# Patient Record
Sex: Female | Born: 1940
Health system: Southern US, Community
[De-identification: ages and names within clinical notes are randomized; demographics above are authoritative.]

## PROBLEM LIST (undated history)

## (undated) DIAGNOSIS — R17 Unspecified jaundice: Secondary | ICD-10-CM

## (undated) DIAGNOSIS — M109 Gout, unspecified: Secondary | ICD-10-CM

## (undated) DIAGNOSIS — N2889 Other specified disorders of kidney and ureter: Secondary | ICD-10-CM

## (undated) DIAGNOSIS — N183 Chronic kidney disease, stage 3 unspecified: Secondary | ICD-10-CM

## (undated) DIAGNOSIS — K5790 Diverticulosis of intestine, part unspecified, without perforation or abscess without bleeding: Secondary | ICD-10-CM

## (undated) DIAGNOSIS — C801 Malignant (primary) neoplasm, unspecified: Secondary | ICD-10-CM

## (undated) DIAGNOSIS — K648 Other hemorrhoids: Secondary | ICD-10-CM

## (undated) DIAGNOSIS — I219 Acute myocardial infarction, unspecified: Secondary | ICD-10-CM

## (undated) DIAGNOSIS — I2510 Atherosclerotic heart disease of native coronary artery without angina pectoris: Secondary | ICD-10-CM

## (undated) DIAGNOSIS — R112 Nausea with vomiting, unspecified: Secondary | ICD-10-CM

## (undated) DIAGNOSIS — Z8719 Personal history of other diseases of the digestive system: Secondary | ICD-10-CM

## (undated) DIAGNOSIS — Z862 Personal history of diseases of the blood and blood-forming organs and certain disorders involving the immune mechanism: Secondary | ICD-10-CM

## (undated) DIAGNOSIS — N840 Polyp of corpus uteri: Secondary | ICD-10-CM

## (undated) DIAGNOSIS — E785 Hyperlipidemia, unspecified: Secondary | ICD-10-CM

## (undated) DIAGNOSIS — M1990 Unspecified osteoarthritis, unspecified site: Secondary | ICD-10-CM

## (undated) DIAGNOSIS — G709 Myoneural disorder, unspecified: Secondary | ICD-10-CM

## (undated) DIAGNOSIS — M25511 Pain in right shoulder: Secondary | ICD-10-CM

## (undated) DIAGNOSIS — M25512 Pain in left shoulder: Secondary | ICD-10-CM

## (undated) DIAGNOSIS — N819 Female genital prolapse, unspecified: Secondary | ICD-10-CM

## (undated) DIAGNOSIS — I493 Ventricular premature depolarization: Secondary | ICD-10-CM

## (undated) DIAGNOSIS — N950 Postmenopausal bleeding: Secondary | ICD-10-CM

## (undated) DIAGNOSIS — K644 Residual hemorrhoidal skin tags: Secondary | ICD-10-CM

## (undated) DIAGNOSIS — Z923 Personal history of irradiation: Secondary | ICD-10-CM

## (undated) DIAGNOSIS — R569 Unspecified convulsions: Secondary | ICD-10-CM

## (undated) DIAGNOSIS — E119 Type 2 diabetes mellitus without complications: Secondary | ICD-10-CM

## (undated) DIAGNOSIS — Z9889 Other specified postprocedural states: Secondary | ICD-10-CM

## (undated) DIAGNOSIS — E559 Vitamin D deficiency, unspecified: Secondary | ICD-10-CM

## (undated) DIAGNOSIS — I10 Essential (primary) hypertension: Secondary | ICD-10-CM

## (undated) DIAGNOSIS — W57XXXA Bitten or stung by nonvenomous insect and other nonvenomous arthropods, initial encounter: Secondary | ICD-10-CM

## (undated) HISTORY — DX: Unspecified osteoarthritis, unspecified site: M19.90

## (undated) HISTORY — DX: Atherosclerotic heart disease of native coronary artery without angina pectoris: I25.10

## (undated) HISTORY — DX: Diverticulosis of intestine, part unspecified, without perforation or abscess without bleeding: K57.90

## (undated) HISTORY — DX: Essential (primary) hypertension: I10

## (undated) HISTORY — DX: Hereditary hemochromatosis: E83.110

## (undated) HISTORY — PX: LIPOMA EXCISION: SHX5283

## (undated) HISTORY — DX: Type 2 diabetes mellitus without complications: E11.9

## (undated) HISTORY — DX: Hyperlipidemia, unspecified: E78.5

## (undated) HISTORY — DX: Malignant (primary) neoplasm, unspecified: C80.1

## (undated) HISTORY — DX: Gout, unspecified: M10.9

## (undated) HISTORY — DX: Myoneural disorder, unspecified: G70.9

## (undated) HISTORY — PX: BREAST SURGERY: SHX581

---

## 1988-03-23 DIAGNOSIS — Z8719 Personal history of other diseases of the digestive system: Secondary | ICD-10-CM

## 1988-03-23 HISTORY — DX: Personal history of other diseases of the digestive system: Z87.19

## 1989-01-20 HISTORY — PX: UPPER GI ENDOSCOPY: SHX6162

## 2001-09-01 ENCOUNTER — Other Ambulatory Visit: Admission: RE | Admit: 2001-09-01 | Discharge: 2001-09-01 | Payer: Self-pay | Admitting: Family Medicine

## 2001-11-28 HISTORY — PX: COLONOSCOPY: SHX174

## 2003-09-10 ENCOUNTER — Ambulatory Visit (HOSPITAL_COMMUNITY): Admission: RE | Admit: 2003-09-10 | Discharge: 2003-09-10 | Payer: Self-pay | Admitting: Family Medicine

## 2004-12-17 ENCOUNTER — Other Ambulatory Visit: Admission: RE | Admit: 2004-12-17 | Discharge: 2004-12-17 | Payer: Self-pay | Admitting: Family Medicine

## 2005-03-23 DIAGNOSIS — I219 Acute myocardial infarction, unspecified: Secondary | ICD-10-CM

## 2005-03-23 HISTORY — DX: Acute myocardial infarction, unspecified: I21.9

## 2005-03-23 HISTORY — PX: CORONARY ARTERY BYPASS GRAFT: SHX141

## 2005-06-03 ENCOUNTER — Inpatient Hospital Stay (HOSPITAL_COMMUNITY): Admission: EM | Admit: 2005-06-03 | Discharge: 2005-06-09 | Payer: Self-pay | Admitting: *Deleted

## 2005-06-03 HISTORY — PX: CARDIAC CATHETERIZATION: SHX172

## 2005-06-29 ENCOUNTER — Encounter
Admission: RE | Admit: 2005-06-29 | Discharge: 2005-06-29 | Payer: Self-pay | Admitting: Thoracic Surgery (Cardiothoracic Vascular Surgery)

## 2005-07-13 ENCOUNTER — Encounter (HOSPITAL_COMMUNITY): Admission: RE | Admit: 2005-07-13 | Discharge: 2005-08-12 | Payer: Self-pay | Admitting: Cardiology

## 2005-08-19 ENCOUNTER — Encounter (HOSPITAL_COMMUNITY): Admission: RE | Admit: 2005-08-19 | Discharge: 2005-09-18 | Payer: Self-pay | Admitting: Cardiology

## 2005-09-21 ENCOUNTER — Encounter (HOSPITAL_COMMUNITY): Admission: RE | Admit: 2005-09-21 | Discharge: 2005-10-21 | Payer: Self-pay | Admitting: Cardiology

## 2011-05-27 ENCOUNTER — Ambulatory Visit: Payer: Medicare Other | Attending: Orthopedic Surgery | Admitting: Physical Therapy

## 2011-05-27 DIAGNOSIS — IMO0001 Reserved for inherently not codable concepts without codable children: Secondary | ICD-10-CM | POA: Insufficient documentation

## 2011-05-27 DIAGNOSIS — M25559 Pain in unspecified hip: Secondary | ICD-10-CM | POA: Insufficient documentation

## 2011-05-27 DIAGNOSIS — R5381 Other malaise: Secondary | ICD-10-CM | POA: Insufficient documentation

## 2011-05-28 ENCOUNTER — Ambulatory Visit: Payer: Medicare Other | Admitting: Physical Therapy

## 2011-06-01 ENCOUNTER — Ambulatory Visit: Payer: Medicare Other | Admitting: Physical Therapy

## 2011-06-09 ENCOUNTER — Ambulatory Visit: Payer: Medicare Other | Admitting: Physical Therapy

## 2011-06-11 ENCOUNTER — Ambulatory Visit: Payer: Medicare Other | Admitting: Physical Therapy

## 2011-06-16 ENCOUNTER — Ambulatory Visit: Payer: Medicare Other | Admitting: Physical Therapy

## 2011-06-18 ENCOUNTER — Ambulatory Visit: Payer: Medicare Other | Admitting: Physical Therapy

## 2011-06-22 ENCOUNTER — Ambulatory Visit: Payer: Medicare Other | Attending: Orthopedic Surgery | Admitting: Physical Therapy

## 2011-06-22 DIAGNOSIS — M25559 Pain in unspecified hip: Secondary | ICD-10-CM | POA: Insufficient documentation

## 2011-06-22 DIAGNOSIS — IMO0001 Reserved for inherently not codable concepts without codable children: Secondary | ICD-10-CM | POA: Insufficient documentation

## 2011-06-22 DIAGNOSIS — R5381 Other malaise: Secondary | ICD-10-CM | POA: Insufficient documentation

## 2011-06-25 ENCOUNTER — Ambulatory Visit: Payer: Medicare Other | Admitting: Physical Therapy

## 2011-06-29 ENCOUNTER — Ambulatory Visit: Payer: Medicare Other | Admitting: Physical Therapy

## 2011-07-01 ENCOUNTER — Ambulatory Visit: Payer: Medicare Other | Admitting: Physical Therapy

## 2011-07-06 ENCOUNTER — Ambulatory Visit: Payer: Medicare Other | Admitting: Physical Therapy

## 2011-07-08 ENCOUNTER — Ambulatory Visit: Payer: Medicare Other | Admitting: Physical Therapy

## 2011-07-09 ENCOUNTER — Encounter: Payer: Medicare Other | Admitting: Physical Therapy

## 2011-07-13 ENCOUNTER — Ambulatory Visit: Payer: Medicare Other | Admitting: Physical Therapy

## 2011-07-17 ENCOUNTER — Ambulatory Visit: Payer: Medicare Other | Admitting: Physical Therapy

## 2012-06-13 ENCOUNTER — Other Ambulatory Visit (INDEPENDENT_AMBULATORY_CARE_PROVIDER_SITE_OTHER): Payer: Medicare Other

## 2012-06-13 DIAGNOSIS — E1059 Type 1 diabetes mellitus with other circulatory complications: Secondary | ICD-10-CM

## 2012-06-13 DIAGNOSIS — E785 Hyperlipidemia, unspecified: Secondary | ICD-10-CM

## 2012-06-13 DIAGNOSIS — I10 Essential (primary) hypertension: Secondary | ICD-10-CM

## 2012-06-13 LAB — HEPATIC FUNCTION PANEL
ALT: 35 U/L (ref 0–35)
AST: 31 U/L (ref 0–37)
Albumin: 4.5 g/dL (ref 3.5–5.2)
Alkaline Phosphatase: 60 U/L (ref 39–117)
Bilirubin, Direct: 0.1 mg/dL (ref 0.0–0.3)
Indirect Bilirubin: 0.5 mg/dL (ref 0.0–0.9)
Total Bilirubin: 0.6 mg/dL (ref 0.3–1.2)
Total Protein: 7.5 g/dL (ref 6.0–8.3)

## 2012-06-13 LAB — BASIC METABOLIC PANEL
BUN: 23 mg/dL (ref 6–23)
CO2: 29 mEq/L (ref 19–32)
Calcium: 10.4 mg/dL (ref 8.4–10.5)
Chloride: 101 mEq/L (ref 96–112)
Creat: 1.5 mg/dL — ABNORMAL HIGH (ref 0.50–1.10)
Glucose, Bld: 142 mg/dL — ABNORMAL HIGH (ref 70–99)
Potassium: 4.1 mEq/L (ref 3.5–5.3)
Sodium: 140 mEq/L (ref 135–145)

## 2012-06-13 LAB — POCT GLYCOSYLATED HEMOGLOBIN (HGB A1C): Hemoglobin A1C: 6.2

## 2012-06-13 NOTE — Progress Notes (Signed)
Patient came in for labs only.

## 2012-06-14 LAB — NMR, LIPOPROFILE

## 2012-06-20 ENCOUNTER — Encounter: Payer: Self-pay | Admitting: Family Medicine

## 2012-06-20 ENCOUNTER — Ambulatory Visit (INDEPENDENT_AMBULATORY_CARE_PROVIDER_SITE_OTHER): Payer: Medicare Other | Admitting: Family Medicine

## 2012-06-20 VITALS — BP 151/79 | HR 60 | Temp 97.5°F | Ht 64.0 in | Wt 194.6 lb

## 2012-06-20 DIAGNOSIS — E1149 Type 2 diabetes mellitus with other diabetic neurological complication: Secondary | ICD-10-CM | POA: Insufficient documentation

## 2012-06-20 DIAGNOSIS — E119 Type 2 diabetes mellitus without complications: Secondary | ICD-10-CM

## 2012-06-20 DIAGNOSIS — E785 Hyperlipidemia, unspecified: Secondary | ICD-10-CM | POA: Insufficient documentation

## 2012-06-20 DIAGNOSIS — I2510 Atherosclerotic heart disease of native coronary artery without angina pectoris: Secondary | ICD-10-CM

## 2012-06-20 DIAGNOSIS — M129 Arthropathy, unspecified: Secondary | ICD-10-CM

## 2012-06-20 DIAGNOSIS — R599 Enlarged lymph nodes, unspecified: Secondary | ICD-10-CM

## 2012-06-20 DIAGNOSIS — R590 Localized enlarged lymph nodes: Secondary | ICD-10-CM

## 2012-06-20 DIAGNOSIS — I10 Essential (primary) hypertension: Secondary | ICD-10-CM

## 2012-06-20 DIAGNOSIS — I152 Hypertension secondary to endocrine disorders: Secondary | ICD-10-CM | POA: Insufficient documentation

## 2012-06-20 DIAGNOSIS — M109 Gout, unspecified: Secondary | ICD-10-CM | POA: Insufficient documentation

## 2012-06-20 DIAGNOSIS — E1159 Type 2 diabetes mellitus with other circulatory complications: Secondary | ICD-10-CM | POA: Insufficient documentation

## 2012-06-20 DIAGNOSIS — M1990 Unspecified osteoarthritis, unspecified site: Secondary | ICD-10-CM

## 2012-06-20 DIAGNOSIS — E559 Vitamin D deficiency, unspecified: Secondary | ICD-10-CM

## 2012-06-20 LAB — URIC ACID: Uric Acid, Serum: 5.7 mg/dL (ref 2.4–6.0)

## 2012-06-20 LAB — POCT CBC
Granulocyte percent: 59.7 %G (ref 37–80)
HCT, POC: 43.2 % (ref 37.7–47.9)
Hemoglobin: 14.6 g/dL (ref 12.2–16.2)
Lymph, poc: 4.3 — AB (ref 0.6–3.4)
MCH, POC: 31.4 pg — AB (ref 27–31.2)
MCHC: 33.9 g/dL (ref 31.8–35.4)
MCV: 92.8 fL (ref 80–97)
MPV: 9.6 fL (ref 0–99.8)
POC Granulocyte: 6.9 (ref 2–6.9)
POC LYMPH PERCENT: 36.9 %L (ref 10–50)
Platelet Count, POC: 139 10*3/uL — AB (ref 142–424)
RBC: 4.7 M/uL (ref 4.04–5.48)
RDW, POC: 13.8 %
WBC: 11.6 10*3/uL — AB (ref 4.6–10.2)

## 2012-06-20 LAB — POCT GLYCOSYLATED HEMOGLOBIN (HGB A1C): Hemoglobin A1C: 6.1

## 2012-06-20 MED ORDER — AMOXICILLIN 875 MG PO TABS: 875.0000 mg | ORAL_TABLET | Freq: Two times a day (BID) | ORAL | Status: DC

## 2012-06-20 NOTE — Progress Notes (Signed)
Patient ID: Carmen [name], female   DOB: 1940/08/18, 72 y.o.   MRN: [id number] SUBJECTIVE:   HPI: Patient comes in for routine 4 month followup she is intolerant to statin sequence and the Crestor and stopped it had to go to chiropractor from physical therapy because her muscles hurt so bad. She was on fenofibrate. She is willing to restart fenofibrate but not the statin. Coronary artery disease: No chest pain no shortness of breath no orthopnea. Hypertension no headache chest pain. Diabetes: Her sugars been running fairly satisfactory about 110-120 morning. Arthritis ongoing problem.  Past Medical History  Diagnosis Date  . Anemia   . Hypertension   . Arthritis   . Hyperlipidemia   . Diverticulosis   . Gout   . CAD (coronary artery disease)   . Diabetes mellitus without complication    Past Surgical History  Procedure Laterality Date  . Lipoma excision      back  . Breast surgery      left breast lump--benign  . Coronary artery bypass graft     History   Social History  . Marital Status: Married    Spouse Name: N/A    Number of Children: N/A  . Years of Education: N/A   Occupational History  . Not on file.   Social History Main Topics  . Smoking status: Never Smoker   . Smokeless tobacco: Not on file  . Alcohol Use: No  . Drug Use: No  . Sexually Active: Not on file   Other Topics Concern  . Not on file   Social History Narrative  . No narrative on file   History reviewed. No pertinent family history. No current outpatient prescriptions on file prior to visit.   No current facility-administered medications on file prior to visit.   Allergies  Allergen Reactions  . Crestor (Rosuvastatin)     Muscle aches    . Zocor (Simvastatin)     Muscle aches   . Sulfa Antibiotics Rash    There is no immunization history on file for this patient. Prior to Admission medications   Medication Sig Start Date End Date Taking? Authorizing Provider  allopurinol  (ZYLOPRIM) 100 MG tablet Take 100 mg by mouth 2 (two) times daily.  05/30/12  Yes Historical Provider, MD  DIOVAN 160 MG tablet Take 160 mg by mouth daily.  06/14/12  Yes Historical Provider, MD  fluticasone (FLONASE) 50 MCG/ACT nasal spray Place 2 sprays into the nose daily.  05/16/12  Yes Historical Provider, MD  JANUVIA 100 MG tablet Take 100 mg by mouth daily.  05/27/12  Yes Historical Provider, MD  LOVAZA 1 G capsule Take 1 g by mouth 2 (two) times daily.  05/14/12  Yes Historical Provider, MD  LYRICA 50 MG capsule Take 50 mg by mouth 2 (two) times daily.  05/27/12  Yes Historical Provider, MD  metoprolol tartrate (LOPRESSOR) 25 MG tablet Take 25 mg by mouth 2 (two) times daily.  04/10/12  Yes Historical Provider, MD  triamterene-hydrochlorothiazide (DYAZIDE) 37.5-25 MG per capsule Take 1 capsule by mouth daily.  06/14/12  Yes Historical Provider, MD   Review of systems as above  OBJECTIVE:    Patient in no acute distress. Obese VS:BP 151/79  Pulse 60  Temp(Src) 97.5 F (36.4 C) (Oral)  Ht 5\' 4"  (1.626 m)  Wt 194 lb 9.6 oz (88.27 kg)  BMI 33.39 kg/m2  SKIN: warm and  Dry without overt rashes. HEAD and Neck: without JVD, Normal except for one  cervical lymph node less than a centimeter on the left side of the neck very tender. Patient had recent dental work. LUNGS: Clear CVS: Regular rhythm, normal sounds, and absence of murmurs, rubs or gallops. ABDOMEN: Benign,, no organomegaly, no masses, no Abdominal Aortic enlargement. EXTREMETIES: nonedematous. NEUROLOGIC: oriented to time,place and person; nonfocal  ASSESSMENT:   HLD (hyperlipidemia) Patient is intolerant to statins. A bad muscle aches she could hardly get around. She and her going to the chiropractor for treatment and physical therapy. She refuses to take any Crestor. She will consider restarting fenofibrate. She has 2 months prescription for fenofibrate. Reviewed her labs with her t LDL particles was 3246. The LDL cholesterol was  165. HDL cholesterol was 34. The triglyceride was 393. The total cholesterol was 278. In view of her coronary artery disease and coronary artery bypass graft history she needs to be aggressive lowering with medications and aggressive dietary changes to lower the LDL cholesterol and triglyceride  DM (diabetes mellitus) Hemoglobin A1c was satisfactory.  HTN (hypertension) Blood pressure level  high patient claims that her blood pressure is usually normal at home  Gout Uric acid was not done on her labs will do that today  Arthritis She  gets arthritic joint aches and pains.   Acute cervical lymphadenitis. PLAN:  Results for orders placed in visit on 06/20/12  POCT CBC      Result Value Range   WBC 11.6 (*) 4.6 - 10.2 K/uL   Lymph, poc 4.3 (*) 0.6 - 3.4   POC LYMPH PERCENT 36.9  10 - 50 %L   MID (cbc)    0 - 0.9   POC MID %    0 - 12 %M   POC Granulocyte 6.9  2 - 6.9   Granulocyte percent 59.7  37 - 80 %G   RBC 4.7  4.04 - 5.48 M/uL   Hemoglobin 14.6  12.2 - 16.2 g/dL   HCT, POC 19.1  47.8 - 47.9 %   MCV 92.8  80 - 97 fL   MCH, POC 31.4 (*) 27 - 31.2 pg   MCHC 33.9  31.8 - 35.4 g/dL   RDW, POC 29.5     Platelet Count, POC 139.0 (*) 142 - 424 K/uL   MPV 9.6  0 - 99.8 fL   Orders Placed This Encounter  Procedures  . Uric acid  . POCT CBC   Meds ordered this encounter  Medications  . allopurinol (ZYLOPRIM) 100 MG tablet    Sig: Take 100 mg by mouth 2 (two) times daily.   . fluticasone (FLONASE) 50 MCG/ACT nasal spray    Sig: Place 2 sprays into the nose daily.   . metoprolol tartrate (LOPRESSOR) 25 MG tablet    Sig: Take 25 mg by mouth 2 (two) times daily.   Marland Kitchen LOVAZA 1 G capsule    Sig: Take 1 g by mouth 2 (two) times daily.   Marland Kitchen LYRICA 50 MG capsule    Sig: Take 50 mg by mouth 2 (two) times daily.   Marland Kitchen DISCONTD: CRESTOR 10 MG tablet    Sig:   . JANUVIA 100 MG tablet    Sig: Take 100 mg by mouth daily.   Marland Kitchen triamterene-hydrochlorothiazide (DYAZIDE) 37.5-25 MG per  capsule    Sig: Take 1 capsule by mouth daily.   Marland Kitchen DIOVAN 160 MG tablet    Sig: Take 160 mg by mouth daily.    Recommended for patient to restart fenofibrate. Will need to check  pharmacy in regards to what the actual doses. Diet and exercise recommended.  Lorilyn Laitinen P. [place] Charon, M.D. Addendum: Because of recent dental work and in lymphadenopathy in left side of her neck will call in amoxicillin 875 mg by mouth twice a day for 10 days. Prescription sent to the pharmacist to [facility]

## 2012-06-20 NOTE — Assessment & Plan Note (Signed)
Hemoglobin A1c was satisfactory.

## 2012-06-20 NOTE — Assessment & Plan Note (Signed)
Uric acid was not done on her labs will do that today

## 2012-06-20 NOTE — Assessment & Plan Note (Signed)
Blood pressure level  high patient claims that her blood pressure is usually normal at home

## 2012-06-20 NOTE — Assessment & Plan Note (Signed)
Patient is intolerant to statins. A bad muscle aches she could hardly get around. She and her going to the chiropractor for treatment and physical therapy. She refuses to take any Crestor. She will consider restarting fenofibrate. She has 2 months prescription for fenofibrate. Reviewed her labs with her t LDL particles was 3246. The LDL cholesterol was 165. HDL cholesterol was 34. The triglyceride was 393. The total cholesterol was 278. In view of her coronary artery disease and coronary artery bypass graft history she needs to be aggressive lowering with medications and aggressive dietary changes to lower the LDL cholesterol and triglyceride

## 2012-06-20 NOTE — Assessment & Plan Note (Signed)
She  gets arthritic joint aches and pains.

## 2012-06-21 ENCOUNTER — Telehealth: Payer: Self-pay

## 2012-06-21 NOTE — Telephone Encounter (Signed)
[facility] not[facility] and confirmed  pt is on fenfofibrate 54 mg

## 2012-06-21 NOTE — Progress Notes (Signed)
Quick Note:  Lab result at goal for the HGBA1C and the Uric acid. No change in Medications for now. The WBC was slightly elevated and reflects an infection, therefore the amoxil was prescribed for an infection from the dental problem and the swollen Lymph node. We should recheck the CBC in 2 weeks. ______

## 2012-07-04 ENCOUNTER — Other Ambulatory Visit (INDEPENDENT_AMBULATORY_CARE_PROVIDER_SITE_OTHER): Payer: Medicare Other

## 2012-07-04 DIAGNOSIS — R5383 Other fatigue: Secondary | ICD-10-CM

## 2012-07-04 DIAGNOSIS — R5381 Other malaise: Secondary | ICD-10-CM

## 2012-07-04 LAB — POCT CBC
Granulocyte percent: 51.4 %G (ref 37–80)
HCT, POC: 41.4 % (ref 37.7–47.9)
Hemoglobin: 13.6 g/dL (ref 12.2–16.2)
Lymph, poc: 4.1 — AB (ref 0.6–3.4)
MCH, POC: 30.1 pg (ref 27–31.2)
MCHC: 32.9 g/dL (ref 31.8–35.4)
MCV: 91.4 fL (ref 80–97)
MPV: 7.9 fL (ref 0–99.8)
POC Granulocyte: 4.8 (ref 2–6.9)
POC LYMPH PERCENT: 43.6 %L (ref 10–50)
Platelet Count, POC: 142 10*3/uL (ref 142–424)
RBC: 4.5 M/uL (ref 4.04–5.48)
RDW, POC: 13.9 %
WBC: 9.4 10*3/uL (ref 4.6–10.2)

## 2012-07-04 NOTE — Progress Notes (Signed)
Quick Note:  Call patient. Labs normal. No change in plan. ______ 

## 2012-07-19 ENCOUNTER — Other Ambulatory Visit: Payer: Self-pay | Admitting: *Deleted

## 2012-07-19 MED ORDER — OMEGA-3-ACID ETHYL ESTERS 1 G PO CAPS: ORAL_CAPSULE | ORAL | Status: DC

## 2012-07-28 ENCOUNTER — Telehealth: Payer: Self-pay | Admitting: Family Medicine

## 2012-07-28 NOTE — Telephone Encounter (Signed)
Pt aware labs sent to [facility]

## 2012-08-01 ENCOUNTER — Other Ambulatory Visit: Payer: Self-pay | Admitting: Family Medicine

## 2012-08-03 ENCOUNTER — Other Ambulatory Visit: Payer: Self-pay | Admitting: Nurse Practitioner

## 2012-08-05 ENCOUNTER — Other Ambulatory Visit: Payer: Self-pay | Admitting: Nurse Practitioner

## 2012-08-08 ENCOUNTER — Encounter: Payer: Self-pay | Admitting: Internal Medicine

## 2012-08-09 ENCOUNTER — Ambulatory Visit (INDEPENDENT_AMBULATORY_CARE_PROVIDER_SITE_OTHER): Payer: Medicare Other | Admitting: Internal Medicine

## 2012-08-09 ENCOUNTER — Encounter: Payer: Self-pay | Admitting: Internal Medicine

## 2012-08-09 VITALS — BP 148/88 | HR 64 | Ht 60.0 in | Wt 197.0 lb

## 2012-08-09 DIAGNOSIS — I493 Ventricular premature depolarization: Secondary | ICD-10-CM | POA: Insufficient documentation

## 2012-08-09 DIAGNOSIS — Z951 Presence of aortocoronary bypass graft: Secondary | ICD-10-CM | POA: Insufficient documentation

## 2012-08-09 DIAGNOSIS — E785 Hyperlipidemia, unspecified: Secondary | ICD-10-CM

## 2012-08-09 DIAGNOSIS — I2510 Atherosclerotic heart disease of native coronary artery without angina pectoris: Secondary | ICD-10-CM

## 2012-08-09 DIAGNOSIS — I10 Essential (primary) hypertension: Secondary | ICD-10-CM

## 2012-08-09 DIAGNOSIS — I4949 Other premature depolarization: Secondary | ICD-10-CM

## 2012-08-09 NOTE — Patient Instructions (Signed)
Your physician recommends that you schedule a follow-up appointment in: 1 year  

## 2012-08-09 NOTE — Progress Notes (Signed)
OFFICE NOTE  Chief Complaint:  Annual office visit  Primary Care Physician: Carmen Heap, MD  HPI:  Carmen [name] is a 72 year-old female comes in for followup of coronary disease. She had bypass surgery in 2007 and at that time had a 40% ejection fraction. She had a nuclear study in May of 2012 that was a low-risk study, but we could not calculate an ejection fraction because of ectopy. She has had no episodes of angina, no unusual shortness of breath unless she walks up a hill or a flight of steps, and then she becomes mildly breathless. She does not have PND or orthopnea. She does not have a productive cough. She has had no awareness of any arrhythmias, no dizziness, no syncope. She does have mild left ankle edema that is dependent in nature. She wears support hose infrequently for this. She has recurrent problems with gout, usually in her great toe. She has made some significant dietary changes, and the gout episodes seem to be less.  She has been diabetic for around 2 years. She has a hemoglobin A1c of 5.8, has had no significant episodes of hypoglycemia. She has noted some proximal lower extremity weakness. She gave an example working in her garden, when she tried to get up her legs were so weak that she had to get up on all fours, and one time her husband had to get her up. She has gone through physical therapy recently because of right hip bursitis, and it may very well be that she needs additional physical therapy for proximal muscle weakness and core strength building. No significant problems from the pollen. She has had some problems with constipation. Otherwise, no new complaints today.  PMHx:  Past Medical History  Diagnosis Date  . Anemia   . Hypertension   . Arthritis   . Hyperlipidemia   . Diverticulosis   . Gout   . Diabetes mellitus without complication   . CAD (coronary artery disease)     Past Surgical History  Procedure Laterality Date  . Lipoma excision     back  . Breast surgery      left breast lump--benign  . Cardiac catheterization  06/03/2005  . Coronary artery bypass graft  2007    x2    FAMHx:  History reviewed. No pertinent family history.  SOCHx:   reports that she has never smoked. She does not have any smokeless tobacco history on file. She reports that she does not drink alcohol or use illicit drugs.  ALLERGIES:  Allergies  Allergen Reactions  . Crestor (Rosuvastatin)     Muscle aches    . Zetia (Ezetimibe)   . Zocor (Simvastatin)     Muscle aches   . Sulfa Antibiotics Rash    ROS: A comprehensive review of systems was negative except for: Musculoskeletal: positive for back pain and muscle weakness  HOME MEDS: Current Outpatient Prescriptions  Medication Sig Dispense Refill  . allopurinol (ZYLOPRIM) 100 MG tablet Take 100 mg by mouth 2 (two) times daily.       Marland Kitchen aspirin 81 MG tablet Take 81 mg by mouth daily.      Marland Kitchen DIOVAN 160 MG tablet Take 160 mg by mouth daily.       . fenofibrate 54 MG tablet Take 54 mg by mouth daily.      . fluticasone (FLONASE) 50 MCG/ACT nasal spray USE 2 SPRAYS INTO EACH NOSTRIL DAILY  16 g  2  . JANUVIA 100 MG tablet Take  100 mg by mouth daily.       Marland Kitchen LYRICA 50 MG capsule TAKE 1 CAPSULE TWICE A DAY  60 capsule  2  . metoprolol tartrate (LOPRESSOR) 25 MG tablet Take 25 mg by mouth 2 (two) times daily.       Marland Kitchen omega-3 acid ethyl esters (LOVAZA) 1 G capsule Take 2 capsules twice a day  120 capsule  2  . triamterene-hydrochlorothiazide (DYAZIDE) 37.5-25 MG per capsule Take 1 capsule by mouth daily.       Marland Kitchen VITAMIN D, CHOLECALCIFEROL, PO Take 5,000 Units by mouth daily.      Marland Kitchen amoxicillin (AMOXIL) 875 MG tablet Take 1 tablet (875 mg total) by mouth 2 (two) times daily.  20 tablet  0   No current facility-administered medications for this visit.    LABS/IMAGING: No results found for this or any previous visit (from the past 48 hour(s)). No results found.  VITALS: BP 148/88  Pulse 64   Ht 5' (1.524 m)  Wt 197 lb (89.359 kg)  BMI 38.47 kg/m2  EXAM: General appearance: alert and no distress Neck: no adenopathy, no carotid bruit, no JVD, supple, symmetrical, trachea midline and thyroid not enlarged, symmetric, no tenderness/mass/nodules Lungs: clear to auscultation bilaterally Heart: regular rate and rhythm, S1, S2 normal, no murmur, click, rub or gallop Abdomen: soft, non-tender; bowel sounds normal; no masses,  no organomegaly Extremities: extremities normal, atraumatic, no cyanosis or edema Pulses: 2+ and symmetric Skin: Skin color, texture, turgor normal. No rashes or lesions Neurologic: Grossly normal  EKG: Sinus rhythm with occasional PVCs at 64  ASSESSMENT: 1. Coronary artery bypass grafting x2 vessels with LIMA to LAD and SVG to PDA in 2007 2. Asymptomatic PVCs 3. Hypertension-controlled 4. Dyslipidemia 5. Diabetes 6. Gout  PLAN: 1.   Overall Carmen [name] is doing fairly well. Her main complaint again continue around low back pain and sciatica and weakness in her legs. She underwent rehabilitation for this but she reports when bending over in her garden at times she feels unstable like she might fall. She denies any chest pain or worsening shortness of breath. She is unaware of her PVCs. We'll go ahead and check a lipid profile again today. Plan to see her back in the office annually or sooner as necessary.  Carmen Nose, MD, [facility] Attending Cardiologist [facility]  Carmen [name] 08/09/2012, 2:24 PM

## 2012-08-18 ENCOUNTER — Ambulatory Visit: Payer: Medicare Other | Admitting: Family Medicine

## 2012-08-22 ENCOUNTER — Other Ambulatory Visit: Payer: Self-pay | Admitting: Family Medicine

## 2012-09-18 ENCOUNTER — Other Ambulatory Visit: Payer: Self-pay | Admitting: Family Medicine

## 2012-09-19 ENCOUNTER — Other Ambulatory Visit: Payer: Self-pay | Admitting: *Deleted

## 2012-09-19 MED ORDER — SITAGLIPTIN PHOSPHATE 100 MG PO TABS: 100.0000 mg | ORAL_TABLET | Freq: Every day | ORAL | Status: DC

## 2012-09-19 NOTE — Telephone Encounter (Signed)
LAST OV 06/20/12 AND LAST AIC AT THIS APPT. THANKS.

## 2012-10-10 ENCOUNTER — Other Ambulatory Visit: Payer: Self-pay | Admitting: *Deleted

## 2012-10-10 ENCOUNTER — Other Ambulatory Visit: Payer: Self-pay | Admitting: Family Medicine

## 2012-10-10 MED ORDER — METOPROLOL TARTRATE 25 MG PO TABS: 25.0000 mg | ORAL_TABLET | Freq: Two times a day (BID) | ORAL | Status: DC

## 2012-10-11 ENCOUNTER — Telehealth: Payer: Self-pay | Admitting: Family Medicine

## 2012-10-11 NOTE — Telephone Encounter (Signed)
Left message for pt that labs will be drawn day of appt

## 2012-10-12 ENCOUNTER — Other Ambulatory Visit: Payer: Self-pay

## 2012-10-15 ENCOUNTER — Telehealth: Payer: Self-pay | Admitting: Family Medicine

## 2012-10-15 ENCOUNTER — Other Ambulatory Visit: Payer: Self-pay | Admitting: Nurse Practitioner

## 2012-10-15 MED ORDER — ALLOPURINOL 100 MG PO TABS: 100.0000 mg | ORAL_TABLET | Freq: Two times a day (BID) | ORAL | Status: DC

## 2012-10-15 NOTE — Telephone Encounter (Signed)
Med rf'd x 1 mo

## 2012-10-17 ENCOUNTER — Other Ambulatory Visit: Payer: Self-pay | Admitting: Family Medicine

## 2012-10-19 ENCOUNTER — Other Ambulatory Visit: Payer: Self-pay | Admitting: Nurse Practitioner

## 2012-10-20 ENCOUNTER — Ambulatory Visit (INDEPENDENT_AMBULATORY_CARE_PROVIDER_SITE_OTHER): Payer: Medicare Other | Admitting: Family Medicine

## 2012-10-20 ENCOUNTER — Encounter: Payer: Self-pay | Admitting: Family Medicine

## 2012-10-20 ENCOUNTER — Ambulatory Visit (INDEPENDENT_AMBULATORY_CARE_PROVIDER_SITE_OTHER): Payer: Medicare Other

## 2012-10-20 VITALS — BP 151/72 | HR 68 | Temp 97.2°F

## 2012-10-20 DIAGNOSIS — Z951 Presence of aortocoronary bypass graft: Secondary | ICD-10-CM

## 2012-10-20 DIAGNOSIS — R52 Pain, unspecified: Secondary | ICD-10-CM

## 2012-10-20 DIAGNOSIS — I493 Ventricular premature depolarization: Secondary | ICD-10-CM

## 2012-10-20 DIAGNOSIS — I4949 Other premature depolarization: Secondary | ICD-10-CM

## 2012-10-20 DIAGNOSIS — E119 Type 2 diabetes mellitus without complications: Secondary | ICD-10-CM

## 2012-10-20 DIAGNOSIS — M109 Gout, unspecified: Secondary | ICD-10-CM

## 2012-10-20 DIAGNOSIS — E785 Hyperlipidemia, unspecified: Secondary | ICD-10-CM

## 2012-10-20 DIAGNOSIS — I2510 Atherosclerotic heart disease of native coronary artery without angina pectoris: Secondary | ICD-10-CM

## 2012-10-20 DIAGNOSIS — I10 Essential (primary) hypertension: Secondary | ICD-10-CM

## 2012-10-20 DIAGNOSIS — M129 Arthropathy, unspecified: Secondary | ICD-10-CM

## 2012-10-20 DIAGNOSIS — Z8349 Family history of other endocrine, nutritional and metabolic diseases: Secondary | ICD-10-CM

## 2012-10-20 DIAGNOSIS — M1990 Unspecified osteoarthritis, unspecified site: Secondary | ICD-10-CM

## 2012-10-20 LAB — POCT GLYCOSYLATED HEMOGLOBIN (HGB A1C): Hemoglobin A1C: 6

## 2012-10-20 LAB — POCT CBC
Granulocyte percent: 60.6 %G (ref 37–80)
HCT, POC: 42.5 % (ref 37.7–47.9)
Hemoglobin: 14.1 g/dL (ref 12.2–16.2)
Lymph, poc: 3.8 — AB (ref 0.6–3.4)
MCH, POC: 30.4 pg (ref 27–31.2)
MCHC: 33.3 g/dL (ref 31.8–35.4)
MCV: 91.4 fL (ref 80–97)
MPV: 10.1 fL (ref 0–99.8)
POC Granulocyte: 6.5 (ref 2–6.9)
POC LYMPH PERCENT: 35.6 %L (ref 10–50)
Platelet Count, POC: 159 10*3/uL (ref 142–424)
RBC: 4.6 M/uL (ref 4.04–5.48)
RDW, POC: 13.7 %
WBC: 10.7 10*3/uL — AB (ref 4.6–10.2)

## 2012-10-20 LAB — POCT UA - MICROALBUMIN: Microalbumin Ur, POC: NEGATIVE mg/L

## 2012-10-20 MED ORDER — ROSUVASTATIN CALCIUM 5 MG PO TABS: 5.0000 mg | ORAL_TABLET | Freq: Every day | ORAL | Status: DC

## 2012-10-20 MED ORDER — TRIAMTERENE-HCTZ 37.5-25 MG PO CAPS: ORAL_CAPSULE | ORAL | Status: DC

## 2012-10-20 MED ORDER — VALSARTAN 160 MG PO TABS: ORAL_TABLET | ORAL | Status: DC

## 2012-10-20 MED ORDER — SITAGLIPTIN PHOSPHATE 100 MG PO TABS: 100.0000 mg | ORAL_TABLET | Freq: Every day | ORAL | Status: DC

## 2012-10-20 MED ORDER — COLCHICINE 0.6 MG PO TABS: 0.6000 mg | ORAL_TABLET | Freq: Every day | ORAL | Status: DC

## 2012-10-20 MED ORDER — ALLOPURINOL 100 MG PO TABS: 100.0000 mg | ORAL_TABLET | Freq: Two times a day (BID) | ORAL | Status: DC

## 2012-10-20 MED ORDER — FENOFIBRATE 120 MG PO TABS: 54.0000 mg | ORAL_TABLET | Freq: Every day | ORAL | Status: DC

## 2012-10-20 NOTE — Progress Notes (Signed)
Patient ID: Carmen [name], female   DOB: November 27, 1940, 72 y.o.   MRN: [id number] SUBJECTIVE: CC: Chief Complaint  Patient presents with  . Follow-up    4 month follow up c/o gout ck left ankle sore and painful  . Medication Refill    needs refills and wants colcrys     HPI: Patient is here for follow up of Diabetes Mellitus/hypertension/gout/CAD/hyperlipidemia Symptoms of DM: Denies Nocturia ,Denies Urinary Frequency , denies Blurred vision ,deniesDizziness,denies.Dysuria,denies paresthesias, denies extremity pain or ulcers.Marland Kitchendenies chest pain. has had an annual eye exam. do check the feet. Does check CBGs. Average CBG: Denies episodes of hypoglycemia. Does have an emergency hypoglycemic plan. admits toCompliance with medications. Denies Problems with medications.  Past Medical History  Diagnosis Date  . Anemia   . Hypertension   . Arthritis   . Hyperlipidemia   . Diverticulosis   . Gout   . Diabetes mellitus without complication   . CAD (coronary artery disease)    Past Surgical History  Procedure Laterality Date  . Lipoma excision      back  . Breast surgery      left breast lump--benign  . Cardiac catheterization  06/03/2005  . Coronary artery bypass graft  2007    x2   History   Social History  . Marital Status: Married    Spouse Name: N/A    Number of Children: N/A  . Years of Education: N/A   Occupational History  . Not on file.   Social History Main Topics  . Smoking status: Never Smoker   . Smokeless tobacco: Not on file  . Alcohol Use: No  . Drug Use: No  . Sexually Active: Not on file   Other Topics Concern  . Not on file   Social History Narrative  . No narrative on file   No family history on file. Current Outpatient Prescriptions on File Prior to Visit  Medication Sig Dispense Refill  . allopurinol (ZYLOPRIM) 100 MG tablet Take 1 tablet (100 mg total) by mouth 2 (two) times daily.  60 tablet  0  . aspirin 81 MG tablet Take 81 mg by  mouth daily.      Marland Kitchen DIOVAN 160 MG tablet TAKE 1 TABLET EVERY DAY  30 tablet  4  . fenofibrate 54 MG tablet Take 54 mg by mouth daily.      . fluticasone (FLONASE) 50 MCG/ACT nasal spray USE 2 SPRAYS INTO EACH NOSTRIL DAILY  16 g  2  . LYRICA 50 MG capsule TAKE 1 CAPSULE TWICE A DAY  60 capsule  2  . metoprolol tartrate (LOPRESSOR) 25 MG tablet Take 1 tablet (25 mg total) by mouth 2 (two) times daily.  180 tablet  3  . omega-3 acid ethyl esters (LOVAZA) 1 G capsule Take 2 capsules twice a day  120 capsule  2  . ONE TOUCH ULTRA TEST test strip USE FOR TESTING TWICE DAILY  100 each  1  . sitaGLIPtin (JANUVIA) 100 MG tablet Take 1 tablet (100 mg total) by mouth daily.  30 tablet  0  . triamterene-hydrochlorothiazide (DYAZIDE) 37.5-25 MG per capsule TAKE 1 CAPSULE BY MOUTH DAILY  30 capsule  2  . VITAMIN D, CHOLECALCIFEROL, PO Take 5,000 Units by mouth daily.       No current facility-administered medications on file prior to visit.   Allergies  Allergen Reactions  . Crestor (Rosuvastatin)     Muscle aches    . Zetia (Ezetimibe)   . Zocor (  Simvastatin)     Muscle aches   . Sulfa Antibiotics Rash    There is no immunization history on file for this patient. Prior to Admission medications   Medication Sig Start Date End Date Taking? Authorizing Provider  allopurinol (ZYLOPRIM) 100 MG tablet Take 1 tablet (100 mg total) by mouth 2 (two) times daily. 10/15/12  Yes Ileana Ladd, MD  aspirin 81 MG tablet Take 81 mg by mouth daily.   Yes Historical Provider, MD  CRESTOR 10 MG tablet 10 mg. Takes 3x week maybe 10/17/12  Yes Historical Provider, MD  DIOVAN 160 MG tablet TAKE 1 TABLET EVERY DAY 09/18/12  Yes Ernestina Penna, MD  fenofibrate 54 MG tablet Take 54 mg by mouth daily.   Yes Historical Provider, MD  fluticasone (FLONASE) 50 MCG/ACT nasal spray USE 2 SPRAYS INTO EACH NOSTRIL DAILY 08/22/12  Yes Ileana Ladd, MD  LYRICA 50 MG capsule TAKE 1 CAPSULE TWICE A DAY 08/03/12  Yes Ileana Ladd, MD   metoprolol tartrate (LOPRESSOR) 25 MG tablet Take 1 tablet (25 mg total) by mouth 2 (two) times daily. 10/10/12  Yes Chrystie Nose, MD  omega-3 acid ethyl esters (LOVAZA) 1 G capsule Take 2 capsules twice a day 07/19/12  Yes Ileana Ladd, MD  ONE TOUCH ULTRA TEST test strip USE FOR TESTING TWICE DAILY 10/17/12  Yes Ileana Ladd, MD  sitaGLIPtin (JANUVIA) 100 MG tablet Take 1 tablet (100 mg total) by mouth daily. 09/19/12  Yes Mary-Margaret Daphine Deutscher, FNP  triamterene-hydrochlorothiazide (DYAZIDE) 37.5-25 MG per capsule TAKE 1 CAPSULE BY MOUTH DAILY 10/10/12  Yes Ileana Ladd, MD  valsartan (DIOVAN) 160 MG tablet  10/17/12  Yes Historical Provider, MD  VITAMIN D, CHOLECALCIFEROL, PO Take 5,000 Units by mouth daily.   Yes Historical Provider, MD    ROS: As above in the HPI. All other systems are stable or negative.  OBJECTIVE: APPEARANCE:  Patient in no acute distress.The patient appeared well nourished and normally developed. Acyanotic. Waist: VITAL SIGNS:BP 151/72  Pulse 68  Temp(Src) 97.2 F (36.2 C) (Oral) WF  SKIN: warm and  Dry without overt rashes, tattoos and scars  HEAD and Neck: without JVD, Head and scalp: normal Eyes:No scleral icterus. Fundi normal, eye movements normal. Ears: Auricle normal, canal normal, Tympanic membranes normal, insufflation normal. Nose: normal Throat: normal Neck & thyroid: normal  CHEST & LUNGS: Chest wall: normal Lungs: Clear  CVS: Reveals the PMI to be normally located. Regular rhythm, First and Second Heart sounds are normal,  absence of murmurs, rubs or gallops. Peripheral vasculature: Radial pulses: normal Dorsal pedis pulses: normal Posterior pulses: normal  ABDOMEN:  Appearance: obese Benign, no organomegaly, no masses, no Abdominal Aortic enlargement. No Guarding , no rebound. No Bruits. Bowel sounds: normal  RECTAL: N/A GU: N/A  EXTREMETIES: nonedematous. Both Femoral and Pedal pulses are normal.  MUSCULOSKELETAL:   Spine: normal Joints: left ankle  Swollen and tender.  NEUROLOGIC: oriented to time,place and person; nonfocal. Strength is normal Sensory is normal Reflexes are normal Cranial Nerves are normal.  ASSESSMENT: Pain - Plan: DG Ankle Complete Left, POCT CBC  DM (diabetes mellitus) - Plan: sitaGLIPtin (JANUVIA) 100 MG tablet, POCT glycosylated hemoglobin (Hb A1C), CMP14+EGFR, POCT UA - Microalbumin  HLD (hyperlipidemia) - Plan: NMR, lipoprofile, CMP14+EGFR, rosuvastatin (CRESTOR) 5 MG tablet, fenofibrate 120 MG TABS  CAD (coronary artery disease)  Gout - Plan: colchicine 0.6 MG tablet, allopurinol (ZYLOPRIM) 100 MG tablet, Uric acid  HTN (hypertension) -  Plan: triamterene-hydrochlorothiazide (DYAZIDE) 37.5-25 MG per capsule, valsartan (DIOVAN) 160 MG tablet  Arthritis  S/P CABG x 2  PVC's (premature ventricular contractions)  FH: hemochromatosis - Plan: Hemochromatosis DNA-PCR(c282y,h63d)  BP not at goal and patient intolerant to statins has significant aches and has been noncompliant with it.  PLAN: Orders Placed This Encounter  Procedures  . DG Ankle Complete Left    Standing Status: Future     Number of Occurrences: 1     Standing Expiration Date: 12/20/2013    Order Specific Question:  Reason for Exam (SYMPTOM  OR DIAGNOSIS REQUIRED)    Answer:  pain    Order Specific Question:  Preferred imaging location?    Answer:  Internal  . NMR, lipoprofile  . CMP14+EGFR  . Hemochromatosis DNA-PCR(c282y,h63d)  . Uric acid  . POCT CBC  . POCT glycosylated hemoglobin (Hb A1C)  . POCT UA - Microalbumin    Meds ordered this encounter  Medications  . DISCONTD: CRESTOR 10 MG tablet    Sig: 10 mg. Takes 3x week maybe  . valsartan (DIOVAN) 160 MG tablet    Sig:   . colchicine 0.6 MG tablet    Sig: Take 1 tablet (0.6 mg total) by mouth daily.    Dispense:  30 tablet    Refill:  3  . sitaGLIPtin (JANUVIA) 100 MG tablet    Sig: Take 1 tablet (100 mg total) by mouth daily.     Dispense:  30 tablet    Refill:  11    ntbs  . allopurinol (ZYLOPRIM) 100 MG tablet    Sig: Take 1 tablet (100 mg total) by mouth 2 (two) times daily.    Dispense:  60 tablet    Refill:  11  . triamterene-hydrochlorothiazide (DYAZIDE) 37.5-25 MG per capsule    Sig: TAKE 1 CAPSULE BY MOUTH DAILY    Dispense:  30 capsule    Refill:  11  . valsartan (DIOVAN) 160 MG tablet    Sig: TAKE 1 TABLET EVERY DAY    Dispense:  30 tablet    Refill:  11  . rosuvastatin (CRESTOR) 5 MG tablet    Sig: Take 1 tablet (5 mg total) by mouth daily. Takes 3x week maybe    Dispense:  30 tablet    Refill:  11  . fenofibrate 120 MG TABS    Sig: Take 0.45 tablets (54 mg total) by mouth daily.    Dispense:  20 tablet    Refill:  11   [facility] reading (PRIMARY) by  Dr. Alvira Monday degenerative changes.                               :        Dr Woodroe Mode Recommendations  Diet and Exercise discussed with patient.  For nutrition information, I recommend books:  1).Eat to Live by Dr Monico Hoar. 2).Prevent and Reverse Heart Disease by Dr Suzzette Righter. 3) Dr Katherina Right Book:  Program to Reverse Diabetes  Exercise recommendations are:  If unable to walk, then the patient can exercise in a chair 3 times a day. By flapping arms like a bird gently and raising legs outwards to the front.  If ambulatory, the patient can go for walks for 30 minutes 3 times a week. Then increase the intensity and duration as tolerated.  Goal is to try to attain exercise frequency to 5 times a week.  If applicable: Best to  perform resistance exercises (machines or weights) 2 days a week and cardio type exercises 3 days per week.  Return in about 4 weeks (around 11/17/2012) for recheck BP.  Guillaume Weninger P. [place] Charon, M.D.

## 2012-10-20 NOTE — Patient Instructions (Addendum)
      Dr Sarinah Doetsch's Recommendations  Diet and Exercise discussed with patient.  For nutrition information, I recommend books:  1).Eat to Live by Dr Joel Fuhrman. 2).Prevent and Reverse Heart Disease by Dr Caldwell Esselstyn. 3) Dr Neal Barnard's Book:  Program to Reverse Diabetes  Exercise recommendations are:  If unable to walk, then the patient can exercise in a chair 3 times a day. By flapping arms like a bird gently and raising legs outwards to the front.  If ambulatory, the patient can go for walks for 30 minutes 3 times a week. Then increase the intensity and duration as tolerated.  Goal is to try to attain exercise frequency to 5 times a week.  If applicable: Best to perform resistance exercises (machines or weights) 2 days a week and cardio type exercises 3 days per week.  

## 2012-10-21 ENCOUNTER — Other Ambulatory Visit: Payer: Self-pay | Admitting: Family Medicine

## 2012-10-21 ENCOUNTER — Other Ambulatory Visit: Payer: Self-pay | Admitting: *Deleted

## 2012-10-26 ENCOUNTER — Other Ambulatory Visit: Payer: Self-pay

## 2012-10-26 LAB — CMP14+EGFR
ALT: 46 IU/L — ABNORMAL HIGH (ref 0–32)
AST: 50 IU/L — ABNORMAL HIGH (ref 0–40)
Albumin/Globulin Ratio: 2 (ref 1.1–2.5)
Albumin: 5 g/dL — ABNORMAL HIGH (ref 3.5–4.8)
Alkaline Phosphatase: 63 IU/L (ref 39–117)
BUN/Creatinine Ratio: 15 (ref 11–26)
BUN: 22 mg/dL (ref 8–27)
CO2: 27 mmol/L (ref 18–29)
Calcium: 11.3 mg/dL — ABNORMAL HIGH (ref 8.6–10.2)
Chloride: 100 mmol/L (ref 97–108)
Creatinine, Ser: 1.49 mg/dL — ABNORMAL HIGH (ref 0.57–1.00)
GFR calc Af Amer: 40 mL/min/{1.73_m2} — ABNORMAL LOW (ref >59)
GFR calc non Af Amer: 35 mL/min/{1.73_m2} — ABNORMAL LOW (ref >59)
Globulin, Total: 2.5 g/dL (ref 1.5–4.5)
Glucose: 100 mg/dL — ABNORMAL HIGH (ref 65–99)
Potassium: 4.7 mmol/L (ref 3.5–5.2)
Sodium: 142 mmol/L (ref 134–144)
Total Bilirubin: 0.3 mg/dL (ref 0.0–1.2)
Total Protein: 7.5 g/dL (ref 6.0–8.5)

## 2012-10-26 LAB — NMR, LIPOPROFILE
Cholesterol: 230 mg/dL — ABNORMAL HIGH (ref <200)
HDL Cholesterol by NMR: 38 mg/dL — ABNORMAL LOW (ref >=40)
HDL Particle Number: 25.8 umol/L — ABNORMAL LOW (ref >=30.5)
LDL Particle Number: 2518 nmol/L — ABNORMAL HIGH (ref <1000)
LDL Size: 20 nm — ABNORMAL LOW (ref >20.5)
LDLC SERPL CALC-MCNC: 120 mg/dL — ABNORMAL HIGH (ref <100)
LP-IR Score: 78 — ABNORMAL HIGH (ref <=45)
Small LDL Particle Number: 1783 nmol/L — ABNORMAL HIGH (ref <=527)
Triglycerides by NMR: 362 mg/dL — ABNORMAL HIGH (ref <150)

## 2012-10-26 LAB — HEMOCHROMATOSIS DNA-PCR(C282Y,H63D)

## 2012-10-26 LAB — URIC ACID: Uric Acid: 6.3 mg/dL (ref 2.5–7.1)

## 2012-10-27 NOTE — Progress Notes (Signed)
Quick Note:  Labs abnormal. Liver transaminases still elevated Calcium elevated Hemachromatosis test detected the gene and her offspring will be carriers. Lipids are not at goal.too high. Needs a Ionized calcium and PTH. Plus a needs an office visit for follow up to discuss results and next steps.   ______

## 2012-10-28 ENCOUNTER — Telehealth: Payer: Self-pay | Admitting: Family Medicine

## 2012-10-28 ENCOUNTER — Other Ambulatory Visit: Payer: Self-pay | Admitting: Family Medicine

## 2012-10-31 ENCOUNTER — Ambulatory Visit (INDEPENDENT_AMBULATORY_CARE_PROVIDER_SITE_OTHER): Payer: Medicare Other | Admitting: Family Medicine

## 2012-10-31 ENCOUNTER — Other Ambulatory Visit: Payer: Self-pay | Admitting: Family Medicine

## 2012-10-31 ENCOUNTER — Encounter: Payer: Self-pay | Admitting: Family Medicine

## 2012-10-31 VITALS — BP 154/68 | HR 63 | Temp 97.1°F

## 2012-10-31 DIAGNOSIS — M109 Gout, unspecified: Secondary | ICD-10-CM

## 2012-10-31 DIAGNOSIS — E119 Type 2 diabetes mellitus without complications: Secondary | ICD-10-CM

## 2012-10-31 DIAGNOSIS — Z951 Presence of aortocoronary bypass graft: Secondary | ICD-10-CM

## 2012-10-31 DIAGNOSIS — R6889 Other general symptoms and signs: Secondary | ICD-10-CM

## 2012-10-31 DIAGNOSIS — M1990 Unspecified osteoarthritis, unspecified site: Secondary | ICD-10-CM

## 2012-10-31 DIAGNOSIS — I2510 Atherosclerotic heart disease of native coronary artery without angina pectoris: Secondary | ICD-10-CM

## 2012-10-31 DIAGNOSIS — E785 Hyperlipidemia, unspecified: Secondary | ICD-10-CM

## 2012-10-31 DIAGNOSIS — I10 Essential (primary) hypertension: Secondary | ICD-10-CM

## 2012-10-31 DIAGNOSIS — R899 Unspecified abnormal finding in specimens from other organs, systems and tissues: Secondary | ICD-10-CM

## 2012-10-31 DIAGNOSIS — M129 Arthropathy, unspecified: Secondary | ICD-10-CM

## 2012-10-31 MED ORDER — AMLODIPINE BESYLATE 2.5 MG PO TABS: 2.5000 mg | ORAL_TABLET | Freq: Every day | ORAL | Status: DC

## 2012-10-31 MED ORDER — ALLOPURINOL 300 MG PO TABS: 100.0000 mg | ORAL_TABLET | Freq: Every day | ORAL | Status: DC

## 2012-10-31 NOTE — Telephone Encounter (Signed)
Appt scheduled for this afternoon for patient to discuss labs and have additional lab work.

## 2012-10-31 NOTE — Patient Instructions (Addendum)
      Dr Carlito Bogert's Recommendations  Diet and Exercise discussed with patient.  For nutrition information, I recommend books:  1).Eat to Live by Dr Joel Fuhrman. 2).Prevent and Reverse Heart Disease by Dr Caldwell Esselstyn. 3) Dr Neal Barnard's Book:  Program to Reverse Diabetes  Exercise recommendations are:  If unable to walk, then the patient can exercise in a chair 3 times a day. By flapping arms like a bird gently and raising legs outwards to the front.  If ambulatory, the patient can go for walks for 30 minutes 3 times a week. Then increase the intensity and duration as tolerated.  Goal is to try to attain exercise frequency to 5 times a week.  If applicable: Best to perform resistance exercises (machines or weights) 2 days a week and cardio type exercises 3 days per week.  

## 2012-10-31 NOTE — Progress Notes (Signed)
Patient ID: Carmen [name], female   DOB: 1940-05-28, 72 y.o.   MRN: [id number] SUBJECTIVE: CC: Chief Complaint  Patient presents with  . Follow-up    discuss labs burning in upper stomach after taking fenfobibrate and colcrys started taking zantac and felt  better. not taking acolcrys or fenfofibrate  . Medication Refill    refill lyrica  . Medication Problem    read on computer that colcrys and fenfobrate taking them is dangerous    HPI: Breakfast : 2% milk cheerios and coffee and a banana Lunch: bowl of vegetable soups and crackers, water Supper: vegetable soup    Patient is here for follow up of hyperlipidemia/HTN/DM/Gout denies Headache;denies Chest Pain;denies weakness;denies Shortness of Breath and orthopnea;denies Visual changes;denies palpitations;denies cough;denies pedal edema;denies symptoms of TIA or stroke;deniesClaudication symptoms. admits to Compliance with medications; Problems with medications: dyspepsia with the combination of fenofibrate and colcrys and her body aches with fenofibrate. Wants to change medications.  Loves to eat breads. Even has a bread maker machine though she hasn't used it for years.    Past Medical History  Diagnosis Date  . Anemia   . Hypertension   . Arthritis   . Hyperlipidemia   . Diverticulosis   . Gout   . Diabetes mellitus without complication   . CAD (coronary artery disease)    Past Surgical History  Procedure Laterality Date  . Lipoma excision      back  . Breast surgery      left breast lump--benign  . Cardiac catheterization  06/03/2005  . Coronary artery bypass graft  2007    x2   History   Social History  . Marital Status: Married    Spouse Name: N/A    Number of Children: N/A  . Years of Education: N/A   Occupational History  . Not on file.   Social History Main Topics  . Smoking status: Never Smoker   . Smokeless tobacco: Not on file  . Alcohol Use: No  . Drug Use: No  . Sexually Active: Not on  file   Other Topics Concern  . Not on file   Social History Narrative  . No narrative on file   No family history on file. Current Outpatient Prescriptions on File Prior to Visit  Medication Sig Dispense Refill  . allopurinol (ZYLOPRIM) 100 MG tablet Take 1 tablet (100 mg total) by mouth 2 (two) times daily.  60 tablet  11  . aspirin 81 MG tablet Take 81 mg by mouth daily.      . fenofibrate 120 MG TABS Take 0.45 tablets (54 mg total) by mouth daily.  20 tablet  11  . fluticasone (FLONASE) 50 MCG/ACT nasal spray USE 2 SPRAYS INTO EACH NOSTRIL DAILY  16 g  2  . LYRICA 50 MG capsule TAKE 1 CAPSULE TWICE A DAY  60 capsule  2  . metoprolol tartrate (LOPRESSOR) 25 MG tablet TAKE 1 TABLET TWICE A DAY  180 tablet  5  . omega-3 acid ethyl esters (LOVAZA) 1 G capsule Take 2 capsules twice a day  120 capsule  2  . ONE TOUCH ULTRA TEST test strip USE FOR TESTING TWICE DAILY  100 each  1  . rosuvastatin (CRESTOR) 5 MG tablet Take 1 tablet (5 mg total) by mouth daily. Takes 3x week maybe  30 tablet  11  . sitaGLIPtin (JANUVIA) 100 MG tablet Take 1 tablet (100 mg total) by mouth daily.  30 tablet  11  . triamterene-hydrochlorothiazide (  DYAZIDE) 37.5-25 MG per capsule TAKE 1 CAPSULE BY MOUTH DAILY  30 capsule  11  . VITAMIN D, CHOLECALCIFEROL, PO Take 5,000 Units by mouth daily.      . colchicine 0.6 MG tablet Take 1 tablet (0.6 mg total) by mouth daily.  30 tablet  3  . valsartan (DIOVAN) 160 MG tablet TAKE 1 TABLET EVERY DAY  30 tablet  11   No current facility-administered medications on file prior to visit.   Allergies  Allergen Reactions  . Crestor (Rosuvastatin)     Muscle aches    . Zetia (Ezetimibe)   . Zocor (Simvastatin)     Muscle aches   . Sulfa Antibiotics Rash    There is no immunization history on file for this patient. Prior to Admission medications   Medication Sig Start Date End Date Taking? Authorizing Provider  allopurinol (ZYLOPRIM) 100 MG tablet Take 1 tablet (100 mg  total) by mouth 2 (two) times daily. 10/20/12  Yes Ileana Ladd, MD  aspirin 81 MG tablet Take 81 mg by mouth daily.   Yes Historical Provider, MD  fenofibrate 120 MG TABS Take 0.45 tablets (54 mg total) by mouth daily. 10/20/12  Yes Ileana Ladd, MD  fluticasone [facility]) 50 MCG/ACT nasal spray USE 2 SPRAYS INTO EACH NOSTRIL DAILY 10/28/12  Yes Ernestina Penna, MD  LYRICA 50 MG capsule TAKE 1 CAPSULE TWICE A DAY 08/03/12  Yes Ileana Ladd, MD  metoprolol tartrate (LOPRESSOR) 25 MG tablet TAKE 1 TABLET TWICE A DAY 10/21/12  Yes Ileana Ladd, MD  omega-3 acid ethyl esters (LOVAZA) 1 G capsule Take 2 capsules twice a day 07/19/12  Yes Ileana Ladd, MD  ONE TOUCH ULTRA TEST test strip USE FOR TESTING TWICE DAILY 10/17/12  Yes Ileana Ladd, MD  rosuvastatin (CRESTOR) 5 MG tablet Take 1 tablet (5 mg total) by mouth daily. Takes 3x week maybe 10/20/12  Yes Ileana Ladd, MD  sitaGLIPtin (JANUVIA) 100 MG tablet Take 1 tablet (100 mg total) by mouth daily. 10/20/12  Yes Ileana Ladd, MD  triamterene-hydrochlorothiazide (DYAZIDE) 37.5-25 MG per capsule TAKE 1 CAPSULE BY MOUTH DAILY 10/20/12  Yes Ileana Ladd, MD  VITAMIN D, CHOLECALCIFEROL, PO Take 5,000 Units by mouth daily.   Yes Historical Provider, MD  colchicine 0.6 MG tablet Take 1 tablet (0.6 mg total) by mouth daily. 10/20/12   Ileana Ladd, MD  valsartan (DIOVAN) 160 MG tablet TAKE 1 TABLET EVERY DAY 10/20/12   Ileana Ladd, MD     ROS: As above in the HPI. All other systems are stable or negative.  OBJECTIVE: APPEARANCE:  Patient in no acute distress.The patient appeared well nourished and normally developed. Acyanotic. Waist: VITAL SIGNS:BP 154/68  Pulse 63  Temp(Src) 97.1 F (36.2 C) (Oral) WF Obese   SKIN: warm and  Dry without overt rashes, tattoos and scars  HEAD and Neck: without JVD, Head and scalp: normal Eyes:No scleral icterus. Fundi normal, eye movements normal. Ears: Auricle normal, canal normal, Tympanic  membranes normal, insufflation normal. Nose: normal Throat: normal Neck & thyroid: normal  CHEST & LUNGS: Chest wall: normal Lungs: Clear  CVS: Reveals the PMI to be normally located. Regular rhythm, First and Second Heart sounds are normal,  absence of murmurs, rubs or gallops. Peripheral vasculature: Radial pulses: normal Dorsal pedis pulses: normal Posterior pulses: normal  ABDOMEN:  Appearance: Obese Benign, no organomegaly, no masses, no Abdominal Aortic enlargement. No Guarding , no rebound.  No Bruits. Bowel sounds: normal  RECTAL: N/A GU: N/A  EXTREMETIES: nonedematous. Both Femoral and Pedal pulses are normal.  MUSCULOSKELETAL:  Spine: normal Joints: intact  NEUROLOGIC: oriented to time,place and person; nonfocal.  Results for orders placed in visit on 10/20/12  NMR, LIPOPROFILE      Result Value Range   LDL Particle Number [id number] (*) <1000 nmol/L   LDLC SERPL CALC-MCNC 120 (*) <100 mg/dL   HDL Cholesterol by NMR 38 (*) >=40 mg/dL   Triglycerides by NMR 362 (*) <150 mg/dL   Cholesterol 161 (*) <096 mg/dL   HDL Particle Number [id number] (*) >=30.5 umol/L   Small LDL Particle Number [id number] (*) <=527 nmol/L   LDL Size 20.0 (*) >20.5 nm   LP-IR Score 78 (*) <=45  CMP14+EGFR      Result Value Range   Glucose 100 (*) 65 - 99 mg/dL   BUN 22  8 - 27 mg/dL   Creatinine, Ser 4.09 (*) 0.57 - 1.00 mg/dL   GFR calc non Af Amer 35 (*) >59 mL/min/1.73   GFR calc Af Amer 40 (*) >59 mL/min/1.73   BUN/Creatinine Ratio 15  11 - 26   Sodium 142  134 - 144 mmol/L   Potassium 4.7  3.5 - 5.2 mmol/L   Chloride 100  97 - 108 mmol/L   CO2 27  18 - 29 mmol/L   Calcium 11.3 (*) 8.6 - 10.2 mg/dL   Total Protein 7.5  6.0 - 8.5 g/dL   Albumin 5.0 (*) 3.5 - 4.8 g/dL   Globulin, Total 2.5  1.5 - 4.5 g/dL   Albumin/Globulin Ratio 2.0  1.1 - 2.5   Total Bilirubin 0.3  0.0 - 1.2 mg/dL   Alkaline Phosphatase 63  39 - 117 IU/L   AST 50 (*) 0 - 40 IU/L   ALT 46 (*) 0 - 32 IU/L   HEMOCHROMATOSIS DNA-PCR(C282Y,H63D)      Result Value Range   Hemochromatosis Gene Comment    URIC ACID      Result Value Range   Uric Acid 6.3  2.5 - 7.1 mg/dL  POCT CBC      Result Value Range   WBC 10.7 (*) 4.6 - 10.2 K/uL   Lymph, poc 3.8 (*) 0.6 - 3.4   POC LYMPH PERCENT 35.6  10 - 50 %L   POC Granulocyte 6.5  2 - 6.9   Granulocyte percent 60.6  37 - 80 %G   RBC 4.6  4.04 - 5.48 M/uL   Hemoglobin 14.1  12.2 - 16.2 g/dL   HCT, POC 81.1  91.4 - 47.9 %   MCV 91.4  80 - 97 fL   MCH, POC 30.4  27 - 31.2 pg   MCHC 33.3  31.8 - 35.4 g/dL   RDW, POC 78.2     Platelet Count, POC 159.0  142 - 424 K/uL   MPV 10.1  0 - 99.8 fL  POCT GLYCOSYLATED HEMOGLOBIN (HGB A1C)      Result Value Range   Hemoglobin A1C 6.0 %    POCT UA - MICROALBUMIN      Result Value Range   Microalbumin Ur, POC negative      ASSESSMENT: CAD (coronary artery disease)  DM (diabetes mellitus)  HLD (hyperlipidemia)  Arthritis  Gout - Plan: allopurinol (ZYLOPRIM) 300 MG tablet  HTN (hypertension)  S/P CABG x 2  Abnormal laboratory test - Plan: Ambulatory referral to Hematology / Oncology, CBC With differential/Platelet, Ferritin, Thyroid Panel With  TSH, CMP14+EGFR, PTH, Intact and Calcium  Suspect carrier for the hemochromatosis genes. We discussed at length. Intolerant  To statins and fenofibrate.  PLAN: Orders Placed This Encounter  Procedures  . CBC With differential/Platelet  . Ferritin  . Thyroid Panel With TSH  . CMP14+EGFR  . PTH, Intact and Calcium  . Ambulatory referral to Hematology / Oncology    Referral Priority:  Routine    Referral Type:  Consultation    Referral Reason:  Specialty Services Required    Number of Visits Requested:  1    Meds ordered this encounter  Medications  . allopurinol (ZYLOPRIM) 300 MG tablet    Sig: Take 0.5 tablets (150 mg total) by mouth daily.    Dispense:  30 tablet    Refill:  11  . amLODipine (NORVASC) 2.5 MG tablet    Sig: Take 1 tablet  (2.5 mg total) by mouth daily.    Dispense:  30 tablet    Refill:  3         Dr Woodroe Mode Recommendations  Diet and Exercise discussed with patient.  For nutrition information, I recommend books:  1).Eat to Live by Dr Monico Hoar. 2).Prevent and Reverse Heart Disease by Dr Suzzette Righter. 3) Dr Katherina Right Book:  Program to Reverse Diabetes  Exercise recommendations are:  If unable to walk, then the patient can exercise in a chair 3 times a day. By flapping arms like a bird gently and raising legs outwards to the front.  If ambulatory, the patient can go for walks for 30 minutes 3 times a week. Then increase the intensity and duration as tolerated.  Goal is to try to attain exercise frequency to 5 times a week.  If applicable: Best to perform resistance exercises (machines or weights) 2 days a week and cardio type exercises 3 days per week.   Emphasize aggressive dietary interventions.  Return in about 4 weeks (around 11/28/2012) for Recheck medical problems.  Leaha Cuervo P. [place] Charon, M.D.

## 2012-11-01 LAB — CMP14+EGFR
ALT: 79 IU/L — ABNORMAL HIGH (ref 0–32)
AST: 63 IU/L — ABNORMAL HIGH (ref 0–40)
Albumin/Globulin Ratio: 1.7 (ref 1.1–2.5)
Albumin: 4.8 g/dL (ref 3.5–4.8)
Alkaline Phosphatase: 65 IU/L (ref 39–117)
BUN/Creatinine Ratio: 15 (ref 11–26)
BUN: 24 mg/dL (ref 8–27)
CO2: 30 mmol/L — ABNORMAL HIGH (ref 18–29)
Calcium: 11.2 mg/dL — ABNORMAL HIGH (ref 8.6–10.2)
Chloride: 99 mmol/L (ref 97–108)
Creatinine, Ser: 1.62 mg/dL — ABNORMAL HIGH (ref 0.57–1.00)
GFR calc Af Amer: 36 mL/min/{1.73_m2} — ABNORMAL LOW (ref >59)
GFR calc non Af Amer: 31 mL/min/{1.73_m2} — ABNORMAL LOW (ref >59)
Globulin, Total: 2.9 g/dL (ref 1.5–4.5)
Glucose: 113 mg/dL — ABNORMAL HIGH (ref 65–99)
Potassium: 4.5 mmol/L (ref 3.5–5.2)
Sodium: 143 mmol/L (ref 134–144)
Total Bilirubin: 0.3 mg/dL (ref 0.0–1.2)
Total Protein: 7.7 g/dL (ref 6.0–8.5)

## 2012-11-01 LAB — CBC WITH DIFFERENTIAL

## 2012-11-01 LAB — FERRITIN: Ferritin: 347 ng/mL — ABNORMAL HIGH (ref 15–150)

## 2012-11-01 LAB — THYROID PANEL WITH TSH
Free Thyroxine Index: 1.9 (ref 1.2–4.9)
T3 Uptake Ratio: 26 % (ref 24–39)
T4, Total: 7.2 ug/dL (ref 4.5–12.0)
TSH: 2.76 u[IU]/mL (ref 0.450–4.500)

## 2012-11-01 LAB — PTH, INTACT AND CALCIUM: PTH: 21 pg/mL (ref 15–65)

## 2012-11-02 ENCOUNTER — Telehealth: Payer: Self-pay | Admitting: Hematology & Oncology

## 2012-11-02 NOTE — Progress Notes (Signed)
Quick Note:  Labs abnormal. Ferritin is still high. The liver enzymes is still high. The calcium is high. Stop all calcium. Need a ionized calcium please. Lab to order please. Needs to see Hematology because of the positive hemochromatosis tests.   ______

## 2012-11-02 NOTE — Telephone Encounter (Signed)
Pt aware of 9-4 appointment °

## 2012-11-02 NOTE — Telephone Encounter (Signed)
Pt was here for her lyrica refill on 11/01/12 and it wasn't done! It needs to be called in asap [place] Charon)

## 2012-11-04 ENCOUNTER — Other Ambulatory Visit: Payer: Self-pay | Admitting: Family Medicine

## 2012-11-04 MED ORDER — PREGABALIN 50 MG PO CAPS: ORAL_CAPSULE | ORAL | Status: DC

## 2012-11-04 NOTE — Telephone Encounter (Signed)
Left on voicemail

## 2012-11-22 ENCOUNTER — Encounter: Payer: Self-pay | Admitting: Family Medicine

## 2012-11-22 ENCOUNTER — Ambulatory Visit (INDEPENDENT_AMBULATORY_CARE_PROVIDER_SITE_OTHER): Payer: Medicare Other | Admitting: Family Medicine

## 2012-11-22 DIAGNOSIS — M1990 Unspecified osteoarthritis, unspecified site: Secondary | ICD-10-CM

## 2012-11-22 DIAGNOSIS — E785 Hyperlipidemia, unspecified: Secondary | ICD-10-CM

## 2012-11-22 DIAGNOSIS — E119 Type 2 diabetes mellitus without complications: Secondary | ICD-10-CM

## 2012-11-22 DIAGNOSIS — M109 Gout, unspecified: Secondary | ICD-10-CM

## 2012-11-22 DIAGNOSIS — M129 Arthropathy, unspecified: Secondary | ICD-10-CM

## 2012-11-22 DIAGNOSIS — I2510 Atherosclerotic heart disease of native coronary artery without angina pectoris: Secondary | ICD-10-CM

## 2012-11-22 DIAGNOSIS — I10 Essential (primary) hypertension: Secondary | ICD-10-CM

## 2012-11-22 NOTE — Progress Notes (Signed)
Patient ID: Carmen [name], female   DOB: 02/23/1941, 72 y.o.   MRN: [id number] SUBJECTIVE: CC: Chief Complaint  Patient presents with  . Follow-up    reck bp     HPI: Patient is here for follow up of Diabetes Mellitus: Symptoms evaluated: Denies Nocturia ,Denies Urinary Frequency , denies Blurred vision ,deniesDizziness,denies.Dysuria,denies paresthesias, denies extremity pain or ulcers.Marland Kitchendenies chest pain. has had an annual eye exam. do check the feet. Does check CBGs. Average CBG: doing well Denies episodes of hypoglycemia. Does have an emergency hypoglycemic plan. admits toCompliance with medications. Denies Problems with medications.   Has appointment with Dr Myna Hidalgo in regards to follow up of the positive hemochromatosis test.  Came to follow up on the calcium. She has stopped her Vitamin D and her calcium supplements.  No problems.   Past Medical History  Diagnosis Date  . Anemia   . Hypertension   . Arthritis   . Hyperlipidemia   . Diverticulosis   . Gout   . Diabetes mellitus without complication   . CAD (coronary artery disease)    Past Surgical History  Procedure Laterality Date  . Lipoma excision      back  . Breast surgery      left breast lump--benign  . Cardiac catheterization  06/03/2005  . Coronary artery bypass graft  2007    x2   History   Social History  . Marital Status: Married    Spouse Name: N/A    Number of Children: N/A  . Years of Education: N/A   Occupational History  . Not on file.   Social History Main Topics  . Smoking status: Never Smoker   . Smokeless tobacco: Not on file  . Alcohol Use: No  . Drug Use: No  . Sexual Activity: Not on file   Other Topics Concern  . Not on file   Social History Narrative  . No narrative on file   No family history on file. Current Outpatient Prescriptions on File Prior to Visit  Medication Sig Dispense Refill  . allopurinol (ZYLOPRIM) 300 MG tablet Take 0.5 tablets (150 mg total)  by mouth daily.  30 tablet  11  . amLODipine (NORVASC) 2.5 MG tablet Take 1 tablet (2.5 mg total) by mouth daily.  30 tablet  3  . aspirin 81 MG tablet Take 81 mg by mouth daily.      . colchicine 0.6 MG tablet Take 1 tablet (0.6 mg total) by mouth daily.  30 tablet  3  . fluticasone (FLONASE) 50 MCG/ACT nasal spray USE 2 SPRAYS INTO EACH NOSTRIL DAILY  16 g  2  . metoprolol tartrate (LOPRESSOR) 25 MG tablet TAKE 1 TABLET TWICE A DAY  180 tablet  5  . omega-3 acid ethyl esters (LOVAZA) 1 G capsule Take 2 capsules twice a day  120 capsule  2  . ONE TOUCH ULTRA TEST test strip USE FOR TESTING TWICE DAILY  100 each  1  . pregabalin (LYRICA) 50 MG capsule TAKE 1 CAPSULE BY MOUTH TWICE DAILY  60 capsule  3  . rosuvastatin (CRESTOR) 5 MG tablet Take 1 tablet (5 mg total) by mouth daily. Takes 3x week maybe  30 tablet  11  . sitaGLIPtin (JANUVIA) 100 MG tablet Take 1 tablet (100 mg total) by mouth daily.  30 tablet  11  . triamterene-hydrochlorothiazide (DYAZIDE) 37.5-25 MG per capsule TAKE 1 CAPSULE BY MOUTH DAILY  30 capsule  11  . valsartan (DIOVAN) 160 MG tablet TAKE  1 TABLET EVERY DAY  30 tablet  11  . VITAMIN D, CHOLECALCIFEROL, PO Take 5,000 Units by mouth daily.       No current facility-administered medications on file prior to visit.   Allergies  Allergen Reactions  . Crestor [Rosuvastatin]     Muscle aches    . Zetia [Ezetimibe]   . Zocor [Simvastatin]     Muscle aches   . Sulfa Antibiotics Rash    There is no immunization history on file for this patient. Prior to Admission medications   Medication Sig Start Date End Date Taking? Authorizing Provider  allopurinol (ZYLOPRIM) 300 MG tablet Take 0.5 tablets (150 mg total) by mouth daily. 10/31/12   Ileana Ladd, MD  amLODipine (NORVASC) 2.5 MG tablet Take 1 tablet (2.5 mg total) by mouth daily. 10/31/12   Ileana Ladd, MD  aspirin 81 MG tablet Take 81 mg by mouth daily.    Historical Provider, MD  colchicine 0.6 MG tablet Take 1  tablet (0.6 mg total) by mouth daily. 10/20/12   Ileana Ladd, MD  fenofibrate 54 MG tablet  11/17/12   Historical Provider, MD  fluticasone (FLONASE) 50 MCG/ACT nasal spray USE 2 SPRAYS INTO EACH NOSTRIL DAILY 10/28/12   Ernestina Penna, MD  metoprolol tartrate (LOPRESSOR) 25 MG tablet TAKE 1 TABLET TWICE A DAY 10/21/12   Ileana Ladd, MD  omega-3 acid ethyl esters (LOVAZA) 1 G capsule Take 2 capsules twice a day 07/19/12   Ileana Ladd, MD  ONE TOUCH ULTRA TEST test strip USE FOR TESTING TWICE DAILY 10/17/12   Ileana Ladd, MD  pregabalin (LYRICA) 50 MG capsule TAKE 1 CAPSULE BY MOUTH TWICE DAILY 11/04/12   Ileana Ladd, MD  rosuvastatin (CRESTOR) 5 MG tablet Take 1 tablet (5 mg total) by mouth daily. Takes 3x week maybe 10/20/12   Ileana Ladd, MD  sitaGLIPtin (JANUVIA) 100 MG tablet Take 1 tablet (100 mg total) by mouth daily. 10/20/12   Ileana Ladd, MD  triamterene-hydrochlorothiazide (DYAZIDE) 37.5-25 MG per capsule TAKE 1 CAPSULE BY MOUTH DAILY 10/20/12   Ileana Ladd, MD  valsartan (DIOVAN) 160 MG tablet TAKE 1 TABLET EVERY DAY 10/20/12   Ileana Ladd, MD  VITAMIN D, CHOLECALCIFEROL, PO Take 5,000 Units by mouth daily.    Historical Provider, MD     ROS: As above in the HPI. All other systems are stable or negative.  OBJECTIVE: APPEARANCE:  Patient in no acute distress.The patient appeared well nourished and normally developed. Acyanotic. Waist: VITAL SIGNS:BP 149/74  Pulse 56  Temp(Src) 97.8 F (36.6 C) (Oral)  Wt 189 lb 6.4 oz (85.911 kg)  BMI 36.99 kg/m2 WF  SKIN: warm and  Dry without overt rashes, tattoos and scars  HEAD and Neck: without JVD, Head and scalp: normal Eyes:No scleral icterus. Fundi normal, eye movements normal. Ears: Auricle normal, canal normal, Tympanic membranes normal, insufflation normal. Nose: normal Throat: normal Neck & thyroid: normal  CHEST & LUNGS: Chest wall: normal Lungs: Clear  CVS: Reveals the PMI to be normally  located. Regular rhythm, First and Second Heart sounds are normal,  absence of murmurs, rubs or gallops. Peripheral vasculature: Radial pulses: normal Dorsal pedis pulses: normal Posterior pulses: normal  ABDOMEN:  Appearance: overweight Benign, no organomegaly, no masses, no Abdominal Aortic enlargement. No Guarding , no rebound. No Bruits. Bowel sounds: normal  RECTAL: N/A GU: N/A  EXTREMETIES: nonedematous.  MUSCULOSKELETAL:  Spine: normal Joints: intact  NEUROLOGIC:  oriented to time,place and person; nonfocal. Strength is normal Sensory is normal Reflexes are normal Cranial Nerves are normal.  Results for orders placed in visit on 10/31/12  CBC WITH DIFFERENTIAL/PLATELET      Result Value Range   WBC CANCELED     RBC CANCELED     Hemoglobin CANCELED     HCT CANCELED     Platelets CANCELED     Neutrophils Relative % CANCELED     Lymphs CANCELED     Monocytes CANCELED     Eos CANCELED     Lymphocytes Absolute CANCELED     Eosinophils Absolute CANCELED     Basophils Absolute CANCELED    FERRITIN      Result Value Range   Ferritin 347 (*) 15 - 150 ng/mL  THYROID PANEL WITH TSH      Result Value Range   TSH 2.760  0.450 - 4.500 uIU/mL   T4, Total 7.2  4.5 - 12.0 ug/dL   T3 Uptake Ratio 26  24 - 39 %   Free Thyroxine Index 1.9  1.2 - 4.9  CMP14+EGFR      Result Value Range   Glucose 113 (*) 65 - 99 mg/dL   BUN 24  8 - 27 mg/dL   Creatinine, Ser 1.61 (*) 0.57 - 1.00 mg/dL   GFR calc non Af Amer 31 (*) >59 mL/min/1.73   GFR calc Af Amer 36 (*) >59 mL/min/1.73   BUN/Creatinine Ratio 15  11 - 26   Sodium 143  134 - 144 mmol/L   Potassium 4.5  3.5 - 5.2 mmol/L   Chloride 99  97 - 108 mmol/L   CO2 30 (*) 18 - 29 mmol/L   Calcium 11.2 (*) 8.6 - 10.2 mg/dL   Total Protein 7.7  6.0 - 8.5 g/dL   Albumin 4.8  3.5 - 4.8 g/dL   Globulin, Total 2.9  1.5 - 4.5 g/dL   Albumin/Globulin Ratio 1.7  1.1 - 2.5   Total Bilirubin 0.3  0.0 - 1.2 mg/dL   Alkaline  Phosphatase 65  39 - 117 IU/L   AST 63 (*) 0 - 40 IU/L   ALT 79 (*) 0 - 32 IU/L  PTH, INTACT AND CALCIUM      Result Value Range   PTH 21  15 - 65 pg/mL   PTH Comment      ASSESSMENT: Hypercalcemia - Plan: Calcium, ionized  CAD (coronary artery disease)  DM (diabetes mellitus)  HLD (hyperlipidemia)  Arthritis  HTN (hypertension)  Gout  Orders Placed This Encounter  Procedures  . Calcium, ionized    Reviewed labs with patient. Await the ionized calcium.  Continue as planned.  Return in about 3 months (around 02/21/2013) for Recheck medical problems.  Flynn Lininger P. [place] Charon, M.D.  PLAN:

## 2012-11-23 ENCOUNTER — Telehealth: Payer: Self-pay | Admitting: Family Medicine

## 2012-11-23 LAB — CALCIUM, IONIZED: Calcium, Ion: 5.5 mg/dL (ref 4.5–5.6)

## 2012-11-23 NOTE — Progress Notes (Signed)
Quick Note:  Call patient. Labs normal.ionized calcium. No change in plan. ______

## 2012-11-24 ENCOUNTER — Ambulatory Visit (HOSPITAL_BASED_OUTPATIENT_CLINIC_OR_DEPARTMENT_OTHER): Payer: Medicare Other

## 2012-11-24 ENCOUNTER — Other Ambulatory Visit (HOSPITAL_BASED_OUTPATIENT_CLINIC_OR_DEPARTMENT_OTHER): Payer: Medicare Other | Admitting: Lab

## 2012-11-24 ENCOUNTER — Ambulatory Visit: Payer: Medicare Other

## 2012-11-24 ENCOUNTER — Encounter: Payer: Self-pay | Admitting: Hematology & Oncology

## 2012-11-24 ENCOUNTER — Ambulatory Visit (HOSPITAL_BASED_OUTPATIENT_CLINIC_OR_DEPARTMENT_OTHER): Payer: Medicare Other | Admitting: Hematology & Oncology

## 2012-11-24 HISTORY — DX: Hereditary hemochromatosis: E83.110

## 2012-11-24 LAB — CBC WITH DIFFERENTIAL (CANCER CENTER ONLY)
BASO#: 0 10*3/uL (ref 0.0–0.2)
EOS%: 2.2 % (ref 0.0–7.0)
Eosinophils Absolute: 0.2 10*3/uL (ref 0.0–0.5)
HCT: 41.9 % (ref 34.8–46.6)
HGB: 14.3 g/dL (ref 11.6–15.9)
LYMPH#: 3.2 10*3/uL (ref 0.9–3.3)
MCHC: 34.1 g/dL (ref 32.0–36.0)
MONO#: 0.6 10*3/uL (ref 0.1–0.9)
NEUT#: 5.5 10*3/uL (ref 1.5–6.5)
NEUT%: 57.8 % (ref 39.6–80.0)
RBC: 4.55 10*6/uL (ref 3.70–5.32)
WBC: 9.5 10*3/uL (ref 3.9–10.0)

## 2012-11-24 LAB — FERRITIN CHCC: Ferritin: 314 ng/ml — ABNORMAL HIGH (ref 9–269)

## 2012-11-24 LAB — IRON AND TIBC CHCC: Iron: 104 ug/dL (ref 41–142)

## 2012-11-24 NOTE — Patient Instructions (Addendum)

## 2012-11-24 NOTE — Telephone Encounter (Signed)
Patient already notified

## 2012-11-24 NOTE — Progress Notes (Signed)
Carmen [name] presents today for phlebotomy per MD orders. Phlebotomy procedure started at 1335 and ended at 1515 500 grams removed. Patient observed for 30 minutes after procedure without any incident. Patient tolerated procedure well. IV needle removed intact.

## 2012-11-28 LAB — COMPREHENSIVE METABOLIC PANEL
Albumin: 4.7 g/dL (ref 3.5–5.2)
Alkaline Phosphatase: 55 U/L (ref 39–117)
Calcium: 10.9 mg/dL — ABNORMAL HIGH (ref 8.4–10.5)
Chloride: 101 mEq/L (ref 96–112)
Glucose, Bld: 124 mg/dL — ABNORMAL HIGH (ref 70–99)
Potassium: 4.1 mEq/L (ref 3.5–5.3)
Sodium: 139 mEq/L (ref 135–145)
Total Protein: 7.6 g/dL (ref 6.0–8.3)

## 2012-11-30 ENCOUNTER — Ambulatory Visit (HOSPITAL_BASED_OUTPATIENT_CLINIC_OR_DEPARTMENT_OTHER): Payer: Medicare Other

## 2012-11-30 NOTE — Progress Notes (Signed)
Carmen [name] presents today for phlebotomy per MD orders. Phlebotomy procedure started at 1330 and ended at 1345. 500 ml removed. Patient observed for 30 minutes after procedure without any incident. Patient tolerated procedure well. IV needle removed intact.

## 2012-12-01 ENCOUNTER — Telehealth: Payer: Self-pay | Admitting: Family Medicine

## 2012-12-02 ENCOUNTER — Other Ambulatory Visit: Payer: Self-pay | Admitting: Family Medicine

## 2012-12-02 DIAGNOSIS — M25572 Pain in left ankle and joints of left foot: Secondary | ICD-10-CM

## 2012-12-02 NOTE — Telephone Encounter (Signed)
Referral done in [facility]. 

## 2012-12-03 NOTE — Progress Notes (Signed)
This office note has been dictated.

## 2012-12-03 NOTE — Progress Notes (Signed)
CC:   Francis P. Modesto Charon, M.D.  DIAGNOSES:  Double heterozygote (C282Y/S65C) hemochromatosis.  HISTORY OF PRESENT ILLNESS:  Ms. Lashway is a very charming 72 year old Caucasian female.  She is followed by Dr. Modesto Charon.  She has a family history of hemochromatosis.  She actually was checked by Dr. Modesto Charon.  She does carry a double heterozygote gene.  She has a C282Y/S65C mutations.  Because of this, she was referred to the [facility] for an evaluation.  She had lab work done back in late July.  She had minimally elevated LFTs.  She had a calcium of 11.3, with an albumin of 5.  BUN and creatinine were 22 and 1.49.  A CBC was done, which showed a white cell count of 10, hemoglobin 14, hematocrit 42.5, platelet count 159.  MCV was 91.  I do not see any iron studies that were done.  She has had no obvious complaints of abdominal pain.  There have been no problems with headache.  She has had no thyroid issues.  She has had no leg swelling.  She does have longstanding diabetes.  She has had no cough or shortness breath.  There has been no change in bowel or bladder habits.  She has her routine mammograms.  PAST MEDICAL HISTORY:  Remarkable. 1. Hypertension. 2. Hyperlipidemia. 3. Gout. 4. Non-insulin-dependent diabetes. 5. Coronary artery disease.  ALLERGIES: 1. Statin drugs. 2. Sulfa antibiotics. 3. Zetia.  MEDICATIONS:  Allopurinol 100 mg p.o. daily, aspirin 81 mg p.o. daily, Lyrica 50 mg p.o. b.i.d., Lopressor 25 mg p.o. b.i.d., Lovaza 2 p.o. b.i.d.,  Crestor 5 mg p.o. daily, Januvia 100 mg p.o. daily, Dyazide (37.5/25) one p.o. daily, colchicine 0.6 mg p.o. daily, Diovan 160 mg p.o. daily.  SOCIAL HISTORY:  Negative for tobacco use.  There is no alcohol use. There is no occupational exposure.  FAMILY HISTORY:  Remarkable for the hemochromatosis.  There is no history of liver cancer in the family.  There is some heart failure in the family.  REVIEW OF  SYSTEMS:  As stated in history of present illness.  No additional findings noted on a 12-system review.  She has had no weight loss or weight gain.  There are no palpable lymph glands.  There are no mouth sores.  There are no ocular issues.  She has had no hearing problems.  There was no chest wall pain.  She has had no hemoptysis. There has been no diarrhea or constipation.  She has had no leg swelling.  There have been no rashes.  She denies any kind of joint issues outside of some chronic arthritic issues.  PHYSICAL EXAMINATION:  General:  This is a fairly well-developed, well- nourished white female in no obvious distress.  Vital signs: Temperature of 98.1, pulse 57, respiratory rate 16, blood pressure 142/58.  Weight is 189.  Head and neck:  Normocephalic, atraumatic skull.  There are no ocular or oral lesions.  There are no palpable cervical or supraclavicular lymph nodes.  Lungs:  Clear bilaterally. Cardiac:  Regular rate and rhythm with a normal S1, S2.  There are no murmurs, rubs or bruits.  Abdomen:  Soft.  She has good bowel sounds. She has no palpable abdominal mass.  There is no fluid wave.  There is no palpable hepatosplenomegaly. Back:  No tenderness over the spine, ribs, or hips.  Extremities:  Show no clubbing, cyanosis or edema.  She had some osteoarthritic changes in her joints.  She has decent strength in  her legs.  Skin:  No rashes, ecchymoses or petechia.  LABORATORY STUDIES:  White cell count 9.5, hemoglobin 14.3, hematocrit 41.9, platelet count 172.  MCV is 92.  Ferritin is 314.  Iron saturation was 35%.  Her peripheral smear shows normochromic, normocytic population of red blood cells.  I do not see any nucleated red blood cells.  She has no teardrop cells.  There are no target cells.  White cells appear normal in morphology and maturation.  She has no immature myeloid or lymphoid forms.  Platelets are adequate in number and size.  IMPRESSION:  Ms. Shelton  is a very charming 72 year old white female with double heterozygote hemochromatosis.  I would not think that she would be too "prolific" in retaining iron.  Typically, the double heterozygotes are not all that bad.  Her ferritin is 314. Iron saturation is 35%.  We certainly can improve upon this.  Some of her ferritin elevation could be from an acute phase reactant.  However, we will get her on a phlebotomy program.  We will go ahead and see about getting her phlebotomized like every other week.  Given that she is 72 years old, I do not think we can be too aggressive with her phlebotomy program.  I think if we can do a phlebotomy every other week, we can still get her iron down nicely.  I will plan for 3 phlebotomies.  I will then plan to see her back afterwards.  We will check her iron studies.  Again, being a double heterozygote, I really do not think that she will be too avid in accumulating iron.  I spent a good hour or so with Ms. Olshefski.  She is very, very charming.  I will come again, plan to see her back in another 6 weeks.    ______________________________ Josph Macho, M.D. PRE/MEDQ  D:  12/03/2012  T:  12/03/2012  Job:  [id number]

## 2012-12-05 ENCOUNTER — Encounter: Payer: Self-pay | Admitting: *Deleted

## 2012-12-05 ENCOUNTER — Telehealth: Payer: Self-pay | Admitting: Hematology & Oncology

## 2012-12-05 NOTE — Telephone Encounter (Signed)
Received new in basket per MD keep appointments from week before

## 2012-12-07 ENCOUNTER — Ambulatory Visit: Payer: Medicare Other

## 2012-12-07 NOTE — Progress Notes (Signed)
Phebotomy attempted x4 in pt's right and left anticubical without success.  Pt will return tomorrow.  Encouraged pt to drink a lot of fluids for the next 24 hours.

## 2012-12-08 ENCOUNTER — Ambulatory Visit (HOSPITAL_BASED_OUTPATIENT_CLINIC_OR_DEPARTMENT_OTHER): Payer: Medicare Other

## 2012-12-08 NOTE — Progress Notes (Signed)
Carmen [name] presents today for phlebotomy per MD orders. Phlebotomy procedure started at 1100 and ended at 1140. 500 grams removed.per Amy bickling RN   Patient observed for 30 minutes after procedure without any incident. Patient tolerated procedure well. IV needle removed intact.

## 2012-12-08 NOTE — Patient Instructions (Addendum)

## 2012-12-15 ENCOUNTER — Other Ambulatory Visit (HOSPITAL_BASED_OUTPATIENT_CLINIC_OR_DEPARTMENT_OTHER): Payer: Medicare Other | Admitting: Lab

## 2012-12-15 ENCOUNTER — Ambulatory Visit (HOSPITAL_BASED_OUTPATIENT_CLINIC_OR_DEPARTMENT_OTHER): Payer: Medicare Other | Admitting: Hematology & Oncology

## 2012-12-15 DIAGNOSIS — E119 Type 2 diabetes mellitus without complications: Secondary | ICD-10-CM

## 2012-12-15 DIAGNOSIS — D649 Anemia, unspecified: Secondary | ICD-10-CM

## 2012-12-15 LAB — CBC WITH DIFFERENTIAL (CANCER CENTER ONLY)
BASO#: 0 10*3/uL (ref 0.0–0.2)
Eosinophils Absolute: 0.3 10*3/uL (ref 0.0–0.5)
HCT: 34 % — ABNORMAL LOW (ref 34.8–46.6)
HGB: 11.3 g/dL — ABNORMAL LOW (ref 11.6–15.9)
MCH: 31.8 pg (ref 26.0–34.0)
MCV: 96 fL (ref 81–101)
MONO%: 7.9 % (ref 0.0–13.0)
NEUT#: 4.7 10*3/uL (ref 1.5–6.5)
NEUT%: 51.1 % (ref 39.6–80.0)
RBC: 3.55 10*6/uL — ABNORMAL LOW (ref 3.70–5.32)

## 2012-12-15 LAB — IRON AND TIBC CHCC
%SAT: 20 % — ABNORMAL LOW (ref 21–57)
Iron: 61 ug/dL (ref 41–142)

## 2012-12-15 NOTE — Progress Notes (Signed)
This office note has been dictated.

## 2012-12-16 ENCOUNTER — Encounter: Payer: Self-pay | Admitting: *Deleted

## 2012-12-20 NOTE — Progress Notes (Signed)
CC:   Carmen [name], M.D.  DIAGNOSIS:  Hemochromatosis (double heterozygote C282Y and S65C mutation).  CURRENT THERAPY:  Phlebotomy to try to maintain a ferritin about 100.  INTERIM HISTORY:  Carmen [name] comes in for followup.  She is doing okay.  Since we last saw her back in early September, she has been phlebotomized.  When we first saw her, her ferritin was 314.  Her iron saturation was 35%.  She has done well with phlebotomies.  She does feel a little tired now.  She and her husband are getting ready to go to [place] in a couple of weeks.  They will be there for 10 days.  She has had no problems with bleeding.  She has a little bit of arthritis.  She has some gout.  She also has diabetes.  PHYSICAL EXAMINATION:  General:  This is a fairly well-developed, well- nourished white female in no obvious distress.  Vital Signs: Temperature 98.2, pulse 61, respiratory rate 16, blood pressure 144/58. Weight is 191 pounds.  Head and Neck:  Normocephalic, atraumatic skull. There are no ocular or oral lesions.  There are no palpable cervical or supraclavicular lymph nodes.  Lungs:  Clear bilaterally.  Cardiac: Regular rate and rhythm with a normal S1, S2.  There are no murmurs, rubs or bruits.  Abdomen:  Soft.  She has good bowel sounds.  There is no fluid wave.  There is no palpable abdominal mass.  There is no palpable hepatosplenomegaly.  Extremities:  Show no clubbing, cyanosis or edema.  Neurological:  Shows no focal neurological deficits.  LABORATORY STUDIES:  White cell count is 9.2, hemoglobin 11.3, hematocrit 34, platelet count is 167.  Ferritin is 125, with an iron saturation of 20%.  IMPRESSION:  Carmen [name] is a very nice 72 year old white female with hemochromatosis.  Again, she is double heterozygote.  PLAN:  For now, we will hold off on phlebotomizing her.  I think that she should do okay.  She certainly is anemic now.  Her iron saturation is coming down quite  nicely.  I want to see her back after she gets back from her trip.    ______________________________ Josph Macho, M.D. PRE/MEDQ  D:  12/15/2012  T:  12/20/2012  Job:  [id number]

## 2012-12-26 ENCOUNTER — Other Ambulatory Visit: Payer: Self-pay | Admitting: Family Medicine

## 2013-01-09 LAB — HM DIABETES EYE EXAM

## 2013-01-12 ENCOUNTER — Other Ambulatory Visit: Payer: Self-pay | Admitting: Family Medicine

## 2013-01-16 ENCOUNTER — Ambulatory Visit (HOSPITAL_BASED_OUTPATIENT_CLINIC_OR_DEPARTMENT_OTHER): Payer: Medicare Other | Admitting: Hematology & Oncology

## 2013-01-16 ENCOUNTER — Other Ambulatory Visit (HOSPITAL_BASED_OUTPATIENT_CLINIC_OR_DEPARTMENT_OTHER): Payer: Medicare Other | Admitting: Lab

## 2013-01-16 LAB — CBC WITH DIFFERENTIAL (CANCER CENTER ONLY)
BASO#: 0 10*3/uL (ref 0.0–0.2)
BASO%: 0.3 % (ref 0.0–2.0)
HCT: 38.4 % (ref 34.8–46.6)
HGB: 12.6 g/dL (ref 11.6–15.9)
LYMPH%: 35.9 % (ref 14.0–48.0)
MCHC: 32.8 g/dL (ref 32.0–36.0)
MCV: 95 fL (ref 81–101)
MONO#: 0.7 10*3/uL (ref 0.1–0.9)
MONO%: 6.6 % (ref 0.0–13.0)
NEUT%: 53.8 % (ref 39.6–80.0)
Platelets: 170 10*3/uL (ref 145–400)
RDW: 13.4 % (ref 11.1–15.7)
WBC: 9.8 10*3/uL (ref 3.9–10.0)

## 2013-01-16 LAB — IRON AND TIBC CHCC
%SAT: 23 % (ref 21–57)
UIBC: 232 ug/dL (ref 120–384)

## 2013-01-16 NOTE — Progress Notes (Signed)
This office note has been dictated.

## 2013-01-17 ENCOUNTER — Telehealth: Payer: Self-pay | Admitting: *Deleted

## 2013-01-17 NOTE — Telephone Encounter (Signed)
Called patient to let her know that her iron levels are now 75 and no need for phlebotomy

## 2013-01-17 NOTE — Progress Notes (Signed)
CC:   Francis P. Modesto Charon, M.D.  DIAGNOSIS:  Hemochromatosis (double heterozygote C282Y and S65C mutation).  CURRENT THERAPY:  Phlebotomy to maintain ferritin less than 100.  INTERIM HISTORY:  Mrs. Edell comes in for followup.  She had a good time in [place].  She and her husband went after they were here back in September.  When we last saw her, her ferritin was down to 125.  Iron saturation was only 20%, so I thought that was okay to hold off on phlebotomizing her. Today, she is having problems with her lower back.  She has sciatica. She has pain going down her right thigh.  Otherwise, she seems to be doing okay.  She says she has problems with gout.  She takes allopurinol twice a day.  She is also on colchicine. She does take quite a few medications.  She is a diabetic.  PHYSICAL EXAMINATION:  General:  This is a mildly obese white female in no obvious distress.  Vital Signs:  Temperature of 98.8, pulse 58, respiratory rate 14, blood pressure 148/54.  Weight is 191 pounds.  Head and Neck:  Exam shows a normocephalic, atraumatic skull.  There are no ocular or oral lesions.  There are no palpable cervical or supraclavicular lymph nodes.  Lungs:  Clear bilaterally.  Cardiac: Regular rate and rhythm with a normal S1 and S2.  There are no murmurs, rubs, or bruits.  Abdomen:  Soft.  She has good bowel sounds.  There is no fluid wave.  There is no palpable hepatosplenomegaly.  Extremities: Show no clubbing, cyanosis, or edema.  Skin:  No rashes, ecchymoses, or petechia.  Neurological:  No focal neurological deficits.  LABORATORY STUDIES:  White cell count is 9.8, hemoglobin 12.6, hematocrit 38.4, and platelet count is 170.  MCV is 95.  IMPRESSION:  Mrs. Ferdinand is a very nice 72 year old white female with hemochromatosis.  She is a double heterozygote.  I feel that we probably will need to phlebotomize her.  Her hemoglobin is on the way back up.  I will plan to get her back in  about 6 weeks' time.  I will let her know about the iron levels.  She has quite a few medications.  She does have other health issues that we have to be aware of.    ______________________________ Josph Macho, M.D. PRE/MEDQ  D:  01/16/2013  T:  01/17/2013  Job:  [id number]

## 2013-01-17 NOTE — Telephone Encounter (Signed)
Message copied by Anselm Jungling on Tue Jan 17, 2013  2:21 PM ------      Message from: Josph Macho      Created: Mon Jan 16, 2013  6:43 PM       Call - tell her that her iron is now 47!!  This is great!!  Going to [place] must have helped!!!  No need to phlebotomy her!!  Cindee Lame ------

## 2013-01-18 ENCOUNTER — Other Ambulatory Visit: Payer: Self-pay | Admitting: Family Medicine

## 2013-02-01 ENCOUNTER — Other Ambulatory Visit: Payer: Self-pay | Admitting: Family Medicine

## 2013-02-02 NOTE — Telephone Encounter (Signed)
LAST RF 12/31/12. LAST OV 11/22/12. CALL IN [facility].

## 2013-02-06 ENCOUNTER — Other Ambulatory Visit: Payer: Self-pay | Admitting: Family Medicine

## 2013-02-07 NOTE — Telephone Encounter (Signed)
rx ready for pickup 

## 2013-02-07 NOTE — Telephone Encounter (Signed)
Up front 

## 2013-02-12 ENCOUNTER — Other Ambulatory Visit: Payer: Self-pay | Admitting: Family Medicine

## 2013-02-19 ENCOUNTER — Other Ambulatory Visit: Payer: Self-pay | Admitting: Family Medicine

## 2013-02-27 ENCOUNTER — Other Ambulatory Visit (HOSPITAL_BASED_OUTPATIENT_CLINIC_OR_DEPARTMENT_OTHER): Payer: Medicare Other | Admitting: Lab

## 2013-02-27 ENCOUNTER — Ambulatory Visit (HOSPITAL_BASED_OUTPATIENT_CLINIC_OR_DEPARTMENT_OTHER): Payer: Medicare Other | Admitting: Hematology & Oncology

## 2013-02-27 LAB — CBC WITH DIFFERENTIAL (CANCER CENTER ONLY)
BASO#: 0 10*3/uL (ref 0.0–0.2)
Eosinophils Absolute: 0.3 10*3/uL (ref 0.0–0.5)
HCT: 41 % (ref 34.8–46.6)
HGB: 13.5 g/dL (ref 11.6–15.9)
LYMPH#: 3.9 10*3/uL — ABNORMAL HIGH (ref 0.9–3.3)
LYMPH%: 39.1 % (ref 14.0–48.0)
MCH: 30.1 pg (ref 26.0–34.0)
MCV: 92 fL (ref 81–101)
MONO%: 6.6 % (ref 0.0–13.0)
NEUT%: 50.6 % (ref 39.6–80.0)
RBC: 4.48 10*6/uL (ref 3.70–5.32)
WBC: 10 10*3/uL (ref 3.9–10.0)

## 2013-02-27 LAB — FERRITIN CHCC: Ferritin: 93 ng/ml (ref 9–269)

## 2013-02-27 NOTE — Progress Notes (Signed)
This office note has been dictated.

## 2013-02-27 NOTE — Progress Notes (Signed)
CC:   Carmen [name], M.D.  DIAGNOSIS:  Hemochromatosis (double heterozygote for C282Y and S65C mutation).  CURRENT THERAPY:  Phlebotomy to try to maintain a ferritin about 100.  INTERIM HISTORY:  Carmen [name] comes in for followup.  She is doing okay.  Since we last saw her back in early September, she has been phlebotomized.  When we first saw her, her ferritin was 314.  Her iron saturation is 35%.  She has done well with the phlebotomies.  She does feel little tired now.  She and her husband are getting ready to go to [place] in a couple of weeks.  They will be there for 10 days.  She has had no problems with bleeding.  She has little bit of arthritis. She has some gout.  She also has diabetes.  PHYSICAL EXAMINATION:  General:  This is a fairly well-developed, well- nourished, white female, in no obvious distress.  Vital Signs: Temperature of 98.2, pulse 61, respiratory rate 16, blood pressure of 144/58, weight is 191 pounds.  Head and Neck:  Normocephalic, atraumatic skull.  There are no ocular or oral lesions.  There are no palpable cervical or supraclavicular lymph nodes.  Lungs:  Clear bilaterally. Cardiac:  Regular rate and rhythm with normal S1, S2.  There are no murmurs, rubs or bruits.  Abdomen:  Soft.  She has good bowel sounds. There is no fluid wave.  There is no palpable abdominal mass.  No palpable hepatosplenomegaly.  Extremities:  Show no clubbing, cyanosis, or edema.  Neurologic:  Shows no focal neurological deficits.  LABORATORY STUDIES:  White cell count is 9.2, hemoglobin 11.3, hematocrit 34, platelet count is 167.  Ferritin is 125 with an iron saturation of 20%.  IMPRESSION:  Carmen [name] is a very nice 72 year old white female with hemochromatosis.  Again, she is a double heterozygote.  For now, we will hold off on phlebotomizing her.  I think that she should do okay.  She certainly is anemic now.  Her iron saturation has come down  quite nicely.  I want to see her back after she gets back from her trip.   ______________________________ Josph Macho, M.D. PRE/MEDQ  D:  12/15/2012  T:  02/26/2013  Job:  [id number]

## 2013-02-28 ENCOUNTER — Telehealth: Payer: Self-pay | Admitting: Nurse Practitioner

## 2013-02-28 ENCOUNTER — Ambulatory Visit (INDEPENDENT_AMBULATORY_CARE_PROVIDER_SITE_OTHER): Payer: Medicare Other | Admitting: Family Medicine

## 2013-02-28 ENCOUNTER — Encounter: Payer: Self-pay | Admitting: Family Medicine

## 2013-02-28 VITALS — BP 139/66 | HR 68 | Temp 98.3°F | Ht 61.0 in | Wt 189.8 lb

## 2013-02-28 DIAGNOSIS — I10 Essential (primary) hypertension: Secondary | ICD-10-CM

## 2013-02-28 DIAGNOSIS — E785 Hyperlipidemia, unspecified: Secondary | ICD-10-CM

## 2013-02-28 DIAGNOSIS — G709 Myoneural disorder, unspecified: Secondary | ICD-10-CM

## 2013-02-28 DIAGNOSIS — M129 Arthropathy, unspecified: Secondary | ICD-10-CM

## 2013-02-28 DIAGNOSIS — I493 Ventricular premature depolarization: Secondary | ICD-10-CM

## 2013-02-28 DIAGNOSIS — I2510 Atherosclerotic heart disease of native coronary artery without angina pectoris: Secondary | ICD-10-CM

## 2013-02-28 DIAGNOSIS — Z951 Presence of aortocoronary bypass graft: Secondary | ICD-10-CM

## 2013-02-28 DIAGNOSIS — M109 Gout, unspecified: Secondary | ICD-10-CM

## 2013-02-28 DIAGNOSIS — I4949 Other premature depolarization: Secondary | ICD-10-CM

## 2013-02-28 DIAGNOSIS — M1990 Unspecified osteoarthritis, unspecified site: Secondary | ICD-10-CM

## 2013-02-28 DIAGNOSIS — E119 Type 2 diabetes mellitus without complications: Secondary | ICD-10-CM

## 2013-02-28 LAB — POCT UA - MICROALBUMIN: Microalbumin Ur, POC: NEGATIVE mg/L

## 2013-02-28 LAB — POCT GLYCOSYLATED HEMOGLOBIN (HGB A1C): Hemoglobin A1C: 6

## 2013-02-28 MED ORDER — PREGABALIN 50 MG PO CAPS: ORAL_CAPSULE | ORAL | Status: DC

## 2013-02-28 MED ORDER — METOPROLOL TARTRATE 25 MG PO TABS
ORAL_TABLET | ORAL | Status: DC
Start: 2013-02-28 — End: 2014-04-10

## 2013-02-28 NOTE — Telephone Encounter (Addendum)
Message copied by Glee Arvin on Tue Feb 28, 2013  3:54 PM ------      Message from: Arlan Organ R      Created: Mon Feb 27, 2013  9:45 PM       Call - iron is going up.  Need to phlebotomize x 1.  Please set up!!  Pete ------Pt verbalized understanding and stated she had to get home and check calendar and would give Raiford Noble a call back to schedule.

## 2013-02-28 NOTE — Patient Instructions (Signed)
    Dr Marguetta Windish's Recommendations  For nutrition information, I recommend books:  1).Eat to Live by Dr Joel Fuhrman. 2).Prevent and Reverse Heart Disease by Dr Caldwell Esselstyn. 3) Dr Neal Barnard's Book:  Program to Reverse Diabetes  Exercise recommendations are:  If unable to walk, then the patient can exercise in a chair 3 times a day. By flapping arms like a bird gently and raising legs outwards to the front.  If ambulatory, the patient can go for walks for 30 minutes 3 times a week. Then increase the intensity and duration as tolerated.  Goal is to try to attain exercise frequency to 5 times a week.  If applicable: Best to perform resistance exercises (machines or weights) 2 days a week and cardio type exercises 3 days per week.   Diabetes and Foot Care Diabetes may cause you to have problems because of poor blood supply (circulation) to your feet and legs. This may cause the skin on your feet to become thinner, break easier, and heal more slowly. Your skin may become dry, and the skin may peel and crack. You may also have nerve damage in your legs and feet causing decreased feeling in them. You may not notice minor injuries to your feet that could lead to infections or more serious problems. Taking care of your feet is one of the most important things you can do for yourself.  HOME CARE INSTRUCTIONS  Wear shoes at all times, even in the house. Do not go barefoot. Bare feet are easily injured.  Check your feet daily for blisters, cuts, and redness. If you cannot see the bottom of your feet, use a mirror or ask someone for help.  Wash your feet with warm water (do not use hot water) and mild soap. Then pat your feet and the areas between your toes until they are completely dry. Do not soak your feet as this can dry your skin.  Apply a moisturizing lotion or petroleum jelly (that does not contain alcohol and is unscented) to the skin on your feet and to dry, brittle toenails.  Do not apply lotion between your toes.  Trim your toenails straight across. Do not dig under them or around the cuticle. File the edges of your nails with an emery board or nail file.  Do not cut corns or calluses or try to remove them with medicine.  Wear clean socks or stockings every day. Make sure they are not too tight. Do not wear knee-high stockings since they may decrease blood flow to your legs.  Wear shoes that fit properly and have enough cushioning. To break in new shoes, wear them for just a few hours a day. This prevents you from injuring your feet. Always look in your shoes before you put them on to be sure there are no objects inside.  Do not cross your legs. This may decrease the blood flow to your feet.  If you find a minor scrape, cut, or break in the skin on your feet, keep it and the skin around it clean and dry. These areas may be cleansed with mild soap and water. Do not cleanse the area with peroxide, alcohol, or iodine.  When you remove an adhesive bandage, be sure not to damage the skin around it.  If you have a wound, look at it several times a day to make sure it is healing.  Do not use heating pads or hot water bottles. They may burn your skin. If   you have lost feeling in your feet or legs, you may not know it is happening until it is too late.  Make sure your health care provider performs a complete foot exam at least annually or more often if you have foot problems. Report any cuts, sores, or bruises to your health care provider immediately. SEEK MEDICAL CARE IF:   You have an injury that is not healing.  You have cuts or breaks in the skin.  You have an ingrown nail.  You notice redness on your legs or feet.  You feel burning or tingling in your legs or feet.  You have pain or cramps in your legs and feet.  Your legs or feet are numb.  Your feet always feel cold. SEEK IMMEDIATE MEDICAL CARE IF:   There is increasing redness, swelling, or pain in  or around a wound.  There is a red line that goes up your leg.  Pus is coming from a wound.  You develop a fever or as directed by your health care provider.  You notice a bad smell coming from an ulcer or wound. Document Released: 03/06/2000 Document Revised: 11/09/2012 Document Reviewed: 08/16/2012 [employer] Patient Information 2014 [employer].  

## 2013-02-28 NOTE — Progress Notes (Signed)
Patient ID: Carmen [name], female   DOB: 05-May-1940, 72 y.o.   MRN: [id number] SUBJECTIVE: CC: Chief Complaint  Patient presents with  . Follow-up    3 month follow up  sees labs per Dr Myna Hidalgo    HPI: Hemachromatosis: follow ed up and treated by DR Ennever.  Patient is here for follow up of Diabetes Mellitus/HLD/GOUT/HTN: Symptoms evaluated: Denies Nocturia ,Denies Urinary Frequency , denies Blurred vision ,deniesDizziness,denies.Dysuria, The paresthesias/burning of the feet improved with the lyrica., denies extremity pain or ulcers.Marland Kitchendenies chest pain. has had an annual eye exam. do check the feet. Does check CBGs. Average FAO:ZHYQMVHQIO Denies episodes of hypoglycemia. Does have an emergency hypoglycemic plan. admits toCompliance with medications. Denies Problems with medications.   Past Medical History  Diagnosis Date  . Anemia   . Hypertension   . Arthritis   . Hyperlipidemia   . Diverticulosis   . Gout   . Diabetes mellitus without complication   . CAD (coronary artery disease)   . Hemochromatosis, hereditary 11/24/2012  . Neuromuscular disorder     peripheral neuropathy   Past Surgical History  Procedure Laterality Date  . Lipoma excision      back  . Breast surgery      left breast lump--benign  . Cardiac catheterization  06/03/2005  . Coronary artery bypass graft  2007    x2   History   Social History  . Marital Status: Married    Spouse Name: N/A    Number of Children: N/A  . Years of Education: N/A   Occupational History  . Not on file.   Social History Main Topics  . Smoking status: Never Smoker   . Smokeless tobacco: Not on file  . Alcohol Use: No  . Drug Use: No  . Sexual Activity: Not on file   Other Topics Concern  . Not on file   Social History Narrative  . No narrative on file   No family history on file. Current Outpatient Prescriptions on File Prior to Visit  Medication Sig Dispense Refill  . allopurinol (ZYLOPRIM) 100 MG  tablet Take 100 mg by mouth 2 (two) times daily.      Marland Kitchen amLODipine (NORVASC) 2.5 MG tablet TAKE 1 TABLET (2.5 MG TOTAL) BY MOUTH DAILY.  30 tablet  2  . aspirin 81 MG tablet Take 81 mg by mouth daily.      . colchicine 0.6 MG tablet Take 0.6 mg by mouth as needed.      . fluticasone (FLONASE) 50 MCG/ACT nasal spray Place 1 spray into the nose as needed.       Marland Kitchen omega-3 acid ethyl esters (LOVAZA) 1 G capsule TAKE 2 CAPSULES BY MOUTH TWICE A DAY  120 capsule  4  . ONE TOUCH ULTRA TEST test strip USE FOR TESTING TWICE DAILY  100 each  1  . rosuvastatin (CRESTOR) 5 MG tablet Take 5 mg by mouth 3 (three) times a week.      . sitaGLIPtin (JANUVIA) 100 MG tablet Take 1 tablet (100 mg total) by mouth daily.  30 tablet  11  . triamterene-hydrochlorothiazide (DYAZIDE) 37.5-25 MG per capsule TAKE 1 CAPSULE BY MOUTH DAILY  30 capsule  11  . valsartan (DIOVAN) 160 MG tablet TAKE 1 TABLET EVERY DAY  30 tablet  11  . VOLTAREN 1 % GEL Apply 2 g topically as needed.        No current facility-administered medications on file prior to visit.   Allergies  Allergen Reactions  .  Crestor [Rosuvastatin]     Muscle aches  On high doses  . Zetia [Ezetimibe]   . Zocor [Simvastatin]     Muscle aches   . Sulfa Antibiotics Rash   Immunization History  Administered Date(s) Administered  . Influenza, High Dose Seasonal PF 12/09/2012   Prior to Admission medications   Medication Sig Start Date End Date Taking? Authorizing Provider  allopurinol (ZYLOPRIM) 100 MG tablet Take 100 mg by mouth 2 (two) times daily.   Yes Historical Provider, MD  amLODipine (NORVASC) 2.5 MG tablet TAKE 1 TABLET (2.5 MG TOTAL) BY MOUTH DAILY. 02/19/13  Yes Ileana Ladd, MD  aspirin 81 MG tablet Take 81 mg by mouth daily.   Yes Historical Provider, MD  colchicine 0.6 MG tablet Take 0.6 mg by mouth as needed. 10/20/12  Yes Ileana Ladd, MD  fluticasone (FLONASE) 50 MCG/ACT nasal spray Place 1 spray into the nose as needed.  10/28/12  Yes  Ernestina Penna, MD  LYRICA 50 MG capsule TAKE 1 CAPSULE 2 TIMES A DAY 02/06/13  Yes Mary-Margaret Daphine Deutscher, FNP  metoprolol tartrate (LOPRESSOR) 25 MG tablet TAKE 1 TABLET TWICE A DAY 10/21/12  Yes Ileana Ladd, MD  omega-3 acid ethyl esters (LOVAZA) 1 G capsule TAKE 2 CAPSULES BY MOUTH TWICE A DAY 12/26/12  Yes Ileana Ladd, MD  ONE TOUCH ULTRA TEST test strip USE FOR TESTING TWICE DAILY 01/18/13  Yes Ileana Ladd, MD  rosuvastatin (CRESTOR) 5 MG tablet Take 5 mg by mouth 3 (three) times a week.   Yes Historical Provider, MD  sitaGLIPtin (JANUVIA) 100 MG tablet Take 1 tablet (100 mg total) by mouth daily. 10/20/12  Yes Ileana Ladd, MD  triamterene-hydrochlorothiazide (DYAZIDE) 37.5-25 MG per capsule TAKE 1 CAPSULE BY MOUTH DAILY 10/20/12  Yes Ileana Ladd, MD  valsartan (DIOVAN) 160 MG tablet TAKE 1 TABLET EVERY DAY 10/20/12  Yes Ileana Ladd, MD  VOLTAREN 1 % GEL Apply 2 g topically as needed.  12/22/12  Yes Historical Provider, MD     ROS: As above in the HPI. All other systems are stable or negative.  OBJECTIVE: APPEARANCE:  Patient in no acute distress.The patient appeared well nourished and normally developed. Acyanotic. Waist: VITAL SIGNS:BP 139/66  Pulse 68  Temp(Src) 98.3 F (36.8 C) (Oral)  Ht 5\' 1"  (1.549 m)  Wt 189 lb 12.8 oz (86.093 kg)  BMI 35.88 kg/m2 Obese WF  SKIN: warm and  Dry without overt rashes, tattoos and scars  HEAD and Neck: without JVD, Head and scalp: normal Eyes:No scleral icterus. Fundi normal, eye movements normal. Ears: Auricle normal, canal normal, Tympanic membranes normal, insufflation normal. Nose: normal Throat: normal Neck & thyroid: normal  CHEST & LUNGS: Chest wall: normal Lungs: Clear  CVS: Reveals the PMI to be normally located. Regular rhythm, First and Second Heart sounds are normal,  absence of murmurs, rubs or gallops. Peripheral vasculature: Radial pulses: normal Dorsal pedis pulses: normal Posterior pulses:  normal  ABDOMEN:  Appearance: obese Benign, no organomegaly, no masses, no Abdominal Aortic enlargement. No Guarding , no rebound. No Bruits. Bowel sounds: normal  RECTAL: N/A GU: N/A  EXTREMETIES: nonedematous.  MUSCULOSKELETAL:  Spine: normal Joints: intact  NEUROLOGIC: oriented to time,place and person; nonfocal. Strength is normal Sensory is abnormal: see the DM foot exam module Reflexes are normal Cranial Nerves are normal.  ASSESSMENT:  CAD (coronary artery disease) - Plan: metoprolol tartrate (LOPRESSOR) 25 MG tablet  DM (diabetes mellitus) - Plan:  CMP14+EGFR, POCT glycosylated hemoglobin (Hb A1C), POCT UA - Microalbumin  HLD (hyperlipidemia) - Plan: NMR, lipoprofile, CMP14+EGFR  Arthritis  Gout - Plan: Uric acid  HTN (hypertension) - Plan: metoprolol tartrate (LOPRESSOR) 25 MG tablet, CMP14+EGFR  Hemochromatosis, hereditary  S/P CABG x 2  PVC's (premature ventricular contractions)  Neuromuscular disorder - Plan: pregabalin (LYRICA) 50 MG capsule  PLAN:      Dr Woodroe Mode Recommendations  For nutrition information, I recommend books:  1).Eat to Live by Dr Monico Hoar. 2).Prevent and Reverse Heart Disease by Dr Suzzette Righter. 3) Dr Katherina Right Book:  Program to Reverse Diabetes  Exercise recommendations are:  If unable to walk, then the patient can exercise in a chair 3 times a day. By flapping arms like a bird gently and raising legs outwards to the front.  If ambulatory, the patient can go for walks for 30 minutes 3 times a week. Then increase the intensity and duration as tolerated.  Goal is to try to attain exercise frequency to 5 times a week.  If applicable: Best to perform resistance exercises (machines or weights) 2 days a week and cardio type exercises 3 days per week.  DM foot care discussed and handout in the AVS.  Orders Placed This Encounter  Procedures  . Uric acid  . NMR, lipoprofile  . CMP14+EGFR  . POCT  glycosylated hemoglobin (Hb A1C)  . POCT UA - Microalbumin   Meds ordered this encounter  Medications  . metoprolol tartrate (LOPRESSOR) 25 MG tablet    Sig: TAKE 1 TABLET TWICE A DAY    Dispense:  180 tablet    Refill:  5  . pregabalin (LYRICA) 50 MG capsule    Sig: TAKE 1 CAPSULE 2 TIMES A DAY    Dispense:  60 capsule    Refill:  11   Medications Discontinued During This Encounter  Medication Reason  . metoprolol tartrate (LOPRESSOR) 25 MG tablet Reorder  . LYRICA 50 MG capsule Reorder   Return in about 3 months (around 05/29/2013) for Recheck medical problems.  Aylla Huffine P. [place]esto Charon, M.D.

## 2013-03-01 ENCOUNTER — Ambulatory Visit: Payer: Medicare Other | Admitting: Family Medicine

## 2013-03-01 LAB — NMR, LIPOPROFILE
Cholesterol: 318 mg/dL — ABNORMAL HIGH (ref <200)
HDL Cholesterol by NMR: 44 mg/dL (ref >=40)
HDL Particle Number: 31.8 umol/L (ref >=30.5)
LDL Particle Number: 3490 nmol/L — ABNORMAL HIGH (ref <1000)
LDL Size: 20.1 nm — ABNORMAL LOW (ref >20.5)
LDLC SERPL CALC-MCNC: 209 mg/dL — ABNORMAL HIGH (ref <100)
LP-IR Score: 74 — ABNORMAL HIGH (ref <=45)
Small LDL Particle Number: 2230 nmol/L — ABNORMAL HIGH (ref <=527)
Triglycerides by NMR: 323 mg/dL — ABNORMAL HIGH (ref <150)

## 2013-03-01 LAB — CMP14+EGFR
ALT: 38 IU/L — ABNORMAL HIGH (ref 0–32)
AST: 36 IU/L (ref 0–40)
Albumin/Globulin Ratio: 1.7 (ref 1.1–2.5)
Albumin: 4.8 g/dL (ref 3.5–4.8)
Alkaline Phosphatase: 79 IU/L (ref 39–117)
BUN/Creatinine Ratio: 19 (ref 11–26)
BUN: 22 mg/dL (ref 8–27)
CO2: 22 mmol/L (ref 18–29)
Calcium: 11.2 mg/dL — ABNORMAL HIGH (ref 8.6–10.2)
Chloride: 98 mmol/L (ref 97–108)
Creatinine, Ser: 1.18 mg/dL — ABNORMAL HIGH (ref 0.57–1.00)
GFR calc Af Amer: 53 mL/min/{1.73_m2} — ABNORMAL LOW (ref >59)
GFR calc non Af Amer: 46 mL/min/{1.73_m2} — ABNORMAL LOW (ref >59)
Globulin, Total: 2.8 g/dL (ref 1.5–4.5)
Glucose: 87 mg/dL (ref 65–99)
Potassium: 4.4 mmol/L (ref 3.5–5.2)
Sodium: 142 mmol/L (ref 134–144)
Total Bilirubin: 0.2 mg/dL (ref 0.0–1.2)
Total Protein: 7.6 g/dL (ref 6.0–8.5)

## 2013-03-01 LAB — URIC ACID: Uric Acid: 5.8 mg/dL (ref 2.5–7.1)

## 2013-03-01 NOTE — Progress Notes (Signed)
DIAGNOSIS:  Hemochromatosis (double heterozygote for C282Y and S65C mutations).  CURRENT THERAPY:  Phlebotomy to maintain ferritin less than 100.  INTERIM HISTORY:  Carmen [name] comes in for a followup.  She always comes in with her husband.  They went to [place].  They had a good time in [place].  They were there in September.  She has responded very well to phlebotomy.  Her last ferritin was down to 75.  She feels okay.  She has some back issues.  She does have lower lumbar spine problems.  She now has some pain radiating down to her groin area. This sounds like an L2 or L3 lesion.  She does see an orthopedic surgeon.  I would recommend that she go back to see him or her and probably have an MRI done.  She has had no leg pain or swelling.  She has had no rashes.  She has had no cough or shortness of breath.  There has been no change in bowel or bladder habits.  There has been no headache.  PHYSICAL EXAMINATION:  General:  This is a fairly well-developed, well- nourished white female in no obvious distress.  Vital signs: Temperature of 97.7, pulse 64, respiratory rate 14, blood pressure 157/64, and weight is 189 pounds.  Head and Neck:  Normocephalic, atraumatic skull.  There are no ocular or oral lesions.  There are no palpable cervical or supraclavicular lymph nodes.  Lungs:  Clear bilaterally.  Cardiac:  Regular rate and rhythm with normal S1, S2. There are no murmurs, rubs, or bruits.  Abdomen:  Soft.  She has good bowel sounds.  There is no fluid wave.  There is no palpable abdominal mass.  There is no palpable hepatosplenomegaly.  Back:  No tenderness over the spine, ribs, or hips.  Extremities:  Show no clubbing, cyanosis, or edema.  LABORATORY STUDIES:  White cell count is 10, hemoglobin 13.5, hematocrit 41, platelet count 161, and MCV is 93.  IMPRESSION:  Carmen [name] is a very nice 72 year old female with hemochromatosis.  Again, she is a double heterozygote.  We  have been phlebotomizing her and getting her iron down very nicely.  Her MCV is down even though a little bit lower.  Hopefully, this is a sign that her ferritin is still below 100.  We will give her a call with iron studies.  I want to see her back in about 2 months' time.    ______________________________ Josph Macho, M.D. PRE/MEDQ  D:  02/27/2013  T:  02/28/2013  Job:  [id number]

## 2013-03-03 ENCOUNTER — Telehealth: Payer: Self-pay | Admitting: Hematology & Oncology

## 2013-03-03 NOTE — Telephone Encounter (Signed)
Pt made 12-15 appointment

## 2013-03-04 NOTE — Addendum Note (Signed)
Addended by: Ileana Ladd on: 03/04/2013 09:52 AM   Modules accepted: Orders

## 2013-03-06 ENCOUNTER — Ambulatory Visit (HOSPITAL_BASED_OUTPATIENT_CLINIC_OR_DEPARTMENT_OTHER): Payer: Medicare Other

## 2013-03-06 NOTE — Patient Instructions (Signed)

## 2013-03-06 NOTE — Progress Notes (Signed)
Carmen [name] presents today for phlebotomy per MD orders. Phlebotomy procedure started at 1340 and ended at 1400. 500 grams removed. Patient observed for 30 minutes after procedure without any incident nourishments provided.  Patient tolerated procedure well. IV needle removed intact.

## 2013-05-04 ENCOUNTER — Other Ambulatory Visit (HOSPITAL_BASED_OUTPATIENT_CLINIC_OR_DEPARTMENT_OTHER): Payer: Medicare Other | Admitting: Lab

## 2013-05-04 ENCOUNTER — Encounter: Payer: Self-pay | Admitting: Hematology & Oncology

## 2013-05-04 ENCOUNTER — Ambulatory Visit (HOSPITAL_BASED_OUTPATIENT_CLINIC_OR_DEPARTMENT_OTHER): Payer: Medicare Other | Admitting: Hematology & Oncology

## 2013-05-04 LAB — CBC WITH DIFFERENTIAL (CANCER CENTER ONLY)
BASO#: 0 10*3/uL (ref 0.0–0.2)
BASO%: 0.3 % (ref 0.0–2.0)
EOS ABS: 0.4 10*3/uL (ref 0.0–0.5)
EOS%: 3.6 % (ref 0.0–7.0)
HCT: 41.3 % (ref 34.8–46.6)
HEMOGLOBIN: 13.5 g/dL (ref 11.6–15.9)
LYMPH#: 3.9 10*3/uL — AB (ref 0.9–3.3)
LYMPH%: 39.7 % (ref 14.0–48.0)
MCH: 29.6 pg (ref 26.0–34.0)
MCHC: 32.7 g/dL (ref 32.0–36.0)
MCV: 91 fL (ref 81–101)
MONO#: 0.7 10*3/uL (ref 0.1–0.9)
MONO%: 7.5 % (ref 0.0–13.0)
NEUT%: 48.9 % (ref 39.6–80.0)
NEUTROS ABS: 4.8 10*3/uL (ref 1.5–6.5)
Platelets: 161 10*3/uL (ref 145–400)
RBC: 4.56 10*6/uL (ref 3.70–5.32)
RDW: 14.6 % (ref 11.1–15.7)
WBC: 9.9 10*3/uL (ref 3.9–10.0)

## 2013-05-05 LAB — IRON AND TIBC CHCC
%SAT: 31 % (ref 21–57)
Iron: 90 ug/dL (ref 41–142)
TIBC: 295 ug/dL (ref 236–444)
UIBC: 205 ug/dL (ref 120–384)

## 2013-05-05 LAB — FERRITIN CHCC: FERRITIN: 83 ng/mL (ref 9–269)

## 2013-05-08 NOTE — Progress Notes (Signed)
  [facility] at [facility] [address] [place], [place].  [address] [phone] (fax)   Your Providers PCP: Vernie Shanks, MD,  [phone]) Referring Provider: Vernie Shanks, MD,  [phone])    DIAGNOSES:  Problem List Items Addressed This Visit   Hemochromatosis, hereditary - Primary (Chronic)   Relevant Orders      CBC with Differential ([facility])      Ferritin      Iron and TIBC      CURRENT THERAPY: Phlebotomy for ferritin > 100.  INTERIM HISTORY: no problems.  Feels ok.  No pain  PHYSICAL EXAMINATION: no liver or spleen.  Lungs are clear.  Heart RRR.  (-) HSM  (-) lef swelling   Vital signs:   Filed Vitals:   05/04/13 1500  BP: 139/51  Pulse: 63  Temp: 98.1 F (36.7 C)  TempSrc: Oral  Resp: 14  Height: 5' (1.524 m)  Weight: 190 lb (86.183 kg)      LABORATORY STUDIES:  CBC    Component Value Date/Time   WBC 9.9 05/04/2013 1438   WBC 10.7* 10/20/2012 1803   RBC CANCELED 10/31/2012 1723   RBC 4.6 10/20/2012 1803   HGB 13.5 05/04/2013 1438   HGB CANCELED 10/31/2012 1723   HGB 14.1 10/20/2012 1803   HCT 41.3 05/04/2013 1438   HCT CANCELED 10/31/2012 1723   HCT 42.5 10/20/2012 1803   PLT 161 05/04/2013 1438   PLT CANCELED 10/31/2012 1723   MCV 91 05/04/2013 1438   MCV 91.4 10/20/2012 1803   MCH 29.6 05/04/2013 1438   MCH 30.4 10/20/2012 1803   MCHC 32.7 05/04/2013 1438   MCHC 33.3 10/20/2012 1803   RDW 14.6 05/04/2013 1438   LYMPHSABS 3.9* 05/04/2013 1438   LYMPHSABS CANCELED 10/31/2012 1723   EOSABS 0.4 05/04/2013 1438   EOSABS CANCELED 10/31/2012 1723   BASOSABS 0.0 05/04/2013 1438   BASOSABS CANCELED 10/31/2012 1723     IMPRESSION: Ms. Charley is a 73 y.o. female with a history of hemochromatosis.  Phlebotomies are helping.Marland Kitchen  PLAN: We will plan to get her back to see [name] in another 2 months. If there is any problem in between visits, she can certainly come back and see [name].    ______________________________  Volanda Napoleon, M.D.  05/08/2013 8:42 PM

## 2013-05-09 ENCOUNTER — Encounter: Payer: Self-pay | Admitting: Nurse Practitioner

## 2013-05-10 ENCOUNTER — Telehealth: Payer: Self-pay | Admitting: Hematology & Oncology

## 2013-05-10 NOTE — Telephone Encounter (Signed)
Left pt message with 4-13 appointment

## 2013-06-06 ENCOUNTER — Other Ambulatory Visit: Payer: Self-pay | Admitting: Family Medicine

## 2013-06-20 ENCOUNTER — Ambulatory Visit (INDEPENDENT_AMBULATORY_CARE_PROVIDER_SITE_OTHER): Payer: Medicare Other | Admitting: Family Medicine

## 2013-06-20 ENCOUNTER — Ambulatory Visit (INDEPENDENT_AMBULATORY_CARE_PROVIDER_SITE_OTHER): Payer: Medicare Other

## 2013-06-20 ENCOUNTER — Encounter: Payer: Self-pay | Admitting: Family Medicine

## 2013-06-20 VITALS — BP 155/74 | HR 73 | Temp 98.4°F | Ht 61.0 in | Wt 189.4 lb

## 2013-06-20 DIAGNOSIS — Z951 Presence of aortocoronary bypass graft: Secondary | ICD-10-CM

## 2013-06-20 DIAGNOSIS — E785 Hyperlipidemia, unspecified: Secondary | ICD-10-CM

## 2013-06-20 DIAGNOSIS — M5412 Radiculopathy, cervical region: Secondary | ICD-10-CM

## 2013-06-20 DIAGNOSIS — M129 Arthropathy, unspecified: Secondary | ICD-10-CM

## 2013-06-20 DIAGNOSIS — G709 Myoneural disorder, unspecified: Secondary | ICD-10-CM

## 2013-06-20 DIAGNOSIS — M1990 Unspecified osteoarthritis, unspecified site: Secondary | ICD-10-CM

## 2013-06-20 DIAGNOSIS — I493 Ventricular premature depolarization: Secondary | ICD-10-CM

## 2013-06-20 DIAGNOSIS — I4949 Other premature depolarization: Secondary | ICD-10-CM

## 2013-06-20 DIAGNOSIS — I2510 Atherosclerotic heart disease of native coronary artery without angina pectoris: Secondary | ICD-10-CM

## 2013-06-20 DIAGNOSIS — M109 Gout, unspecified: Secondary | ICD-10-CM

## 2013-06-20 DIAGNOSIS — I10 Essential (primary) hypertension: Secondary | ICD-10-CM

## 2013-06-20 DIAGNOSIS — E1149 Type 2 diabetes mellitus with other diabetic neurological complication: Secondary | ICD-10-CM

## 2013-06-20 LAB — POCT GLYCOSYLATED HEMOGLOBIN (HGB A1C): Hemoglobin A1C: 6

## 2013-06-20 MED ORDER — AMLODIPINE BESYLATE 2.5 MG PO TABS: ORAL_TABLET | ORAL | Status: DC

## 2013-06-20 NOTE — Progress Notes (Signed)
Patient ID: Carmen [name], female   DOB: 1940-09-05, 73 y.o.   MRN: [id number] SUBJECTIVE: CC: Chief Complaint  Patient presents with  . Follow-up    3 month follow up    QGB:EEFEOFH is here for follow up of Diabetes Mellitus/HLD/HTN/Hemochromatosis: Symptoms evaluated: Denies Nocturia ,Denies Urinary Frequency , denies Blurred vision ,deniesDizziness,denies.Dysuria,denies paresthesias, denies extremity pain or ulcers.Carmen [name] chest pain. has had an annual eye exam. do check the feet. Does check CBGs. Average QRF:[id number] Denies episodes of hypoglycemia. Does have an emergency hypoglycemic plan. admits toCompliance with medications. Problems with medications.muscle aches with crestor. She has had problems with statins. But takes the crestor three times a week.  Also when she was beingphlebotomized she had her arm outstretched and since then she has had a pain in the left neck down to the left trapezius area to the shoulder. This is aggravated by rotation of the neck.  Also, she has been out of the amlodipine for 2 weeks.  Past Medical History  Diagnosis Date  . Anemia   . Hypertension   . Arthritis   . Hyperlipidemia   . Diverticulosis   . Gout   . Diabetes mellitus without complication   . CAD (coronary artery disease)   . Hemochromatosis, hereditary 11/24/2012  . Neuromuscular disorder     peripheral neuropathy   Past Surgical History  Procedure Laterality Date  . Lipoma excision      back  . Breast surgery      left breast lump--benign  . Cardiac catheterization  06/03/2005  . Coronary artery bypass graft  2007    x2   History   Social History  . Marital Status: Married    Spouse Name: N/A    Number of Children: N/A  . Years of Education: N/A   Occupational History  . Not on file.   Social History Main Topics  . Smoking status: Never Smoker   . Smokeless tobacco: Never Used     Comment: never used product  . Alcohol Use: No  . Drug Use: No  . Sexual  Activity: Not on file   Other Topics Concern  . Not on file   Social History Narrative  . No narrative on file   No family history on file. Current Outpatient Prescriptions on File Prior to Visit  Medication Sig Dispense Refill  . allopurinol (ZYLOPRIM) 100 MG tablet Take 100 mg by mouth 2 (two) times daily.      Carmen Kitchen aspirin 81 MG tablet Take 81 mg by mouth daily.      . colchicine 0.6 MG tablet Take 0.6 mg by mouth as needed.      . fluticasone (FLONASE) 50 MCG/ACT nasal spray Place 1 spray into the nose as needed.       . metoprolol tartrate (LOPRESSOR) 25 MG tablet TAKE 1 TABLET TWICE A DAY  180 tablet  5  . omega-3 acid ethyl esters (LOVAZA) 1 G capsule TAKE 2 CAPSULES BY MOUTH TWICE A DAY  120 capsule  3  . ONE TOUCH ULTRA TEST test strip USE FOR TESTING TWICE DAILY  100 each  1  . pregabalin (LYRICA) 50 MG capsule TAKE 1 CAPSULE 2 TIMES A DAY  60 capsule  11  . rosuvastatin (CRESTOR) 5 MG tablet Take 5 mg by mouth 3 (three) times a week.      . sitaGLIPtin (JANUVIA) 100 MG tablet Take 1 tablet (100 mg total) by mouth daily.  30 tablet  11  . triamterene-hydrochlorothiazide (DYAZIDE)  37.5-25 MG per capsule TAKE 1 CAPSULE BY MOUTH DAILY  30 capsule  11  . valsartan (DIOVAN) 160 MG tablet TAKE 1 TABLET EVERY DAY  30 tablet  11  . VOLTAREN 1 % GEL Apply 2 g topically as needed.        No current facility-administered medications on file prior to visit.   Allergies  Allergen Reactions  . Crestor [Rosuvastatin]     Muscle aches  On high doses  . Zetia [Ezetimibe]   . Zocor [Simvastatin]     Muscle aches   . Sulfa Antibiotics Rash   Immunization History  Administered Date(s) Administered  . Influenza, High Dose Seasonal PF 12/09/2012   Prior to Admission medications   Medication Sig Start Date End Date Taking? Authorizing Provider  allopurinol (ZYLOPRIM) 100 MG tablet Take 100 mg by mouth 2 (two) times daily.   Yes Historical Provider, MD  amLODipine (NORVASC) 2.5 MG tablet  TAKE 1 TABLET (2.5 MG TOTAL) BY MOUTH DAILY. 02/19/13  Yes Vernie Shanks, MD  aspirin 81 MG tablet Take 81 mg by mouth daily.   Yes Historical Provider, MD  colchicine 0.6 MG tablet Take 0.6 mg by mouth as needed. 10/20/12  Yes Vernie Shanks, MD  fluticasone (FLONASE) 50 MCG/ACT nasal spray Place 1 spray into the nose as needed.  10/28/12  Yes Chipper Herb, MD  metoprolol tartrate (LOPRESSOR) 25 MG tablet TAKE 1 TABLET TWICE A DAY 02/28/13  Yes Vernie Shanks, MD  omega-3 acid ethyl esters (LOVAZA) 1 G capsule TAKE 2 CAPSULES BY MOUTH TWICE A DAY   Yes Vernie Shanks, MD  ONE TOUCH ULTRA TEST test strip USE FOR TESTING TWICE DAILY 01/18/13  Yes Vernie Shanks, MD  pregabalin (LYRICA) 50 MG capsule TAKE 1 CAPSULE 2 TIMES A DAY 02/28/13  Yes Vernie Shanks, MD  rosuvastatin (CRESTOR) 5 MG tablet Take 5 mg by mouth 3 (three) times a week.   Yes Historical Provider, MD  sitaGLIPtin (JANUVIA) 100 MG tablet Take 1 tablet (100 mg total) by mouth daily. 10/20/12  Yes Vernie Shanks, MD  triamterene-hydrochlorothiazide (DYAZIDE) 37.5-25 MG per capsule TAKE 1 CAPSULE BY MOUTH DAILY 10/20/12  Yes Vernie Shanks, MD  valsartan (DIOVAN) 160 MG tablet TAKE 1 TABLET EVERY DAY 10/20/12  Yes Vernie Shanks, MD  VOLTAREN 1 % GEL Apply 2 g topically as needed.  12/22/12  Yes Historical Provider, MD     ROS: As above in the HPI. All other systems are stable or negative.  OBJECTIVE: APPEARANCE:  Patient in no acute distress.The patient appeared well nourished and normally developed. Acyanotic. Waist: VITAL SIGNS:BP 155/74  Pulse 73  Temp(Src) 98.4 F (36.9 C) (Oral)  Ht _0  (1.549 m)  Wt 189 lb 6.4 oz (85.911 kg)  BMI 35.81 kg/m2 WF. obese  SKIN: warm and  Dry without overt rashes, tattoos and scars  HEAD and Neck: without JVD, Head and scalp: normal Eyes:No scleral icterus. Fundi normal, eye movements normal. Ears: Auricle normal, canal normal, Tympanic membranes normal, insufflation normal. Nose:  normal Throat: normal Neck: reduced rotation to the left.radicular pain reproduced. thyroid: normal  CHEST & LUNGS: Chest wall: normal Lungs: Clear  CVS: Reveals the PMI to be normally located. Regular rhythm, First and Second Heart sounds are normal,  absence of murmurs, rubs or gallops. Peripheral vasculature: Radial pulses: normal Dorsal pedis pulses: normal Posterior pulses: normal  ABDOMEN:  Appearance: normal Benign, no organomegaly, no masses, no  Abdominal Aortic enlargement. No Guarding , no rebound. No Bruits. Bowel sounds: normal  RECTAL: N/A GU: N/A  EXTREMETIES: nonedematous.  MUSCULOSKELETAL:  Spine: cervical spine has reduced rom. Joints: intact  NEUROLOGIC: oriented to time,place and person; nonfocal. Strength is normal Sensory is normal Reflexes are normal Cranial Nerves are normal.  ASSESSMENT:  S/P CABG x 2  Neuromuscular disorder  HTN (hypertension) - Plan: CMP14+EGFR  HLD (hyperlipidemia) - Plan: CMP14+EGFR, NMR, lipoprofile  Hemochromatosis, hereditary  DM (diabetes mellitus) type II controlled, neurological manifestation - Plan: POCT glycosylated hemoglobin (Hb A1C)  CAD (coronary artery disease)  PVC's (premature ventricular contractions)  Arthritis  Gout - Plan: Uric acid  Left cervical radiculopathy - Plan: DG Cervical Spine Complete, Ambulatory referral to Physical Therapy  BP elvated due to being out of amlodipine for 2 Weeks.Carmen Kitchen   PLAN:       Dr Paula Libra Recommendations  For nutrition information, I recommend books:  1).Eat to Live by Dr Excell Seltzer. 2).Prevent and Reverse Heart Disease by Dr Karl Luke. 3) Dr Janene Harvey Book:  Program to Reverse Diabetes  Exercise recommendations are:  If unable to walk, then the patient can exercise in a chair 3 times a day. By flapping arms like a bird gently and raising legs outwards to the front.  If ambulatory, the patient can go for walks for 30 minutes  3 times a week. Then increase the intensity and duration as tolerated.  Goal is to try to attain exercise frequency to 5 times a week.  If applicable: Best to perform resistance exercises (machines or weights) 2 days a week and cardio type exercises 3 days per week.   DASH diet in the AVS  Orders Placed This Encounter  Procedures  . DG Cervical Spine Complete    Standing Status: Future     Number of Occurrences: 1     Standing Expiration Date: 08/21/2014    Order Specific Question:  Reason for Exam (SYMPTOM  OR DIAGNOSIS REQUIRED)    Answer:  radicular neck pain    Order Specific Question:  Preferred imaging location?    Answer:  Internal  . CMP14+EGFR  . NMR, lipoprofile  . Uric acid  . Ambulatory referral to Physical Therapy    Referral Priority:  Routine    Referral Type:  Physical Medicine    Referral Reason:  Specialty Services Required    Requested Specialty:  Physical Therapy    Number of Visits Requested:  1  . POCT glycosylated hemoglobin (Hb A1C)  [facility] reading (PRIMARY) by  Dr. Jacelyn Grip:  C4-C5 degenerative disease. With possible foraminal narrowing                    Will refer to PT for evaluation and treatment with the cervical radiculopathy. Advised patient to be compliant with her medications and not to run out of her medications. Risks discussed. Meds ordered this encounter  Medications  . amLODipine (NORVASC) 2.5 MG tablet    Sig: TAKE 1 TABLET (2.5 MG TOTAL) BY MOUTH DAILY.    Dispense:  30 tablet    Refill:  6   Medications Discontinued During This Encounter  Medication Reason  . amLODipine (NORVASC) 2.5 MG tablet Reorder   Return in about 2 weeks (around 07/04/2013) for recheck BP with nurse, routine follow up in 6 months.  Jaquan Sadowsky P. Jacelyn Grip, M.D.

## 2013-06-20 NOTE — Patient Instructions (Signed)
DASH Diet The DASH diet stands for "Dietary Approaches to Stop Hypertension." It is a healthy eating plan that has been shown to reduce high blood pressure (hypertension) in as little as 14 days, while also possibly providing other significant health benefits. These other health benefits include reducing the risk of breast cancer after menopause and reducing the risk of type 2 diabetes, heart disease, colon cancer, and stroke. Health benefits also include weight loss and slowing kidney failure in patients with chronic kidney disease.  DIET GUIDELINES  Limit salt (sodium). Your diet should contain less than 1500 mg of sodium daily.  Limit refined or processed carbohydrates. Your diet should include mostly whole grains. Desserts and added sugars should be used sparingly.  Include small amounts of heart-healthy fats. These types of fats include nuts, oils, and tub margarine. Limit saturated and trans fats. These fats have been shown to be harmful in the body. CHOOSING FOODS  The following food groups are based on a 2000 calorie diet. See your Registered Dietitian for individual calorie needs. Grains and Grain Products (6 to 8 servings daily)  Eat More Often: Whole-wheat bread, brown rice, whole-grain or wheat pasta, quinoa, popcorn without added fat or salt (air popped).  Eat Less Often: White bread, white pasta, white rice, cornbread. Vegetables (4 to 5 servings daily)  Eat More Often: Fresh, frozen, and canned vegetables. Vegetables may be raw, steamed, roasted, or grilled with a minimal amount of fat.  Eat Less Often/Avoid: Creamed or fried vegetables. Vegetables in a cheese sauce. Fruit (4 to 5 servings daily)  Eat More Often: All fresh, canned (in natural juice), or frozen fruits. Dried fruits without added sugar. One hundred percent fruit juice ( cup [237 mL] daily).  Eat Less Often: Dried fruits with added sugar. Canned fruit in light or heavy syrup. Lean Meats, Fish, and Poultry (2  servings or less daily. One serving is 3 to 4 oz [85-114 g]).  Eat More Often: Ninety percent or leaner ground beef, tenderloin, sirloin. Round cuts of beef, chicken breast, turkey breast. All fish. Grill, bake, or broil your meat. Nothing should be fried.  Eat Less Often/Avoid: Fatty cuts of meat, turkey, or chicken leg, thigh, or wing. Fried cuts of meat or fish. Dairy (2 to 3 servings)  Eat More Often: Low-fat or fat-free milk, low-fat plain or light yogurt, reduced-fat or part-skim cheese.  Eat Less Often/Avoid: Milk (whole, 2%).Whole milk yogurt. Full-fat cheeses. Nuts, Seeds, and Legumes (4 to 5 servings per week)  Eat More Often: All without added salt.  Eat Less Often/Avoid: Salted nuts and seeds, canned beans with added salt. Fats and Sweets (limited)  Eat More Often: Vegetable oils, tub margarines without trans fats, sugar-free gelatin. Mayonnaise and salad dressings.  Eat Less Often/Avoid: Coconut oils, palm oils, butter, stick margarine, cream, half and half, cookies, candy, pie. FOR MORE INFORMATION The Dash Diet Eating Plan: www.dashdiet.org Document Released: 02/26/2011 Document Revised: 06/01/2011 Document Reviewed: 02/26/2011 ExitCare Patient Information 2014 [employer].        Dr Perry Molla's Recommendations  For nutrition information, I recommend books:  1).Eat to Live by Dr Joel Fuhrman. 2).Prevent and Reverse Heart Disease by Dr Caldwell Esselstyn. 3) Dr Neal Barnard's Book:  Program to Reverse Diabetes  Exercise recommendations are:  If unable to walk, then the patient can exercise in a chair 3 times a day. By flapping arms like a bird gently and raising legs outwards to the front.  If ambulatory, the patient can go for   walks for 30 minutes 3 times a week. Then increase the intensity and duration as tolerated.  Goal is to try to attain exercise frequency to 5 times a week.  If applicable: Best to perform resistance exercises (machines or  weights) 2 days a week and cardio type exercises 3 days per week.  

## 2013-06-21 LAB — CMP14+EGFR
ALT: 27 IU/L (ref 0–32)
AST: 28 IU/L (ref 0–40)
Albumin/Globulin Ratio: 1.4 (ref 1.1–2.5)
Albumin: 4.6 g/dL (ref 3.5–4.8)
Alkaline Phosphatase: 70 IU/L (ref 39–117)
BUN/Creatinine Ratio: 18 (ref 11–26)
BUN: 22 mg/dL (ref 8–27)
CO2: 26 mmol/L (ref 18–29)
Calcium: 10.7 mg/dL — ABNORMAL HIGH (ref 8.7–10.3)
Chloride: 98 mmol/L (ref 97–108)
Creatinine, Ser: 1.21 mg/dL — ABNORMAL HIGH (ref 0.57–1.00)
GFR calc Af Amer: 51 mL/min/{1.73_m2} — ABNORMAL LOW (ref >59)
GFR calc non Af Amer: 44 mL/min/{1.73_m2} — ABNORMAL LOW (ref >59)
Globulin, Total: 3.2 g/dL (ref 1.5–4.5)
Glucose: 97 mg/dL (ref 65–99)
Potassium: 5 mmol/L (ref 3.5–5.2)
Sodium: 141 mmol/L (ref 134–144)
Total Bilirubin: 0.4 mg/dL (ref 0.0–1.2)
Total Protein: 7.8 g/dL (ref 6.0–8.5)

## 2013-06-21 LAB — URIC ACID: Uric Acid: 5.7 mg/dL (ref 2.5–7.1)

## 2013-06-21 LAB — NMR, LIPOPROFILE
Cholesterol: 295 mg/dL — ABNORMAL HIGH (ref <200)
HDL Cholesterol by NMR: 39 mg/dL — ABNORMAL LOW (ref >=40)
HDL Particle Number: 26.3 umol/L — ABNORMAL LOW (ref >=30.5)
LDL Particle Number: 3382 nmol/L — ABNORMAL HIGH (ref <1000)
LDL Size: 20.1 nm — ABNORMAL LOW (ref >20.5)
LDLC SERPL CALC-MCNC: 183 mg/dL — ABNORMAL HIGH (ref <100)
LP-IR Score: 66 — ABNORMAL HIGH (ref <=45)
Small LDL Particle Number: >2300 nmol/L — ABNORMAL HIGH (ref <=527)
Triglycerides by NMR: 363 mg/dL — ABNORMAL HIGH (ref <150)

## 2013-06-26 ENCOUNTER — Telehealth: Payer: Self-pay | Admitting: Family Medicine

## 2013-06-26 NOTE — Telephone Encounter (Signed)
She is supposed to come in for a follow up next week. She needs to ge an application at the [facility] and bring it next week.

## 2013-06-27 NOTE — Telephone Encounter (Signed)
Pt aware.

## 2013-06-29 ENCOUNTER — Ambulatory Visit: Payer: Medicare Other | Attending: Family Medicine

## 2013-06-29 DIAGNOSIS — M5412 Radiculopathy, cervical region: Secondary | ICD-10-CM | POA: Insufficient documentation

## 2013-06-29 DIAGNOSIS — M542 Cervicalgia: Secondary | ICD-10-CM | POA: Diagnosis not present

## 2013-06-29 DIAGNOSIS — IMO0001 Reserved for inherently not codable concepts without codable children: Secondary | ICD-10-CM | POA: Diagnosis present

## 2013-07-03 ENCOUNTER — Ambulatory Visit: Payer: Medicare Other | Admitting: Hematology & Oncology

## 2013-07-03 ENCOUNTER — Other Ambulatory Visit: Payer: Medicare Other | Admitting: Lab

## 2013-07-04 ENCOUNTER — Ambulatory Visit: Payer: Medicare Other | Admitting: Physical Therapy

## 2013-07-04 DIAGNOSIS — IMO0001 Reserved for inherently not codable concepts without codable children: Secondary | ICD-10-CM | POA: Diagnosis not present

## 2013-07-06 ENCOUNTER — Ambulatory Visit: Payer: Medicare Other | Admitting: Physical Therapy

## 2013-07-06 DIAGNOSIS — IMO0001 Reserved for inherently not codable concepts without codable children: Secondary | ICD-10-CM | POA: Diagnosis not present

## 2013-07-11 ENCOUNTER — Ambulatory Visit: Payer: Medicare Other | Admitting: Physical Therapy

## 2013-07-11 DIAGNOSIS — IMO0001 Reserved for inherently not codable concepts without codable children: Secondary | ICD-10-CM | POA: Diagnosis not present

## 2013-07-13 ENCOUNTER — Ambulatory Visit: Payer: Medicare Other | Admitting: Physical Therapy

## 2013-07-13 DIAGNOSIS — IMO0001 Reserved for inherently not codable concepts without codable children: Secondary | ICD-10-CM | POA: Diagnosis not present

## 2013-07-18 ENCOUNTER — Ambulatory Visit: Payer: Medicare Other | Admitting: Physical Therapy

## 2013-07-18 DIAGNOSIS — IMO0001 Reserved for inherently not codable concepts without codable children: Secondary | ICD-10-CM | POA: Diagnosis not present

## 2013-07-20 ENCOUNTER — Encounter: Payer: Self-pay | Admitting: Hematology & Oncology

## 2013-07-20 ENCOUNTER — Ambulatory Visit (HOSPITAL_BASED_OUTPATIENT_CLINIC_OR_DEPARTMENT_OTHER): Payer: Medicare Other | Admitting: Hematology & Oncology

## 2013-07-20 ENCOUNTER — Other Ambulatory Visit (HOSPITAL_BASED_OUTPATIENT_CLINIC_OR_DEPARTMENT_OTHER): Payer: Medicare Other | Admitting: Lab

## 2013-07-20 LAB — CBC WITH DIFFERENTIAL (CANCER CENTER ONLY)
BASO#: 0.1 10*3/uL (ref 0.0–0.2)
BASO%: 0.5 % (ref 0.0–2.0)
EOS%: 3.8 % (ref 0.0–7.0)
Eosinophils Absolute: 0.4 10*3/uL (ref 0.0–0.5)
HCT: 42.3 % (ref 34.8–46.6)
HGB: 14.2 g/dL (ref 11.6–15.9)
LYMPH#: 3.5 10*3/uL — AB (ref 0.9–3.3)
LYMPH%: 37.7 % (ref 14.0–48.0)
MCH: 30.8 pg (ref 26.0–34.0)
MCHC: 33.6 g/dL (ref 32.0–36.0)
MCV: 92 fL (ref 81–101)
MONO#: 0.7 10*3/uL (ref 0.1–0.9)
MONO%: 7 % (ref 0.0–13.0)
NEUT#: 4.7 10*3/uL (ref 1.5–6.5)
NEUT%: 51 % (ref 39.6–80.0)
Platelets: 156 10*3/uL (ref 145–400)
RBC: 4.61 10*6/uL (ref 3.70–5.32)
RDW: 14 % (ref 11.1–15.7)
WBC: 9.2 10*3/uL (ref 3.9–10.0)

## 2013-07-20 LAB — IRON AND TIBC CHCC
%SAT: 36 % (ref 21–57)
Iron: 99 ug/dL (ref 41–142)
TIBC: 275 ug/dL (ref 236–444)
UIBC: 176 ug/dL (ref 120–384)

## 2013-07-20 LAB — FERRITIN CHCC: Ferritin: 103 ng/ml (ref 9–269)

## 2013-07-20 NOTE — Progress Notes (Signed)
Hematology and Oncology Follow Up Visit  Carmen [name] [id number] [birth year] 73 y.o. 07/20/2013   Principle Diagnosis:  Hemochromatosis (double heterozygote for C282Y and S65C mutations).  Current Therapy:    Phlebotomy to maintain ferritin less than 100     Interim History:  [name] is back for followup. When I saw her back in February. Since then, she's been doing quite well. Unfortunately we can't phlebotomize her for a while. Back in February, her ferritin was only 83. Her iron saturation was 31%.  As always, she and her husband will be going off on another trip. He'll be going over to [place] in the summertime in September. She's had no other health issues. She is does have pain in the left arm. She did some damage to her left arm and neck. She may have a little bit of a brachial plexus issue. Continue therapy for this.  Is no abdominal pain. She's had no change in bowel or bladder habits. She's had no cough. There's been no rashes. No known  Medications: Current outpatient prescriptions:allopurinol (ZYLOPRIM) 100 MG tablet, Take 100 mg by mouth 2 (two) times daily., Disp: , Rfl: ;  amLODipine (NORVASC) 2.5 MG tablet, TAKE 1 TABLET (2.5 MG TOTAL) BY MOUTH DAILY., Disp: 30 tablet, Rfl: 6;  aspirin 81 MG tablet, Take 81 mg by mouth daily., Disp: , Rfl: ;  fluticasone (FLONASE) 50 MCG/ACT nasal spray, Place 1 spray into the nose as needed. , Disp: , Rfl:  metoprolol tartrate (LOPRESSOR) 25 MG tablet, TAKE 1 TABLET TWICE A DAY, Disp: 180 tablet, Rfl: 5;  omega-3 acid ethyl esters (LOVAZA) 1 G capsule, TAKE 2 CAPSULES BY MOUTH TWICE A DAY, Disp: 120 capsule, Rfl: 3;  ONE TOUCH ULTRA TEST test strip, USE FOR TESTING TWICE DAILY, Disp: 100 each, Rfl: 1;  pregabalin (LYRICA) 50 MG capsule, TAKE 1 CAPSULE 2 TIMES A DAY, Disp: 60 capsule, Rfl: 11 rosuvastatin (CRESTOR) 5 MG tablet, Take 5 mg by mouth 3 (three) times a week., Disp: , Rfl: ;  sitaGLIPtin (JANUVIA) 100 MG tablet, Take 1 tablet  (100 mg total) by mouth daily., Disp: 30 tablet, Rfl: 11;  triamterene-hydrochlorothiazide (DYAZIDE) 37.5-25 MG per capsule, TAKE 1 CAPSULE BY MOUTH DAILY, Disp: 30 capsule, Rfl: 11;  valsartan (DIOVAN) 160 MG tablet, TAKE 1 TABLET EVERY DAY, Disp: 30 tablet, Rfl: 11 colchicine 0.6 MG tablet, Take 0.6 mg by mouth as needed., Disp: , Rfl: ;  VOLTAREN 1 % GEL, Apply 2 g topically as needed. , Disp: , Rfl:   Allergies:  Allergies  Allergen Reactions  . Crestor [Rosuvastatin]     Muscle aches  On high doses  . Zetia [Ezetimibe]   . Zocor [Simvastatin]     Muscle aches   . Sulfa Antibiotics Rash    Past Medical History, Surgical history, Social history, and Family History were reviewed and updated.  Review of Systems: As above  Physical Exam:  height is 5\' 3"  (1.6 m) and weight is 188 lb (85.276 kg). Her oral temperature is 97.6 F (36.4 C). Her blood pressure is 126/53 and her pulse is 60. Her respiration is 14.   Well developed and well nourished. Lungs are clear. Cardiac exam regular in rhythm. Neck without adenopathy. Ocular exam normal. Abdomen soft. No palpable liver or spleen. Back exam no tenderness. Extremities some slight chronic nonpitting edema. Joints no swelling. Skin exam no rashes. Neurological exam nonfocal.  Lab Results  Component Value Date   WBC 9.2 07/20/2013  HGB 14.2 07/20/2013   HCT 42.3 07/20/2013   MCV 92 07/20/2013   PLT 156 07/20/2013     Chemistry      Component Value Date/Time   NA 141 06/20/2013 1503   NA 139 11/24/2012 1150   K 5.0 06/20/2013 1503   CL 98 06/20/2013 1503   CO2 26 06/20/2013 1503   BUN 22 06/20/2013 1503   BUN 25* 11/24/2012 1150   CREATININE 1.21* 06/20/2013 1503   CREATININE 1.50* 06/13/2012 1005      Component Value Date/Time   CALCIUM 10.7* 06/20/2013 1503   ALKPHOS 70 06/20/2013 1503   AST 28 06/20/2013 1503   ALT 27 06/20/2013 1503   BILITOT 0.4 06/20/2013 1503         Impression and Plan: Carmen [name] is 73 year old white female  with hemachromatosis. She is a double heterozygote. We are phlebotomized her.  We will see what her ferritin is now. Again would not phlebotomize her for quite a while.  I probably will get her back in another 3 months. If we had to phlebotomize her, I still think we will be okay.   Volanda Napoleon, MD 4/30/201511:23 AM

## 2013-07-21 ENCOUNTER — Ambulatory Visit: Payer: Medicare Other | Attending: Family Medicine | Admitting: Physical Therapy

## 2013-07-21 ENCOUNTER — Telehealth: Payer: Self-pay | Admitting: Hematology & Oncology

## 2013-07-21 DIAGNOSIS — IMO0001 Reserved for inherently not codable concepts without codable children: Secondary | ICD-10-CM | POA: Diagnosis present

## 2013-07-21 DIAGNOSIS — M5412 Radiculopathy, cervical region: Secondary | ICD-10-CM | POA: Diagnosis not present

## 2013-07-21 DIAGNOSIS — M542 Cervicalgia: Secondary | ICD-10-CM | POA: Insufficient documentation

## 2013-07-21 NOTE — Telephone Encounter (Signed)
Pt aware of 5-5 phlebotomy.

## 2013-07-24 ENCOUNTER — Ambulatory Visit: Payer: Medicare Other | Admitting: Physical Therapy

## 2013-07-24 DIAGNOSIS — IMO0001 Reserved for inherently not codable concepts without codable children: Secondary | ICD-10-CM | POA: Diagnosis not present

## 2013-07-25 ENCOUNTER — Ambulatory Visit (HOSPITAL_BASED_OUTPATIENT_CLINIC_OR_DEPARTMENT_OTHER): Payer: Medicare Other

## 2013-07-25 NOTE — Progress Notes (Signed)
Carmen [name] presents today for phlebotomy per MD orders. Phlebotomy procedure started at 1145 and ended at 1240. 500 mls removed. Due to small size of vessels, used 20G needle, one site R antecubital without good results, needle removed, cannulated left hand with 20 G needle with adequate blood flow.   Patient observed for 30 minutes after procedure without any incident. Patient tolerated procedure well. IV needle removed intact.

## 2013-07-25 NOTE — Patient Instructions (Signed)

## 2013-07-27 ENCOUNTER — Ambulatory Visit: Payer: Medicare Other | Admitting: Physical Therapy

## 2013-07-27 DIAGNOSIS — IMO0001 Reserved for inherently not codable concepts without codable children: Secondary | ICD-10-CM | POA: Diagnosis not present

## 2013-08-01 ENCOUNTER — Ambulatory Visit: Payer: Medicare Other | Admitting: Physical Therapy

## 2013-08-01 DIAGNOSIS — IMO0001 Reserved for inherently not codable concepts without codable children: Secondary | ICD-10-CM | POA: Diagnosis not present

## 2013-08-03 ENCOUNTER — Ambulatory Visit: Payer: Medicare Other | Admitting: Physical Therapy

## 2013-08-03 DIAGNOSIS — IMO0001 Reserved for inherently not codable concepts without codable children: Secondary | ICD-10-CM | POA: Diagnosis not present

## 2013-08-08 ENCOUNTER — Ambulatory Visit: Payer: Medicare Other | Admitting: Physical Therapy

## 2013-08-08 DIAGNOSIS — IMO0001 Reserved for inherently not codable concepts without codable children: Secondary | ICD-10-CM | POA: Diagnosis not present

## 2013-08-10 ENCOUNTER — Ambulatory Visit: Payer: Medicare Other | Admitting: Physical Therapy

## 2013-08-10 DIAGNOSIS — IMO0001 Reserved for inherently not codable concepts without codable children: Secondary | ICD-10-CM | POA: Diagnosis not present

## 2013-08-16 ENCOUNTER — Ambulatory Visit: Payer: Medicare Other | Admitting: Physical Therapy

## 2013-08-16 DIAGNOSIS — IMO0001 Reserved for inherently not codable concepts without codable children: Secondary | ICD-10-CM | POA: Diagnosis not present

## 2013-08-18 ENCOUNTER — Encounter: Payer: Self-pay | Admitting: Internal Medicine

## 2013-08-18 ENCOUNTER — Ambulatory Visit (INDEPENDENT_AMBULATORY_CARE_PROVIDER_SITE_OTHER): Payer: Medicare Other | Admitting: Internal Medicine

## 2013-08-18 ENCOUNTER — Telehealth: Payer: Self-pay | Admitting: *Deleted

## 2013-08-18 VITALS — BP 152/70 | HR 62 | Ht 63.0 in | Wt 189.7 lb

## 2013-08-18 DIAGNOSIS — E785 Hyperlipidemia, unspecified: Secondary | ICD-10-CM

## 2013-08-18 DIAGNOSIS — I10 Essential (primary) hypertension: Secondary | ICD-10-CM

## 2013-08-18 DIAGNOSIS — Z951 Presence of aortocoronary bypass graft: Secondary | ICD-10-CM

## 2013-08-18 DIAGNOSIS — I4949 Other premature depolarization: Secondary | ICD-10-CM

## 2013-08-18 DIAGNOSIS — E1149 Type 2 diabetes mellitus with other diabetic neurological complication: Secondary | ICD-10-CM

## 2013-08-18 DIAGNOSIS — I493 Ventricular premature depolarization: Secondary | ICD-10-CM

## 2013-08-18 DIAGNOSIS — Z789 Other specified health status: Secondary | ICD-10-CM | POA: Insufficient documentation

## 2013-08-18 DIAGNOSIS — I2510 Atherosclerotic heart disease of native coronary artery without angina pectoris: Secondary | ICD-10-CM

## 2013-08-18 DIAGNOSIS — Z888 Allergy status to other drugs, medicaments and biological substances status: Secondary | ICD-10-CM

## 2013-08-18 NOTE — Telephone Encounter (Signed)
Patient notified that in order to be considered for a PCSK9 trial with Alferd Apa, Pharmacist, she would need an hs-CRP lab test done. Lab is ordered and patient with have blood work Monday June 6 or Tuesday June 7 at [facility]. Patient is scheduled to see Alferd Apa on 6/9

## 2013-08-18 NOTE — Patient Instructions (Signed)
Dr. Debara Pickett has referred you to [facility], pharmacist (at [facility] location) for possible PCSK9 trial (to manage cholesterol)  Your physician wants you to follow-up in: 1 year. You will receive a reminder letter in the mail two months in advance. If you don't receive a letter, please call our office to schedule the follow-up appointment.

## 2013-08-18 NOTE — Progress Notes (Signed)
OFFICE NOTE  Chief Complaint:  Annual office visit  Primary Care Physician: Anthoney Harada, MD  HPI:  Carmen [name] is a 73 year-old female comes in for followup of coronary disease. She had bypass surgery in 2007 and at that time had a 40% ejection fraction. She had a nuclear study in May of 2012 that was a low-risk study, but we could not calculate an ejection fraction because of ectopy. She has had no episodes of angina, no unusual shortness of breath unless she walks up a hill or a flight of steps, and then she becomes mildly breathless. She does not have PND or orthopnea. She does not have a productive cough. She has had no awareness of any arrhythmias, no dizziness, no syncope. She does have mild left ankle edema that is dependent in nature. She wears support hose infrequently for this. She has recurrent problems with gout, usually in her great toe. She has made some significant dietary changes, and the gout episodes seem to be less.  She has been diabetic for around 2 years. She has a hemoglobin A1c of 5.8, has had no significant episodes of hypoglycemia. She has noted some proximal lower extremity weakness. She gave an example working in her garden, when she tried to get up her legs were so weak that she had to get up on all fours, and one time her husband had to get her up. She has gone through physical therapy recently because of right hip bursitis, and it may very well be that she needs additional physical therapy for proximal muscle weakness and core strength building. No significant problems from the pollen. She has had some problems with constipation. Otherwise, no new complaints today.  Of note she's had a markedly abnormal lipid profile with particle numbers greater than 3000. She has been intolerant to all statins and has failed Crestor, Zetia, Zocor, Lipitor and Livalo in the past. I feel that she remains at very high risk of cardiac events and there is a great  likelihood that she may develop premature graft failure with her high cholesterol numbers. We did discuss the new PCSK9 inhibitors, for which she may be a great candidate if she is willing to give herself an injection every 2-4 weeks.Marland Kitchen  PMHx:  Past Medical History  Diagnosis Date  . Anemia   . Hypertension   . Arthritis   . Hyperlipidemia   . Diverticulosis   . Gout   . Diabetes mellitus without complication   . CAD (coronary artery disease)   . Hemochromatosis, hereditary 11/24/2012  . Neuromuscular disorder     peripheral neuropathy    Past Surgical History  Procedure Laterality Date  . Lipoma excision      back  . Breast surgery      left breast lump--benign  . Cardiac catheterization  06/03/2005  . Coronary artery bypass graft  2007    x2    FAMHx:  History reviewed. No pertinent family history.  SOCHx:   reports that she has never smoked. She has never used smokeless tobacco. She reports that she does not drink alcohol or use illicit drugs.  ALLERGIES:  Allergies  Allergen Reactions  . Crestor [Rosuvastatin]     Muscle aches  On high doses  . Zetia [Ezetimibe]   . Zocor [Simvastatin]     Muscle aches   . Sulfa Antibiotics Rash    ROS: A comprehensive review of systems was negative except for: Musculoskeletal: positive for back pain and muscle weakness  HOME MEDS: Current Outpatient Prescriptions  Medication Sig Dispense Refill  . allopurinol (ZYLOPRIM) 100 MG tablet Take 100 mg by mouth 2 (two) times daily.      Marland Kitchen amLODipine (NORVASC) 2.5 MG tablet TAKE 1 TABLET (2.5 MG TOTAL) BY MOUTH DAILY.  30 tablet  6  . aspirin 81 MG tablet Take 81 mg by mouth daily.      . colchicine 0.6 MG tablet Take 0.6 mg by mouth as needed.      . fluticasone (FLONASE) 50 MCG/ACT nasal spray Place 1 spray into the nose as needed.       . metoprolol tartrate (LOPRESSOR) 25 MG tablet TAKE 1 TABLET TWICE A DAY  180 tablet  5  . omega-3 acid ethyl esters (LOVAZA) 1 G capsule TAKE 2  CAPSULES BY MOUTH TWICE A DAY  120 capsule  3  . pregabalin (LYRICA) 50 MG capsule TAKE 1 CAPSULE 2 TIMES A DAY  60 capsule  11  . rosuvastatin (CRESTOR) 5 MG tablet Take 5 mg by mouth 3 (three) times a week.      . sitaGLIPtin (JANUVIA) 100 MG tablet Take 1 tablet (100 mg total) by mouth daily.  30 tablet  11  . triamterene-hydrochlorothiazide (DYAZIDE) 37.5-25 MG per capsule TAKE 1 CAPSULE BY MOUTH DAILY  30 capsule  11  . valsartan (DIOVAN) 160 MG tablet TAKE 1 TABLET EVERY DAY  30 tablet  11  . VOLTAREN 1 % GEL Apply 2 g topically as needed.        No current facility-administered medications for this visit.    LABS/IMAGING: No results found for this or any previous visit (from the past 48 hour(s)). No results found.  VITALS: BP 152/70  Pulse 62  Ht 5\' 3"  (1.6 m)  Wt 189 lb 11.2 oz (86.047 kg)  BMI 33.61 kg/m2  EXAM: General appearance: alert and no distress Neck: no adenopathy, no carotid bruit, no JVD, supple, symmetrical, trachea midline and thyroid not enlarged, symmetric, no tenderness/mass/nodules Lungs: clear to auscultation bilaterally Heart: regular rate and rhythm, S1, S2 normal, no murmur, click, rub or gallop Abdomen: soft, non-tender; bowel sounds normal; no masses,  no organomegaly Extremities: extremities normal, atraumatic, no cyanosis or edema Pulses: 2+ and symmetric Skin: Skin color, texture, turgor normal. No rashes or lesions Neurologic: Grossly normal  EKG: Sinus rhythm at 62  ASSESSMENT: 1. Coronary artery bypass grafting x2 vessels with LIMA to LAD and SVG to PDA in 2007 2. Asymptomatic PVCs 3. Hypertension-controlled 4. Marked dyslipidemia - intolerant to all statins and zetia 5. Diabetes 6. Gout  PLAN: 1.   Ms. Shurtz is doing fairly well, however remains at very high risk for coronary events based on her marked dyslipidemia. I think she would be a good candidate for one of the PCSK9 trials. I will gladly check a high-sensitivity CRP today  and refer her to [facility], Pharm.D[facility] which are advanced lipid clinic for evaluation and possible inclusion in a trial. Otherwise followup will be in 1 year.  Pixie Casino, MD, [facility]  Attending Cardiologist [facility] [place] 08/18/2013, 4:34 PM

## 2013-08-21 ENCOUNTER — Telehealth: Payer: Self-pay | Admitting: Pharmacist

## 2013-08-21 NOTE — Telephone Encounter (Signed)
Message copied by Bishop Limbo on Mon Aug 21, 2013  1:30 PM ------      Message from: Fidel Levy      Created: Mon Aug 21, 2013 12:35 PM      Regarding: RE: PCSK9 trial       - Crestor 10mg  (had even been cut back to [place])      - Livalo 1gram BID      - Zocor was prior to the first OV with our practice but it was noted she had myalgias and marked elevated of muscle enzymes             That's all I have in the paper chart.                   ----- Message -----         From: Bishop Limbo, [place]         Sent: 08/21/2013  12:33 PM           To: Fidel Levy, Carmen [name]      Subject: RE: PCSK9 trial                                          Thanks.  I need to know the strength of the statins that she failed.  Is there an old med list in her paper chart, or old note showing which strengths she failed. I will need these if we are able to enroll her into trial.        Thanks,  Ysidro [name]            ----- Message -----         From: Fidel Levy, Carmen [name]         Sent: 08/21/2013  12:30 PM           To: Maralyn Sago Williams Dietrick, RPH      Subject: RE: PCSK9 trial                                          Patient was notified to have lab work and she will have this prior to the appointment with you on June 9th.            Per Dr. Lysbeth Penner last office visit note "She has been intolerant to all statins and has failed Crestor, Zetia, Zocor, Lipitor and Livalo in the past"            Thanks!      ----- Message -----         From: Bishop Limbo, RPH         Sent: 08/18/2013   1:17 PM           To: Fidel Levy, Carmen [name]      Subject: RE: PCSK9 trial                                          Patient will likely qualify based on CABG 2007 and diabetes.  Needs to have an HDL < 40 at time of randomization and her's was 39 in 05/2013.  It could be close if she qualifies.  I need to know what statins she failed and doses.  It looks like failed Crestor 5 mg qd, and Zocor as well.  I don't have all your records  since a lot was likely in paper form.  Please confirm these statins were failed for me.  Also, I need to get a hs-CRP on patient done in the lab.  Once I have these results, I would be able to tell if patient would qualify for study, then I could call her and set up trial enrollment.       Thanks,      Ysidro [name]                  ----- Message -----         From: Fidel Levy, Carmen [name]         Sent: 08/18/2013  11:57 AM           To: Maralyn Sago Babetta Paterson, RPH      Subject: PCSK9 trial                                              Carmen [name]!            I am Dr. Lysbeth Penner nurse and he would like this patient to be considered for the PCSK9 trial..  I am not sure how to set this up. Could you advise me on this? Thanks a ton!            Carmen Lofts, Carmen [name], Carmen [name]                         ------

## 2013-08-21 NOTE — Telephone Encounter (Signed)
Patient pending lipid clinic appointment on 08/29/13 to discuss PCSK-9 inhibitor study.  Will get CRP soon.

## 2013-08-22 ENCOUNTER — Ambulatory Visit: Payer: Medicare Other | Attending: Family Medicine | Admitting: Physical Therapy

## 2013-08-22 DIAGNOSIS — M542 Cervicalgia: Secondary | ICD-10-CM | POA: Diagnosis not present

## 2013-08-22 DIAGNOSIS — IMO0001 Reserved for inherently not codable concepts without codable children: Secondary | ICD-10-CM | POA: Insufficient documentation

## 2013-08-22 DIAGNOSIS — M5412 Radiculopathy, cervical region: Secondary | ICD-10-CM | POA: Insufficient documentation

## 2013-08-24 ENCOUNTER — Other Ambulatory Visit: Payer: Self-pay | Admitting: Internal Medicine

## 2013-08-24 ENCOUNTER — Telehealth: Payer: Self-pay | Admitting: Internal Medicine

## 2013-08-24 ENCOUNTER — Encounter: Payer: Medicare Other | Admitting: Physical Therapy

## 2013-08-24 NOTE — Telephone Encounter (Signed)
Needs DX FOR Lab

## 2013-08-24 NOTE — Telephone Encounter (Signed)
Needs a Diagnoses

## 2013-08-24 NOTE — Telephone Encounter (Signed)
Needs DX

## 2013-08-25 ENCOUNTER — Ambulatory Visit: Payer: Medicare Other | Admitting: Physical Therapy

## 2013-08-25 DIAGNOSIS — IMO0001 Reserved for inherently not codable concepts without codable children: Secondary | ICD-10-CM | POA: Diagnosis not present

## 2013-08-25 LAB — C-REACTIVE PROTEIN: CRP: 0.7 mg/dL — AB (ref <0.60)

## 2013-08-25 LAB — HIGH SENSITIVITY CRP: CRP HIGH SENSITIVITY: 7.5 mg/L — AB

## 2013-08-28 NOTE — Telephone Encounter (Signed)
RN had provided diagnosis for CRP lab last week.

## 2013-08-29 ENCOUNTER — Ambulatory Visit (INDEPENDENT_AMBULATORY_CARE_PROVIDER_SITE_OTHER): Payer: Medicare Other | Admitting: Pharmacist

## 2013-08-29 VITALS — Wt 189.0 lb

## 2013-08-29 DIAGNOSIS — E785 Hyperlipidemia, unspecified: Secondary | ICD-10-CM

## 2013-08-29 NOTE — Assessment & Plan Note (Signed)
Patient appears to meet all criteria for SPIRE-II trial with Bococizumab.  I have passed her name and information to the [facility] who will give patient a call in the coming weeks and hopefully get her randomized.  She is aware of the very small incidence of "memory fog" people have had with these agents.  She will call me if any problems or questions with the study medication moving forward. She understands she won't need to get cholesterol checked with other offices while in the clinical trial.  She understands it is a SQ medication given q 2 weeks, and that it is placebo controlled.

## 2013-08-29 NOTE — Patient Instructions (Signed)
[facility] will call you in the next few weeks to get you enrolled into SPIRE-II clinical trial with Bococizumab

## 2013-08-29 NOTE — Progress Notes (Signed)
Patient is a 73 y.o. WF referred to lipid clinic by Dr. Debara Pickett in hopes of getting enrolled into SPIRE-II clinical trial.  Patient has failed multiple statins, including Lipitor, Zocor, Livalo, Crestor (5 mg qd, and 10 mg twice weekly), and Zetia due to muscle and joint atches.  She had a CABG in 2007, has a h/o diabetes, and a recent CRP of 7.5 mg/L which should qualify her for SPIRE-II in statin intolerant arm.  She does not have a history of cancer.  She had a recent LDL of 183 mg/dL and LDL-P number of > 3000 nmol/L in 05/2013.  She tries to eat a low fat diet, but feels that the dietary changes haven't made a huge impact on her cholesterol.  RF:  CABG, Diabetes, HTN, elevated CRP, age - LDL goal < 70, non-HDL goal < 100 Meds: Lovaza 4 g/d. Intolerant:  Lipitor, Zocor, Livalo, Crestor (5 mg qd, and 10 mg twice weekly), and Zetia   Patient is excited about the prospect of PCSK-9 inhibitors, and tells me she saw them discuss it on the news last night.  We discussed the pros and cons of these medications and about enrolling into clinical trials.  She is wanting to move forward.  Labs: 05/2013:  LDL 183, LDL-P number 3382, TG 363  08/2013:  CRP 7.5 mg/L  Current Outpatient Prescriptions  Medication Sig Dispense Refill  . allopurinol (ZYLOPRIM) 100 MG tablet Take 100 mg by mouth 2 (two) times daily.      Marland Kitchen amLODipine (NORVASC) 2.5 MG tablet TAKE 1 TABLET (2.5 MG TOTAL) BY MOUTH DAILY.  30 tablet  6  . aspirin 81 MG tablet Take 81 mg by mouth daily.      . colchicine 0.6 MG tablet Take 0.6 mg by mouth as needed.      . fluticasone (FLONASE) 50 MCG/ACT nasal spray Place 1 spray into the nose as needed.       . metoprolol tartrate (LOPRESSOR) 25 MG tablet TAKE 1 TABLET TWICE A DAY  180 tablet  5  . omega-3 acid ethyl esters (LOVAZA) 1 G capsule TAKE 2 CAPSULES BY MOUTH TWICE A DAY  120 capsule  3  . pregabalin (LYRICA) 50 MG capsule TAKE 1 CAPSULE 2 TIMES A DAY  60 capsule  11  . sitaGLIPtin (JANUVIA)  100 MG tablet Take 1 tablet (100 mg total) by mouth daily.  30 tablet  11  . triamterene-hydrochlorothiazide (DYAZIDE) 37.5-25 MG per capsule TAKE 1 CAPSULE BY MOUTH DAILY  30 capsule  11  . valsartan (DIOVAN) 160 MG tablet TAKE 1 TABLET EVERY DAY  30 tablet  11  . VOLTAREN 1 % GEL Apply 2 g topically as needed.        No current facility-administered medications for this visit.   Allergies  Allergen Reactions  . Crestor [Rosuvastatin]     Muscle aches on 5 mg daily and 10 mg twice weekly  . Lipitor [Atorvastatin]   . Livalo [Pitavastatin]   . Zetia [Ezetimibe]   . Zocor [Simvastatin]     Muscle aches   . Sulfa Antibiotics Rash   No family history on file.

## 2013-08-30 ENCOUNTER — Encounter: Payer: Self-pay | Admitting: *Deleted

## 2013-09-08 ENCOUNTER — Other Ambulatory Visit: Payer: Self-pay | Admitting: *Deleted

## 2013-09-08 DIAGNOSIS — G709 Myoneural disorder, unspecified: Secondary | ICD-10-CM

## 2013-09-08 MED ORDER — PREGABALIN 50 MG PO CAPS: ORAL_CAPSULE | ORAL | Status: DC

## 2013-09-08 NOTE — Telephone Encounter (Signed)
rx ready for pickup 

## 2013-09-08 NOTE — Telephone Encounter (Signed)
Last filled 08/09/13, last seen 06/20/13 by Jacelyn Grip, call into [facility]

## 2013-09-09 NOTE — Telephone Encounter (Signed)
Called in.

## 2013-09-21 ENCOUNTER — Telehealth: Payer: Self-pay | Admitting: *Deleted

## 2013-09-21 NOTE — Telephone Encounter (Signed)
Left message for pt to call back and schedule appt for diabetic initiative

## 2013-09-28 ENCOUNTER — Other Ambulatory Visit: Payer: Self-pay

## 2013-09-28 MED ORDER — SITAGLIPTIN PHOSPHATE 100 MG PO TABS: 100.0000 mg | ORAL_TABLET | Freq: Every day | ORAL | Status: DC

## 2013-09-28 MED ORDER — OMEGA-3-ACID ETHYL ESTERS 1 G PO CAPS: ORAL_CAPSULE | ORAL | Status: DC

## 2013-09-28 MED ORDER — ALLOPURINOL 100 MG PO TABS: 100.0000 mg | ORAL_TABLET | Freq: Two times a day (BID) | ORAL | Status: DC

## 2013-09-30 ENCOUNTER — Other Ambulatory Visit: Payer: Self-pay | Admitting: Nurse Practitioner

## 2013-09-30 MED ORDER — SITAGLIPTIN PHOSPHATE 100 MG PO TABS: 100.0000 mg | ORAL_TABLET | Freq: Every day | ORAL | Status: DC

## 2013-09-30 MED ORDER — ALLOPURINOL 100 MG PO TABS: 100.0000 mg | ORAL_TABLET | Freq: Two times a day (BID) | ORAL | Status: DC

## 2013-09-30 MED ORDER — OMEGA-3-ACID ETHYL ESTERS 1 G PO CAPS: ORAL_CAPSULE | ORAL | Status: DC

## 2013-10-16 ENCOUNTER — Telehealth: Payer: Self-pay | Admitting: Internal Medicine

## 2013-10-16 NOTE — Telephone Encounter (Signed)
New message     For Carmen [name] Is the new cholesterol medication (PCSK 9) available for patients?

## 2013-10-16 NOTE — Telephone Encounter (Signed)
Explained to patient that Medicare D isn't likely to cover and it would put her in the donut hole after just 1 or 2 prescriptions given cost.  She is in the midst of enrolling into SPIRE clinical trial with Bococizumab, so will continue to move forward with this.  She will call me back in 4-6 months to find out if there has been any status change to prescription coverage.

## 2013-10-19 ENCOUNTER — Encounter: Payer: Self-pay | Admitting: Hematology & Oncology

## 2013-10-19 ENCOUNTER — Ambulatory Visit (HOSPITAL_BASED_OUTPATIENT_CLINIC_OR_DEPARTMENT_OTHER): Payer: Medicare Other | Admitting: Hematology & Oncology

## 2013-10-19 ENCOUNTER — Other Ambulatory Visit (HOSPITAL_BASED_OUTPATIENT_CLINIC_OR_DEPARTMENT_OTHER): Payer: Medicare Other | Admitting: Lab

## 2013-10-19 LAB — CMP (CANCER CENTER ONLY)
ALBUMIN: 3.7 g/dL (ref 3.3–5.5)
ALK PHOS: 58 U/L (ref 26–84)
ALT(SGPT): 22 U/L (ref 10–47)
AST: 26 U/L (ref 11–38)
BUN: 24 mg/dL — AB (ref 7–22)
CHLORIDE: 100 meq/L (ref 98–108)
CO2: 30 mEq/L (ref 18–33)
Calcium: 10 mg/dL (ref 8.0–10.3)
Creat: 1.4 mg/dl — ABNORMAL HIGH (ref 0.6–1.2)
GLUCOSE: 164 mg/dL — AB (ref 73–118)
POTASSIUM: 4.2 meq/L (ref 3.3–4.7)
SODIUM: 141 meq/L (ref 128–145)
Total Bilirubin: 0.6 mg/dl (ref 0.20–1.60)
Total Protein: 8.1 g/dL (ref 6.4–8.1)

## 2013-10-19 LAB — CBC WITH DIFFERENTIAL (CANCER CENTER ONLY)
BASO#: 0 10*3/uL (ref 0.0–0.2)
BASO%: 0.5 % (ref 0.0–2.0)
EOS%: 3.9 % (ref 0.0–7.0)
Eosinophils Absolute: 0.3 10*3/uL (ref 0.0–0.5)
HEMATOCRIT: 41.2 % (ref 34.8–46.6)
HEMOGLOBIN: 14.2 g/dL (ref 11.6–15.9)
LYMPH#: 3.2 10*3/uL (ref 0.9–3.3)
LYMPH%: 37.5 % (ref 14.0–48.0)
MCH: 31.8 pg (ref 26.0–34.0)
MCHC: 34.5 g/dL (ref 32.0–36.0)
MCV: 92 fL (ref 81–101)
MONO#: 0.5 10*3/uL (ref 0.1–0.9)
MONO%: 5.8 % (ref 0.0–13.0)
NEUT#: 4.4 10*3/uL (ref 1.5–6.5)
NEUT%: 52.3 % (ref 39.6–80.0)
Platelets: 153 10*3/uL (ref 145–400)
RBC: 4.47 10*6/uL (ref 3.70–5.32)
RDW: 13.7 % (ref 11.1–15.7)
WBC: 8.5 10*3/uL (ref 3.9–10.0)

## 2013-10-19 LAB — IRON AND TIBC CHCC
%SAT: 27 % (ref 21–57)
Iron: 81 ug/dL (ref 41–142)
TIBC: 298 ug/dL (ref 236–444)
UIBC: 216 ug/dL (ref 120–384)

## 2013-10-19 LAB — FERRITIN CHCC: Ferritin: 77 ng/ml (ref 9–269)

## 2013-10-20 ENCOUNTER — Telehealth: Payer: Self-pay | Admitting: *Deleted

## 2013-10-20 NOTE — Telephone Encounter (Addendum)
Message copied by Lenn Sink on Fri Oct 20, 2013  8:25 AM ------      Message from: Burney Gauze R      Created: Thu Oct 19, 2013  6:20 PM       Call and let her no that her ferritin is 77. She does not need to be phlebotomized. Thanks. Pete ------Informed pt that ferritin was 77 and she doesn't need to be phlebotomized.

## 2013-10-20 NOTE — Progress Notes (Signed)
Hematology and Oncology Follow Up Visit  Timika Muench [id number] 02/27/1941 73 y.o. 10/20/2013   Principle Diagnosis:  Hemochromatosis (double heterozygote for C282Y and S65C mutations).  Current Therapy:    Phlebotomy to maintain ferritin less than 100     Interim History:  Ms.  Brahmbhatt is back for followup.3 I saw her back in April. Since then, she's been doing quite well. When we saw her back in April, her ferritin was 103. We did go ahead and phlebotomize her. As always, she and her husband will be going off on another trip. They will be going over to [place] in the summertime in September.   She's had no other health issues. She still has some pain in the left arm. This has not been too bad for her.  Her appetite has been good. She has had no nausea or vomiting.  Is no abdominal pain. She's had no change in bowel or bladder habits. She's had no cough. There's been no rashes.   Medications: Current outpatient prescriptions:allopurinol (ZYLOPRIM) 100 MG tablet, Take 1 tablet (100 mg total) by mouth 2 (two) times daily., Disp: 60 tablet, Rfl: 0;  amLODipine (NORVASC) 2.5 MG tablet, TAKE 1 TABLET (2.5 MG TOTAL) BY MOUTH DAILY., Disp: 30 tablet, Rfl: 6;  aspirin 81 MG tablet, Take 81 mg by mouth daily., Disp: , Rfl: ;  colchicine 0.6 MG tablet, Take 0.6 mg by mouth as needed., Disp: , Rfl:  fluticasone (FLONASE) 50 MCG/ACT nasal spray, Place 1 spray into the nose as needed. , Disp: , Rfl: ;  metoprolol tartrate (LOPRESSOR) 25 MG tablet, TAKE 1 TABLET TWICE A DAY, Disp: 180 tablet, Rfl: 5;  omega-3 acid ethyl esters (LOVAZA) 1 G capsule, TAKE 2 CAPSULES BY MOUTH TWICE A DAY, Disp: 120 capsule, Rfl: 2;  pregabalin (LYRICA) 50 MG capsule, TAKE 1 CAPSULE 2 TIMES A DAY, Disp: 60 capsule, Rfl: 2 sitaGLIPtin (JANUVIA) 100 MG tablet, Take 1 tablet (100 mg total) by mouth daily., Disp: 30 tablet, Rfl: 1;  triamterene-hydrochlorothiazide (DYAZIDE) 37.5-25 MG per capsule, TAKE 1 CAPSULE BY MOUTH  DAILY, Disp: 30 capsule, Rfl: 11;  valsartan (DIOVAN) 160 MG tablet, TAKE 1 TABLET EVERY DAY, Disp: 30 tablet, Rfl: 11  Allergies:  Allergies  Allergen Reactions  . Crestor [Rosuvastatin]     Muscle aches on 5 mg daily and 10 mg twice weekly  . Lipitor [Atorvastatin]   . Livalo [Pitavastatin]   . Zetia [Ezetimibe]   . Zocor [Simvastatin]     Muscle aches   . Sulfa Antibiotics Rash    Past Medical History, Surgical history, Social history, and Family History were reviewed and updated.  Review of Systems: As above  Physical Exam:  height is 5\' 3"  (1.6 m) and weight is 191 lb (86.637 kg). Her oral temperature is 97.7 F (36.5 C). Her blood pressure is 144/73 and her pulse is 56. Her respiration is 14.   Well developed and well nourished. Lungs are clear. Cardiac exam regular in rhythm. Neck without adenopathy. Ocular exam normal. Abdomen soft. No palpable liver or spleen. Back exam no tenderness. Extremities some slight chronic nonpitting edema. Joints no swelling. Skin exam no rashes. Neurological exam nonfocal.  Lab Results  Component Value Date   WBC 8.5 10/19/2013   HGB 14.2 10/19/2013   HCT 41.2 10/19/2013   MCV 92 10/19/2013   PLT 153 10/19/2013     Chemistry      Component Value Date/Time   NA 141 10/19/2013 1105   NA  141 06/20/2013 1503   NA 139 11/24/2012 1150   K 4.2 10/19/2013 1105   K 5.0 06/20/2013 1503   CL 100 10/19/2013 1105   CL 98 06/20/2013 1503   CO2 30 10/19/2013 1105   CO2 26 06/20/2013 1503   BUN 24* 10/19/2013 1105   BUN 22 06/20/2013 1503   BUN 25* 11/24/2012 1150   CREATININE 1.4* 10/19/2013 1105   CREATININE 1.21* 06/20/2013 1503      Component Value Date/Time   CALCIUM 10.0 10/19/2013 1105   CALCIUM 10.7* 06/20/2013 1503   ALKPHOS 58 10/19/2013 1105   ALKPHOS 70 06/20/2013 1503   AST 26 10/19/2013 1105   AST 28 06/20/2013 1503   ALT 22 10/19/2013 1105   ALT 27 06/20/2013 1503   BILITOT 0.60 10/19/2013 1105   BILITOT 0.4 06/20/2013 1503     Her ferritin is  77. Iron saturation is 27%.    Impression and Plan: Ms. Fontanella is 73 year old white female with hemachromatosis. She is a double heterozygote. We do not have to phlebotomize her today. We will plan to get her back here in 3 months. By then, they will have gone on a trip to [place].  Volanda Napoleon, MD 7/31/20156:52 AM

## 2013-10-28 ENCOUNTER — Other Ambulatory Visit: Payer: Self-pay | Admitting: Family Medicine

## 2013-10-30 NOTE — Telephone Encounter (Signed)
These prescriptions may be okay x1.--- Please make sure that patient has an appointment to be seen by a provider

## 2013-10-30 NOTE — Telephone Encounter (Signed)
Last seen 06/20/13  FP[facility]   Last glucose 10/19/13

## 2013-10-31 ENCOUNTER — Other Ambulatory Visit: Payer: Self-pay | Admitting: *Deleted

## 2013-10-31 ENCOUNTER — Other Ambulatory Visit: Payer: Self-pay | Admitting: Nurse Practitioner

## 2013-10-31 MED ORDER — TRIAMTERENE-HCTZ 37.5-25 MG PO CAPS: ORAL_CAPSULE | ORAL | Status: DC

## 2013-11-01 NOTE — Telephone Encounter (Signed)
no more refills without being seen  

## 2013-11-01 NOTE — Telephone Encounter (Signed)
Last ov 3/15 with Jacelyn Grip.

## 2013-11-15 ENCOUNTER — Encounter: Payer: Self-pay | Admitting: Family Medicine

## 2013-11-15 ENCOUNTER — Ambulatory Visit (INDEPENDENT_AMBULATORY_CARE_PROVIDER_SITE_OTHER): Payer: Medicare Other | Admitting: Family Medicine

## 2013-11-15 VITALS — BP 137/63 | HR 54 | Temp 98.3°F | Ht 63.0 in | Wt 189.0 lb

## 2013-11-15 DIAGNOSIS — E785 Hyperlipidemia, unspecified: Secondary | ICD-10-CM

## 2013-11-15 DIAGNOSIS — M1A00X0 Idiopathic chronic gout, unspecified site, without tophus (tophi): Secondary | ICD-10-CM

## 2013-11-15 DIAGNOSIS — E1149 Type 2 diabetes mellitus with other diabetic neurological complication: Secondary | ICD-10-CM

## 2013-11-15 DIAGNOSIS — I10 Essential (primary) hypertension: Secondary | ICD-10-CM

## 2013-11-15 LAB — POCT GLYCOSYLATED HEMOGLOBIN (HGB A1C): HEMOGLOBIN A1C: 6.3

## 2013-11-15 NOTE — Progress Notes (Signed)
   Subjective:    Patient ID: Carmen [name], female    DOB: 1940/11/20, 73 y.o.   MRN: [id number]  HPI BP 137/63  Pulse 54  Temp(Src) 98.3 F (36.8 C) (Oral)  Ht 5\' 3"  (1.6 m)  Wt 189 lb (85.73 kg)  BMI 33.1 kg/m40 73 year old female who is here to followup for diabetes, neuropathy, hypertension, and gout. We spent a fair amount of time today talking about her statin intolerance yet seeming need to keep her lipids at goal. There are some new injectable statin-like meds. She has tentatively been enrolled in a drug trial in [place] on one of these birth there are some drugs at a party been released and she seemed most interested in this as she will not be placed on a placebo potentially. She will discuss this again with her cardiologist. She has not had a gout attack in some time as long as she takes allopurinol and we will check a uric acid level today. She has not had an A1c checked in several months and that needs updating. She is status post CABG.  Review of Systems  Constitutional: Negative.   HENT: Negative.   Eyes: Negative.   Respiratory: Negative.   Cardiovascular: Negative.   Gastrointestinal: Negative.   Endocrine: Negative.   Genitourinary: Negative.   Musculoskeletal: Positive for myalgias.       Bilateral thumb pain  Skin: Rash: not true rash but healing intertrigo.  Hematological: Negative.   Psychiatric/Behavioral: Negative.        Objective:   Physical Exam  Constitutional: She is oriented to person, place, and time. She appears well-developed and well-nourished.  Eyes: Conjunctivae and EOM are normal.  Neck: Normal range of motion. Neck supple.  Cardiovascular: Normal rate, regular rhythm and normal heart sounds.   Pulmonary/Chest: Effort normal and breath sounds normal.  Abdominal: Soft. Bowel sounds are normal.  Musculoskeletal: Normal range of motion.  Neurological: She is alert and oriented to person, place, and time. She has normal reflexes.    Skin: Skin is warm and dry.  Psychiatric: She has a normal mood and affect. Her behavior is normal. Thought content normal.          Assessment & Plan:  1. Essential hypertension Continue with same meds  2. DM (diabetes mellitus) type II controlled, neurological manifestation  - POCT glycosylated hemoglobin (Hb A1C) - POCT glycosylated hemoglobin (Hb A1C)  3. HLD (hyperlipidemia) See discussion in HPI  4. Idiopathic chronic gout without tophus, unspecified site Allopuri  Wardell Honour MDnol is effective - Uric acid

## 2013-11-16 LAB — URIC ACID: URIC ACID: 5.8 mg/dL (ref 2.5–7.1)

## 2013-11-22 ENCOUNTER — Other Ambulatory Visit: Payer: Self-pay | Admitting: Family Medicine

## 2013-12-07 ENCOUNTER — Other Ambulatory Visit: Payer: Self-pay | Admitting: Family Medicine

## 2013-12-07 ENCOUNTER — Other Ambulatory Visit: Payer: Self-pay | Admitting: Nurse Practitioner

## 2013-12-08 ENCOUNTER — Ambulatory Visit (INDEPENDENT_AMBULATORY_CARE_PROVIDER_SITE_OTHER): Payer: Medicare Other | Admitting: Family

## 2013-12-08 ENCOUNTER — Encounter: Payer: Self-pay | Admitting: Family

## 2013-12-08 VITALS — BP 137/67 | HR 64 | Temp 97.4°F | Ht 63.0 in | Wt 190.0 lb

## 2013-12-08 DIAGNOSIS — E1149 Type 2 diabetes mellitus with other diabetic neurological complication: Secondary | ICD-10-CM

## 2013-12-08 DIAGNOSIS — R300 Dysuria: Secondary | ICD-10-CM

## 2013-12-08 LAB — POCT UA - MICROSCOPIC ONLY
Bacteria, U Microscopic: NEGATIVE
CASTS, UR, LPF, POC: NEGATIVE
Crystals, Ur, HPF, POC: NEGATIVE
MUCUS UA: NEGATIVE

## 2013-12-08 LAB — POCT URINALYSIS DIPSTICK
Bilirubin, UA: NEGATIVE
Glucose, UA: NEGATIVE
Ketones, UA: NEGATIVE
Nitrite, UA: NEGATIVE
Protein, UA: NEGATIVE
RBC UA: NEGATIVE
SPEC GRAV UA: 1.01
UROBILINOGEN UA: NEGATIVE
pH, UA: 6.5

## 2013-12-08 NOTE — Telephone Encounter (Signed)
Patient aware script is ready to be picked up with photo ID 

## 2013-12-08 NOTE — Telephone Encounter (Signed)
Patient aware that prescription is available to be picked up. She had a urine specimen done through a clinical trial that was pos for UTI. She was not treated and was advised to f/u with our office. Result has been scanned in. Patient denies symptoms but appt scheduled this afternoon for repeat urine and evaluation.  Patient aware.

## 2013-12-08 NOTE — Telephone Encounter (Signed)
Patient last seen in office on 11-15-13 by Sabra Heck. Rx last filled on 11-07-13 for #60.  Please advise on refill. If approved please route to Pool A so nurse can call pt to pick up. Rx will print

## 2013-12-08 NOTE — Patient Instructions (Signed)

## 2013-12-08 NOTE — Progress Notes (Signed)
   Subjective:    Patient ID: Carmen [name], female    DOB: 05/07/1940, 73 y.o.   MRN: [id number]  Urinary Tract Infection  This is a new problem. The current episode started today. The problem occurs intermittently. The problem has been unchanged. Quality: Pressure. The pain is at a severity of 0/10. The patient is experiencing no pain. There has been no fever. Associated symptoms include hesitancy, nausea and vomiting. Pertinent negatives include no chills, discharge, flank pain or frequency. She has tried increased fluids for the symptoms. The treatment provided mild relief.      Review of Systems  Constitutional: Negative.  Negative for chills.  HENT: Negative.   Eyes: Negative.   Respiratory: Negative.  Negative for shortness of breath.   Cardiovascular: Negative.  Negative for palpitations.  Gastrointestinal: Positive for nausea and vomiting.  Endocrine: Negative.   Genitourinary: Positive for hesitancy. Negative for frequency and flank pain.  Musculoskeletal: Negative.   Neurological: Negative.  Negative for headaches.  Hematological: Negative.   Psychiatric/Behavioral: Negative.   All other systems reviewed and are negative.      Objective:   Physical Exam  Vitals reviewed. Constitutional: She is oriented to person, place, and time. She appears well-developed and well-nourished. No distress.  Eyes: Pupils are equal, round, and reactive to light.  Neck: Normal range of motion. Neck supple. No thyromegaly present.  Cardiovascular: Normal rate, regular rhythm, normal heart sounds and intact distal pulses.   No murmur heard. Pulmonary/Chest: Effort normal and breath sounds normal. No respiratory distress. She has no wheezes.  Abdominal: Soft. Bowel sounds are normal. She exhibits no distension. There is no tenderness.  Musculoskeletal: Normal range of motion. She exhibits edema (2+ edema in bilateral ankles). She exhibits no tenderness.  Neg for CVA tenderness     Neurological: She is alert and oriented to person, place, and time. She has normal reflexes. No cranial nerve deficit.  Skin: Skin is warm and dry.  Psychiatric: She has a normal mood and affect. Her behavior is normal. Judgment and thought content normal.      BP 137/67  Pulse 64  Temp(Src) 97.4 F (36.3 C) (Oral)  Ht 5\' 3"  (1.6 m)  Wt 190 lb (86.183 kg)  BMI 33.67 kg/m2     Assessment & Plan:  1. Dysuria -Force fluids -Report any new s/s of infection -fever - POCT urinalysis dipstick - POCT UA - Microscopic Only  Evelina Dun, FNP

## 2013-12-30 ENCOUNTER — Other Ambulatory Visit: Payer: Self-pay | Admitting: Family Medicine

## 2014-01-02 ENCOUNTER — Encounter: Payer: Self-pay | Admitting: *Deleted

## 2014-01-03 ENCOUNTER — Other Ambulatory Visit: Payer: Self-pay | Admitting: Nurse Practitioner

## 2014-01-15 ENCOUNTER — Other Ambulatory Visit: Payer: Self-pay

## 2014-01-15 MED ORDER — AMLODIPINE BESYLATE 2.5 MG PO TABS: ORAL_TABLET | ORAL | Status: DC

## 2014-01-18 ENCOUNTER — Other Ambulatory Visit (HOSPITAL_BASED_OUTPATIENT_CLINIC_OR_DEPARTMENT_OTHER): Payer: Medicare Other | Admitting: Lab

## 2014-01-18 ENCOUNTER — Encounter: Payer: Self-pay | Admitting: Family

## 2014-01-18 ENCOUNTER — Ambulatory Visit (HOSPITAL_BASED_OUTPATIENT_CLINIC_OR_DEPARTMENT_OTHER): Payer: Medicare Other | Admitting: Family

## 2014-01-18 ENCOUNTER — Telehealth: Payer: Self-pay | Admitting: Hematology & Oncology

## 2014-01-18 LAB — CMP (CANCER CENTER ONLY)
ALT(SGPT): 34 U/L (ref 10–47)
AST: 29 U/L (ref 11–38)
Albumin: 3.6 g/dL (ref 3.3–5.5)
Alkaline Phosphatase: 68 U/L (ref 26–84)
BILIRUBIN TOTAL: 0.7 mg/dL (ref 0.20–1.60)
BUN, Bld: 21 mg/dL (ref 7–22)
CO2: 28 mEq/L (ref 18–33)
CREATININE: 1.5 mg/dL — AB (ref 0.6–1.2)
Calcium: 10 mg/dL (ref 8.0–10.3)
Chloride: 96 mEq/L — ABNORMAL LOW (ref 98–108)
GLUCOSE: 190 mg/dL — AB (ref 73–118)
Potassium: 4.1 mEq/L (ref 3.3–4.7)
Sodium: 141 mEq/L (ref 128–145)
Total Protein: 7.5 g/dL (ref 6.4–8.1)

## 2014-01-18 LAB — CBC WITH DIFFERENTIAL (CANCER CENTER ONLY)
BASO#: 0 10*3/uL (ref 0.0–0.2)
BASO%: 0.4 % (ref 0.0–2.0)
EOS%: 4.1 % (ref 0.0–7.0)
Eosinophils Absolute: 0.3 10*3/uL (ref 0.0–0.5)
HCT: 41.4 % (ref 34.8–46.6)
HEMOGLOBIN: 14.1 g/dL (ref 11.6–15.9)
LYMPH#: 3.2 10*3/uL (ref 0.9–3.3)
LYMPH%: 39.2 % (ref 14.0–48.0)
MCH: 31.1 pg (ref 26.0–34.0)
MCHC: 34.1 g/dL (ref 32.0–36.0)
MCV: 91 fL (ref 81–101)
MONO#: 0.5 10*3/uL (ref 0.1–0.9)
MONO%: 6 % (ref 0.0–13.0)
NEUT%: 50.3 % (ref 39.6–80.0)
NEUTROS ABS: 4.1 10*3/uL (ref 1.5–6.5)
Platelets: 160 10*3/uL (ref 145–400)
RBC: 4.54 10*6/uL (ref 3.70–5.32)
RDW: 13.6 % (ref 11.1–15.7)
WBC: 8.1 10*3/uL (ref 3.9–10.0)

## 2014-01-18 LAB — FERRITIN CHCC: Ferritin: 128 ng/ml (ref 9–269)

## 2014-01-18 LAB — IRON AND TIBC CHCC
%SAT: 44 % (ref 21–57)
Iron: 118 ug/dL (ref 41–142)
TIBC: 270 ug/dL (ref 236–444)
UIBC: 152 ug/dL (ref 120–384)

## 2014-01-18 NOTE — Telephone Encounter (Signed)
MAILED 03-2014 SCHEDULE

## 2014-01-18 NOTE — Progress Notes (Signed)
[place]  Telephone:([phone]) [phone] [phone]  ID: Carmen [name] OB: 09-23-1940 MR#: [id number] UXL#:[id number] Patient Care Team: Chipper Herb, MD as PCP - General (Family Medicine)  DIAGNOSIS: Hemochromatosis (double heterozygote for C282Y and S65C mutations).  INTERVAL HISTORY: Carmen [name] is here for a follow-up. She and her husband just got back from a 2 week trip to [place]. They had a wonderful time. She is feeling good and has had no issues since her last visit. Her last phlebotomy was in May. She denies fever, chills, n/v, cough, rash, headache, dizziness, SOB, chest pain, palpitations, abdominal pain, constipation, diarrhea, blood in urine or stool. No swelling, tenderness, numbness or tingling in her extremities. No bleeding or pain. Her appetite is good and she drinks lots of water. Her weight is stable.   CURRENT TREATMENT: Phlebotomy to maintain ferritin less than 100  REVIEW OF SYSTEMS: All other 10 point review of systems is negative.   PAST MEDICAL HISTORY: Past Medical History  Diagnosis Date  . Anemia   . Hypertension   . Arthritis   . Hyperlipidemia   . Diverticulosis   . Gout   . Diabetes mellitus without complication   . CAD (coronary artery disease)   . Hemochromatosis, hereditary 11/24/2012  . Neuromuscular disorder     peripheral neuropathy   PAST SURGICAL HISTORY: Past Surgical History  Procedure Laterality Date  . Lipoma excision      back  . Breast surgery      left breast lump--benign  . Cardiac catheterization  06/03/2005  . Coronary artery bypass graft  2007    x2   FAMILY HISTORY No family history on file.  GYNECOLOGIC HISTORY:  No LMP recorded. Patient is postmenopausal.   SOCIAL HISTORY: History   Social History  . Marital Status: Married    Spouse Name: N/A    Number of Children: N/A  . Years of Education: N/A   Occupational History  . Not on file.   Social History Main Topics  . Smoking status:  Never Smoker   . Smokeless tobacco: Never Used     Comment: never used tobacco  . Alcohol Use: No  . Drug Use: No  . Sexual Activity: Not on file   Other Topics Concern  . Not on file   Social History Narrative  . No narrative on file   ADVANCED DIRECTIVES:  <no information>  HEALTH MAINTENANCE: History  Substance Use Topics  . Smoking status: Never Smoker   . Smokeless tobacco: Never Used     Comment: never used tobacco  . Alcohol Use: No   Colonoscopy: PAP: Bone density: Lipid panel:  Allergies  Allergen Reactions  . Crestor [Rosuvastatin]     Muscle aches on 5 mg daily and 10 mg twice weekly  . Lipitor [Atorvastatin]   . Livalo [Pitavastatin]   . Zetia [Ezetimibe]   . Zocor [Simvastatin]     Muscle aches   . Sulfa Antibiotics Rash   Current Outpatient Prescriptions  Medication Sig Dispense Refill  . allopurinol (ZYLOPRIM) 100 MG tablet TAKE 1 TABLET (100 MG TOTAL) BY MOUTH 2 (TWO) TIMES DAILY.  60 tablet  4  . amLODipine (NORVASC) 2.5 MG tablet TAKE 1 TABLET (2.5 MG TOTAL) BY MOUTH DAILY.  30 tablet  4  . aspirin 81 MG tablet Take 81 mg by mouth daily.      . colchicine 0.6 MG tablet Take 0.6 mg by mouth as needed.      Marland Kitchen DIOVAN  160 MG tablet TAKE 1 TABLET EVERY DAY  30 tablet  1  . fluticasone (FLONASE) 50 MCG/ACT nasal spray Place 1 spray into the nose as needed.       Marland Kitchen JANUVIA 100 MG tablet TAKE 1 TABLET (100 MG TOTAL) BY MOUTH DAILY.  30 tablet  5  . LYRICA 50 MG capsule TAKE 1 CAPSULE BY MOUTH TWO TIMES A DAY  60 capsule  3  . metoprolol tartrate (LOPRESSOR) 25 MG tablet TAKE 1 TABLET TWICE A DAY  180 tablet  5  . omega-3 acid ethyl esters (LOVAZA) 1 G capsule TAKE 2 CAPSULES BY MOUTH TWICE A DAY  120 capsule  2  . sitaGLIPtin (JANUVIA) 100 MG tablet Take 1 tablet (100 mg total) by mouth daily.  30 tablet  1  . triamterene-hydrochlorothiazide (DYAZIDE) 37.5-25 MG per capsule TAKE 1 CAPSULE BY MOUTH DAILY  30 capsule  3   No current facility-administered  medications for this visit.   OBJECTIVE: Filed Vitals:   01/18/14 1118  BP: 141/56  Pulse: 61  Temp: 98.2 F (36.8 C)  Resp: 14    Filed Weights   01/18/14 1118  Weight: 192 lb (87.091 kg)   ECOG FS:0 - Asymptomatic Ocular: Sclerae unicteric, pupils equal, round and reactive to light Ear-nose-throat: Oropharynx clear, dentition fair Lymphatic: No cervical or supraclavicular adenopathy Lungs no rales or rhonchi, good excursion bilaterally Heart regular rate and rhythm, no murmur appreciated Abd soft, nontender, positive bowel sounds MSK no focal spinal tenderness, no joint edema Neuro: non-focal, well-oriented, appropriate affect Breasts: Deferred  LAB RESULTS: CMP     Component Value Date/Time   NA 141 01/18/2014 1104   NA 141 06/20/2013 1503   NA 139 11/24/2012 1150   K 4.1 01/18/2014 1104   K 5.0 06/20/2013 1503   CL 96* 01/18/2014 1104   CL 98 06/20/2013 1503   CO2 28 01/18/2014 1104   CO2 26 06/20/2013 1503   GLUCOSE 190* 01/18/2014 1104   GLUCOSE 97 06/20/2013 1503   GLUCOSE 124* 11/24/2012 1150   BUN 21 01/18/2014 1104   BUN 22 06/20/2013 1503   BUN 25* 11/24/2012 1150   CREATININE 1.5* 01/18/2014 1104   CREATININE 1.21* 06/20/2013 1503   CALCIUM 10.0 01/18/2014 1104   CALCIUM 10.7* 06/20/2013 1503   PROT 7.5 01/18/2014 1104   PROT 7.8 06/20/2013 1503   PROT 7.6 11/24/2012 1150   ALBUMIN 4.7 11/24/2012 1150   AST 29 01/18/2014 1104   AST 28 06/20/2013 1503   ALT 34 01/18/2014 1104   ALT 27 06/20/2013 1503   ALKPHOS 68 01/18/2014 1104   ALKPHOS 70 06/20/2013 1503   BILITOT 0.70 01/18/2014 1104   BILITOT 0.4 06/20/2013 1503   GFRNONAA 44* 06/20/2013 1503   GFRAA 51* 06/20/2013 1503   No results found for this basename: SPEP, UPEP,  kappa and lambda light chains   Lab Results  Component Value Date   WBC 8.1 01/18/2014   NEUTROABS 4.1 01/18/2014   HGB 14.1 01/18/2014   HCT 41.4 01/18/2014   MCV 91 01/18/2014   PLT 160 01/18/2014   No results found for this basename:  LABCA2   No components found with this basename: GYJEH631   No results found for this basename: INR,  in the last 168 hours  STUDIES: No results found.  ASSESSMENT/PLAN: Carmen [name] is 73 year old white female with hemachromatosis. She is a double heterozygote. She is asymptomatic at this time.  Her CBC was ok. We will wait  and see what her iron studies show and decide then if she needs a phlebotomy.  We will see her back in 3 months for labs and follow-up.  She knows to call here with any questions and to go to the ED in the event of an emergency. We can certainly see her sooner if need be.   Eliezer Bottom, NP 01/18/2014 1:18 PM

## 2014-01-19 ENCOUNTER — Telehealth: Payer: Self-pay | Admitting: *Deleted

## 2014-01-19 NOTE — Telephone Encounter (Addendum)
Notified patient and message sent to [facility] for scheduling.   Message copied by Cordelia Poche on Fri Jan 19, 2014  8:48 AM ------      Message from: Volanda Napoleon      Created: Fri Jan 19, 2014  7:51 AM       Please call and let her know that her iron is too high. The ferritin is 128. She is to come in for a phlebotomy. Please set this up for next week. Please make sure that she has a follow-up appointment in 6 weeks to see me. Things. pete ------

## 2014-01-23 ENCOUNTER — Ambulatory Visit (HOSPITAL_BASED_OUTPATIENT_CLINIC_OR_DEPARTMENT_OTHER): Payer: Medicare Other

## 2014-01-23 NOTE — Patient Instructions (Signed)

## 2014-01-23 NOTE — Progress Notes (Signed)
Carmen [name] presents today for phlebotomy per MD orders. Phlebotomy procedure started at 1113 and ended at 1150. Approximately 547mls removed. Patient observed for 30 minutes after procedure without any incident. Patient tolerated procedure well. IV needle removed intact.

## 2014-01-29 NOTE — Addendum Note (Signed)
Addended by: Pollyann Kennedy F on: 01/29/2014 01:30 PM   Modules accepted: Orders

## 2014-02-01 ENCOUNTER — Other Ambulatory Visit: Payer: Medicare Other

## 2014-02-01 DIAGNOSIS — E1149 Type 2 diabetes mellitus with other diabetic neurological complication: Secondary | ICD-10-CM

## 2014-02-02 LAB — MICROALBUMIN, URINE: Microalbumin, Urine: 7.4 ug/mL (ref 0.0–17.0)

## 2014-02-05 ENCOUNTER — Telehealth: Payer: Self-pay | Admitting: Family Medicine

## 2014-02-05 NOTE — Telephone Encounter (Signed)
Pt aware of lab result.  rs

## 2014-02-05 NOTE — Telephone Encounter (Signed)
-----   Message from Chipper Herb, MD sent at 02/02/2014  7:44 PM EST ----- The urine microalbumin is within normal limits

## 2014-02-09 ENCOUNTER — Telehealth: Payer: Self-pay

## 2014-02-09 NOTE — Telephone Encounter (Signed)
Letter sent to patient with results.

## 2014-02-09 NOTE — Telephone Encounter (Signed)
-----   Message from Chipper Herb, MD sent at 02/02/2014  7:44 PM EST ----- The urine microalbumin is within normal limits

## 2014-02-27 LAB — HM MAMMOGRAPHY

## 2014-03-01 ENCOUNTER — Other Ambulatory Visit: Payer: Self-pay | Admitting: Nurse Practitioner

## 2014-03-02 ENCOUNTER — Ambulatory Visit (HOSPITAL_BASED_OUTPATIENT_CLINIC_OR_DEPARTMENT_OTHER): Payer: Medicare Other | Admitting: Hematology & Oncology

## 2014-03-02 ENCOUNTER — Telehealth: Payer: Self-pay | Admitting: Hematology & Oncology

## 2014-03-02 ENCOUNTER — Other Ambulatory Visit (HOSPITAL_BASED_OUTPATIENT_CLINIC_OR_DEPARTMENT_OTHER): Payer: Medicare Other | Admitting: Lab

## 2014-03-02 LAB — FERRITIN CHCC: Ferritin: 80 ng/ml (ref 9–269)

## 2014-03-02 LAB — CBC WITH DIFFERENTIAL (CANCER CENTER ONLY)
BASO#: 0.1 10*3/uL (ref 0.0–0.2)
BASO%: 0.6 % (ref 0.0–2.0)
EOS%: 3.5 % (ref 0.0–7.0)
Eosinophils Absolute: 0.3 10*3/uL (ref 0.0–0.5)
HEMATOCRIT: 40.6 % (ref 34.8–46.6)
HEMOGLOBIN: 13.6 g/dL (ref 11.6–15.9)
LYMPH#: 3.7 10*3/uL — AB (ref 0.9–3.3)
LYMPH%: 41.3 % (ref 14.0–48.0)
MCH: 31.2 pg (ref 26.0–34.0)
MCHC: 33.5 g/dL (ref 32.0–36.0)
MCV: 93 fL (ref 81–101)
MONO#: 0.6 10*3/uL (ref 0.1–0.9)
MONO%: 6.1 % (ref 0.0–13.0)
NEUT#: 4.3 10*3/uL (ref 1.5–6.5)
NEUT%: 48.5 % (ref 39.6–80.0)
Platelets: 168 10*3/uL (ref 145–400)
RBC: 4.36 10*6/uL (ref 3.70–5.32)
RDW: 13.7 % (ref 11.1–15.7)
WBC: 9 10*3/uL (ref 3.9–10.0)

## 2014-03-02 LAB — COMPREHENSIVE METABOLIC PANEL
ALT: 31 U/L (ref 0–35)
AST: 29 U/L (ref 0–37)
Albumin: 4.4 g/dL (ref 3.5–5.2)
Alkaline Phosphatase: 68 U/L (ref 39–117)
BUN: 22 mg/dL (ref 6–23)
CO2: 27 meq/L (ref 19–32)
CREATININE: 1.55 mg/dL — AB (ref 0.50–1.10)
Calcium: 9.6 mg/dL (ref 8.4–10.5)
Chloride: 101 mEq/L (ref 96–112)
Glucose, Bld: 159 mg/dL — ABNORMAL HIGH (ref 70–99)
Potassium: 4.3 mEq/L (ref 3.5–5.3)
Sodium: 140 mEq/L (ref 135–145)
Total Bilirubin: 0.4 mg/dL (ref 0.2–1.2)
Total Protein: 7 g/dL (ref 6.0–8.3)

## 2014-03-02 LAB — IRON AND TIBC CHCC
%SAT: 24 % (ref 21–57)
IRON: 69 ug/dL (ref 41–142)
TIBC: 289 ug/dL (ref 236–444)
UIBC: 219 ug/dL (ref 120–384)

## 2014-03-02 NOTE — Telephone Encounter (Signed)
Mailed 05-2014 schedule per RN pt wanted after 06-06-14

## 2014-03-02 NOTE — Progress Notes (Signed)
Hematology and Oncology Follow Up Visit  Carmen [name] [id number] 05-04-1940 73 y.o. 03/02/2014   Principle Diagnosis:  Hemochromatosis (double heterozygote for C282Y and S65C mutations).  Current Therapy:    Phlebotomy to maintain ferritin less than 100     Interim History:  Carmen [name] is back for followup.she is doing okay. She was last phlebotomized in November. We last saw her in October, her ferritin was 129.  She and her husband had a nice Thanksgiving. They are looking forward to Christmas.  She's had no problems with blood sugars. She monitors this at home.  She's had no bleeding. There's been no change in bowel or bladder habits. She's had no cough. She's had no rashes. She's had no leg swelling. She's had no headache. Is no abdominal pain.   Medications: Current outpatient prescriptions: allopurinol (ZYLOPRIM) 100 MG tablet, TAKE 1 TABLET (100 MG TOTAL) BY MOUTH 2 (TWO) TIMES DAILY., Disp: 60 tablet, Rfl: 4;  amLODipine (NORVASC) 2.5 MG tablet, TAKE 1 TABLET (2.5 MG TOTAL) BY MOUTH DAILY., Disp: 30 tablet, Rfl: 4;  aspirin 81 MG tablet, Take 81 mg by mouth daily., Disp: , Rfl: ;  colchicine 0.6 MG tablet, Take 0.6 mg by mouth as needed., Disp: , Rfl:  DIOVAN 160 MG tablet, TAKE 1 TABLET EVERY DAY, Disp: 30 tablet, Rfl: 2;  fluticasone (FLONASE) 50 MCG/ACT nasal spray, Place 1 spray into the nose as needed. , Disp: , Rfl: ;  JANUVIA 100 MG tablet, TAKE 1 TABLET (100 MG TOTAL) BY MOUTH DAILY., Disp: 30 tablet, Rfl: 5;  LYRICA 50 MG capsule, TAKE 1 CAPSULE BY MOUTH TWO TIMES A DAY, Disp: 60 capsule, Rfl: 3 metoprolol tartrate (LOPRESSOR) 25 MG tablet, TAKE 1 TABLET TWICE A DAY, Disp: 180 tablet, Rfl: 5;  omega-3 acid ethyl esters (LOVAZA) 1 G capsule, TAKE 2 CAPSULES BY MOUTH TWICE A DAY, Disp: 120 capsule, Rfl: 2;  sitaGLIPtin (JANUVIA) 100 MG tablet, Take 1 tablet (100 mg total) by mouth daily., Disp: 30 tablet, Rfl: 1;  triamterene-hydrochlorothiazide (DYAZIDE) 37.5-25 MG per  capsule, TAKE 1 CAPSULE BY MOUTH DAILY, Disp: 30 capsule, Rfl: 3  Allergies:  Allergies  Allergen Reactions  . Crestor [Rosuvastatin]     Muscle aches on 5 mg daily and 10 mg twice weekly  . Lipitor [Atorvastatin]   . Livalo [Pitavastatin]   . Zetia [Ezetimibe]   . Zocor [Simvastatin]     Muscle aches   . Sulfa Antibiotics Rash    Past Medical History, Surgical history, Social history, and Family History were reviewed and updated.  Review of Systems: As above  Physical Exam:  temperature is 97.8 F (36.6 C). Her blood pressure is 150/60 and her pulse is 55. Her respiration is 18.   Well developed and well nourished. Lungs are clear. Cardiac exam regular rate and rhythm with no murmurs, rubs or bruits.. Neck without adenopathy. Her thyroid is nonpalpable. Ocular exam normal. Abdomen is soft. There is no palpable liver or spleen. She has good bowel sounds. Back exam shows no tenderness over the spine, ribs or hips.. Extremities some slight chronic nonpitting edema. She has good strength in her extremity is. She has good range of motion of her joints. Skin exam no rashes, ecchymoses or petechia. Neurological exam nonfocal.  Lab Results  Component Value Date   WBC 9.0 03/02/2014   HGB 13.6 03/02/2014   HCT 40.6 03/02/2014   MCV 93 03/02/2014   PLT 168 03/02/2014     Chemistry  Component Value Date/Time   NA 141 01/18/2014 1104   NA 141 06/20/2013 1503   NA 139 11/24/2012 1150   K 4.1 01/18/2014 1104   K 5.0 06/20/2013 1503   CL 96* 01/18/2014 1104   CL 98 06/20/2013 1503   CO2 28 01/18/2014 1104   CO2 26 06/20/2013 1503   BUN 21 01/18/2014 1104   BUN 22 06/20/2013 1503   BUN 25* 11/24/2012 1150   CREATININE 1.5* 01/18/2014 1104   CREATININE 1.21* 06/20/2013 1503      Component Value Date/Time   CALCIUM 10.0 01/18/2014 1104   CALCIUM 10.7* 06/20/2013 1503   ALKPHOS 68 01/18/2014 1104   ALKPHOS 70 06/20/2013 1503   AST 29 01/18/2014 1104   AST 28 06/20/2013  1503   ALT 34 01/18/2014 1104   ALT 27 06/20/2013 1503   BILITOT 0.70 01/18/2014 1104   BILITOT 0.4 06/20/2013 1503     Ferritin is 80. Iron saturation is 24%.   Impression and Plan: Carmen [name] is 73 year old white female with hemachromatosis. She is a double heterozygote. We do not have to phlebotomize her today. We will plan to get her back here in 3 months.  Volanda Napoleon, MD 12/11/20152:39 PM

## 2014-03-19 ENCOUNTER — Other Ambulatory Visit: Payer: Self-pay | Admitting: Nurse Practitioner

## 2014-03-27 ENCOUNTER — Other Ambulatory Visit: Payer: Self-pay

## 2014-03-27 MED ORDER — GLUCOSE BLOOD VI STRP
ORAL_STRIP | Status: DC
Start: 2014-03-27 — End: 2015-03-21

## 2014-04-03 ENCOUNTER — Telehealth: Payer: Self-pay | Admitting: Family Medicine

## 2014-04-10 ENCOUNTER — Other Ambulatory Visit: Payer: Self-pay | Admitting: *Deleted

## 2014-04-10 MED ORDER — METOPROLOL TARTRATE 25 MG PO TABS: ORAL_TABLET | ORAL | Status: DC

## 2014-04-10 NOTE — Telephone Encounter (Signed)
Metoprolol refilled for one month. Patient needs to be seen for additional refills.

## 2014-04-11 ENCOUNTER — Other Ambulatory Visit: Payer: Self-pay | Admitting: Family

## 2014-04-11 NOTE — Telephone Encounter (Signed)
Last seen 12/08/13 [facility]

## 2014-04-14 NOTE — Telephone Encounter (Signed)
Called to pharmacy 

## 2014-04-15 ENCOUNTER — Other Ambulatory Visit: Payer: Self-pay | Admitting: Nurse Practitioner

## 2014-04-19 ENCOUNTER — Ambulatory Visit: Payer: Medicare Other | Admitting: Hematology & Oncology

## 2014-04-19 ENCOUNTER — Other Ambulatory Visit: Payer: Medicare Other | Admitting: Lab

## 2014-04-30 ENCOUNTER — Telehealth: Payer: Self-pay | Admitting: Family Medicine

## 2014-05-01 ENCOUNTER — Other Ambulatory Visit: Payer: Self-pay | Admitting: Family

## 2014-05-08 ENCOUNTER — Other Ambulatory Visit: Payer: Self-pay | Admitting: *Deleted

## 2014-05-08 DIAGNOSIS — H40033 Anatomical narrow angle, bilateral: Secondary | ICD-10-CM | POA: Diagnosis not present

## 2014-05-08 DIAGNOSIS — E119 Type 2 diabetes mellitus without complications: Secondary | ICD-10-CM | POA: Diagnosis not present

## 2014-05-08 LAB — HM DIABETES EYE EXAM

## 2014-05-08 MED ORDER — METOPROLOL TARTRATE 25 MG PO TABS: 25.0000 mg | ORAL_TABLET | Freq: Two times a day (BID) | ORAL | Status: DC

## 2014-05-15 ENCOUNTER — Other Ambulatory Visit: Payer: Self-pay

## 2014-05-15 MED ORDER — OMEGA-3-ACID ETHYL ESTERS 1 G PO CAPS: 2.0000 | ORAL_CAPSULE | Freq: Two times a day (BID) | ORAL | Status: DC

## 2014-05-15 NOTE — Telephone Encounter (Signed)
Last seen 12/08/13 Carmen [name]  Last lipid 06/20/13

## 2014-05-22 ENCOUNTER — Encounter: Payer: Self-pay | Admitting: Family Medicine

## 2014-05-22 ENCOUNTER — Ambulatory Visit (INDEPENDENT_AMBULATORY_CARE_PROVIDER_SITE_OTHER): Payer: Medicare Other | Admitting: Family Medicine

## 2014-05-22 VITALS — BP 135/80 | HR 77 | Temp 97.0°F | Ht 63.0 in | Wt 191.0 lb

## 2014-05-22 DIAGNOSIS — M1990 Unspecified osteoarthritis, unspecified site: Secondary | ICD-10-CM

## 2014-05-22 DIAGNOSIS — I10 Essential (primary) hypertension: Secondary | ICD-10-CM | POA: Diagnosis not present

## 2014-05-22 DIAGNOSIS — E1149 Type 2 diabetes mellitus with other diabetic neurological complication: Secondary | ICD-10-CM

## 2014-05-22 DIAGNOSIS — M129 Arthropathy, unspecified: Secondary | ICD-10-CM | POA: Diagnosis not present

## 2014-05-22 DIAGNOSIS — E785 Hyperlipidemia, unspecified: Secondary | ICD-10-CM | POA: Diagnosis not present

## 2014-05-22 LAB — POCT GLYCOSYLATED HEMOGLOBIN (HGB A1C): HEMOGLOBIN A1C: 6.6

## 2014-05-22 MED ORDER — ALLOPURINOL 100 MG PO TABS: 100.0000 mg | ORAL_TABLET | Freq: Two times a day (BID) | ORAL | Status: DC

## 2014-05-22 MED ORDER — VALSARTAN 160 MG PO TABS: 160.0000 mg | ORAL_TABLET | Freq: Every day | ORAL | Status: DC

## 2014-05-22 MED ORDER — ALIROCUMAB 75 MG/ML ~~LOC~~ SOPN: 75.0000 mg | PEN_INJECTOR | SUBCUTANEOUS | Status: DC

## 2014-05-22 MED ORDER — METOPROLOL TARTRATE 25 MG PO TABS: 25.0000 mg | ORAL_TABLET | Freq: Two times a day (BID) | ORAL | Status: DC

## 2014-05-22 MED ORDER — AMLODIPINE BESYLATE 2.5 MG PO TABS: ORAL_TABLET | ORAL | Status: DC

## 2014-05-22 MED ORDER — SITAGLIPTIN PHOSPHATE 100 MG PO TABS: 100.0000 mg | ORAL_TABLET | Freq: Every day | ORAL | Status: DC

## 2014-05-22 MED ORDER — TRIAMTERENE-HCTZ 37.5-25 MG PO CAPS: 1.0000 | ORAL_CAPSULE | Freq: Every day | ORAL | Status: DC

## 2014-05-22 MED ORDER — PREGABALIN 50 MG PO CAPS
50.0000 mg | ORAL_CAPSULE | Freq: Two times a day (BID) | ORAL | Status: DC
Start: 2014-05-22 — End: 2014-09-14

## 2014-05-22 NOTE — Addendum Note (Signed)
Addended by: Evett Plater on: 05/22/2014 04:11 PM   Modules accepted: Orders

## 2014-05-22 NOTE — Patient Instructions (Signed)
Use Debrox (wax softening drops) to soften wax in right ear

## 2014-05-22 NOTE — Progress Notes (Signed)
Subjective:    Patient ID: Carmen [name], female    DOB: [birth year], 74 y.o.   MRN: [id number]  HPI 73 year old female who is here to follow-up her diabetes, coronary artery disease, and arthritis. Again, this visit we talked about her intolerance to statins. Given her lipid abnormalities she is a good candidate for some of the newer drugs injectable drugs to lower her LDL and particle numbers and we have made arrangements to begin one of those drugs now.  Regarding her diabetes, she is able to afford the Januvia. Her home glucose monitoring has been good. Lyrica is effective as far as peripheral neuropathy symptoms.  She has not had any recent chest pain. She does take metoprolol.  She complains today of pain in her fingers but has never been treated for arthritis due to her heart and kidney disease.  Patient Active Problem List   Diagnosis Date Noted  . Statin intolerance 08/18/2013  . Neuromuscular disorder   . Hemochromatosis, hereditary 11/24/2012  . Pain 10/20/2012  . S/P CABG x 2 08/09/2012  . PVC's (premature ventricular contractions) 08/09/2012  . Gout 06/20/2012  . HTN (hypertension) 06/20/2012  . HLD (hyperlipidemia) 06/20/2012  . CAD (coronary artery disease) 06/20/2012  . DM (diabetes mellitus) type II controlled, neurological manifestation 06/20/2012  . Arthritis 06/20/2012   Outpatient Encounter Prescriptions as of 05/22/2014  Medication Sig  . allopurinol (ZYLOPRIM) 100 MG tablet TAKE 1 TABLET (100 MG TOTAL) BY MOUTH 2 (TWO) TIMES DAILY.  Marland Kitchen amLODipine (NORVASC) 2.5 MG tablet TAKE 1 TABLET (2.5 MG TOTAL) BY MOUTH DAILY.  Marland Kitchen aspirin 81 MG tablet Take 81 mg by mouth daily.  . colchicine 0.6 MG tablet Take 0.6 mg by mouth as needed.  Marland Kitchen DIOVAN 160 MG tablet TAKE 1 TABLET EVERY DAY  . fluticasone (FLONASE) 50 MCG/ACT nasal spray Place 1 spray into the nose as needed.   Marland Kitchen glucose blood test strip Use as instructed  . JANUVIA 100 MG tablet TAKE 1 TABLET (100 MG TOTAL) BY  MOUTH DAILY.  Marland Kitchen LYRICA 50 MG capsule TAKE ONE CAPSULE BY MOUTH TWICE A DAY  . metoprolol tartrate (LOPRESSOR) 25 MG tablet Take 1 tablet (25 mg total) by mouth 2 (two) times daily. TAKE 1 TABLET TWICE A DAY  . omega-3 acid ethyl esters (LOVAZA) 1 G capsule Take 2 capsules (2 g total) by mouth 2 (two) times daily.  Marland Kitchen triamterene-hydrochlorothiazide (DYAZIDE) 37.5-25 MG per capsule TAKE 1 CAPSULE BY MOUTH DAILY  . [DISCONTINUED] sitaGLIPtin (JANUVIA) 100 MG tablet Take 1 tablet (100 mg total) by mouth daily.  . [DISCONTINUED] triamterene-hydrochlorothiazide (MAXZIDE-25) 37.5-25 MG per tablet Take 1 tablet by mouth daily.     Review of Systems  Constitutional: Negative.   HENT: Negative.   Respiratory: Negative.   Cardiovascular: Negative.   Gastrointestinal: Negative.   Genitourinary: Negative.   Musculoskeletal: Positive for arthralgias.  Neurological: Negative.   Psychiatric/Behavioral: Negative.        Objective:   Physical Exam  Constitutional: She is oriented to person, place, and time. She appears well-developed.  Cardiovascular: Normal rate and regular rhythm.   Murmur heard. Pulmonary/Chest: Effort normal and breath sounds normal.  Abdominal: Soft. Bowel sounds are normal.  Musculoskeletal:  Fingers are swollen especially PIP joints with thickening of the synovium consistent with arthritis  Neurological: She is alert and oriented to person, place, and time.     BP 135/80 mmHg  Pulse 77  Temp(Src) 97 F (36.1 C) (Oral)  Ht 5\' 3"  (  1.6 m)  Wt 191 lb (86.637 kg)  BMI 33.84 kg/m2      Assessment & Plan:  1. Essential hypertension Blood pressures are well controlledwith metoprolol, Diovan, and amlodipine. I would not recommend any changes at this time 2. HLD (hyperlipidemia) Begin Praluent - NMR, lipoprofile  3. DM (diabetes mellitus) type II controlled, neurological manifestation  - POCT glycosylated hemoglobin (Hb A1C)  4. Arthritis May be candidate for  biologic agent - Vit D  25 hydroxy (rtn osteoporosis monitoring) - Ambulatory referral to Rheumatology  Wardell Honour MD

## 2014-05-22 NOTE — Addendum Note (Signed)
Addended by: Kynzi Plater on: 05/22/2014 04:09 PM   Modules accepted: Orders

## 2014-05-23 LAB — LIPID PANEL
Chol/HDL Ratio: 7.3 ratio units — ABNORMAL HIGH (ref 0.0–4.4)
Cholesterol, Total: 279 mg/dL — ABNORMAL HIGH (ref 100–199)
HDL: 38 mg/dL — ABNORMAL LOW (ref >39)
LDL Calculated: 180 mg/dL — ABNORMAL HIGH (ref 0–99)
TRIGLYCERIDES: 306 mg/dL — AB (ref 0–149)
VLDL CHOLESTEROL CAL: 61 mg/dL — AB (ref 5–40)

## 2014-05-23 LAB — VITAMIN D 25 HYDROXY (VIT D DEFICIENCY, FRACTURES): Vit D, 25-Hydroxy: 20.5 ng/mL — ABNORMAL LOW (ref 30.0–100.0)

## 2014-05-24 ENCOUNTER — Telehealth: Payer: Self-pay | Admitting: Family Medicine

## 2014-05-24 ENCOUNTER — Other Ambulatory Visit: Payer: Self-pay | Admitting: Family Medicine

## 2014-05-29 ENCOUNTER — Telehealth: Payer: Self-pay | Admitting: Family Medicine

## 2014-05-29 NOTE — Telephone Encounter (Signed)
Patient is wanting to know what you have decided to do in regards to her cholesterol did you want her to start the cholesterol shot?

## 2014-06-01 ENCOUNTER — Ambulatory Visit: Payer: Medicare Other | Admitting: Hematology & Oncology

## 2014-06-01 ENCOUNTER — Other Ambulatory Visit: Payer: Medicare Other | Admitting: Lab

## 2014-06-13 ENCOUNTER — Telehealth: Payer: Self-pay | Admitting: Hematology & Oncology

## 2014-06-13 NOTE — Telephone Encounter (Signed)
Pt moved 3-25 to 4-20

## 2014-06-15 ENCOUNTER — Other Ambulatory Visit: Payer: Medicare Other | Admitting: Lab

## 2014-06-15 ENCOUNTER — Ambulatory Visit: Payer: Medicare Other | Admitting: Hematology & Oncology

## 2014-07-02 DIAGNOSIS — M79643 Pain in unspecified hand: Secondary | ICD-10-CM | POA: Diagnosis not present

## 2014-07-02 DIAGNOSIS — R5383 Other fatigue: Secondary | ICD-10-CM | POA: Diagnosis not present

## 2014-07-02 DIAGNOSIS — M2550 Pain in unspecified joint: Secondary | ICD-10-CM | POA: Diagnosis not present

## 2014-07-11 ENCOUNTER — Encounter: Payer: Self-pay | Admitting: Hematology & Oncology

## 2014-07-11 ENCOUNTER — Other Ambulatory Visit (HOSPITAL_BASED_OUTPATIENT_CLINIC_OR_DEPARTMENT_OTHER): Payer: Medicare Other

## 2014-07-11 ENCOUNTER — Ambulatory Visit (HOSPITAL_BASED_OUTPATIENT_CLINIC_OR_DEPARTMENT_OTHER): Payer: Medicare Other | Admitting: Hematology & Oncology

## 2014-07-11 DIAGNOSIS — E119 Type 2 diabetes mellitus without complications: Secondary | ICD-10-CM

## 2014-07-11 LAB — CBC WITH DIFFERENTIAL (CANCER CENTER ONLY)
BASO#: 0 10*3/uL (ref 0.0–0.2)
BASO%: 0.3 % (ref 0.0–2.0)
EOS%: 4.5 % (ref 0.0–7.0)
Eosinophils Absolute: 0.4 10*3/uL (ref 0.0–0.5)
HCT: 41.8 % (ref 34.8–46.6)
HGB: 14.1 g/dL (ref 11.6–15.9)
LYMPH#: 3.3 10*3/uL (ref 0.9–3.3)
LYMPH%: 35.7 % (ref 14.0–48.0)
MCH: 30.9 pg (ref 26.0–34.0)
MCHC: 33.7 g/dL (ref 32.0–36.0)
MCV: 92 fL (ref 81–101)
MONO#: 0.6 10*3/uL (ref 0.1–0.9)
MONO%: 6.7 % (ref 0.0–13.0)
NEUT#: 4.9 10*3/uL (ref 1.5–6.5)
NEUT%: 52.8 % (ref 39.6–80.0)
PLATELETS: 164 10*3/uL (ref 145–400)
RBC: 4.57 10*6/uL (ref 3.70–5.32)
RDW: 13.8 % (ref 11.1–15.7)
WBC: 9.3 10*3/uL (ref 3.9–10.0)

## 2014-07-11 LAB — FERRITIN CHCC: FERRITIN: 138 ng/mL (ref 9–269)

## 2014-07-11 LAB — COMPREHENSIVE METABOLIC PANEL
ALBUMIN: 4.2 g/dL (ref 3.5–5.2)
ALT: 35 U/L (ref 0–35)
AST: 31 U/L (ref 0–37)
Alkaline Phosphatase: 64 U/L (ref 39–117)
BUN: 24 mg/dL — AB (ref 6–23)
CO2: 24 mEq/L (ref 19–32)
Calcium: 9.5 mg/dL (ref 8.4–10.5)
Chloride: 102 mEq/L (ref 96–112)
Creatinine, Ser: 1.28 mg/dL — ABNORMAL HIGH (ref 0.50–1.10)
Glucose, Bld: 184 mg/dL — ABNORMAL HIGH (ref 70–99)
POTASSIUM: 3.9 meq/L (ref 3.5–5.3)
Sodium: 138 mEq/L (ref 135–145)
Total Bilirubin: 0.5 mg/dL (ref 0.2–1.2)
Total Protein: 7.6 g/dL (ref 6.0–8.3)

## 2014-07-11 LAB — IRON AND TIBC CHCC
%SAT: 43 % (ref 21–57)
Iron: 118 ug/dL (ref 41–142)
TIBC: 271 ug/dL (ref 236–444)
UIBC: 154 ug/dL (ref 120–384)

## 2014-07-11 NOTE — Progress Notes (Signed)
Hematology and Oncology Follow Up Visit  Carmen [name] [id number] 08-25-1940 74 y.o. 07/11/2014   Principle Diagnosis:  Hemochromatosis (double heterozygote for C282Y and S65C mutations).  Current Therapy:    Phlebotomy to maintain ferritin less than 100     Interim History:  Ms.  Bugge is back for followup.she is doing okay. She was last phlebotomized in November. We last saw her in December., Her ferritin was ferritin was 80.  She is doing well. She got to the Christmas time and through the winter without any problems.  She does have some renal issues. She has had some arthritic issues. I told her that this was not from hemochromatosis. The hands look quite arthritic from osteoarthritis. She does see a rheumatologist.  I told her that her diabetes will be much more a problem then hemochromatosis for her. We are controlling her iron levels quite well. They just have not been a problem for her.  I noted that her vitamin D was low side back in March. I would assume that this is been evaluated and corrected.  Overall, her performance status is ECOG 1.  Medications:  Current outpatient prescriptions:  .  allopurinol (ZYLOPRIM) 100 MG tablet, Take 1 tablet (100 mg total) by mouth 2 (two) times daily., Disp: 60 tablet, Rfl: 4 .  amLODipine (NORVASC) 2.5 MG tablet, TAKE 1 TABLET (2.5 MG TOTAL) BY MOUTH DAILY., Disp: 30 tablet, Rfl: 4 .  aspirin 81 MG tablet, Take 81 mg by mouth daily., Disp: , Rfl:  .  colchicine 0.6 MG tablet, Take 0.6 mg by mouth as needed., Disp: , Rfl:  .  DIOVAN 160 MG tablet, TAKE 1 TABLET EVERY DAY, Disp: 30 tablet, Rfl: 5 .  fluticasone (FLONASE) 50 MCG/ACT nasal spray, Place 1 spray into the nose as needed. , Disp: , Rfl:  .  glucose blood test strip, Use as instructed, Disp: 100 each, Rfl: 0 .  metoprolol tartrate (LOPRESSOR) 25 MG tablet, Take 1 tablet (25 mg total) by mouth 2 (two) times daily., Disp: 60 tablet, Rfl: 4 .  omega-3 acid ethyl esters  (LOVAZA) 1 G capsule, Take 2 capsules (2 g total) by mouth 2 (two) times daily., Disp: 360 capsule, Rfl: 3 .  pregabalin (LYRICA) 50 MG capsule, Take 1 capsule (50 mg total) by mouth 2 (two) times daily., Disp: 60 capsule, Rfl: 2 .  sitaGLIPtin (JANUVIA) 100 MG tablet, Take 1 tablet (100 mg total) by mouth daily., Disp: 30 tablet, Rfl: 5 .  triamterene-hydrochlorothiazide (DYAZIDE) 37.5-25 MG per capsule, Take 1 each (1 capsule total) by mouth daily., Disp: 30 capsule, Rfl: 4 .  Alirocumab (PRALUENT) 75 MG/ML SOPN, Inject 75 mg into the skin every 14 (fourteen) days. (Patient not taking: Reported on 07/11/2014), Disp: 6 pen, Rfl: 0  Allergies:  Allergies  Allergen Reactions  . Crestor [Rosuvastatin]     Muscle aches on 5 mg daily and 10 mg twice weekly  . Lipitor [Atorvastatin]   . Livalo [Pitavastatin]   . Zetia [Ezetimibe]   . Zocor [Simvastatin]     Muscle aches   . Sulfa Antibiotics Rash    Past Medical History, Surgical history, Social history, and Family History were reviewed and updated.  Review of Systems: As above  Physical Exam:  height is 5\' 3"  (1.6 m) and weight is 192 lb (87.091 kg). Her oral temperature is 97.9 F (36.6 C). Her blood pressure is 139/59 and her pulse is 68. Her respiration is 16.   Well developed  and well nourished. Lungs are clear. Cardiac exam regular rate and rhythm with no murmurs, rubs or bruits.. Neck without adenopathy. Her thyroid is nonpalpable. Ocular exam normal. Abdomen is soft. There is no palpable liver or spleen. She has good bowel sounds. Back exam shows no tenderness over the spine, ribs or hips.. Extremities some slight chronic nonpitting edema. She has good strength in her extremity is. She has good range of motion of her joints. Skin exam no rashes, ecchymoses or petechia. Neurological exam nonfocal.  Lab Results  Component Value Date   WBC 9.3 07/11/2014   HGB 14.1 07/11/2014   HCT 41.8 07/11/2014   MCV 92 07/11/2014   PLT 164  07/11/2014     Chemistry      Component Value Date/Time   NA 140 03/02/2014 1156   NA 141 01/18/2014 1104   NA 141 06/20/2013 1503   K 4.3 03/02/2014 1156   K 4.1 01/18/2014 1104   CL 101 03/02/2014 1156   CL 96* 01/18/2014 1104   CO2 27 03/02/2014 1156   CO2 28 01/18/2014 1104   BUN 22 03/02/2014 1156   BUN 21 01/18/2014 1104   BUN 22 06/20/2013 1503   CREATININE 1.55* 03/02/2014 1156   CREATININE 1.5* 01/18/2014 1104      Component Value Date/Time   CALCIUM 9.6 03/02/2014 1156   CALCIUM 10.0 01/18/2014 1104   ALKPHOS 68 03/02/2014 1156   ALKPHOS 68 01/18/2014 1104   AST 29 03/02/2014 1156   AST 29 01/18/2014 1104   ALT 31 03/02/2014 1156   ALT 34 01/18/2014 1104   BILITOT 0.4 03/02/2014 1156   BILITOT 0.70 01/18/2014 1104        Impression and Plan: [name] is 74 year old white female with hemachromatosis. She is a double heterozygote  We will see what her iron studies show. I would be surprised if she had a be phlebotomized but this is a was a possibility.  Again, I told her that her hemochromatosis is the least of her issues. She has a terrible cholesterol and a terrible cholesterol/HDL ratio. I this will be a much bigger problem for her.  We will go him plan to get her back in another 4 months.     Volanda Napoleon, MD 4/20/20161:27 PM

## 2014-07-12 ENCOUNTER — Other Ambulatory Visit: Payer: Self-pay | Admitting: *Deleted

## 2014-07-12 ENCOUNTER — Telehealth: Payer: Self-pay | Admitting: *Deleted

## 2014-07-12 ENCOUNTER — Telehealth: Payer: Self-pay | Admitting: Hematology & Oncology

## 2014-07-12 NOTE — Telephone Encounter (Signed)
Patient spoke with Rn.  Per Rn to sch 2 phl.  Patient sch appts for 4/26 and 5/3

## 2014-07-12 NOTE — Telephone Encounter (Signed)
-----   Message from Volanda Napoleon, MD sent at 07/11/2014  4:49 PM EDT ----- Call - iron is too HIGH!! It is 138!!! Need the phlebotomize 2 weeks in a row!!!  Please set this up!!  pete

## 2014-07-12 NOTE — Telephone Encounter (Signed)
Called patient to let her know that her iron is too high and she needs 2 phlebotomies a week apart.  Patient scheduled these appts accordingly.

## 2014-07-17 ENCOUNTER — Ambulatory Visit: Payer: Medicare Other

## 2014-07-17 NOTE — Patient Instructions (Signed)

## 2014-07-17 NOTE — Progress Notes (Signed)
Unsuccessful attempt for phlebotomy; patient instructed to return next week and to drink plenty of water prior to procedure. Patient verbalized understanding.

## 2014-07-23 DIAGNOSIS — M150 Primary generalized (osteo)arthritis: Secondary | ICD-10-CM | POA: Diagnosis not present

## 2014-07-24 ENCOUNTER — Ambulatory Visit (HOSPITAL_BASED_OUTPATIENT_CLINIC_OR_DEPARTMENT_OTHER): Payer: Medicare Other

## 2014-07-24 MED ORDER — SODIUM CHLORIDE 0.9 % IV SOLN
Freq: Once | INTRAVENOUS | Status: AC
  Administered 2014-07-24: 12:00:00 via INTRAVENOUS

## 2014-07-24 MED ORDER — FAMOTIDINE IN NACL 20-0.9 MG/50ML-% IV SOLN: 40.0000 mg | Freq: Once | INTRAVENOUS | Status: DC

## 2014-07-24 MED ORDER — METHYLPREDNISOLONE SODIUM SUCC 125 MG IJ SOLR: 125.0000 mg | Freq: Once | INTRAMUSCULAR | Status: DC

## 2014-07-24 NOTE — Progress Notes (Signed)
[name] presents today for phlebotomy per MD orders. Phlebotomy procedure started at 1200 and ended at 1215. 500 grams removed. At 1215, after needles removed, patient started saying she didn't feel well.  Was very pale, and for 10 seconds could not be aroused.  Patient diaphoretic.  IV started in left hand .9 NS wide open.  Patient had brief period of nausea.  No emesis.   Patient observed for60 minutes after procedure.  Dr. Marin Olp here to assess patient

## 2014-07-24 NOTE — Patient Instructions (Signed)

## 2014-07-30 ENCOUNTER — Ambulatory Visit (HOSPITAL_BASED_OUTPATIENT_CLINIC_OR_DEPARTMENT_OTHER): Payer: Medicare Other

## 2014-07-30 MED ORDER — SODIUM CHLORIDE 0.9 % IV SOLN
Freq: Once | INTRAVENOUS | Status: AC
  Administered 2014-07-30: 11:00:00 via INTRAVENOUS

## 2014-07-30 NOTE — Patient Instructions (Signed)

## 2014-07-30 NOTE — Progress Notes (Signed)
[name] presents today for phlebotomy per MD orders. Phlebotomy procedure started at 1100 and ended at 1115. 500 grams removed. Patient observed for 30 minutes after procedure without any incident. Patient tolerated procedure well. IV needle removed intact.

## 2014-08-02 ENCOUNTER — Telehealth: Payer: Self-pay | Admitting: Pharmacist

## 2014-08-02 NOTE — Telephone Encounter (Signed)
appt scheduled for 08/03/2014 at 11am to teach how to inject praluent.

## 2014-08-03 ENCOUNTER — Ambulatory Visit (INDEPENDENT_AMBULATORY_CARE_PROVIDER_SITE_OTHER): Payer: Medicare Other | Admitting: Pharmacist

## 2014-08-03 ENCOUNTER — Encounter: Payer: Self-pay | Admitting: Pharmacist

## 2014-08-03 VITALS — BP 138/60 | HR 70 | Ht 63.0 in | Wt 191.5 lb

## 2014-08-03 DIAGNOSIS — Z951 Presence of aortocoronary bypass graft: Secondary | ICD-10-CM | POA: Diagnosis not present

## 2014-08-03 DIAGNOSIS — E785 Hyperlipidemia, unspecified: Secondary | ICD-10-CM | POA: Diagnosis not present

## 2014-08-03 NOTE — Progress Notes (Signed)
Patient ID: Carmen [name], female   DOB: December 23, 1940, 74 y.o.   MRN: [id number]  Patient is a 74 y.o. WF in referred by Dr Sabra Heck for education on administration of Praulent.  She has type 2 DM and has had CABG in 2007/ CAD.   Patient has failed multiple statins, including Lipitor, Zocor, Livalo, Crestor (5 mg qd, and 10 mg twice weekly), and Zetia due to muscle and joint aches.   CRP was elevated at 7.5 mg/L when check about 1 year ago.  RF: CABG, Diabetes, HTN, elevated CRP, age - LDL goal < 70, non-HDL goal < 100 Meds: Lovaza 4 g/d. Intolerant: Lipitor, Zocor, Livalo, Crestor (5 mg qd, and 10 mg twice weekly), and Zetia    Labs: 05/22/2014:  Total Cholesterol = 279;  LDL = 180; TG = 306; HDL = 38 08/2013: CRP 7.5 mg/L  Current Outpatient Prescriptions  Medication Sig Dispense Refill  . allopurinol (ZYLOPRIM) 100 MG tablet Take 100 mg by mouth 2 (two) times daily.    Marland Kitchen amLODipine (NORVASC) 2.5 MG tablet TAKE 1 TABLET (2.5 MG TOTAL) BY MOUTH DAILY. 30 tablet 6  . aspirin 81 MG tablet Take 81 mg by mouth daily.    . colchicine 0.6 MG tablet Take 0.6 mg by mouth as needed.    . fluticasone (FLONASE) 50 MCG/ACT nasal spray Place 1 spray into the nose as needed.     . metoprolol tartrate (LOPRESSOR) 25 MG tablet TAKE 1 TABLET TWICE A DAY 180 tablet 5  . omega-3 acid ethyl esters (LOVAZA) 1 G capsule TAKE 2 CAPSULES BY MOUTH TWICE A DAY 120 capsule 3  . pregabalin (LYRICA) 50 MG capsule TAKE 1 CAPSULE 2 TIMES A DAY 60 capsule 11  . sitaGLIPtin (JANUVIA) 100 MG tablet Take 1 tablet (100 mg total) by mouth daily. 30 tablet 11  . triamterene-hydrochlorothiazide (DYAZIDE) 37.5-25 MG per capsule TAKE 1 CAPSULE BY MOUTH DAILY 30 capsule 11  . valsartan (DIOVAN) 160 MG tablet TAKE 1 TABLET EVERY DAY 30 tablet 11  . VOLTAREN 1 % GEL Apply 2 g topically as needed.      No current facility-administered  medications for this visit.   Allergies  Allergen Reactions  . Crestor [Rosuvastatin]     Muscle aches on 5 mg daily and 10 mg twice weekly  . Lipitor [Atorvastatin]   . Livalo [Pitavastatin]   . Zetia [Ezetimibe]   . Zocor [Simvastatin]     Muscle aches   . Sulfa Antibiotics Rash   No family history on file.              Assessment:  Hyperlipidemia with history of intolerance to multiple statins.   Plan: Patient is educated on injection site selection and prep and importance of rotating site Demonstrated proper injection technique with Praulent.  Patient self administered first praulent in office without problems.  Discussed proper storage and disposal of Praulent.   Reviewed possible side effects and also steps to take if she misses a dose.   Cherre Robins, PharmD, CPP

## 2014-08-08 ENCOUNTER — Encounter: Payer: Self-pay | Admitting: *Deleted

## 2014-08-08 ENCOUNTER — Ambulatory Visit (INDEPENDENT_AMBULATORY_CARE_PROVIDER_SITE_OTHER): Payer: Medicare Other

## 2014-08-08 ENCOUNTER — Ambulatory Visit (INDEPENDENT_AMBULATORY_CARE_PROVIDER_SITE_OTHER): Payer: Medicare Other | Admitting: *Deleted

## 2014-08-08 VITALS — BP 127/60 | HR 66 | Ht 62.5 in | Wt 193.0 lb

## 2014-08-08 DIAGNOSIS — Z780 Asymptomatic menopausal state: Secondary | ICD-10-CM

## 2014-08-08 DIAGNOSIS — Z0000 Encounter for general adult medical examination without abnormal findings: Secondary | ICD-10-CM

## 2014-08-08 NOTE — Progress Notes (Deleted)
IF YOU ARE IN IMMEDIATE DANGER CALL 911!  It is important for you to keep your follow-up appointment with your physician after discharge OR for you / your caregiver to make a follow-up appointment with your physician / medical provider after discharge.   Show these instructions to the next healthcare provider you see.  [place] YOUR HANDS! Hand washing is the number one way to prevent the spread of infection.  You can use soap and water or an alcohol based hand rub that contains at least 60% alcohol.  You should practice hand washing before and after touching your face, surgical incisions, wounds, preparing or eating food and using the restroom.  Do not let anyone else touch your surgical incisions or wounds without performing hand washing.  Be sure that they practice hand washing before putting on gloves and after removing them.   [employer] Phone Number  [place] 086 or [email or web address]: Abuse/Neglect Phone Number  [place]) [phone]  [place] [phone]  [place] Phone Number  [place]. [place] [phone]   Health Clinics Phone Number  Urgent [place] ([place]) Monday - Friday: 8:00 am - 9:00 pm Saturday and Sunday: 10:00 am - 9:00 pm (336) [phone]  [place]. [place] Monday - Friday: 8:30 am - 5:30 pm Saturday: 9:00 am - 1:00 pm ([phone]) [phone]   [place]: Abuse/Neglect Phone Number  Center Against [facility]) After hours, holidays and weekends [phone]  [place] [phone]  [place] (domestic violence) After hours,holidays and weekends Crisis line [phone]   [place] [phone]   [place] Phone Number  [place] [phone]   Health Clinics Phone Number  [facility] [phone]  [place] [phone]  Community Resources Phone Number  [facility] [phone]  Council on Aging [phone]  Help for the Homeless [phone]  Caregivers of [place] [phone]  [facility] [phone]   [place]: Abuse/Neglect Phone Number  Child/Elder Abuse Hotline 951-723-9992  Family Abuse Services 24hr crisis line [phone]  [place] [phone]  National Domestic Violence Hotline 3092220022  [place] Substance Use Phone Number  [place] 24hr crisis line & mobile crisis unit [phone]  [facility] Mon-Friday walk-in 8am-8pm [address] [place] [phone]  [place] [phone]  Alcoholics Anonymous Selena Lesser, [place]  [phone]  Narcotics Anonymous  [phone]  Health Clinics & Urgent Care Centers Phone Number  [place]  [phone]  [place] at [facility] [phone]  [place] [phone]  [facility] (uninsured patients meeting eligibility requirements) [phone]  [place] [phone]      [place] [phone]      [facility] [phone]      [facility] ([place]) [phone]      [facility] [phone]  Community Resources Phone Number  [place] and  Palliative Care Services [phone]) [phone]) [address] DSS ([place], nutrition, medicine assistance, utility assistance)  ([phone]  [place] [phone]) [phone]  [place] [facility] [phone]  [facility] [facility] (men's homeless shelter) [phone]  [facility] [place] (adult & family shelter, food, utility & rent assistance) [phone]       24hour Crisis line for those facing homelessness [phone] [place], [place], [facility] public transportation system) ([phone]  Homecare Providers (HIV/AIDS Case Management, FREE HIV SCREEN) ([phone]) [phone]  Medication Management (ongoing medication assistance for patients meeting eligibility requirements) [phone]  Medication Drop Box (safely rid of unused medication)      Locations: [place], [employer] Dept,                        [place] Police Dept, [place] office    [employer] (crisis assistance, medication, housing, food, utility assistance) [phone]  [facility]. [facility]) [phone]

## 2014-08-08 NOTE — Progress Notes (Signed)
Subjective:   Carmen [name] is a 74 y.o. female who presents for an Initial Medicare Annual Wellness Visit.  She is doing well today with no complaints.  She is retired from [employer].  She lives with her husband of 8 years, and they like to travel all over the world.  Hema has 3 daughters and 4 grandchildren.  She is very active in her church.  When she has pain, she says it is in her back or hands and is related to arthritis.  She has hematochromatosis and is phlebotomized to control her iron levels.  Review of Systems      Cardiac Risk Factors include: advanced age (>39men, >16 women);diabetes mellitus;dyslipidemia;hypertension     Objective:    Today's Vitals   08/08/14 1424  BP: 127/60  Pulse: 66  Height: 5' 2.5" (1.588 m)  Weight: 193 lb (87.544 kg)  PainSc: 0-No pain    Current Medications (verified) Outpatient Encounter Prescriptions as of 08/08/2014  Medication Sig  . Alirocumab (PRALUENT) 75 MG/ML SOPN Inject 75 mg into the skin every 14 (fourteen) days.  Marland Kitchen allopurinol (ZYLOPRIM) 100 MG tablet Take 1 tablet (100 mg total) by mouth 2 (two) times daily.  Marland Kitchen amLODipine (NORVASC) 2.5 MG tablet TAKE 1 TABLET (2.5 MG TOTAL) BY MOUTH DAILY.  Marland Kitchen aspirin 81 MG tablet Take 81 mg by mouth daily.  . colchicine 0.6 MG tablet Take 0.6 mg by mouth as needed.  Marland Kitchen DIOVAN 160 MG tablet TAKE 1 TABLET EVERY DAY  . fluticasone (FLONASE) 50 MCG/ACT nasal spray Place 1 spray into the nose as needed.   Marland Kitchen glucose blood test strip Use as instructed  . metoprolol tartrate (LOPRESSOR) 25 MG tablet Take 1 tablet (25 mg total) by mouth 2 (two) times daily.  Marland Kitchen omega-3 acid ethyl esters (LOVAZA) 1 G capsule Take 2 capsules (2 g total) by mouth 2 (two) times daily.  . pregabalin (LYRICA) 50 MG capsule Take 1 capsule (50 mg total) by mouth 2 (two) times daily.  . sitaGLIPtin (JANUVIA) 100 MG tablet Take 1 tablet (100 mg total) by mouth daily.  Marland Kitchen triamterene-hydrochlorothiazide  (DYAZIDE) 37.5-25 MG per capsule Take 1 each (1 capsule total) by mouth daily.  . VOLTAREN 1 % GEL Place onto the skin 4 (four) times daily as needed.   No facility-administered encounter medications on file as of 08/08/2014.    Allergies (verified) Crestor; Lipitor; Livalo; Zetia; Zocor; and Sulfa antibiotics   History: Past Medical History  Diagnosis Date  . Anemia   . Hypertension   . Arthritis   . Hyperlipidemia   . Diverticulosis   . Gout   . Diabetes mellitus without complication   . CAD (coronary artery disease)   . Hemochromatosis, hereditary 11/24/2012  . Neuromuscular disorder     peripheral neuropathy   Past Surgical History  Procedure Laterality Date  . Lipoma excision      back  . Breast surgery      left breast lump--benign  . Cardiac catheterization  06/03/2005  . Coronary artery bypass graft  2007    x2   Family History  Problem Relation Age of Onset  . Stroke Mother   . Hypertension Mother   . Neuropathy Mother   . Stroke Father   . Hypertension Father   . Diabetes Father   . Cancer Sister 33    sarcoma  . Diabetes Brother   . Arthritis Sister   . Arthritis Sister   . Lupus Sister   .  Hemachromatosis Sister   . Arthritis Sister   . Hemachromatosis Sister   . Hemachromatosis Sister   . Diverticulitis Sister   . Diabetes Brother   . Diabetes Brother   . Hemachromatosis Brother    Social History   Occupational History  . Not on file.   Social History Main Topics  . Smoking status: Never Smoker   . Smokeless tobacco: Never Used     Comment: never used tobacco  . Alcohol Use: No  . Drug Use: No  . Sexual Activity: Not Currently       Activities of Daily Living In your present state of health, do you have any difficulty performing the following activities: 08/08/2014  Hearing? N  Vision? N  Difficulty concentrating or making decisions? N  Walking or climbing stairs? Y  Dressing or bathing? N  Doing errands, shopping? N  Preparing  Food and eating ? N  Using the Toilet? N  In the past six months, have you accidently leaked urine? Y  Do you have problems with loss of bowel control? N  Managing your Medications? N  Managing your Finances? N  Housekeeping or managing your Housekeeping? N    Immunizations and Health Maintenance Immunization History  Administered Date(s) Administered  . Influenza, High Dose Seasonal PF 12/09/2012, 12/26/2013  . Influenza-Unspecified 12/21/2013   Health Maintenance Due  Topic Date Due  . TETANUS/TDAP  06/06/1959  . ZOSTAVAX  06/05/2000  . DEXA SCAN  06/05/2005  . PNA vac Low Risk Adult (1 of 2 - PCV13) 06/05/2005  . OPHTHALMOLOGY EXAM  01/09/2014  . FOOT EXAM  02/28/2014    Patient Care Team: Wardell Honour, MD as PCP - General (Family Medicine) Volanda Napoleon, MD as Consulting Physician (Oncology)       Assessment:   This is a routine wellness examination for Carmen [name].   Hearing/Vision screen She is seen by Dr. Truman Hayward at [facility] in [place], [place]  Seen December 2015. Called for results Patient wears hearing aids bilaterally   Dietary issues and exercise activities discussed: Current Exercise Habits:: Home exercise routine, Type of exercise: walking, Time (Minutes): 15, Frequency (Times/Week): 3, Weekly Exercise (Minutes/Week): 45, Intensity: Moderate  Goals    . LDL CALC < 70     Increase walking for exercise- to 15-20 minutes at least 3 days per week   Depression Screen PHQ 2/9 Scores 08/08/2014 05/22/2014 06/20/2013  PHQ - 2 Score 0 0 0    Fall Risk Fall Risk  08/08/2014 07/17/2014 07/11/2014 01/23/2014 01/18/2014  Falls in the past year? No No No No No    Cognitive Function: MMSE - Mini Mental State Exam 08/08/2014  Orientation to time 5  Orientation to Place 5  Registration 3  Attention/ Calculation 5  Recall 3  Language- name 2 objects 2  Language- repeat 1  Language- follow 3 step command 3  Language- read & follow direction 1  Write a  sentence 1  Copy design 1  Total score 30    Screening Tests Health Maintenance  Topic Date Due  . TETANUS/TDAP  06/06/1959  . ZOSTAVAX  06/05/2000  . DEXA SCAN  06/05/2005  . PNA vac Low Risk Adult (1 of 2 - PCV13) 06/05/2005  . OPHTHALMOLOGY EXAM  01/09/2014  . FOOT EXAM  02/28/2014  . INFLUENZA VACCINE  10/22/2014  . HEMOGLOBIN A1C  11/22/2014  . URINE MICROALBUMIN  02/02/2015  . MAMMOGRAM  02/28/2015  . COLONOSCOPY  10/21/2022  Plan:    Increase walking for exercise- to 15-20 minutes at least 3 days per week Include mostly lean meats, vegetables and fruit in moderation in diet   During the course of the visit, Terrilyn was educated and counseled about the following appropriate screening and preventive services:   Vaccines to include Pneumoccal, Influenza, Td, Zostavax- declined prevnar 49, tdap, and zostavax         She states she has had pneumovax at a local pharmacy and may still have paper work, requested that she bring a copy to office  Electrocardiogram- up to date  Cardiovascular disease screening-  Followed by Dr. Debara Pickett at [facility] cardiovascular  Colorectal cancer screening- up to date  Bone density screening- done today  Diabetes screening- up to date  Glaucoma screening- up to date  Mammography/PAP  Nutrition counseling- discussed today  Patient Instructions (the written plan) were given to the patient.    WYATT, AMY M, RN   08/08/2014      I have reviewed and agree with the above AWV documentation.  Claretta Fraise, M.D.

## 2014-08-08 NOTE — Patient Instructions (Signed)
Thank you for coming in for your annual wellness visit today!   Please check to see if you have the paperwork from when you received the pneumonia vaccine a few years ago.  If you do have it, please bring it with you to your next office visit so that we may file the information in your chart.  Also there is another pneumonia vaccine called Prevnar 13 that you are eligible if it has been one year since your first pneumonia vaccine.  Preventive Care for Adults A healthy lifestyle and preventive care can promote health and wellness. Preventive health guidelines for women include the following key practices.  A routine yearly physical is a good way to check with your health care provider about your health and preventive screening. It is a chance to share any concerns and updates on your health and to receive a thorough exam.  Visit your dentist for a routine exam and preventive care every 6 months. Brush your teeth twice a day and floss once a day. Good oral hygiene prevents tooth decay and gum disease.  The frequency of eye exams is based on your age, health, family medical history, use of contact lenses, and other factors. Follow your health care provider's recommendations for frequency of eye exams.  Eat a healthy diet. Foods like vegetables, fruits, whole grains, low-fat dairy products, and lean protein foods contain the nutrients you need without too many calories. Decrease your intake of foods high in solid fats, added sugars, and salt. Eat the right amount of calories for you.Get information about a proper diet from your health care provider, if necessary.  Regular physical exercise is one of the most important things you can do for your health. Most adults should get at least 150 minutes of moderate-intensity exercise (any activity that increases your heart rate and causes you to sweat) each week. In addition, most adults need muscle-strengthening exercises on 2 or more days a week.  Maintain a  healthy weight. The body mass index (BMI) is a screening tool to identify possible weight problems. It provides an estimate of body fat based on height and weight. Your health care provider can find your BMI and can help you achieve or maintain a healthy weight.For adults 20 years and older:  A BMI below 18.5 is considered underweight.  A BMI of 18.5 to 24.9 is normal.  A BMI of 25 to 29.9 is considered overweight.  A BMI of 30 and above is considered obese.  Maintain normal blood lipids and cholesterol levels by exercising and minimizing your intake of saturated fat. Eat a balanced diet with plenty of fruit and vegetables. Blood tests for lipids and cholesterol should begin at age 50 and be repeated every 5 years. If your lipid or cholesterol levels are high, you are over 50, or you are at high risk for heart disease, you may need your cholesterol levels checked more frequently.Ongoing high lipid and cholesterol levels should be treated with medicines if diet and exercise are not working.  If you smoke, find out from your health care provider how to quit. If you do not use tobacco, do not start.  Lung cancer screening is recommended for adults aged 81-80 years who are at high risk for developing lung cancer because of a history of smoking. A yearly low-dose CT scan of the lungs is recommended for people who have at least a 30-pack-year history of smoking and are a current smoker or have quit within the past 15 years.  A pack year of smoking is smoking an average of 1 pack of cigarettes a day for 1 year (for example: 1 pack a day for 30 years or 2 packs a day for 15 years). Yearly screening should continue until the smoker has stopped smoking for at least 15 years. Yearly screening should be stopped for people who develop a health problem that would prevent them from having lung cancer treatment.  If you are pregnant, do not drink alcohol. If you are breastfeeding, be very cautious about drinking  alcohol. If you are not pregnant and choose to drink alcohol, do not have more than 1 drink per day. One drink is considered to be 12 ounces (355 mL) of beer, 5 ounces (148 mL) of wine, or 1.5 ounces (44 mL) of liquor.  Avoid use of street drugs. Do not share needles with anyone. Ask for help if you need support or instructions about stopping the use of drugs.  High blood pressure causes heart disease and increases the risk of stroke. Your blood pressure should be checked at least every 1 to 2 years. Ongoing high blood pressure should be treated with medicines if weight loss and exercise do not work.  If you are 3-38 years old, ask your health care provider if you should take aspirin to prevent strokes.  Diabetes screening involves taking a blood sample to check your fasting blood sugar level. This should be done once every 3 years, after age 95, if you are within normal weight and without risk factors for diabetes. Testing should be considered at a younger age or be carried out more frequently if you are overweight and have at least 1 risk factor for diabetes.  Breast cancer screening is essential preventive care for women. You should practice "breast self-awareness." This means understanding the normal appearance and feel of your breasts and may include breast self-examination. Any changes detected, no matter how small, should be reported to a health care provider. Women in their 60s and 30s should have a clinical breast exam (CBE) by a health care provider as part of a regular health exam every 1 to 3 years. After age 92, women should have a CBE every year. Starting at age 65, women should consider having a mammogram (breast X-ray test) every year. Women who have a family history of breast cancer should talk to their health care provider about genetic screening. Women at a high risk of breast cancer should talk to their health care providers about having an MRI and a mammogram every year.  Breast  cancer gene (BRCA)-related cancer risk assessment is recommended for women who have family members with BRCA-related cancers. BRCA-related cancers include breast, ovarian, tubal, and peritoneal cancers. Having family members with these cancers may be associated with an increased risk for harmful changes (mutations) in the breast cancer genes BRCA1 and BRCA2. Results of the assessment will determine the need for genetic counseling and BRCA1 and BRCA2 testing.  Routine pelvic exams to screen for cancer are no longer recommended for nonpregnant women who are considered low risk for cancer of the pelvic organs (ovaries, uterus, and vagina) and who do not have symptoms. Ask your health care provider if a screening pelvic exam is right for you.  If you have had past treatment for cervical cancer or a condition that could lead to cancer, you need Pap tests and screening for cancer for at least 20 years after your treatment. If Pap tests have been discontinued, your risk factors (such as  having a new sexual partner) need to be reassessed to determine if screening should be resumed. Some women have medical problems that increase the chance of getting cervical cancer. In these cases, your health care provider may recommend more frequent screening and Pap tests.  The HPV test is an additional test that may be used for cervical cancer screening. The HPV test looks for the virus that can cause the cell changes on the cervix. The cells collected during the Pap test can be tested for HPV. The HPV test could be used to screen women aged 47 years and older, and should be used in women of any age who have unclear Pap test results. After the age of 30, women should have HPV testing at the same frequency as a Pap test.  Colorectal cancer can be detected and often prevented. Most routine colorectal cancer screening begins at the age of 79 years and continues through age 18 years. However, your health care provider may recommend  screening at an earlier age if you have risk factors for colon cancer. On a yearly basis, your health care provider may provide home test kits to check for hidden blood in the stool. Use of a small camera at the end of a tube, to directly examine the colon (sigmoidoscopy or colonoscopy), can detect the earliest forms of colorectal cancer. Talk to your health care provider about this at age 96, when routine screening begins. Direct exam of the colon should be repeated every 5-10 years through age 38 years, unless early forms of pre-cancerous polyps or small growths are found.  People who are at an increased risk for hepatitis B should be screened for this virus. You are considered at high risk for hepatitis B if:  You were born in a country where hepatitis B occurs often. Talk with your health care provider about which countries are considered high risk.  Your parents were born in a high-risk country and you have not received a shot to protect against hepatitis B (hepatitis B vaccine).  You have HIV or AIDS.  You use needles to inject street drugs.  You live with, or have sex with, someone who has hepatitis B.  You get hemodialysis treatment.  You take certain medicines for conditions like cancer, organ transplantation, and autoimmune conditions.  Hepatitis C blood testing is recommended for all people born from [birth year] through 1965 and any individual with known risks for hepatitis C.  Practice safe sex. Use condoms and avoid high-risk sexual practices to reduce the spread of sexually transmitted infections (STIs). STIs include gonorrhea, chlamydia, syphilis, trichomonas, herpes, HPV, and human immunodeficiency virus (HIV). Herpes, HIV, and HPV are viral illnesses that have no cure. They can result in disability, cancer, and death.  You should be screened for sexually transmitted illnesses (STIs) including gonorrhea and chlamydia if:  You are sexually active and are younger than 24 years.  You  are older than 24 years and your health care provider tells you that you are at risk for this type of infection.  Your sexual activity has changed since you were last screened and you are at an increased risk for chlamydia or gonorrhea. Ask your health care provider if you are at risk.  If you are at risk of being infected with HIV, it is recommended that you take a prescription medicine daily to prevent HIV infection. This is called preexposure prophylaxis (PrEP). You are considered at risk if:  You are a heterosexual woman, are sexually active, and  are at increased risk for HIV infection.  You take drugs by injection.  You are sexually active with a partner who has HIV.  Talk with your health care provider about whether you are at high risk of being infected with HIV. If you choose to begin PrEP, you should first be tested for HIV. You should then be tested every 3 months for as long as you are taking PrEP.  Osteoporosis is a disease in which the bones lose minerals and strength with aging. This can result in serious bone fractures or breaks. The risk of osteoporosis can be identified using a bone density scan. Women ages 34 years and over and women at risk for fractures or osteoporosis should discuss screening with their health care providers. Ask your health care provider whether you should take a calcium supplement or vitamin D to reduce the rate of osteoporosis.  Menopause can be associated with physical symptoms and risks. Hormone replacement therapy is available to decrease symptoms and risks. You should talk to your health care provider about whether hormone replacement therapy is right for you.  Use sunscreen. Apply sunscreen liberally and repeatedly throughout the day. You should seek shade when your shadow is shorter than you. Protect yourself by wearing long sleeves, pants, a wide-brimmed hat, and sunglasses year round, whenever you are outdoors.  Once a month, do a whole body skin  exam, using a mirror to look at the skin on your back. Tell your health care provider of new moles, moles that have irregular borders, moles that are larger than a pencil eraser, or moles that have changed in shape or color.  Stay current with required vaccines (immunizations).  Influenza vaccine. All adults should be immunized every year.  Tetanus, diphtheria, and acellular pertussis (Td, Tdap) vaccine. Pregnant women should receive 1 dose of Tdap vaccine during each pregnancy. The dose should be obtained regardless of the length of time since the last dose. Immunization is preferred during the 27th-36th week of gestation. An adult who has not previously received Tdap or who does not know her vaccine status should receive 1 dose of Tdap. This initial dose should be followed by tetanus and diphtheria toxoids (Td) booster doses every 10 years. Adults with an unknown or incomplete history of completing a 3-dose immunization series with Td-containing vaccines should begin or complete a primary immunization series including a Tdap dose. Adults should receive a Td booster every 10 years.  Varicella vaccine. An adult without evidence of immunity to varicella should receive 2 doses or a second dose if she has previously received 1 dose. Pregnant females who do not have evidence of immunity should receive the first dose after pregnancy. This first dose should be obtained before leaving the health care facility. The second dose should be obtained 4-8 weeks after the first dose.  Human papillomavirus (HPV) vaccine. Females aged 13-26 years who have not received the vaccine previously should obtain the 3-dose series. The vaccine is not recommended for use in pregnant females. However, pregnancy testing is not needed before receiving a dose. If a female is found to be pregnant after receiving a dose, no treatment is needed. In that case, the remaining doses should be delayed until after the pregnancy. Immunization is  recommended for any person with an immunocompromised condition through the age of 70 years if she did not get any or all doses earlier. During the 3-dose series, the second dose should be obtained 4-8 weeks after the first dose. The third dose  should be obtained 24 weeks after the first dose and 16 weeks after the second dose.  Zoster vaccine. One dose is recommended for adults aged 75 years or older unless certain conditions are present.  Measles, mumps, and rubella (MMR) vaccine. Adults born before [birth year] generally are considered immune to measles and mumps. Adults born in [birth year] or later should have 1 or more doses of MMR vaccine unless there is a contraindication to the vaccine or there is laboratory evidence of immunity to each of the three diseases. A routine second dose of MMR vaccine should be obtained at least 28 days after the first dose for students attending postsecondary schools, health care workers, or international travelers. People who received inactivated measles vaccine or an unknown type of measles vaccine during 1963-1967 should receive 2 doses of MMR vaccine. People who received inactivated mumps vaccine or an unknown type of mumps vaccine before 1979 and are at high risk for mumps infection should consider immunization with 2 doses of MMR vaccine. For females of childbearing age, rubella immunity should be determined. If there is no evidence of immunity, females who are not pregnant should be vaccinated. If there is no evidence of immunity, females who are pregnant should delay immunization until after pregnancy. Unvaccinated health care workers born before [birth year] who lack laboratory evidence of measles, mumps, or rubella immunity or laboratory confirmation of disease should consider measles and mumps immunization with 2 doses of MMR vaccine or rubella immunization with 1 dose of MMR vaccine.  Pneumococcal 13-valent conjugate (PCV13) vaccine. When indicated, a person who is uncertain of her  immunization history and has no record of immunization should receive the PCV13 vaccine. An adult aged 80 years or older who has certain medical conditions and has not been previously immunized should receive 1 dose of PCV13 vaccine. This PCV13 should be followed with a dose of pneumococcal polysaccharide (PPSV23) vaccine. The PPSV23 vaccine dose should be obtained at least 8 weeks after the dose of PCV13 vaccine. An adult aged 67 years or older who has certain medical conditions and previously received 1 or more doses of PPSV23 vaccine should receive 1 dose of PCV13. The PCV13 vaccine dose should be obtained 1 or more years after the last PPSV23 vaccine dose.  Pneumococcal polysaccharide (PPSV23) vaccine. When PCV13 is also indicated, PCV13 should be obtained first. All adults aged 10 years and older should be immunized. An adult younger than age 36 years who has certain medical conditions should be immunized. Any person who resides in a nursing home or long-term care facility should be immunized. An adult smoker should be immunized. People with an immunocompromised condition and certain other conditions should receive both PCV13 and PPSV23 vaccines. People with human immunodeficiency virus (HIV) infection should be immunized as soon as possible after diagnosis. Immunization during chemotherapy or radiation therapy should be avoided. Routine use of PPSV23 vaccine is not recommended for American Indians, [place] Natives, or people younger than 65 years unless there are medical conditions that require PPSV23 vaccine. When indicated, people who have unknown immunization and have no record of immunization should receive PPSV23 vaccine. One-time revaccination 5 years after the first dose of PPSV23 is recommended for people aged 19-64 years who have chronic kidney failure, nephrotic syndrome, asplenia, or immunocompromised conditions. People who received 1-2 doses of PPSV23 before age 51 years should receive another  dose of PPSV23 vaccine at age 90 years or later if at least 5 years have passed since the previous dose. Doses of  PPSV23 are not needed for people immunized with PPSV23 at or after age 88 years.  Meningococcal vaccine. Adults with asplenia or persistent complement component deficiencies should receive 2 doses of quadrivalent meningococcal conjugate (MenACWY-D) vaccine. The doses should be obtained at least 2 months apart. Microbiologists working with certain meningococcal bacteria, [employer] recruits, people at risk during an outbreak, and people who travel to or live in countries with a high rate of meningitis should be immunized. A first-year college student up through age 22 years who is living in a residence hall should receive a dose if she did not receive a dose on or after her 16th birthday. Adults who have certain high-risk conditions should receive one or more doses of vaccine.  Hepatitis A vaccine. Adults who wish to be protected from this disease, have certain high-risk conditions, work with hepatitis A-infected animals, work in hepatitis A research labs, or travel to or work in countries with a high rate of hepatitis A should be immunized. Adults who were previously unvaccinated and who anticipate close contact with an international adoptee during the first 60 days after arrival in the [place] from a country with a high rate of hepatitis A should be immunized.  Hepatitis B vaccine. Adults who wish to be protected from this disease, have certain high-risk conditions, may be exposed to blood or other infectious body fluids, are household contacts or sex partners of hepatitis B positive people, are clients or workers in certain care facilities, or travel to or work in countries with a high rate of hepatitis B should be immunized.  Haemophilus influenzae type b (Hib) vaccine. A previously unvaccinated person with asplenia or sickle cell disease or having a scheduled splenectomy should receive 1  dose of Hib vaccine. Regardless of previous immunization, a recipient of a hematopoietic stem cell transplant should receive a 3-dose series 6-12 months after her successful transplant. Hib vaccine is not recommended for adults with HIV infection. Preventive Services / Frequency Ages 52 to 62 years  Blood pressure check.** / Every 1 to 2 years.  Lipid and cholesterol check.** / Every 5 years beginning at age 94.  Clinical breast exam.** / Every 3 years for women in their 69s and 68s.  BRCA-related cancer risk assessment.** / For women who have family members with a BRCA-related cancer (breast, ovarian, tubal, or peritoneal cancers).  Pap test.** / Every 2 years from ages 37 through 83. Every 3 years starting at age 88 through age 54 or 1 with a history of 3 consecutive normal Pap tests.  HPV screening.** / Every 3 years from ages 44 through ages 69 to 35 with a history of 3 consecutive normal Pap tests.  Hepatitis C blood test.** / For any individual with known risks for hepatitis C.  Skin self-exam. / Monthly.  Influenza vaccine. / Every year.  Tetanus, diphtheria, and acellular pertussis (Tdap, Td) vaccine.** / Consult your health care provider. Pregnant women should receive 1 dose of Tdap vaccine during each pregnancy. 1 dose of Td every 10 years.  Varicella vaccine.** / Consult your health care provider. Pregnant females who do not have evidence of immunity should receive the first dose after pregnancy.  HPV vaccine. / 3 doses over 6 months, if 54 and younger. The vaccine is not recommended for use in pregnant females. However, pregnancy testing is not needed before receiving a dose.  Measles, mumps, rubella (MMR) vaccine.** / You need at least 1 dose of MMR if you were born in 1957 or  later. You may also need a 2nd dose. For females of childbearing age, rubella immunity should be determined. If there is no evidence of immunity, females who are not pregnant should be vaccinated. If  there is no evidence of immunity, females who are pregnant should delay immunization until after pregnancy.  Pneumococcal 13-valent conjugate (PCV13) vaccine.** / Consult your health care provider.  Pneumococcal polysaccharide (PPSV23) vaccine.** / 1 to 2 doses if you smoke cigarettes or if you have certain conditions.  Meningococcal vaccine.** / 1 dose if you are age 28 to 49 years and a Market researcher living in a residence hall, or have one of several medical conditions, you need to get vaccinated against meningococcal disease. You may also need additional booster doses.  Hepatitis A vaccine.** / Consult your health care provider.  Hepatitis B vaccine.** / Consult your health care provider.  Haemophilus influenzae type b (Hib) vaccine.** / Consult your health care provider. Ages 63 to 66 years  Blood pressure check.** / Every 1 to 2 years.  Lipid and cholesterol check.** / Every 5 years beginning at age 25 years.  Lung cancer screening. / Every year if you are aged 76-80 years and have a 30-pack-year history of smoking and currently smoke or have quit within the past 15 years. Yearly screening is stopped once you have quit smoking for at least 15 years or develop a health problem that would prevent you from having lung cancer treatment.  Clinical breast exam.** / Every year after age 78 years.  BRCA-related cancer risk assessment.** / For women who have family members with a BRCA-related cancer (breast, ovarian, tubal, or peritoneal cancers).  Mammogram.** / Every year beginning at age 6 years and continuing for as long as you are in good health. Consult with your health care provider.  Pap test.** / Every 3 years starting at age 74 years through age 48 or 55 years with a history of 3 consecutive normal Pap tests.  HPV screening.** / Every 3 years from ages 77 years through ages 45 to 66 years with a history of 3 consecutive normal Pap tests.  Fecal occult blood test  (FOBT) of stool. / Every year beginning at age 48 years and continuing until age 56 years. You may not need to do this test if you get a colonoscopy every 10 years.  Flexible sigmoidoscopy or colonoscopy.** / Every 5 years for a flexible sigmoidoscopy or every 10 years for a colonoscopy beginning at age 37 years and continuing until age 64 years.  Hepatitis C blood test.** / For all people born from [birth year] through 1965 and any individual with known risks for hepatitis C.  Skin self-exam. / Monthly.  Influenza vaccine. / Every year.  Tetanus, diphtheria, and acellular pertussis (Tdap/Td) vaccine.** / Consult your health care provider. Pregnant women should receive 1 dose of Tdap vaccine during each pregnancy. 1 dose of Td every 10 years.  Varicella vaccine.** / Consult your health care provider. Pregnant females who do not have evidence of immunity should receive the first dose after pregnancy.  Zoster vaccine.** / 1 dose for adults aged 65 years or older.  Measles, mumps, rubella (MMR) vaccine.** / You need at least 1 dose of MMR if you were born in 1957 or later. You may also need a 2nd dose. For females of childbearing age, rubella immunity should be determined. If there is no evidence of immunity, females who are not pregnant should be vaccinated. If there is no evidence of immunity,  females who are pregnant should delay immunization until after pregnancy.  Pneumococcal 13-valent conjugate (PCV13) vaccine.** / Consult your health care provider.  Pneumococcal polysaccharide (PPSV23) vaccine.** / 1 to 2 doses if you smoke cigarettes or if you have certain conditions.  Meningococcal vaccine.** / Consult your health care provider.  Hepatitis A vaccine.** / Consult your health care provider.  Hepatitis B vaccine.** / Consult your health care provider.  Haemophilus influenzae type b (Hib) vaccine.** / Consult your health care provider. Ages 33 years and over  Blood pressure check.** / Every  1 to 2 years.  Lipid and cholesterol check.** / Every 5 years beginning at age 18 years.  Lung cancer screening. / Every year if you are aged 69-80 years and have a 30-pack-year history of smoking and currently smoke or have quit within the past 15 years. Yearly screening is stopped once you have quit smoking for at least 15 years or develop a health problem that would prevent you from having lung cancer treatment.  Clinical breast exam.** / Every year after age 74 years.  BRCA-related cancer risk assessment.** / For women who have family members with a BRCA-related cancer (breast, ovarian, tubal, or peritoneal cancers).  Mammogram.** / Every year beginning at age 23 years and continuing for as long as you are in good health. Consult with your health care provider.  Pap test.** / Every 3 years starting at age 59 years through age 41 or 64 years with 3 consecutive normal Pap tests. Testing can be stopped between 65 and 70 years with 3 consecutive normal Pap tests and no abnormal Pap or HPV tests in the past 10 years.  HPV screening.** / Every 3 years from ages 37 years through ages 8 or 31 years with a history of 3 consecutive normal Pap tests. Testing can be stopped between 65 and 70 years with 3 consecutive normal Pap tests and no abnormal Pap or HPV tests in the past 10 years.  Fecal occult blood test (FOBT) of stool. / Every year beginning at age 3 years and continuing until age 57 years. You may not need to do this test if you get a colonoscopy every 10 years.  Flexible sigmoidoscopy or colonoscopy.** / Every 5 years for a flexible sigmoidoscopy or every 10 years for a colonoscopy beginning at age 75 years and continuing until age 30 years.  Hepatitis C blood test.** / For all people born from [birth year] through 1965 and any individual with known risks for hepatitis C.  Osteoporosis screening.** / A one-time screening for women ages 48 years and over and women at risk for fractures or  osteoporosis.  Skin self-exam. / Monthly.  Influenza vaccine. / Every year.  Tetanus, diphtheria, and acellular pertussis (Tdap/Td) vaccine.** / 1 dose of Td every 10 years.  Varicella vaccine.** / Consult your health care provider.  Zoster vaccine.** / 1 dose for adults aged 37 years or older.  Pneumococcal 13-valent conjugate (PCV13) vaccine.** / Consult your health care provider.  Pneumococcal polysaccharide (PPSV23) vaccine.** / 1 dose for all adults aged 68 years and older.  Meningococcal vaccine.** / Consult your health care provider.  Hepatitis A vaccine.** / Consult your health care provider.  Hepatitis B vaccine.** / Consult your health care provider.  Haemophilus influenzae type b (Hib) vaccine.** / Consult your health care provider. ** Family history and personal history of risk and conditions may change your health care provider's recommendations. Document Released: 05/05/2001 Document Revised: 07/24/2013 Document Reviewed: 08/04/2010 Exit[employer] Patient Information  2015 [employer]. This information is not intended to replace advice given to you by your health care provider. Make sure you discuss any questions you have with your health care provider.  

## 2014-09-14 ENCOUNTER — Other Ambulatory Visit: Payer: Self-pay | Admitting: Family

## 2014-09-14 NOTE — Telephone Encounter (Signed)
Last seen 05/22/14 Dr Sabra Heck  If approved print

## 2014-09-14 NOTE — Telephone Encounter (Signed)
Last seen 05/22/14  Dr Sabra Heck  If approved print

## 2014-09-14 NOTE — Telephone Encounter (Signed)
This is okay to refill 1 

## 2014-10-12 ENCOUNTER — Other Ambulatory Visit: Payer: Self-pay | Admitting: Family Medicine

## 2014-10-12 NOTE — Telephone Encounter (Signed)
Route to pool, [facility]

## 2014-10-15 NOTE — Telephone Encounter (Signed)
[facility] for Lyrica [facility] Okayed per [facility]

## 2014-10-17 ENCOUNTER — Other Ambulatory Visit: Payer: Self-pay | Admitting: *Deleted

## 2014-10-17 MED ORDER — ALLOPURINOL 100 MG PO TABS: 100.0000 mg | ORAL_TABLET | Freq: Two times a day (BID) | ORAL | Status: DC

## 2014-10-17 MED ORDER — TRIAMTERENE-HCTZ 37.5-25 MG PO CAPS: 1.0000 | ORAL_CAPSULE | Freq: Every day | ORAL | Status: DC

## 2014-10-18 ENCOUNTER — Ambulatory Visit (INDEPENDENT_AMBULATORY_CARE_PROVIDER_SITE_OTHER): Payer: Medicare Other | Admitting: Internal Medicine

## 2014-10-18 VITALS — BP 120/68 | HR 76 | Ht 63.0 in | Wt 195.4 lb

## 2014-10-18 DIAGNOSIS — E1149 Type 2 diabetes mellitus with other diabetic neurological complication: Secondary | ICD-10-CM

## 2014-10-18 DIAGNOSIS — I10 Essential (primary) hypertension: Secondary | ICD-10-CM | POA: Diagnosis not present

## 2014-10-18 DIAGNOSIS — Z789 Other specified health status: Secondary | ICD-10-CM

## 2014-10-18 DIAGNOSIS — Z889 Allergy status to unspecified drugs, medicaments and biological substances status: Secondary | ICD-10-CM

## 2014-10-18 DIAGNOSIS — E785 Hyperlipidemia, unspecified: Secondary | ICD-10-CM | POA: Diagnosis not present

## 2014-10-18 DIAGNOSIS — Z951 Presence of aortocoronary bypass graft: Secondary | ICD-10-CM | POA: Diagnosis not present

## 2014-10-18 NOTE — Patient Instructions (Signed)
Your physician wants you to follow-up in: 12 months with Dr. Hilty. You will receive a reminder letter in the mail two months in advance. If you don't receive a letter, please call our office to schedule the follow-up appointment.  

## 2014-10-19 ENCOUNTER — Encounter: Payer: Self-pay | Admitting: Internal Medicine

## 2014-10-19 NOTE — Progress Notes (Signed)
OFFICE NOTE  Chief Complaint:  Annual office visit  Primary Care Physician: Wardell Honour, MD  HPI:  Carmen [name] is a 74 year-old female comes in for followup of coronary disease. She had bypass surgery in 2007 and at that time had a 40% ejection fraction. She had a nuclear study in May of 2012 that was a low-risk study, but we could not calculate an ejection fraction because of ectopy. She has had no episodes of angina, no unusual shortness of breath unless she walks up a hill or a flight of steps, and then she becomes mildly breathless. She does not have PND or orthopnea. She does not have a productive cough. She has had no awareness of any arrhythmias, no dizziness, no syncope. She does have mild left ankle edema that is dependent in nature. She wears support hose infrequently for this. She has recurrent problems with gout, usually in her great toe. She has made some significant dietary changes, and the gout episodes seem to be less.  She has been diabetic for around 2 years. She has a hemoglobin A1c of 5.8, has had no significant episodes of hypoglycemia. She has noted some proximal lower extremity weakness. She gave an example working in her garden, when she tried to get up her legs were so weak that she had to get up on all fours, and one time her husband had to get her up. She has gone through physical therapy recently because of right hip bursitis, and it may very well be that she needs additional physical therapy for proximal muscle weakness and core strength building. No significant problems from the pollen. She has had some problems with constipation. Otherwise, no new complaints today.  Of note she's had a markedly abnormal lipid profile with particle numbers greater than 3000. She has been intolerant to all statins and has failed Crestor, Zetia, Zocor, Lipitor and Livalo in the past. I feel that she remains at very high risk of cardiac events and there is a great  likelihood that she may develop premature graft failure with her high cholesterol numbers. We did discuss the new PCSK9 inhibitors, for which she may be a great candidate if she is willing to give herself an injection every 2-4 weeks..  I saw Ms. Hesler back in the office today. She is doing really well and denies any chest pain or worsening shortness of breath. She was referred for a trial of a study drug for PCS K9 inhibition. Unfortunately this was never arranged however subsequently to [facility] SK 9 inhibitors came out to the market. She was subsequently started on Praluent by her primary care provider and is been using that for several months. Hopefully this will get her cholesterol down to goal.  PMHx:  Past Medical History  Diagnosis Date  . Anemia   . Hypertension   . Arthritis   . Hyperlipidemia   . Diverticulosis   . Gout   . Diabetes mellitus without complication   . CAD (coronary artery disease)   . Hemochromatosis, hereditary 11/24/2012  . Neuromuscular disorder     peripheral neuropathy    Past Surgical History  Procedure Laterality Date  . Lipoma excision      back  . Breast surgery      left breast lump--benign  . Cardiac catheterization  06/03/2005  . Coronary artery bypass graft  2007    x2    FAMHx:  Family History  Problem Relation Age of Onset  . Stroke  Mother   . Hypertension Mother   . Neuropathy Mother   . Stroke Father   . Hypertension Father   . Diabetes Father   . Cancer Sister 68    sarcoma  . Diabetes Brother   . Arthritis Sister   . Arthritis Sister   . Lupus Sister   . Hemachromatosis Sister   . Arthritis Sister   . Hemachromatosis Sister   . Hemachromatosis Sister   . Diverticulitis Sister   . Diabetes Brother   . Diabetes Brother   . Hemachromatosis Brother     SOCHx:   reports that she has never smoked. She has never used smokeless tobacco. She reports that she does not drink alcohol or use illicit drugs.  ALLERGIES:  Allergies    Allergen Reactions  . Crestor [Rosuvastatin]     Muscle aches on 5 mg daily and 10 mg twice weekly  . Lipitor [Atorvastatin]   . Livalo [Pitavastatin]   . Zetia [Ezetimibe]   . Zocor [Simvastatin]     Muscle aches   . Sulfa Antibiotics Rash    ROS: A comprehensive review of systems was negative except for: Musculoskeletal: positive for back pain and muscle weakness  HOME MEDS: Current Outpatient Prescriptions  Medication Sig Dispense Refill  . Alirocumab (PRALUENT) 75 MG/ML SOPN Inject 75 mg into the skin every 14 (fourteen) days. 6 pen 0  . allopurinol (ZYLOPRIM) 100 MG tablet Take 1 tablet (100 mg total) by mouth 2 (two) times daily. 60 tablet 1  . amLODipine (NORVASC) 2.5 MG tablet TAKE 1 TABLET (2.5 MG TOTAL) BY MOUTH DAILY. 30 tablet 4  . aspirin 81 MG tablet Take 81 mg by mouth daily.    . colchicine 0.6 MG tablet Take 0.6 mg by mouth as needed.    Marland Kitchen DIOVAN 160 MG tablet TAKE 1 TABLET EVERY DAY 30 tablet 5  . fluticasone (FLONASE) 50 MCG/ACT nasal spray Place 1 spray into the nose as needed.     Marland Kitchen glucose blood test strip Use as instructed 100 each 0  . LYRICA 50 MG capsule TAKE ONE CAPSULE BY MOUTH TWICE A DAY 60 capsule 0  . metoprolol tartrate (LOPRESSOR) 25 MG tablet Take 1 tablet (25 mg total) by mouth 2 (two) times daily. 60 tablet 4  . omega-3 acid ethyl esters (LOVAZA) 1 G capsule Take 2 capsules (2 g total) by mouth 2 (two) times daily. 360 capsule 3  . sitaGLIPtin (JANUVIA) 100 MG tablet Take 1 tablet (100 mg total) by mouth daily. 30 tablet 5  . triamterene-hydrochlorothiazide (DYAZIDE) 37.5-25 MG per capsule Take 1 each (1 capsule total) by mouth daily. 30 capsule 2  . VOLTAREN 1 % GEL Place onto the skin 4 (four) times daily as needed.  3   No current facility-administered medications for this visit.    LABS/IMAGING: No results found for this or any previous visit (from the past 48 hour(s)). No results found.  VITALS: BP 120/68 mmHg  Pulse 76  Ht 5\' 3"   (1.6 m)  Wt 195 lb 6.4 oz (88.633 kg)  BMI 34.62 kg/m2  EXAM: General appearance: alert and no distress Neck: no adenopathy, no carotid bruit, no JVD, supple, symmetrical, trachea midline and thyroid not enlarged, symmetric, no tenderness/mass/nodules Lungs: clear to auscultation bilaterally Heart: regular rate and rhythm, S1, S2 normal, no murmur, click, rub or gallop Abdomen: soft, non-tender; bowel sounds normal; no masses,  no organomegaly Extremities: extremities normal, atraumatic, no cyanosis or edema Pulses: 2+ and symmetric  Skin: Skin color, texture, turgor normal. No rashes or lesions Neurologic: Grossly normal  EKG: Sinus rhythm at 76, possible prior inferior infarct  ASSESSMENT: 1. Coronary artery bypass grafting x2 vessels with LIMA to LAD and SVG to PDA in 2007 2. Asymptomatic PVCs 3. Hypertension-controlled 4. Marked dyslipidemia - intolerant to all statins and zetia 5. Diabetes 6. Gout  PLAN: 1.   Ms. Ingham is doing fairly well, however remains at very high risk for coronary events based on her marked dyslipidemia. She is now on Pralulent, and we'll see how this affects her cholesterol profile. Her EKG does show some inferior Q waves which are small. This was present to some extent in the past. I do not feel that this is a new inferior event. She denies any new chest pain. Blood pressure is controlled. She reports no awareness of PVCs that she's having the past and they're not noted today. Plan to see her back annually or sooner as necessary and I'll continue to keep apprised on her cholesterol through her primary care provider.  Pixie Casino, MD, [facility] Attending Cardiologist [place] C Hilty 10/19/2014, 10:05 AM

## 2014-10-23 ENCOUNTER — Other Ambulatory Visit: Payer: Self-pay | Admitting: *Deleted

## 2014-10-23 MED ORDER — METOPROLOL TARTRATE 25 MG PO TABS: 25.0000 mg | ORAL_TABLET | Freq: Two times a day (BID) | ORAL | Status: DC

## 2014-10-24 ENCOUNTER — Other Ambulatory Visit: Payer: Self-pay

## 2014-10-24 MED ORDER — AMLODIPINE BESYLATE 2.5 MG PO TABS: ORAL_TABLET | ORAL | Status: DC

## 2014-10-30 DIAGNOSIS — L821 Other seborrheic keratosis: Secondary | ICD-10-CM | POA: Diagnosis not present

## 2014-10-30 DIAGNOSIS — Z85828 Personal history of other malignant neoplasm of skin: Secondary | ICD-10-CM | POA: Diagnosis not present

## 2014-10-30 DIAGNOSIS — C44519 Basal cell carcinoma of skin of other part of trunk: Secondary | ICD-10-CM | POA: Diagnosis not present

## 2014-10-30 DIAGNOSIS — L820 Inflamed seborrheic keratosis: Secondary | ICD-10-CM | POA: Diagnosis not present

## 2014-10-30 DIAGNOSIS — D2272 Melanocytic nevi of left lower limb, including hip: Secondary | ICD-10-CM | POA: Diagnosis not present

## 2014-11-06 ENCOUNTER — Other Ambulatory Visit: Payer: Self-pay

## 2014-11-06 MED ORDER — SITAGLIPTIN PHOSPHATE 100 MG PO TABS: 100.0000 mg | ORAL_TABLET | Freq: Every day | ORAL | Status: DC

## 2014-11-07 ENCOUNTER — Ambulatory Visit (HOSPITAL_BASED_OUTPATIENT_CLINIC_OR_DEPARTMENT_OTHER): Payer: Medicare Other | Admitting: Hematology & Oncology

## 2014-11-07 ENCOUNTER — Encounter: Payer: Self-pay | Admitting: Hematology & Oncology

## 2014-11-07 ENCOUNTER — Other Ambulatory Visit (HOSPITAL_BASED_OUTPATIENT_CLINIC_OR_DEPARTMENT_OTHER): Payer: Medicare Other

## 2014-11-07 LAB — CBC WITH DIFFERENTIAL (CANCER CENTER ONLY)
BASO#: 0 10*3/uL (ref 0.0–0.2)
BASO%: 0.4 % (ref 0.0–2.0)
EOS%: 3.9 % (ref 0.0–7.0)
Eosinophils Absolute: 0.3 10*3/uL (ref 0.0–0.5)
HCT: 42.4 % (ref 34.8–46.6)
HEMOGLOBIN: 14.4 g/dL (ref 11.6–15.9)
LYMPH#: 3.1 10*3/uL (ref 0.9–3.3)
LYMPH%: 37.7 % (ref 14.0–48.0)
MCH: 31.2 pg (ref 26.0–34.0)
MCHC: 34 g/dL (ref 32.0–36.0)
MCV: 92 fL (ref 81–101)
MONO#: 0.5 10*3/uL (ref 0.1–0.9)
MONO%: 5.9 % (ref 0.0–13.0)
NEUT%: 52.1 % (ref 39.6–80.0)
NEUTROS ABS: 4.3 10*3/uL (ref 1.5–6.5)
PLATELETS: 155 10*3/uL (ref 145–400)
RBC: 4.62 10*6/uL (ref 3.70–5.32)
RDW: 13.5 % (ref 11.1–15.7)
WBC: 8.3 10*3/uL (ref 3.9–10.0)

## 2014-11-07 NOTE — Progress Notes (Signed)
Hematology and Oncology Follow Up Visit  Carmen [name] [id number] Jun 20, 1940 74 y.o. 11/07/2014   Principle Diagnosis:  Hemochromatosis (double heterozygote for C282Y and S65C mutations).  Current Therapy:    Phlebotomy to maintain ferritin less than 100     Interim History:  Ms.  Carmen [name] is back for followup.she is doing okay. She was last phlebotomized in April. Her ferritin, at that time, was 138. Her iron saturation was 43%.  She feels well. She will has no complaints. She's had no nausea or vomiting. There's been no cough. She's had no nausea or vomiting. She's had no change in bowel or bladder habits. She's had no leg swelling. She's had no rashes.  Overall, her performance status is ECOG 1.  Medications:  Current outpatient prescriptions:  .  Alirocumab (PRALUENT) 75 MG/ML SOPN, Inject 75 mg into the skin every 14 (fourteen) days., Disp: 6 pen, Rfl: 0 .  allopurinol (ZYLOPRIM) 100 MG tablet, Take 1 tablet (100 mg total) by mouth 2 (two) times daily., Disp: 60 tablet, Rfl: 1 .  amLODipine (NORVASC) 2.5 MG tablet, TAKE 1 TABLET (2.5 MG TOTAL) BY MOUTH DAILY., Disp: 30 tablet, Rfl: 1 .  aspirin 81 MG tablet, Take 81 mg by mouth daily., Disp: , Rfl:  .  clindamycin (CLEOCIN) 150 MG capsule, Take 2 capsules by mouth., Disp: , Rfl: 1 .  colchicine 0.6 MG tablet, Take 0.6 mg by mouth as needed., Disp: , Rfl:  .  DIOVAN 160 MG tablet, TAKE 1 TABLET EVERY DAY, Disp: 30 tablet, Rfl: 5 .  fluticasone (FLONASE) 50 MCG/ACT nasal spray, Place 1 spray into the nose as needed. , Disp: , Rfl:  .  glucose blood test strip, Use as instructed, Disp: 100 each, Rfl: 0 .  LYRICA 50 MG capsule, TAKE ONE CAPSULE BY MOUTH TWICE A DAY, Disp: 60 capsule, Rfl: 0 .  metoprolol tartrate (LOPRESSOR) 25 MG tablet, Take 1 tablet (25 mg total) by mouth 2 (two) times daily., Disp: 60 tablet, Rfl: 1 .  omega-3 acid ethyl esters (LOVAZA) 1 G capsule, Take 2 capsules (2 g total) by mouth 2 (two) times daily.,  Disp: 360 capsule, Rfl: 3 .  sitaGLIPtin (JANUVIA) 100 MG tablet, Take 1 tablet (100 mg total) by mouth daily., Disp: 30 tablet, Rfl: 0 .  triamterene-hydrochlorothiazide (DYAZIDE) 37.5-25 MG per capsule, Take 1 each (1 capsule total) by mouth daily., Disp: 30 capsule, Rfl: 2 .  VOLTAREN 1 % GEL, Place onto the skin 4 (four) times daily as needed., Disp: , Rfl: 3  Allergies:  Allergies  Allergen Reactions  . Crestor [Rosuvastatin]     Muscle aches on 5 mg daily and 10 mg twice weekly  . Lipitor [Atorvastatin]   . Livalo [Pitavastatin]   . Zetia [Ezetimibe]   . Zocor [Simvastatin]     Muscle aches   . Sulfa Antibiotics Rash    Past Medical History, Surgical history, Social history, and Family History were reviewed and updated.  Review of Systems: As above  Physical Exam:  height is 5\' 3"  (1.6 m) and weight is 193 lb (87.544 kg). Her oral temperature is 97.7 F (36.5 C). Her blood pressure is 142/66 and her pulse is 62. Her respiration is 18.   Well developed and well nourished white female. Lungs are clear. Cardiac exam regular rate and rhythm with no murmurs, rubs or bruits.. Neck without adenopathy. Her thyroid is nonpalpable. Ocular exam is normal. Abdomen is soft. There is no palpable liver or spleen.  She has good bowel sounds. Back exam shows no tenderness over the spine, ribs or hips.. Extremities some slight chronic nonpitting edema. She has good strength in her extremity is. She has good range of motion of her joints. Skin exam no rashes, ecchymoses or petechia. Neurological exam nonfocal.  Lab Results  Component Value Date   WBC 8.3 11/07/2014   HGB 14.4 11/07/2014   HCT 42.4 11/07/2014   MCV 92 11/07/2014   PLT 155 11/07/2014     Chemistry      Component Value Date/Time   NA 138 07/11/2014 1014   NA 141 01/18/2014 1104   NA 141 06/20/2013 1503   K 3.9 07/11/2014 1014   K 4.1 01/18/2014 1104   CL 102 07/11/2014 1014   CL 96* 01/18/2014 1104   CO2 24 07/11/2014  1014   CO2 28 01/18/2014 1104   BUN 24* 07/11/2014 1014   BUN 21 01/18/2014 1104   BUN 22 06/20/2013 1503   CREATININE 1.28* 07/11/2014 1014   CREATININE 1.5* 01/18/2014 1104      Component Value Date/Time   CALCIUM 9.5 07/11/2014 1014   CALCIUM 10.0 01/18/2014 1104   ALKPHOS 64 07/11/2014 1014   ALKPHOS 68 01/18/2014 1104   AST 31 07/11/2014 1014   AST 29 01/18/2014 1104   ALT 35 07/11/2014 1014   ALT 34 01/18/2014 1104   BILITOT 0.5 07/11/2014 1014   BILITOT 0.70 01/18/2014 1104        Impression and Plan: Carmen [name] is 74 year old white female with hemachromatosis. She is a double heterozygote  We will see what her iron studies show. I would be surprised if she had a be phlebotomized but this is a possibility.  Again, I told her that her hemochromatosis is the least of her issues. She has a terrible cholesterol level and a terrible cholesterol/HDL ratio. I think that this will be a much bigger problem for her.  We will go ahead and plan to get her back in another 3 months.     Volanda Napoleon, MD 8/17/20161:32 PM

## 2014-11-08 ENCOUNTER — Encounter: Payer: Self-pay | Admitting: *Deleted

## 2014-11-08 LAB — COMPREHENSIVE METABOLIC PANEL
ALT: 29 U/L (ref 6–29)
AST: 28 U/L (ref 10–35)
Albumin: 4.4 g/dL (ref 3.6–5.1)
Alkaline Phosphatase: 68 U/L (ref 33–130)
BUN: 31 mg/dL — ABNORMAL HIGH (ref 7–25)
CALCIUM: 10 mg/dL (ref 8.6–10.4)
CO2: 29 mmol/L (ref 20–31)
Chloride: 101 mmol/L (ref 98–110)
Creatinine, Ser: 1.48 mg/dL — ABNORMAL HIGH (ref 0.60–0.93)
GLUCOSE: 240 mg/dL — AB (ref 65–99)
POTASSIUM: 4.8 mmol/L (ref 3.5–5.3)
Sodium: 142 mmol/L (ref 135–146)
Total Bilirubin: 0.4 mg/dL (ref 0.2–1.2)
Total Protein: 7.2 g/dL (ref 6.1–8.1)

## 2014-11-08 LAB — AFP TUMOR MARKER: AFP TUMOR MARKER: 4.5 ng/mL (ref <6.1)

## 2014-11-08 LAB — FERRITIN CHCC: Ferritin: 54 ng/ml (ref 9–269)

## 2014-11-08 LAB — IRON AND TIBC CHCC
%SAT: 33 % (ref 21–57)
Iron: 97 ug/dL (ref 41–142)
TIBC: 295 ug/dL (ref 236–444)
UIBC: 198 ug/dL (ref 120–384)

## 2014-11-16 ENCOUNTER — Telehealth: Payer: Self-pay | Admitting: Family Medicine

## 2014-11-16 ENCOUNTER — Other Ambulatory Visit: Payer: Self-pay | Admitting: Family Medicine

## 2014-11-16 MED ORDER — PREGABALIN 50 MG PO CAPS: 50.0000 mg | ORAL_CAPSULE | Freq: Two times a day (BID) | ORAL | Status: DC

## 2014-11-16 NOTE — Telephone Encounter (Signed)
See if Dr. Laurance Flatten will do

## 2014-11-16 NOTE — Telephone Encounter (Signed)
Pt aware to pick up

## 2014-11-27 ENCOUNTER — Other Ambulatory Visit: Payer: Self-pay | Admitting: *Deleted

## 2014-11-27 MED ORDER — ALLOPURINOL 100 MG PO TABS: 100.0000 mg | ORAL_TABLET | Freq: Two times a day (BID) | ORAL | Status: DC

## 2014-12-05 ENCOUNTER — Other Ambulatory Visit: Payer: Self-pay

## 2014-12-05 MED ORDER — SITAGLIPTIN PHOSPHATE 100 MG PO TABS: 100.0000 mg | ORAL_TABLET | Freq: Every day | ORAL | Status: DC

## 2014-12-13 ENCOUNTER — Other Ambulatory Visit: Payer: Self-pay | Admitting: Family

## 2014-12-13 NOTE — Telephone Encounter (Signed)
Last seen 05/22/14  Dr Sabra Heck  If approved route to nurse [facility]

## 2014-12-19 ENCOUNTER — Other Ambulatory Visit: Payer: Self-pay

## 2014-12-19 MED ORDER — METOPROLOL TARTRATE 25 MG PO TABS: 25.0000 mg | ORAL_TABLET | Freq: Two times a day (BID) | ORAL | Status: DC

## 2014-12-19 MED ORDER — AMLODIPINE BESYLATE 2.5 MG PO TABS: ORAL_TABLET | ORAL | Status: DC

## 2014-12-25 ENCOUNTER — Telehealth: Payer: Self-pay | Admitting: Family Medicine

## 2014-12-25 NOTE — Telephone Encounter (Signed)
Appt made with Dr. Sabra Heck

## 2014-12-28 ENCOUNTER — Other Ambulatory Visit: Payer: Self-pay | Admitting: Pharmacist

## 2014-12-28 MED ORDER — ALIROCUMAB 75 MG/ML ~~LOC~~ SOPN: 75.0000 mg | PEN_INJECTOR | SUBCUTANEOUS | Status: DC

## 2015-01-01 ENCOUNTER — Encounter: Payer: Self-pay | Admitting: Family Medicine

## 2015-01-01 ENCOUNTER — Ambulatory Visit (INDEPENDENT_AMBULATORY_CARE_PROVIDER_SITE_OTHER): Payer: Medicare Other | Admitting: Family Medicine

## 2015-01-01 VITALS — BP 138/74 | HR 59 | Temp 97.3°F | Ht 63.0 in | Wt 193.0 lb

## 2015-01-01 DIAGNOSIS — R399 Unspecified symptoms and signs involving the genitourinary system: Secondary | ICD-10-CM | POA: Diagnosis not present

## 2015-01-01 DIAGNOSIS — M1000 Idiopathic gout, unspecified site: Secondary | ICD-10-CM

## 2015-01-01 DIAGNOSIS — E1140 Type 2 diabetes mellitus with diabetic neuropathy, unspecified: Secondary | ICD-10-CM

## 2015-01-01 DIAGNOSIS — G709 Myoneural disorder, unspecified: Secondary | ICD-10-CM | POA: Diagnosis not present

## 2015-01-01 DIAGNOSIS — Z23 Encounter for immunization: Secondary | ICD-10-CM | POA: Diagnosis not present

## 2015-01-01 DIAGNOSIS — I10 Essential (primary) hypertension: Secondary | ICD-10-CM | POA: Diagnosis not present

## 2015-01-01 DIAGNOSIS — E785 Hyperlipidemia, unspecified: Secondary | ICD-10-CM

## 2015-01-01 LAB — POCT URINALYSIS DIPSTICK
Bilirubin, UA: NEGATIVE
GLUCOSE UA: NEGATIVE
Ketones, UA: NEGATIVE
Nitrite, UA: NEGATIVE
PROTEIN UA: NEGATIVE
RBC UA: NEGATIVE
SPEC GRAV UA: 1.015
UROBILINOGEN UA: NEGATIVE
pH, UA: 5

## 2015-01-01 LAB — POCT UA - MICROSCOPIC ONLY
CASTS, UR, LPF, POC: NEGATIVE
CRYSTALS, UR, HPF, POC: NEGATIVE
Mucus, UA: NEGATIVE
Yeast, UA: NEGATIVE

## 2015-01-01 LAB — POCT GLYCOSYLATED HEMOGLOBIN (HGB A1C): HEMOGLOBIN A1C: 6.9

## 2015-01-01 MED ORDER — ALLOPURINOL 100 MG PO TABS: 100.0000 mg | ORAL_TABLET | Freq: Two times a day (BID) | ORAL | Status: DC

## 2015-01-01 MED ORDER — AMLODIPINE BESYLATE 2.5 MG PO TABS: ORAL_TABLET | ORAL | Status: DC

## 2015-01-01 MED ORDER — SITAGLIPTIN PHOSPHATE 100 MG PO TABS: 100.0000 mg | ORAL_TABLET | Freq: Every day | ORAL | Status: DC

## 2015-01-01 MED ORDER — VALSARTAN 160 MG PO TABS: 160.0000 mg | ORAL_TABLET | Freq: Every day | ORAL | Status: DC

## 2015-01-01 MED ORDER — PREGABALIN 50 MG PO CAPS
50.0000 mg | ORAL_CAPSULE | Freq: Two times a day (BID) | ORAL | Status: DC
Start: 2015-01-01 — End: 2015-04-06

## 2015-01-01 MED ORDER — TRIAMTERENE-HCTZ 37.5-25 MG PO CAPS: 1.0000 | ORAL_CAPSULE | Freq: Every day | ORAL | Status: DC

## 2015-01-01 MED ORDER — ALIROCUMAB 75 MG/ML ~~LOC~~ SOPN: 75.0000 mg | PEN_INJECTOR | SUBCUTANEOUS | Status: DC

## 2015-01-01 MED ORDER — METOPROLOL TARTRATE 25 MG PO TABS: 25.0000 mg | ORAL_TABLET | Freq: Two times a day (BID) | ORAL | Status: DC

## 2015-01-01 NOTE — Progress Notes (Signed)
Subjective:    Patient ID: Carmen [name], female    DOB: 12-25-1940, 74 y.o.   MRN: [id number]  HPI 74 year old female here to follow-up hyperlipidemia, diabetes, hypertension, and arthritis. She was just started on PK S9 drug as she was statin intolerant having LDLs of 180 and known heart disease with cardiac bypass grafting. She is tolerating the medicine. She gives herself injections every 2 weeks. This will be the first time we have checked her lipids since initiation. Regarding diabetes she only takes Januvia and A1c's of always been at goal. Blood pressures are controlled with amlodipine and metoprolol and valsartan. She has been seen rheumatologist for her arthritic complaints using only Voltaren gel. She has no plans to go back to rheumatology and was requesting a refill of that medicine today.    Review of Systems  Constitutional: Negative.   Respiratory: Negative.   Cardiovascular: Negative.   Genitourinary: Negative.   Neurological: Negative.   Psychiatric/Behavioral: Negative.          Patient Active Problem List   Diagnosis Date Noted  . Statin intolerance 08/18/2013  . Neuromuscular disorder ([place])   . Hemochromatosis, hereditary ([place]) 11/24/2012  . Pain 10/20/2012  . S/P CABG x 2 08/09/2012  . PVC's (premature ventricular contractions) 08/09/2012  . Gout 06/20/2012  . HTN (hypertension) 06/20/2012  . HLD (hyperlipidemia) 06/20/2012  . CAD (coronary artery disease) 06/20/2012  . DM (diabetes mellitus) type II controlled, neurological manifestation ([place]) 06/20/2012  . Arthritis 06/20/2012   Outpatient Encounter Prescriptions as of 01/01/2015  Medication Sig  . Alirocumab (PRALUENT) 75 MG/ML SOPN Inject 75 mg into the skin every 14 (fourteen) days.  Marland Kitchen allopurinol (ZYLOPRIM) 100 MG tablet Take 1 tablet (100 mg total) by mouth 2 (two) times daily.  Marland Kitchen amLODipine (NORVASC) 2.5 MG tablet TAKE 1 TABLET (2.5 MG TOTAL) BY MOUTH DAILY.  Marland Kitchen aspirin 81 MG tablet Take  81 mg by mouth daily.  . colchicine 0.6 MG tablet Take 0.6 mg by mouth as needed.  Marland Kitchen DIOVAN 160 MG tablet TAKE 1 TABLET EVERY DAY  . fluticasone (FLONASE) 50 MCG/ACT nasal spray Place 1 spray into the nose as needed.   Marland Kitchen glucose blood test strip Use as instructed  . metoprolol tartrate (LOPRESSOR) 25 MG tablet Take 1 tablet (25 mg total) by mouth 2 (two) times daily.  Marland Kitchen omega-3 acid ethyl esters (LOVAZA) 1 G capsule Take 2 capsules (2 g total) by mouth 2 (two) times daily.  . pregabalin (LYRICA) 50 MG capsule Take 1 capsule (50 mg total) by mouth 2 (two) times daily.  . sitaGLIPtin (JANUVIA) 100 MG tablet Take 1 tablet (100 mg total) by mouth daily.  Marland Kitchen triamterene-hydrochlorothiazide (DYAZIDE) 37.5-25 MG per capsule Take 1 each (1 capsule total) by mouth daily.  . VOLTAREN 1 % GEL Place onto the skin 4 (four) times daily as needed.  . [DISCONTINUED] LYRICA 50 MG capsule TAKE ONE CAPSULE BY MOUTH TWICE A DAY  . [DISCONTINUED] clindamycin (CLEOCIN) 150 MG capsule Take 2 capsules by mouth.  . [DISCONTINUED] pregabalin (LYRICA) 50 MG capsule Take 1 capsule (50 mg total) by mouth 2 (two) times daily.   No facility-administered encounter medications on file as of 01/01/2015.     Objective:   Physical Exam  Constitutional: She is oriented to person, place, and time. She appears well-nourished.  Cardiovascular: Normal rate, regular rhythm, normal heart sounds and intact distal pulses.   Pulmonary/Chest: Effort normal.  Abdominal: Soft. Bowel sounds are normal.  Genitourinary:  It's pelvic exams at GYN and mammograms at breast Center  Musculoskeletal: Normal range of motion.  Neurological: She is alert and oriented to person, place, and time.          Assessment & Plan:  1. Essential hypertension Pressure controlled on current regimen. No changes indicated  2. Controlled type 2 diabetes mellitus with diabetic neuropathy, without long-term current use of insulin ([place]) Diabetes is one  well controlled on one drug regimen of Januvia. Plan to check A1c today  3. HLD (hyperlipidemia) Lipids are big issue with this patient will see how she is doing on the PK S9 inhibitor  4. Idiopathic gout, unspecified chronicity, unspecified site No recent attacks continue with allopurinol  5. Hemochromatosis, hereditary ([place]) This problem is followed by hematology  6. Neuromuscular disorder ([place]) Intervals are well managed with Lyrica even though she does have some dependent edema that's probably a side effect Wardell Honour MD

## 2015-01-01 NOTE — Addendum Note (Signed)
Addended by: Keiara Plater on: 01/01/2015 03:37 PM   Modules accepted: Orders, SmartSet

## 2015-01-02 LAB — CMP14+EGFR
A/G RATIO: 1.6 (ref 1.1–2.5)
ALK PHOS: 76 IU/L (ref 39–117)
ALT: 33 IU/L — ABNORMAL HIGH (ref 0–32)
AST: 32 IU/L (ref 0–40)
Albumin: 4.6 g/dL (ref 3.5–4.8)
BUN/Creatinine Ratio: 16 (ref 11–26)
BUN: 21 mg/dL (ref 8–27)
Bilirubin Total: 0.4 mg/dL (ref 0.0–1.2)
CHLORIDE: 98 mmol/L (ref 97–108)
CO2: 23 mmol/L (ref 18–29)
Calcium: 10.2 mg/dL (ref 8.7–10.3)
Creatinine, Ser: 1.32 mg/dL — ABNORMAL HIGH (ref 0.57–1.00)
GFR calc Af Amer: 46 mL/min/{1.73_m2} — ABNORMAL LOW (ref >59)
GFR calc non Af Amer: 40 mL/min/{1.73_m2} — ABNORMAL LOW (ref >59)
GLOBULIN, TOTAL: 2.8 g/dL (ref 1.5–4.5)
Glucose: 174 mg/dL — ABNORMAL HIGH (ref 65–99)
POTASSIUM: 4.4 mmol/L (ref 3.5–5.2)
SODIUM: 140 mmol/L (ref 134–144)
Total Protein: 7.4 g/dL (ref 6.0–8.5)

## 2015-01-02 LAB — LIPID PANEL
CHOL/HDL RATIO: 4.8 ratio — AB (ref 0.0–4.4)
CHOLESTEROL TOTAL: 171 mg/dL (ref 100–199)
HDL: 36 mg/dL — ABNORMAL LOW (ref >39)
LDL CALC: 71 mg/dL (ref 0–99)
TRIGLYCERIDES: 319 mg/dL — AB (ref 0–149)
VLDL CHOLESTEROL CAL: 64 mg/dL — AB (ref 5–40)

## 2015-01-03 LAB — URINE CULTURE

## 2015-01-09 MED ORDER — NITROFURANTOIN MONOHYD MACRO 100 MG PO CAPS: 100.0000 mg | ORAL_CAPSULE | Freq: Two times a day (BID) | ORAL | Status: DC

## 2015-01-09 NOTE — Addendum Note (Signed)
Addended by: Thana Ates on: 01/09/2015 06:36 PM   Modules accepted: Orders

## 2015-02-06 ENCOUNTER — Other Ambulatory Visit (HOSPITAL_BASED_OUTPATIENT_CLINIC_OR_DEPARTMENT_OTHER): Payer: Medicare Other

## 2015-02-06 ENCOUNTER — Encounter: Payer: Self-pay | Admitting: Hematology & Oncology

## 2015-02-06 ENCOUNTER — Ambulatory Visit (HOSPITAL_BASED_OUTPATIENT_CLINIC_OR_DEPARTMENT_OTHER): Payer: Medicare Other | Admitting: Hematology & Oncology

## 2015-02-06 LAB — COMPREHENSIVE METABOLIC PANEL (CC13)
ALT: 30 U/L (ref 0–55)
ANION GAP: 10 meq/L (ref 3–11)
AST: 28 U/L (ref 5–34)
Albumin: 3.7 g/dL (ref 3.5–5.0)
Alkaline Phosphatase: 72 U/L (ref 40–150)
BUN: 20 mg/dL (ref 7.0–26.0)
CHLORIDE: 103 meq/L (ref 98–109)
CO2: 27 meq/L (ref 22–29)
Calcium: 10 mg/dL (ref 8.4–10.4)
Creatinine: 1.4 mg/dL — ABNORMAL HIGH (ref 0.6–1.1)
EGFR: 37 mL/min/{1.73_m2} — AB (ref >90)
GLUCOSE: 142 mg/dL — AB (ref 70–140)
POTASSIUM: 4.3 meq/L (ref 3.5–5.1)
SODIUM: 140 meq/L (ref 136–145)
TOTAL PROTEIN: 7.3 g/dL (ref 6.4–8.3)
Total Bilirubin: 0.47 mg/dL (ref 0.20–1.20)

## 2015-02-06 LAB — CBC WITH DIFFERENTIAL (CANCER CENTER ONLY)
BASO#: 0 10*3/uL (ref 0.0–0.2)
BASO%: 0.2 % (ref 0.0–2.0)
EOS%: 2.7 % (ref 0.0–7.0)
Eosinophils Absolute: 0.3 10*3/uL (ref 0.0–0.5)
HEMATOCRIT: 40.7 % (ref 34.8–46.6)
HEMOGLOBIN: 13.7 g/dL (ref 11.6–15.9)
LYMPH#: 3.9 10*3/uL — AB (ref 0.9–3.3)
LYMPH%: 37.3 % (ref 14.0–48.0)
MCH: 30.8 pg (ref 26.0–34.0)
MCHC: 33.7 g/dL (ref 32.0–36.0)
MCV: 92 fL (ref 81–101)
MONO#: 0.6 10*3/uL (ref 0.1–0.9)
MONO%: 6.1 % (ref 0.0–13.0)
NEUT%: 53.7 % (ref 39.6–80.0)
NEUTROS ABS: 5.6 10*3/uL (ref 1.5–6.5)
Platelets: 160 10*3/uL (ref 145–400)
RBC: 4.45 10*6/uL (ref 3.70–5.32)
RDW: 13.7 % (ref 11.1–15.7)
WBC: 10.4 10*3/uL — ABNORMAL HIGH (ref 3.9–10.0)

## 2015-02-06 LAB — IRON AND TIBC CHCC
%SAT: 40 % (ref 21–57)
IRON: 103 ug/dL (ref 41–142)
TIBC: 261 ug/dL (ref 236–444)
UIBC: 158 ug/dL (ref 120–384)

## 2015-02-06 LAB — CHCC SATELLITE - SMEAR

## 2015-02-06 LAB — FERRITIN CHCC: FERRITIN: 91 ng/mL (ref 9–269)

## 2015-02-06 NOTE — Progress Notes (Signed)
Hematology and Oncology Follow Up Visit  Carmen [name] [id number] [birth year] 74 y.o. 02/06/2015   Principle Diagnosis:  Hemochromatosis (double heterozygote for C282Y and S65C mutations).  Current Therapy:    Phlebotomy to maintain ferritin less than 100     Interim History:  Ms.  Carmen [name] is back for followup. She is doing okay. She was last phlebotomized in April.   We last saw her in August, her ferritin was 54 with iron saturation of 33%.  She and her husband will not be doing too much for Thanksgiving. They will likely be going on a cruise next year.  As always, we talked about college basketball. There being fans of [place].  Overall, her performance status is ECOG 1.  Medications:  Current outpatient prescriptions:  .  Alirocumab (PRALUENT) 75 MG/ML SOPN, Inject 75 mg into the skin every 14 (fourteen) days., Disp: 12 pen, Rfl: 0 .  allopurinol (ZYLOPRIM) 100 MG tablet, Take 1 tablet (100 mg total) by mouth 2 (two) times daily., Disp: 60 tablet, Rfl: 3 .  amLODipine (NORVASC) 2.5 MG tablet, TAKE 1 TABLET (2.5 MG TOTAL) BY MOUTH DAILY., Disp: 30 tablet, Rfl: 3 .  aspirin 81 MG tablet, Take 81 mg by mouth daily., Disp: , Rfl:  .  colchicine 0.6 MG tablet, Take 0.6 mg by mouth as needed., Disp: , Rfl:  .  fluticasone (FLONASE) 50 MCG/ACT nasal spray, Place 1 spray into the nose as needed. , Disp: , Rfl:  .  glucose blood test strip, Use as instructed, Disp: 100 each, Rfl: 0 .  metoprolol tartrate (LOPRESSOR) 25 MG tablet, Take 1 tablet (25 mg total) by mouth 2 (two) times daily., Disp: 60 tablet, Rfl: 3 .  omega-3 acid ethyl esters (LOVAZA) 1 G capsule, Take 2 capsules (2 g total) by mouth 2 (two) times daily., Disp: 360 capsule, Rfl: 3 .  pregabalin (LYRICA) 50 MG capsule, Take 1 capsule (50 mg total) by mouth 2 (two) times daily., Disp: 60 capsule, Rfl: 3 .  sitaGLIPtin (JANUVIA) 100 MG tablet, Take 1 tablet (100 mg total) by mouth daily., Disp: 30 tablet, Rfl: 3 .   triamterene-hydrochlorothiazide (DYAZIDE) 37.5-25 MG capsule, Take 1 each (1 capsule total) by mouth daily., Disp: 30 capsule, Rfl: 3 .  valsartan (DIOVAN) 160 MG tablet, Take 1 tablet (160 mg total) by mouth daily., Disp: 30 tablet, Rfl: 3 .  VOLTAREN 1 % GEL, Place onto the skin 4 (four) times daily as needed., Disp: , Rfl: 3  Allergies:  Allergies  Allergen Reactions  . Crestor [Rosuvastatin]     Muscle aches on 5 mg daily and 10 mg twice weekly  . Lipitor [Atorvastatin]   . Livalo [Pitavastatin]   . Zetia [Ezetimibe]   . Zocor [Simvastatin]     Muscle aches   . Sulfa Antibiotics Rash    Past Medical History, Surgical history, Social history, and Family History were reviewed and updated.  Review of Systems: As above  Physical Exam:  oral temperature is 97.5 F (36.4 C). Her blood pressure is 146/65 and her pulse is 58.   Well developed and well nourished white female. Lungs are clear. Cardiac exam regular rate and rhythm with no murmurs, rubs or bruits.. Neck without adenopathy. Her thyroid is nonpalpable. Ocular exam is normal. Abdomen is soft. There is no palpable liver or spleen. She has good bowel sounds. Back exam shows no tenderness over the spine, ribs or hips.. Extremities some slight chronic nonpitting edema. She has good strength  in her extremity is. She has good range of motion of her joints. Skin exam no rashes, ecchymoses or petechia. Neurological exam nonfocal.  Lab Results  Component Value Date   WBC 10.4* 02/06/2015   HGB 13.7 02/06/2015   HCT 40.7 02/06/2015   MCV 92 02/06/2015   PLT 160 02/06/2015     Chemistry      Component Value Date/Time   NA 140 01/01/2015 1020   NA 142 11/07/2014 1213   NA 141 01/18/2014 1104   K 4.4 01/01/2015 1020   K 4.1 01/18/2014 1104   CL 98 01/01/2015 1020   CL 96* 01/18/2014 1104   CO2 23 01/01/2015 1020   CO2 28 01/18/2014 1104   BUN 21 01/01/2015 1020   BUN 31* 11/07/2014 1213   BUN 21 01/18/2014 1104    CREATININE 1.32* 01/01/2015 1020   CREATININE 1.5* 01/18/2014 1104      Component Value Date/Time   CALCIUM 10.2 01/01/2015 1020   CALCIUM 10.0 01/18/2014 1104   ALKPHOS 76 01/01/2015 1020   ALKPHOS 68 01/18/2014 1104   AST 32 01/01/2015 1020   AST 29 01/18/2014 1104   ALT 33* 01/01/2015 1020   ALT 34 01/18/2014 1104   BILITOT 0.4 01/01/2015 1020   BILITOT 0.4 11/07/2014 1213   BILITOT 0.70 01/18/2014 1104        Impression and Plan: [name] is 74 year old white female with hemachromatosis. She is a double heterozygote  We will see what her iron studies show. I would be surprised if she had a be phlebotomized but this is a possibility.  We will go ahead and plan to get her back in another 3 months.     Volanda Napoleon, MD 11/16/20161:25 PM

## 2015-02-07 ENCOUNTER — Telehealth: Payer: Self-pay

## 2015-02-07 NOTE — Telephone Encounter (Addendum)
-----   Message from Volanda Napoleon, MD sent at 02/06/2015  6:23 PM EST ----- Please call and tell her that the ferritin is 91. I think she needs to be phlebotomized. This way, we can get her through the holidays and wintertime. Thanks.   Patient called and told about her ferritin level is 91 and she need to came in to be Phlebotomized. Patient state she will call us after Thanksgivings to make that appt.  Kandice Moos, RN

## 2015-02-13 ENCOUNTER — Telehealth: Payer: Self-pay | Admitting: Family Medicine

## 2015-02-13 NOTE — Telephone Encounter (Signed)
Pharmacy changed to [facility]

## 2015-02-25 ENCOUNTER — Telehealth: Payer: Self-pay | Admitting: Family Medicine

## 2015-02-27 ENCOUNTER — Telehealth: Payer: Self-pay | Admitting: Family Medicine

## 2015-02-27 NOTE — Telephone Encounter (Signed)
[facility] faxed the labs. Hiram Comber

## 2015-02-27 NOTE — Telephone Encounter (Signed)
[facility] faxed the labs. Carmen [name]

## 2015-02-28 NOTE — Telephone Encounter (Signed)
Hardly anyone wants to cervical on jury duty me included. I'm not sure which of her problems or symptoms we could use though that would legitimately disqualify her from serving

## 2015-02-28 NOTE — Telephone Encounter (Signed)
She can have a note stating she has diabetes and arthritis but again Sunday lots of people have these chronic diseases who serve on jury

## 2015-02-28 NOTE — Telephone Encounter (Signed)
Stp and she would like a note stating that she has diabetes and arthritis. Can we do this?

## 2015-02-28 NOTE — Telephone Encounter (Signed)
Stp and advised letter is ready for pick up.

## 2015-03-01 ENCOUNTER — Ambulatory Visit (HOSPITAL_BASED_OUTPATIENT_CLINIC_OR_DEPARTMENT_OTHER): Payer: Medicare Other

## 2015-03-01 MED ORDER — SODIUM CHLORIDE 0.9 % IV SOLN
Freq: Once | INTRAVENOUS | Status: AC
  Administered 2015-03-01: 14:00:00 via INTRAVENOUS

## 2015-03-01 NOTE — Progress Notes (Signed)
Carmen [name] presents today for phlebotomy per MD orders. Phlebotomy procedure started at 1400 and ended at 1415. 500 grams removed. Patient observed for 30 minutes after procedure without any incident. Patient tolerated procedure well. IV needle removed intact.

## 2015-03-01 NOTE — Patient Instructions (Signed)

## 2015-03-07 ENCOUNTER — Telehealth: Payer: Self-pay | Admitting: Family Medicine

## 2015-03-07 NOTE — Telephone Encounter (Signed)
Stp and advised letter ready.

## 2015-03-12 ENCOUNTER — Telehealth: Payer: Self-pay

## 2015-03-12 NOTE — Telephone Encounter (Signed)
Insurance approved prior authorization for Praluent through 02/22/16   APP [id number]

## 2015-03-21 ENCOUNTER — Other Ambulatory Visit: Payer: Self-pay

## 2015-03-21 MED ORDER — FLUTICASONE PROPIONATE 50 MCG/ACT NA SUSP
1.0000 | NASAL | Status: DC | PRN
Start: 2015-03-21 — End: 2015-07-05

## 2015-03-21 MED ORDER — OMEGA-3-ACID ETHYL ESTERS 1 G PO CAPS: 2.0000 | ORAL_CAPSULE | Freq: Two times a day (BID) | ORAL | Status: DC

## 2015-03-21 MED ORDER — COLCHICINE 0.6 MG PO TABS: 0.6000 mg | ORAL_TABLET | ORAL | Status: DC | PRN

## 2015-03-21 MED ORDER — VOLTAREN 1 % TD GEL: 4.0000 g | Freq: Four times a day (QID) | TRANSDERMAL | Status: DC | PRN

## 2015-03-21 MED ORDER — GLUCOSE BLOOD VI STRP: ORAL_STRIP | Status: DC

## 2015-04-06 ENCOUNTER — Other Ambulatory Visit: Payer: Self-pay | Admitting: Family Medicine

## 2015-04-08 NOTE — Telephone Encounter (Signed)
[name] pt. Last filled 03/07/15, last seen 01/01/15. Call in at [facility]

## 2015-05-06 ENCOUNTER — Other Ambulatory Visit: Payer: Self-pay | Admitting: Family Medicine

## 2015-05-06 NOTE — Telephone Encounter (Signed)
Last seen 01/01/15  Dr Sabra Heck  If approved print

## 2015-05-07 MED ORDER — PREGABALIN 50 MG PO CAPS: ORAL_CAPSULE | ORAL | Status: DC

## 2015-05-07 NOTE — Telephone Encounter (Signed)
Pt aware rx ready to be picked up, rx put up front

## 2015-05-07 NOTE — Addendum Note (Signed)
Addended by: Nigel Berthold C on: 05/07/2015 12:02 PM   Modules accepted: Orders

## 2015-05-09 ENCOUNTER — Ambulatory Visit (HOSPITAL_BASED_OUTPATIENT_CLINIC_OR_DEPARTMENT_OTHER): Payer: Medicare Other | Admitting: Hematology & Oncology

## 2015-05-09 ENCOUNTER — Other Ambulatory Visit (HOSPITAL_BASED_OUTPATIENT_CLINIC_OR_DEPARTMENT_OTHER): Payer: Medicare Other

## 2015-05-09 ENCOUNTER — Ambulatory Visit: Payer: Medicare Other

## 2015-05-09 ENCOUNTER — Encounter: Payer: Self-pay | Admitting: Hematology & Oncology

## 2015-05-09 LAB — IRON AND TIBC
%SAT: 29 % (ref 21–57)
IRON: 79 ug/dL (ref 41–142)
TIBC: 273 ug/dL (ref 236–444)
UIBC: 193 ug/dL (ref 120–384)

## 2015-05-09 LAB — CBC WITH DIFFERENTIAL (CANCER CENTER ONLY)
BASO#: 0 10*3/uL (ref 0.0–0.2)
BASO%: 0.2 % (ref 0.0–2.0)
EOS ABS: 0.4 10*3/uL (ref 0.0–0.5)
EOS%: 3.7 % (ref 0.0–7.0)
HEMATOCRIT: 42 % (ref 34.8–46.6)
HGB: 14.1 g/dL (ref 11.6–15.9)
LYMPH#: 3.5 10*3/uL — AB (ref 0.9–3.3)
LYMPH%: 36.8 % (ref 14.0–48.0)
MCH: 30.6 pg (ref 26.0–34.0)
MCHC: 33.6 g/dL (ref 32.0–36.0)
MCV: 91 fL (ref 81–101)
MONO#: 0.6 10*3/uL (ref 0.1–0.9)
MONO%: 6.1 % (ref 0.0–13.0)
NEUT#: 5.1 10*3/uL (ref 1.5–6.5)
NEUT%: 53.2 % (ref 39.6–80.0)
Platelets: 160 10*3/uL (ref 145–400)
RBC: 4.61 10*6/uL (ref 3.70–5.32)
RDW: 13.3 % (ref 11.1–15.7)
WBC: 9.6 10*3/uL (ref 3.9–10.0)

## 2015-05-09 LAB — COMPREHENSIVE METABOLIC PANEL
ALBUMIN: 3.8 g/dL (ref 3.5–5.0)
ALK PHOS: 77 U/L (ref 40–150)
ALT: 28 U/L (ref 0–55)
ANION GAP: 11 meq/L (ref 3–11)
AST: 27 U/L (ref 5–34)
BUN: 23.4 mg/dL (ref 7.0–26.0)
CALCIUM: 10.2 mg/dL (ref 8.4–10.4)
CHLORIDE: 103 meq/L (ref 98–109)
CO2: 27 mEq/L (ref 22–29)
CREATININE: 1.6 mg/dL — AB (ref 0.6–1.1)
EGFR: 32 mL/min/{1.73_m2} — ABNORMAL LOW (ref >90)
Glucose: 195 mg/dl — ABNORMAL HIGH (ref 70–140)
POTASSIUM: 4.2 meq/L (ref 3.5–5.1)
Sodium: 141 mEq/L (ref 136–145)
Total Bilirubin: 0.45 mg/dL (ref 0.20–1.20)
Total Protein: 7.8 g/dL (ref 6.4–8.3)

## 2015-05-09 LAB — FERRITIN: FERRITIN: 61 ng/mL (ref 9–269)

## 2015-05-09 NOTE — Progress Notes (Signed)
No Phlebotomy need today per Dr. Marin Olp.

## 2015-05-09 NOTE — Progress Notes (Signed)
Hematology and Oncology Follow Up Visit  Carmen [name] [id number] 16-Jun-1940 75 y.o. 05/09/2015   Principle Diagnosis:  Hemochromatosis (double heterozygote for C282Y and S65C mutations).  Current Therapy:    Phlebotomy to maintain ferritin less than 100     Interim History:  [name] is back for followup. She is doing okay. She was last phlebotomized in November. The point time, her ferritin was 91. Other that she probably could be phlebotomized.   She had a good Christmas. As always, we talked about basketball.  She and her husband probably will be going to [place] for the  [facility] tournament.  She's had no fever. She's had no cough. She's had some slight swelling of her legs.  She does have diabetes. She is not on insulin for this.   Overall, her performance status is ECOG 1.  Medications:  Current outpatient prescriptions:  .  Alirocumab (PRALUENT) 75 MG/ML SOPN, Inject 75 mg into the skin every 14 (fourteen) days., Disp: 12 pen, Rfl: 0 .  allopurinol (ZYLOPRIM) 100 MG tablet, Take 1 tablet (100 mg total) by mouth 2 (two) times daily., Disp: 60 tablet, Rfl: 3 .  amLODipine (NORVASC) 2.5 MG tablet, TAKE 1 TABLET (2.5 MG TOTAL) BY MOUTH DAILY., Disp: 30 tablet, Rfl: 3 .  aspirin 81 MG tablet, Take 81 mg by mouth daily., Disp: , Rfl:  .  colchicine 0.6 MG tablet, TAKE (1) TABLET DAILY AS NEEDED., Disp: 30 tablet, Rfl: 1 .  fluticasone (FLONASE) 50 MCG/ACT nasal spray, Place 1 spray into both nostrils as needed., Disp: 16 g, Rfl: 3 .  glucose blood test strip, Use as instructed, Disp: 100 each, Rfl: 1 .  JANUVIA 100 MG tablet, TAKE 1 TABLET DAILY, Disp: 30 tablet, Rfl: 0 .  metoprolol tartrate (LOPRESSOR) 25 MG tablet, Take 1 tablet (25 mg total) by mouth 2 (two) times daily., Disp: 60 tablet, Rfl: 3 .  omega-3 acid ethyl esters (LOVAZA) 1 g capsule, Take 2 capsules (2 g total) by mouth 2 (two) times daily., Disp: 360 capsule, Rfl: 2 .  pregabalin (LYRICA) 50 MG capsule,  TAKE  (1)  CAPSULE  TWICE DAILY., Disp: 60 capsule, Rfl: 0 .  triamterene-hydrochlorothiazide (DYAZIDE) 37.5-25 MG capsule, Take 1 each (1 capsule total) by mouth daily., Disp: 30 capsule, Rfl: 3 .  valsartan (DIOVAN) 160 MG tablet, TAKE 1 TABLET DAILY, Disp: 30 tablet, Rfl: 1 .  VOLTAREN 1 % GEL, Apply 4 g topically 4 (four) times daily as needed., Disp: 100 g, Rfl: 2  Allergies:  Allergies  Allergen Reactions  . Crestor [Rosuvastatin]     Muscle aches on 5 mg daily and 10 mg twice weekly  . Lipitor [Atorvastatin]   . Livalo [Pitavastatin]   . Zetia [Ezetimibe]   . Zocor [Simvastatin]     Muscle aches   . Sulfa Antibiotics Rash    Past Medical History, Surgical history, Social history, and Family History were reviewed and updated.  Review of Systems: As above  Physical Exam:  height is 5\' 3"  (1.6 m) and weight is 193 lb (87.544 kg). Her oral temperature is 97.9 F (36.6 C). Her blood pressure is 147/68 and her pulse is 64. Her respiration is 16.   Well developed and well nourished white female. Lungs are clear. Cardiac exam regular rate and rhythm with no murmurs, rubs or bruits.. Neck without adenopathy. Her thyroid is nonpalpable. Ocular exam is normal. Abdomen is soft. There is no palpable liver or spleen. She has  good bowel sounds. Back exam shows no tenderness over the spine, ribs or hips.. Extremities some slight chronic nonpitting edema. She has good strength in her extremity is. She has good range of motion of her joints. Skin exam no rashes, ecchymoses or petechia. Neurological exam nonfocal.  Lab Results  Component Value Date   WBC 9.6 05/09/2015   HGB 14.1 05/09/2015   HCT 42.0 05/09/2015   MCV 91 05/09/2015   PLT 160 05/09/2015     Chemistry      Component Value Date/Time   NA 140 02/06/2015 1231   NA 140 01/01/2015 1020   NA 142 11/07/2014 1213   NA 141 01/18/2014 1104   K 4.3 02/06/2015 1231   K 4.4 01/01/2015 1020   K 4.1 01/18/2014 1104   CL 98  01/01/2015 1020   CL 96* 01/18/2014 1104   CO2 27 02/06/2015 1231   CO2 23 01/01/2015 1020   CO2 28 01/18/2014 1104   BUN 20.0 02/06/2015 1231   BUN 21 01/01/2015 1020   BUN 31* 11/07/2014 1213   BUN 21 01/18/2014 1104   CREATININE 1.4* 02/06/2015 1231   CREATININE 1.32* 01/01/2015 1020   CREATININE 1.5* 01/18/2014 1104      Component Value Date/Time   CALCIUM 10.0 02/06/2015 1231   CALCIUM 10.2 01/01/2015 1020   CALCIUM 10.0 01/18/2014 1104   ALKPHOS 72 02/06/2015 1231   ALKPHOS 76 01/01/2015 1020   ALKPHOS 68 01/18/2014 1104   AST 28 02/06/2015 1231   AST 32 01/01/2015 1020   AST 29 01/18/2014 1104   ALT 30 02/06/2015 1231   ALT 33* 01/01/2015 1020   ALT 34 01/18/2014 1104   BILITOT 0.47 02/06/2015 1231   BILITOT 0.4 01/01/2015 1020   BILITOT 0.4 11/07/2014 1213   BILITOT 0.70 01/18/2014 1104        Impression and Plan: Carmen [name] is 75 year old white female with hemachromatosis. She is a double heterozygote  We will see what her iron studies show. I would be surprised if she had a be phlebotomized but this is a possibility.  We will go ahead and plan to get her back in another 3 months.     Volanda Napoleon, MD 2/16/201711:33 AM

## 2015-05-10 ENCOUNTER — Telehealth: Payer: Self-pay | Admitting: *Deleted

## 2015-05-10 NOTE — Telephone Encounter (Addendum)
Patient aware of results  ----- Message from Volanda Napoleon, MD sent at 05/09/2015  5:14 PM EST ----- Call - ferritin is 61!!  NO phlebotomy needed!!!  Enjoy the [facility] B-Ball tournament!!  Carmen [name]

## 2015-06-04 ENCOUNTER — Other Ambulatory Visit: Payer: Self-pay | Admitting: Family Medicine

## 2015-06-05 ENCOUNTER — Other Ambulatory Visit: Payer: Self-pay | Admitting: Family Medicine

## 2015-06-05 NOTE — Telephone Encounter (Signed)
Last seen 01/01/15 Dr Sabra Heck   If approved print

## 2015-06-06 NOTE — Telephone Encounter (Signed)
Pt aware written Rx is at front desk ready for pickup  

## 2015-06-24 ENCOUNTER — Other Ambulatory Visit: Payer: Self-pay | Admitting: Family Medicine

## 2015-06-26 ENCOUNTER — Other Ambulatory Visit: Payer: Self-pay

## 2015-06-26 MED ORDER — ALIROCUMAB 75 MG/ML ~~LOC~~ SOPN: 75.0000 mg | PEN_INJECTOR | SUBCUTANEOUS | Status: DC

## 2015-06-26 NOTE — Telephone Encounter (Signed)
Patients last office visit was 11-16. Please advise on refill

## 2015-06-27 ENCOUNTER — Telehealth: Payer: Self-pay | Admitting: Family Medicine

## 2015-06-27 MED ORDER — ALIROCUMAB 75 MG/ML ~~LOC~~ SOPN: 75.0000 mg | PEN_INJECTOR | SUBCUTANEOUS | Status: DC

## 2015-06-27 NOTE — Telephone Encounter (Signed)
rx sent via [facility].

## 2015-07-05 ENCOUNTER — Other Ambulatory Visit: Payer: Self-pay | Admitting: Family Medicine

## 2015-07-08 ENCOUNTER — Other Ambulatory Visit: Payer: Self-pay | Admitting: Family Medicine

## 2015-07-08 NOTE — Telephone Encounter (Signed)
Last seen 01/01/15  Dr Sabra Heck  If approved print

## 2015-07-09 ENCOUNTER — Encounter: Payer: Self-pay | Admitting: Family Medicine

## 2015-07-09 ENCOUNTER — Ambulatory Visit (INDEPENDENT_AMBULATORY_CARE_PROVIDER_SITE_OTHER): Payer: Medicare Other | Admitting: Family Medicine

## 2015-07-09 VITALS — BP 126/63 | HR 61 | Temp 97.3°F | Ht 63.0 in | Wt 190.0 lb

## 2015-07-09 DIAGNOSIS — E1140 Type 2 diabetes mellitus with diabetic neuropathy, unspecified: Secondary | ICD-10-CM

## 2015-07-09 DIAGNOSIS — M1000 Idiopathic gout, unspecified site: Secondary | ICD-10-CM

## 2015-07-09 DIAGNOSIS — E785 Hyperlipidemia, unspecified: Secondary | ICD-10-CM

## 2015-07-09 DIAGNOSIS — Z87440 Personal history of urinary (tract) infections: Secondary | ICD-10-CM

## 2015-07-09 DIAGNOSIS — I10 Essential (primary) hypertension: Secondary | ICD-10-CM | POA: Diagnosis not present

## 2015-07-09 LAB — MICROSCOPIC EXAMINATION: Bacteria, UA: NONE SEEN

## 2015-07-09 LAB — URINALYSIS, COMPLETE
Bilirubin, UA: NEGATIVE
GLUCOSE, UA: NEGATIVE
KETONES UA: NEGATIVE
NITRITE UA: NEGATIVE
PROTEIN UA: NEGATIVE
RBC, UA: NEGATIVE
SPEC GRAV UA: 1.02 (ref 1.005–1.030)
Urobilinogen, Ur: 0.2 mg/dL (ref 0.2–1.0)
pH, UA: 6.5 (ref 5.0–7.5)

## 2015-07-09 LAB — BAYER DCA HB A1C WAIVED: HB A1C (BAYER DCA - WAIVED): 7.3 % — ABNORMAL HIGH (ref <7.0)

## 2015-07-09 MED ORDER — OMEGA-3-ACID ETHYL ESTERS 1 G PO CAPS: 2.0000 | ORAL_CAPSULE | Freq: Two times a day (BID) | ORAL | Status: DC

## 2015-07-09 MED ORDER — AMLODIPINE BESYLATE 2.5 MG PO TABS: 2.5000 mg | ORAL_TABLET | Freq: Every day | ORAL | Status: DC

## 2015-07-09 MED ORDER — METOPROLOL TARTRATE 25 MG PO TABS: ORAL_TABLET | ORAL | Status: DC

## 2015-07-09 MED ORDER — ALLOPURINOL 100 MG PO TABS: ORAL_TABLET | ORAL | Status: DC

## 2015-07-09 MED ORDER — SITAGLIPTIN PHOSPHATE 100 MG PO TABS: 100.0000 mg | ORAL_TABLET | Freq: Every day | ORAL | Status: DC

## 2015-07-09 MED ORDER — VALSARTAN 160 MG PO TABS: 160.0000 mg | ORAL_TABLET | Freq: Every day | ORAL | Status: DC

## 2015-07-09 MED ORDER — PREGABALIN 50 MG PO CAPS: ORAL_CAPSULE | ORAL | Status: DC

## 2015-07-09 MED ORDER — TRIAMTERENE-HCTZ 37.5-25 MG PO CAPS: ORAL_CAPSULE | ORAL | Status: DC

## 2015-07-09 NOTE — Patient Instructions (Signed)
Medicare Annual Wellness Visit  [place] and the medical providers at [facility] strive to bring you the best medical care.  In doing so we not only want to address your current medical conditions and concerns but also to detect new conditions early and prevent illness, disease and health-related problems.    Medicare offers a yearly Wellness Visit which allows our clinical staff to assess your need for preventative services including immunizations, lifestyle education, counseling to decrease risk of preventable diseases and screening for fall risk and other medical concerns.    This visit is provided free of charge (no copay) for all Medicare recipients. The clinical pharmacists at [facility] have begun to conduct these Wellness Visits which will also include a thorough review of all your medications.    As you primary medical provider recommend that you make an appointment for your Annual Wellness Visit if you have not done so already this year.  You may set up this appointment before you leave today or you may call back ([phone]) and schedule an appointment.  Please make sure when you call that you mention that you are scheduling your Annual Wellness Visit with the clinical pharmacist so that the appointment may be made for the proper length of time.     Continue current medications. Continue good therapeutic lifestyle changes which include good diet and exercise. Fall precautions discussed with patient. If an FOBT was given today- please return it to our front desk. If you are over 50 years old - you may need Prevnar 13 or the adult Pneumonia vaccine.  **Flu shots are available--- please call and schedule a FLU-CLINIC appointment**  After your visit with us today you will receive a survey in the mail or online from [employer] regarding your care with us. Please take a moment to fill this out. Your feedback is very  important to us as you can help us better understand your patient needs as well as improve your experience and satisfaction. WE CARE ABOUT YOU!!!    

## 2015-07-09 NOTE — Progress Notes (Signed)
Subjective:    Patient ID: Carmen [name], female    DOB: September 30, 1940, 75 y.o.   MRN: [id number]  HPI Pt here for follow up and management of chronic medical problems which includes diabetes and hyperlipidemia. She is taking medications regularly. Doing well on Praluent; gives herself injections every 2 weeks. Medicine as covered by her insurance is. Lyrica really helps her leg and foot pain when she tolerates it well. She did have one episode of what sounds like gout that was removed relieved by colchicine.     Patient Active Problem List   Diagnosis Date Noted  . Statin intolerance 08/18/2013  . Neuromuscular disorder ([place])   . Hemochromatosis, hereditary ([place]) 11/24/2012  . Pain 10/20/2012  . S/P CABG x 2 08/09/2012  . PVC's (premature ventricular contractions) 08/09/2012  . Gout 06/20/2012  . HTN (hypertension) 06/20/2012  . HLD (hyperlipidemia) 06/20/2012  . CAD (coronary artery disease) 06/20/2012  . DM (diabetes mellitus) type II controlled, neurological manifestation ([place]) 06/20/2012  . Arthritis 06/20/2012   Outpatient Encounter Prescriptions as of 07/09/2015  Medication Sig  . Alirocumab (PRALUENT) 75 MG/ML SOPN Inject 75 mg into the skin every 14 (fourteen) days.  Marland Kitchen allopurinol (ZYLOPRIM) 100 MG tablet TAKE  (1)  TABLET TWICE A DAY.  Marland Kitchen amLODipine (NORVASC) 2.5 MG tablet Take 1 tablet (2.5 mg total) by mouth daily.  Marland Kitchen aspirin 81 MG tablet Take 81 mg by mouth daily.  . colchicine 0.6 MG tablet TAKE (1) TABLET DAILY AS NEEDED.  . fluticasone (FLONASE) 50 MCG/ACT nasal spray SPRAY 1 SPRAY IN EACH NOSTRIL ONCE DAILY.  . metoprolol tartrate (LOPRESSOR) 25 MG tablet TAKE  (1)  TABLET TWICE A DAY.  Marland Kitchen omega-3 acid ethyl esters (LOVAZA) 1 g capsule Take 2 capsules (2 g total) by mouth 2 (two) times daily.  . ONE TOUCH ULTRA TEST test strip CHECK BLOOD SUGAR 2 TIMES A DAY  . pregabalin (LYRICA) 50 MG capsule TAKE  (1)  CAPSULE  TWICE DAILY.  . sitaGLIPtin (JANUVIA) 100 MG  tablet Take 1 tablet (100 mg total) by mouth daily.  Marland Kitchen triamterene-hydrochlorothiazide (DYAZIDE) 37.5-25 MG capsule TAKE (1) CAPSULE DAILY  . valsartan (DIOVAN) 160 MG tablet Take 1 tablet (160 mg total) by mouth daily.  . VOLTAREN 1 % GEL Apply 4 g topically 4 (four) times daily as needed.  . [DISCONTINUED] allopurinol (ZYLOPRIM) 100 MG tablet TAKE  (1)  TABLET TWICE A DAY.  . [DISCONTINUED] amLODipine (NORVASC) 2.5 MG tablet TAKE 1 TABLET DAILY  . [DISCONTINUED] JANUVIA 100 MG tablet TAKE 1 TABLET DAILY  . [DISCONTINUED] LYRICA 50 MG capsule TAKE  (1)  CAPSULE  TWICE DAILY.  . [DISCONTINUED] metoprolol tartrate (LOPRESSOR) 25 MG tablet TAKE  (1)  TABLET TWICE A DAY.  . [DISCONTINUED] omega-3 acid ethyl esters (LOVAZA) 1 g capsule Take 2 capsules (2 g total) by mouth 2 (two) times daily.  . [DISCONTINUED] triamterene-hydrochlorothiazide (DYAZIDE) 37.5-25 MG capsule TAKE (1) CAPSULE DAILY  . [DISCONTINUED] valsartan (DIOVAN) 160 MG tablet TAKE 1 TABLET DAILY   No facility-administered encounter medications on file as of 07/09/2015.      Review of Systems  Constitutional: Negative.   HENT: Negative.   Eyes: Negative.   Respiratory: Negative.   Cardiovascular: Negative.   Gastrointestinal: Negative.   Endocrine: Negative.   Genitourinary: Negative.   Musculoskeletal: Negative.   Skin: Negative.   Allergic/Immunologic: Negative.   Neurological: Negative.   Hematological: Negative.   Psychiatric/Behavioral: Negative.  Objective:   Physical Exam  Constitutional: She is oriented to person, place, and time. She appears well-developed and well-nourished.  Cardiovascular: Normal rate, regular rhythm, normal heart sounds and intact distal pulses.   Pulmonary/Chest: Effort normal and breath sounds normal.  Abdominal: Soft. There is no tenderness.  Neurological: She is alert and oriented to person, place, and time.  Psychiatric: She has a normal mood and affect. Her behavior is  normal.   BP 126/63 mmHg  Pulse 61  Temp(Src) 97.3 F (36.3 C) (Oral)  Ht 5' 3"  (1.6 m)  Wt 190 lb (86.183 kg)  BMI 33.67 kg/m2   1. Essential hypertension Pressure is doing well on combination valsartan triamterene and metoprolol and amlodipine - CMP14+EGFR  2. Controlled type 2 diabetes mellitus with diabetic neuropathy, without long-term current use of insulin (HCC) Would like little lower Stage IC was 6.9 - Bayer DCA Hb A1c Waived  3. HLD (hyperlipidemia) *Great on current regimen. Will continue same - Lipid panel  4. Idiopathic gout, unspecified chronicity, unspecified site She takes allopurinol to control uric acid levels. Gout attacks are infrequent - Uric acid  5. Recent urinary tract infection Asymptomatic with but would like to recheck her urine that was positive at last visit - Urinalysis, Complete      Assessment & Plan:

## 2015-07-09 NOTE — Telephone Encounter (Signed)
Prescription placed at front desk, patient informed

## 2015-07-10 LAB — LIPID PANEL
CHOLESTEROL TOTAL: 179 mg/dL (ref 100–199)
Chol/HDL Ratio: 4.5 ratio units — ABNORMAL HIGH (ref 0.0–4.4)
HDL: 40 mg/dL (ref >39)
LDL Calculated: 82 mg/dL (ref 0–99)
Triglycerides: 286 mg/dL — ABNORMAL HIGH (ref 0–149)
VLDL Cholesterol Cal: 57 mg/dL — ABNORMAL HIGH (ref 5–40)

## 2015-07-10 LAB — CMP14+EGFR
A/G RATIO: 1.4 (ref 1.2–2.2)
ALBUMIN: 4.3 g/dL (ref 3.5–4.8)
ALK PHOS: 74 IU/L (ref 39–117)
ALT: 38 IU/L — ABNORMAL HIGH (ref 0–32)
AST: 40 IU/L (ref 0–40)
BILIRUBIN TOTAL: 0.3 mg/dL (ref 0.0–1.2)
BUN / CREAT RATIO: 14 (ref 12–28)
BUN: 18 mg/dL (ref 8–27)
CHLORIDE: 96 mmol/L (ref 96–106)
CO2: 25 mmol/L (ref 18–29)
Calcium: 10.5 mg/dL — ABNORMAL HIGH (ref 8.7–10.3)
Creatinine, Ser: 1.27 mg/dL — ABNORMAL HIGH (ref 0.57–1.00)
GFR calc non Af Amer: 41 mL/min/{1.73_m2} — ABNORMAL LOW (ref >59)
GFR, EST AFRICAN AMERICAN: 48 mL/min/{1.73_m2} — AB (ref >59)
Globulin, Total: 3 g/dL (ref 1.5–4.5)
Glucose: 190 mg/dL — ABNORMAL HIGH (ref 65–99)
POTASSIUM: 4.3 mmol/L (ref 3.5–5.2)
Sodium: 141 mmol/L (ref 134–144)
TOTAL PROTEIN: 7.3 g/dL (ref 6.0–8.5)

## 2015-07-10 LAB — URIC ACID: Uric Acid: 6.2 mg/dL (ref 2.5–7.1)

## 2015-07-25 ENCOUNTER — Encounter: Payer: Self-pay | Admitting: *Deleted

## 2015-08-05 ENCOUNTER — Other Ambulatory Visit: Payer: Self-pay | Admitting: Family Medicine

## 2015-08-05 NOTE — Telephone Encounter (Signed)
Last seen 07/09/15  Dr Sabra Heck  If approved route to nurse to call into [facility]

## 2015-08-06 NOTE — Telephone Encounter (Signed)
RX for Lyrica called into [facility] per Dr Sabra Heck

## 2015-08-07 ENCOUNTER — Other Ambulatory Visit: Payer: Self-pay | Admitting: Family Medicine

## 2015-08-08 ENCOUNTER — Other Ambulatory Visit: Payer: Medicare Other

## 2015-08-08 ENCOUNTER — Ambulatory Visit: Payer: Medicare Other | Admitting: Hematology & Oncology

## 2015-08-10 ENCOUNTER — Other Ambulatory Visit: Payer: Self-pay | Admitting: Family Medicine

## 2015-08-27 ENCOUNTER — Ambulatory Visit (HOSPITAL_BASED_OUTPATIENT_CLINIC_OR_DEPARTMENT_OTHER): Payer: Medicare Other | Admitting: Hematology & Oncology

## 2015-08-27 ENCOUNTER — Encounter: Payer: Self-pay | Admitting: Hematology & Oncology

## 2015-08-27 ENCOUNTER — Other Ambulatory Visit (HOSPITAL_BASED_OUTPATIENT_CLINIC_OR_DEPARTMENT_OTHER): Payer: Medicare Other

## 2015-08-27 LAB — COMPREHENSIVE METABOLIC PANEL
ALBUMIN: 3.8 g/dL (ref 3.5–5.0)
ALT: 35 U/L (ref 0–55)
ANION GAP: 8 meq/L (ref 3–11)
AST: 34 U/L (ref 5–34)
Alkaline Phosphatase: 74 U/L (ref 40–150)
BILIRUBIN TOTAL: 0.33 mg/dL (ref 0.20–1.20)
BUN: 21.3 mg/dL (ref 7.0–26.0)
CALCIUM: 9.7 mg/dL (ref 8.4–10.4)
CHLORIDE: 105 meq/L (ref 98–109)
CO2: 25 mEq/L (ref 22–29)
CREATININE: 1.4 mg/dL — AB (ref 0.6–1.1)
EGFR: 37 mL/min/{1.73_m2} — ABNORMAL LOW (ref >90)
Glucose: 169 mg/dl — ABNORMAL HIGH (ref 70–140)
Potassium: 4.2 mEq/L (ref 3.5–5.1)
Sodium: 139 mEq/L (ref 136–145)
TOTAL PROTEIN: 7.6 g/dL (ref 6.4–8.3)

## 2015-08-27 LAB — CBC WITH DIFFERENTIAL (CANCER CENTER ONLY)
BASO#: 0 10*3/uL (ref 0.0–0.2)
BASO%: 0.5 % (ref 0.0–2.0)
EOS ABS: 0.4 10*3/uL (ref 0.0–0.5)
EOS%: 4 % (ref 0.0–7.0)
HEMATOCRIT: 43.7 % (ref 34.8–46.6)
HEMOGLOBIN: 15 g/dL (ref 11.6–15.9)
LYMPH#: 3.4 10*3/uL — AB (ref 0.9–3.3)
LYMPH%: 38.5 % (ref 14.0–48.0)
MCH: 31.4 pg (ref 26.0–34.0)
MCHC: 34.3 g/dL (ref 32.0–36.0)
MCV: 92 fL (ref 81–101)
MONO#: 0.5 10*3/uL (ref 0.1–0.9)
MONO%: 6 % (ref 0.0–13.0)
NEUT%: 51 % (ref 39.6–80.0)
NEUTROS ABS: 4.5 10*3/uL (ref 1.5–6.5)
PLATELETS: 145 10*3/uL (ref 145–400)
RBC: 4.77 10*6/uL (ref 3.70–5.32)
RDW: 13.7 % (ref 11.1–15.7)
WBC: 8.7 10*3/uL (ref 3.9–10.0)

## 2015-08-27 LAB — FERRITIN: FERRITIN: 82 ng/mL (ref 9–269)

## 2015-08-27 LAB — IRON AND TIBC
%SAT: 34 % (ref 21–57)
Iron: 93 ug/dL (ref 41–142)
TIBC: 269 ug/dL (ref 236–444)
UIBC: 176 ug/dL (ref 120–384)

## 2015-08-27 NOTE — Progress Notes (Signed)
Hematology and Oncology Follow Up Visit  NAYDELYN KYER [id number] [birth year] 75 y.o. 08/27/2015   Principle Diagnosis:  Hemochromatosis (double heterozygote for C282Y and S65C mutations).  Current Therapy:    Phlebotomy to maintain ferritin less than 100     Interim History:  Ms.  Carmen [name] is back for followup. She is doing okay. She was last phlebotomized in December.   We saw her in February, her ferritin was only 61. Her iron saturation was 29%.  She does have a living of a sinus cold. She gets this on occasion.  Otherwise, she is feeling pretty well. She's had no nausea or vomiting. She's had no change in bowel or bladder habits. She's had no leg swelling. She's had no rashes. Overall, her performance status is ECOG 1.  Medications:  Current outpatient prescriptions:  .  Alirocumab (PRALUENT) 75 MG/ML SOPN, Inject 75 mg into the skin every 14 (fourteen) days., Disp: 12 pen, Rfl: 0 .  allopurinol (ZYLOPRIM) 100 MG tablet, TAKE  (1)  TABLET TWICE A DAY., Disp: 60 tablet, Rfl: 5 .  amLODipine (NORVASC) 2.5 MG tablet, Take 1 tablet (2.5 mg total) by mouth daily., Disp: 30 tablet, Rfl: 5 .  aspirin 81 MG tablet, Take 81 mg by mouth daily., Disp: , Rfl:  .  colchicine 0.6 MG tablet, TAKE (1) TABLET DAILY AS NEEDED., Disp: 30 tablet, Rfl: 1 .  fluticasone (FLONASE) 50 MCG/ACT nasal spray, SPRAY 1 SPRAY IN EACH NOSTRIL ONCE DAILY., Disp: 16 g, Rfl: 3 .  LYRICA 50 MG capsule, TAKE  (1)  CAPSULE  TWICE DAILY., Disp: 60 capsule, Rfl: 1 .  metoprolol tartrate (LOPRESSOR) 25 MG tablet, TAKE  (1)  TABLET TWICE A DAY., Disp: 60 tablet, Rfl: 5 .  omega-3 acid ethyl esters (LOVAZA) 1 g capsule, Take 2 capsules (2 g total) by mouth 2 (two) times daily., Disp: 360 capsule, Rfl: 5 .  ONE TOUCH ULTRA TEST test strip, CHECK BLOOD SUGAR 2 TIMES A DAY, Disp: 100 each, Rfl: 2 .  sitaGLIPtin (JANUVIA) 100 MG tablet, Take 1 tablet (100 mg total) by mouth daily., Disp: 30 tablet, Rfl: 5 .   triamterene-hydrochlorothiazide (DYAZIDE) 37.5-25 MG capsule, TAKE (1) CAPSULE DAILY, Disp: 30 capsule, Rfl: 5 .  valsartan (DIOVAN) 160 MG tablet, Take 1 tablet (160 mg total) by mouth daily., Disp: 30 tablet, Rfl: 5 .  VOLTAREN 1 % GEL, Apply 4 g topically 4 (four) times daily as needed., Disp: 100 g, Rfl: 2  Allergies:  Allergies  Allergen Reactions  . Crestor [Rosuvastatin]     Muscle aches on 5 mg daily and 10 mg twice weekly  . Lipitor [Atorvastatin]   . Livalo [Pitavastatin]   . Zetia [Ezetimibe]   . Zocor [Simvastatin]     Muscle aches   . Sulfa Antibiotics Rash    Past Medical History, Surgical history, Social history, and Family History were reviewed and updated.  Review of Systems: As above  Physical Exam:  height is 5\' 3"  (1.6 m) and weight is 192 lb (87.091 kg). Her oral temperature is 97.9 F (36.6 C). Her blood pressure is 151/56 and her pulse is 58. Her respiration is 14.   Well developed and well nourished white female. Lungs are clear. Cardiac exam regular rate and rhythm with no murmurs, rubs or bruits.. Neck without adenopathy. Her thyroid is nonpalpable. Ocular exam is normal. Abdomen is soft. There is no palpable liver or spleen. She has good bowel sounds. Back exam shows no tenderness  over the spine, ribs or hips.. Extremities some slight chronic nonpitting edema. She has good strength in her extremity is. She has good range of motion of her joints. Skin exam no rashes, ecchymoses or petechia. Neurological exam nonfocal.  Lab Results  Component Value Date   WBC 8.7 08/27/2015   HGB 15.0 08/27/2015   HCT 43.7 08/27/2015   MCV 92 08/27/2015   PLT 145 08/27/2015     Chemistry      Component Value Date/Time   NA 141 07/09/2015 1326   NA 141 05/09/2015 1012   NA 142 11/07/2014 1213   NA 141 01/18/2014 1104   K 4.3 07/09/2015 1326   K 4.2 05/09/2015 1012   K 4.1 01/18/2014 1104   CL 96 07/09/2015 1326   CL 96* 01/18/2014 1104   CO2 25 07/09/2015 1326    CO2 27 05/09/2015 1012   CO2 28 01/18/2014 1104   BUN 18 07/09/2015 1326   BUN 23.4 05/09/2015 1012   BUN 31* 11/07/2014 1213   BUN 21 01/18/2014 1104   CREATININE 1.27* 07/09/2015 1326   CREATININE 1.6* 05/09/2015 1012   CREATININE 1.5* 01/18/2014 1104      Component Value Date/Time   CALCIUM 10.5* 07/09/2015 1326   CALCIUM 10.2 05/09/2015 1012   CALCIUM 10.0 01/18/2014 1104   ALKPHOS 74 07/09/2015 1326   ALKPHOS 77 05/09/2015 1012   ALKPHOS 68 01/18/2014 1104   AST 40 07/09/2015 1326   AST 27 05/09/2015 1012   AST 29 01/18/2014 1104   ALT 38* 07/09/2015 1326   ALT 28 05/09/2015 1012   ALT 34 01/18/2014 1104   BILITOT 0.3 07/09/2015 1326   BILITOT 0.45 05/09/2015 1012   BILITOT 0.4 11/07/2014 1213   BILITOT 0.70 01/18/2014 1104        Impression and Plan: Ms. Cabello is 75 year old white female with hemachromatosis. She is a double heterozygote  We will see what her iron studies show. I would be surprised if she had a be phlebotomized but this is a possibility.  We will go ahead and plan to get her back in another 4 months.   Volanda Napoleon, MD 6/6/20171:11 PM

## 2015-08-28 ENCOUNTER — Encounter: Payer: Self-pay | Admitting: *Deleted

## 2015-09-02 ENCOUNTER — Ambulatory Visit (HOSPITAL_BASED_OUTPATIENT_CLINIC_OR_DEPARTMENT_OTHER): Payer: Medicare Other

## 2015-09-02 ENCOUNTER — Other Ambulatory Visit: Payer: Self-pay | Admitting: Family Medicine

## 2015-09-02 NOTE — Patient Instructions (Signed)

## 2015-09-02 NOTE — Progress Notes (Signed)
Carmen [name] presents today for phlebotomy per MD orders. Phlebotomy procedure started at 1300 and ended at 1315. 500 grams removed. Patient observed for 30 minutes after procedure without any incident. Patient tolerated procedure well. IV needle removed intact.

## 2015-10-03 ENCOUNTER — Other Ambulatory Visit: Payer: Self-pay | Admitting: Family Medicine

## 2015-10-03 NOTE — Telephone Encounter (Signed)
Okay to refill Lyrica

## 2015-10-03 NOTE — Telephone Encounter (Signed)
Last filled 09/06/15, last seen 07/09/15. [name] pt. Route to pool

## 2015-10-30 DIAGNOSIS — Z85828 Personal history of other malignant neoplasm of skin: Secondary | ICD-10-CM | POA: Diagnosis not present

## 2015-10-30 DIAGNOSIS — C4361 Malignant melanoma of right upper limb, including shoulder: Secondary | ICD-10-CM | POA: Diagnosis not present

## 2015-10-30 DIAGNOSIS — L570 Actinic keratosis: Secondary | ICD-10-CM | POA: Diagnosis not present

## 2015-10-30 DIAGNOSIS — L814 Other melanin hyperpigmentation: Secondary | ICD-10-CM | POA: Diagnosis not present

## 2015-10-30 DIAGNOSIS — L821 Other seborrheic keratosis: Secondary | ICD-10-CM | POA: Diagnosis not present

## 2015-11-05 ENCOUNTER — Other Ambulatory Visit: Payer: Self-pay | Admitting: Family Medicine

## 2015-11-05 DIAGNOSIS — C4361 Malignant melanoma of right upper limb, including shoulder: Secondary | ICD-10-CM | POA: Diagnosis not present

## 2015-11-05 DIAGNOSIS — Z6834 Body mass index (BMI) 34.0-34.9, adult: Secondary | ICD-10-CM | POA: Diagnosis not present

## 2015-11-05 DIAGNOSIS — Z01419 Encounter for gynecological examination (general) (routine) without abnormal findings: Secondary | ICD-10-CM | POA: Diagnosis not present

## 2015-11-05 DIAGNOSIS — D0361 Melanoma in situ of right upper limb, including shoulder: Secondary | ICD-10-CM | POA: Diagnosis not present

## 2015-11-05 DIAGNOSIS — Z124 Encounter for screening for malignant neoplasm of cervix: Secondary | ICD-10-CM | POA: Diagnosis not present

## 2015-11-07 DIAGNOSIS — N819 Female genital prolapse, unspecified: Secondary | ICD-10-CM | POA: Diagnosis not present

## 2015-11-12 ENCOUNTER — Ambulatory Visit (INDEPENDENT_AMBULATORY_CARE_PROVIDER_SITE_OTHER): Payer: Medicare Other | Admitting: Internal Medicine

## 2015-11-12 ENCOUNTER — Encounter: Payer: Self-pay | Admitting: Internal Medicine

## 2015-11-12 VITALS — BP 140/74 | HR 61 | Ht 63.0 in | Wt 191.4 lb

## 2015-11-12 DIAGNOSIS — E1140 Type 2 diabetes mellitus with diabetic neuropathy, unspecified: Secondary | ICD-10-CM

## 2015-11-12 DIAGNOSIS — E785 Hyperlipidemia, unspecified: Secondary | ICD-10-CM | POA: Diagnosis not present

## 2015-11-12 DIAGNOSIS — Z951 Presence of aortocoronary bypass graft: Secondary | ICD-10-CM

## 2015-11-12 DIAGNOSIS — E119 Type 2 diabetes mellitus without complications: Secondary | ICD-10-CM | POA: Insufficient documentation

## 2015-11-12 DIAGNOSIS — Z889 Allergy status to unspecified drugs, medicaments and biological substances status: Secondary | ICD-10-CM | POA: Diagnosis not present

## 2015-11-12 DIAGNOSIS — Z789 Other specified health status: Secondary | ICD-10-CM

## 2015-11-12 DIAGNOSIS — I10 Essential (primary) hypertension: Secondary | ICD-10-CM

## 2015-11-12 NOTE — Progress Notes (Signed)
OFFICE NOTE  Chief Complaint:  Annual office visit  Primary Care Physician: Wardell Honour, MD  HPI:  Carmen [name] is a 75 year-old female comes in for followup of coronary disease. She had bypass surgery in 2007 and at that time had a 40% ejection fraction. She had a nuclear study in May of 2012 that was a low-risk study, but we could not calculate an ejection fraction because of ectopy. She has had no episodes of angina, no unusual shortness of breath unless she walks up a hill or a flight of steps, and then she becomes mildly breathless. She does not have PND or orthopnea. She does not have a productive cough. She has had no awareness of any arrhythmias, no dizziness, no syncope. She does have mild left ankle edema that is dependent in nature. She wears support hose infrequently for this. She has recurrent problems with gout, usually in her great toe. She has made some significant dietary changes, and the gout episodes seem to be less.  She has been diabetic for around 2 years. She has a hemoglobin A1c of 5.8, has had no significant episodes of hypoglycemia. She has noted some proximal lower extremity weakness. She gave an example working in her garden, when she tried to get up her legs were so weak that she had to get up on all fours, and one time her husband had to get her up. She has gone through physical therapy recently because of right hip bursitis, and it may very well be that she needs additional physical therapy for proximal muscle weakness and core strength building. No significant problems from the pollen. She has had some problems with constipation. Otherwise, no new complaints today.  Of note she's had a markedly abnormal lipid profile with particle numbers greater than 3000. She has been intolerant to all statins and has failed Crestor, Zetia, Zocor, Lipitor and Livalo in the past. I feel that she remains at very high risk of cardiac events and there is a great  likelihood that she may develop premature graft failure with her high cholesterol numbers. We did discuss the new PCSK9 inhibitors, for which she may be a great candidate if she is willing to give herself an injection every 2-4 weeks..  I saw Ms. Pipp back in the office today. She is doing really well and denies any chest pain or worsening shortness of breath. She was referred for a trial of a study drug for PCS K9 inhibition. Unfortunately this was never arranged however subsequently to [facility] SK 9 inhibitors came out to the market. She was subsequently started on Praluent by her primary care provider and is been using that for several months. Hopefully this will get her cholesterol down to goal.  11/12/2015  Mrs. Shelman returns today for follow-up. Over the past year she's done fairly well. She occasionally gets some swelling in her legs. She denies any chest pain or shortness of breath. We had started her on Praluent at her last office visit due to statin intolerance. I again discussed the importance of using at least low-dose statin with the Praluent foremost benefit, however she has had a significant reduction in her cholesterol with LDL that was greater than 180 now down to 82. Although she has not had any coronary event since her bypass, it is now been 10 years since her bypass surgery. She last had a stress test in 2012 which was negative for ischemia.  PMHx:  Past Medical History:  Diagnosis  Date  . Anemia   . Arthritis   . CAD (coronary artery disease)   . Diabetes mellitus without complication ([place])   . Diverticulosis   . Gout   . Hemochromatosis, hereditary ([place]) 11/24/2012  . Hyperlipidemia   . Hypertension   . Neuromuscular disorder ([place])    peripheral neuropathy    Past Surgical History:  Procedure Laterality Date  . BREAST SURGERY     left breast lump--benign  . CARDIAC CATHETERIZATION  06/03/2005  . CORONARY ARTERY BYPASS GRAFT  2007   x2  . LIPOMA EXCISION     back     FAMHx:  Family History  Problem Relation Age of Onset  . Stroke Mother   . Hypertension Mother   . Neuropathy Mother   . Stroke Father   . Hypertension Father   . Diabetes Father   . Cancer Sister 58    sarcoma  . Diabetes Brother   . Arthritis Sister   . Arthritis Sister   . Lupus Sister   . Hemachromatosis Sister   . Arthritis Sister   . Hemachromatosis Sister   . Hemachromatosis Sister   . Diverticulitis Sister   . Diabetes Brother   . Diabetes Brother   . Hemachromatosis Brother     SOCHx:   reports that she has never smoked. She has never used smokeless tobacco. She reports that she does not drink alcohol or use drugs.  ALLERGIES:  Allergies  Allergen Reactions  . Crestor [Rosuvastatin] Other (See Comments)    Muscle aches on 5 mg daily and 10 mg twice weekly  . Lipitor [Atorvastatin] Other (See Comments)    Muscle aches  . Livalo [Pitavastatin] Other (See Comments)       . Sulfa Antibiotics Rash  . Zetia [Ezetimibe] Other (See Comments)    Muscle aches  . Zocor [Simvastatin] Other (See Comments)    Muscle aches     ROS: Pertinent items noted in HPI and remainder of comprehensive ROS otherwise negative.  HOME MEDS: Current Outpatient Prescriptions  Medication Sig Dispense Refill  . Alirocumab (PRALUENT) 75 MG/ML SOPN Inject 75 mg into the skin every 14 (fourteen) days. 12 pen 0  . allopurinol (ZYLOPRIM) 100 MG tablet TAKE  (1)  TABLET TWICE A DAY. 60 tablet 5  . amLODipine (NORVASC) 2.5 MG tablet Take 1 tablet (2.5 mg total) by mouth daily. 30 tablet 5  . aspirin 81 MG tablet Take 81 mg by mouth daily.    . colchicine 0.6 MG tablet TAKE (1) TABLET DAILY AS NEEDED. 30 tablet 1  . fluticasone (FLONASE) 50 MCG/ACT nasal spray SPRAY 1 SPRAY IN EACH NOSTRIL ONCE DAILY. 16 g 3  . LYRICA 50 MG capsule TAKE  (1)  CAPSULE  TWICE DAILY. 60 capsule 0  . metoprolol tartrate (LOPRESSOR) 25 MG tablet TAKE  (1)  TABLET TWICE A DAY. 60 tablet 5  . omega-3 acid  ethyl esters (LOVAZA) 1 g capsule Take 2 capsules (2 g total) by mouth 2 (two) times daily. 360 capsule 5  . ONE TOUCH ULTRA TEST test strip CHECK BLOOD SUGAR 2 TIMES A DAY 100 each 2  . sitaGLIPtin (JANUVIA) 100 MG tablet Take 1 tablet (100 mg total) by mouth daily. 30 tablet 5  . triamterene-hydrochlorothiazide (DYAZIDE) 37.5-25 MG capsule TAKE (1) CAPSULE DAILY 30 capsule 5  . valsartan (DIOVAN) 160 MG tablet Take 1 tablet (160 mg total) by mouth daily. 30 tablet 5  . VOLTAREN 1 % GEL Apply 4 g  topically 4 (four) times daily as needed. 100 g 2   No current facility-administered medications for this visit.     LABS/IMAGING: No results found for this or any previous visit (from the past 48 hour(s)). No results found.  VITALS: BP 140/74 (BP Location: Left Arm, Patient Position: Sitting, Cuff Size: Large)   Pulse 61   Ht 5\' 3"  (1.6 m)   Wt 191 lb 6.4 oz (86.8 kg)   SpO2 96%   BMI 33.90 kg/m   EXAM: General appearance: alert, no distress and mildly obese Neck: no carotid bruit and no JVD Lungs: clear to auscultation bilaterally Heart: regular rate and rhythm, S1, S2 normal, no murmur, click, rub or gallop Abdomen: soft, non-tender; bowel sounds normal; no masses,  no organomegaly Extremities: extremities normal, atraumatic, no cyanosis or edema and venous stasis dermatitis noted Pulses: 2+ and symmetric Skin: Skin color, texture, turgor normal. No rashes or lesions Neurologic: Grossly normal  EKG: Sinus rhythm at 61, probable inferior infarct  ASSESSMENT: 1. Coronary artery bypass grafting x2 vessels with LIMA to LAD and SVG to PDA in 2007 2. Asymptomatic PVCs 3. Hypertension-controlled 4. Dyslipidemia - intolerant to all statins and zetia, markedly improved on Praluent 5. Diabetes 6. Gout   PLAN: 1.   Ms. Michalowski remains asymptomatic from a coronary standpoint. She is planning on going on a trip with her husband in a few weeks to visit a couple of states that he has not  seen. She last had stress testing 5 years ago and based on guidelines with her prior bypass in 2007 it is indicated to have stress testing even in asymptomatic patients every 5 years. I would recommend a repeat Lexiscan Myoview which she's returned from her trip to reassess her graft function. Blood pressure appears to be fairly well-controlled at home and she did not take her medicine this morning therefore the numbers are high normal. Cholesterol has improved significantly on Praluent and she is tolerating it without difficulty.  We'll contact her with the results of her myoview. Otherwise, follow-up annually.  Pixie Casino, MD, [facility] Attending Cardiologist [place] 11/12/2015, 12:03 PM

## 2015-11-12 NOTE — Patient Instructions (Addendum)
Your physician has requested that you have a lexiscan myoview. For further information please visit [email or web address]. Please follow instruction sheet, as given.  Your physician wants you to follow-up in: 12 months with Dr. Hilty. You will receive a reminder letter in the mail two months in advance. If you don't receive a letter, please call our office to schedule the follow-up appointment.  

## 2015-11-22 DIAGNOSIS — C801 Malignant (primary) neoplasm, unspecified: Secondary | ICD-10-CM

## 2015-11-22 HISTORY — DX: Malignant (primary) neoplasm, unspecified: C80.1

## 2015-12-04 ENCOUNTER — Other Ambulatory Visit: Payer: Self-pay | Admitting: Family Medicine

## 2015-12-05 ENCOUNTER — Telehealth: Payer: Self-pay | Admitting: Family Medicine

## 2015-12-05 ENCOUNTER — Other Ambulatory Visit: Payer: Self-pay

## 2015-12-05 ENCOUNTER — Other Ambulatory Visit: Payer: Self-pay | Admitting: *Deleted

## 2015-12-05 MED ORDER — PREGABALIN 50 MG PO CAPS: ORAL_CAPSULE | ORAL | 1 refills | Status: DC

## 2015-12-05 MED ORDER — ALIROCUMAB 75 MG/ML ~~LOC~~ SOPN: 75.0000 mg | PEN_INJECTOR | SUBCUTANEOUS | 2 refills | Status: DC

## 2015-12-05 NOTE — Telephone Encounter (Signed)
Please address

## 2015-12-05 NOTE — Telephone Encounter (Signed)
Done electronically to [facility]

## 2015-12-31 ENCOUNTER — Ambulatory Visit (HOSPITAL_BASED_OUTPATIENT_CLINIC_OR_DEPARTMENT_OTHER): Payer: Medicare Other | Admitting: Family

## 2015-12-31 ENCOUNTER — Encounter: Payer: Self-pay | Admitting: Family

## 2015-12-31 ENCOUNTER — Telehealth: Payer: Self-pay | Admitting: *Deleted

## 2015-12-31 ENCOUNTER — Other Ambulatory Visit (HOSPITAL_BASED_OUTPATIENT_CLINIC_OR_DEPARTMENT_OTHER): Payer: Medicare Other

## 2015-12-31 DIAGNOSIS — C4361 Malignant melanoma of right upper limb, including shoulder: Secondary | ICD-10-CM

## 2015-12-31 LAB — CBC WITH DIFFERENTIAL (CANCER CENTER ONLY)
BASO#: 0 10*3/uL (ref 0.0–0.2)
BASO%: 0.2 % (ref 0.0–2.0)
EOS%: 2.7 % (ref 0.0–7.0)
Eosinophils Absolute: 0.3 10*3/uL (ref 0.0–0.5)
HEMATOCRIT: 43.5 % (ref 34.8–46.6)
HGB: 15 g/dL (ref 11.6–15.9)
LYMPH#: 3.4 10*3/uL — AB (ref 0.9–3.3)
LYMPH%: 36.8 % (ref 14.0–48.0)
MCH: 31.1 pg (ref 26.0–34.0)
MCHC: 34.5 g/dL (ref 32.0–36.0)
MCV: 90 fL (ref 81–101)
MONO#: 0.5 10*3/uL (ref 0.1–0.9)
MONO%: 5.2 % (ref 0.0–13.0)
NEUT#: 5.1 10*3/uL (ref 1.5–6.5)
NEUT%: 55.1 % (ref 39.6–80.0)
PLATELETS: 141 10*3/uL — AB (ref 145–400)
RBC: 4.82 10*6/uL (ref 3.70–5.32)
RDW: 13.8 % (ref 11.1–15.7)
WBC: 9.3 10*3/uL (ref 3.9–10.0)

## 2015-12-31 LAB — IRON AND TIBC
%SAT: 35 % (ref 21–57)
IRON: 96 ug/dL (ref 41–142)
TIBC: 275 ug/dL (ref 236–444)
UIBC: 179 ug/dL (ref 120–384)

## 2015-12-31 LAB — COMPREHENSIVE METABOLIC PANEL
ALT: 36 U/L (ref 0–55)
AST: 31 U/L (ref 5–34)
Albumin: 3.7 g/dL (ref 3.5–5.0)
Alkaline Phosphatase: 87 U/L (ref 40–150)
Anion Gap: 12 mEq/L — ABNORMAL HIGH (ref 3–11)
BILIRUBIN TOTAL: 0.42 mg/dL (ref 0.20–1.20)
BUN: 21.4 mg/dL (ref 7.0–26.0)
CO2: 23 meq/L (ref 22–29)
Calcium: 9.7 mg/dL (ref 8.4–10.4)
Chloride: 104 mEq/L (ref 98–109)
Creatinine: 1.4 mg/dL — ABNORMAL HIGH (ref 0.6–1.1)
EGFR: 37 mL/min/{1.73_m2} — AB (ref >90)
GLUCOSE: 211 mg/dL — AB (ref 70–140)
POTASSIUM: 4.2 meq/L (ref 3.5–5.1)
SODIUM: 140 meq/L (ref 136–145)
TOTAL PROTEIN: 7.6 g/dL (ref 6.4–8.3)

## 2015-12-31 LAB — FERRITIN: Ferritin: 102 ng/ml (ref 9–269)

## 2015-12-31 NOTE — Telephone Encounter (Addendum)
Patient aware of results. Message sent to scheduler.  ----- Message from Volanda Napoleon, MD sent at 12/31/2015  2:18 PM EDT ----- Call - ferritin is > 100!!  Need 1 phlebotomy.  Please set up!!  pete

## 2015-12-31 NOTE — Progress Notes (Signed)
Hematology and Oncology Follow Up Visit  Carmen [name] [id number] 08/07/1940 75 y.o. 12/31/2015   Principle Diagnosis:  Hemochromatosis (double heterozygote for C282Y and S65C mutations) Melanoma of right upper arm - waiting on pathology report from derm   Current Therapy:   Phlebotomy to maintain ferritin less than 100    Interim History:  Carmen [name] is here today with her husband for a follow-up. She is doing well but recently had a place positive for melanoma removed from her right upper arm. She states that the borders were clear and she continues to be followed closely by dermatology every 3 months. Her next appointment is on November 15th.  She denies fatigue. No fever, chills, n/v, cough, rash, metallic taste in mouth, dizziness, SOB, chest pain, palpitations, abdominal pain or changes in bowel or bladder habits.  She has had some arthritic pain in her hands. She has neuropathy in her feet that is unchanged. No swelling in her extremities.  Her last phlebotomy was in June. Ferritin at that time was 82.  She has maintained a good appetite and is staying well hydrated. Her weight is unchanged.  I did retake her BP manually and it was 128/82.   Medications:    Medication List       Accurate as of 12/31/15 11:39 AM. Always use your most recent med list.          Alirocumab 75 MG/ML Sopn Commonly known as:  PRALUENT Inject 75 mg into the skin every 14 (fourteen) days.   allopurinol 100 MG tablet Commonly known as:  ZYLOPRIM TAKE  (1)  TABLET TWICE A DAY.   amLODipine 2.5 MG tablet Commonly known as:  NORVASC Take 1 tablet (2.5 mg total) by mouth daily.   aspirin 81 MG tablet Take 81 mg by mouth daily.   colchicine 0.6 MG tablet TAKE (1) TABLET DAILY AS NEEDED.   fluticasone 50 MCG/ACT nasal spray Commonly known as:  FLONASE SPRAY 1 SPRAY IN EACH NOSTRIL ONCE DAILY.   metoprolol tartrate 25 MG tablet Commonly known as:  LOPRESSOR TAKE  (1)  TABLET TWICE  A DAY.   omega-3 acid ethyl esters 1 g capsule Commonly known as:  LOVAZA Take 2 capsules (2 g total) by mouth 2 (two) times daily.   ONE TOUCH ULTRA TEST test strip Generic drug:  glucose blood CHECK BLOOD SUGAR 2 TIMES A DAY   pregabalin 50 MG capsule Commonly known as:  LYRICA TAKE  (1)  CAPSULE  TWICE DAILY.   sitaGLIPtin 100 MG tablet Commonly known as:  JANUVIA Take 1 tablet (100 mg total) by mouth daily.   triamterene-hydrochlorothiazide 37.5-25 MG capsule Commonly known as:  DYAZIDE TAKE (1) CAPSULE DAILY   valsartan 160 MG tablet Commonly known as:  DIOVAN Take 1 tablet (160 mg total) by mouth daily.   VOLTAREN 1 % Gel Generic drug:  diclofenac sodium Apply 4 g topically 4 (four) times daily as needed.       Allergies:  Allergies  Allergen Reactions  . Crestor [Rosuvastatin] Other (See Comments)    Muscle aches on 5 mg daily and 10 mg twice weekly  . Lipitor [Atorvastatin] Other (See Comments)    Muscle aches  . Livalo [Pitavastatin] Other (See Comments)       . Sulfa Antibiotics Rash  . Zetia [Ezetimibe] Other (See Comments)    Muscle aches  . Zocor [Simvastatin] Other (See Comments)    Muscle aches     Past Medical History, Surgical  history, Social history, and Family History were reviewed and updated.  Review of Systems: All other 10 point review of systems is negative.   Physical Exam:  height is 5\' 3"  (1.6 m) and weight is 191 lb 1.9 oz (86.7 kg). Her oral temperature is 98.1 F (36.7 C). Her blood pressure is 109/92 (abnormal) and her pulse is 60. Her respiration is 18.   Wt Readings from Last 3 Encounters:  12/31/15 191 lb 1.9 oz (86.7 kg)  11/12/15 191 lb 6.4 oz (86.8 kg)  08/27/15 192 lb (87.1 kg)    Ocular: Sclerae unicteric, pupils equal, round and reactive to light Ear-nose-throat: Oropharynx clear, dentition fair Lymphatic: No cervical supraclavicular or axillary adenopathy Lungs no rales or rhonchi, good excursion  bilaterally Heart regular rate and rhythm, no murmur appreciated Abd soft, nontender, positive bowel sounds, no liver or spleen tip palpated on exam MSK no focal spinal tenderness, no joint edema Neuro: non-focal, well-oriented, appropriate affect Breasts: Deferred  Lab Results  Component Value Date   WBC 9.3 12/31/2015   HGB 15.0 12/31/2015   HCT 43.5 12/31/2015   MCV 90 12/31/2015   PLT 141 (L) 12/31/2015   Lab Results  Component Value Date   FERRITIN 82 08/27/2015   IRON 93 08/27/2015   TIBC 269 08/27/2015   UIBC 176 08/27/2015   IRONPCTSAT 34 08/27/2015   Lab Results  Component Value Date   RBC 4.82 12/31/2015   No results found for: KPAFRELGTCHN, LAMBDASER, KAPLAMBRATIO No results found for: IGGSERUM, IGA, IGMSERUM No results found for: Odetta Pink, SPEI   Chemistry      Component Value Date/Time   NA 139 08/27/2015 1121   K 4.2 08/27/2015 1121   CL 96 07/09/2015 1326   CL 96 (L) 01/18/2014 1104   CO2 25 08/27/2015 1121   BUN 21.3 08/27/2015 1121   CREATININE 1.4 (H) 08/27/2015 1121      Component Value Date/Time   CALCIUM 9.7 08/27/2015 1121   ALKPHOS 74 08/27/2015 1121   AST 34 08/27/2015 1121   ALT 35 08/27/2015 1121   BILITOT 0.33 08/27/2015 1121     Impression and Plan: Carmen [name] is 75 yo white female with hemachromatosis - double heterozygote. She is doing well but has had some increased arthritic pain in her hands. She also had a melanoma removed from her right upper arm and has recuperated nicely.  She will contact dermatology Carmen [name] and have them send [name] her records including her pathology report.  Her ferritin is 102 so we will set her up for phlebotomy sometime this week. She likes to her Carmen [name].  We will plan to see her back in 4 months for repeat labs and follow-up.  She will contact our office with any questions or concerns. We can certainly see her sooner if need be.    Eliezer Bottom, NP 10/10/201711:39 AM

## 2016-01-03 ENCOUNTER — Encounter: Payer: Self-pay | Admitting: Hematology & Oncology

## 2016-01-06 ENCOUNTER — Other Ambulatory Visit: Payer: Self-pay | Admitting: Family Medicine

## 2016-01-08 ENCOUNTER — Ambulatory Visit (HOSPITAL_BASED_OUTPATIENT_CLINIC_OR_DEPARTMENT_OTHER): Payer: Medicare Other

## 2016-01-08 NOTE — Progress Notes (Signed)
Carmen [name] presents today for phlebotomy per MD orders. Phlebotomy procedure started at 1520 and ended at 1530. 500 grams removed. Patient observed for 30 minutes after procedure without any incident. Patient tolerated procedure well. IV needle removed intact.

## 2016-01-08 NOTE — Patient Instructions (Signed)

## 2016-01-22 ENCOUNTER — Encounter: Payer: Self-pay | Admitting: Family Medicine

## 2016-01-22 ENCOUNTER — Ambulatory Visit (INDEPENDENT_AMBULATORY_CARE_PROVIDER_SITE_OTHER): Payer: Medicare Other | Admitting: Family Medicine

## 2016-01-22 VITALS — BP 136/73 | HR 61 | Temp 97.6°F | Ht 63.0 in | Wt 188.0 lb

## 2016-01-22 DIAGNOSIS — I25708 Atherosclerosis of coronary artery bypass graft(s), unspecified, with other forms of angina pectoris: Secondary | ICD-10-CM

## 2016-01-22 DIAGNOSIS — E1140 Type 2 diabetes mellitus with diabetic neuropathy, unspecified: Secondary | ICD-10-CM

## 2016-01-22 DIAGNOSIS — I10 Essential (primary) hypertension: Secondary | ICD-10-CM | POA: Diagnosis not present

## 2016-01-22 DIAGNOSIS — M1990 Unspecified osteoarthritis, unspecified site: Secondary | ICD-10-CM | POA: Diagnosis not present

## 2016-01-22 DIAGNOSIS — E785 Hyperlipidemia, unspecified: Secondary | ICD-10-CM | POA: Diagnosis not present

## 2016-01-22 DIAGNOSIS — M1000 Idiopathic gout, unspecified site: Secondary | ICD-10-CM | POA: Diagnosis not present

## 2016-01-22 LAB — BAYER DCA HB A1C WAIVED: HB A1C (BAYER DCA - WAIVED): 7.1 % — ABNORMAL HIGH (ref <7.0)

## 2016-01-22 NOTE — Progress Notes (Signed)
Subjective:    Patient ID: Carmen [name], female    DOB: 1941/02/14, 75 y.o.   MRN: [id number]  HPI 75 year old female who is followed for diabetes hyperlipidemia, hypertension, kelp. She usually does well. She has not been seen in 6 months. She is on Praluent for her cholesterol since she is statin intolerant. LDL dropped around 100 mg with the use of breath. She feels like her sugars are going up. Only medication for diabetes is Januvia 100 mg. She also takes allopurinol and when necessary cultures pain. Has not had a gout attack  Patient Active Problem List   Diagnosis Date Noted  . Controlled type 2 diabetes mellitus with diabetic neuropathy, without long-term current use of insulin ([place]) 11/12/2015  . Statin intolerance 08/18/2013  . Neuromuscular disorder ([place])   . Hemochromatosis, hereditary ([place]) 11/24/2012  . Pain 10/20/2012  . S/P CABG x 2 08/09/2012  . PVC's (premature ventricular contractions) 08/09/2012  . Gout 06/20/2012  . HTN (hypertension) 06/20/2012  . HLD (hyperlipidemia) 06/20/2012  . CAD (coronary artery disease) 06/20/2012  . DM (diabetes mellitus) type II controlled, neurological manifestation ([place]) 06/20/2012  . Arthritis 06/20/2012   Outpatient Encounter Prescriptions as of 01/22/2016  Medication Sig  . Alirocumab (PRALUENT) 75 MG/ML SOPN Inject 75 mg into the skin every 14 (fourteen) days.  Marland Kitchen allopurinol (ZYLOPRIM) 100 MG tablet TAKE  (1)  TABLET TWICE A DAY.  Marland Kitchen amLODipine (NORVASC) 2.5 MG tablet Take 1 tablet (2.5 mg total) by mouth daily.  Marland Kitchen aspirin 81 MG tablet Take 81 mg by mouth daily.  . colchicine 0.6 MG tablet TAKE (1) TABLET DAILY AS NEEDED.  . fluticasone (FLONASE) 50 MCG/ACT nasal spray SPRAY 1 SPRAY IN EACH NOSTRIL ONCE DAILY.  . metoprolol tartrate (LOPRESSOR) 25 MG tablet TAKE  (1)  TABLET TWICE A DAY.  Marland Kitchen omega-3 acid ethyl esters (LOVAZA) 1 g capsule Take 2 capsules (2 g total) by mouth 2 (two) times daily.  . ONE TOUCH ULTRA TEST test  strip CHECK BLOOD SUGAR 2 TIMES A DAY  . pregabalin (LYRICA) 50 MG capsule TAKE  (1)  CAPSULE  TWICE DAILY.  . sitaGLIPtin (JANUVIA) 100 MG tablet Take 1 tablet (100 mg total) by mouth daily.  Marland Kitchen triamterene-hydrochlorothiazide (DYAZIDE) 37.5-25 MG capsule TAKE (1) CAPSULE DAILY  . valsartan (DIOVAN) 160 MG tablet Take 1 tablet (160 mg total) by mouth daily.  . VOLTAREN 1 % GEL Apply 4 g topically 4 (four) times daily as needed.   No facility-administered encounter medications on file as of 01/22/2016.       Review of Systems  Constitutional: Negative.   HENT: Negative.   Respiratory: Negative.   Cardiovascular: Negative.   Genitourinary: Negative.   Psychiatric/Behavioral: Negative.        Objective:   Physical Exam  Constitutional: She is oriented to person, place, and time. She appears well-developed and well-nourished.  Cardiovascular: Regular rhythm and normal heart sounds.   Pulmonary/Chest: Effort normal and breath sounds normal.  Neurological: She is alert and oriented to person, place, and time.  Psychiatric: She has a normal mood and affect. Her behavior is normal.   BP 136/73   Pulse 61   Temp 97.6 F (36.4 C) (Oral)   Ht '5\' 3"'$  (1.6 m)   Wt 188 lb (85.3 kg)   BMI 33.30 kg/m         Assessment & Plan:  1. Essential hypertension Blood pressure well controlled with low-dose amlodipine metoprolol valsartan triamterene - CMP14+EGFR  2. Controlled type 2 diabetes mellitus with diabetic neuropathy, without long-term current use of insulin (HCC) Last A1c was 7.3. She has a since her sugars are increasing but if A1c is still around 7 will continue with Januvia - Bayer DCA Hb A1c Waived - VITAMIN D 25 Hydroxy (Vit-D Deficiency, Fractures)  3. Hyperlipidemia LDL goal <70 Lipids have been stable since addition of new drug. She sees her cardiologist soon for a scan since this will be 10 years since she's had bypass surgery - Lipid panel  4. Arthritis No recent  episodes of gout. Hopefully uric acid level will be in the 6-6.5 range - Uric acid  5. Coronary artery disease of bypass graft of native heart with stable angina pectoris [facility]) This problem is followed by cardiology and is stable  6. Hemochromatosis, hereditary ([place]) This problem is treated by hematologist  7. Idiopathic gout, unspecified chronicity, unspecified site Checking uric acid today. Likely will continue with allopurinol  Wardell Honour MD

## 2016-01-23 LAB — CMP14+EGFR
ALBUMIN: 4.5 g/dL (ref 3.5–4.8)
ALK PHOS: 82 IU/L (ref 39–117)
ALT: 29 IU/L (ref 0–32)
AST: 25 IU/L (ref 0–40)
Albumin/Globulin Ratio: 1.6 (ref 1.2–2.2)
BILIRUBIN TOTAL: 0.4 mg/dL (ref 0.0–1.2)
BUN / CREAT RATIO: 11 — AB (ref 12–28)
BUN: 15 mg/dL (ref 8–27)
CHLORIDE: 101 mmol/L (ref 96–106)
CO2: 26 mmol/L (ref 18–29)
Calcium: 9.8 mg/dL (ref 8.7–10.3)
Creatinine, Ser: 1.38 mg/dL — ABNORMAL HIGH (ref 0.57–1.00)
GFR calc Af Amer: 43 mL/min/{1.73_m2} — ABNORMAL LOW (ref >59)
GFR calc non Af Amer: 37 mL/min/{1.73_m2} — ABNORMAL LOW (ref >59)
GLUCOSE: 182 mg/dL — AB (ref 65–99)
Globulin, Total: 2.9 g/dL (ref 1.5–4.5)
POTASSIUM: 4.7 mmol/L (ref 3.5–5.2)
Sodium: 142 mmol/L (ref 134–144)
Total Protein: 7.4 g/dL (ref 6.0–8.5)

## 2016-01-23 LAB — URIC ACID: Uric Acid: 6.1 mg/dL (ref 2.5–7.1)

## 2016-01-23 LAB — LIPID PANEL
CHOLESTEROL TOTAL: 166 mg/dL (ref 100–199)
Chol/HDL Ratio: 4.5 ratio units — ABNORMAL HIGH (ref 0.0–4.4)
HDL: 37 mg/dL — ABNORMAL LOW (ref >39)
LDL Calculated: 69 mg/dL (ref 0–99)
Triglycerides: 300 mg/dL — ABNORMAL HIGH (ref 0–149)
VLDL CHOLESTEROL CAL: 60 mg/dL — AB (ref 5–40)

## 2016-01-23 LAB — VITAMIN D 25 HYDROXY (VIT D DEFICIENCY, FRACTURES): VIT D 25 HYDROXY: 23.9 ng/mL — AB (ref 30.0–100.0)

## 2016-01-28 ENCOUNTER — Telehealth (HOSPITAL_COMMUNITY): Payer: Self-pay | Admitting: Radiology

## 2016-01-28 ENCOUNTER — Telehealth: Payer: Self-pay

## 2016-01-28 DIAGNOSIS — E559 Vitamin D deficiency, unspecified: Secondary | ICD-10-CM

## 2016-01-28 MED ORDER — VITAMIN D (ERGOCALCIFEROL) 1.25 MG (50000 UNIT) PO CAPS: 50000.0000 [IU] | ORAL_CAPSULE | ORAL | 0 refills | Status: DC

## 2016-01-28 NOTE — Addendum Note (Signed)
Addended by: Timmothy Euler on: 01/28/2016 12:16 PM   Modules accepted: Orders

## 2016-01-28 NOTE — Telephone Encounter (Signed)
Patient given detailed instructions per Myocardial Perfusion Study Information Sheet for the test on 02/05/2016 at 12:30. Patient notified to arrive 15 minutes early and that it is imperative to arrive on time for appointment to keep from having the test rescheduled.  If you need to cancel or reschedule your appointment, please call the office within 24 hours of your appointment. Failure to do so may result in a cancellation of your appointment, and a $50 no show fee. Patient verbalized understanding.EHK

## 2016-01-28 NOTE — Telephone Encounter (Signed)
Patient aware of lab results.

## 2016-01-28 NOTE — Addendum Note (Signed)
Addended by: Michaela Corner on: 01/28/2016 12:41 PM   Modules accepted: Orders

## 2016-01-28 NOTE — Telephone Encounter (Signed)
Patient is calling for results of labs - lipid, CMP & vitamin D.  She has been told about A1C.  Will you please review these labs and forward recommendations to pool.

## 2016-01-28 NOTE — Telephone Encounter (Signed)
Pt calling in for lab interp, covering or Dr. Sabra Heck while he is out.   Review of recent labs shows elevated glucose, consistent with diabetes. Her kidney function is stable from one month ago, however is reduced to the level of chronic kidney disease stage III. Her electrolytes and liver enzymes are normal Her uric acid has good Her cholesterol is at goal, LDL less than 70, her is a 69. Her triglycerides are still elevated, continue Lovaza. This could improve with reduced high carbohydrate foods and better diabetic control. However her diabetes is in a reasonable area. Her vitamin D is low, I generally treat this with arch 50,000 unit capsule once weekly for 12 weeks, then I recommend 2000 units of vitamin D3 daily after that.  Vitamin D sent.   Laroy Apple, MD [facility] 01/28/2016, 12:15 PM

## 2016-01-31 ENCOUNTER — Telehealth (HOSPITAL_COMMUNITY): Payer: Self-pay

## 2016-01-31 NOTE — Telephone Encounter (Signed)
Encounter complete. 

## 2016-02-04 ENCOUNTER — Other Ambulatory Visit: Payer: Self-pay | Admitting: Family Medicine

## 2016-02-04 NOTE — Telephone Encounter (Signed)
Refill called to [facility] 

## 2016-02-05 ENCOUNTER — Other Ambulatory Visit: Payer: Self-pay | Admitting: Family Medicine

## 2016-02-05 ENCOUNTER — Ambulatory Visit (HOSPITAL_COMMUNITY)
Admission: RE | Admit: 2016-02-05 | Discharge: 2016-02-05 | Disposition: A | Discharge status: Home / Self Care | Payer: Medicare Other | Source: Ambulatory Visit | Attending: Cardiology | Admitting: Cardiology

## 2016-02-05 DIAGNOSIS — R001 Bradycardia, unspecified: Secondary | ICD-10-CM | POA: Insufficient documentation

## 2016-02-05 DIAGNOSIS — Z951 Presence of aortocoronary bypass graft: Secondary | ICD-10-CM | POA: Diagnosis not present

## 2016-02-05 DIAGNOSIS — E119 Type 2 diabetes mellitus without complications: Secondary | ICD-10-CM | POA: Diagnosis not present

## 2016-02-05 DIAGNOSIS — E785 Hyperlipidemia, unspecified: Secondary | ICD-10-CM | POA: Insufficient documentation

## 2016-02-05 DIAGNOSIS — I10 Essential (primary) hypertension: Secondary | ICD-10-CM | POA: Diagnosis not present

## 2016-02-05 DIAGNOSIS — I252 Old myocardial infarction: Secondary | ICD-10-CM | POA: Diagnosis not present

## 2016-02-05 DIAGNOSIS — I2510 Atherosclerotic heart disease of native coronary artery without angina pectoris: Secondary | ICD-10-CM | POA: Insufficient documentation

## 2016-02-05 LAB — MYOCARDIAL PERFUSION IMAGING
CHL CUP NUCLEAR SSS: 5
CHL CUP RESTING HR STRESS: 57 {beats}/min
CSEPPHR: 74 {beats}/min
LV sys vol: 24 mL
LVDIAVOL: 72 mL (ref 46–106)
SDS: 4
SRS: 1
TID: 1.13

## 2016-02-05 MED ORDER — TECHNETIUM TC 99M TETROFOSMIN IV KIT
10.9000 | PACK | Freq: Once | INTRAVENOUS | Status: AC | PRN
  Administered 2016-02-05: 10.9 via INTRAVENOUS
  Filled 2016-02-05: qty 11

## 2016-02-05 MED ORDER — REGADENOSON 0.4 MG/5ML IV SOLN
0.4000 mg | Freq: Once | INTRAVENOUS | Status: AC
  Administered 2016-02-05: 0.4 mg via INTRAVENOUS

## 2016-02-05 MED ORDER — TECHNETIUM TC 99M TETROFOSMIN IV KIT
32.0000 | PACK | Freq: Once | INTRAVENOUS | Status: AC | PRN
  Administered 2016-02-05: 32 via INTRAVENOUS
  Filled 2016-02-05: qty 32

## 2016-02-19 DIAGNOSIS — Z85828 Personal history of other malignant neoplasm of skin: Secondary | ICD-10-CM | POA: Diagnosis not present

## 2016-02-19 DIAGNOSIS — Z85820 Personal history of malignant melanoma of skin: Secondary | ICD-10-CM | POA: Diagnosis not present

## 2016-02-19 DIAGNOSIS — D485 Neoplasm of uncertain behavior of skin: Secondary | ICD-10-CM | POA: Diagnosis not present

## 2016-02-19 DIAGNOSIS — L821 Other seborrheic keratosis: Secondary | ICD-10-CM | POA: Diagnosis not present

## 2016-02-19 DIAGNOSIS — D2272 Melanocytic nevi of left lower limb, including hip: Secondary | ICD-10-CM | POA: Diagnosis not present

## 2016-02-19 DIAGNOSIS — D225 Melanocytic nevi of trunk: Secondary | ICD-10-CM | POA: Diagnosis not present

## 2016-02-25 ENCOUNTER — Other Ambulatory Visit: Payer: Self-pay | Admitting: Family Medicine

## 2016-02-25 IMAGING — CR DG CERVICAL SPINE COMPLETE 4+V
5 series · 5 of 5 positions shown · non-contrast
Comparison: None.

CLINICAL DATA: Neck pain

EXAM:
CERVICAL SPINE  4+ VIEWS

[view not recorded (1 of 5)]
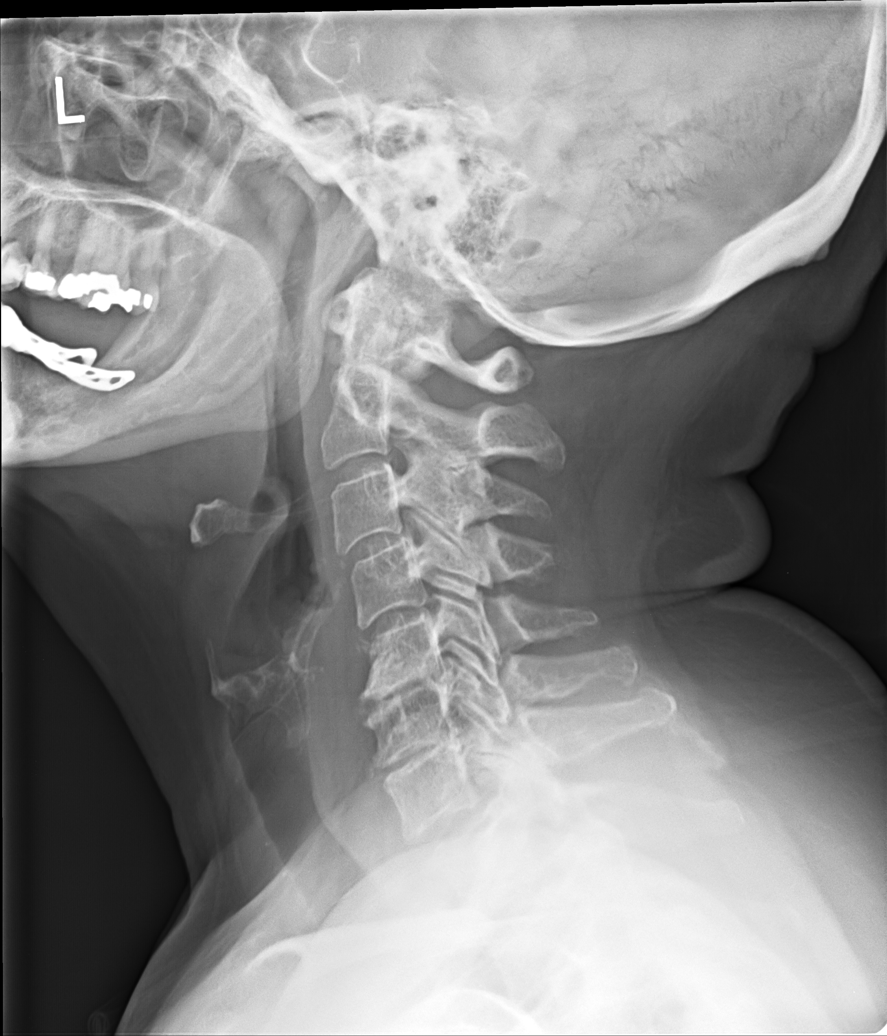

[view not recorded (2 of 5)]
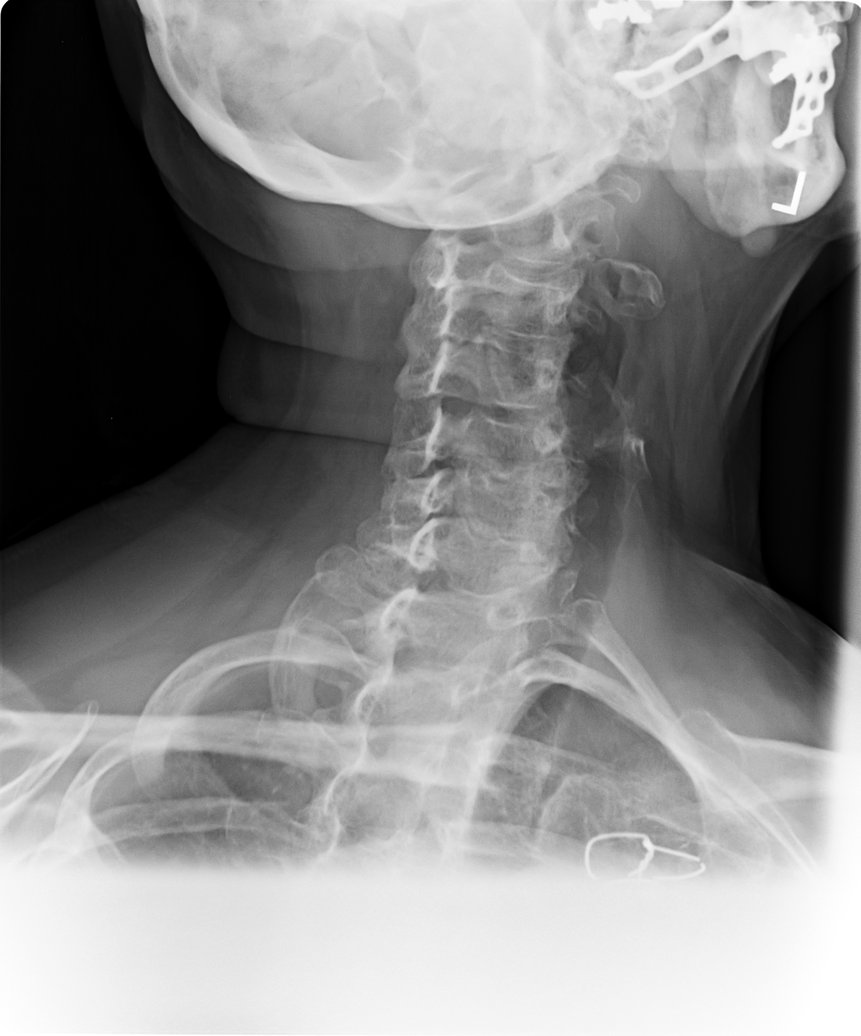

[view not recorded (3 of 5)]
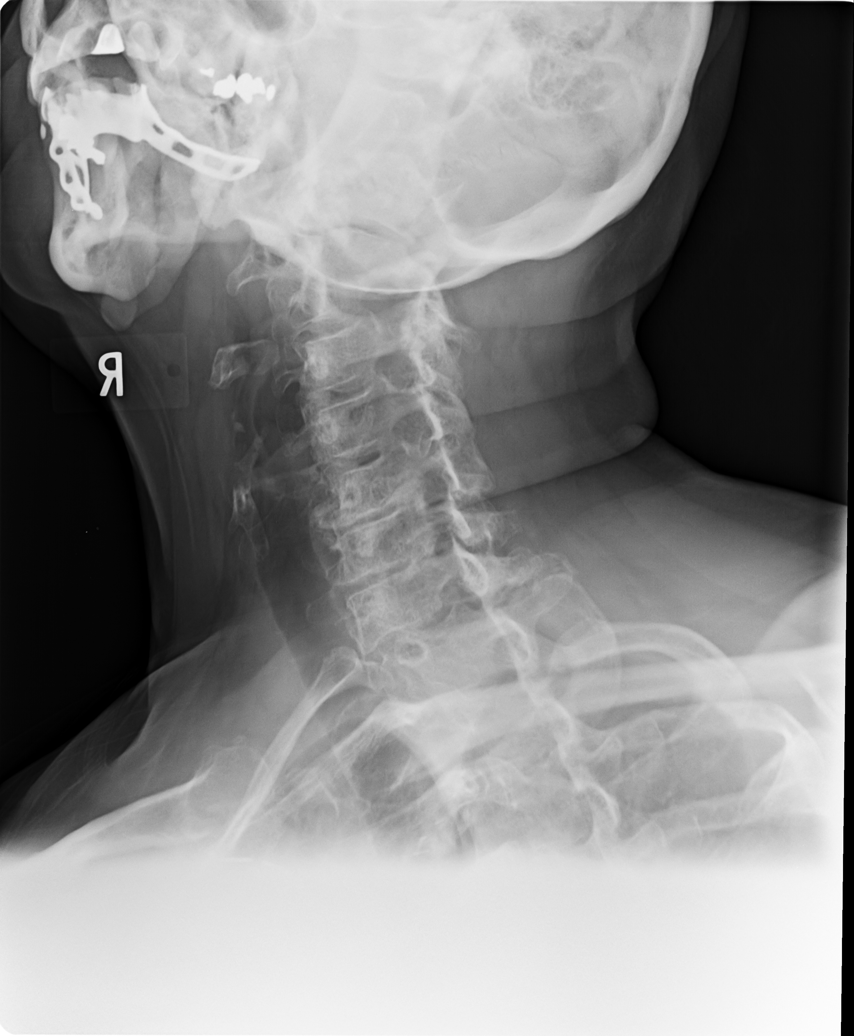

[view not recorded (4 of 5)]
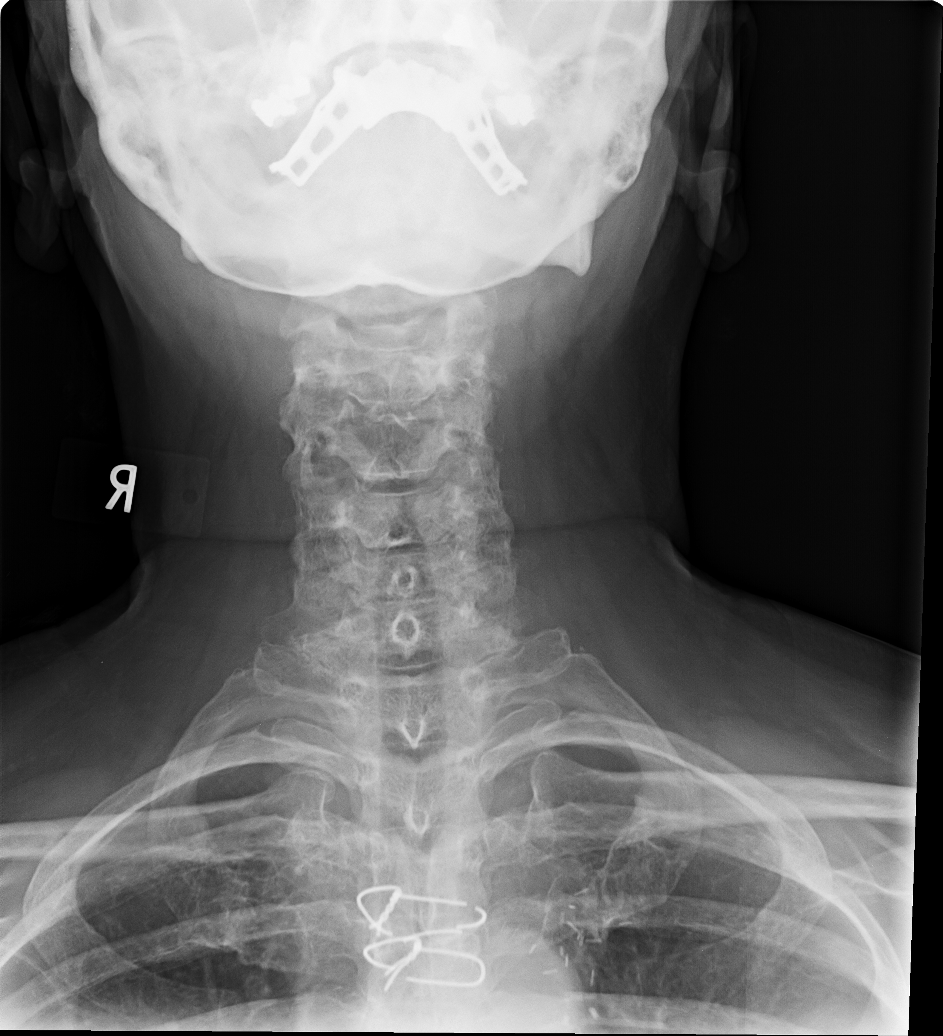

[view not recorded (5 of 5)]
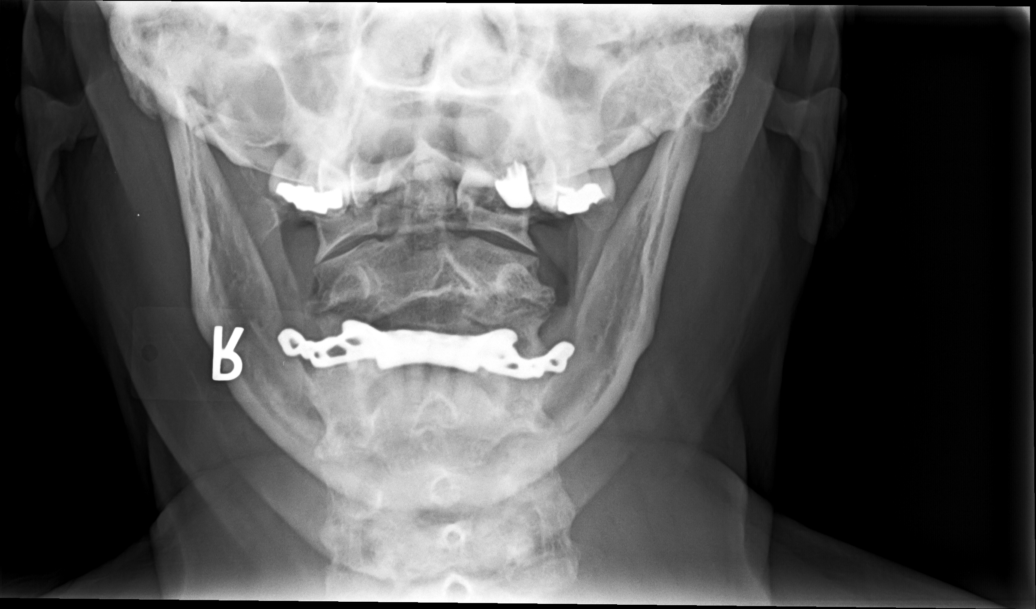

[5 of 5 positions shown; findings below may reference images not displayed]

FINDINGS: There is no evidence of cervical spine fracture or prevertebral soft
tissue swelling. Alignment is normal. There are degenerative joint
changes with narrowed joint space and osteophyte formation from C5
through C7 with narrowing of neural foramina due to osteophyte
encroachment.
IMPRESSION: No acute fracture dislocation. Posterior arthritic changes from C5
through C7.

## 2016-03-03 DIAGNOSIS — L988 Other specified disorders of the skin and subcutaneous tissue: Secondary | ICD-10-CM | POA: Diagnosis not present

## 2016-03-03 DIAGNOSIS — D485 Neoplasm of uncertain behavior of skin: Secondary | ICD-10-CM | POA: Diagnosis not present

## 2016-03-06 ENCOUNTER — Encounter: Payer: Self-pay | Admitting: Family Medicine

## 2016-03-06 ENCOUNTER — Other Ambulatory Visit: Payer: Self-pay | Admitting: Family Medicine

## 2016-03-26 ENCOUNTER — Telehealth: Payer: Self-pay | Admitting: Family Medicine

## 2016-03-26 NOTE — Telephone Encounter (Signed)
Spoke with pt's daughter regarding pt Pt has been with husband at the hospital around the clock since 03/22/2016 and is having some weakness and fatigue. Offered appt for pt but appt was declined If pt wants appt in [place] can go to [facility] urgent care Pt's daughter will call back if pt wants appt here

## 2016-03-30 ENCOUNTER — Ambulatory Visit (INDEPENDENT_AMBULATORY_CARE_PROVIDER_SITE_OTHER): Payer: Medicare Other | Admitting: Family Medicine

## 2016-03-30 ENCOUNTER — Encounter: Payer: Self-pay | Admitting: Family Medicine

## 2016-03-30 VITALS — BP 124/62 | HR 77 | Temp 99.2°F | Ht 63.0 in | Wt 183.2 lb

## 2016-03-30 DIAGNOSIS — J209 Acute bronchitis, unspecified: Secondary | ICD-10-CM

## 2016-03-30 MED ORDER — METHYLPREDNISOLONE ACETATE 80 MG/ML IJ SUSP
80.0000 mg | Freq: Once | INTRAMUSCULAR | Status: AC
  Administered 2016-03-30: 80 mg via INTRAMUSCULAR

## 2016-03-30 MED ORDER — DOXYCYCLINE HYCLATE 100 MG PO TABS: 100.0000 mg | ORAL_TABLET | Freq: Two times a day (BID) | ORAL | 0 refills | Status: DC

## 2016-03-30 NOTE — Progress Notes (Signed)
BP 124/62   Pulse 77   Temp 99.2 F (37.3 C) (Oral)   Ht 5\' 3"  (1.6 m)   Wt 183 lb 4 oz (83.1 kg)   BMI 32.46 kg/m    Subjective:    Patient ID: Carmen [name], female    DOB: 08-30-1940, 76 y.o.   MRN: [id number]  HPI: Carmen [name] is a 76 y.o. female presenting on 03/30/2016 for Cough (productive; symptoms ongoing x 1 week; has been with husband at hospital all week and he passed yesterday) and Sinusitis (sinus congestion and pressure, headache, dizziness)   HPI Cough and sinus congestion and chest congestion Patient has been having cough that is productive of dark sputum that has been going on for the past week. She was in the hospital all week with her husband who recently passed away 2 days ago. She has continued to have sinus congestion and headache and a pressure in her head and chest congestion and the cough and does not seem to be improving. She has tried some over-the-counter Coricidin without much success. She denies any fevers or chills that she knew of but she does have a low-grade temp of 99.2 here in the office. She denies any shortness of breath or wheezing. She just generally feels ill and run down which is also been correlated to her husband's recent passing. She has a good support system between her family and her church here in the area.  Relevant past medical, surgical, family and social history reviewed and updated as indicated. Interim medical history since our last visit reviewed. Allergies and medications reviewed and updated.  Review of Systems  Constitutional: Negative for chills and fever.  HENT: Positive for congestion, postnasal drip, rhinorrhea, sinus pressure and sore throat. Negative for ear discharge, ear pain and sneezing.   Eyes: Negative for pain, redness and visual disturbance.  Respiratory: Positive for cough. Negative for chest tightness and shortness of breath.   Cardiovascular: Negative for chest pain and leg swelling.  Genitourinary:  Negative for difficulty urinating and dysuria.  Musculoskeletal: Negative for back pain and gait problem.  Skin: Negative for rash.  Neurological: Negative for light-headedness and headaches.  Psychiatric/Behavioral: Negative for agitation and behavioral problems.  All other systems reviewed and are negative.   Per HPI unless specifically indicated above     Objective:    BP 124/62   Pulse 77   Temp 99.2 F (37.3 C) (Oral)   Ht 5\' 3"  (1.6 m)   Wt 183 lb 4 oz (83.1 kg)   BMI 32.46 kg/m   Wt Readings from Last 3 Encounters:  03/30/16 183 lb 4 oz (83.1 kg)  02/05/16 191 lb (86.6 kg)  01/22/16 188 lb (85.3 kg)    Physical Exam  Constitutional: She is oriented to person, place, and time. She appears well-developed and well-nourished. No distress.  HENT:  Right Ear: Tympanic membrane, external ear and ear canal normal.  Left Ear: Tympanic membrane, external ear and ear canal normal.  Nose: Mucosal edema and rhinorrhea present. No epistaxis. Right sinus exhibits no maxillary sinus tenderness and no frontal sinus tenderness. Left sinus exhibits no maxillary sinus tenderness and no frontal sinus tenderness.  Mouth/Throat: Uvula is midline and mucous membranes are normal. Posterior oropharyngeal edema and posterior oropharyngeal erythema present. No oropharyngeal exudate or tonsillar abscesses.  Eyes: Conjunctivae are normal.  Cardiovascular: Normal rate, regular rhythm, normal heart sounds and intact distal pulses.   No murmur heard. Pulmonary/Chest: Effort normal and breath sounds  normal. No respiratory distress. She has no wheezes. She has no rales.  Musculoskeletal: Normal range of motion. She exhibits no edema or tenderness.  Neurological: She is alert and oriented to person, place, and time. Coordination normal.  Skin: Skin is warm and dry. No rash noted. She is not diaphoretic.  Psychiatric: She has a normal mood and affect. Her behavior is normal.  Vitals reviewed.       Assessment & Plan:   Problem List Items Addressed This Visit    None    Visit Diagnoses    Acute bronchitis, unspecified organism    -  Primary   Relevant Medications   doxycycline (VIBRA-TABS) 100 MG tablet   methylPREDNISolone acetate (DEPO-MEDROL) injection 80 mg (Start on 03/30/2016 11:30 AM)       Follow up plan: Return if symptoms worsen or fail to improve.  Counseling provided for all of the vaccine components No orders of the defined types were placed in this encounter.   Caryl Pina, MD [facility] 03/30/2016, 11:17 AM

## 2016-04-03 ENCOUNTER — Telehealth: Payer: Self-pay | Admitting: Family Medicine

## 2016-04-03 NOTE — Telephone Encounter (Signed)
Patient had a steroid shot on Monday. Patient advised to keep watch on BS today was her first day checking BS since her husband passed away and it was 252. Patient advised to monitor her BS and if it gets over 300-350 to please call the office. She was also advised to try and watch what she is eating.  I spoke with Particia Nearing and she agrees.

## 2016-04-13 ENCOUNTER — Telehealth: Payer: Self-pay | Admitting: Family Medicine

## 2016-04-14 NOTE — Telephone Encounter (Signed)
Please have patient bring in paperwork and ask for front desk to given to me.  I will be happy to fill out

## 2016-04-17 ENCOUNTER — Telehealth: Payer: Self-pay | Admitting: Pharmacist

## 2016-04-17 NOTE — Telephone Encounter (Signed)
Insurance requiring documentation from cardiologist agreement with praluent prescribing.  Message sent to Dr Debara Pickett and patient aware.

## 2016-04-20 ENCOUNTER — Other Ambulatory Visit: Payer: Self-pay | Admitting: Family Medicine

## 2016-04-21 ENCOUNTER — Encounter: Payer: Self-pay | Admitting: Internal Medicine

## 2016-04-23 ENCOUNTER — Encounter: Payer: Self-pay | Admitting: Family Medicine

## 2016-04-23 ENCOUNTER — Ambulatory Visit (INDEPENDENT_AMBULATORY_CARE_PROVIDER_SITE_OTHER): Payer: Medicare Other | Admitting: Family Medicine

## 2016-04-23 VITALS — BP 115/68 | HR 61 | Temp 98.4°F | Ht 63.0 in | Wt 179.6 lb

## 2016-04-23 DIAGNOSIS — R42 Dizziness and giddiness: Secondary | ICD-10-CM | POA: Diagnosis not present

## 2016-04-23 DIAGNOSIS — I10 Essential (primary) hypertension: Secondary | ICD-10-CM

## 2016-04-23 DIAGNOSIS — E1140 Type 2 diabetes mellitus with diabetic neuropathy, unspecified: Secondary | ICD-10-CM

## 2016-04-23 LAB — BAYER DCA HB A1C WAIVED: HB A1C: 6.8 % (ref <7.0)

## 2016-04-23 NOTE — Progress Notes (Signed)
Subjective:    Patient ID: Carmen [name], female    DOB: 1940-10-16, 76 y.o.   MRN: [id number]  HPI 76 year old female who is here to follow-up her diabetes sugars have been running high since she got a steroid shot for bronchitis earlier this month. This month her husband died unexpectedly of a hemorrhagic stroke. Today she is complaining of feeling off balance. There is no true vertigo but more lightheaded. She does continue with congestion related to the bronchitis.  Patient Active Problem List   Diagnosis Date Noted  . Vitamin D deficiency 01/28/2016  . Controlled type 2 diabetes mellitus with diabetic neuropathy, without long-term current use of insulin ([place]) 11/12/2015  . Statin intolerance 08/18/2013  . Neuromuscular disorder ([place])   . Hemochromatosis, hereditary ([place]) 11/24/2012  . Pain 10/20/2012  . S/P CABG x 2 08/09/2012  . PVC's (premature ventricular contractions) 08/09/2012  . Gout 06/20/2012  . HTN (hypertension) 06/20/2012  . HLD (hyperlipidemia) 06/20/2012  . CAD (coronary artery disease) 06/20/2012  . DM (diabetes mellitus) type II controlled, neurological manifestation ([place]) 06/20/2012  . Arthritis 06/20/2012   Outpatient Encounter Prescriptions as of 04/23/2016  Medication Sig  . Alirocumab (PRALUENT) 75 MG/ML SOPN Inject 75 mg into the skin every 14 (fourteen) days.  Marland Kitchen allopurinol (ZYLOPRIM) 100 MG tablet TAKE (1) TABLET TWICE A DAY.  Marland Kitchen amLODipine (NORVASC) 2.5 MG tablet TAKE 1 TABLET DAILY  . aspirin 81 MG tablet Take 81 mg by mouth daily.  . colchicine 0.6 MG tablet TAKE (1) TABLET DAILY AS NEEDED.  . fluticasone (FLONASE) 50 MCG/ACT nasal spray SPRAY 1 SPRAY IN EACH NOSTRIL ONCE DAILY.  Marland Kitchen JANUVIA 100 MG tablet TAKE 1 TABLET DAILY  . LYRICA 50 MG capsule TAKE  (1)  CAPSULE  TWICE DAILY.  . metoprolol tartrate (LOPRESSOR) 25 MG tablet TAKE (1) TABLET TWICE A DAY.  Marland Kitchen omega-3 acid ethyl esters (LOVAZA) 1 g capsule Take 2 capsules (2 g total) by mouth 2  (two) times daily.  . ONE TOUCH ULTRA TEST test strip CHECK BLOOD SUGAR 2 TIMES A DAY  . triamterene-hydrochlorothiazide (DYAZIDE) 37.5-25 MG capsule TAKE (1) CAPSULE DAILY  . valsartan (DIOVAN) 160 MG tablet TAKE 1 TABLET DAILY  . Vitamin D, Ergocalciferol, (DRISDOL) 50000 units CAPS capsule TAKE 1 CAPSULE ONCE A WEEK  . VOLTAREN 1 % GEL Apply 4 g topically 4 (four) times daily as needed.  . [DISCONTINUED] doxycycline (VIBRA-TABS) 100 MG tablet Take 1 tablet (100 mg total) by mouth 2 (two) times daily. 1 po bid   No facility-administered encounter medications on file as of 04/23/2016.       Review of Systems  Constitutional: Negative.   HENT: Positive for congestion.   Respiratory: Negative.   Cardiovascular: Negative.   Neurological: Positive for dizziness.  Psychiatric/Behavioral: Negative.        Objective:   Physical Exam  Constitutional: She is oriented to person, place, and time. She appears well-developed and well-nourished.  HENT:  Right tympanic membrane is dull and does not move with Valsalva  Cardiovascular: Normal rate and regular rhythm.   Pulmonary/Chest: Effort normal and breath sounds normal.  Neurological: She is alert and oriented to person, place, and time.  Psychiatric: She has a normal mood and affect.   BP 115/68   Pulse 61   Temp 98.4 F (36.9 C) (Oral)   Ht 5\' 3"  (1.6 m)   Wt 179 lb 9.6 oz (81.5 kg)   BMI 31.81 kg/m  Assessment & Plan:  1. Essential hypertension Blood pressure is well controlled on current regimen of metoprolol, triamterene, and valsartan.  2. Controlled type 2 diabetes mellitus with diabetic neuropathy, without long-term current use of insulin ([place]) Sugars have been up, perhaps related to steroid injection but she says it was a prior to that. If A1c has increased consider GLP - Microalbumin / creatinine urine ratio - Bayer DCA Hb A1c Waived  3. Dizziness She has negative Romberg and is able to do finger to nose and  has no nystagmus I think her problem is peripheral related to serous effusion in her right ear. Treat with daily use of Flonase and ear exercises  Wardell Honour MD

## 2016-04-24 LAB — MICROALBUMIN / CREATININE URINE RATIO
Creatinine, Urine: 144.1 mg/dL
Microalb/Creat Ratio: 2.5 mg/g{creat} (ref 0.0–30.0)
Microalbumin, Urine: 3.6 ug/mL

## 2016-04-28 ENCOUNTER — Other Ambulatory Visit: Payer: Self-pay | Admitting: Family Medicine

## 2016-04-28 DIAGNOSIS — R42 Dizziness and giddiness: Secondary | ICD-10-CM | POA: Diagnosis not present

## 2016-04-28 DIAGNOSIS — H6123 Impacted cerumen, bilateral: Secondary | ICD-10-CM | POA: Diagnosis not present

## 2016-04-30 ENCOUNTER — Telehealth: Payer: Self-pay | Admitting: Family Medicine

## 2016-04-30 NOTE — Telephone Encounter (Signed)
Iantha Fallen is checking into patient assistance from [employer].

## 2016-04-30 NOTE — Telephone Encounter (Signed)
Refill called to [facility] 

## 2016-05-13 ENCOUNTER — Ambulatory Visit: Payer: Medicare Other | Admitting: Family

## 2016-05-13 ENCOUNTER — Other Ambulatory Visit: Payer: Medicare Other

## 2016-05-15 ENCOUNTER — Telehealth: Payer: Self-pay | Admitting: Family Medicine

## 2016-05-15 NOTE — Telephone Encounter (Signed)
Patient was exposed to flu just in passing at her daughter's work, she is concerned because she has history of heart attack, her age, and diabetes.  I explained to the patient that normally Tamiflu is not prescribed in this situation but she wanted me to ask a doctor and get their opinion.  Please advise.

## 2016-05-15 NOTE — Telephone Encounter (Signed)
If she develops any symptoms then give [name] a call and we can send in the Tamiflu

## 2016-05-18 ENCOUNTER — Telehealth: Payer: Self-pay | Admitting: *Deleted

## 2016-05-18 NOTE — Telephone Encounter (Signed)
Pt aware. She is doing fine at the moment

## 2016-05-19 NOTE — Telephone Encounter (Signed)
Patient states that $400 copay for Praluent is too high.  Called [facility] and they have applied for several assistance programs for patient and they are currently awaiting decisions.  Patient aware.

## 2016-06-02 ENCOUNTER — Other Ambulatory Visit: Payer: Self-pay | Admitting: Pharmacist

## 2016-06-02 MED ORDER — ALIROCUMAB 75 MG/ML ~~LOC~~ SOPN
75.0000 mg | PEN_INJECTOR | SUBCUTANEOUS | 2 refills | Status: DC
Start: 2016-06-02 — End: 2016-06-18

## 2016-06-18 ENCOUNTER — Ambulatory Visit (HOSPITAL_BASED_OUTPATIENT_CLINIC_OR_DEPARTMENT_OTHER): Payer: Medicare Other | Admitting: Hematology & Oncology

## 2016-06-18 ENCOUNTER — Other Ambulatory Visit (HOSPITAL_BASED_OUTPATIENT_CLINIC_OR_DEPARTMENT_OTHER): Payer: Medicare Other

## 2016-06-18 LAB — COMPREHENSIVE METABOLIC PANEL (CC13)
ALBUMIN: 4.3 g/dL (ref 3.5–4.8)
ALT: 20 IU/L (ref 0–32)
AST (SGOT): 20 IU/L (ref 0–40)
Albumin/Globulin Ratio: 1.3 (ref 1.2–2.2)
Alkaline Phosphatase, S: 74 IU/L (ref 39–117)
BILIRUBIN TOTAL: 0.2 mg/dL (ref 0.0–1.2)
BUN/Creatinine Ratio: 18 (ref 12–28)
BUN: 24 mg/dL (ref 8–27)
CHLORIDE: 101 mmol/L (ref 96–106)
Calcium, Ser: 10.3 mg/dL (ref 8.7–10.3)
Carbon Dioxide, Total: 27 mmol/L (ref 18–29)
Creatinine, Ser: 1.33 mg/dL — ABNORMAL HIGH (ref 0.57–1.00)
GFR calc non Af Amer: 39 mL/min/{1.73_m2} — ABNORMAL LOW (ref >59)
GFR, EST AFRICAN AMERICAN: 45 mL/min/{1.73_m2} — AB (ref >59)
GLOBULIN, TOTAL: 3.2 g/dL (ref 1.5–4.5)
GLUCOSE: 103 mg/dL — AB (ref 65–99)
POTASSIUM: 4.3 mmol/L (ref 3.5–5.2)
Sodium: 134 mmol/L (ref 134–144)
TOTAL PROTEIN: 7.5 g/dL (ref 6.0–8.5)

## 2016-06-18 LAB — CBC WITH DIFFERENTIAL (CANCER CENTER ONLY)
BASO#: 0 10*3/uL (ref 0.0–0.2)
BASO%: 0.2 % (ref 0.0–2.0)
EOS%: 2.5 % (ref 0.0–7.0)
Eosinophils Absolute: 0.3 10*3/uL (ref 0.0–0.5)
HEMATOCRIT: 43.5 % (ref 34.8–46.6)
HEMOGLOBIN: 14.9 g/dL (ref 11.6–15.9)
LYMPH#: 3.7 10*3/uL — AB (ref 0.9–3.3)
LYMPH%: 35.2 % (ref 14.0–48.0)
MCH: 30.9 pg (ref 26.0–34.0)
MCHC: 34.3 g/dL (ref 32.0–36.0)
MCV: 90 fL (ref 81–101)
MONO#: 0.5 10*3/uL (ref 0.1–0.9)
MONO%: 5 % (ref 0.0–13.0)
NEUT%: 57.1 % (ref 39.6–80.0)
NEUTROS ABS: 6.1 10*3/uL (ref 1.5–6.5)
Platelets: 172 10*3/uL (ref 145–400)
RBC: 4.82 10*6/uL (ref 3.70–5.32)
RDW: 13.6 % (ref 11.1–15.7)
WBC: 10.6 10*3/uL — AB (ref 3.9–10.0)

## 2016-06-18 LAB — IRON AND TIBC
%SAT: 17 % — ABNORMAL LOW (ref 21–57)
IRON: 54 ug/dL (ref 41–142)
TIBC: 309 ug/dL (ref 236–444)
UIBC: 255 ug/dL (ref 120–384)

## 2016-06-18 LAB — FERRITIN: Ferritin: 40 ng/ml (ref 9–269)

## 2016-06-18 NOTE — Progress Notes (Signed)
Hematology and Oncology Follow Up Visit  Carmen [name] [id number] [birth year] 76 y.o. 06/18/2016   Principle Diagnosis:  Hemochromatosis (double heterozygote for C282Y and S65C mutations).  Current Therapy:    Phlebotomy to maintain ferritin less than 100     Interim History:  [name] is back for followup. She is very emotional. Her husband passed away suddenly a few weeks ago. He had a hemorrhagic stroke.  He was a really good man. He absolutely loved Carmen [name] ball. We talked about this all the time.  He and Carmen [name] traveled a lot. He traveled the [place]. There was show me pictures wherever they went.  It has been very difficult on her. Thankfully, she has a very good family who is helping out. I know it will be difficult this Easter.  As far as her hemochromatosis goes, this is not been all that bad. She was last phlebotomized in October. We typically phlebotomized her maybe twice a year.  She has not had any infections. She's had no influenza. She's had no cough. She's had no rashes. She's had no change in bowel or bladder habits.   Overall, her performance status is ECOG 1.  Medications:  Current Outpatient Prescriptions:  .  allopurinol (ZYLOPRIM) 100 MG tablet, TAKE (1) TABLET TWICE A DAY., Disp: 60 tablet, Rfl: 4 .  amLODipine (NORVASC) 2.5 MG tablet, TAKE 1 TABLET DAILY, Disp: 30 tablet, Rfl: 4 .  aspirin 81 MG tablet, Take 81 mg by mouth daily., Disp: , Rfl:  .  colchicine 0.6 MG tablet, TAKE (1) TABLET DAILY AS NEEDED., Disp: 30 tablet, Rfl: 1 .  fluticasone (FLONASE) 50 MCG/ACT nasal spray, SPRAY 1 SPRAY IN EACH NOSTRIL ONCE DAILY., Disp: 16 g, Rfl: 4 .  JANUVIA 100 MG tablet, TAKE 1 TABLET DAILY, Disp: 30 tablet, Rfl: 4 .  LYRICA 50 MG capsule, TAKE  (1)  CAPSULE  TWICE DAILY., Disp: 60 capsule, Rfl: 1 .  metoprolol tartrate (LOPRESSOR) 25 MG tablet, TAKE (1) TABLET TWICE A DAY., Disp: 60 tablet, Rfl: 4 .  omega-3 acid ethyl esters  (LOVAZA) 1 g capsule, Take 2 capsules (2 g total) by mouth 2 (two) times daily., Disp: 360 capsule, Rfl: 5 .  ONE TOUCH ULTRA TEST test strip, CHECK BLOOD SUGAR 2 TIMES A DAY, Disp: 100 each, Rfl: prn .  triamterene-hydrochlorothiazide (DYAZIDE) 37.5-25 MG capsule, TAKE (1) CAPSULE DAILY, Disp: 30 capsule, Rfl: 4 .  valsartan (DIOVAN) 160 MG tablet, TAKE 1 TABLET DAILY, Disp: 30 tablet, Rfl: 4 .  Vitamin D, Ergocalciferol, (DRISDOL) 50000 units CAPS capsule, TAKE 1 CAPSULE ONCE A WEEK, Disp: 12 capsule, Rfl: 0 .  VOLTAREN 1 % GEL, Apply 4 g topically 4 (four) times daily as needed., Disp: 100 g, Rfl: 2  Allergies:  Allergies  Allergen Reactions  . Crestor [Rosuvastatin] Other (See Comments)    Muscle aches on 5 mg daily and 10 mg twice weekly  . Lipitor [Atorvastatin] Other (See Comments)    Muscle aches  . Livalo [Pitavastatin] Other (See Comments)       . Sulfa Antibiotics Rash  . Zetia [Ezetimibe] Other (See Comments)    Muscle aches  . Zocor [Simvastatin] Other (See Comments)    Muscle aches     Past Medical History, Surgical history, Social history, and Family History were reviewed and updated.  Review of Systems: As above  Physical Exam:  weight is 179 lb (81.2 kg). Her oral temperature is 98.2 F (36.8 C).  Her blood pressure is 109/52 (abnormal) and her pulse is 59 (abnormal). Her respiration is 18 and oxygen saturation is 95%.   Well developed and well nourished white female. Lungs are clear. Cardiac exam regular rate and rhythm with no murmurs, rubs or bruits.. Neck without adenopathy. Her thyroid is nonpalpable. Ocular exam is normal. Abdomen is soft. There is no palpable liver or spleen. She has good bowel sounds. Back exam shows no tenderness over the spine, ribs or hips.. Extremities some slight chronic nonpitting edema. She has good strength in her extremity is. She has good range of motion of her joints. Skin exam no rashes, ecchymoses or petechia. Neurological exam  nonfocal.  Lab Results  Component Value Date   WBC 10.6 (H) 06/18/2016   HGB 14.9 06/18/2016   HCT 43.5 06/18/2016   MCV 90 06/18/2016   PLT 172 06/18/2016     Chemistry      Component Value Date/Time   NA 142 01/22/2016 1152   NA 140 12/31/2015 1052   K 4.7 01/22/2016 1152   K 4.2 12/31/2015 1052   CL 101 01/22/2016 1152   CL 96 (L) 01/18/2014 1104   CO2 26 01/22/2016 1152   CO2 23 12/31/2015 1052   BUN 15 01/22/2016 1152   BUN 21.4 12/31/2015 1052   CREATININE 1.38 (H) 01/22/2016 1152   CREATININE 1.4 (H) 12/31/2015 1052      Component Value Date/Time   CALCIUM 9.8 01/22/2016 1152   CALCIUM 9.7 12/31/2015 1052   ALKPHOS 82 01/22/2016 1152   ALKPHOS 87 12/31/2015 1052   AST 25 01/22/2016 1152   AST 31 12/31/2015 1052   ALT 29 01/22/2016 1152   ALT 36 12/31/2015 1052   BILITOT 0.4 01/22/2016 1152   BILITOT 0.42 12/31/2015 1052        Impression and Plan: Carmen [name] is 76 year old white female with hemachromatosis. She is a double heterozygote.  This was a very emotional meeting for her. This is really the first time she is calm without her husband. It is just very sad that he passed away so quickly. It really has affected her. It has affected me. He was a really good man. He had a very good marriage that was full of life. They traveled.  We will see what her iron studies are. This will tell Carmen [name] what to get her back.  Typically we see her every 3 or 4 months.  I prayed with her. I talked with her for about 45 minutes regarding her husband. It was good that she had Carmen [name] to talk to to help with some of her grief.  Volanda Napoleon, MD 3/29/20182:55 PM

## 2016-06-25 ENCOUNTER — Other Ambulatory Visit: Payer: Self-pay | Admitting: Family Medicine

## 2016-07-10 ENCOUNTER — Telehealth: Payer: Self-pay | Admitting: Family Medicine

## 2016-07-13 NOTE — Telephone Encounter (Signed)
Patient was able to get Praluent 75mg  q 14 day.  Covered by mail order with [facility] and then [employer] picked up copay.

## 2016-07-27 ENCOUNTER — Other Ambulatory Visit: Payer: Self-pay | Admitting: Family Medicine

## 2016-08-24 ENCOUNTER — Other Ambulatory Visit: Payer: Self-pay | Admitting: Nurse Practitioner

## 2016-08-24 ENCOUNTER — Other Ambulatory Visit: Payer: Self-pay | Admitting: Family Medicine

## 2016-08-25 NOTE — Telephone Encounter (Signed)
Last filled 05/07/8, last seen 04/23/16. Call in. [name] pt

## 2016-08-28 NOTE — Telephone Encounter (Signed)
Authorize 30 days only. Then contact the patient letting them know that they will need an appointment before any further prescriptions can be sent in. 

## 2016-09-07 DIAGNOSIS — Z85828 Personal history of other malignant neoplasm of skin: Secondary | ICD-10-CM | POA: Diagnosis not present

## 2016-09-07 DIAGNOSIS — Z85820 Personal history of malignant melanoma of skin: Secondary | ICD-10-CM | POA: Diagnosis not present

## 2016-09-07 DIAGNOSIS — D485 Neoplasm of uncertain behavior of skin: Secondary | ICD-10-CM | POA: Diagnosis not present

## 2016-09-07 DIAGNOSIS — D2272 Melanocytic nevi of left lower limb, including hip: Secondary | ICD-10-CM | POA: Diagnosis not present

## 2016-09-07 DIAGNOSIS — D2271 Melanocytic nevi of right lower limb, including hip: Secondary | ICD-10-CM | POA: Diagnosis not present

## 2016-09-08 ENCOUNTER — Other Ambulatory Visit: Payer: Self-pay | Admitting: Family Medicine

## 2016-09-25 ENCOUNTER — Other Ambulatory Visit: Payer: Self-pay | Admitting: Nurse Practitioner

## 2016-10-19 ENCOUNTER — Encounter: Payer: Self-pay | Admitting: *Deleted

## 2016-10-20 ENCOUNTER — Ambulatory Visit (INDEPENDENT_AMBULATORY_CARE_PROVIDER_SITE_OTHER): Payer: Medicare Other | Admitting: Family Medicine

## 2016-10-20 ENCOUNTER — Encounter: Payer: Self-pay | Admitting: Family Medicine

## 2016-10-20 VITALS — BP 123/76 | HR 70 | Temp 97.7°F | Ht 63.0 in | Wt 183.0 lb

## 2016-10-20 DIAGNOSIS — I10 Essential (primary) hypertension: Secondary | ICD-10-CM

## 2016-10-20 DIAGNOSIS — E1140 Type 2 diabetes mellitus with diabetic neuropathy, unspecified: Secondary | ICD-10-CM | POA: Diagnosis not present

## 2016-10-20 DIAGNOSIS — E559 Vitamin D deficiency, unspecified: Secondary | ICD-10-CM | POA: Diagnosis not present

## 2016-10-20 DIAGNOSIS — E782 Mixed hyperlipidemia: Secondary | ICD-10-CM | POA: Diagnosis not present

## 2016-10-20 DIAGNOSIS — I25708 Atherosclerosis of coronary artery bypass graft(s), unspecified, with other forms of angina pectoris: Secondary | ICD-10-CM

## 2016-10-20 DIAGNOSIS — M1A0790 Idiopathic chronic gout, unspecified ankle and foot, without tophus (tophi): Secondary | ICD-10-CM

## 2016-10-20 LAB — URINALYSIS
BILIRUBIN UA: NEGATIVE
Glucose, UA: NEGATIVE
KETONES UA: NEGATIVE
Leukocytes, UA: NEGATIVE
NITRITE UA: NEGATIVE
PH UA: 7 (ref 5.0–7.5)
Protein, UA: NEGATIVE
RBC UA: NEGATIVE
SPEC GRAV UA: 1.015 (ref 1.005–1.030)
UUROB: 0.2 mg/dL (ref 0.2–1.0)

## 2016-10-20 LAB — BAYER DCA HB A1C WAIVED: HB A1C (BAYER DCA - WAIVED): 6.6 %

## 2016-10-20 MED ORDER — PREGABALIN 50 MG PO CAPS: ORAL_CAPSULE | ORAL | 3 refills | Status: DC

## 2016-10-20 MED ORDER — OLMESARTAN MEDOXOMIL 20 MG PO TABS: 20.0000 mg | ORAL_TABLET | Freq: Every day | ORAL | 5 refills | Status: DC

## 2016-10-20 MED ORDER — DULOXETINE HCL 30 MG PO CPEP: 30.0000 mg | ORAL_CAPSULE | Freq: Every day | ORAL | 0 refills | Status: DC

## 2016-10-20 NOTE — Progress Notes (Signed)
Subjective:  Patient ID: Carmen [name],  female    DOB: July 02, 1940  Age: 76 y.o.    CC: Hypertension (Dr Sabra Heck pt here today for routine follow up of her chronic medical conditions.)   HPI Carmen [name] presents for  follow-up of hypertension. Patient has no history of headache chest pain or shortness of breath or recent cough. Patient also denies symptoms of TIA such as numbness weakness lateralizing. Patient checks  blood pressure at home. Recent readings have been good Patient denies side effects from medication. States taking it regularly.  Patient also  in for follow-up of elevated cholesterol. Doing well without complaints on current medication. Denies side effects of statin including myalgia and arthralgia and nausea. Also in today for liver function testing. Currently no chest pain, shortness of breath or other cardiovascular related symptoms noted.  Follow-up of diabetes. Patient does check blood sugar at home. Readings run between 120 and 140Both fasting and postprandial. Patient denies symptoms such as polyuria, polydipsia, excessive hunger, nausea No significant hypoglycemic spells noted. Liver she says her feet always feel cold and numb and sometimes itch and tingle. Of note is that the patient says that she didn't get along with a previous doctor prior to Dr. Sabra Heck the cause he was always picking at her better diet. She says if she has a little extra she has a little extra. She does not want to discuss it. Medications reviewed. Pt reports taking them regularly. Pt. denies complication/adverse reaction today.  She is also followed for gout and is due for a uric acid level. She denies any frequent attacks recently. Patient is followed by hematology for hemachromatosis. Her specialist does her blood work for that and she is seeing him soon so she asked not to have a CBC drawn today as he will do that. Depression screen [facility] 2/9 10/20/2016 10/20/2016 04/23/2016 03/30/2016  01/22/2016  Decreased Interest 3 0 0 0 0  Down, Depressed, Hopeless 2 1 1 1  0  PHQ - 2 Score 5 1 1 1  0  Altered sleeping 2 - - - -  Tired, decreased energy 3 - - - -  Change in appetite 1 - - - -  Feeling bad or failure about yourself  0 - - - -  Trouble concentrating 1 - - - -  Moving slowly or fidgety/restless 0 - - - -  Suicidal thoughts 0 - - - -  PHQ-9 Score 12 - - - -  Difficult doing work/chores Somewhat difficult - - - -  Patient tearful says all she can do is try ever since her husband passed away suddenly of hemorrhagic stroke. The stroke occurred on 2022-04-15 and he passed away in intensive care 5 days later when they removed him from the ventilator. Ever since that time she's wanted to just take herself alone. Nausea control his crying. The girls at the church try to get around to do things. However she can't go bowling because of her knees but they've asked her to do other things as well. She has a neuropathy in her legs and the Lyrica usually helps but she's been out of it 2 days and is starting to have a lot of itching and burning in the legs.  History Carmen [name] has a past medical history of Anemia; Arthritis; CAD (coronary artery disease); Cancer ([place]) (11/2015); Diabetes mellitus without complication ([place]); Diverticulosis; Gout; Hemochromatosis, hereditary ([place]) (11/24/2012); Hyperlipidemia; Hypertension; and Neuromuscular disorder ([place]).   She has a past surgical  history that includes Lipoma excision; Breast surgery; Cardiac catheterization (06/03/2005); and Coronary artery bypass graft (2007).   Her family history includes Arthritis in her sister, sister, and sister; Cancer (age of onset: 68) in her sister; Diabetes in her brother, brother, brother, and father; Diverticulitis in her sister; Hemachromatosis in her brother, sister, sister, and sister; Hypertension in her father and mother; Lupus in her sister; Neuropathy in her mother; Stroke in her father and mother.She reports that  she has never smoked. She has never used smokeless tobacco. She reports that she does not drink alcohol or use drugs.  Current Outpatient Prescriptions on File Prior to Visit  Medication Sig Dispense Refill  . Alirocumab (PRALUENT) 75 MG/ML SOPN Inject 75 mg into the skin every 14 (fourteen) days.    Marland Kitchen allopurinol (ZYLOPRIM) 100 MG tablet TAKE (1) TABLET TWICE A DAY. 60 tablet 1  . amLODipine (NORVASC) 2.5 MG tablet TAKE 1 TABLET DAILY 30 tablet 1  . aspirin 81 MG tablet Take 81 mg by mouth daily.    . colchicine 0.6 MG tablet TAKE (1) TABLET DAILY AS NEEDED. 30 tablet 1  . fluticasone (FLONASE) 50 MCG/ACT nasal spray SPRAY 1 SPRAY IN EACH NOSTRIL ONCE DAILY. 16 g 4  . JANUVIA 100 MG tablet TAKE 1 TABLET DAILY 30 tablet 1  . LYRICA 50 MG capsule TAKE  (1)  CAPSULE  TWICE DAILY. 60 capsule 0  . metoprolol tartrate (LOPRESSOR) 25 MG tablet TAKE (1) TABLET TWICE A DAY. 60 tablet 1  . omega-3 acid ethyl esters (LOVAZA) 1 g capsule TAKE (2) CAPSULES TWICE DAILY. 360 capsule 0  . ONE TOUCH ULTRA TEST test strip CHECK BLOOD SUGAR 2 TIMES A DAY 100 each prn  . triamterene-hydrochlorothiazide (DYAZIDE) 37.5-25 MG capsule TAKE (1) CAPSULE DAILY 30 capsule 1  . VOLTAREN 1 % GEL Apply 4 g topically 4 (four) times daily as needed. 100 g 2   No current facility-administered medications on file prior to visit.     ROS Review of Systems  Constitutional: Negative for activity change, appetite change and fever.  HENT: Negative for congestion, rhinorrhea and sore throat.   Eyes: Negative for visual disturbance.  Respiratory: Negative for cough and shortness of breath.   Cardiovascular: Negative for chest pain and palpitations.  Gastrointestinal: Negative for abdominal pain, diarrhea and nausea.  Genitourinary: Negative for dysuria.  Musculoskeletal: Negative for arthralgias and myalgias.    Objective:  BP 123/76   Pulse 70   Temp 97.7 F (36.5 C) (Oral)   Ht 5' 3"  (1.6 m)   Wt 183 lb (83 kg)    BMI 32.42 kg/m   BP Readings from Last 3 Encounters:  10/20/16 123/76  06/18/16 (!) 109/52  04/23/16 115/68    Wt Readings from Last 3 Encounters:  10/20/16 183 lb (83 kg)  06/18/16 179 lb (81.2 kg)  04/23/16 179 lb 9.6 oz (81.5 kg)     Physical Exam  Constitutional: She is oriented to person, place, and time. She appears well-developed and well-nourished. No distress.  HENT:  Head: Normocephalic and atraumatic.  Right Ear: External ear normal.  Left Ear: External ear normal.  Nose: Nose normal.  Mouth/Throat: Oropharynx is clear and moist.  Eyes: Pupils are equal, round, and reactive to light. Conjunctivae and EOM are normal.  Neck: Normal range of motion. Neck supple. No thyromegaly present.  Cardiovascular: Normal rate, regular rhythm and normal heart sounds.   No murmur heard. Pulmonary/Chest: Effort normal and breath sounds normal. No  respiratory distress. She has no wheezes. She has no rales.  Abdominal: Soft. Bowel sounds are normal. She exhibits no distension. There is no tenderness.  Lymphadenopathy:    She has no cervical adenopathy.  Neurological: She is alert and oriented to person, place, and time. She has normal reflexes.  Skin: Skin is warm and dry.  Psychiatric: She has a normal mood and affect. Her behavior is normal. Judgment and thought content normal.    Diabetic Foot Exam - Simple   Simple Foot Form Diabetic Foot exam was performed with the following findings:  Yes 10/20/2016  3:09 PM  Visual Inspection No deformities, no ulcerations, no other skin breakdown bilaterally:  Yes Sensation Testing See comments:  Yes Pulse Check Posterior Tibialis and Dorsalis pulse intact bilaterally:  Yes Comments Diminished sensation at the bottoms of the toes are otherwise intact for monofilament       Assessment & Plan:   Anhthu was seen today for hypertension.  Diagnoses and all orders for this visit:  Essential hypertension -     CMP14+EGFR -      Urinalysis  Mixed hyperlipidemia -     CMP14+EGFR  Coronary artery disease of bypass graft of native heart with stable angina pectoris ([place])  Controlled type 2 diabetes mellitus with diabetic neuropathy, without long-term current use of insulin (HCC) -     CBC with Differential/Platelet -     CMP14+EGFR -     Bayer DCA Hb A1c Waived -     Microalbumin / creatinine urine ratio -     Urinalysis  Vitamin D deficiency -     VITAMIN D 25 Hydroxy (Vit-D Deficiency, Fractures)  Hemochromatosis, hereditary (HCC) -     CBC with Differential/Platelet  Idiopathic chronic gout of foot without tophus, unspecified laterality -     Uric acid  Other orders -     DULoxetine (CYMBALTA) 30 MG capsule; Take 1 capsule (30 mg total) by mouth daily. With a full stomach at supper. -     olmesartan (BENICAR) 20 MG tablet; Take 1 tablet (20 mg total) by mouth daily.   I have discontinued Ms. Sivertson's Vitamin D (Ergocalciferol) and valsartan. I am also having her start on DULoxetine and olmesartan. Additionally, I am having her maintain her aspirin, VOLTAREN, colchicine, ONE TOUCH ULTRA TEST, fluticasone, Alirocumab, LYRICA, allopurinol, amLODipine, JANUVIA, metoprolol tartrate, triamterene-hydrochlorothiazide, and omega-3 acid ethyl esters. Meds ordered this encounter  Medications  . DULoxetine (CYMBALTA) 30 MG capsule    Sig: Take 1 capsule (30 mg total) by mouth daily. With a full stomach at supper.    Dispense:  30 capsule    Refill:  0  . olmesartan (BENICAR) 20 MG tablet    Sig: Take 1 tablet (20 mg total) by mouth daily.    Dispense:  30 tablet    Refill:  5    Replaces valsartan. Please discontinue that drug.   55 minutes was spent with this patient more than half was in consultation regarding various topics including replacing her blood pressure medicine, problems in the past with vitamin D replacement, problems with her cholesterol medicine, however the greatest part of the discussion was  related to her depression from grieving for her husband and treatment and therapy for that.  Follow-up: Return in about 3 months (around 01/20/2017).  Claretta Fraise, M.D.

## 2016-10-20 NOTE — Addendum Note (Signed)
Addended by: Marylin Crosby on: 10/20/2016 04:36 PM   Modules accepted: Orders

## 2016-10-21 ENCOUNTER — Other Ambulatory Visit: Payer: Self-pay | Admitting: Family Medicine

## 2016-10-21 LAB — CBC WITH DIFFERENTIAL/PLATELET
BASOS ABS: 0.1 10*3/uL (ref 0.0–0.2)
Basos: 1 %
EOS (ABSOLUTE): 0.3 10*3/uL (ref 0.0–0.4)
Eos: 3 %
Hematocrit: 41.1 % (ref 34.0–46.6)
Hemoglobin: 15.1 g/dL (ref 11.1–15.9)
Immature Grans (Abs): 0.1 10*3/uL (ref 0.0–0.1)
Immature Granulocytes: 1 %
LYMPHS ABS: 3.7 10*3/uL — AB (ref 0.7–3.1)
Lymphs: 37 %
MCH: 34.6 pg — AB (ref 26.6–33.0)
MCHC: 36.7 g/dL — ABNORMAL HIGH (ref 31.5–35.7)
MCV: 94 fL (ref 79–97)
Monocytes Absolute: 0.6 10*3/uL (ref 0.1–0.9)
Monocytes: 6 %
NEUTROS ABS: 5.4 10*3/uL (ref 1.4–7.0)
Neutrophils: 52 %
PLATELETS: 184 10*3/uL (ref 150–379)
RBC: 4.37 x10E6/uL (ref 3.77–5.28)
RDW: 15.3 % (ref 12.3–15.4)
WBC: 10.1 10*3/uL (ref 3.4–10.8)

## 2016-10-21 LAB — MICROALBUMIN / CREATININE URINE RATIO
CREATININE, UR: 29.2 mg/dL
Microalb/Creat Ratio: <10.3 mg/g creat (ref 0.0–30.0)

## 2016-10-21 LAB — CMP14+EGFR
A/G RATIO: 1.6 (ref 1.2–2.2)
ALT: 19 IU/L (ref 0–32)
AST: 20 IU/L (ref 0–40)
Albumin: 4.7 g/dL (ref 3.5–4.8)
Alkaline Phosphatase: 74 IU/L (ref 39–117)
BILIRUBIN TOTAL: 0.4 mg/dL (ref 0.0–1.2)
BUN/Creatinine Ratio: 15 (ref 12–28)
BUN: 18 mg/dL (ref 8–27)
CHLORIDE: 100 mmol/L (ref 96–106)
CO2: 25 mmol/L (ref 20–29)
Calcium: 10.3 mg/dL (ref 8.7–10.3)
Creatinine, Ser: 1.22 mg/dL — ABNORMAL HIGH (ref 0.57–1.00)
GFR calc non Af Amer: 43 mL/min/{1.73_m2} — ABNORMAL LOW (ref >59)
GFR, EST AFRICAN AMERICAN: 50 mL/min/{1.73_m2} — AB (ref >59)
Globulin, Total: 2.9 g/dL (ref 1.5–4.5)
Glucose: 117 mg/dL — ABNORMAL HIGH (ref 65–99)
POTASSIUM: 4.9 mmol/L (ref 3.5–5.2)
SODIUM: 142 mmol/L (ref 134–144)
TOTAL PROTEIN: 7.6 g/dL (ref 6.0–8.5)

## 2016-10-21 LAB — URIC ACID: Uric Acid: 5.7 mg/dL (ref 2.5–7.1)

## 2016-10-21 LAB — VITAMIN D 25 HYDROXY (VIT D DEFICIENCY, FRACTURES): Vit D, 25-Hydroxy: 26.2 ng/mL — ABNORMAL LOW (ref 30.0–100.0)

## 2016-10-21 MED ORDER — VITAMIN D (ERGOCALCIFEROL) 1.25 MG (50000 UNIT) PO CAPS: 50000.0000 [IU] | ORAL_CAPSULE | ORAL | 3 refills | Status: DC

## 2016-10-22 ENCOUNTER — Ambulatory Visit (HOSPITAL_BASED_OUTPATIENT_CLINIC_OR_DEPARTMENT_OTHER): Payer: Medicare Other | Admitting: Hematology & Oncology

## 2016-10-22 ENCOUNTER — Other Ambulatory Visit (HOSPITAL_BASED_OUTPATIENT_CLINIC_OR_DEPARTMENT_OTHER): Payer: Medicare Other

## 2016-10-22 LAB — CBC WITH DIFFERENTIAL (CANCER CENTER ONLY)
BASO#: 0 10*3/uL (ref 0.0–0.2)
BASO%: 0.3 % (ref 0.0–2.0)
EOS%: 2.5 % (ref 0.0–7.0)
Eosinophils Absolute: 0.3 10*3/uL (ref 0.0–0.5)
HCT: 43.9 % (ref 34.8–46.6)
HEMOGLOBIN: 15.1 g/dL (ref 11.6–15.9)
LYMPH#: 4 10*3/uL — ABNORMAL HIGH (ref 0.9–3.3)
LYMPH%: 33.8 % (ref 14.0–48.0)
MCH: 31.2 pg (ref 26.0–34.0)
MCHC: 34.4 g/dL (ref 32.0–36.0)
MCV: 91 fL (ref 81–101)
MONO#: 0.6 10*3/uL (ref 0.1–0.9)
MONO%: 5.4 % (ref 0.0–13.0)
NEUT%: 58 % (ref 39.6–80.0)
NEUTROS ABS: 6.9 10*3/uL — AB (ref 1.5–6.5)
Platelets: 154 10*3/uL (ref 145–400)
RBC: 4.84 10*6/uL (ref 3.70–5.32)
RDW: 13.8 % (ref 11.1–15.7)
WBC: 11.9 10*3/uL — AB (ref 3.9–10.0)

## 2016-10-22 NOTE — Progress Notes (Signed)
Hematology and Oncology Follow Up Visit  Carmen [name] [id number] 1941-02-11 76 y.o. 10/22/2016   Principle Diagnosis:  Hemochromatosis (double heterozygote for C282Y and S65C mutations).  Current Therapy:    Phlebotomy to maintain ferritin less than 100     Interim History:  Ms.  Carmen [name] is back for followup. She comes in with one of her daughters. She is doing much better psychologically. She is getting through her husband's sudden death from a stroke.  In fact, she and her family will be going to [place] in September and will leave Labor Day weekend.   She feels okay. She's had no specific complaints.  We last saw her in March, her ferritin was 40 with iron saturation of only 17%.   She's had no nausea or vomiting. She's had no infections. She's had no cough. There's been no rashes. She's had no leg swelling.    Overall, her performance status is ECOG 1.  Medications:  Current Outpatient Prescriptions:  .  Alirocumab (PRALUENT) 75 MG/ML SOPN, Inject 75 mg into the skin every 14 (fourteen) days., Disp: , Rfl:  .  allopurinol (ZYLOPRIM) 100 MG tablet, TAKE (1) TABLET TWICE A DAY., Disp: 60 tablet, Rfl: 1 .  amLODipine (NORVASC) 2.5 MG tablet, TAKE 1 TABLET DAILY, Disp: 30 tablet, Rfl: 1 .  aspirin 81 MG tablet, Take 81 mg by mouth daily., Disp: , Rfl:  .  colchicine 0.6 MG tablet, TAKE (1) TABLET DAILY AS NEEDED., Disp: 30 tablet, Rfl: 1 .  DULoxetine (CYMBALTA) 30 MG capsule, Take 1 capsule (30 mg total) by mouth daily. With a full stomach at supper., Disp: 30 capsule, Rfl: 0 .  fluticasone (FLONASE) 50 MCG/ACT nasal spray, SPRAY 1 SPRAY IN EACH NOSTRIL ONCE DAILY., Disp: 16 g, Rfl: 4 .  JANUVIA 100 MG tablet, TAKE 1 TABLET DAILY, Disp: 30 tablet, Rfl: 1 .  metoprolol tartrate (LOPRESSOR) 25 MG tablet, TAKE (1) TABLET TWICE A DAY., Disp: 60 tablet, Rfl: 1 .  olmesartan (BENICAR) 20 MG tablet, Take 1 tablet (20 mg total) by mouth daily., Disp: 30 tablet, Rfl: 5 .  omega-3  acid ethyl esters (LOVAZA) 1 g capsule, TAKE (2) CAPSULES TWICE DAILY., Disp: 360 capsule, Rfl: 0 .  ONE TOUCH ULTRA TEST test strip, CHECK BLOOD SUGAR 2 TIMES A DAY, Disp: 100 each, Rfl: prn .  pregabalin (LYRICA) 50 MG capsule, TAKE  (1)  CAPSULE  TWICE DAILY., Disp: 60 capsule, Rfl: 3 .  triamterene-hydrochlorothiazide (DYAZIDE) 37.5-25 MG capsule, TAKE (1) CAPSULE DAILY, Disp: 30 capsule, Rfl: 1 .  Vitamin D, Ergocalciferol, (DRISDOL) 50000 units CAPS capsule, Take 1 capsule (50,000 Units total) by mouth every 7 (seven) days., Disp: 13 capsule, Rfl: 3 .  VOLTAREN 1 % GEL, Apply 4 g topically 4 (four) times daily as needed., Disp: 100 g, Rfl: 2  Allergies:  Allergies  Allergen Reactions  . Crestor [Rosuvastatin] Other (See Comments)    Muscle aches on 5 mg daily and 10 mg twice weekly  . Lipitor [Atorvastatin] Other (See Comments)    Muscle aches  . Livalo [Pitavastatin] Other (See Comments)       . Sulfa Antibiotics Rash  . Zetia [Ezetimibe] Other (See Comments)    Muscle aches  . Zocor [Simvastatin] Other (See Comments)    Muscle aches     Past Medical History, Surgical history, Social history, and Family History were reviewed and updated.  Review of Systems: As above  Physical Exam:  weight is 181 lb (82.1 kg).  Her oral temperature is 98.3 F (36.8 C). Her blood pressure is 145/89 (abnormal) and her pulse is 53 (abnormal). Her respiration is 16 and oxygen saturation is 93%.   Well developed and well nourished white female. Lungs are clear. Cardiac exam regular rate and rhythm with no murmurs, rubs or bruits.. Neck without adenopathy. Her thyroid is nonpalpable. Ocular exam is normal. Abdomen is soft. There is no palpable liver or spleen. She has good bowel sounds. Back exam shows no tenderness over the spine, ribs or hips.. Extremities some slight chronic nonpitting edema. She has good strength in her extremity is. She has good range of motion of her joints. Skin exam no  rashes, ecchymoses or petechia. Neurological exam nonfocal.  Lab Results  Component Value Date   WBC 11.9 (H) 10/22/2016   HGB 15.1 10/22/2016   HCT 43.9 10/22/2016   MCV 91 10/22/2016   PLT 154 10/22/2016     Chemistry      Component Value Date/Time   NA 142 10/20/2016 1551   NA 140 12/31/2015 1052   K 4.9 10/20/2016 1551   K 4.3 06/18/2016 1308   K 4.2 12/31/2015 1052   CL 100 10/20/2016 1551   CL 101 06/18/2016 1308   CL 96 (L) 01/18/2014 1104   CO2 25 10/20/2016 1551   CO2 27 06/18/2016 1308   CO2 23 12/31/2015 1052   BUN 18 10/20/2016 1551   BUN 21.4 12/31/2015 1052   CREATININE 1.22 (H) 10/20/2016 1551   CREATININE 1.33 (H) 06/18/2016 1308   CREATININE 1.4 (H) 12/31/2015 1052      Component Value Date/Time   CALCIUM 10.3 10/20/2016 1551   CALCIUM 10.3 06/18/2016 1308   CALCIUM 9.7 12/31/2015 1052   ALKPHOS 74 10/20/2016 1551   ALKPHOS 74 06/18/2016 1308   ALKPHOS 87 12/31/2015 1052   AST 20 10/20/2016 1551   AST 20 06/18/2016 1308   AST 31 12/31/2015 1052   ALT 19 10/20/2016 1551   ALT 20 06/18/2016 1308   ALT 36 12/31/2015 1052   BILITOT 0.4 10/20/2016 1551   BILITOT 0.42 12/31/2015 1052        Impression and Plan: Carmen [name] is 76 year old white female with hemachromatosis. She is a double heterozygote.  We will have to see what her iron studies show. If she needs to have a phlebotomy, she does not want this until after she and her family get back from [place]. They're going on September 2. I think it's wonderful that she is going and knowing that she will have a great time.  Depending on the iron results, this will dictate when we get her back to see [name].  Carmen Napoleon, MD 8/2/20185:46 PM

## 2016-10-23 LAB — IRON AND TIBC
%SAT: 34 % (ref 21–57)
IRON: 96 ug/dL (ref 41–142)
TIBC: 280 ug/dL (ref 236–444)
UIBC: 184 ug/dL (ref 120–384)

## 2016-10-23 LAB — FERRITIN: FERRITIN: 86 ng/mL (ref 9–269)

## 2016-10-23 NOTE — Addendum Note (Signed)
Addended by: Burney Gauze R on: 10/23/2016 04:58 PM   Modules accepted: Orders

## 2016-10-26 ENCOUNTER — Telehealth: Payer: Self-pay | Admitting: *Deleted

## 2016-10-26 NOTE — Telephone Encounter (Addendum)
Patient is aware of results  ----- Message from Volanda Napoleon, MD sent at 10/23/2016  4:56 PM EDT ----- Call - iron level is ok!!  Not too high!!  Do not need to phlebotomize you right now!!!!  [place]!!!!

## 2016-10-27 ENCOUNTER — Other Ambulatory Visit: Payer: Self-pay | Admitting: *Deleted

## 2016-10-27 ENCOUNTER — Telehealth: Payer: Self-pay | Admitting: Family Medicine

## 2016-10-27 DIAGNOSIS — E782 Mixed hyperlipidemia: Secondary | ICD-10-CM

## 2016-10-27 NOTE — Telephone Encounter (Signed)
Pt was seen on the 7/31 for problem, her routine f/u appt is 11/06/16 and she needs to be fasting for lipids. Future order for lipids placed and advised pt to come in 3-4 days prior to her appt with [facility] to have fasting blood drawn. Pt voiced understanding.

## 2016-10-28 ENCOUNTER — Other Ambulatory Visit: Payer: Self-pay | Admitting: Family Medicine

## 2016-10-29 ENCOUNTER — Ambulatory Visit (INDEPENDENT_AMBULATORY_CARE_PROVIDER_SITE_OTHER): Payer: Medicare Other | Admitting: Internal Medicine

## 2016-10-29 ENCOUNTER — Encounter: Payer: Self-pay | Admitting: Cardiovascular Disease

## 2016-10-29 ENCOUNTER — Encounter: Payer: Self-pay | Admitting: Internal Medicine

## 2016-10-29 VITALS — BP 130/62 | HR 50 | Ht 63.0 in | Wt 179.0 lb

## 2016-10-29 DIAGNOSIS — I10 Essential (primary) hypertension: Secondary | ICD-10-CM

## 2016-10-29 DIAGNOSIS — Z789 Other specified health status: Secondary | ICD-10-CM

## 2016-10-29 DIAGNOSIS — Z951 Presence of aortocoronary bypass graft: Secondary | ICD-10-CM

## 2016-10-29 DIAGNOSIS — E1140 Type 2 diabetes mellitus with diabetic neuropathy, unspecified: Secondary | ICD-10-CM | POA: Diagnosis not present

## 2016-10-29 DIAGNOSIS — E782 Mixed hyperlipidemia: Secondary | ICD-10-CM

## 2016-10-29 NOTE — Progress Notes (Signed)
OFFICE NOTE  Chief Complaint:  No complaints  Primary Care Physician: Claretta Fraise, MD  HPI:  Carmen [name] is a 76 year-old female comes in for followup of coronary disease. She had bypass surgery in 2007 and at that time had a 40% ejection fraction. She had a nuclear study in May of 2012 that was a low-risk study, but we could not calculate an ejection fraction because of ectopy. She has had no episodes of angina, no unusual shortness of breath unless she walks up a hill or a flight of steps, and then she becomes mildly breathless. She does not have PND or orthopnea. She does not have a productive cough. She has had no awareness of any arrhythmias, no dizziness, no syncope. She does have mild left ankle edema that is dependent in nature. She wears support hose infrequently for this. She has recurrent problems with gout, usually in her great toe. She has made some significant dietary changes, and the gout episodes seem to be less.  She has been diabetic for around 2 years. She has a hemoglobin A1c of 5.8, has had no significant episodes of hypoglycemia. She has noted some proximal lower extremity weakness. She gave an example working in her garden, when she tried to get up her legs were so weak that she had to get up on all fours, and one time her husband had to get her up. She has gone through physical therapy recently because of right hip bursitis, and it may very well be that she needs additional physical therapy for proximal muscle weakness and core strength building. No significant problems from the pollen. She has had some problems with constipation. Otherwise, no new complaints today.  Of note she's had a markedly abnormal lipid profile with particle numbers greater than 3000. She has been intolerant to all statins and has failed Crestor, Zetia, Zocor, Lipitor and Livalo in the past. I feel that she remains at very high risk of cardiac events and there is a great likelihood  that she may develop premature graft failure with her high cholesterol numbers. We did discuss the new PCSK9 inhibitors, for which she may be a great candidate if she is willing to give herself an injection every 2-4 weeks..  I saw Carmen [name] back in the office today. She is doing really well and denies any chest pain or worsening shortness of breath. She was referred for a trial of a study drug for PCS K9 inhibition. Unfortunately this was never arranged however subsequently to [facility] SK 9 inhibitors came out to the market. She was subsequently started on Praluent by her primary care provider and is been using that for several months. Hopefully this will get her cholesterol down to goal.  11/12/2015  Carmen [name] returns today for follow-up. Over the past year she's done fairly well. She occasionally gets some swelling in her legs. She denies any chest pain or shortness of breath. We had started her on Praluent at her last office visit due to statin intolerance. I again discussed the importance of using at least low-dose statin with the Praluent foremost benefit, however she has had a significant reduction in her cholesterol with LDL that was greater than 180 now down to 82. Although she has not had any coronary event since her bypass, it is now been 10 years since her bypass surgery. She last had a stress test in 2012 which was negative for ischemia.  10/29/2016  Carmen [name] was seen today in follow-up.  Unfortunately she lost her husband at the beginning of this year to an acute hemorrhagic stroke. This is her second husband but they've been married for 10 years and she said that he was the love of her life. Overall she is doing well from a coronary standpoint. She denies any chest pain or worsening shortness of breath. Show stress test in November of last year which was negative for ischemia. She's had marked improvement in her cholesterol on Praluent, but her triglycerides remain elevated. Most of this  is likely related to elevated blood sugars but that is also being worked on. She is currently on Januvia.  PMHx:  Past Medical History:  Diagnosis Date  . Anemia   . Arthritis   . CAD (coronary artery disease)   . Cancer ([place]) 11/2015   melanoma right upper arm  . Diabetes mellitus without complication ([place])   . Diverticulosis   . Gout   . Hemochromatosis, hereditary ([place]) 11/24/2012  . Hyperlipidemia   . Hypertension   . Neuromuscular disorder ([place])    peripheral neuropathy    Past Surgical History:  Procedure Laterality Date  . BREAST SURGERY     left breast lump--benign  . CARDIAC CATHETERIZATION  06/03/2005  . CORONARY ARTERY BYPASS GRAFT  2007   x2  . LIPOMA EXCISION     back    FAMHx:  Family History  Problem Relation Age of Onset  . Stroke Mother   . Hypertension Mother   . Neuropathy Mother   . Stroke Father   . Hypertension Father   . Diabetes Father   . Cancer Sister 27       sarcoma  . Diabetes Brother   . Arthritis Sister   . Arthritis Sister   . Lupus Sister   . Hemachromatosis Sister   . Arthritis Sister   . Hemachromatosis Sister   . Hemachromatosis Sister   . Diverticulitis Sister   . Diabetes Brother   . Diabetes Brother   . Hemachromatosis Brother     SOCHx:   reports that she has never smoked. She has never used smokeless tobacco. She reports that she does not drink alcohol or use drugs.  ALLERGIES:  Allergies  Allergen Reactions  . Crestor [Rosuvastatin] Other (See Comments)    Muscle aches on 5 mg daily and 10 mg twice weekly  . Lipitor [Atorvastatin] Other (See Comments)    Muscle aches  . Livalo [Pitavastatin] Other (See Comments)       . Sulfa Antibiotics Rash  . Zetia [Ezetimibe] Other (See Comments)    Muscle aches  . Zocor [Simvastatin] Other (See Comments)    Muscle aches     ROS: Pertinent items noted in HPI and remainder of comprehensive ROS otherwise negative.  HOME MEDS: Current Outpatient Prescriptions    Medication Sig Dispense Refill  . Alirocumab (PRALUENT) 75 MG/ML SOPN Inject 75 mg into the skin every 14 (fourteen) days.    Marland Kitchen allopurinol (ZYLOPRIM) 100 MG tablet TAKE (1) TABLET TWICE A DAY. 60 tablet 0  . amLODipine (NORVASC) 2.5 MG tablet TAKE 1 TABLET DAILY 30 tablet 0  . aspirin 81 MG tablet Take 81 mg by mouth daily.    . colchicine 0.6 MG tablet TAKE (1) TABLET DAILY AS NEEDED. 30 tablet 1  . DULoxetine (CYMBALTA) 30 MG capsule Take 1 capsule (30 mg total) by mouth daily. With a full stomach at supper. 30 capsule 0  . fluticasone (FLONASE) 50 MCG/ACT nasal spray SPRAY 1 SPRAY IN  EACH NOSTRIL ONCE DAILY. 16 g 4  . JANUVIA 100 MG tablet TAKE 1 TABLET DAILY 30 tablet 0  . metoprolol tartrate (LOPRESSOR) 25 MG tablet TAKE (1) TABLET TWICE A DAY. 60 tablet 0  . olmesartan (BENICAR) 20 MG tablet Take 1 tablet (20 mg total) by mouth daily. 30 tablet 5  . omega-3 acid ethyl esters (LOVAZA) 1 g capsule TAKE (2) CAPSULES TWICE DAILY. 360 capsule 0  . ONE TOUCH ULTRA TEST test strip CHECK BLOOD SUGAR 2 TIMES A DAY 100 each prn  . pregabalin (LYRICA) 50 MG capsule TAKE  (1)  CAPSULE  TWICE DAILY. 60 capsule 3  . triamterene-hydrochlorothiazide (DYAZIDE) 37.5-25 MG capsule TAKE (1) CAPSULE DAILY 30 capsule 0  . Vitamin D, Ergocalciferol, (DRISDOL) 50000 units CAPS capsule Take 1 capsule (50,000 Units total) by mouth every 7 (seven) days. 13 capsule 3  . VOLTAREN 1 % GEL Apply 4 g topically 4 (four) times daily as needed. 100 g 2   No current facility-administered medications for this visit.     LABS/IMAGING: No results found for this or any previous visit (from the past 48 hour(s)). No results found.  VITALS: BP 130/62   Pulse (!) 50   Ht 5\' 3"  (1.6 m)   Wt 179 lb (81.2 kg)   BMI 31.71 kg/m   EXAM: General appearance: alert, no distress and mildly obese Neck: no carotid bruit and no JVD Lungs: clear to auscultation bilaterally Heart: regular rate and rhythm, S1, S2 normal, no  murmur, click, rub or gallop Abdomen: soft, non-tender; bowel sounds normal; no masses,  no organomegaly Extremities: extremities normal, atraumatic, no cyanosis or edema and venous stasis dermatitis noted Pulses: 2+ and symmetric Skin: Skin color, texture, turgor normal. No rashes or lesions Neurologic: Grossly normal Psych: Pleasant  EKG: Sinus bradycardia 50, inferior and anterior infarct patterns, lateral T-wave changes-Personally reviewed  ASSESSMENT: 1. Coronary artery bypass grafting x2 vessels with LIMA to LAD and SVG to PDA in 2007 - negative myoview in 01/2016 2. Asymptomatic PVCs 3. Hypertension-controlled 4. Dyslipidemia - intolerant to all statins and zetia, markedly improved on Praluent 5. Diabetes 6. Gout   PLAN: 1.   Ms. [name] seems to be doing well from a coronary standpoint. Unfortunate she recently lost her husband is struggled with grieving, but is on some medication including starting on Cymbalta. Blood pressure is well controlled and cholesterol is been improved significant only on a PC SK 9 inhibitor. Primary care providers working on her diabetes. Her last stress test in November was negative for ischemia. Overall she continues to do well we'll plan to see her back annually or sooner as necessary.  Pixie Casino, MD, [facility] Attending Cardiologist [place] 10/29/2016, 1:47 PM

## 2016-10-29 NOTE — Telephone Encounter (Signed)
Opened in error

## 2016-10-29 NOTE — Patient Instructions (Signed)
Your physician wants you to follow-up in: ONE YEAR with Dr. Hilty. You will receive a reminder letter in the mail two months in advance. If you don't receive a letter, please call our office to schedule the follow-up appointment.  

## 2016-11-03 ENCOUNTER — Other Ambulatory Visit: Payer: Self-pay | Admitting: Family Medicine

## 2016-11-04 ENCOUNTER — Other Ambulatory Visit: Payer: Medicare Other

## 2016-11-04 DIAGNOSIS — E782 Mixed hyperlipidemia: Secondary | ICD-10-CM | POA: Diagnosis not present

## 2016-11-04 LAB — LIPID PANEL
CHOLESTEROL TOTAL: 164 mg/dL (ref 100–199)
Chol/HDL Ratio: 4 ratio (ref 0.0–4.4)
HDL: 41 mg/dL (ref >39)
LDL Calculated: 73 mg/dL (ref 0–99)
TRIGLYCERIDES: 252 mg/dL — AB (ref 0–149)
VLDL Cholesterol Cal: 50 mg/dL — ABNORMAL HIGH (ref 5–40)

## 2016-11-06 ENCOUNTER — Ambulatory Visit (INDEPENDENT_AMBULATORY_CARE_PROVIDER_SITE_OTHER): Payer: Medicare Other | Admitting: Family Medicine

## 2016-11-06 ENCOUNTER — Encounter: Payer: Self-pay | Admitting: Family Medicine

## 2016-11-06 VITALS — BP 132/62 | HR 58 | Temp 97.5°F | Ht 63.0 in | Wt 182.0 lb

## 2016-11-06 DIAGNOSIS — I10 Essential (primary) hypertension: Secondary | ICD-10-CM

## 2016-11-06 DIAGNOSIS — I25708 Atherosclerosis of coronary artery bypass graft(s), unspecified, with other forms of angina pectoris: Secondary | ICD-10-CM

## 2016-11-06 DIAGNOSIS — F4321 Adjustment disorder with depressed mood: Secondary | ICD-10-CM

## 2016-11-06 DIAGNOSIS — E1140 Type 2 diabetes mellitus with diabetic neuropathy, unspecified: Secondary | ICD-10-CM | POA: Diagnosis not present

## 2016-11-06 DIAGNOSIS — F4320 Adjustment disorder, unspecified: Secondary | ICD-10-CM

## 2016-11-06 DIAGNOSIS — F321 Major depressive disorder, single episode, moderate: Secondary | ICD-10-CM | POA: Diagnosis not present

## 2016-11-06 NOTE — Progress Notes (Signed)
Subjective:  Patient ID: Carmen [name], female    DOB: Dec 31, 1940  Age: 76 y.o. MRN: [id number]  CC: Follow-up (pt here today following up after starting Cymbalta for legs aching and taking Olmesartan instead of Valsartan. )   HPI Carmen [name] presents for Recheck of the Cymbalta for depression and for the leg aches. Leg aches are better but the true improvement has been with her depression. She been sitting around crying all the time and unable to reconcile herself to the loss of her husband. Since starting the Cymbalta she is able to cope and move forward. She is now looking forward to her trip to [place] with her [place]. She is thinking clearly about traveling in the future with other friends etc. She does complain of a dry mouth but that is mild and she admits she needs to drink more water. With regard to the [facility] she says that she felt tired for the first few days of using it. That has worn off.She states that her blood sugar has been stable and in fact could recently. No worsening with change of medicine. The dry mouth has not been accompanied by polyuria.  Depression screen [facility] 2/9 11/06/2016 10/20/2016 10/20/2016  Decreased Interest 2 3 0  Down, Depressed, Hopeless 2 2 1   PHQ - 2 Score 4 5 1   Altered sleeping 2 2 -  Tired, decreased energy 2 3 -  Change in appetite 1 1 -  Feeling bad or failure about yourself  0 0 -  Trouble concentrating 0 1 -  Moving slowly or fidgety/restless 0 0 -  Suicidal thoughts 0 0 -  PHQ-9 Score 9 12 -  Difficult doing work/chores Somewhat difficult Somewhat difficult -    History Carmen [name] has a past medical history of Anemia; Arthritis; CAD (coronary artery disease); Cancer ([place]) (11/2015); Diabetes mellitus without complication ([place]); Diverticulosis; Gout; Hemochromatosis, hereditary ([place]) (11/24/2012); Hyperlipidemia; Hypertension; and Neuromuscular disorder ([place]).   She has a past surgical history that includes Lipoma excision; Breast  surgery; Cardiac catheterization (06/03/2005); and Coronary artery bypass graft (2007).   Her family history includes Arthritis in her sister, sister, and sister; Cancer (age of onset: 73) in her sister; Diabetes in her brother, brother, brother, and father; Diverticulitis in her sister; Hemachromatosis in her brother, sister, sister, and sister; Hypertension in her father and mother; Lupus in her sister; Neuropathy in her mother; Stroke in her father and mother.She reports that she has never smoked. She has never used smokeless tobacco. She reports that she does not drink alcohol or use drugs.    ROS Review of Systems  Constitutional: Negative for activity change, appetite change and fever.  HENT: Negative for congestion, rhinorrhea and sore throat.   Eyes: Negative for visual disturbance.  Respiratory: Negative for cough and shortness of breath.   Cardiovascular: Negative for chest pain and palpitations.  Gastrointestinal: Negative for abdominal pain, diarrhea and nausea.  Genitourinary: Negative for dysuria.  Musculoskeletal: Negative for arthralgias and myalgias.    Objective:  BP 132/62   Pulse (!) 58   Temp (!) 97.5 F (36.4 C) (Oral)   Ht 5\' 3"  (1.6 m)   Wt 182 lb (82.6 kg)   BMI 32.24 kg/m   BP Readings from Last 3 Encounters:  11/06/16 132/62  10/29/16 130/62  10/22/16 (!) 145/89    Wt Readings from Last 3 Encounters:  11/06/16 182 lb (82.6 kg)  10/29/16 179 lb (81.2 kg)  10/22/16 181 lb (82.1 kg)  Physical Exam  Constitutional: She is oriented to person, place, and time. She appears well-developed and well-nourished. No distress.  HENT:  Head: Normocephalic and atraumatic.  Eyes: Pupils are equal, round, and reactive to light. Conjunctivae are normal.  Neck: Normal range of motion. Neck supple. No thyromegaly present.  Cardiovascular: Normal rate, regular rhythm and normal heart sounds.   No murmur heard. Pulmonary/Chest: Effort normal and breath sounds  normal. No respiratory distress. She has no wheezes. She has no rales.  Musculoskeletal: Normal range of motion.  Lymphadenopathy:    She has no cervical adenopathy.  Neurological: She is alert and oriented to person, place, and time.  Skin: Skin is warm and dry.  Psychiatric: She has a normal mood and affect. Her behavior is normal. Judgment and thought content normal.      Assessment & Plan:   There are no diagnoses linked to this encounter.     I have discontinued Ms. Lavalley's amLODipine. I am also having her maintain her aspirin, VOLTAREN, colchicine, ONE TOUCH ULTRA TEST, fluticasone, Alirocumab, omega-3 acid ethyl esters, olmesartan, pregabalin, Vitamin D (Ergocalciferol), triamterene-hydrochlorothiazide, metoprolol tartrate, JANUVIA, allopurinol, and DULoxetine.  Allergies as of 11/06/2016      Reactions   Crestor [rosuvastatin] Other (See Comments)   Muscle aches on 5 mg daily and 10 mg twice weekly   Lipitor [atorvastatin] Other (See Comments)   Muscle aches   Livalo [pitavastatin] Other (See Comments)      Sulfa Antibiotics Rash   Zetia [ezetimibe] Other (See Comments)   Muscle aches   Zocor [simvastatin] Other (See Comments)   Muscle aches       Medication List       Accurate as of 11/06/16  2:29 PM. Always use your most recent med list.          allopurinol 100 MG tablet Commonly known as:  ZYLOPRIM TAKE (1) TABLET TWICE A DAY.   aspirin 81 MG tablet Take 81 mg by mouth daily.   colchicine 0.6 MG tablet TAKE (1) TABLET DAILY AS NEEDED.   DULoxetine 30 MG capsule Commonly known as:  CYMBALTA TAKE 1 CAPSULE ONCE DAILY WITH LARGEST MEAL   fluticasone 50 MCG/ACT nasal spray Commonly known as:  FLONASE SPRAY 1 SPRAY IN EACH NOSTRIL ONCE DAILY.   JANUVIA 100 MG tablet Generic drug:  sitaGLIPtin TAKE 1 TABLET DAILY   metoprolol tartrate 25 MG tablet Commonly known as:  LOPRESSOR TAKE (1) TABLET TWICE A DAY.   olmesartan 20 MG tablet Commonly  known as:  BENICAR Take 1 tablet (20 mg total) by mouth daily.   omega-3 acid ethyl esters 1 g capsule Commonly known as:  LOVAZA TAKE (2) CAPSULES TWICE DAILY.   ONE TOUCH ULTRA TEST test strip Generic drug:  glucose blood CHECK BLOOD SUGAR 2 TIMES A DAY   PRALUENT 75 MG/ML Sopn Generic drug:  Alirocumab Inject 75 mg into the skin every 14 (fourteen) days.   pregabalin 50 MG capsule Commonly known as:  LYRICA TAKE  (1)  CAPSULE  TWICE DAILY.   triamterene-hydrochlorothiazide 37.5-25 MG capsule Commonly known as:  DYAZIDE TAKE (1) CAPSULE DAILY   Vitamin D (Ergocalciferol) 50000 units Caps capsule Commonly known as:  DRISDOL Take 1 capsule (50,000 Units total) by mouth every 7 (seven) days.   VOLTAREN 1 % Gel Generic drug:  diclofenac sodium Apply 4 g topically 4 (four) times daily as needed.      Continue meds as is. Patient going to [place] with her 2  grown daughters next month.  Follow-up: Return in about 3 months (around 02/06/2017).  Claretta Fraise, M.D.

## 2016-12-05 ENCOUNTER — Other Ambulatory Visit: Payer: Self-pay | Admitting: Family Medicine

## 2017-01-11 DIAGNOSIS — L821 Other seborrheic keratosis: Secondary | ICD-10-CM | POA: Diagnosis not present

## 2017-01-11 DIAGNOSIS — Z85820 Personal history of malignant melanoma of skin: Secondary | ICD-10-CM | POA: Diagnosis not present

## 2017-01-11 DIAGNOSIS — Z85828 Personal history of other malignant neoplasm of skin: Secondary | ICD-10-CM | POA: Diagnosis not present

## 2017-01-11 DIAGNOSIS — D0359 Melanoma in situ of other part of trunk: Secondary | ICD-10-CM | POA: Diagnosis not present

## 2017-01-11 DIAGNOSIS — C4371 Malignant melanoma of right lower limb, including hip: Secondary | ICD-10-CM | POA: Diagnosis not present

## 2017-01-15 ENCOUNTER — Other Ambulatory Visit: Payer: Self-pay | Admitting: Family Medicine

## 2017-01-18 ENCOUNTER — Encounter: Payer: Self-pay | Admitting: Family Medicine

## 2017-01-18 ENCOUNTER — Other Ambulatory Visit: Payer: Self-pay | Admitting: Family Medicine

## 2017-01-18 ENCOUNTER — Ambulatory Visit (INDEPENDENT_AMBULATORY_CARE_PROVIDER_SITE_OTHER): Payer: Medicare Other | Admitting: Family Medicine

## 2017-01-18 VITALS — BP 134/69 | HR 57 | Temp 98.2°F | Ht 63.0 in | Wt 184.0 lb

## 2017-01-18 DIAGNOSIS — I10 Essential (primary) hypertension: Secondary | ICD-10-CM

## 2017-01-18 DIAGNOSIS — F4321 Adjustment disorder with depressed mood: Secondary | ICD-10-CM

## 2017-01-18 DIAGNOSIS — M1990 Unspecified osteoarthritis, unspecified site: Secondary | ICD-10-CM | POA: Diagnosis not present

## 2017-01-18 MED ORDER — OLMESARTAN MEDOXOMIL 20 MG PO TABS: 20.0000 mg | ORAL_TABLET | Freq: Every day | ORAL | 1 refills | Status: DC

## 2017-01-18 MED ORDER — COLCHICINE 0.6 MG PO TABS: ORAL_TABLET | ORAL | 1 refills | Status: DC

## 2017-01-18 MED ORDER — ALLOPURINOL 100 MG PO TABS: ORAL_TABLET | ORAL | 1 refills | Status: DC

## 2017-01-18 MED ORDER — PREGABALIN 50 MG PO CAPS: ORAL_CAPSULE | ORAL | 3 refills | Status: DC

## 2017-01-18 MED ORDER — GLUCOSE BLOOD VI STRP: ORAL_STRIP | 99 refills | Status: DC

## 2017-01-18 MED ORDER — TRIAMTERENE-HCTZ 37.5-25 MG PO CAPS: ORAL_CAPSULE | ORAL | 1 refills | Status: DC

## 2017-01-18 MED ORDER — OMEGA-3-ACID ETHYL ESTERS 1 G PO CAPS: ORAL_CAPSULE | ORAL | 1 refills | Status: DC

## 2017-01-18 MED ORDER — METOPROLOL TARTRATE 25 MG PO TABS: ORAL_TABLET | ORAL | 1 refills | Status: DC

## 2017-01-18 MED ORDER — DICLOFENAC SODIUM 75 MG PO TBEC: 75.0000 mg | DELAYED_RELEASE_TABLET | Freq: Two times a day (BID) | ORAL | 1 refills | Status: DC

## 2017-01-18 NOTE — Progress Notes (Signed)
Subjective:  Patient ID: Carmen [name], female    DOB: 11/14/1940  Age: 76 y.o. MRN: [id number]  CC: Arm Pain (pt here today c/o right arm pain that she believes is from carrying heavy grocery bags and her heavy pocketbook)   HPI Carmen [name] presents for Doing a lot of lifting with her arms carrying grocery bags and a heavy pocketbook. Both lobes hurting primarily the right. She had a wide excision of a melanoma at the right upper arm last year it goes across the lower aspect of the biceps near the elbow where the scar is. She says they went all the way into the muscle.  Patient had a lesion removed from the right shoulder blades last week and another from the right leg. Unfortunately she heard back today from her dermatologist stating that organ have to go back because the margins were not clear. Both have involvement of melanoma.  Patient tells me today that her depression has resolved completely. She recently went [place] a trip to [place] family. During that trip she had a dream of spending a day with her husband that made a dramatic impact on her and she's been fine ever since. About that time she also developed profuse with the use of the Cymbalta and had to discontinue that. She assures me that she tapered herself off of it.  Depression screen [facility] 2/9 11/06/2016 10/20/2016 10/20/2016  Decreased Interest 2 3 0  Down, Depressed, Hopeless 2 2 1   PHQ - 2 Score 4 5 1   Altered sleeping 2 2 -  Tired, decreased energy 2 3 -  Change in appetite 1 1 -  Feeling bad or failure about yourself  0 0 -  Trouble concentrating 0 1 -  Moving slowly or fidgety/restless 0 0 -  Suicidal thoughts 0 0 -  PHQ-9 Score 9 12 -  Difficult doing work/chores Somewhat difficult Somewhat difficult -    History Carmen [name] has a past medical history of Anemia; Arthritis; CAD (coronary artery disease); Cancer ([place]) (11/2015); Diabetes mellitus without complication ([place]); Diverticulosis; Gout;  Hemochromatosis, hereditary ([place]) (11/24/2012); Hyperlipidemia; Hypertension; and Neuromuscular disorder ([place]).   She has a past surgical history that includes Lipoma excision; Breast surgery; Cardiac catheterization (06/03/2005); and Coronary artery bypass graft (2007).   Her family history includes Arthritis in her sister, sister, and sister; Cancer (age of onset: 64) in her sister; Diabetes in her brother, brother, brother, and father; Diverticulitis in her sister; Hemachromatosis in her brother, sister, sister, and sister; Hypertension in her father and mother; Lupus in her sister; Neuropathy in her mother; Stroke in her father and mother.She reports that she has never smoked. She has never used smokeless tobacco. She reports that she does not drink alcohol or use drugs.    ROS Review of Systems  Constitutional: Negative for activity change, appetite change and fever.  HENT: Negative for congestion, rhinorrhea and sore throat.   Eyes: Negative for visual disturbance.  Respiratory: Negative for cough and shortness of breath.   Cardiovascular: Negative for chest pain and palpitations.  Gastrointestinal: Negative for abdominal pain, diarrhea and nausea.  Genitourinary: Negative for dysuria.  Musculoskeletal: Positive for arthralgias and myalgias.    Objective:  BP 134/69   Pulse (!) 57   Temp 98.2 F (36.8 C) (Oral)   Ht 5\' 3"  (1.6 m)   Wt 184 lb (83.5 kg)   BMI 32.59 kg/m   BP Readings from Last 3 Encounters:  01/18/17 134/69  11/06/16 132/62  10/29/16 130/62    Wt Readings from Last 3 Encounters:  01/18/17 184 lb (83.5 kg)  11/06/16 182 lb (82.6 kg)  10/29/16 179 lb (81.2 kg)     Physical Exam  Constitutional: She is oriented to person, place, and time. She appears well-developed and well-nourished. No distress.  Cardiovascular: Normal rate and regular rhythm.   Pulmonary/Chest: Breath sounds normal.  Musculoskeletal: Normal range of motion. She exhibits tenderness (at  the deltoid region of the right shoulder). She exhibits no edema or deformity.  Neurological: She is alert and oriented to person, place, and time.  Skin: Skin is warm and dry.  Psychiatric: She has a normal mood and affect.      Assessment & Plan:   Carmen [name] was seen today for arm pain.  Diagnoses and all orders for this visit:  Essential hypertension  Arthritis  Grief reaction  Other orders -     diclofenac (VOLTAREN) 75 MG EC tablet; Take 1 tablet (75 mg total) by mouth 2 (two) times daily. -     allopurinol (ZYLOPRIM) 100 MG tablet; TAKE (1) TABLET TWICE A DAY. -     colchicine 0.6 MG tablet; TAKE (1) TABLET DAILY AS NEEDED. -     metoprolol tartrate (LOPRESSOR) 25 MG tablet; TAKE (1) TABLET TWICE A DAY. -     olmesartan (BENICAR) 20 MG tablet; Take 1 tablet (20 mg total) by mouth daily. -     omega-3 acid ethyl esters (LOVAZA) 1 g capsule; TAKE (2) CAPSULES TWICE DAILY. -     glucose blood (ONE TOUCH ULTRA TEST) test strip; CHECK BLOOD SUGAR 2 TIMES A DAY -     pregabalin (LYRICA) 50 MG capsule; TAKE  (1)  CAPSULE  TWICE DAILY. -     triamterene-hydrochlorothiazide (DYAZIDE) 37.5-25 MG capsule; TAKE (1) CAPSULE DAILY       I have discontinued Carmen [name]'s DULoxetine. I have also changed her ONE TOUCH ULTRA TEST to glucose blood. Additionally, I am having her start on diclofenac. Lastly, I am having her maintain her aspirin, VOLTAREN, fluticasone, Alirocumab, Vitamin D (Ergocalciferol), JANUVIA, allopurinol, colchicine, metoprolol tartrate, olmesartan, omega-3 acid ethyl esters, pregabalin, and triamterene-hydrochlorothiazide.  Allergies as of 01/18/2017      Reactions   Cymbalta [duloxetine Hcl] Other (See Comments)   Profuse sweating   Crestor [rosuvastatin] Other (See Comments)   Muscle aches on 5 mg daily and 10 mg twice weekly   Lipitor [atorvastatin] Other (See Comments)   Muscle aches   Livalo [pitavastatin] Other (See Comments)      Sulfa Antibiotics Rash     Zetia [ezetimibe] Other (See Comments)   Muscle aches   Zocor [simvastatin] Other (See Comments)   Muscle aches       Medication List       Accurate as of 01/18/17  4:52 PM. Always use your most recent med list.          allopurinol 100 MG tablet Commonly known as:  ZYLOPRIM TAKE (1) TABLET TWICE A DAY.   aspirin 81 MG tablet Take 81 mg by mouth daily.   colchicine 0.6 MG tablet TAKE (1) TABLET DAILY AS NEEDED.   diclofenac 75 MG EC tablet Commonly known as:  VOLTAREN Take 1 tablet (75 mg total) by mouth 2 (two) times daily.   fluticasone 50 MCG/ACT nasal spray Commonly known as:  FLONASE SPRAY 1 SPRAY IN EACH NOSTRIL ONCE DAILY.   glucose blood test strip Commonly known as:  ONE TOUCH  ULTRA TEST CHECK BLOOD SUGAR 2 TIMES A DAY   JANUVIA 100 MG tablet Generic drug:  sitaGLIPtin TAKE 1 TABLET DAILY   metoprolol tartrate 25 MG tablet Commonly known as:  LOPRESSOR TAKE (1) TABLET TWICE A DAY.   olmesartan 20 MG tablet Commonly known as:  BENICAR Take 1 tablet (20 mg total) by mouth daily.   omega-3 acid ethyl esters 1 g capsule Commonly known as:  LOVAZA TAKE (2) CAPSULES TWICE DAILY.   PRALUENT 75 MG/ML Sopn Generic drug:  Alirocumab Inject 75 mg into the skin every 14 (fourteen) days.   pregabalin 50 MG capsule Commonly known as:  LYRICA TAKE  (1)  CAPSULE  TWICE DAILY.   triamterene-hydrochlorothiazide 37.5-25 MG capsule Commonly known as:  DYAZIDE TAKE (1) CAPSULE DAILY   Vitamin D (Ergocalciferol) 50000 units Caps capsule Commonly known as:  DRISDOL Take 1 capsule (50,000 Units total) by mouth every 7 (seven) days.   VOLTAREN 1 % Gel Generic drug:  diclofenac sodium Apply 4 g topically 4 (four) times daily as needed.        Follow-up: Return in about 6 months (around 07/19/2017).  Claretta Fraise, M.D.

## 2017-01-19 ENCOUNTER — Telehealth: Payer: Self-pay

## 2017-01-19 ENCOUNTER — Other Ambulatory Visit: Payer: Self-pay | Admitting: *Deleted

## 2017-01-19 NOTE — Telephone Encounter (Signed)
I am getting a prior auth for Diclofenac Sodium and I do not see on her med list  Do you want me to do prior auth  If so I need if this is tablets or gel and directions for use  Thanks

## 2017-01-19 NOTE — Telephone Encounter (Signed)
Diclofenac is on her med list as diclofenac(voltaren)75 mg EC tablet. That is what needs prior auth. Thanks, WS

## 2017-01-22 ENCOUNTER — Ambulatory Visit (HOSPITAL_BASED_OUTPATIENT_CLINIC_OR_DEPARTMENT_OTHER): Payer: Medicare Other | Admitting: Hematology & Oncology

## 2017-01-22 ENCOUNTER — Other Ambulatory Visit (HOSPITAL_BASED_OUTPATIENT_CLINIC_OR_DEPARTMENT_OTHER): Payer: Medicare Other

## 2017-01-22 LAB — CMP (CANCER CENTER ONLY)
ALBUMIN: 3.8 g/dL (ref 3.3–5.5)
ALK PHOS: 65 U/L (ref 26–84)
ALT: 25 U/L (ref 10–47)
AST: 29 U/L (ref 11–38)
BILIRUBIN TOTAL: 0.5 mg/dL (ref 0.20–1.60)
BUN, Bld: 23 mg/dL — ABNORMAL HIGH (ref 7–22)
CALCIUM: 10.3 mg/dL (ref 8.0–10.3)
CO2: 29 mEq/L (ref 18–33)
Chloride: 106 mEq/L (ref 98–108)
Creat: 1.6 mg/dl — ABNORMAL HIGH (ref 0.6–1.2)
Glucose, Bld: 123 mg/dL — ABNORMAL HIGH (ref 73–118)
POTASSIUM: 4.9 meq/L — AB (ref 3.3–4.7)
Sodium: 147 mEq/L — ABNORMAL HIGH (ref 128–145)
Total Protein: 7.5 g/dL (ref 6.4–8.1)

## 2017-01-22 LAB — CBC WITH DIFFERENTIAL (CANCER CENTER ONLY)
BASO#: 0 10*3/uL (ref 0.0–0.2)
BASO%: 0.3 % (ref 0.0–2.0)
EOS%: 3.4 % (ref 0.0–7.0)
Eosinophils Absolute: 0.4 10*3/uL (ref 0.0–0.5)
HEMATOCRIT: 42.4 % (ref 34.8–46.6)
HEMOGLOBIN: 14.5 g/dL (ref 11.6–15.9)
LYMPH#: 4.2 10*3/uL — AB (ref 0.9–3.3)
LYMPH%: 37.8 % (ref 14.0–48.0)
MCH: 31.5 pg (ref 26.0–34.0)
MCHC: 34.2 g/dL (ref 32.0–36.0)
MCV: 92 fL (ref 81–101)
MONO#: 0.7 10*3/uL (ref 0.1–0.9)
MONO%: 6.1 % (ref 0.0–13.0)
NEUT%: 52.4 % (ref 39.6–80.0)
NEUTROS ABS: 5.8 10*3/uL (ref 1.5–6.5)
Platelets: 159 10*3/uL (ref 145–400)
RBC: 4.6 10*6/uL (ref 3.70–5.32)
RDW: 13.5 % (ref 11.1–15.7)
WBC: 11 10*3/uL — ABNORMAL HIGH (ref 3.9–10.0)

## 2017-01-22 NOTE — Progress Notes (Signed)
Hematology and Oncology Follow Up Visit  Carmen [name] [id number] Jan 18, 1941 76 y.o. 01/22/2017   Principle Diagnosis:  Hemochromatosis (double heterozygote for C282Y and S65C mutations).  Current Therapy:    Phlebotomy to maintain ferritin less than 100     Interim History:  [name] is back for followup.  She is doing well.  She and her daughters went to [place].  She went on her anniversary.  Her husband passed away suddenly earlier this year from a massive stroke.  She really enjoyed it.  She had an incredible dream when she was there that her husband and 1 of her sisters who had passed on and came to be with her and they went on a trip around [place].  I know this is still incredibly difficult for her.  She also is having surgery for melanomas.  She has 2 more melanomas.  One on the back of her right lower leg and one on the right upper back.  We last saw her back in August, her ferritin was 86 with an iron saturation of 34%.  She has had no cough or shortness of breath.  She has had no fever.  She has had no nausea or vomiting.   Overall, her performance status is ECOG 1.  Medications:  Current Outpatient Prescriptions:  .  Alirocumab (PRALUENT) 75 MG/ML SOPN, Inject 75 mg into the skin every 14 (fourteen) days., Disp: , Rfl:  .  allopurinol (ZYLOPRIM) 100 MG tablet, TAKE (1) TABLET TWICE A DAY., Disp: 180 tablet, Rfl: 1 .  aspirin 81 MG tablet, Take 81 mg by mouth daily., Disp: , Rfl:  .  colchicine 0.6 MG tablet, TAKE (1) TABLET DAILY AS NEEDED., Disp: 30 tablet, Rfl: 1 .  diclofenac (VOLTAREN) 75 MG EC tablet, Take 1 tablet (75 mg total) by mouth 2 (two) times daily., Disp: 60 tablet, Rfl: 1 .  fluticasone (FLONASE) 50 MCG/ACT nasal spray, SPRAY 1 SPRAY IN EACH NOSTRIL ONCE DAILY., Disp: 16 g, Rfl: 4 .  glucose blood (ONE TOUCH ULTRA TEST) test strip, CHECK BLOOD SUGAR 2 TIMES A DAY, Disp: 100 each, Rfl: prn .  JANUVIA 100 MG tablet, TAKE 1 TABLET DAILY, Disp: 90  tablet, Rfl: 0 .  metoprolol tartrate (LOPRESSOR) 25 MG tablet, TAKE (1) TABLET TWICE A DAY., Disp: 180 tablet, Rfl: 1 .  olmesartan (BENICAR) 20 MG tablet, Take 1 tablet (20 mg total) by mouth daily., Disp: 90 tablet, Rfl: 1 .  omega-3 acid ethyl esters (LOVAZA) 1 g capsule, TAKE (2) CAPSULES TWICE DAILY., Disp: 360 capsule, Rfl: 1 .  pregabalin (LYRICA) 50 MG capsule, TAKE  (1)  CAPSULE  TWICE DAILY., Disp: 60 capsule, Rfl: 3 .  triamterene-hydrochlorothiazide (DYAZIDE) 37.5-25 MG capsule, TAKE (1) CAPSULE DAILY, Disp: 90 capsule, Rfl: 1 .  Vitamin D, Ergocalciferol, (DRISDOL) 50000 units CAPS capsule, Take 1 capsule (50,000 Units total) by mouth every 7 (seven) days., Disp: 13 capsule, Rfl: 3 .  VOLTAREN 1 % GEL, APPLY 4 GRAMS TO AFFECTED AREA(S) 4 TIMES A DAY AS NEEDED, Disp: 100 g, Rfl: 2  Allergies:  Allergies  Allergen Reactions  . Cymbalta [Duloxetine Hcl] Other (See Comments)    Profuse sweating  . Crestor [Rosuvastatin] Other (See Comments)    Muscle aches on 5 mg daily and 10 mg twice weekly  . Lipitor [Atorvastatin] Other (See Comments)    Muscle aches  . Livalo [Pitavastatin] Other (See Comments)       . Sulfa Antibiotics Rash  .  Zetia [Ezetimibe] Other (See Comments)    Muscle aches  . Zocor [Simvastatin] Other (See Comments)    Muscle aches     Past Medical History, Surgical history, Social history, and Family History were reviewed and updated.  Review of Systems: As stated in the interim history  Physical Exam:  weight is 182 lb (82.6 kg). Her oral temperature is 97.4 F (36.3 C) (abnormal). Her blood pressure is 141/62 (abnormal) and her pulse is 79. Her respiration is 18 and oxygen saturation is 96%.   Mildly obese white female.  Head and neck exam shows no ocular or oral lesions.  There are no palpable cervical or supraclavicular lymph nodes.  Lungs are clear bilaterally.  Cardiac exam regular rate and rhythm with no murmurs, rubs or bruits.  Abdomen is soft.   She has good bowel sounds.  There is no fluid wave.  There is no palpable liver or spleen tip.  Extremities shows no clubbing, cyanosis or edema.  Back exam shows no tenderness over the spine, ribs or hips.  Skin exam does show the excised melanoma in the right upper back and at the back of the right lower leg.  Lab Results  Component Value Date   WBC 11.0 (H) 01/22/2017   HGB 14.5 01/22/2017   HCT 42.4 01/22/2017   MCV 92 01/22/2017   PLT 159 01/22/2017     Chemistry      Component Value Date/Time   NA 147 (H) 01/22/2017 1451   NA 140 12/31/2015 1052   K 4.9 (H) 01/22/2017 1451   K 4.2 12/31/2015 1052   CL 106 01/22/2017 1451   CO2 29 01/22/2017 1451   CO2 23 12/31/2015 1052   BUN 23 (H) 01/22/2017 1451   BUN 21.4 12/31/2015 1052   CREATININE 1.6 (H) 01/22/2017 1451   CREATININE 1.4 (H) 12/31/2015 1052      Component Value Date/Time   CALCIUM 10.3 01/22/2017 1451   CALCIUM 9.7 12/31/2015 1052   ALKPHOS 65 01/22/2017 1451   ALKPHOS 87 12/31/2015 1052   AST 29 01/22/2017 1451   AST 31 12/31/2015 1052   ALT 25 01/22/2017 1451   ALT 36 12/31/2015 1052   BILITOT 0.50 01/22/2017 1451   BILITOT 0.42 12/31/2015 1052        Impression and Plan: Carmen [name] is 76 year old white female with hemachromatosis. She is a double heterozygote.  We will see what her iron studies show.  I do not see any problems with her having the melanoma surgeries.  I would be surprised if she would have anything that needed to be treated in the adjuvant setting.  I would like to see her back in another 3 months.  We will try to get her through the holidays and most of the winter..  . Volanda Napoleon, MD 11/2/20183:45 PM

## 2017-01-25 DIAGNOSIS — D039 Melanoma in situ, unspecified: Secondary | ICD-10-CM | POA: Diagnosis not present

## 2017-01-25 DIAGNOSIS — C4371 Malignant melanoma of right lower limb, including hip: Secondary | ICD-10-CM | POA: Diagnosis not present

## 2017-01-25 LAB — FERRITIN: FERRITIN: 104 ng/mL (ref 9–269)

## 2017-01-25 LAB — IRON AND TIBC
%SAT: 29 % (ref 21–57)
IRON: 79 ug/dL (ref 41–142)
TIBC: 273 ug/dL (ref 236–444)
UIBC: 194 ug/dL (ref 120–384)

## 2017-01-27 ENCOUNTER — Telehealth: Payer: Self-pay | Admitting: *Deleted

## 2017-01-27 NOTE — Telephone Encounter (Addendum)
Patient is aware of results. Patient states she is too busy to have a phlebotomy right now. She has multiple surgeries and procedures scheduled and she states a phlebotomy is too much. She wants to wait until her next appointment in February.   Dr Marin Olp is aware of patient's decision  ----- Message from Volanda Napoleon, MD sent at 01/25/2017  1:22 PM EST ----- Call - iron is too high!!  Need 1 phlebot6omy!!  Please set up!!  Carmen [name]

## 2017-02-08 DIAGNOSIS — D0359 Melanoma in situ of other part of trunk: Secondary | ICD-10-CM | POA: Diagnosis not present

## 2017-02-08 DIAGNOSIS — L988 Other specified disorders of the skin and subcutaneous tissue: Secondary | ICD-10-CM | POA: Diagnosis not present

## 2017-03-06 ENCOUNTER — Ambulatory Visit: Payer: Medicare Other

## 2017-03-08 ENCOUNTER — Encounter: Payer: Self-pay | Admitting: Family Medicine

## 2017-03-08 ENCOUNTER — Ambulatory Visit (INDEPENDENT_AMBULATORY_CARE_PROVIDER_SITE_OTHER): Payer: Medicare Other | Admitting: Family Medicine

## 2017-03-08 VITALS — BP 130/66 | HR 76 | Temp 97.1°F | Ht 63.0 in | Wt 184.0 lb

## 2017-03-08 DIAGNOSIS — M25511 Pain in right shoulder: Secondary | ICD-10-CM

## 2017-03-08 MED ORDER — PREDNISONE 10 MG (48) PO TBPK: ORAL_TABLET | ORAL | 0 refills | Status: DC

## 2017-03-08 NOTE — Progress Notes (Signed)
Chief Complaint  Patient presents with  . Arm Pain    pt here today c/o right arm pain which is so severe she can't even move her arm and is sometimes in so much pain she cries.    HPI  Patient presents today for insidious onset for about 1-2 weeks of right upper arm and shoulder pain that is 8-9/10 in severity.  It is a dull ache.  It is so severe she cannot move the arm without crying out in pain.  There is no known injury.  There is no previous injury.  There is no numbness or tingling.  She can move the fingers and hand.  The pain is limited to the shoulder area  PMH: Smoking status noted ROS: Per HPI  Objective: BP 130/66   Pulse 76   Temp (!) 97.1 F (36.2 C) (Oral)   Ht 5\' 3"  (1.6 m)   Wt 184 lb (83.5 kg)   BMI 32.59 kg/m  Gen: NAD, alert, cooperative with exam HEENT: NCAT, EOMI, PERRL CV: RRR, good S1/S2, no murmur Resp: CTABL, no wheezes, non-labored Ext: No edema, warm.  Patient exhibits extreme reaction to any range of motion for the right glenohumeral joint.  Additionally there is pain for mild palpation over the deltoid region and for rotation of the glenohumeral joint as well as abduction abduction flexion and extension.  Strength is 1/5 for the right glenohumeral joint.  She resists movement of any kind of the right upper extremity due to the severe pain at the shoulder however with some coaxing she can move at the elbow without pain and the wrist has normal range of motion.  The right upper extremity is neurovascularly intact. Neuro: Alert and oriented, No gross deficits  Assessment and plan:  1. Acute pain of right shoulder     Meds ordered this encounter  Medications  . predniSONE (STERAPRED UNI-PAK 48 TAB) 10 MG (48) TBPK tablet    Sig: Take as directed    Dispense:  48 tablet    Refill:  0    She should try pendulum exercises briefly 4 times a day.  Otherwise keep the arm in the sling.  Follow up 2 weeks.  Apply ice frequently  Claretta Fraise,  MD

## 2017-03-09 ENCOUNTER — Encounter: Payer: Self-pay | Admitting: Family Medicine

## 2017-03-09 ENCOUNTER — Telehealth: Payer: Self-pay | Admitting: Family Medicine

## 2017-03-09 NOTE — Telephone Encounter (Signed)
Spoke to pt and advised her that it is normal for blood sugar to go up with the Prednisone and to monitor it. If she starts to feel shaky, clammy, dizzy or feels as though she may pass out to call us back. Pt voiced understanding.

## 2017-04-16 ENCOUNTER — Other Ambulatory Visit: Payer: Self-pay | Admitting: Family Medicine

## 2017-04-27 ENCOUNTER — Ambulatory Visit (INDEPENDENT_AMBULATORY_CARE_PROVIDER_SITE_OTHER): Payer: Medicare Other

## 2017-04-27 ENCOUNTER — Telehealth: Payer: Self-pay | Admitting: Family Medicine

## 2017-04-27 ENCOUNTER — Encounter: Payer: Self-pay | Admitting: Family Medicine

## 2017-04-27 ENCOUNTER — Ambulatory Visit (INDEPENDENT_AMBULATORY_CARE_PROVIDER_SITE_OTHER): Payer: Medicare Other | Admitting: Family Medicine

## 2017-04-27 VITALS — BP 121/70 | HR 61 | Ht 63.0 in | Wt 187.0 lb

## 2017-04-27 DIAGNOSIS — G8929 Other chronic pain: Secondary | ICD-10-CM

## 2017-04-27 DIAGNOSIS — E1140 Type 2 diabetes mellitus with diabetic neuropathy, unspecified: Secondary | ICD-10-CM | POA: Diagnosis not present

## 2017-04-27 DIAGNOSIS — M25512 Pain in left shoulder: Secondary | ICD-10-CM

## 2017-04-27 DIAGNOSIS — M19012 Primary osteoarthritis, left shoulder: Secondary | ICD-10-CM | POA: Diagnosis not present

## 2017-04-27 DIAGNOSIS — I10 Essential (primary) hypertension: Secondary | ICD-10-CM | POA: Diagnosis not present

## 2017-04-27 DIAGNOSIS — M25511 Pain in right shoulder: Principal | ICD-10-CM

## 2017-04-27 DIAGNOSIS — M1990 Unspecified osteoarthritis, unspecified site: Secondary | ICD-10-CM

## 2017-04-27 DIAGNOSIS — E782 Mixed hyperlipidemia: Secondary | ICD-10-CM

## 2017-04-27 DIAGNOSIS — Z789 Other specified health status: Secondary | ICD-10-CM

## 2017-04-27 MED ORDER — ACCU-CHEK FASTCLIX LANCET KIT: PACK | 0 refills | Status: AC

## 2017-04-27 MED ORDER — PREDNISONE 10 MG (48) PO TBPK: ORAL_TABLET | ORAL | 0 refills | Status: DC

## 2017-04-27 NOTE — Progress Notes (Signed)
Subjective:  Patient ID: Carmen [name], female    DOB: [birth year]  Age: 77 y.o. MRN: [id number]  CC: Shoulder Pain (pt here today c/o right shoulder pain that got better while on Prednisone but went right back to hurting after completing the prednisone)   HPI Carmen [name] presents for recurrent pain in the right shoulder.  It was much better while taking prednisone.  It has started to rebound since then.  Over the last few weeks she has noted an insidious increase in pain.  It is now moderate.  It is not as bad as it was when she was here 2 months ago but it seems to be getting worse and she is afraid it will get there.  At that time she could hardly move the arm at all at this time she has some limitation of abduction but she can do a great deal of her activities the hardest thing for her is to reach behind her to pull up her pants.  Additionally the left shoulder has been hurting chronically as well.  It is just never been as bad as the right.  She describes the pain bilaterally as a burning sensation that goes down her arm at times.  She saw an arthritis specialist 2 or 3 years ago that told her she had some arthritis in her hands but nothing else.    Additionally she has lost her lancet device for her blood sugar readings and would like to have that replaced by prescription today.  Patient has been unable to tolerate statins including Livalo Lipitor and Crestor.  She has not tolerated Zocor or Zetia either.  Therefore she was started on Praluent.  She discontinued it when her shoulder started hurting but that did not seem to help so she would like to go back on it.  She says that the medication is waiting to be shipped as soon as the doctor approves it.  Depression screen [facility] 2/9 04/27/2017 03/08/2017 11/06/2016  Decreased Interest 0 0 2  Down, Depressed, Hopeless 0 0 2  PHQ - 2 Score 0 0 4  Altered sleeping - - 2  Tired, decreased energy - - 2  Change in appetite - - 1  Feeling bad  or failure about yourself  - - 0  Trouble concentrating - - 0  Moving slowly or fidgety/restless - - 0  Suicidal thoughts - - 0  PHQ-9 Score - - 9  Difficult doing work/chores - - Somewhat difficult    History Carmen [name] has a past medical history of Anemia, Arthritis, CAD (coronary artery disease), Cancer ([place]) (11/2015), Diabetes mellitus without complication ([place]), Diverticulosis, Gout, Hemochromatosis, hereditary ([place]) (11/24/2012), Hyperlipidemia, Hypertension, and Neuromuscular disorder ([place]).   She has a past surgical history that includes Lipoma excision; Breast surgery; Cardiac catheterization (06/03/2005); and Coronary artery bypass graft (2007).   Her family history includes Arthritis in her sister, sister, and sister; Cancer (age of onset: 56) in her sister; Diabetes in her brother, brother, brother, and father; Diverticulitis in her sister; Hemachromatosis in her brother, sister, sister, and sister; Hypertension in her father and mother; Lupus in her sister; Neuropathy in her mother; Stroke in her father and mother.She reports that  has never smoked. she has never used smokeless tobacco. She reports that she does not drink alcohol or use drugs.    ROS Review of Systems  Constitutional: Positive for activity change. Negative for appetite change and fever.  HENT: Negative for congestion.   Eyes: Negative  for visual disturbance.  Respiratory: Negative for cough.   Cardiovascular: Negative for chest pain.  Gastrointestinal: Negative for abdominal pain.  Genitourinary: Negative.   Musculoskeletal: Positive for arthralgias and myalgias.  Neurological: Negative for weakness and numbness.  Psychiatric/Behavioral: Negative.     Objective:  BP 121/70   Pulse 61   Ht _0  (1.6 m)   Wt 187 lb (84.8 kg)   BMI 33.13 kg/m   BP Readings from Last 3 Encounters:  04/27/17 121/70  03/08/17 130/66  01/22/17 (!) 141/62    Wt Readings from Last 3 Encounters:  04/27/17 187 lb (84.8 kg)   03/08/17 184 lb (83.5 kg)  01/22/17 182 lb (82.6 kg)     Physical Exam  Constitutional: She appears well-developed and well-nourished. No distress.  HENT:  Head: Normocephalic.  Cardiovascular: Normal rate and regular rhythm.  No murmur heard. Pulmonary/Chest: Effort normal and breath sounds normal.  Musculoskeletal: She exhibits tenderness. She exhibits no edema.  Patient has decreased range of motion at the right upper extremity for external rotation.  She can go backwards about 45 degrees.  With the arm abducted she can go about half that.  However, she can only abduct about 30-45 degrees as well.  The left upper extremity has full range of motion but some pain with movement noted.  Both upper extremities are neurovascularly intact for pulses and light touch.  Strength is 5/5 on the left.  5/5 bilaterally for grip.      Assessment & Plan:   Atiya was seen today for shoulder pain.  Diagnoses and all orders for this visit:  Chronic pain of both shoulders -     DG Shoulder Left; Future -     DG Shoulder Right; Future -     Ambulatory referral to Physical Therapy  Essential hypertension  Controlled type 2 diabetes mellitus with diabetic neuropathy, without long-term current use of insulin (HCC)  Mixed hyperlipidemia  Statin intolerance  Arthritis  Other orders -     predniSONE (STERAPRED UNI-PAK 48 TAB) 10 MG (48) TBPK tablet; Take as directed -     Lancets Misc. (ACCU-CHEK FASTCLIX LANCET) KIT; Use to check Blood Sugars    I am having Carmen [name] start on ACCU-CHEK FASTCLIX LANCET. I am also having her maintain her aspirin, fluticasone, Vitamin D (Ergocalciferol), VOLTAREN, allopurinol, colchicine, metoprolol tartrate, olmesartan, omega-3 acid ethyl esters, glucose blood, pregabalin, triamterene-hydrochlorothiazide, JANUVIA, and predniSONE.  Allergies as of 04/27/2017      Reactions   Cymbalta [duloxetine Hcl] Other (See Comments)   Profuse sweating    Diclofenac Diarrhea   Crestor [rosuvastatin] Other (See Comments)   Muscle aches on 5 mg daily and 10 mg twice weekly   Lipitor [atorvastatin] Other (See Comments)   Muscle aches   Livalo [pitavastatin] Other (See Comments)      Sulfa Antibiotics Rash   Zetia [ezetimibe] Other (See Comments)   Muscle aches   Zocor [simvastatin] Other (See Comments)   Muscle aches       Medication List        Accurate as of 04/27/17  2:45 PM. Always use your most recent med list.          ACCU-CHEK FASTCLIX LANCET Kit Use to check Blood Sugars   allopurinol 100 MG tablet Commonly known as:  ZYLOPRIM TAKE (1) TABLET TWICE A DAY.   aspirin 81 MG tablet Take 81 mg by mouth daily.   colchicine 0.6 MG tablet TAKE (1) TABLET DAILY AS  NEEDED.   fluticasone 50 MCG/ACT nasal spray Commonly known as:  FLONASE SPRAY 1 SPRAY IN EACH NOSTRIL ONCE DAILY.   glucose blood test strip Commonly known as:  ONE TOUCH ULTRA TEST CHECK BLOOD SUGAR 2 TIMES A DAY   JANUVIA 100 MG tablet Generic drug:  sitaGLIPtin TAKE 1 TABLET DAILY   metoprolol tartrate 25 MG tablet Commonly known as:  LOPRESSOR TAKE (1) TABLET TWICE A DAY.   olmesartan 20 MG tablet Commonly known as:  BENICAR Take 1 tablet (20 mg total) by mouth daily.   omega-3 acid ethyl esters 1 g capsule Commonly known as:  LOVAZA TAKE (2) CAPSULES TWICE DAILY.   predniSONE 10 MG (48) Tbpk tablet Commonly known as:  STERAPRED UNI-PAK 48 TAB Take as directed   pregabalin 50 MG capsule Commonly known as:  LYRICA TAKE  (1)  CAPSULE  TWICE DAILY.   triamterene-hydrochlorothiazide 37.5-25 MG capsule Commonly known as:  DYAZIDE TAKE (1) CAPSULE DAILY   Vitamin D (Ergocalciferol) 50000 units Caps capsule Commonly known as:  DRISDOL Take 1 capsule (50,000 Units total) by mouth every 7 (seven) days.   VOLTAREN 1 % Gel Generic drug:  diclofenac sodium APPLY 4 GRAMS TO AFFECTED AREA(S) 4 TIMES A DAY AS NEEDED        Follow-up: Return  in about 6 weeks (around 06/08/2017).  Claretta Fraise, M.D.

## 2017-04-29 ENCOUNTER — Inpatient Hospital Stay: Payer: Medicare Other | Attending: Hematology & Oncology | Admitting: Hematology & Oncology

## 2017-04-29 ENCOUNTER — Encounter: Payer: Self-pay | Admitting: Hematology & Oncology

## 2017-04-29 ENCOUNTER — Inpatient Hospital Stay: Payer: Medicare Other

## 2017-04-29 ENCOUNTER — Other Ambulatory Visit: Payer: Self-pay

## 2017-04-29 DIAGNOSIS — Z7982 Long term (current) use of aspirin: Secondary | ICD-10-CM | POA: Diagnosis not present

## 2017-04-29 DIAGNOSIS — Z79899 Other long term (current) drug therapy: Secondary | ICD-10-CM | POA: Diagnosis not present

## 2017-04-29 LAB — CMP (CANCER CENTER ONLY)
ALBUMIN: 3.6 g/dL (ref 3.5–5.0)
ALK PHOS: 68 U/L (ref 26–84)
ALT: 24 U/L (ref 0–55)
AST: 25 U/L (ref 5–34)
Anion gap: 10 (ref 5–15)
BUN: 26 mg/dL — AB (ref 7–22)
CALCIUM: 10.3 mg/dL (ref 8.0–10.3)
CO2: 25 mmol/L (ref 18–33)
Chloride: 107 mmol/L (ref 98–108)
Creatinine: 1.8 mg/dL — ABNORMAL HIGH (ref 0.60–1.10)
Glucose, Bld: 154 mg/dL — ABNORMAL HIGH (ref 73–118)
POTASSIUM: 4.1 mmol/L (ref 3.3–4.7)
SODIUM: 142 mmol/L (ref 128–145)
TOTAL PROTEIN: 7.4 g/dL (ref 6.4–8.1)
Total Bilirubin: 0.7 mg/dL (ref 0.2–1.2)

## 2017-04-29 LAB — CBC WITH DIFFERENTIAL (CANCER CENTER ONLY)
BASOS PCT: 0 %
Basophils Absolute: 0.1 10*3/uL (ref 0.0–0.1)
EOS ABS: 0.1 10*3/uL (ref 0.0–0.5)
EOS PCT: 1 %
HCT: 41 % (ref 34.8–46.6)
Hemoglobin: 14 g/dL (ref 11.6–15.9)
Lymphocytes Relative: 40 %
Lymphs Abs: 5.5 10*3/uL — ABNORMAL HIGH (ref 0.9–3.3)
MCH: 30.9 pg (ref 26.0–34.0)
MCHC: 34.1 g/dL (ref 32.0–36.0)
MCV: 90.5 fL (ref 81.0–101.0)
MONO ABS: 0.9 10*3/uL (ref 0.1–0.9)
Monocytes Relative: 7 %
Neutro Abs: 7.1 10*3/uL — ABNORMAL HIGH (ref 1.5–6.5)
Neutrophils Relative %: 52 %
PLATELETS: 175 10*3/uL (ref 145–400)
RBC: 4.53 MIL/uL (ref 3.70–5.32)
RDW: 13.4 % (ref 11.1–15.7)
WBC: 13.7 10*3/uL — AB (ref 3.9–10.0)

## 2017-04-29 NOTE — Telephone Encounter (Signed)
Do you happen to know anything about this.

## 2017-04-29 NOTE — Progress Notes (Signed)
Hematology and Oncology Follow Up Visit  ARVADA SEABORN [id number] 26-Jul-1940 77 y.o. 04/29/2017   Principle Diagnosis:  Hemochromatosis (double heterozygote for C282Y and S65C mutations).  Current Therapy:    Phlebotomy to maintain ferritin less than 100     Interim History:  Ms.  Catapano is back for followup.  Everything seems to be doing okay for her.  It is now been about a year since her husband passed away unexpectedly.  We last saw her back in November, her ferritin was 104 with an iron saturation of 29%.  I think that we phlebotomized her at that point.  Her appetite is doing okay.  She is eating a little bit better.  She has had no problems with cough or shortness of breath.  She had she had her flu shot today.  She has had no rashes.  Is been no leg swelling.  She has had no fever.  Overall, I say her performance status is ECOG 1.    Medications:  Current Outpatient Medications:  .  allopurinol (ZYLOPRIM) 100 MG tablet, TAKE (1) TABLET TWICE A DAY., Disp: 180 tablet, Rfl: 1 .  aspirin 81 MG tablet, Take 81 mg by mouth daily., Disp: , Rfl:  .  colchicine 0.6 MG tablet, TAKE (1) TABLET DAILY AS NEEDED., Disp: 30 tablet, Rfl: 1 .  fluticasone (FLONASE) 50 MCG/ACT nasal spray, SPRAY 1 SPRAY IN EACH NOSTRIL ONCE DAILY., Disp: 16 g, Rfl: 4 .  glucose blood (ONE TOUCH ULTRA TEST) test strip, CHECK BLOOD SUGAR 2 TIMES A DAY, Disp: 100 each, Rfl: prn .  JANUVIA 100 MG tablet, TAKE 1 TABLET DAILY, Disp: 90 tablet, Rfl: 0 .  Lancets Misc. (ACCU-CHEK FASTCLIX LANCET) KIT, Use to check Blood Sugars, Disp: 1 kit, Rfl: 0 .  metoprolol tartrate (LOPRESSOR) 25 MG tablet, TAKE (1) TABLET TWICE A DAY., Disp: 180 tablet, Rfl: 1 .  olmesartan (BENICAR) 20 MG tablet, Take 1 tablet (20 mg total) by mouth daily., Disp: 90 tablet, Rfl: 1 .  omega-3 acid ethyl esters (LOVAZA) 1 g capsule, TAKE (2) CAPSULES TWICE DAILY., Disp: 360 capsule, Rfl: 1 .  predniSONE (STERAPRED UNI-PAK 48 TAB) 10 MG  (48) TBPK tablet, Take as directed, Disp: 48 tablet, Rfl: 0 .  pregabalin (LYRICA) 50 MG capsule, TAKE  (1)  CAPSULE  TWICE DAILY., Disp: 60 capsule, Rfl: 3 .  triamterene-hydrochlorothiazide (DYAZIDE) 37.5-25 MG capsule, TAKE (1) CAPSULE DAILY, Disp: 90 capsule, Rfl: 1 .  Vitamin D, Ergocalciferol, (DRISDOL) 50000 units CAPS capsule, Take 1 capsule (50,000 Units total) by mouth every 7 (seven) days., Disp: 13 capsule, Rfl: 3 .  VOLTAREN 1 % GEL, APPLY 4 GRAMS TO AFFECTED AREA(S) 4 TIMES A DAY AS NEEDED, Disp: 100 g, Rfl: 2  Allergies:  Allergies  Allergen Reactions  . Cymbalta [Duloxetine Hcl] Other (See Comments)    Profuse sweating  . Diclofenac Diarrhea  . Crestor [Rosuvastatin] Other (See Comments)    Muscle aches on 5 mg daily and 10 mg twice weekly  . Lipitor [Atorvastatin] Other (See Comments)    Muscle aches  . Livalo [Pitavastatin] Other (See Comments)       . Sulfa Antibiotics Rash  . Zetia [Ezetimibe] Other (See Comments)    Muscle aches  . Zocor [Simvastatin] Other (See Comments)    Muscle aches     Past Medical History, Surgical history, Social history, and Family History were reviewed and updated.  Review of Systems: Review of Systems  Constitutional: Negative.  HENT: Negative.   Eyes: Negative.   Respiratory: Negative.   Cardiovascular: Negative.   Gastrointestinal: Negative.   Genitourinary: Negative.   Musculoskeletal: Negative.   Skin: Negative.   Neurological: Negative.   Endo/Heme/Allergies: Negative.   Psychiatric/Behavioral: Negative.     Physical Exam:  weight is 187 lb (84.8 kg). Her oral temperature is 98.1 F (36.7 C). Her blood pressure is 143/70 (abnormal) and her pulse is 55 (abnormal). Her respiration is 16 and oxygen saturation is 96%.   Physical Exam  Constitutional: She is oriented to person, place, and time.  HENT:  Head: Normocephalic and atraumatic.  Mouth/Throat: Oropharynx is clear and moist.  Eyes: EOM are normal. Pupils  are equal, round, and reactive to light.  Neck: Normal range of motion.  Cardiovascular: Normal rate, regular rhythm and normal heart sounds.  Pulmonary/Chest: Effort normal and breath sounds normal.  Abdominal: Soft. Bowel sounds are normal.  Musculoskeletal: Normal range of motion. She exhibits no edema, tenderness or deformity.  Lymphadenopathy:    She has no cervical adenopathy.  Neurological: She is alert and oriented to person, place, and time.  Skin: Skin is warm and dry. No rash noted. No erythema.  Psychiatric: She has a normal mood and affect. Her behavior is normal. Judgment and thought content normal.  Vitals reviewed.    Lab Results  Component Value Date   WBC 13.7 (H) 04/29/2017   HGB 14.5 01/22/2017   HCT 41.0 04/29/2017   MCV 90.5 04/29/2017   PLT 175 04/29/2017     Chemistry      Component Value Date/Time   NA 142 04/29/2017 1438   NA 147 (H) 01/22/2017 1451   NA 140 12/31/2015 1052   K 4.1 04/29/2017 1438   K 4.9 (H) 01/22/2017 1451   K 4.2 12/31/2015 1052   CL 107 04/29/2017 1438   CL 106 01/22/2017 1451   CO2 25 04/29/2017 1438   CO2 29 01/22/2017 1451   CO2 23 12/31/2015 1052   BUN 26 (H) 04/29/2017 1438   BUN 23 (H) 01/22/2017 1451   BUN 21.4 12/31/2015 1052   CREATININE 1.80 (H) 04/29/2017 1438   CREATININE 1.6 (H) 01/22/2017 1451   CREATININE 1.4 (H) 12/31/2015 1052      Component Value Date/Time   CALCIUM 10.3 04/29/2017 1438   CALCIUM 10.3 01/22/2017 1451   CALCIUM 9.7 12/31/2015 1052   ALKPHOS 68 04/29/2017 1438   ALKPHOS 65 01/22/2017 1451   ALKPHOS 87 12/31/2015 1052   AST 25 04/29/2017 1438   AST 31 12/31/2015 1052   ALT 24 04/29/2017 1438   ALT 25 01/22/2017 1451   ALT 36 12/31/2015 1052   BILITOT 0.7 04/29/2017 1438   BILITOT 0.42 12/31/2015 1052        Impression and Plan: Ms. Dannenberg is 77 year old white female with hemachromatosis. She is a double heterozygote.  We will have to see what her iron studies look like.   Hopefully, she will not need to be phlebotomized.  We will plan to get her back to see [name] in another 3 months.  Volanda Napoleon, MD 2/7/20193:47 PM

## 2017-04-30 ENCOUNTER — Telehealth: Payer: Self-pay | Admitting: *Deleted

## 2017-04-30 LAB — IRON AND TIBC
Iron: 122 ug/dL (ref 41–142)
Saturation Ratios: 47 % (ref 21–57)
TIBC: 263 ug/dL (ref 236–444)
UIBC: 140 ug/dL

## 2017-04-30 LAB — FERRITIN: Ferritin: 118 ng/mL (ref 9–269)

## 2017-04-30 NOTE — Telephone Encounter (Addendum)
-----   Message from Volanda Napoleon, MD sent at 04/30/2017 12:11 PM EST ----- Call - ferritin is too high!!  Need 2 phlebotomy appts!! Appt made for Tuesday 05/04/17

## 2017-05-03 ENCOUNTER — Ambulatory Visit: Payer: Medicare Other | Attending: Family Medicine | Admitting: Physical Therapy

## 2017-05-03 ENCOUNTER — Encounter: Payer: Self-pay | Admitting: Physical Therapy

## 2017-05-03 DIAGNOSIS — G8929 Other chronic pain: Secondary | ICD-10-CM | POA: Diagnosis not present

## 2017-05-03 DIAGNOSIS — M25512 Pain in left shoulder: Secondary | ICD-10-CM | POA: Insufficient documentation

## 2017-05-03 DIAGNOSIS — R293 Abnormal posture: Secondary | ICD-10-CM | POA: Diagnosis not present

## 2017-05-03 DIAGNOSIS — M25511 Pain in right shoulder: Secondary | ICD-10-CM | POA: Diagnosis not present

## 2017-05-03 NOTE — Therapy (Signed)
[facility] [place], [place], [address] Phone: [phone]   Fax:  [phone]  Physical Therapy Evaluation  Patient Details  Name: Carmen [name] MRN: [id number] Date of Birth: [birth year] Referring Provider: Dr. Claretta Fraise   Encounter Date: 05/03/2017  PT End of Session - 05/03/17 1605    Visit Number  1    Number of Visits  12    Date for PT Re-Evaluation  06/14/17    Authorization Type  FOTO every 5th visit    PT Start Time  1300    PT Stop Time  1347    PT Time Calculation (min)  47 min    Activity Tolerance  Patient tolerated treatment well    Behavior During Therapy  [facility] for tasks assessed/performed       Past Medical History:  Diagnosis Date  . Anemia   . Arthritis   . CAD (coronary artery disease)   . Cancer ([place]) 11/2015   melanoma right upper arm  . Diabetes mellitus without complication ([place])   . Diverticulosis   . Gout   . Hemochromatosis, hereditary ([place]) 11/24/2012  . Hyperlipidemia   . Hypertension   . Neuromuscular disorder ([place])    peripheral neuropathy    Past Surgical History:  Procedure Laterality Date  . BREAST SURGERY     left breast lump--benign  . CARDIAC CATHETERIZATION  06/03/2005  . CORONARY ARTERY BYPASS GRAFT  2007   x2  . LIPOMA EXCISION     back    There were no vitals filed for this visit.   Subjective Assessment - 05/03/17 1613    Subjective  Patient presents to physical therapy with complaints of bilateral shoulder pain and weakness; right shoulder more affected than left. Patient stated symptoms began around August/September of 2018 for right shoulder/arm and November 2018 for lefts shoulder /arm. She attributed the symptoms due to surgery to remove melanomas in right arm and right side of mid back and because she is required to perform all house hold duties independently since her husband's passing last year. Patient stated her chief complaint is weakness and  pain while lifting. Patient stated prednisone and massaging out the arm has helped but did not have long lasting pain relief. Patient stated she had received an X-Ray for both shoulders and the doctor stated there was arthritis in bilateral acromioclaviclar joints. Patient would like to decrease pain, improve strength to perform home activities with less difficulty.    Pertinent History  HTN, DM,  surgeries to remove melanomas, hx of heart conditions (open heart surgery 2007)    Limitations  House hold activities;Lifting    Patient Stated Goals  decrease pain, get stronger to do things at home    Currently in Pain?  Yes    Pain Score  4     Pain Location  Shoulder    Pain Orientation  Right    Pain Descriptors / Indicators  Sore;Sharp    Pain Type  Chronic pain    Pain Onset  More than a month ago    Pain Frequency  Constant    Aggravating Factors   lifting    Pain Relieving Factors  medications and ice    Multiple Pain Sites  No    Pain Score  4    Pain Location  Shoulder    Pain Orientation  Left    Pain Descriptors / Indicators  Sore    Pain Type  Chronic pain  Pain Onset  More than a month ago    Pain Frequency  Constant         [facility] PT Assessment - 05/03/17 0001      Assessment   Referring Provider  Dr. Claretta Fraise    Onset Date/Surgical Date  11/04/16 mid August/September 2018    Hand Dominance  Right    Next MD Visit  March 2019    Prior Therapy  N/A      Balance Screen   Has the patient fallen in the past 6 months  Yes    How many times?  1    Has the patient had a decrease in activity level because of a fear of falling?   No    Is the patient reluctant to leave their home because of a fear of falling?   No      Home Social worker  Private residence    Living Arrangements  Alone      Prior Function   Level of Independence  Independent      Observation/Other Assessments   Focus on Therapeutic Outcomes (FOTO)   45% limitation       Sensation   Light Touch  Appears Intact      Posture/Postural Control   Posture/Postural Control  Postural limitations    Postural Limitations  Rounded Shoulders;Forward head      ROM / Strength   AROM / PROM / Strength  AROM;PROM;Strength      AROM   Overall AROM   Due to pain    Overall AROM Comments  L UE WFL, Right shoulder flexion (+) pain      PROM   Overall PROM   Within functional limits for tasks performed    PROM Assessment Site  Shoulder    Right/Left Shoulder  Right;Left    Right Shoulder Flexion  626 Degrees (+) clicking in acromioclavicular joint    Right Shoulder ABduction  140 Degrees    Left Shoulder Flexion  168 Degrees    Left Shoulder ABduction  172 Degrees      Strength   Overall Strength  Within functional limits for tasks performed    Overall Strength Comments       Strength Assessment Site  Shoulder    Right/Left Shoulder  Left;Right    Right Shoulder Flexion  4/5    Right Shoulder ABduction  4/5    Right Shoulder Internal Rotation  5/5    Right Shoulder External Rotation  5/5    Left Shoulder Flexion  4/5    Left Shoulder ABduction  4+/5    Left Shoulder Internal Rotation  5/5    Left Shoulder External Rotation  5/5      Palpation   Palpation comment  Tender to palpation along right upper trapezius, R spine of the scapula and R deltoid insertion      Special Tests    Special Tests  Rotator Cuff Impingement;Laxity/Instability Tests    Other special tests  2+ reflexes bilaterally    Rotator Cuff Impingment tests  Neer impingement test;Painful Arc of Motion      Neer Impingement test    Findings  Negative      Painful Arc of Motion   Findings  Negative    Side  Right      Transfers   Transfers  Independent with all Transfers             Objective measurements completed on examination: See  above findings.              PT Education - 05/03/17 1555    Education provided  Yes    Education Details  HEP, scapular  retractions    Person(s) Educated  Patient    Methods  Handout;Explanation;Demonstration    Comprehension  Verbalized understanding;Returned demonstration       PT Short Term Goals - 05/03/17 1558      PT SHORT TERM GOAL #1   Title  Patient will be independent with HEP    Time  2    Period  Weeks    Status  New    Target Date  05/17/17      PT SHORT TERM GOAL #2   Title  Patient will decrease pain to less than or equal to 2/10 with ADLs.    Time  2    Period  Weeks    Status  New    Target Date  05/17/17        PT Long Term Goals - 05/03/17 1559      PT LONG TERM GOAL #1   Title  Patient will improve right shoulder flexion AROM to >170 degrees with no pain to perform ADLs.    Time  6    Period  Weeks    Status  New    Target Date  06/14/17      PT LONG TERM GOAL #2   Title  Patient will improve bilateral shoulder flexion strength to 5/5 to perform functional activities.    Time  6    Period  Weeks    Status  New    Target Date  06/14/17      PT LONG TERM GOAL #3   Title  Patient will self report ability to lift bags of groceries with less than or equal to 2/10 pain.    Time  6    Period  Weeks    Status  New    Target Date  06/14/17             Plan - 05/03/17 1623    Clinical Impression Statement  Patient is a 77 year old female who presents to physical therapy with complaints of bilateral shoulder pain and weakness. Patient noted with rounded shoulders and a forward head while sitting and slightly standing. Patient's overall AROM WFL in left shoulder; AROM limited by pain  and some crepitus and in the R shoulder. Patient's PROM R shoulder flexion PROM limited secondary to pain. Patient's strength is 5/5 in all planes except bilateral shoulder flexion 4/5 (+) pain in right shoulder. Negative Neer's impingement test indicating RTC intact. Patient would benefit from skilled physical therapy to improve pain and AROM of bilateral shoulders to return to PLOF and  perform ADLs and household activities with no difficulty.     Clinical Presentation  Stable    Clinical Decision Making  Low    Rehab Potential  Good    PT Frequency  2x / week    PT Duration  6 weeks    PT Treatment/Interventions  ADLs/Self Care Home Management;Cryotherapy;Electrical Stimulation;Iontophoresis 4mg /ml Dexamethasone;Moist Heat;Ultrasound;Therapeutic exercise;Therapeutic activities;Neuromuscular re-education;Manual techniques;Passive range of motion;Dry needling    PT Next Visit Plan  Begin AROM exercises and strengthening exercises per patient tolerance, modalities PRN for pain relief.    PT Home Exercise Plan  Scapular retractions    Consulted and Agree with Plan of Care  Patient       Patient will  benefit from skilled therapeutic intervention in order to improve the following deficits and impairments:  Pain, Decreased range of motion, Postural dysfunction, Impaired UE functional use  Visit Diagnosis: Chronic right shoulder pain  Chronic left shoulder pain  Abnormal posture     Problem List Patient Active Problem List   Diagnosis Date Noted  . Chronic pain of both shoulders 04/27/2017  . Vitamin D deficiency 01/28/2016  . Controlled type 2 diabetes mellitus with diabetic neuropathy, without long-term current use of insulin ([place]) 11/12/2015  . Statin intolerance 08/18/2013  . Neuromuscular disorder ([place])   . Hemochromatosis, hereditary ([place]) 11/24/2012  . S/P CABG x 2 08/09/2012  . PVC's (premature ventricular contractions) 08/09/2012  . Gout 06/20/2012  . HTN (hypertension) 06/20/2012  . HLD (hyperlipidemia) 06/20/2012  . CAD (coronary artery disease) 06/20/2012  . Arthritis 06/20/2012   Gabriela Eves, PT, DPT 05/03/2017, 4:28 PM  [facility] [address] [place], [place], [address] Phone: [phone]   Fax:  [phone]  Name: Carmen [name] MRN: [id number] Date of Birth: 1940/07/16

## 2017-05-03 NOTE — Telephone Encounter (Signed)
Praluent was approved for patient

## 2017-05-03 NOTE — Patient Instructions (Signed)
   Dereck Agerton, PT, DPT [facility] [address] [place], [place], [address] Phone: [phone]   Fax:  [phone]  

## 2017-05-04 ENCOUNTER — Inpatient Hospital Stay: Payer: Medicare Other

## 2017-05-04 ENCOUNTER — Other Ambulatory Visit: Payer: Self-pay | Admitting: Family

## 2017-05-04 DIAGNOSIS — Z79899 Other long term (current) drug therapy: Secondary | ICD-10-CM | POA: Diagnosis not present

## 2017-05-04 DIAGNOSIS — Z7982 Long term (current) use of aspirin: Secondary | ICD-10-CM | POA: Diagnosis not present

## 2017-05-04 NOTE — Progress Notes (Signed)
[name] presents today for phlebotomy per MD orders. Phlebotomy procedure started at 1151 and ended at 1212 with 510  grams removed, via 20G to [place].  Patient observed for 30 minutes after procedure without any incident. Patient tolerated procedure well. Diet and nutrition offered.  IV needle removed intact.

## 2017-05-06 ENCOUNTER — Ambulatory Visit: Payer: Medicare Other | Admitting: *Deleted

## 2017-05-06 DIAGNOSIS — M25512 Pain in left shoulder: Secondary | ICD-10-CM | POA: Diagnosis not present

## 2017-05-06 DIAGNOSIS — G8929 Other chronic pain: Secondary | ICD-10-CM | POA: Diagnosis not present

## 2017-05-06 DIAGNOSIS — M25511 Pain in right shoulder: Principal | ICD-10-CM

## 2017-05-06 DIAGNOSIS — R293 Abnormal posture: Secondary | ICD-10-CM | POA: Diagnosis not present

## 2017-05-06 NOTE — Therapy (Signed)
[facility] [place], [place], [address] Phone: [phone]   Fax:  [phone]  Physical Therapy Treatment  Patient Details  Name: Carmen [name] MRN: [id number] Date of Birth: Aug 09, 1940 Referring Provider: Dr. Claretta Fraise   Encounter Date: 05/06/2017  PT End of Session - 05/06/17 1321    Visit Number  2    Number of Visits  12    Date for PT Re-Evaluation  06/14/17    Authorization Type  FOTO every 5th visit    PT Start Time  1300    PT Stop Time  1346    PT Time Calculation (min)  46 min       Past Medical History:  Diagnosis Date  . Anemia   . Arthritis   . CAD (coronary artery disease)   . Cancer ([place]) 11/2015   melanoma right upper arm  . Diabetes mellitus without complication ([place])   . Diverticulosis   . Gout   . Hemochromatosis, hereditary ([place]) 11/24/2012  . Hyperlipidemia   . Hypertension   . Neuromuscular disorder ([place])    peripheral neuropathy    Past Surgical History:  Procedure Laterality Date  . BREAST SURGERY     left breast lump--benign  . CARDIAC CATHETERIZATION  06/03/2005  . CORONARY ARTERY BYPASS GRAFT  2007   x2  . LIPOMA EXCISION     back    There were no vitals filed for this visit.  Subjective Assessment - 05/06/17 1310    Subjective      minimal pain today because of prednisone        Patient presents to physical therapy with complaints of bilateral shoulder pain and weakness; right shoulder more affected than left. Patient stated symptoms began around August/September of 2018 for right shoulder/arm and November 2018 for lefts shoulder /arm. She attributed the symptoms due to surgery to remove melanomas in right arm and right side of mid back and because she is required to perform all house hold duties independently since her husband's passing last year. Patient stated her chief complaint is weakness and pain while lifting. Patient stated prednisone and massaging out the arm  has helped but did not have long lasting pain relief. Patient stated she had received an X-Ray for both shoulders and the doctor stated there was arthritis in bilateral acromioclaviclar joints. Patient would like to decrease pain, improve strength to perform home activities with less difficulty.    Pertinent History  HTN, DM,  surgeries to remove melanomas, hx of heart conditions (open heart surgery 2007)    Limitations  House hold activities;Lifting    Patient Stated Goals  decrease pain, get stronger to do things at home    Currently in Pain?  Yes    Pain Score  2     Pain Orientation  Right    Pain Descriptors / Indicators  Sore;Sharp    Pain Type  Chronic pain    Pain Onset  More than a month ago    Pain Frequency  Constant    Multiple Pain Sites  Yes    Pain Score  2    Pain Location  Shoulder    Pain Orientation  Left    Pain Descriptors / Indicators  Sore    Pain Type  Chronic pain    Pain Onset  More than a month ago    Pain Frequency  Constant  [facility] PT Treatment/Exercise - 05/06/17 0001      Exercises   Exercises  Shoulder      Shoulder Exercises: Supine   Other Supine Exercises  cane press and flexion 3x10 catching RT shldr at times with press      Shoulder Exercises: Seated   Row  AROM;Both;10 reps;20 reps    External Rotation  AROM;Both;10 reps;20 reps    Other Seated Exercises  table top flexion and extension      Shoulder Exercises: Pulleys   Flexion  -- x 5 mins    Other Pulley Exercises  sitting UE ranger x 3 mins each shldr (6 min total)          HEP for Table top slides  And circles each UE, supine cane and scapulau retractions          PT Short Term Goals - 05/03/17 1558      PT SHORT TERM GOAL #1   Title  Patient will be independent with HEP    Time  2    Period  Weeks    Status  New    Target Date  05/17/17      PT SHORT TERM GOAL #2   Title  Patient will decrease pain to less than or equal to  2/10 with ADLs.    Time  2    Period  Weeks    Status  New    Target Date  05/17/17        PT Long Term Goals - 05/03/17 1559      PT LONG TERM GOAL #1   Title  Patient will improve right shoulder flexion AROM to >170 degrees with no pain to perform ADLs.    Time  6    Period  Weeks    Status  New    Target Date  06/14/17      PT LONG TERM GOAL #2   Title  Patient will improve bilateral shoulder flexion strength to 5/5 to perform functional activities.    Time  6    Period  Weeks    Status  New    Target Date  06/14/17      PT LONG TERM GOAL #3   Title  Patient will self report ability to lift bags of groceries with less than or equal to 2/10 pain.    Time  6    Period  Weeks    Status  New    Target Date  06/14/17            Plan - 05/06/17 1304    Clinical Impression Statement  Pt arrived today doing fairly well with low pain levels in both shldrs due to prednisone. She was able to perform AAROM and AROM exs for both UE's with minimal pain complaints, but RT shldr had a catching sensation with supine cane press-ups. HEP given  for seated and supine exs.    Clinical Presentation  Stable    Clinical Decision Making  Low    Rehab Potential  Good    PT Frequency  2x / week    PT Duration  6 weeks    PT Treatment/Interventions  ADLs/Self Care Home Management;Cryotherapy;Electrical Stimulation;Iontophoresis 4mg /ml Dexamethasone;Moist Heat;Ultrasound;Therapeutic exercise;Therapeutic activities;Neuromuscular re-education;Manual techniques;Passive range of motion;Dry needling    PT Next Visit Plan  Begin AROM exercises and strengthening exercises per patient tolerance, modalities PRN for pain relief.    PT Home Exercise Plan  Scapular retractions  Patient will benefit from skilled therapeutic intervention in order to improve the following deficits and impairments:  Pain, Decreased range of motion, Postural dysfunction, Impaired UE functional use  Visit  Diagnosis: Chronic right shoulder pain  Chronic left shoulder pain  Abnormal posture     Problem List Patient Active Problem List   Diagnosis Date Noted  . Chronic pain of both shoulders 04/27/2017  . Vitamin D deficiency 01/28/2016  . Controlled type 2 diabetes mellitus with diabetic neuropathy, without long-term current use of insulin ([place]) 11/12/2015  . Statin intolerance 08/18/2013  . Neuromuscular disorder ([place])   . Hemochromatosis, hereditary ([place]) 11/24/2012  . S/P CABG x 2 08/09/2012  . PVC's (premature ventricular contractions) 08/09/2012  . Gout 06/20/2012  . HTN (hypertension) 06/20/2012  . HLD (hyperlipidemia) 06/20/2012  . CAD (coronary artery disease) 06/20/2012  . Arthritis 06/20/2012    Rolland Steinert,CHRIS, PTA 05/06/2017, 1:57 PM  [facility] [place], [place], [address] Phone: [phone]   Fax:  [phone]  Name: Carmen [name] MRN: [id number] Date of Birth: 01/09/1941

## 2017-05-11 ENCOUNTER — Encounter: Payer: Self-pay | Admitting: Physical Therapy

## 2017-05-11 ENCOUNTER — Ambulatory Visit: Payer: Medicare Other | Admitting: Physical Therapy

## 2017-05-11 ENCOUNTER — Inpatient Hospital Stay (HOSPITAL_BASED_OUTPATIENT_CLINIC_OR_DEPARTMENT_OTHER): Payer: Medicare Other

## 2017-05-11 DIAGNOSIS — R293 Abnormal posture: Secondary | ICD-10-CM | POA: Diagnosis not present

## 2017-05-11 DIAGNOSIS — Z7982 Long term (current) use of aspirin: Secondary | ICD-10-CM

## 2017-05-11 DIAGNOSIS — M25512 Pain in left shoulder: Secondary | ICD-10-CM

## 2017-05-11 DIAGNOSIS — Z79899 Other long term (current) drug therapy: Secondary | ICD-10-CM

## 2017-05-11 DIAGNOSIS — M25511 Pain in right shoulder: Principal | ICD-10-CM

## 2017-05-11 DIAGNOSIS — G8929 Other chronic pain: Secondary | ICD-10-CM

## 2017-05-11 NOTE — Patient Instructions (Signed)
Therapeutic Phlebotomy Therapeutic phlebotomy is the controlled removal of blood from a person's body for the purpose of treating a medical condition. The procedure is similar to donating blood. Usually, about a pint (470 mL, or 0.47L) of blood is removed. The average adult has 9-12 pints (4.3-5.7 L) of blood. Therapeutic phlebotomy may be used to treat the following medical conditions:  Hemochromatosis. This is a condition in which the blood contains too much iron.  Polycythemia vera. This is a condition in which the blood contains too many red blood cells.  Porphyria cutanea tarda. This is a disease in which an important part of hemoglobin is not made properly. It results in the buildup of abnormal amounts of porphyrins in the body.  Sickle cell disease. This is a condition in which the red blood cells form an abnormal crescent shape rather than a round shape.  Tell a health care provider about:  Any allergies you have.  All medicines you are taking, including vitamins, herbs, eye drops, creams, and over-the-counter medicines.  Any problems you or family members have had with anesthetic medicines.  Any blood disorders you have.  Any surgeries you have had.  Any medical conditions you have. What are the risks? Generally, this is a safe procedure. However, problems may occur, including:  Nausea or light-headedness.  Low blood pressure.  Soreness, bleeding, swelling, or bruising at the needle insertion site.  Infection.  What happens before the procedure?  Follow instructions from your health care provider about eating or drinking restrictions.  Ask your health care provider about changing or stopping your regular medicines. This is especially important if you are taking diabetes medicines or blood thinners.  Wear clothing with sleeves that can be raised above the elbow.  Plan to have someone take you home after the procedure.  You may have a blood sample taken. What  happens during the procedure?  A needle will be inserted into one of your veins.  Tubing and a collection bag will be attached to that needle.  Blood will flow through the needle and tubing into the collection bag.  You may be asked to open and close your hand slowly and continually during the entire collection.  After the specified amount of blood has been removed from your body, the collection bag and tubing will be clamped.  The needle will be removed from your vein.  Pressure will be held on the site of the needle insertion to stop the bleeding.  A bandage (dressing) will be placed over the needle insertion site. The procedure may vary among health care providers and hospitals. What happens after the procedure?  Your recovery will be assessed and monitored.  You can return to your normal activities as directed by your health care provider. This information is not intended to replace advice given to you by your health care provider. Make sure you discuss any questions you have with your health care provider. Document Released: 08/11/2010 Document Revised: 11/09/2015 Document Reviewed: 03/05/2014 Elsevier Interactive Patient Education  2018 [employer]  

## 2017-05-11 NOTE — Therapy (Signed)
[facility] [place], [place], [address] Phone: [phone]   Fax:  [phone]  Physical Therapy Treatment  Patient Details  Name: Carmen [name] MRN: [id number] Date of Birth: 10/27/1940 Referring Provider: Dr. Claretta Fraise   Encounter Date: 05/11/2017  PT End of Session - 05/11/17 1127    Visit Number  3    Number of Visits  12    Date for PT Re-Evaluation  06/14/17    Authorization Type  FOTO every 5th visit    PT Start Time  1118    PT Stop Time  1202    PT Time Calculation (min)  44 min    Activity Tolerance  Patient tolerated treatment well    Behavior During Therapy  [facility] for tasks assessed/performed       Past Medical History:  Diagnosis Date  . Anemia   . Arthritis   . CAD (coronary artery disease)   . Cancer ([place]) 11/2015   melanoma right upper arm  . Diabetes mellitus without complication ([place])   . Diverticulosis   . Gout   . Hemochromatosis, hereditary ([place]) 11/24/2012  . Hyperlipidemia   . Hypertension   . Neuromuscular disorder ([place])    peripheral neuropathy    Past Surgical History:  Procedure Laterality Date  . BREAST SURGERY     left breast lump--benign  . CARDIAC CATHETERIZATION  06/03/2005  . CORONARY ARTERY BYPASS GRAFT  2007   x2  . LIPOMA EXCISION     back    There were no vitals filed for this visit.  Subjective Assessment - 05/11/17 1121    Subjective  Reports that she had to carry in groceries yesterday and tried to carry the lightest bags in RUE but still felt discomfort in RUE.    Pertinent History  HTN, DM,  surgeries to remove melanomas, hx of heart conditions (open heart surgery 2007)    Limitations  House hold activities;Lifting    Patient Stated Goals  decrease pain, get stronger to do things at home    Currently in Pain?  Yes    Pain Score  3     Pain Location  Shoulder    Pain Orientation  Right    Pain Descriptors / Indicators  Sore    Pain Type  Chronic pain     Pain Onset  More than a month ago         [facility] PT Assessment - 05/11/17 0001      Assessment   Onset Date/Surgical Date  11/04/16    Hand Dominance  Right    Next MD Visit  March 2019    Prior Therapy  N/A                  [facility] Adult PT Treatment/Exercise - 05/11/17 0001      Shoulder Exercises: Supine   Protraction  AAROM;Both;20 reps    Flexion  AAROM;Both;20 reps      Shoulder Exercises: Seated   Row  AROM;Both;20 reps      Shoulder Exercises: Pulleys   Flexion  Other (comment) x5 min    Other Pulley Exercises  Seated UE ranger into flexion, circles x30 reps      Shoulder Exercises: ROM/Strengthening   UBE (Upper Arm Bike)  120 RPM x4 min      Shoulder Exercises: Isometric Strengthening   Flexion  5X10"    External Rotation  5X10"    Internal Rotation  5X10"  Manual Therapy   Manual Therapy  Soft tissue mobilization    Soft tissue mobilization  STW to R Pectoralis, deltoids, bicep and triceps to reduce muscle tightness               PT Short Term Goals - 05/03/17 1558      PT SHORT TERM GOAL #1   Title  Patient will be independent with HEP    Time  2    Period  Weeks    Status  New    Target Date  05/17/17      PT SHORT TERM GOAL #2   Title  Patient will decrease pain to less than or equal to 2/10 with ADLs.    Time  2    Period  Weeks    Status  New    Target Date  05/17/17        PT Long Term Goals - 05/03/17 1559      PT LONG TERM GOAL #1   Title  Patient will improve right shoulder flexion AROM to >170 degrees with no pain to perform ADLs.    Time  6    Period  Weeks    Status  New    Target Date  06/14/17      PT LONG TERM GOAL #2   Title  Patient will improve bilateral shoulder flexion strength to 5/5 to perform functional activities.    Time  6    Period  Weeks    Status  New    Target Date  06/14/17      PT LONG TERM GOAL #3   Title  Patient will self report ability to lift bags of groceries with less  than or equal to 2/10 pain.    Time  6    Period  Weeks    Status  New    Target Date  06/14/17            Plan - 05/11/17 1205    Clinical Impression Statement  Patient presented in treatment today with low level R shoulder soreness from unloading groceries yesterday. Isometrics initiated today with only patient complaint during flexion. Patient continues to report anterior R shoulder tightness as well as tightness in R posterior shoulder and R UT. R shoulder adduction stretch off edge of table completed and STW completed in the same technique. Patient reported increased soreness with manual therapy to R superior pectoralis as well as UT. Patient encouraged to continue R shoulder stretch off the table at home for 3x 30 sec. Patient reported reduced of muscle tightness following manual therapy.    Rehab Potential  Good    PT Frequency  2x / week    PT Duration  6 weeks    PT Treatment/Interventions  ADLs/Self Care Home Management;Cryotherapy;Electrical Stimulation;Iontophoresis 4mg /ml Dexamethasone;Moist Heat;Ultrasound;Therapeutic exercise;Therapeutic activities;Neuromuscular re-education;Manual techniques;Passive range of motion;Dry needling    PT Next Visit Plan  Begin AROM exercises and strengthening exercises per patient tolerance, modalities PRN for pain relief.    PT Home Exercise Plan  Scapular retractions    Consulted and Agree with Plan of Care  Patient       Patient will benefit from skilled therapeutic intervention in order to improve the following deficits and impairments:  Pain, Decreased range of motion, Postural dysfunction, Impaired UE functional use  Visit Diagnosis: Chronic right shoulder pain  Chronic left shoulder pain  Abnormal posture     Problem List Patient Active Problem List   Diagnosis Date  Noted  . Chronic pain of both shoulders 04/27/2017  . Vitamin D deficiency 01/28/2016  . Controlled type 2 diabetes mellitus with diabetic neuropathy, without  long-term current use of insulin ([place]) 11/12/2015  . Statin intolerance 08/18/2013  . Neuromuscular disorder ([place])   . Hemochromatosis, hereditary ([place]) 11/24/2012  . S/P CABG x 2 08/09/2012  . PVC's (premature ventricular contractions) 08/09/2012  . Gout 06/20/2012  . HTN (hypertension) 06/20/2012  . HLD (hyperlipidemia) 06/20/2012  . CAD (coronary artery disease) 06/20/2012  . Arthritis 06/20/2012    Standley Brooking, PTA 05/11/2017, 12:14 PM  [facility] [address] [place], [place], [address] Phone: [phone]   Fax:  [phone]  Name: Carmen [name] MRN: [id number] Date of Birth: 06-28-1940

## 2017-05-11 NOTE — Progress Notes (Signed)
[name] presents today for phlebotomy per MD orders. Phlebotomy procedure started at 1520 and ended at 1600. 500 grams removed. Patient observed for 30 minutes after procedure without any incident. Patient tolerated procedure well. IV needle removed intact.

## 2017-05-13 ENCOUNTER — Ambulatory Visit: Payer: Medicare Other | Admitting: *Deleted

## 2017-05-13 DIAGNOSIS — M25512 Pain in left shoulder: Secondary | ICD-10-CM

## 2017-05-13 DIAGNOSIS — R293 Abnormal posture: Secondary | ICD-10-CM

## 2017-05-13 DIAGNOSIS — G8929 Other chronic pain: Secondary | ICD-10-CM | POA: Diagnosis not present

## 2017-05-13 DIAGNOSIS — M25511 Pain in right shoulder: Principal | ICD-10-CM

## 2017-05-13 NOTE — Therapy (Signed)
[facility] [place], [place], [address] Phone: [phone]   Fax:  [phone]  Physical Therapy Treatment  Patient Details  Name: Carmen [name] MRN: [id number] Date of Birth: January 16, 1941 Referring Provider: Dr. Claretta Fraise   Encounter Date: 05/13/2017  PT End of Session - 05/13/17 1257    Visit Number  4    Number of Visits  12    Date for PT Re-Evaluation  06/14/17    Authorization Type  FOTO every 5th visit    PT Start Time  1115    PT Stop Time  1205    PT Time Calculation (min)  50 min       Past Medical History:  Diagnosis Date  . Anemia   . Arthritis   . CAD (coronary artery disease)   . Cancer ([place]) 11/2015   melanoma right upper arm  . Diabetes mellitus without complication ([place])   . Diverticulosis   . Gout   . Hemochromatosis, hereditary ([place]) 11/24/2012  . Hyperlipidemia   . Hypertension   . Neuromuscular disorder ([place])    peripheral neuropathy    Past Surgical History:  Procedure Laterality Date  . BREAST SURGERY     left breast lump--benign  . CARDIAC CATHETERIZATION  06/03/2005  . CORONARY ARTERY BYPASS GRAFT  2007   x2  . LIPOMA EXCISION     back    There were no vitals filed for this visit.  Subjective Assessment - 05/13/17 1121    Subjective  Reports that she had to carry in groceries yesterday and tried to carry the lightest bags in RUE but still felt discomfort in RUE.                         [facility] Adult PT Treatment/Exercise - 05/13/17 0001      Exercises   Exercises  Shoulder      Shoulder Exercises: Pulleys   Flexion  Other (comment) 69min    Other Pulley Exercises  Seated UE ranger into flexion, circles eachway x 6 mins      Shoulder Exercises: ROM/Strengthening   UBE (Upper Arm Bike)  --      Manual Therapy   Manual Therapy  Soft tissue mobilization    Soft tissue mobilization  STW to R UT, Pectoralis, deltoids, bicep and triceps to reduce muscle  tightness     Pec major and minor stretching          PT Short Term Goals - 05/03/17 1558      PT SHORT TERM GOAL #1   Title  Patient will be independent with HEP    Time  2    Period  Weeks    Status  New    Target Date  05/17/17      PT SHORT TERM GOAL #2   Title  Patient will decrease pain to less than or equal to 2/10 with ADLs.    Time  2    Period  Weeks    Status  New    Target Date  05/17/17        PT Long Term Goals - 05/03/17 1559      PT LONG TERM GOAL #1   Title  Patient will improve right shoulder flexion AROM to >170 degrees with no pain to perform ADLs.    Time  6    Period  Weeks    Status  New    Target  Date  06/14/17      PT LONG TERM GOAL #2   Title  Patient will improve bilateral shoulder flexion strength to 5/5 to perform functional activities.    Time  6    Period  Weeks    Status  New    Target Date  06/14/17      PT LONG TERM GOAL #3   Title  Patient will self report ability to lift bags of groceries with less than or equal to 2/10 pain.    Time  6    Period  Weeks    Status  New    Target Date  06/14/17              Patient will benefit from skilled therapeutic intervention in order to improve the following deficits and impairments:     Visit Diagnosis: Chronic right shoulder pain  Chronic left shoulder pain  Abnormal posture     Problem List Patient Active Problem List   Diagnosis Date Noted  . Chronic pain of both shoulders 04/27/2017  . Vitamin D deficiency 01/28/2016  . Controlled type 2 diabetes mellitus with diabetic neuropathy, without long-term current use of insulin ([place]) 11/12/2015  . Statin intolerance 08/18/2013  . Neuromuscular disorder ([place])   . Hemochromatosis, hereditary ([place]) 11/24/2012  . S/P CABG x 2 08/09/2012  . PVC's (premature ventricular contractions) 08/09/2012  . Gout 06/20/2012  . HTN (hypertension) 06/20/2012  . HLD (hyperlipidemia) 06/20/2012  . CAD (coronary artery disease)  06/20/2012  . Arthritis 06/20/2012    Rutger Salton,CHRIS,PTA 05/13/2017, 1:43 PM  [facility] [address] [place], [place], [address] Phone: [phone]   Fax:  [phone]  Name: LOURENE HOSTON MRN: [id number] Date of Birth: Aug 27, 1940

## 2017-05-17 ENCOUNTER — Other Ambulatory Visit: Payer: Self-pay | Admitting: Family Medicine

## 2017-05-18 ENCOUNTER — Ambulatory Visit: Payer: Medicare Other | Admitting: *Deleted

## 2017-05-18 DIAGNOSIS — G8929 Other chronic pain: Secondary | ICD-10-CM | POA: Diagnosis not present

## 2017-05-18 DIAGNOSIS — M25511 Pain in right shoulder: Principal | ICD-10-CM

## 2017-05-18 DIAGNOSIS — R293 Abnormal posture: Secondary | ICD-10-CM

## 2017-05-18 DIAGNOSIS — M25512 Pain in left shoulder: Secondary | ICD-10-CM | POA: Diagnosis not present

## 2017-05-18 NOTE — Therapy (Signed)
[facility] [place], [place], [address] Phone: [phone]   Fax:  [phone]  Physical Therapy Treatment  Patient Details  Name: Carmen [name] MRN: [id number] Date of Birth: December 31, 1940 Referring Provider: Dr. Claretta Fraise   Encounter Date: 05/18/2017  PT End of Session - 05/18/17 1136    Visit Number  5    Number of Visits  12    Date for PT Re-Evaluation  06/14/17    Authorization Type  FOTO every 5th visit    PT Start Time  1115    PT Stop Time  1205    PT Time Calculation (min)  50 min    Activity Tolerance  Patient tolerated treatment well    Behavior During Therapy  [facility] for tasks assessed/performed       Past Medical History:  Diagnosis Date  . Anemia   . Arthritis   . CAD (coronary artery disease)   . Cancer ([place]) 11/2015   melanoma right upper arm  . Diabetes mellitus without complication ([place])   . Diverticulosis   . Gout   . Hemochromatosis, hereditary ([place]) 11/24/2012  . Hyperlipidemia   . Hypertension   . Neuromuscular disorder ([place])    peripheral neuropathy    Past Surgical History:  Procedure Laterality Date  . BREAST SURGERY     left breast lump--benign  . CARDIAC CATHETERIZATION  06/03/2005  . CORONARY ARTERY BYPASS GRAFT  2007   x2  . LIPOMA EXCISION     back    There were no vitals filed for this visit.                   [facility] Adult PT Treatment/Exercise - 05/18/17 0001      Exercises   Exercises  Shoulder      Shoulder Exercises: Standing   Protraction  Right;20 reps    Theraband Level (Shoulder Protraction)  Level 1 (Yellow)    External Rotation  Strengthening;Right;20 reps    Theraband Level (Shoulder External Rotation)  Level 1 (Yellow)    Internal Rotation  Strengthening;Right;20 reps    Theraband Level (Shoulder Internal Rotation)  Level 1 (Yellow)    Extension  Strengthening;Right;20 reps    Row  20 reps    Theraband Level (Shoulder Row)  Level 1  (Yellow)      Shoulder Exercises: Pulleys   Flexion  Other (comment) 15min    Other Pulley Exercises  Seated UE ranger into flexion, circles eachway x 6 mins      Shoulder Exercises: ROM/Strengthening   UBE (Upper Arm Bike)  120 RPM x6 min      Manual Therapy   Manual Therapy  Soft tissue mobilization    Soft tissue mobilization  STW to R UT, Pectoralis, deltoids, bicep and triceps to reduce muscle tightness               PT Short Term Goals - 05/03/17 1558      PT SHORT TERM GOAL #1   Title  Patient will be independent with HEP    Time  2    Period  Weeks    Status  New    Target Date  05/17/17      PT SHORT TERM GOAL #2   Title  Patient will decrease pain to less than or equal to 2/10 with ADLs.    Time  2    Period  Weeks    Status  New    Target  Date  05/17/17        PT Long Term Goals - 05/03/17 1559      PT LONG TERM GOAL #1   Title  Patient will improve right shoulder flexion AROM to >170 degrees with no pain to perform ADLs.    Time  6    Period  Weeks    Status  New    Target Date  06/14/17      PT LONG TERM GOAL #2   Title  Patient will improve bilateral shoulder flexion strength to 5/5 to perform functional activities.    Time  6    Period  Weeks    Status  New    Target Date  06/14/17      PT LONG TERM GOAL #3   Title  Patient will self report ability to lift bags of groceries with less than or equal to 2/10 pain.    Time  6    Period  Weeks    Status  New    Target Date  06/14/17            Plan - 2017-06-13 1141    Clinical Impression Statement  Pt arrived doing fairly well today with mainly soreness RT shldr. She was able to complete AROM/ AAROM and Tband light strengthening with mainly fatigue. Give for HEP next visit if Pt does well again.    Clinical Presentation  Stable    Rehab Potential  Good    PT Frequency  2x / week    PT Duration  6 weeks    PT Treatment/Interventions  ADLs/Self Care Home  Management;Cryotherapy;Electrical Stimulation;Iontophoresis 4mg /ml Dexamethasone;Moist Heat;Ultrasound;Therapeutic exercise;Therapeutic activities;Neuromuscular re-education;Manual techniques;Passive range of motion;Dry needling    PT Next Visit Plan  Begin AROM exercises and strengthening exercises per patient tolerance, modalities PRN for pain relief.    PT Home Exercise Plan  Scapular retractions    Consulted and Agree with Plan of Care  Patient       Patient will benefit from skilled therapeutic intervention in order to improve the following deficits and impairments:  Pain, Decreased range of motion, Postural dysfunction, Impaired UE functional use  Visit Diagnosis: Chronic right shoulder pain  Chronic left shoulder pain  Abnormal posture   G-Codes - 06/13/17 1211    Functional Assessment Tool Used (Outpatient Only)  FOTO 5th visit 41%       Problem List Patient Active Problem List   Diagnosis Date Noted  . Chronic pain of both shoulders 04/27/2017  . Vitamin D deficiency 01/28/2016  . Controlled type 2 diabetes mellitus with diabetic neuropathy, without long-term current use of insulin ([place]) 11/12/2015  . Statin intolerance 08/18/2013  . Neuromuscular disorder ([place])   . Hemochromatosis, hereditary ([place]) 11/24/2012  . S/P CABG x 2 08/09/2012  . PVC's (premature ventricular contractions) 08/09/2012  . Gout 06/20/2012  . HTN (hypertension) 06/20/2012  . HLD (hyperlipidemia) 06/20/2012  . CAD (coronary artery disease) 06/20/2012  . Arthritis 06/20/2012    Halton Neas,CHRIS , PTA 13-Jun-2017, 12:13 PM  [facility] [place], [place], [address] Phone: [phone]   Fax:  [phone]  Name: Carmen [name] MRN: [id number] Date of Birth: [birth year]

## 2017-05-18 NOTE — Telephone Encounter (Signed)
Last seen 04/27/17  Dr Livia Snellen

## 2017-05-20 ENCOUNTER — Encounter: Payer: Self-pay | Admitting: Physical Therapy

## 2017-05-20 ENCOUNTER — Ambulatory Visit: Payer: Medicare Other | Admitting: Physical Therapy

## 2017-05-20 ENCOUNTER — Other Ambulatory Visit: Payer: Self-pay | Admitting: Family Medicine

## 2017-05-20 DIAGNOSIS — M25512 Pain in left shoulder: Secondary | ICD-10-CM | POA: Diagnosis not present

## 2017-05-20 DIAGNOSIS — R293 Abnormal posture: Secondary | ICD-10-CM | POA: Diagnosis not present

## 2017-05-20 DIAGNOSIS — G8929 Other chronic pain: Secondary | ICD-10-CM

## 2017-05-20 DIAGNOSIS — M25511 Pain in right shoulder: Secondary | ICD-10-CM | POA: Diagnosis not present

## 2017-05-20 NOTE — Therapy (Signed)
[facility] [place], [place], [address] Phone: [phone]   Fax:  [phone]  Physical Therapy Treatment  Patient Details  Name: Carmen [name] MRN: [id number] Date of Birth: Nov 05, 1940 Referring Provider: Dr. Claretta Fraise   Encounter Date: 05/20/2017  PT End of Session - 05/20/17 1317    Visit Number  6    Number of Visits  12    Date for PT Re-Evaluation  06/14/17    Authorization Type  FOTO every 5th visit    PT Start Time  1302    PT Stop Time  1344    PT Time Calculation (min)  42 min    Activity Tolerance  Patient tolerated treatment well    Behavior During Therapy  [facility] for tasks assessed/performed       Past Medical History:  Diagnosis Date  . Anemia   . Arthritis   . CAD (coronary artery disease)   . Cancer ([place]) 11/2015   melanoma right upper arm  . Diabetes mellitus without complication ([place])   . Diverticulosis   . Gout   . Hemochromatosis, hereditary ([place]) 11/24/2012  . Hyperlipidemia   . Hypertension   . Neuromuscular disorder ([place])    peripheral neuropathy    Past Surgical History:  Procedure Laterality Date  . BREAST SURGERY     left breast lump--benign  . CARDIAC CATHETERIZATION  06/03/2005  . CORONARY ARTERY BYPASS GRAFT  2007   x2  . LIPOMA EXCISION     back    There were no vitals filed for this visit.  Subjective Assessment - 05/20/17 1309    Subjective  no new complaints upon arrival    Pertinent History  HTN, DM,  surgeries to remove melanomas, hx of heart conditions (open heart surgery 2007)    Limitations  House hold activities;Lifting    Patient Stated Goals  decrease pain, get stronger to do things at home    Currently in Pain?  Yes    Pain Score  2     Pain Location  Shoulder    Pain Orientation  Right    Pain Descriptors / Indicators  Discomfort;Tiring    Pain Type  Chronic pain    Pain Onset  More than a month ago    Pain Frequency  Constant    Aggravating  Factors   lifting    Pain Relieving Factors  meds and ice                      [facility] Adult PT Treatment/Exercise - 05/20/17 0001      Shoulder Exercises: Standing   Protraction  Strengthening;Right;20 reps;Theraband    Theraband Level (Shoulder Protraction)  Level 1 (Yellow)    External Rotation  Strengthening;Right;20 reps;Theraband    Theraband Level (Shoulder External Rotation)  Level 1 (Yellow)    Internal Rotation  Strengthening;Right;20 reps;Theraband    Theraband Level (Shoulder Internal Rotation)  Level 1 (Yellow)    Row  Strengthening;Right;Theraband;20 reps    Theraband Level (Shoulder Row)  Level 1 (Yellow)      Shoulder Exercises: Pulleys   Flexion  Other (comment);Limitations    Flexion Limitations  20min    Other Pulley Exercises  Seated UE ranger into flexion, circles eachway x 6 mins      Shoulder Exercises: ROM/Strengthening   UBE (Upper Arm Bike)  120 RPM x6 min      Manual Therapy   Manual Therapy  Soft tissue mobilization  Soft tissue mobilization  STW to R UT, Pectoralis, deltoids, bicep and triceps to reduce muscle tightness               PT Short Term Goals - 05/20/17 1319      PT SHORT TERM GOAL #1   Title  Patient will be independent with HEP    Time  2    Period  Weeks    Status  On-going      PT SHORT TERM GOAL #2   Title  Patient will decrease pain to less than or equal to 2/10 with ADLs.    Time  2    Period  Weeks    Status  On-going        PT Long Term Goals - 05/20/17 1321      PT LONG TERM GOAL #1   Title  Patient will improve right shoulder flexion AROM to >170 degrees with no pain to perform ADLs.    Time  6    Period  Weeks    Status  On-going      PT LONG TERM GOAL #2   Title  Patient will improve bilateral shoulder flexion strength to 5/5 to perform functional activities.    Time  6    Period  Weeks    Status  On-going      PT LONG TERM GOAL #3   Title  Patient will self report ability to lift  bags of groceries with less than or equal to 2/10 pain.    Time  6    Period  Weeks    Status  On-going            Plan - 05/20/17 1352    Clinical Impression Statement  Patient tolerated treatment well today. Patient able to complete all exercises today with no reportred increased discomfort only fatigue. Patient has palpable pain in right bicep, ant and post shoulder. FOTO 41% limitation. Goals progressing yet ongoing.     Rehab Potential  Good    PT Frequency  2x / week    PT Duration  6 weeks    PT Treatment/Interventions  ADLs/Self Care Home Management;Cryotherapy;Electrical Stimulation;Iontophoresis 4mg /ml Dexamethasone;Moist Heat;Ultrasound;Therapeutic exercise;Therapeutic activities;Neuromuscular re-education;Manual techniques;Passive range of motion;Dry needling    PT Next Visit Plan  Begin AROM exercises and strengthening exercises per patient tolerance, modalities PRN for pain relief.    Consulted and Agree with Plan of Care  Patient       Patient will benefit from skilled therapeutic intervention in order to improve the following deficits and impairments:  Pain, Decreased range of motion, Postural dysfunction, Impaired UE functional use  Visit Diagnosis: Chronic right shoulder pain  Chronic left shoulder pain  Abnormal posture     Problem List Patient Active Problem List   Diagnosis Date Noted  . Chronic pain of both shoulders 04/27/2017  . Vitamin D deficiency 01/28/2016  . Controlled type 2 diabetes mellitus with diabetic neuropathy, without long-term current use of insulin ([place]) 11/12/2015  . Statin intolerance 08/18/2013  . Neuromuscular disorder ([place])   . Hemochromatosis, hereditary ([place]) 11/24/2012  . S/P CABG x 2 08/09/2012  . PVC's (premature ventricular contractions) 08/09/2012  . Gout 06/20/2012  . HTN (hypertension) 06/20/2012  . HLD (hyperlipidemia) 06/20/2012  . CAD (coronary artery disease) 06/20/2012  . Arthritis 06/20/2012    Phillips Climes 05/20/2017, 2:21 PM  [facility] [address] [place], [place], [address] Phone: [phone]   Fax:  [phone]  Name: Carmen [name] MRN: [id number] Date of Birth: 11-Oct-1940

## 2017-05-20 NOTE — Therapy (Signed)
[facility] [place], [place], [address] Phone: [phone]   Fax:  [phone]  Physical Therapy Treatment  Patient Details  Name: Carmen [name] MRN: [id number] Date of Birth: 1941/01/13 Referring Provider: Dr. Claretta Fraise   Encounter Date: 05/20/2017  PT End of Session - 05/20/17 1317    Visit Number  6    Number of Visits  12    Date for PT Re-Evaluation  06/14/17    Authorization Type  FOTO every 5th visit    PT Start Time  1302    PT Stop Time  1344    PT Time Calculation (min)  42 min    Activity Tolerance  Patient tolerated treatment well    Behavior During Therapy  [facility] for tasks assessed/performed       Past Medical History:  Diagnosis Date  . Anemia   . Arthritis   . CAD (coronary artery disease)   . Cancer ([place]) 11/2015   melanoma right upper arm  . Diabetes mellitus without complication ([place])   . Diverticulosis   . Gout   . Hemochromatosis, hereditary ([place]) 11/24/2012  . Hyperlipidemia   . Hypertension   . Neuromuscular disorder ([place])    peripheral neuropathy    Past Surgical History:  Procedure Laterality Date  . BREAST SURGERY     left breast lump--benign  . CARDIAC CATHETERIZATION  06/03/2005  . CORONARY ARTERY BYPASS GRAFT  2007   x2  . LIPOMA EXCISION     back    There were no vitals filed for this visit.  Subjective Assessment - 05/20/17 1309    Subjective  no new complaints upon arrival    Pertinent History  HTN, DM,  surgeries to remove melanomas, hx of heart conditions (open heart surgery 2007)    Limitations  House hold activities;Lifting    Patient Stated Goals  decrease pain, get stronger to do things at home    Currently in Pain?  Yes    Pain Score  2     Pain Location  Shoulder    Pain Orientation  Right    Pain Descriptors / Indicators  Discomfort;Tiring    Pain Type  Chronic pain    Pain Onset  More than a month ago    Pain Frequency  Constant    Aggravating  Factors   lifting    Pain Relieving Factors  meds and ice                      [facility] Adult PT Treatment/Exercise - 05/20/17 0001      Shoulder Exercises: Standing   Protraction  Strengthening;Right;20 reps;Theraband    Theraband Level (Shoulder Protraction)  Level 1 (Yellow)    External Rotation  Strengthening;Right;20 reps;Theraband    Theraband Level (Shoulder External Rotation)  Level 1 (Yellow)    Internal Rotation  Strengthening;Right;20 reps;Theraband    Theraband Level (Shoulder Internal Rotation)  Level 1 (Yellow)    Row  Strengthening;Right;Theraband;20 reps    Theraband Level (Shoulder Row)  Level 1 (Yellow)      Shoulder Exercises: Pulleys   Flexion  Other (comment);Limitations    Flexion Limitations  29min    Other Pulley Exercises  Seated UE ranger into flexion, circles eachway x 6 mins      Shoulder Exercises: ROM/Strengthening   UBE (Upper Arm Bike)  120 RPM x6 min      Manual Therapy   Manual Therapy  Soft tissue mobilization  Soft tissue mobilization  STW to R UT, Pectoralis, deltoids, bicep and triceps to reduce muscle tightness               PT Short Term Goals - 05/20/17 1319      PT SHORT TERM GOAL #1   Title  Patient will be independent with HEP    Time  2    Period  Weeks    Status  On-going      PT SHORT TERM GOAL #2   Title  Patient will decrease pain to less than or equal to 2/10 with ADLs.    Time  2    Period  Weeks    Status  On-going        PT Long Term Goals - 05/20/17 1321      PT LONG TERM GOAL #1   Title  Patient will improve right shoulder flexion AROM to >170 degrees with no pain to perform ADLs.    Time  6    Period  Weeks    Status  On-going      PT LONG TERM GOAL #2   Title  Patient will improve bilateral shoulder flexion strength to 5/5 to perform functional activities.    Time  6    Period  Weeks    Status  On-going      PT LONG TERM GOAL #3   Title  Patient will self report ability to lift  bags of groceries with less than or equal to 2/10 pain.    Time  6    Period  Weeks    Status  On-going            Plan - 05/20/17 1352    Clinical Impression Statement  Patient tolerated treatment well today. Patient able to complete all exercises today with no reportred increased discomfort only fatigue. Patient has palpable pain in right bicep, ant and post shoulder. Goals progressing yet ongoing.     Rehab Potential  Good    PT Frequency  2x / week    PT Duration  6 weeks    PT Treatment/Interventions  ADLs/Self Care Home Management;Cryotherapy;Electrical Stimulation;Iontophoresis 4mg /ml Dexamethasone;Moist Heat;Ultrasound;Therapeutic exercise;Therapeutic activities;Neuromuscular re-education;Manual techniques;Passive range of motion;Dry needling    PT Next Visit Plan  Begin AROM exercises and strengthening exercises per patient tolerance, modalities PRN for pain relief.    Consulted and Agree with Plan of Care  Patient       Patient will benefit from skilled therapeutic intervention in order to improve the following deficits and impairments:  Pain, Decreased range of motion, Postural dysfunction, Impaired UE functional use  Visit Diagnosis: Chronic right shoulder pain  Chronic left shoulder pain  Abnormal posture     Problem List Patient Active Problem List   Diagnosis Date Noted  . Chronic pain of both shoulders 04/27/2017  . Vitamin D deficiency 01/28/2016  . Controlled type 2 diabetes mellitus with diabetic neuropathy, without long-term current use of insulin ([place]) 11/12/2015  . Statin intolerance 08/18/2013  . Neuromuscular disorder ([place])   . Hemochromatosis, hereditary ([place]) 11/24/2012  . S/P CABG x 2 08/09/2012  . PVC's (premature ventricular contractions) 08/09/2012  . Gout 06/20/2012  . HTN (hypertension) 06/20/2012  . HLD (hyperlipidemia) 06/20/2012  . CAD (coronary artery disease) 06/20/2012  . Arthritis 06/20/2012    Phillips Climes,  PTA 05/20/2017, 1:58 PM  [facility] [address] [place], [place], [address] Phone: [phone]   Fax:  [phone]  Name: [name] MRN: [id number] Date of Birth: May 01, 1940

## 2017-05-25 ENCOUNTER — Ambulatory Visit: Payer: Medicare Other | Attending: Family Medicine | Admitting: Physical Therapy

## 2017-05-25 DIAGNOSIS — M25512 Pain in left shoulder: Secondary | ICD-10-CM | POA: Insufficient documentation

## 2017-05-25 DIAGNOSIS — M25511 Pain in right shoulder: Secondary | ICD-10-CM | POA: Diagnosis not present

## 2017-05-25 DIAGNOSIS — G8929 Other chronic pain: Secondary | ICD-10-CM | POA: Insufficient documentation

## 2017-05-25 DIAGNOSIS — R293 Abnormal posture: Secondary | ICD-10-CM | POA: Diagnosis not present

## 2017-05-25 NOTE — Therapy (Signed)
[facility] [place], [place], [address] Phone: [phone]   Fax:  [phone]  Physical Therapy Treatment  Patient Details  Name: Carmen [name] MRN: [id number] Date of Birth: [birth year] Referring Provider: Dr. Claretta Fraise   Encounter Date: 05/25/2017  PT End of Session - 05/25/17 1306    Visit Number  7    Number of Visits  12    Date for PT Re-Evaluation  06/14/17    Authorization Type  FOTO every 5th visit    PT Start Time  1300    PT Stop Time  1345    PT Time Calculation (min)  45 min    Activity Tolerance  Patient tolerated treatment well    Behavior During Therapy  [facility] for tasks assessed/performed       Past Medical History:  Diagnosis Date  . Anemia   . Arthritis   . CAD (coronary artery disease)   . Cancer ([place]) 11/2015   melanoma right upper arm  . Diabetes mellitus without complication ([place])   . Diverticulosis   . Gout   . Hemochromatosis, hereditary ([place]) 11/24/2012  . Hyperlipidemia   . Hypertension   . Neuromuscular disorder ([place])    peripheral neuropathy    Past Surgical History:  Procedure Laterality Date  . BREAST SURGERY     left breast lump--benign  . CARDIAC CATHETERIZATION  06/03/2005  . CORONARY ARTERY BYPASS GRAFT  2007   x2  . LIPOMA EXCISION     back    There were no vitals filed for this visit.  Subjective Assessment - 05/25/17 1306    Subjective  Patient says she is doing so much better since doing therapy. She has gone from a 5-6/10 constant pain to just feeling it when she does something strenuous.    Pertinent History  HTN, DM,  surgeries to remove melanomas, hx of heart conditions (open heart surgery 2007)    Limitations  House hold activities;Lifting    Patient Stated Goals  decrease pain, get stronger to do things at home    Currently in Pain?  No/denies         [facility] PT Assessment - 05/25/17 0001      AROM   Overall AROM Comments  R shoulder flex active  140 deg standing; 160 deg supine/ passive 159 in standing; L shoulder active flex 165      PROM   PROM Assessment Site  Shoulder    Right/Left Shoulder  Right;Left      Strength   Strength Assessment Site  Shoulder    Right/Left Shoulder  Right    Right Shoulder Flexion  4+/5    Right Shoulder ABduction  4+/5    Right Shoulder Internal Rotation  5/5    Right Shoulder External Rotation  5/5                  [facility] Adult PT Treatment/Exercise - 05/25/17 0001      Shoulder Exercises: Standing   Protraction  Strengthening;Right;20 reps;Theraband    Theraband Level (Shoulder Protraction)  Level 2 (Red)    External Rotation  Strengthening;Right;20 reps;Theraband    Theraband Level (Shoulder External Rotation)  Level 2 (Red)    Internal Rotation  Strengthening;Right;20 reps;Theraband    Theraband Level (Shoulder Internal Rotation)  Level 2 (Red)    Row  Strengthening;Right;Theraband;20 reps    Theraband Level (Shoulder Row)  Level 2 (Red)      Shoulder Exercises: Pulleys  Flexion  Other (comment);Limitations    Flexion Limitations  5 min      Shoulder Exercises: ROM/Strengthening   UBE (Upper Arm Bike)  120 RPM x6 min    Other ROM/Strengthening Exercises  ladder x 10 with stretch at end range (#25)      Manual Therapy   Manual Therapy  Soft tissue mobilization    Soft tissue mobilization  to R upper arm, latissimus and lateral scapular muscles in supine and SDLY               PT Short Term Goals - 05/25/17 1308      PT SHORT TERM GOAL #1   Title  Patient will be independent with HEP    Time  2    Status  Achieved      PT SHORT TERM GOAL #2   Title  Patient will decrease pain to less than or equal to 2/10 with ADLs.    Time  2    Period  Weeks    Status  Achieved        PT Long Term Goals - 05/25/17 1310      PT LONG TERM GOAL #1   Title  Patient will improve right shoulder flexion AROM to >170 degrees with no pain to perform ADLs.    Baseline   160 deg supine and passively in standing    Period  Weeks    Status  On-going      PT LONG TERM GOAL #2   Title  Patient will improve bilateral shoulder flexion strength to 5/5 to perform functional activities.    Time  6    Period  Weeks    Status  On-going      PT LONG TERM GOAL #3   Title  Patient will self report ability to lift bags of groceries with less than or equal to 2/10 pain.    Baseline  patient does not lift with the R arm if she feels it at all    Time  6    Period  Weeks    Status  On-going            Plan - 05/25/17 1313    Clinical Impression Statement  Patient is progressing well with goals. She has improved flex and ABD strength to 4+/5 and functionally is able to lift more. She does not lift anything when she feels a pull in the shoulder such as grocery bags. Her ROM is also progressing well.    Rehab Potential  Good    PT Frequency  2x / week    PT Duration  6 weeks    PT Treatment/Interventions  ADLs/Self Care Home Management;Cryotherapy;Electrical Stimulation;Iontophoresis 4mg /ml Dexamethasone;Moist Heat;Ultrasound;Therapeutic exercise;Therapeutic activities;Neuromuscular re-education;Manual techniques;Passive range of motion;Dry needling    PT Next Visit Plan  Continue strengthening to allow patient to gain full Flexion ROM.     Consulted and Agree with Plan of Care  Patient       Patient will benefit from skilled therapeutic intervention in order to improve the following deficits and impairments:  Pain, Decreased range of motion, Postural dysfunction, Impaired UE functional use  Visit Diagnosis: Chronic right shoulder pain  Chronic left shoulder pain     Problem List Patient Active Problem List   Diagnosis Date Noted  . Chronic pain of both shoulders 04/27/2017  . Vitamin D deficiency 01/28/2016  . Controlled type 2 diabetes mellitus with diabetic neuropathy, without long-term current use of insulin ([place]) 11/12/2015  .  Statin intolerance  08/18/2013  . Neuromuscular disorder ([place])   . Hemochromatosis, hereditary ([place]) 11/24/2012  . S/P CABG x 2 08/09/2012  . PVC's (premature ventricular contractions) 08/09/2012  . Gout 06/20/2012  . HTN (hypertension) 06/20/2012  . HLD (hyperlipidemia) 06/20/2012  . CAD (coronary artery disease) 06/20/2012  . Arthritis 06/20/2012    [name] PT 05/25/2017, 3:48 PM  [facility] [address] [place], [place], [address] Phone: [phone]   Fax:  [phone]  Name: Carmen [name] MRN: [id number] Date of Birth: [birth year]

## 2017-05-27 ENCOUNTER — Encounter: Payer: Self-pay | Admitting: Physical Therapy

## 2017-05-27 ENCOUNTER — Ambulatory Visit: Payer: Medicare Other | Admitting: Physical Therapy

## 2017-05-27 DIAGNOSIS — R293 Abnormal posture: Secondary | ICD-10-CM

## 2017-05-27 DIAGNOSIS — G8929 Other chronic pain: Secondary | ICD-10-CM | POA: Diagnosis not present

## 2017-05-27 DIAGNOSIS — M25511 Pain in right shoulder: Principal | ICD-10-CM

## 2017-05-27 DIAGNOSIS — M25512 Pain in left shoulder: Secondary | ICD-10-CM

## 2017-05-27 NOTE — Therapy (Signed)
[facility] [place], [place], [address] Phone: [phone]   Fax:  [phone]  Physical Therapy Treatment  Patient Details  Name: Carmen [name] MRN: [id number] Date of Birth: 09-15-1940 Referring Provider: Dr. Claretta Fraise   Encounter Date: 05/27/2017  PT End of Session - 05/27/17 1452    Visit Number  8    Number of Visits  12    Date for PT Re-Evaluation  06/14/17    Authorization Type  FOTO every 5th visit    PT Start Time  1431    PT Stop Time  1513    PT Time Calculation (min)  42 min    Activity Tolerance  Patient tolerated treatment well    Behavior During Therapy  [facility] for tasks assessed/performed       Past Medical History:  Diagnosis Date  . Anemia   . Arthritis   . CAD (coronary artery disease)   . Cancer ([place]) 11/2015   melanoma right upper arm  . Diabetes mellitus without complication ([place])   . Diverticulosis   . Gout   . Hemochromatosis, hereditary ([place]) 11/24/2012  . Hyperlipidemia   . Hypertension   . Neuromuscular disorder ([place])    peripheral neuropathy    Past Surgical History:  Procedure Laterality Date  . BREAST SURGERY     left breast lump--benign  . CARDIAC CATHETERIZATION  06/03/2005  . CORONARY ARTERY BYPASS GRAFT  2007   x2  . LIPOMA EXCISION     back    There were no vitals filed for this visit.  Subjective Assessment - 05/27/17 1436    Subjective  Patient arrived with improvement overall    Pertinent History  HTN, DM,  surgeries to remove melanomas, hx of heart conditions (open heart surgery 2007)    Limitations  House hold activities;Lifting    Patient Stated Goals  decrease pain, get stronger to do things at home    Currently in Pain?  No/denies         [facility] PT Assessment - 05/27/17 0001      AROM   AROM Assessment Site  Shoulder    Right/Left Shoulder  Right    Right Shoulder Flexion  130 Degrees                  [facility] Adult PT  Treatment/Exercise - 05/27/17 0001      Shoulder Exercises: Standing   Protraction  Strengthening;Right;20 reps;Theraband;10 reps    Theraband Level (Shoulder Protraction)  Level 2 (Red)    External Rotation  Strengthening;Right;20 reps;Theraband;10 reps    Theraband Level (Shoulder External Rotation)  Level 2 (Red)    Internal Rotation  Strengthening;Right;20 reps;Theraband;10 reps    Theraband Level (Shoulder Internal Rotation)  Level 2 (Red)    Row  Strengthening;Right;Theraband;20 reps;10 reps    Theraband Level (Shoulder Row)  Level 2 (Red)      Shoulder Exercises: Pulleys   Flexion  Other (comment);Limitations    Flexion Limitations  5 min      Shoulder Exercises: ROM/Strengthening   UBE (Upper Arm Bike)  120 RPM x6 min      Manual Therapy   Manual Therapy  Soft tissue mobilization    Soft tissue mobilization  to R upper arm, latissimus and lateral scapular muscles in supine and SDLY             PT Education - 05/27/17 1508    Education provided  Yes    Education  Details  HEP    Person(s) Educated  Patient    Methods  Explanation;Demonstration;Handout    Comprehension  Verbalized understanding;Returned demonstration       PT Short Term Goals - 05/25/17 1308      PT SHORT TERM GOAL #1   Title  Patient will be independent with HEP    Time  2    Status  Achieved      PT SHORT TERM GOAL #2   Title  Patient will decrease pain to less than or equal to 2/10 with ADLs.    Time  2    Period  Weeks    Status  Achieved        PT Long Term Goals - 05/25/17 1310      PT LONG TERM GOAL #1   Title  Patient will improve right shoulder flexion AROM to >170 degrees with no pain to perform ADLs.    Baseline  160 deg supine and passively in standing    Period  Weeks    Status  On-going      PT LONG TERM GOAL #2   Title  Patient will improve bilateral shoulder flexion strength to 5/5 to perform functional activities.    Time  6    Period  Weeks    Status  On-going       PT LONG TERM GOAL #3   Title  Patient will self report ability to lift bags of groceries with less than or equal to 2/10 pain.    Baseline  patient does not lift with the R arm if she feels it at all    Time  6    Period  Weeks    Status  On-going            Plan - 05/27/17 1508    Clinical Impression Statement  Patient tolerated treatment well today. patient has improved active shoulder flexion today and has no pain with movement. Patient has some palpable soreness ant/inf right shoulder. Patient progresing toward goals.     Rehab Potential  Good    PT Treatment/Interventions  ADLs/Self Care Home Management;Cryotherapy;Electrical Stimulation;Iontophoresis 4mg /ml Dexamethasone;Moist Heat;Ultrasound;Therapeutic exercise;Therapeutic activities;Neuromuscular re-education;Manual techniques;Passive range of motion;Dry needling    PT Next Visit Plan  Continue strengthening to allow patient to gain full Flexion ROM.     Consulted and Agree with Plan of Care  Patient       Patient will benefit from skilled therapeutic intervention in order to improve the following deficits and impairments:  Pain, Decreased range of motion, Postural dysfunction, Impaired UE functional use  Visit Diagnosis: Chronic right shoulder pain  Chronic left shoulder pain  Abnormal posture     Problem List Patient Active Problem List   Diagnosis Date Noted  . Chronic pain of both shoulders 04/27/2017  . Vitamin D deficiency 01/28/2016  . Controlled type 2 diabetes mellitus with diabetic neuropathy, without long-term current use of insulin ([place]) 11/12/2015  . Statin intolerance 08/18/2013  . Neuromuscular disorder ([place])   . Hemochromatosis, hereditary ([place]) 11/24/2012  . S/P CABG x 2 08/09/2012  . PVC's (premature ventricular contractions) 08/09/2012  . Gout 06/20/2012  . HTN (hypertension) 06/20/2012  . HLD (hyperlipidemia) 06/20/2012  . CAD (coronary artery disease) 06/20/2012  . Arthritis  06/20/2012    Phillips Climes, PTA 05/27/2017, 3:15 PM  [facility] [place], [place], [address] Phone: [phone]   Fax:  [phone]  Name: Carmen [name] MRN: [id number]  Date of Birth: 1940/12/29

## 2017-05-27 NOTE — Patient Instructions (Signed)
  Strengthening: Resisted Flexion   Hold tubing with right arm at side. Pull forward and up. Move shoulder through pain-free range of motion. Repeat __10__ times per set. Do _2-3___ sets per session. Do _2-3___ sessions per day.  Strengthening: Resisted Extension   Hold tubing in right hand, arm forward. Pull arm back, elbow straight. Repeat __10__ times per set. Do _2-3___ sets per session. Do _2-3___ sessions per day.   Strengthening: Resisted Internal Rotation   Hold tubing in right hand, elbow at side and forearm out. Rotate forearm in across body. Repeat __10__ times per set. Do _2-3___ sets per session. Do _2-3___ sessions per day.   Strengthening: Resisted External Rotation   Hold tubing in right hand, elbow at side and forearm across body. Rotate forearm out. Repeat __10__ times per set. Do __2-3__ sets per session. Do ____ sessions per day.   

## 2017-06-01 ENCOUNTER — Ambulatory Visit: Payer: Medicare Other | Admitting: *Deleted

## 2017-06-01 ENCOUNTER — Encounter: Payer: Self-pay | Admitting: *Deleted

## 2017-06-01 DIAGNOSIS — M25512 Pain in left shoulder: Secondary | ICD-10-CM

## 2017-06-01 DIAGNOSIS — M25511 Pain in right shoulder: Principal | ICD-10-CM

## 2017-06-01 DIAGNOSIS — R293 Abnormal posture: Secondary | ICD-10-CM

## 2017-06-01 DIAGNOSIS — G8929 Other chronic pain: Secondary | ICD-10-CM

## 2017-06-01 NOTE — Therapy (Signed)
[facility] [place], [place], [address] Phone: [phone]   Fax:  [phone]  Physical Therapy Treatment  Patient Details  Name: Carmen [name] MRN: [id number] Date of Birth: 09-07-1940 Referring Provider: Dr. Claretta Fraise   Encounter Date: 06/01/2017  PT End of Session - 06/01/17 1145    Visit Number  9    Number of Visits  12    Date for PT Re-Evaluation  06/14/17    PT Start Time  1115    PT Stop Time  8756    PT Time Calculation (min)  49 min       Past Medical History:  Diagnosis Date  . Anemia   . Arthritis   . CAD (coronary artery disease)   . Cancer ([place]) 11/2015   melanoma right upper arm  . Diabetes mellitus without complication ([place])   . Diverticulosis   . Gout   . Hemochromatosis, hereditary ([place]) 11/24/2012  . Hyperlipidemia   . Hypertension   . Neuromuscular disorder ([place])    peripheral neuropathy    Past Surgical History:  Procedure Laterality Date  . BREAST SURGERY     left breast lump--benign  . CARDIAC CATHETERIZATION  06/03/2005  . CORONARY ARTERY BYPASS GRAFT  2007   x2  . LIPOMA EXCISION     back    There were no vitals filed for this visit.                   [facility] Adult PT Treatment/Exercise - 06/01/17 0001      Shoulder Exercises: Standing   Protraction  Strengthening;Right;20 reps;Theraband;10 reps    Theraband Level (Shoulder Protraction)  Level 2 (Red)    External Rotation  Strengthening;Right;20 reps;Theraband;10 reps    Theraband Level (Shoulder External Rotation)  Level 2 (Red)    Internal Rotation  Strengthening;Right;20 reps;Theraband;10 reps    Theraband Level (Shoulder Internal Rotation)  Level 2 (Red)    Extension  Strengthening;Right;20 reps    Row  Strengthening;Right;Theraband;20 reps;10 reps red    Theraband Level (Shoulder Row)  Level 2 (Red)      Shoulder Exercises: Pulleys   Flexion  Other (comment);Limitations    Flexion  Limitations  5 min    Other Pulley Exercises  standing UE ranger flexion x 3 mins    Other Pulley Exercises  Seated UE ranger into flexion, circles eachway x 3 mins      Shoulder Exercises: ROM/Strengthening   UBE (Upper Arm Bike)  120 RPM x6 min      Manual Therapy   Manual Therapy  Soft tissue mobilization    Soft tissue mobilization  to R upper arm, latissimus and lateral scapular muscles in supine and SDLY               PT Short Term Goals - 05/25/17 1308      PT SHORT TERM GOAL #1   Title  Patient will be independent with HEP    Time  2    Status  Achieved      PT SHORT TERM GOAL #2   Title  Patient will decrease pain to less than or equal to 2/10 with ADLs.    Time  2    Period  Weeks    Status  Achieved        PT Long Term Goals - 05/25/17 1310      PT LONG TERM GOAL #1   Title  Patient will improve right shoulder  flexion AROM to >170 degrees with no pain to perform ADLs.    Baseline  160 deg supine and passively in standing    Period  Weeks    Status  On-going      PT LONG TERM GOAL #2   Title  Patient will improve bilateral shoulder flexion strength to 5/5 to perform functional activities.    Time  6    Period  Weeks    Status  On-going      PT LONG TERM GOAL #3   Title  Patient will self report ability to lift bags of groceries with less than or equal to 2/10 pain.    Baseline  patient does not lift with the R arm if she feels it at all    Time  6    Period  Weeks    Status  On-going            Plan - 06/01/17 1146    Clinical Impression Statement  Pt arrived today feeling about 50% better overall with decreased pain now and mainly fatigue with RT shldr use. She was able to perform all AROM Exs and tband strengthening. Still with notable soreness posterior aspect  RT shldr. FOTO next visit.    Clinical Presentation  Stable    Clinical Decision Making  Low    Rehab Potential  Good    PT Frequency  2x / week    PT Duration  6 weeks    PT  Treatment/Interventions  ADLs/Self Care Home Management;Cryotherapy;Electrical Stimulation;Iontophoresis 4mg /ml Dexamethasone;Moist Heat;Ultrasound;Therapeutic exercise;Therapeutic activities;Neuromuscular re-education;Manual techniques;Passive range of motion;Dry needling    PT Next Visit Plan  Continue strengthening to allow patient to gain full Flexion ROM.   FOTO next visit    PT Home Exercise Plan  Scapular retractions    Consulted and Agree with Plan of Care  Patient       Patient will benefit from skilled therapeutic intervention in order to improve the following deficits and impairments:  Pain, Decreased range of motion, Postural dysfunction, Impaired UE functional use  Visit Diagnosis: Chronic right shoulder pain  Chronic left shoulder pain  Abnormal posture     Problem List Patient Active Problem List   Diagnosis Date Noted  . Chronic pain of both shoulders 04/27/2017  . Vitamin D deficiency 01/28/2016  . Controlled type 2 diabetes mellitus with diabetic neuropathy, without long-term current use of insulin ([place]) 11/12/2015  . Statin intolerance 08/18/2013  . Neuromuscular disorder ([place])   . Hemochromatosis, hereditary ([place]) 11/24/2012  . S/P CABG x 2 08/09/2012  . PVC's (premature ventricular contractions) 08/09/2012  . Gout 06/20/2012  . HTN (hypertension) 06/20/2012  . HLD (hyperlipidemia) 06/20/2012  . CAD (coronary artery disease) 06/20/2012  . Arthritis 06/20/2012    Mariely Mahr,CHRIS, PTA 06/01/2017, 12:10 PM  [facility] [place], [place], [address] Phone: [phone]   Fax:  [phone]  Name: TOLULOPE PINKETT MRN: [id number] Date of Birth: 01-14-1941

## 2017-06-03 ENCOUNTER — Ambulatory Visit: Payer: Medicare Other | Admitting: Physical Therapy

## 2017-06-03 ENCOUNTER — Encounter: Payer: Self-pay | Admitting: Physical Therapy

## 2017-06-03 DIAGNOSIS — G8929 Other chronic pain: Secondary | ICD-10-CM | POA: Diagnosis not present

## 2017-06-03 DIAGNOSIS — M25512 Pain in left shoulder: Secondary | ICD-10-CM

## 2017-06-03 DIAGNOSIS — M25511 Pain in right shoulder: Secondary | ICD-10-CM | POA: Diagnosis not present

## 2017-06-03 DIAGNOSIS — R293 Abnormal posture: Secondary | ICD-10-CM

## 2017-06-03 NOTE — Therapy (Signed)
[facility] [place], [place], [address] Phone: [phone]   Fax:  [phone]  Physical Therapy Treatment  Patient Details  Name: Carmen [name] MRN: [id number] Date of Birth: 04-27-1940 Referring Provider: Dr. Claretta Fraise   Encounter Date: 06/03/2017  PT End of Session - 06/03/17 1444    Visit Number  10    Number of Visits  12    Date for PT Re-Evaluation  06/14/17    Authorization Type  FOTO every 5th visit    PT Start Time  1433    PT Stop Time  1515    PT Time Calculation (min)  42 min    Activity Tolerance  Patient tolerated treatment well    Behavior During Therapy  [facility] for tasks assessed/performed       Past Medical History:  Diagnosis Date  . Anemia   . Arthritis   . CAD (coronary artery disease)   . Cancer ([place]) 11/2015   melanoma right upper arm  . Diabetes mellitus without complication ([place])   . Diverticulosis   . Gout   . Hemochromatosis, hereditary ([place]) 11/24/2012  . Hyperlipidemia   . Hypertension   . Neuromuscular disorder ([place])    peripheral neuropathy    Past Surgical History:  Procedure Laterality Date  . BREAST SURGERY     left breast lump--benign  . CARDIAC CATHETERIZATION  06/03/2005  . CORONARY ARTERY BYPASS GRAFT  2007   x2  . LIPOMA EXCISION     back    There were no vitals filed for this visit.  Subjective Assessment - 06/03/17 1443    Subjective  Reports that her shoulder is better but she fatigues throughout RUE. Reports that following previous treatment her arm was very tired. Reports that she has been cleaning her home and doing laundry today.    Pertinent History  HTN, DM,  surgeries to remove melanomas, hx of heart conditions (open heart surgery 2007)    Limitations  House hold activities;Lifting    Patient Stated Goals  decrease pain, get stronger to do things at home    Currently in Pain?  No/denies         [facility] PT Assessment - 06/03/17 0001      Assessment   Onset Date/Surgical Date  11/04/16    Next MD Visit  March 2019    Prior Therapy  N/A                  [facility] Adult PT Treatment/Exercise - 06/03/17 0001      Shoulder Exercises: Standing   Protraction  Strengthening;Right;20 reps;Theraband;10 reps    Theraband Level (Shoulder Protraction)  Level 2 (Red)    External Rotation  Strengthening;Right;20 reps;Theraband;10 reps    Theraband Level (Shoulder External Rotation)  Level 2 (Red)    Internal Rotation  Strengthening;Right;20 reps;Theraband;10 reps    Theraband Level (Shoulder Internal Rotation)  Level 2 (Red)    Extension  Strengthening;Right;20 reps;Theraband    Theraband Level (Shoulder Extension)  Level 2 (Red)    Row  Strengthening;Right;20 reps;Theraband    Theraband Level (Shoulder Row)  Level 2 (Red)    Other Standing Exercises  R wall slides x10 reps    Other Standing Exercises  R shoulder circles CW and CCW x10 reps      Shoulder Exercises: Pulleys   Flexion  Other (comment) x6 min      Shoulder Exercises: ROM/Strengthening   UBE (Upper Arm Bike)  90 RPM  x4 min (2 min forward, 2 min backward)      Manual Therapy   Manual Therapy  Soft tissue mobilization    Soft tissue mobilization  STW/MFR to R deltoids, posterior cuff, bicep region to reduce muscle tightness               PT Short Term Goals - 05/25/17 1308      PT SHORT TERM GOAL #1   Title  Patient will be independent with HEP    Time  2    Status  Achieved      PT SHORT TERM GOAL #2   Title  Patient will decrease pain to less than or equal to 2/10 with ADLs.    Time  2    Period  Weeks    Status  Achieved        PT Long Term Goals - 05/25/17 1310      PT LONG TERM GOAL #1   Title  Patient will improve right shoulder flexion AROM to >170 degrees with no pain to perform ADLs.    Baseline  160 deg supine and passively in standing    Period  Weeks    Status  On-going      PT LONG TERM GOAL #2   Title  Patient will  improve bilateral shoulder flexion strength to 5/5 to perform functional activities.    Time  6    Period  Weeks    Status  On-going      PT LONG TERM GOAL #3   Title  Patient will self report ability to lift bags of groceries with less than or equal to 2/10 pain.    Baseline  patient does not lift with the R arm if she feels it at all    Time  6    Period  Weeks    Status  On-going            Plan - 06/03/17 1525    Clinical Impression Statement  Patient tolerated today's treatment well with only complaints of RUE fatigue. Patient guided through more active antigravity exercises today with only complaint of fatigue. Good technique observed with all exercises following initial demo and VCs. Minimal muscle tightness palpated in R deltoids, bicep and posterior cuff today with patient reporting soreness to manual therapy in R deltoids region.     Rehab Potential  Good    PT Frequency  2x / week    PT Duration  6 weeks    PT Treatment/Interventions  ADLs/Self Care Home Management;Cryotherapy;Electrical Stimulation;Iontophoresis 4mg /ml Dexamethasone;Moist Heat;Ultrasound;Therapeutic exercise;Therapeutic activities;Neuromuscular re-education;Manual techniques;Passive range of motion;Dry needling    PT Next Visit Plan  Continue strengthening to allow patient to gain full Flexion ROM.   FOTO next visit    PT Home Exercise Plan  Scapular retractions    Consulted and Agree with Plan of Care  Patient       Patient will benefit from skilled therapeutic intervention in order to improve the following deficits and impairments:  Pain, Decreased range of motion, Postural dysfunction, Impaired UE functional use  Visit Diagnosis: Chronic right shoulder pain  Chronic left shoulder pain  Abnormal posture     Problem List Patient Active Problem List   Diagnosis Date Noted  . Chronic pain of both shoulders 04/27/2017  . Vitamin D deficiency 01/28/2016  . Controlled type 2 diabetes mellitus  with diabetic neuropathy, without long-term current use of insulin ([place]) 11/12/2015  . Statin intolerance 08/18/2013  . Neuromuscular  disorder ([place])   . Hemochromatosis, hereditary ([place]) 11/24/2012  . S/P CABG x 2 08/09/2012  . PVC's (premature ventricular contractions) 08/09/2012  . Gout 06/20/2012  . HTN (hypertension) 06/20/2012  . HLD (hyperlipidemia) 06/20/2012  . CAD (coronary artery disease) 06/20/2012  . Arthritis 06/20/2012    Standley Brooking, PTA 06/03/2017, 3:32 PM  [facility] [address] [place], [place], [address] Phone: [phone]   Fax:  [phone]  Name: Carmen [name] MRN: [id number] Date of Birth: [birth year]

## 2017-06-08 ENCOUNTER — Ambulatory Visit: Payer: Medicare Other | Admitting: Physical Therapy

## 2017-06-08 ENCOUNTER — Ambulatory Visit: Payer: Medicare Other | Admitting: Family Medicine

## 2017-06-08 DIAGNOSIS — G8929 Other chronic pain: Secondary | ICD-10-CM

## 2017-06-08 DIAGNOSIS — M25511 Pain in right shoulder: Secondary | ICD-10-CM | POA: Diagnosis not present

## 2017-06-08 DIAGNOSIS — R293 Abnormal posture: Secondary | ICD-10-CM | POA: Diagnosis not present

## 2017-06-08 DIAGNOSIS — M25512 Pain in left shoulder: Secondary | ICD-10-CM | POA: Diagnosis not present

## 2017-06-08 NOTE — Therapy (Signed)
[facility] [place], [place], [address] Phone: [phone]   Fax:  [phone]  Physical Therapy Treatment  Patient Details  Name: Carmen [name] MRN: [id number] Date of Birth: 1940-08-02 Referring Provider: Dr. Claretta Fraise   Encounter Date: 06/08/2017  PT End of Session - 06/08/17 1436    Visit Number  11    Number of Visits  12    Date for PT Re-Evaluation  06/14/17    Authorization Type  FOTO every 5th visit    PT Start Time  1433    PT Stop Time  1517    PT Time Calculation (min)  44 min    Activity Tolerance  Patient tolerated treatment well    Behavior During Therapy  [facility] for tasks assessed/performed       Past Medical History:  Diagnosis Date  . Anemia   . Arthritis   . CAD (coronary artery disease)   . Cancer ([place]) 11/2015   melanoma right upper arm  . Diabetes mellitus without complication ([place])   . Diverticulosis   . Gout   . Hemochromatosis, hereditary ([place]) 11/24/2012  . Hyperlipidemia   . Hypertension   . Neuromuscular disorder ([place])    peripheral neuropathy    Past Surgical History:  Procedure Laterality Date  . BREAST SURGERY     left breast lump--benign  . CARDIAC CATHETERIZATION  06/03/2005  . CORONARY ARTERY BYPASS GRAFT  2007   x2  . LIPOMA EXCISION     back    There were no vitals filed for this visit.  Subjective Assessment - 06/08/17 1523    Subjective  Patient reports she has been doing well but her right arm fatigues very easily. She stated she has been able to clean without any reports of pain.    Pertinent History  HTN, DM,  surgeries to remove melanomas, hx of heart conditions (open heart surgery 2007)    Limitations  House hold activities;Lifting    Patient Stated Goals  decrease pain, get stronger to do things at home    Currently in Pain?  No/denies         [facility] PT Assessment - 06/08/17 0001      Assessment   Onset Date/Surgical Date  11/04/16    Next MD  Visit  06/18/17    Prior Therapy  N/A      PROM   Right Shoulder Flexion  160 Degrees    Right Shoulder ABduction  150 Degrees                  [facility] Adult PT Treatment/Exercise - 06/08/17 0001      Shoulder Exercises: Standing   Protraction  Strengthening;Right;20 reps;Theraband;10 reps    Theraband Level (Shoulder Protraction)  Level 2 (Red)    External Rotation  Strengthening;Right;20 reps;Theraband;10 reps    Theraband Level (Shoulder External Rotation)  Level 2 (Red)    Internal Rotation  Strengthening;Right;20 reps;Theraband;10 reps    Theraband Level (Shoulder Internal Rotation)  Level 2 (Red)    Extension  Strengthening;Right;20 reps;Theraband;10 reps    Theraband Level (Shoulder Extension)  Level 2 (Red)    Row  Strengthening;Right;20 reps;Theraband    Theraband Level (Shoulder Row)  Level 2 (Red)      Shoulder Exercises: Pulleys   Flexion  Other (comment)    Flexion Limitations  6 minutes    Other Pulley Exercises  Seated UE ranger into flexion, circles eachway x 3 mins  Shoulder Exercises: ROM/Strengthening   UBE (Upper Arm Bike)  90 RPM x4 min (2 min forward, 2 min backward)      Manual Therapy   Manual Therapy  Soft tissue mobilization;Passive ROM    Soft tissue mobilization  STW/MFR to R deltoids, posterior cuff, bicep region to reduce muscle tightness    Passive ROM  PROM into flexion and abduction to improve ROM               PT Short Term Goals - 05/25/17 1308      PT SHORT TERM GOAL #1   Title  Patient will be independent with HEP    Time  2    Status  Achieved      PT SHORT TERM GOAL #2   Title  Patient will decrease pain to less than or equal to 2/10 with ADLs.    Time  2    Period  Weeks    Status  Achieved        PT Long Term Goals - 05/25/17 1310      PT LONG TERM GOAL #1   Title  Patient will improve right shoulder flexion AROM to >170 degrees with no pain to perform ADLs.    Baseline  160 deg supine and passively  in standing    Period  Weeks    Status  On-going      PT LONG TERM GOAL #2   Title  Patient will improve bilateral shoulder flexion strength to 5/5 to perform functional activities.    Time  6    Period  Weeks    Status  On-going      PT LONG TERM GOAL #3   Title  Patient will self report ability to lift bags of groceries with less than or equal to 2/10 pain.    Baseline  patient does not lift with the R arm if she feels it at all    Time  6    Period  Weeks    Status  On-going            Plan - 06/08/17 1900    Clinical Impression Statement  Patient able to complete exercises despite reports of fatigue. Patinet stated she felt a "pull" in her bicep while she performed standing shoulder extension exercises but dissipated after rest. Patient was able to complete exercises with no reports of "pulling". Patient overall has made significant improvements in regards to pain.    Clinical Presentation  Stable    Clinical Decision Making  Low    Rehab Potential  Good    PT Frequency  2x / week    PT Duration  6 weeks    PT Treatment/Interventions  ADLs/Self Care Home Management;Cryotherapy;Electrical Stimulation;Iontophoresis 4mg /ml Dexamethasone;Moist Heat;Ultrasound;Therapeutic exercise;Therapeutic activities;Neuromuscular re-education;Manual techniques;Passive range of motion;Dry needling    PT Next Visit Plan  Continue strengthening to allow patient to gain full Flexion ROM.   FOTO next visit    Consulted and Agree with Plan of Care  Patient       Patient will benefit from skilled therapeutic intervention in order to improve the following deficits and impairments:  Pain, Decreased range of motion, Postural dysfunction, Impaired UE functional use  Visit Diagnosis: Chronic right shoulder pain  Chronic left shoulder pain  Abnormal posture     Problem List Patient Active Problem List   Diagnosis Date Noted  . Chronic pain of both shoulders 04/27/2017  . Vitamin D  deficiency 01/28/2016  . Controlled  type 2 diabetes mellitus with diabetic neuropathy, without long-term current use of insulin ([place]) 11/12/2015  . Statin intolerance 08/18/2013  . Neuromuscular disorder ([place])   . Hemochromatosis, hereditary ([place]) 11/24/2012  . S/P CABG x 2 08/09/2012  . PVC's (premature ventricular contractions) 08/09/2012  . Gout 06/20/2012  . HTN (hypertension) 06/20/2012  . HLD (hyperlipidemia) 06/20/2012  . CAD (coronary artery disease) 06/20/2012  . Arthritis 06/20/2012   Gabriela Eves, PT, DPT 06/08/2017, 7:05 PM  [facility] [address] [place], [place], [address] Phone: [phone]   Fax:  [phone]  Name: Carmen [name] MRN: [id number] Date of Birth: 1940-10-18

## 2017-06-10 ENCOUNTER — Ambulatory Visit: Payer: Medicare Other | Admitting: Physical Therapy

## 2017-06-10 DIAGNOSIS — M25512 Pain in left shoulder: Secondary | ICD-10-CM

## 2017-06-10 DIAGNOSIS — M25511 Pain in right shoulder: Principal | ICD-10-CM

## 2017-06-10 DIAGNOSIS — R293 Abnormal posture: Secondary | ICD-10-CM | POA: Diagnosis not present

## 2017-06-10 DIAGNOSIS — G8929 Other chronic pain: Secondary | ICD-10-CM | POA: Diagnosis not present

## 2017-06-10 NOTE — Therapy (Signed)
[facility] [place], [place], [address] Phone: [phone]   Fax:  [phone]  Physical Therapy Treatment  Patient Details  Name: Carmen [name] MRN: [id number] Date of Birth: 18-Apr-1940 Referring Provider: Dr. Claretta Fraise   Encounter Date: 06/10/2017  PT End of Session - 06/10/17 1436    Visit Number  12    Number of Visits  16    Date for PT Re-Evaluation  07/08/17    Authorization Type  FOTO every 5th visit    PT Start Time  1431    PT Stop Time  1516    PT Time Calculation (min)  45 min    Activity Tolerance  Patient tolerated treatment well    Behavior During Therapy  [facility] for tasks assessed/performed       Past Medical History:  Diagnosis Date  . Anemia   . Arthritis   . CAD (coronary artery disease)   . Cancer ([place]) 11/2015   melanoma right upper arm  . Diabetes mellitus without complication ([place])   . Diverticulosis   . Gout   . Hemochromatosis, hereditary ([place]) 11/24/2012  . Hyperlipidemia   . Hypertension   . Neuromuscular disorder ([place])    peripheral neuropathy    Past Surgical History:  Procedure Laterality Date  . BREAST SURGERY     left breast lump--benign  . CARDIAC CATHETERIZATION  06/03/2005  . CORONARY ARTERY BYPASS GRAFT  2007   x2  . LIPOMA EXCISION     back    There were no vitals filed for this visit.  Subjective Assessment - 06/10/17 1524    Subjective  Patient reported feeling very well. She reports she has made significant improvements with her pain but her ROM isn't equal to the other.    Pertinent History  HTN, DM,  surgeries to remove melanomas, hx of heart conditions (open heart surgery 2007)    Limitations  House hold activities;Lifting    Patient Stated Goals  decrease pain, get stronger to do things at home    Currently in Pain?  No/denies         [facility] PT Assessment - 06/10/17 0001      Assessment   Onset Date/Surgical Date  11/04/16    Next MD Visit   06/18/17    Prior Therapy  N/A      Observation/Other Assessments   Focus on Therapeutic Outcomes (FOTO)   41% limited      Strength   Right Shoulder Flexion  4+/5    Right Shoulder ABduction  5/5    Right Shoulder Internal Rotation  5/5    Right Shoulder External Rotation  5/5    Left Shoulder Flexion  4+/5    Left Shoulder ABduction  5/5    Left Shoulder Internal Rotation  5/5    Left Shoulder External Rotation  5/5                  [facility] Adult PT Treatment/Exercise - 06/10/17 0001      Shoulder Exercises: Standing   Protraction  Strengthening;Right;20 reps;Theraband;10 reps    Theraband Level (Shoulder Protraction)  Level 2 (Red)    External Rotation  Strengthening;Right;20 reps;Theraband;10 reps    Theraband Level (Shoulder External Rotation)  Level 2 (Red)    Internal Rotation  Strengthening;Right;20 reps;Theraband;10 reps    Theraband Level (Shoulder Internal Rotation)  Level 2 (Red)    Extension  Strengthening;Right;20 reps;Theraband;10 reps    Theraband Level (Shoulder Extension)  Level 2 (Red)    Row  Strengthening;Right;20 reps;Theraband    Theraband Level (Shoulder Row)  Level 2 (Red)      Shoulder Exercises: Pulleys   Flexion  Other (comment) 6 minutes    Flexion Limitations  6 minutes      Shoulder Exercises: ROM/Strengthening   UBE (Upper Arm Bike)  90 RPM x4 min (2 min forward, 2 min backward)               PT Short Term Goals - 05/25/17 1308      PT SHORT TERM GOAL #1   Title  Patient will be independent with HEP    Time  2    Status  Achieved      PT SHORT TERM GOAL #2   Title  Patient will decrease pain to less than or equal to 2/10 with ADLs.    Time  2    Period  Weeks    Status  Achieved        PT Long Term Goals - 06/10/17 1457      PT LONG TERM GOAL #1   Title  Patient will improve right shoulder flexion AROM to >170 degrees with no pain to perform ADLs.    Baseline  160 deg supine and passively in standing    Time   6    Status  On-going      PT LONG TERM GOAL #2   Title  Patient will improve bilateral shoulder flexion strength to 5/5 to perform functional activities.    Time  6    Period  Weeks    Status  On-going      PT LONG TERM GOAL #3   Title  Patient will self report ability to lift bags of groceries with less than or equal to 2/10 pain.    Baseline  Patient is able to lift groceries if bag is light with no increase of pain.    Time  6    Period  Weeks    Status  Achieved            Plan - 06/10/17 1529    Clinical Impression Statement  Patient reported she was able to complete exercises without any muscle fatigue. Patients goals were assessed, some goals are ongoing. PT and patient discussed continuing for 4 more visits to improve ROM and endurance. Patient in agreement. PT instructed patient to not be afraid of using the right arm more often to improve muscle strength, ROM, and endurance. Patient reported agreement. FOTO limitation 41%.    Clinical Presentation  Stable    Clinical Decision Making  Low    Rehab Potential  Good    PT Frequency  2x / week    PT Duration  6 weeks    PT Treatment/Interventions  ADLs/Self Care Home Management;Cryotherapy;Electrical Stimulation;Iontophoresis 4mg /ml Dexamethasone;Moist Heat;Ultrasound;Therapeutic exercise;Therapeutic activities;Neuromuscular re-education;Manual techniques;Passive range of motion;Dry needling    PT Next Visit Plan  Continue strengthening to improve muscular endurance and improve flexion.    PT Home Exercise Plan  horizontal abduction, wall washes in flexion and wall circles.    Consulted and Agree with Plan of Care  Patient       Patient will benefit from skilled therapeutic intervention in order to improve the following deficits and impairments:  Pain, Decreased range of motion, Postural dysfunction, Impaired UE functional use  Visit Diagnosis: Chronic right shoulder pain  Chronic left shoulder pain  Abnormal  posture     Problem  List Patient Active Problem List   Diagnosis Date Noted  . Chronic pain of both shoulders 04/27/2017  . Vitamin D deficiency 01/28/2016  . Controlled type 2 diabetes mellitus with diabetic neuropathy, without long-term current use of insulin ([place]) 11/12/2015  . Statin intolerance 08/18/2013  . Neuromuscular disorder ([place])   . Hemochromatosis, hereditary ([place]) 11/24/2012  . S/P CABG x 2 08/09/2012  . PVC's (premature ventricular contractions) 08/09/2012  . Gout 06/20/2012  . HTN (hypertension) 06/20/2012  . HLD (hyperlipidemia) 06/20/2012  . CAD (coronary artery disease) 06/20/2012  . Arthritis 06/20/2012   Gabriela Eves, PT, DPT 06/10/2017, 6:09 PM  [facility] [address] [place], [place], [address] Phone: [phone]   Fax:  [phone]  Name: FELICIDAD SUGARMAN MRN: [id number] Date of Birth: [birth year]

## 2017-06-15 ENCOUNTER — Ambulatory Visit: Payer: Medicare Other | Admitting: Physical Therapy

## 2017-06-15 DIAGNOSIS — G8929 Other chronic pain: Secondary | ICD-10-CM

## 2017-06-15 DIAGNOSIS — R293 Abnormal posture: Secondary | ICD-10-CM | POA: Diagnosis not present

## 2017-06-15 DIAGNOSIS — M25511 Pain in right shoulder: Secondary | ICD-10-CM | POA: Diagnosis not present

## 2017-06-15 DIAGNOSIS — M25512 Pain in left shoulder: Secondary | ICD-10-CM

## 2017-06-15 NOTE — Therapy (Signed)
[facility] [place], [place], [address] Phone: [phone]   Fax:  [phone]  Physical Therapy Treatment  Patient Details  Name: Carmen [name] MRN: [id number] Date of Birth: 1941/01/21 Referring Provider: Dr. Claretta Fraise   Encounter Date: 06/15/2017  PT End of Session - 06/15/17 1304    Visit Number  13    Number of Visits  16    Date for PT Re-Evaluation  07/08/17    Authorization Type  FOTO every 5th visit    PT Start Time  1300    PT Stop Time  1346    PT Time Calculation (min)  46 min    Activity Tolerance  Patient tolerated treatment well    Behavior During Therapy  [facility] for tasks assessed/performed       Past Medical History:  Diagnosis Date  . Anemia   . Arthritis   . CAD (coronary artery disease)   . Cancer ([place]) 11/2015   melanoma right upper arm  . Diabetes mellitus without complication ([place])   . Diverticulosis   . Gout   . Hemochromatosis, hereditary ([place]) 11/24/2012  . Hyperlipidemia   . Hypertension   . Neuromuscular disorder ([place])    peripheral neuropathy    Past Surgical History:  Procedure Laterality Date  . BREAST SURGERY     left breast lump--benign  . CARDIAC CATHETERIZATION  06/03/2005  . CORONARY ARTERY BYPASS GRAFT  2007   x2  . LIPOMA EXCISION     back    There were no vitals filed for this visit.  Subjective Assessment - 06/15/17 1304    Subjective  Patient states she vacuumed on Saturday and was sore afterwards, but no pain today.    Patient Stated Goals  decrease pain, get stronger to do things at home    Currently in Pain?  No/denies                No data recorded       [facility] Adult PT Treatment/Exercise - 06/15/17 0001      Shoulder Exercises: Standing   Protraction  Strengthening;Right;20 reps;Theraband;10 reps    Theraband Level (Shoulder Protraction)  Level 2 (Red)    External Rotation  Strengthening;Right;20 reps;Theraband;10 reps     Theraband Level (Shoulder External Rotation)  Level 2 (Red)    Internal Rotation  Strengthening;Right;20 reps;Theraband;10 reps    Theraband Level (Shoulder Internal Rotation)  Level 2 (Red)    ABduction  Strengthening;Both;20 reps no wt    Extension  Strengthening;Right;20 reps;Theraband;10 reps    Theraband Level (Shoulder Extension)  Level 2 (Red)    Row  Strengthening;Right;20 reps;Theraband    Theraband Level (Shoulder Row)  Level 2 (Red)    Retraction  Strengthening;Both;10 reps;Theraband horizontal ABD    Theraband Level (Shoulder Retraction)  Level 2 (Red)    Other Standing Exercises  flex/scap x 10 ea 1#     Other Standing Exercises  X to Y x 20 no weight overhead lift      Shoulder Exercises: Pulleys   Flexion  Other (comment) 6 minutes    Flexion Limitations  6 minutes      Shoulder Exercises: ROM/Strengthening   UBE (Upper Arm Bike)  90 RPM x6 min (3 min forward, 3 min backward)             PT Education - 06/15/17 1358    Education provided  Yes    Education Details  HEP    Person(s)  Educated  Patient    Methods  Explanation;Demonstration;Verbal cues;Handout    Comprehension  Verbalized understanding;Returned demonstration       PT Short Term Goals - 05/25/17 1308      PT SHORT TERM GOAL #1   Title  Patient will be independent with HEP    Time  2    Status  Achieved      PT SHORT TERM GOAL #2   Title  Patient will decrease pain to less than or equal to 2/10 with ADLs.    Time  2    Period  Weeks    Status  Achieved        PT Long Term Goals - 06/10/17 1457      PT LONG TERM GOAL #1   Title  Patient will improve right shoulder flexion AROM to >170 degrees with no pain to perform ADLs.    Baseline  160 deg supine and passively in standing    Time  6    Status  On-going      PT LONG TERM GOAL #2   Title  Patient will improve bilateral shoulder flexion strength to 5/5 to perform functional activities.    Time  6    Period  Weeks    Status   On-going      PT LONG TERM GOAL #3   Title  Patient will self report ability to lift bags of groceries with less than or equal to 2/10 pain.    Baseline  Patient is able to lift groceries if bag is light with no increase of pain.    Time  6    Period  Weeks    Status  Achieved            Plan - 06/15/17 1357    Clinical Impression Statement  Patient did very well with TE today. She still requires VCs to avoid body compensation with theraband exercises.    PT Treatment/Interventions  ADLs/Self Care Home Management;Cryotherapy;Electrical Stimulation;Iontophoresis 4mg /ml Dexamethasone;Moist Heat;Ultrasound;Therapeutic exercise;Therapeutic activities;Neuromuscular re-education;Manual techniques;Passive range of motion;Dry needling    PT Next Visit Plan  Continue strengthening to improve muscular endurance and improve flexion.    PT Home Exercise Plan  flex/scap/ABD; X to Y overhead lift in standing. horizontal abduction, wall washes in flexion and wall circles.       Patient will benefit from skilled therapeutic intervention in order to improve the following deficits and impairments:  Pain, Decreased range of motion, Postural dysfunction, Impaired UE functional use  Visit Diagnosis: Chronic right shoulder pain  Chronic left shoulder pain     Problem List Patient Active Problem List   Diagnosis Date Noted  . Chronic pain of both shoulders 04/27/2017  . Vitamin D deficiency 01/28/2016  . Controlled type 2 diabetes mellitus with diabetic neuropathy, without long-term current use of insulin ([place]) 11/12/2015  . Statin intolerance 08/18/2013  . Neuromuscular disorder ([place])   . Hemochromatosis, hereditary ([place]) 11/24/2012  . S/P CABG x 2 08/09/2012  . PVC's (premature ventricular contractions) 08/09/2012  . Gout 06/20/2012  . HTN (hypertension) 06/20/2012  . HLD (hyperlipidemia) 06/20/2012  . CAD (coronary artery disease) 06/20/2012  . Arthritis 06/20/2012    Carmen [name]  PT 06/15/2017, 2:00 PM  [facility] [address] [place], [place], [address] Phone: [phone]   Fax:  [phone]  Name: Carmen [name] MRN: [id number] Date of Birth: June 15, 1940

## 2017-06-15 NOTE — Patient Instructions (Addendum)
   [name], PT 06/15/17 1:58 PM; [facility] [place], [place], [address] Phone: [phone]   Fax:  [phone]

## 2017-06-17 ENCOUNTER — Ambulatory Visit: Payer: Medicare Other | Admitting: *Deleted

## 2017-06-17 DIAGNOSIS — G8929 Other chronic pain: Secondary | ICD-10-CM | POA: Diagnosis not present

## 2017-06-17 DIAGNOSIS — M25512 Pain in left shoulder: Secondary | ICD-10-CM | POA: Diagnosis not present

## 2017-06-17 DIAGNOSIS — R293 Abnormal posture: Secondary | ICD-10-CM

## 2017-06-17 DIAGNOSIS — M25511 Pain in right shoulder: Secondary | ICD-10-CM | POA: Diagnosis not present

## 2017-06-17 NOTE — Therapy (Signed)
[facility] [place], [place], [address] Phone: [phone]   Fax:  [phone]  Physical Therapy Treatment  Patient Details  Name: Carmen [name] MRN: [id number] Date of Birth: 1940/06/10 Referring Provider: Dr. Claretta Fraise   Encounter Date: 06/17/2017  PT End of Session - 06/17/17 1451    Visit Number  14    Number of Visits  16    Date for PT Re-Evaluation  07/08/17    Authorization Type  FOTO every 5th visit taken 06-10-17 41%    PT Start Time  1430    PT Stop Time  1520    PT Time Calculation (min)  50 min       Past Medical History:  Diagnosis Date  . Anemia   . Arthritis   . CAD (coronary artery disease)   . Cancer ([place]) 11/2015   melanoma right upper arm  . Diabetes mellitus without complication ([place])   . Diverticulosis   . Gout   . Hemochromatosis, hereditary ([place]) 11/24/2012  . Hyperlipidemia   . Hypertension   . Neuromuscular disorder ([place])    peripheral neuropathy    Past Surgical History:  Procedure Laterality Date  . BREAST SURGERY     left breast lump--benign  . CARDIAC CATHETERIZATION  06/03/2005  . CORONARY ARTERY BYPASS GRAFT  2007   x2  . LIPOMA EXCISION     back    There were no vitals filed for this visit.  Subjective Assessment - 06/17/17 1530    Subjective  Doing better overall with mainly fatigue    Pertinent History  HTN, DM,  surgeries to remove melanomas, hx of heart conditions (open heart surgery 2007)    Limitations  House hold activities;Lifting    Patient Stated Goals  decrease pain, get stronger to do things at home    Currently in Pain?  No/denies                No data recorded       [facility] Adult PT Treatment/Exercise - 06/17/17 0001      Shoulder Exercises: Standing   External Rotation  Strengthening;Right;20 reps;Theraband;10 reps    Theraband Level (Shoulder External Rotation)  Level 2 (Red)    Internal Rotation  Strengthening;Right;20  reps;Theraband;10 reps    Theraband Level (Shoulder Internal Rotation)  Level 2 (Red)    Extension  Strengthening;Right;20 reps;Theraband XTS green    Row  Strengthening;20 reps;Theraband;Both XTS green      Shoulder Exercises: Pulleys   Flexion  Other (comment);5 minutes    Other Pulley Exercises  Seated UE ranger into flexion, circles eachway x 3 mins, Standing flexion and circles x 3 mins      Shoulder Exercises: ROM/Strengthening   UBE (Upper Arm Bike)  90 RPM x6 min (3 min forward, 3 min backward)               PT Short Term Goals - 05/25/17 1308      PT SHORT TERM GOAL #1   Title  Patient will be independent with HEP    Time  2    Status  Achieved      PT SHORT TERM GOAL #2   Title  Patient will decrease pain to less than or equal to 2/10 with ADLs.    Time  2    Period  Weeks    Status  Achieved        PT Long Term Goals - 06/10/17 1457  PT LONG TERM GOAL #1   Title  Patient will improve right shoulder flexion AROM to >170 degrees with no pain to perform ADLs.    Baseline  160 deg supine and passively in standing    Time  6    Status  On-going      PT LONG TERM GOAL #2   Title  Patient will improve bilateral shoulder flexion strength to 5/5 to perform functional activities.    Time  6    Period  Weeks    Status  On-going      PT LONG TERM GOAL #3   Title  Patient will self report ability to lift bags of groceries with less than or equal to 2/10 pain.    Baseline  Patient is able to lift groceries if bag is light with no increase of pain.    Time  6    Period  Weeks    Status  Achieved            Plan - 06/17/17 1533    Clinical Impression Statement  Pt arrived today doing fairly well with mainly complaints of fatige while performing ADL's. She did well with Therex today with no C/O pain. She experienced mainly fatigue during exs especially flexion motion. LTG for AROM NM due toROM deficit.    Clinical Presentation  Stable    Rehab  Potential  Good    PT Frequency  2x / week    PT Duration  6 weeks    PT Treatment/Interventions  ADLs/Self Care Home Management;Cryotherapy;Electrical Stimulation;Iontophoresis 4mg /ml Dexamethasone;Moist Heat;Ultrasound;Therapeutic exercise;Therapeutic activities;Neuromuscular re-education;Manual techniques;Passive range of motion;Dry needling    PT Next Visit Plan  Continue strengthening to improve muscular endurance and improve flexion.    PT Home Exercise Plan  flex/scap/ABD; X to Y overhead lift in standing. horizontal abduction, wall washes in flexion and wall circles.    Consulted and Agree with Plan of Care  Patient       Patient will benefit from skilled therapeutic intervention in order to improve the following deficits and impairments:  Pain, Decreased range of motion, Postural dysfunction, Impaired UE functional use  Visit Diagnosis: Chronic right shoulder pain  Chronic left shoulder pain  Abnormal posture     Problem List Patient Active Problem List   Diagnosis Date Noted  . Chronic pain of both shoulders 04/27/2017  . Vitamin D deficiency 01/28/2016  . Controlled type 2 diabetes mellitus with diabetic neuropathy, without long-term current use of insulin ([place]) 11/12/2015  . Statin intolerance 08/18/2013  . Neuromuscular disorder ([place])   . Hemochromatosis, hereditary ([place]) 11/24/2012  . S/P CABG x 2 08/09/2012  . PVC's (premature ventricular contractions) 08/09/2012  . Gout 06/20/2012  . HTN (hypertension) 06/20/2012  . HLD (hyperlipidemia) 06/20/2012  . CAD (coronary artery disease) 06/20/2012  . Arthritis 06/20/2012    Naftoli Penny,CHRIS, PTA 06/17/2017, 3:38 PM  [facility] [place], [place], [address] Phone: [phone]   Fax:  [phone]  Name: Carmen [name] MRN: [id number] Date of Birth: 04/05/1940

## 2017-06-18 ENCOUNTER — Encounter: Payer: Self-pay | Admitting: Family Medicine

## 2017-06-18 ENCOUNTER — Ambulatory Visit (INDEPENDENT_AMBULATORY_CARE_PROVIDER_SITE_OTHER): Payer: Medicare Other | Admitting: Family Medicine

## 2017-06-18 VITALS — BP 133/70 | HR 68 | Temp 97.3°F | Ht 63.0 in | Wt 188.5 lb

## 2017-06-18 DIAGNOSIS — E1140 Type 2 diabetes mellitus with diabetic neuropathy, unspecified: Secondary | ICD-10-CM | POA: Diagnosis not present

## 2017-06-18 DIAGNOSIS — I10 Essential (primary) hypertension: Secondary | ICD-10-CM | POA: Diagnosis not present

## 2017-06-18 DIAGNOSIS — E782 Mixed hyperlipidemia: Secondary | ICD-10-CM

## 2017-06-18 DIAGNOSIS — M1000 Idiopathic gout, unspecified site: Secondary | ICD-10-CM | POA: Diagnosis not present

## 2017-06-18 DIAGNOSIS — E559 Vitamin D deficiency, unspecified: Secondary | ICD-10-CM | POA: Diagnosis not present

## 2017-06-18 LAB — BAYER DCA HB A1C WAIVED: HB A1C (BAYER DCA - WAIVED): 7.4 % — ABNORMAL HIGH (ref <7.0)

## 2017-06-18 MED ORDER — OLMESARTAN MEDOXOMIL 20 MG PO TABS: 20.0000 mg | ORAL_TABLET | Freq: Every day | ORAL | 1 refills | Status: DC

## 2017-06-18 MED ORDER — COLCHICINE 0.6 MG PO TABS: ORAL_TABLET | ORAL | 1 refills | Status: DC

## 2017-06-18 MED ORDER — SITAGLIPTIN PHOSPHATE 100 MG PO TABS: 100.0000 mg | ORAL_TABLET | Freq: Every day | ORAL | 1 refills | Status: DC

## 2017-06-18 MED ORDER — TRIAMTERENE-HCTZ 37.5-25 MG PO CAPS: ORAL_CAPSULE | ORAL | 1 refills | Status: DC

## 2017-06-18 MED ORDER — GLUCOSE BLOOD VI STRP: ORAL_STRIP | 99 refills | Status: DC

## 2017-06-18 MED ORDER — ALLOPURINOL 100 MG PO TABS: ORAL_TABLET | ORAL | 1 refills | Status: DC

## 2017-06-18 MED ORDER — METOPROLOL TARTRATE 25 MG PO TABS: ORAL_TABLET | ORAL | 1 refills | Status: DC

## 2017-06-18 NOTE — Progress Notes (Signed)
Subjective:  Patient ID: Carmen [name], female    DOB: 1940-08-21  Age: 77 y.o. MRN: [id number]  CC: Follow-up (pt here today for 6 week f/u with her shoulders which she says is much better)   HPI Carmen [name] presents for follow-up of diabetes. Patient does  check blood sugar at home fasting runs between 129 and 139.  postprandial 169-179.  She was recently taking prednisone.  blood sugar went up as high as 200 during that time. Patient denies symptoms such as polyuria, polydipsia, excessive hunger, nausea No significant hypoglycemic spells noted. Medications as noted below. Taking them regularly without complication/adverse reaction being reported today.  Patient has had multiple visits with physical therapy for her shoulder pain.  After treatment it has returned to near normal range of motion and pain has essentially resolved.   Follow-up of hypertension. Patient has no history of headache chest pain or shortness of breath or recent cough. Patient also denies symptoms of TIA such as numbness weakness lateralizing. Patient checks  blood pressure at home and has not had any elevated readings recently. Patient denies side effects from his medication. States taking it regularly.   Depression screen [facility] 2/9 06/18/2017 04/27/2017 03/08/2017  Decreased Interest 0 0 0  Down, Depressed, Hopeless 0 0 0  PHQ - 2 Score 0 0 0  Altered sleeping - - -  Tired, decreased energy - - -  Change in appetite - - -  Feeling bad or failure about yourself  - - -  Trouble concentrating - - -  Moving slowly or fidgety/restless - - -  Suicidal thoughts - - -  PHQ-9 Score - - -  Difficult doing work/chores - - -    History Carmen [name] has a past medical history of Anemia, Arthritis, CAD (coronary artery disease), Cancer ([place]) (11/2015), Diabetes mellitus without complication ([place]), Diverticulosis, Gout, Hemochromatosis, hereditary ([place]) (11/24/2012), Hyperlipidemia, Hypertension, and Neuromuscular  disorder ([place]).   She has a past surgical history that includes Lipoma excision; Breast surgery; Cardiac catheterization (06/03/2005); and Coronary artery bypass graft (2007).   Her family history includes Arthritis in her sister, sister, and sister; Cancer (age of onset: 41) in her sister; Diabetes in her brother, brother, brother, and father; Diverticulitis in her sister; Hemachromatosis in her brother, sister, sister, and sister; Hypertension in her father and mother; Lupus in her sister; Neuropathy in her mother; Stroke in her father and mother.She reports that she has never smoked. She has never used smokeless tobacco. She reports that she does not drink alcohol or use drugs.    ROS Review of Systems  Constitutional: Negative.   HENT: Negative for congestion.   Eyes: Negative for visual disturbance.  Respiratory: Negative for shortness of breath.   Cardiovascular: Negative for chest pain.  Gastrointestinal: Negative for abdominal pain, constipation, diarrhea, nausea and vomiting.  Genitourinary: Negative for difficulty urinating.  Musculoskeletal: Negative for arthralgias and myalgias.  Neurological: Negative for headaches.  Psychiatric/Behavioral: Negative for sleep disturbance.    Objective:  BP 133/70   Pulse 68   Temp (!) 97.3 F (36.3 C) (Oral)   Ht _0  (1.6 m)   Wt 188 lb 8 oz (85.5 kg)   BMI 33.39 kg/m   BP Readings from Last 3 Encounters:  06/18/17 133/70  05/11/17 128/79  05/04/17 (!) 120/59    Wt Readings from Last 3 Encounters:  06/18/17 188 lb 8 oz (85.5 kg)  04/29/17 187 lb (84.8 kg)  04/27/17 187 lb (84.8 kg)  Physical Exam  Constitutional: She is oriented to person, place, and time. She appears well-developed and well-nourished. No distress.  HENT:  Head: Normocephalic and atraumatic.  Right Ear: External ear normal.  Left Ear: External ear normal.  Neck: Normal range of motion. Neck supple.  Cardiovascular: Normal rate, regular rhythm and  normal heart sounds.  No murmur heard. Pulmonary/Chest: Effort normal and breath sounds normal. No respiratory distress. She has no wheezes. She has no rales.  Abdominal: Soft. There is no tenderness.  Musculoskeletal: Normal range of motion. She exhibits no edema or tenderness.  Neurological: She is alert and oriented to person, place, and time. She exhibits normal muscle tone. Coordination normal.  Skin: Skin is warm and dry.  Psychiatric: She has a normal mood and affect. Her behavior is normal.      Assessment & Plan:   Carmen [name] was seen today for follow-up.  Diagnoses and all orders for this visit:  Essential hypertension -     CBC with Differential/Platelet -     CMP14+EGFR  Controlled type 2 diabetes mellitus with diabetic neuropathy, without long-term current use of insulin (HCC) -     Bayer DCA Hb A1c Waived  Vitamin D deficiency -     VITAMIN D 25 Hydroxy (Vit-D Deficiency, Fractures)  Idiopathic gout, unspecified chronicity, unspecified site -     Uric acid  Mixed hyperlipidemia -     Lipid panel  Other orders -     allopurinol (ZYLOPRIM) 100 MG tablet; TAKE (1) TABLET TWICE A DAY. -     colchicine 0.6 MG tablet; TAKE (1) TABLET DAILY AS NEEDED. -     glucose blood (ONE TOUCH ULTRA TEST) test strip; CHECK BLOOD SUGAR 2 TIMES A DAY -     sitaGLIPtin (JANUVIA) 100 MG tablet; Take 1 tablet (100 mg total) by mouth daily. -     metoprolol tartrate (LOPRESSOR) 25 MG tablet; TAKE (1) TABLET TWICE A DAY. -     olmesartan (BENICAR) 20 MG tablet; Take 1 tablet (20 mg total) by mouth daily. -     triamterene-hydrochlorothiazide (DYAZIDE) 37.5-25 MG capsule; TAKE (1) CAPSULE DAILY       I have discontinued Carmen [name]'s predniSONE. I have also changed her JANUVIA to sitaGLIPtin. Additionally, I am having her maintain her aspirin, fluticasone, VOLTAREN, omega-3 acid ethyl esters, ACCU-CHEK FASTCLIX LANCET, pregabalin, PRALUENT, allopurinol, colchicine, glucose  blood, metoprolol tartrate, olmesartan, and triamterene-hydrochlorothiazide.  Allergies as of 06/18/2017      Reactions   Cymbalta [duloxetine Hcl] Other (See Comments)   Profuse sweating   Diclofenac Diarrhea   Crestor [rosuvastatin] Other (See Comments)   Muscle aches on 5 mg daily and 10 mg twice weekly   Lipitor [atorvastatin] Other (See Comments)   Muscle aches   Livalo [pitavastatin] Other (See Comments)      Sulfa Antibiotics Rash   Zetia [ezetimibe] Other (See Comments)   Muscle aches   Zocor [simvastatin] Other (See Comments)   Muscle aches       Medication List        Accurate as of 06/18/17 11:59 PM. Always use your most recent med list.          ACCU-CHEK FASTCLIX LANCET Kit Use to check Blood Sugars   allopurinol 100 MG tablet Commonly known as:  ZYLOPRIM TAKE (1) TABLET TWICE A DAY.   aspirin 81 MG tablet Take 81 mg by mouth daily.   colchicine 0.6 MG tablet TAKE (1) TABLET DAILY AS NEEDED.  fluticasone 50 MCG/ACT nasal spray Commonly known as:  FLONASE SPRAY 1 SPRAY IN EACH NOSTRIL ONCE DAILY.   glucose blood test strip Commonly known as:  ONE TOUCH ULTRA TEST CHECK BLOOD SUGAR 2 TIMES A DAY   metoprolol tartrate 25 MG tablet Commonly known as:  LOPRESSOR TAKE (1) TABLET TWICE A DAY.   olmesartan 20 MG tablet Commonly known as:  BENICAR Take 1 tablet (20 mg total) by mouth daily.   omega-3 acid ethyl esters 1 g capsule Commonly known as:  LOVAZA TAKE (2) CAPSULES TWICE DAILY.   PRALUENT 75 MG/ML Sopn Generic drug:  Alirocumab Inject 75 mg under the skin every 14 (fourteen) days   pregabalin 50 MG capsule Commonly known as:  LYRICA TAKE  (1)  CAPSULE  TWICE DAILY.   sitaGLIPtin 100 MG tablet Commonly known as:  JANUVIA Take 1 tablet (100 mg total) by mouth daily.   triamterene-hydrochlorothiazide 37.5-25 MG capsule Commonly known as:  DYAZIDE TAKE (1) CAPSULE DAILY   Vitamin D (Ergocalciferol) 50000 units Caps capsule Commonly  known as:  DRISDOL Take 1 capsule (50,000 Units total) by mouth every 7 (seven) days.   VOLTAREN 1 % Gel Generic drug:  diclofenac sodium APPLY 4 GRAMS TO AFFECTED AREA(S) 4 TIMES A DAY AS NEEDED        Follow-up: Return in about 3 months (around 09/18/2017).  Claretta Fraise, M.D.

## 2017-06-19 LAB — CBC WITH DIFFERENTIAL/PLATELET
BASOS ABS: 0.1 10*3/uL (ref 0.0–0.2)
Basos: 1 %
EOS (ABSOLUTE): 0.3 10*3/uL (ref 0.0–0.4)
Eos: 2 %
HEMOGLOBIN: 13.5 g/dL (ref 11.1–15.9)
Hematocrit: 38.3 % (ref 34.0–46.6)
IMMATURE GRANS (ABS): 0.1 10*3/uL (ref 0.0–0.1)
Immature Granulocytes: 1 %
LYMPHS: 42 %
Lymphocytes Absolute: 4.5 10*3/uL — ABNORMAL HIGH (ref 0.7–3.1)
MCH: 33.4 pg — ABNORMAL HIGH (ref 26.6–33.0)
MCHC: 35.2 g/dL (ref 31.5–35.7)
MCV: 95 fL (ref 79–97)
MONOCYTES: 6 %
Monocytes Absolute: 0.6 10*3/uL (ref 0.1–0.9)
Neutrophils Absolute: 5.3 10*3/uL (ref 1.4–7.0)
Neutrophils: 48 %
PLATELETS: 197 10*3/uL (ref 150–379)
RBC: 4.04 x10E6/uL (ref 3.77–5.28)
RDW: 14.7 % (ref 12.3–15.4)
WBC: 10.8 10*3/uL (ref 3.4–10.8)

## 2017-06-19 LAB — CMP14+EGFR
ALBUMIN: 4.7 g/dL (ref 3.5–4.8)
ALK PHOS: 74 IU/L (ref 39–117)
ALT: 33 IU/L — AB (ref 0–32)
AST: 24 IU/L (ref 0–40)
Albumin/Globulin Ratio: 2 (ref 1.2–2.2)
BUN / CREAT RATIO: 17 (ref 12–28)
BUN: 22 mg/dL (ref 8–27)
Bilirubin Total: 0.3 mg/dL (ref 0.0–1.2)
CHLORIDE: 100 mmol/L (ref 96–106)
CO2: 24 mmol/L (ref 20–29)
CREATININE: 1.31 mg/dL — AB (ref 0.57–1.00)
Calcium: 11.3 mg/dL — ABNORMAL HIGH (ref 8.7–10.3)
GFR calc non Af Amer: 39 mL/min/{1.73_m2} — ABNORMAL LOW (ref >59)
GFR, EST AFRICAN AMERICAN: 45 mL/min/{1.73_m2} — AB (ref >59)
GLUCOSE: 183 mg/dL — AB (ref 65–99)
Globulin, Total: 2.4 g/dL (ref 1.5–4.5)
Potassium: 4.5 mmol/L (ref 3.5–5.2)
Sodium: 139 mmol/L (ref 134–144)
TOTAL PROTEIN: 7.1 g/dL (ref 6.0–8.5)

## 2017-06-19 LAB — LIPID PANEL
CHOLESTEROL TOTAL: 188 mg/dL (ref 100–199)
Chol/HDL Ratio: 4.5 ratio — ABNORMAL HIGH (ref 0.0–4.4)
HDL: 42 mg/dL (ref >39)
LDL CALC: 74 mg/dL (ref 0–99)
Triglycerides: 358 mg/dL — ABNORMAL HIGH (ref 0–149)
VLDL Cholesterol Cal: 72 mg/dL — ABNORMAL HIGH (ref 5–40)

## 2017-06-19 LAB — VITAMIN D 25 HYDROXY (VIT D DEFICIENCY, FRACTURES): VIT D 25 HYDROXY: 24.5 ng/mL — AB (ref 30.0–100.0)

## 2017-06-19 LAB — URIC ACID: URIC ACID: 5.5 mg/dL (ref 2.5–7.1)

## 2017-06-21 ENCOUNTER — Other Ambulatory Visit: Payer: Self-pay | Admitting: Family Medicine

## 2017-06-21 MED ORDER — VITAMIN D (ERGOCALCIFEROL) 1.25 MG (50000 UNIT) PO CAPS: 50000.0000 [IU] | ORAL_CAPSULE | ORAL | 3 refills | Status: DC

## 2017-06-22 ENCOUNTER — Ambulatory Visit: Payer: Medicare Other | Attending: Family Medicine | Admitting: *Deleted

## 2017-06-22 ENCOUNTER — Encounter: Payer: Self-pay | Admitting: *Deleted

## 2017-06-22 DIAGNOSIS — M25512 Pain in left shoulder: Secondary | ICD-10-CM | POA: Diagnosis not present

## 2017-06-22 DIAGNOSIS — M25511 Pain in right shoulder: Secondary | ICD-10-CM | POA: Insufficient documentation

## 2017-06-22 DIAGNOSIS — R293 Abnormal posture: Secondary | ICD-10-CM | POA: Insufficient documentation

## 2017-06-22 DIAGNOSIS — G8929 Other chronic pain: Secondary | ICD-10-CM | POA: Diagnosis not present

## 2017-06-22 NOTE — Therapy (Signed)
[facility] [place], [place], [address] Phone: [phone]   Fax:  [phone]  Physical Therapy Treatment  Patient Details  Name: Carmen [name] MRN: [id number] Date of Birth: 04-18-1940 Referring Provider: Dr. Claretta Fraise   Encounter Date: 06/22/2017  PT End of Session - 06/22/17 1448    Visit Number  15    Number of Visits  16    Date for PT Re-Evaluation  07/08/17    PT Start Time  1430    PT Stop Time  1515    PT Time Calculation (min)  45 min       Past Medical History:  Diagnosis Date  . Anemia   . Arthritis   . CAD (coronary artery disease)   . Cancer ([place]) 11/2015   melanoma right upper arm  . Diabetes mellitus without complication ([place])   . Diverticulosis   . Gout   . Hemochromatosis, hereditary ([place]) 11/24/2012  . Hyperlipidemia   . Hypertension   . Neuromuscular disorder ([place])    peripheral neuropathy    Past Surgical History:  Procedure Laterality Date  . BREAST SURGERY     left breast lump--benign  . CARDIAC CATHETERIZATION  06/03/2005  . CORONARY ARTERY BYPASS GRAFT  2007   x2  . LIPOMA EXCISION     back    There were no vitals filed for this visit.  Subjective Assessment - 06/22/17 1446    Subjective  I have hurt all weekend for some reason. Weather?    Pertinent History  HTN, DM,  surgeries to remove melanomas, hx of heart conditions (open heart surgery 2007)    Limitations  House hold activities;Lifting    Patient Stated Goals  decrease pain, get stronger to do things at home    Currently in Pain?  Yes    Pain Score  5     Pain Location  Shoulder    Pain Orientation  Right    Pain Descriptors / Indicators  Discomfort;Tightness    Pain Onset  More than a month ago    Pain Frequency  Constant                       [facility] Adult PT Treatment/Exercise - 06/22/17 0001      Shoulder Exercises: Standing   Protraction  Strengthening;Right;20 reps;Theraband;10 reps     Theraband Level (Shoulder Protraction)  Level 2 (Red)    External Rotation  Strengthening;Right;20 reps;Theraband;10 reps    Theraband Level (Shoulder External Rotation)  Level 2 (Red)    Internal Rotation  Strengthening;Right;20 reps;Theraband;10 reps    Theraband Level (Shoulder Internal Rotation)  Level 2 (Red)    Extension  Strengthening;Right;20 reps;Theraband XTS green    Row  Strengthening;20 reps;Theraband;Both XTS green    Other Standing Exercises  X to Y x 20 no weight overhead lift      Shoulder Exercises: Pulleys   Flexion  5 minutes    Other Pulley Exercises  Seated UE ranger into flexion, circles eachway x 4 mins, Standing flexion and circles x 4 mins      Shoulder Exercises: ROM/Strengthening   UBE (Upper Arm Bike)  90 RPM x6 min (3 min forward, 3 min backward)               PT Short Term Goals - 05/25/17 1308      PT SHORT TERM GOAL #1   Title  Patient will be independent with HEP  Time  2    Status  Achieved      PT SHORT TERM GOAL #2   Title  Patient will decrease pain to less than or equal to 2/10 with ADLs.    Time  2    Period  Weeks    Status  Achieved        PT Long Term Goals - 06/10/17 1457      PT LONG TERM GOAL #1   Title  Patient will improve right shoulder flexion AROM to >170 degrees with no pain to perform ADLs.    Baseline  160 deg supine and passively in standing    Time  6    Status  On-going      PT LONG TERM GOAL #2   Title  Patient will improve bilateral shoulder flexion strength to 5/5 to perform functional activities.    Time  6    Period  Weeks    Status  On-going      PT LONG TERM GOAL #3   Title  Patient will self report ability to lift bags of groceries with less than or equal to 2/10 pain.    Baseline  Patient is able to lift groceries if bag is light with no increase of pain.    Time  6    Period  Weeks    Status  Achieved            Plan - 06/22/17 1451    Clinical Impression Statement  Pt not  doing well today due to hurting all over the last 2-3 days. She was still able to complete all therex today for both shlders and felt better after Rx . She felt mainly fatigued after Rx.Marland Kitchen She would like to continue with PT to meet LTG 's 1 and 2.    Clinical Presentation  Stable    Clinical Decision Making  Low    Rehab Potential  Good    PT Frequency  2x / week    PT Duration  6 weeks    PT Treatment/Interventions  ADLs/Self Care Home Management;Cryotherapy;Electrical Stimulation;Iontophoresis 4mg /ml Dexamethasone;Moist Heat;Ultrasound;Therapeutic exercise;Therapeutic activities;Neuromuscular re-education;Manual techniques;Passive range of motion;Dry needling    PT Next Visit Plan  Continue strengthening to improve muscular endurance and improve flexion.    PT Home Exercise Plan  flex/scap/ABD; X to Y overhead lift in standing. horizontal abduction, wall washes in flexion and wall circles.    Consulted and Agree with Plan of Care  Patient       Patient will benefit from skilled therapeutic intervention in order to improve the following deficits and impairments:  Pain, Decreased range of motion, Postural dysfunction, Impaired UE functional use  Visit Diagnosis: Chronic right shoulder pain  Chronic left shoulder pain  Abnormal posture     Problem List Patient Active Problem List   Diagnosis Date Noted  . Chronic pain of both shoulders 04/27/2017  . Vitamin D deficiency 01/28/2016  . Controlled type 2 diabetes mellitus with diabetic neuropathy, without long-term current use of insulin ([place]) 11/12/2015  . Statin intolerance 08/18/2013  . Neuromuscular disorder ([place])   . Hemochromatosis, hereditary ([place]) 11/24/2012  . S/P CABG x 2 08/09/2012  . PVC's (premature ventricular contractions) 08/09/2012  . Gout 06/20/2012  . HTN (hypertension) 06/20/2012  . HLD (hyperlipidemia) 06/20/2012  . CAD (coronary artery disease) 06/20/2012  . Arthritis 06/20/2012    Sonny Anthes,CHRIS,  PTA 06/22/2017, 3:36 PM  [facility] [place], [place],  [place] Phone: [phone]   Fax:  [phone]  Name: Carmen [name] MRN: [id number] Date of Birth: 1940-04-01

## 2017-06-24 ENCOUNTER — Ambulatory Visit: Payer: Medicare Other | Admitting: Physical Therapy

## 2017-06-24 ENCOUNTER — Encounter: Payer: Self-pay | Admitting: Physical Therapy

## 2017-06-24 DIAGNOSIS — M25512 Pain in left shoulder: Secondary | ICD-10-CM

## 2017-06-24 DIAGNOSIS — R293 Abnormal posture: Secondary | ICD-10-CM

## 2017-06-24 DIAGNOSIS — G8929 Other chronic pain: Secondary | ICD-10-CM

## 2017-06-24 DIAGNOSIS — M25511 Pain in right shoulder: Secondary | ICD-10-CM | POA: Diagnosis not present

## 2017-06-24 NOTE — Therapy (Signed)
[facility] [place], [place], [address] Phone: [phone]   Fax:  [phone]  Physical Therapy Treatment  Patient Details  Name: Carmen [name] MRN: [id number] Date of Birth: [birth year] Referring Provider: Dr. Claretta Fraise   Encounter Date: 06/24/2017  PT End of Session - 06/24/17 1450    Visit Number  16    Number of Visits  20    Date for PT Re-Evaluation  07/08/17    Authorization Type  FOTO every 5th visit taken 06-10-17 41%    PT Start Time  1430    PT Stop Time  1512    PT Time Calculation (min)  42 min    Activity Tolerance  Patient tolerated treatment well    Behavior During Therapy  [facility] for tasks assessed/performed       Past Medical History:  Diagnosis Date  . Anemia   . Arthritis   . CAD (coronary artery disease)   . Cancer ([place]) 11/2015   melanoma right upper arm  . Diabetes mellitus without complication ([place])   . Diverticulosis   . Gout   . Hemochromatosis, hereditary ([place]) 11/24/2012  . Hyperlipidemia   . Hypertension   . Neuromuscular disorder ([place])    peripheral neuropathy    Past Surgical History:  Procedure Laterality Date  . BREAST SURGERY     left breast lump--benign  . CARDIAC CATHETERIZATION  06/03/2005  . CORONARY ARTERY BYPASS GRAFT  2007   x2  . LIPOMA EXCISION     back    There were no vitals filed for this visit.  Subjective Assessment - 06/24/17 1434    Subjective  Patient reported some ongoing discomfort from a flare up    Pertinent History  HTN, DM,  surgeries to remove melanomas, hx of heart conditions (open heart surgery 2007)    Limitations  House hold activities;Lifting    Patient Stated Goals  decrease pain, get stronger to do things at home    Currently in Pain?  Yes    Pain Score  5     Pain Location  Shoulder    Pain Orientation  Right    Pain Descriptors / Indicators  Discomfort    Pain Type  Chronic pain    Pain Onset  More than a month ago    Pain  Frequency  Constant    Aggravating Factors   lifting and prolong overhead movement    Pain Relieving Factors  meds          [facility] PT Assessment - 06/24/17 0001      AROM   AROM Assessment Site  Shoulder    Right/Left Shoulder  Right    Right Shoulder Flexion  135 Degrees      PROM   PROM Assessment Site  Shoulder    Right/Left Shoulder  Right    Right Shoulder Flexion  160 Degrees                   [facility] Adult PT Treatment/Exercise - 06/24/17 0001      Shoulder Exercises: Standing   Protraction  Strengthening;20 reps;Theraband;10 reps;Both    Theraband Level (Shoulder Protraction)  Level 2 (Red)    External Rotation  Strengthening;20 reps;Theraband;10 reps;Both    Theraband Level (Shoulder External Rotation)  Level 2 (Red)    Internal Rotation  Strengthening;20 reps;Theraband;10 reps;Both    Theraband Level (Shoulder Internal Rotation)  Level 2 (Red)    Flexion  Strengthening;Both;20 reps;Weights;Limitations;Other (comment)  Shoulder Flexion Weight (lbs)  1    Extension  Strengthening;20 reps;Theraband;10 reps;Both    Theraband Level (Shoulder Extension)  Level 2 (Red)    Other Standing Exercises  wall slide then circles x20 each     Other Standing Exercises  X to Y x 20 no weight overhead lift      Shoulder Exercises: Pulleys   Flexion  5 minutes    Other Pulley Exercises  Seated UE ranger into flexion, circles eachway x 4 mins, Standing flexion and circles x 4 mins      Shoulder Exercises: ROM/Strengthening   UBE (Upper Arm Bike)  90 RPM x6 min (3 min forward, 3 min backward)               PT Short Term Goals - 05/25/17 1308      PT SHORT TERM GOAL #1   Title  Patient will be independent with HEP    Time  2    Status  Achieved      PT SHORT TERM GOAL #2   Title  Patient will decrease pain to less than or equal to 2/10 with ADLs.    Time  2    Period  Weeks    Status  Achieved        PT Long Term Goals - 06/10/17 1457      PT LONG  TERM GOAL #1   Title  Patient will improve right shoulder flexion AROM to >170 degrees with no pain to perform ADLs.    Baseline  160 deg supine and passively in standing    Time  6    Status  On-going      PT LONG TERM GOAL #2   Title  Patient will improve bilateral shoulder flexion strength to 5/5 to perform functional activities.    Time  6    Period  Weeks    Status  On-going      PT LONG TERM GOAL #3   Title  Patient will self report ability to lift bags of groceries with less than or equal to 2/10 pain.    Baseline  Patient is able to lift groceries if bag is light with no increase of pain.    Time  6    Period  Weeks    Status  Achieved            Plan - 06/24/17 1453    Clinical Impression Statement  Patient tolerated treatment well today and able to progress exercises for right shoulder ROM and bil shoulder strengthening. Patient has some ongoing soreness and ROM limitations at this time. goals ongoing.      Rehab Potential  Good    PT Frequency  2x / week    PT Duration  6 weeks    PT Treatment/Interventions  ADLs/Self Care Home Management;Cryotherapy;Electrical Stimulation;Iontophoresis 4mg /ml Dexamethasone;Moist Heat;Ultrasound;Therapeutic exercise;Therapeutic activities;Neuromuscular re-education;Manual techniques;Passive range of motion;Dry needling    PT Next Visit Plan  Continue strengthening to improve muscular endurance and improve flexion.    Consulted and Agree with Plan of Care  Patient       Patient will benefit from skilled therapeutic intervention in order to improve the following deficits and impairments:  Pain, Decreased range of motion, Postural dysfunction, Impaired UE functional use  Visit Diagnosis: Chronic right shoulder pain  Chronic left shoulder pain  Abnormal posture     Problem List Patient Active Problem List   Diagnosis Date Noted  . Chronic pain of both  shoulders 04/27/2017  . Vitamin D deficiency 01/28/2016  . Controlled  type 2 diabetes mellitus with diabetic neuropathy, without long-term current use of insulin ([place]) 11/12/2015  . Statin intolerance 08/18/2013  . Neuromuscular disorder ([place])   . Hemochromatosis, hereditary ([place]) 11/24/2012  . S/P CABG x 2 08/09/2012  . PVC's (premature ventricular contractions) 08/09/2012  . Gout 06/20/2012  . HTN (hypertension) 06/20/2012  . HLD (hyperlipidemia) 06/20/2012  . CAD (coronary artery disease) 06/20/2012  . Arthritis 06/20/2012    Phillips Climes, PTA 06/24/2017, 3:14 PM  [facility] [address] [place], [place], [address] Phone: [phone]   Fax:  [phone]  Name: Carmen [name] MRN: [id number] Date of Birth: 1940/06/17

## 2017-06-26 ENCOUNTER — Encounter: Payer: Self-pay | Admitting: Family Medicine

## 2017-06-29 ENCOUNTER — Ambulatory Visit: Payer: Medicare Other | Admitting: Physical Therapy

## 2017-06-29 DIAGNOSIS — M25511 Pain in right shoulder: Principal | ICD-10-CM

## 2017-06-29 DIAGNOSIS — R293 Abnormal posture: Secondary | ICD-10-CM

## 2017-06-29 DIAGNOSIS — M25512 Pain in left shoulder: Secondary | ICD-10-CM

## 2017-06-29 DIAGNOSIS — G8929 Other chronic pain: Secondary | ICD-10-CM

## 2017-06-29 NOTE — Therapy (Signed)
[facility] [place], [place], [address] Phone: [phone]   Fax:  [phone]  Physical Therapy Treatment  Patient Details  Name: Carmen [name] MRN: [id number] Date of Birth: 1940/10/13 Referring Provider: Dr. Claretta Fraise   Encounter Date: 06/29/2017  PT End of Session - 06/29/17 1438    Visit Number  17    Number of Visits  20    Date for PT Re-Evaluation  07/08/17    Authorization Type  FOTO every 5th visit taken 06-10-17 41%    PT Start Time  1430    PT Stop Time  1517    PT Time Calculation (min)  47 min    Activity Tolerance  Patient tolerated treatment well    Behavior During Therapy  [facility] for tasks assessed/performed       Past Medical History:  Diagnosis Date  . Anemia   . Arthritis   . CAD (coronary artery disease)   . Cancer ([place]) 11/2015   melanoma right upper arm  . Diabetes mellitus without complication ([place])   . Diverticulosis   . Gout   . Hemochromatosis, hereditary ([place]) 11/24/2012  . Hyperlipidemia   . Hypertension   . Neuromuscular disorder ([place])    peripheral neuropathy    Past Surgical History:  Procedure Laterality Date  . BREAST SURGERY     left breast lump--benign  . CARDIAC CATHETERIZATION  06/03/2005  . CORONARY ARTERY BYPASS GRAFT  2007   x2  . LIPOMA EXCISION     back    There were no vitals filed for this visit.  Subjective Assessment - 06/29/17 1441    Subjective  Patient reported feeling a "knot" in R shoulder and reported she may have slept wrong on it.     Pertinent History  HTN, DM,  surgeries to remove melanomas, hx of heart conditions (open heart surgery 2007)    Limitations  House hold activities;Lifting    Patient Stated Goals  decrease pain, get stronger to do things at home    Currently in Pain?  No/denies    Pain Score  0-No pain                       [facility] Adult PT Treatment/Exercise - 06/29/17 0001      Shoulder Exercises: Standing    Protraction  Strengthening;20 reps;Theraband;10 reps;Both    Theraband Level (Shoulder Protraction)  Level 2 (Red)    External Rotation  Strengthening;20 reps;Theraband;10 reps;Both    Theraband Level (Shoulder External Rotation)  Level 2 (Red)    Internal Rotation  Strengthening;20 reps;Theraband;10 reps;Both    Theraband Level (Shoulder Internal Rotation)  Level 2 (Red)    Other Standing Exercises  Standing UE ranger flexion and circles x20 each    Other Standing Exercises  D2 flexion with yellow theraband x20 bilaterally      Shoulder Exercises: Pulleys   Flexion  5 minutes      Manual Therapy   Manual Therapy  Soft tissue mobilization;Passive ROM    Soft tissue mobilization  STW/MFR to right UT, right bicep, and right triceps region to reduce muscle tightness               PT Short Term Goals - 05/25/17 1308      PT SHORT TERM GOAL #1   Title  Patient will be independent with HEP    Time  2    Status  Achieved  PT SHORT TERM GOAL #2   Title  Patient will decrease pain to less than or equal to 2/10 with ADLs.    Time  2    Period  Weeks    Status  Achieved        PT Long Term Goals - 06/10/17 1457      PT LONG TERM GOAL #1   Title  Patient will improve right shoulder flexion AROM to >170 degrees with no pain to perform ADLs.    Baseline  160 deg supine and passively in standing    Time  6    Status  On-going      PT LONG TERM GOAL #2   Title  Patient will improve bilateral shoulder flexion strength to 5/5 to perform functional activities.    Time  6    Period  Weeks    Status  On-going      PT LONG TERM GOAL #3   Title  Patient will self report ability to lift bags of groceries with less than or equal to 2/10 pain.    Baseline  Patient is able to lift groceries if bag is light with no increase of pain.    Time  6    Period  Weeks    Status  Achieved            Plan - 06/29/17 1620    Clinical Impression Statement  Patient was able to  tolerate treatment well today with no reports of pain or soreness. Patient noted with decreased pain in R shoulder after STW/M. Patient noted with increased pain at end ranges.     Clinical Presentation  Stable    Clinical Decision Making  Low    Rehab Potential  Good    PT Frequency  2x / week    PT Duration  6 weeks    PT Treatment/Interventions  ADLs/Self Care Home Management;Cryotherapy;Electrical Stimulation;Iontophoresis 4mg /ml Dexamethasone;Moist Heat;Ultrasound;Therapeutic exercise;Therapeutic activities;Neuromuscular re-education;Manual techniques;Passive range of motion;Dry needling    PT Next Visit Plan  Continue strengthening to improve muscular endurance and improve flexion.    Consulted and Agree with Plan of Care  Patient       Patient will benefit from skilled therapeutic intervention in order to improve the following deficits and impairments:  Pain, Decreased range of motion, Postural dysfunction, Impaired UE functional use  Visit Diagnosis: Chronic right shoulder pain  Chronic left shoulder pain  Abnormal posture     Problem List Patient Active Problem List   Diagnosis Date Noted  . Chronic pain of both shoulders 04/27/2017  . Vitamin D deficiency 01/28/2016  . Controlled type 2 diabetes mellitus with diabetic neuropathy, without long-term current use of insulin ([place]) 11/12/2015  . Statin intolerance 08/18/2013  . Neuromuscular disorder ([place])   . Hemochromatosis, hereditary ([place]) 11/24/2012  . S/P CABG x 2 08/09/2012  . PVC's (premature ventricular contractions) 08/09/2012  . Gout 06/20/2012  . HTN (hypertension) 06/20/2012  . HLD (hyperlipidemia) 06/20/2012  . CAD (coronary artery disease) 06/20/2012  . Arthritis 06/20/2012   Carmen [name], PT, DPT 06/29/2017, 8:19 PM  [facility] [address] [name], [place], [address] Phone: [phone]   Fax:  [phone]  Name: [name] MRN:  [id number] Date of Birth: May 20, 1940

## 2017-07-01 ENCOUNTER — Ambulatory Visit: Payer: Medicare Other | Admitting: Physical Therapy

## 2017-07-01 ENCOUNTER — Encounter: Payer: Self-pay | Admitting: Physical Therapy

## 2017-07-01 DIAGNOSIS — M25511 Pain in right shoulder: Principal | ICD-10-CM

## 2017-07-01 DIAGNOSIS — R293 Abnormal posture: Secondary | ICD-10-CM

## 2017-07-01 DIAGNOSIS — M25512 Pain in left shoulder: Secondary | ICD-10-CM | POA: Diagnosis not present

## 2017-07-01 DIAGNOSIS — G8929 Other chronic pain: Secondary | ICD-10-CM | POA: Diagnosis not present

## 2017-07-01 NOTE — Therapy (Signed)
[facility] [place], [place], [address] Phone: [phone]   Fax:  [phone]  Physical Therapy Treatment  Patient Details  Name: Carmen [name] MRN: [id number] Date of Birth: May 03, 1940 Referring Provider: Dr. Claretta Fraise   Encounter Date: 07/01/2017  PT End of Session - 07/01/17 1448    Visit Number  18    Number of Visits  20    Date for PT Re-Evaluation  07/21/17    Authorization Type  FOTO every 5th visit    PT Start Time  1429    PT Stop Time  1511    PT Time Calculation (min)  42 min    Activity Tolerance  Patient tolerated treatment well    Behavior During Therapy  [facility] for tasks assessed/performed       Past Medical History:  Diagnosis Date  . Anemia   . Arthritis   . CAD (coronary artery disease)   . Cancer ([place]) 11/2015   melanoma right upper arm  . Diabetes mellitus without complication ([place])   . Diverticulosis   . Gout   . Hemochromatosis, hereditary ([place]) 11/24/2012  . Hyperlipidemia   . Hypertension   . Neuromuscular disorder ([place])    peripheral neuropathy    Past Surgical History:  Procedure Laterality Date  . BREAST SURGERY     left breast lump--benign  . CARDIAC CATHETERIZATION  06/03/2005  . CORONARY ARTERY BYPASS GRAFT  2007   x2  . LIPOMA EXCISION     back    There were no vitals filed for this visit.  Subjective Assessment - 07/01/17 1432    Subjective  Patient felt better after last treatment, some ongoing soreness all week    Pertinent History  HTN, DM,  surgeries to remove melanomas, hx of heart conditions (open heart surgery 2007)    Limitations  House hold activities;Lifting    Patient Stated Goals  decrease pain, get stronger to do things at home    Currently in Pain?  No/denies no pain number given just sore    Pain Location  Shoulder    Pain Orientation  Right    Pain Descriptors / Indicators  Sore    Pain Type  Chronic pain    Pain Onset  More than a month ago     Pain Frequency  Constant    Aggravating Factors   lifting or overhead movement    Pain Relieving Factors  meds                       [facility] Adult PT Treatment/Exercise - 07/01/17 0001      Shoulder Exercises: Seated   Horizontal ABduction  Strengthening;Both;20 reps;Theraband    Theraband Level (Shoulder Horizontal ABduction)  Level 1 (Yellow)    Other Seated Exercises  cane for flexion and chest press x20 each    Other Seated Exercises  grey ball overhead 2x10 BIL      Shoulder Exercises: Standing   Other Standing Exercises  Standing UE ranger flexion and circles x20 each    Other Standing Exercises  D2 flexion with yellow theraband x20 bilaterally      Shoulder Exercises: Pulleys   Flexion  5 minutes      Shoulder Exercises: ROM/Strengthening   Wall Wash  2x10 Rt shoulder    Wall Pushups  10 reps    Other ROM/Strengthening Exercises  seated for flexion eccentric control with yellow t-band x20    Other ROM/Strengthening  Exercises  seated for flexion with yellow t-band 2x10      Shoulder Exercises: Power Warden/ranger Exercises  cone stacking to shoulder level x fatigue               PT Short Term Goals - 05/25/17 1308      PT SHORT TERM GOAL #1   Title  Patient will be independent with HEP    Time  2    Status  Achieved      PT SHORT TERM GOAL #2   Title  Patient will decrease pain to less than or equal to 2/10 with ADLs.    Time  2    Period  Weeks    Status  Achieved        PT Long Term Goals - 06/10/17 1457      PT LONG TERM GOAL #1   Title  Patient will improve right shoulder flexion AROM to >170 degrees with no pain to perform ADLs.    Baseline  160 deg supine and passively in standing    Time  6    Status  On-going      PT LONG TERM GOAL #2   Title  Patient will improve bilateral shoulder flexion strength to 5/5 to perform functional activities.    Time  6    Period  Weeks    Status  On-going      PT LONG TERM  GOAL #3   Title  Patient will self report ability to lift bags of groceries with less than or equal to 2/10 pain.    Baseline  Patient is able to lift groceries if bag is light with no increase of pain.    Time  6    Period  Weeks    Status  Achieved            Plan - 07/01/17 1501    Clinical Impression Statement  Patient tolerated treatment well today. Patient able to progress with shoulder strengtheing esp right UE due to weakness and soreness with overhead movement. Patient did well with bil UE movement and more difficulty with just uni RT UE overhead movement. Goals ongoing.     Clinical Presentation due to:  FOTO 18th visit 34% LIMITATION (initial 45%)    Rehab Potential  Good    PT Frequency  2x / week    PT Duration  6 weeks    PT Treatment/Interventions  ADLs/Self Care Home Management;Cryotherapy;Electrical Stimulation;Iontophoresis 4mg /ml Dexamethasone;Moist Heat;Ultrasound;Therapeutic exercise;Therapeutic activities;Neuromuscular re-education;Manual techniques;Passive range of motion;Dry needling    PT Next Visit Plan  Continue strengthening to improve muscular endurance and improve flexion.    Consulted and Agree with Plan of Care  Patient       Patient will benefit from skilled therapeutic intervention in order to improve the following deficits and impairments:  Pain, Decreased range of motion, Postural dysfunction, Impaired UE functional use  Visit Diagnosis: Chronic right shoulder pain  Chronic left shoulder pain  Abnormal posture     Problem List Patient Active Problem List   Diagnosis Date Noted  . Chronic pain of both shoulders 04/27/2017  . Vitamin D deficiency 01/28/2016  . Controlled type 2 diabetes mellitus with diabetic neuropathy, without long-term current use of insulin ([place]) 11/12/2015  . Statin intolerance 08/18/2013  . Neuromuscular disorder ([place])   . Hemochromatosis, hereditary ([place]) 11/24/2012  . S/P CABG x 2 08/09/2012  . PVC's (premature  ventricular contractions) 08/09/2012  . Gout  06/20/2012  . HTN (hypertension) 06/20/2012  . HLD (hyperlipidemia) 06/20/2012  . CAD (coronary artery disease) 06/20/2012  . Arthritis 06/20/2012    Phillips Climes, PTA 07/01/2017, 3:12 PM  [facility] [place], [place], [address] Phone: [phone]   Fax:  [phone]  Name: Carmen [name] MRN: [id number] Date of Birth: 10-Sep-1940

## 2017-07-06 ENCOUNTER — Ambulatory Visit: Payer: Medicare Other | Admitting: Physical Therapy

## 2017-07-06 DIAGNOSIS — R293 Abnormal posture: Secondary | ICD-10-CM

## 2017-07-06 DIAGNOSIS — M25511 Pain in right shoulder: Secondary | ICD-10-CM | POA: Diagnosis not present

## 2017-07-06 DIAGNOSIS — G8929 Other chronic pain: Secondary | ICD-10-CM | POA: Diagnosis not present

## 2017-07-06 DIAGNOSIS — M25512 Pain in left shoulder: Secondary | ICD-10-CM

## 2017-07-06 NOTE — Therapy (Signed)
[facility] [place], [place], [address] Phone: [phone]   Fax:  [phone]  Physical Therapy Treatment  Patient Details  Name: Carmen [name] MRN: [id number] Date of Birth: 06/12/1940 Referring Provider: Dr. Claretta Fraise   Encounter Date: 07/06/2017  PT End of Session - 07/06/17 1441    Visit Number  19    Number of Visits  20    Date for PT Re-Evaluation  07/21/17    Authorization Type  FOTO every 5th visit    PT Start Time  1430    PT Stop Time  1517    PT Time Calculation (min)  47 min    Activity Tolerance  Patient tolerated treatment well    Behavior During Therapy  [facility] for tasks assessed/performed       Past Medical History:  Diagnosis Date  . Anemia   . Arthritis   . CAD (coronary artery disease)   . Cancer ([place]) 11/2015   melanoma right upper arm  . Diabetes mellitus without complication ([place])   . Diverticulosis   . Gout   . Hemochromatosis, hereditary ([place]) 11/24/2012  . Hyperlipidemia   . Hypertension   . Neuromuscular disorder ([place])    peripheral neuropathy    Past Surgical History:  Procedure Laterality Date  . BREAST SURGERY     left breast lump--benign  . CARDIAC CATHETERIZATION  06/03/2005  . CORONARY ARTERY BYPASS GRAFT  2007   x2  . LIPOMA EXCISION     back    There were no vitals filed for this visit.  Subjective Assessment - 07/06/17 1442    Subjective  Patient reported feeling "alright"      Pertinent History  HTN, DM,  surgeries to remove melanomas, hx of heart conditions (open heart surgery 2007)    Limitations  House hold activities;Lifting    Patient Stated Goals  decrease pain, get stronger to do things at home    Currently in Pain?  No/denies                       [facility] Adult PT Treatment/Exercise - 07/06/17 0001      Shoulder Exercises: Standing   Protraction  Strengthening;20 reps;Theraband;10 reps;Both    Theraband Level (Shoulder  Protraction)  Level 2 (Red)    External Rotation  Strengthening;20 reps;Theraband;10 reps;Both    Theraband Level (Shoulder External Rotation)  Level 2 (Red)    Internal Rotation  Strengthening;20 reps;Theraband;10 reps;Both    Theraband Level (Shoulder Internal Rotation)  Level 2 (Red)    Row  Strengthening;20 reps;Theraband;Both    Theraband Level (Shoulder Row)  Level 2 (Red)      Shoulder Exercises: ROM/Strengthening   UBE (Upper Arm Bike)  90 RPM x8 min (4 min forward, 4 min backward)    X to V Arms  x30      Manual Therapy   Manual Therapy  Soft tissue mobilization;Passive ROM    Soft tissue mobilization  STW/MFR to right UT, right bicep, and right triceps region to reduce muscle tension    Passive ROM  PROM into flexion and abduction to improve ROM               PT Short Term Goals - 05/25/17 1308      PT SHORT TERM GOAL #1   Title  Patient will be independent with HEP    Time  2    Status  Achieved  PT SHORT TERM GOAL #2   Title  Patient will decrease pain to less than or equal to 2/10 with ADLs.    Time  2    Period  Weeks    Status  Achieved        PT Long Term Goals - 06/10/17 1457      PT LONG TERM GOAL #1   Title  Patient will improve right shoulder flexion AROM to >170 degrees with no pain to perform ADLs.    Baseline  160 deg supine and passively in standing    Time  6    Status  On-going      PT LONG TERM GOAL #2   Title  Patient will improve bilateral shoulder flexion strength to 5/5 to perform functional activities.    Time  6    Period  Weeks    Status  On-going      PT LONG TERM GOAL #3   Title  Patient will self report ability to lift bags of groceries with less than or equal to 2/10 pain.    Baseline  Patient is able to lift groceries if bag is light with no increase of pain.    Time  6    Period  Weeks    Status  Achieved            Plan - 07/06/17 1547    Clinical Impression Statement  Patient tolerated treatment well  today. Patient noted with increased fatigue with X to V exercise. Patient reported "sharp pull" in posterior shoulder while in supine. Patient noted pain decreased after STW/M to area.  PT and patient discussed DC next visit; patient reported agreement.     Clinical Presentation  Stable    Clinical Decision Making  Low    Rehab Potential  Good    PT Frequency  2x / week    PT Duration  6 weeks    PT Treatment/Interventions  ADLs/Self Care Home Management;Cryotherapy;Electrical Stimulation;Iontophoresis 4mg /ml Dexamethasone;Moist Heat;Ultrasound;Therapeutic exercise;Therapeutic activities;Neuromuscular re-education;Manual techniques;Passive range of motion;Dry needling    PT Next Visit Plan  Review HEP. D/C    Consulted and Agree with Plan of Care  Patient       Patient will benefit from skilled therapeutic intervention in order to improve the following deficits and impairments:  Pain, Decreased range of motion, Postural dysfunction, Impaired UE functional use  Visit Diagnosis: Chronic right shoulder pain  Chronic left shoulder pain  Abnormal posture     Problem List Patient Active Problem List   Diagnosis Date Noted  . Chronic pain of both shoulders 04/27/2017  . Vitamin D deficiency 01/28/2016  . Controlled type 2 diabetes mellitus with diabetic neuropathy, without long-term current use of insulin ([place]) 11/12/2015  . Statin intolerance 08/18/2013  . Neuromuscular disorder ([place])   . Hemochromatosis, hereditary ([place]) 11/24/2012  . S/P CABG x 2 08/09/2012  . PVC's (premature ventricular contractions) 08/09/2012  . Gout 06/20/2012  . HTN (hypertension) 06/20/2012  . HLD (hyperlipidemia) 06/20/2012  . CAD (coronary artery disease) 06/20/2012  . Arthritis 06/20/2012   Gabriela Eves, PT, DPT 07/06/2017, 4:04 PM  [facility] [address] [place], [place], [address] Phone: [phone]   Fax:  [phone]  Name: Carmen [name] MRN: [id number] Date of Birth: [birth year]

## 2017-07-13 ENCOUNTER — Ambulatory Visit: Payer: Medicare Other | Admitting: *Deleted

## 2017-07-13 DIAGNOSIS — R293 Abnormal posture: Secondary | ICD-10-CM

## 2017-07-13 DIAGNOSIS — M25511 Pain in right shoulder: Secondary | ICD-10-CM | POA: Diagnosis not present

## 2017-07-13 DIAGNOSIS — G8929 Other chronic pain: Secondary | ICD-10-CM

## 2017-07-13 DIAGNOSIS — M25512 Pain in left shoulder: Secondary | ICD-10-CM

## 2017-07-13 NOTE — Therapy (Signed)
[facility] [place], [place], [address] Phone: [phone]   Fax:  [phone]  Physical Therapy Treatment  Patient Details  Name: Carmen [name] MRN: [id number] Date of Birth: 04-23-1940 Referring Provider: Dr. Claretta Fraise   Encounter Date: 07/13/2017  PT End of Session - 07/13/17 1450    Visit Number  20    Number of Visits  20    Date for PT Re-Evaluation  07/21/17    Authorization Type  FOTO every 5th visit    PT Start Time  1435    PT Stop Time  1519    PT Time Calculation (min)  44 min       Past Medical History:  Diagnosis Date  . Anemia   . Arthritis   . CAD (coronary artery disease)   . Cancer ([place]) 11/2015   melanoma right upper arm  . Diabetes mellitus without complication ([place])   . Diverticulosis   . Gout   . Hemochromatosis, hereditary ([place]) 11/24/2012  . Hyperlipidemia   . Hypertension   . Neuromuscular disorder ([place])    peripheral neuropathy    Past Surgical History:  Procedure Laterality Date  . BREAST SURGERY     left breast lump--benign  . CARDIAC CATHETERIZATION  06/03/2005  . CORONARY ARTERY BYPASS GRAFT  2007   x2  . LIPOMA EXCISION     back    There were no vitals filed for this visit.  Subjective Assessment - 07/13/17 1752    Subjective  Doing ok ,but LT shldr is hurting now.    Pertinent History  HTN, DM,  surgeries to remove melanomas, hx of heart conditions (open heart surgery 2007)    Limitations  House hold activities;Lifting    Patient Stated Goals  decrease pain, get stronger to do things at home    Pain Score  2     Pain Location  Shoulder    Pain Orientation  Right    Pain Descriptors / Indicators  Sore    Pain Onset  More than a month ago    Pain Score  5    Pain Location  Shoulder    Pain Orientation  Left                       [facility] Adult PT Treatment/Exercise - 07/13/17 0001      Shoulder Exercises: Standing   Protraction   Strengthening;20 reps;Theraband;10 reps;Both    Theraband Level (Shoulder Protraction)  Level 2 (Red)    External Rotation  Strengthening;20 reps;Theraband;10 reps;Both    Theraband Level (Shoulder External Rotation)  Level 2 (Red)    Internal Rotation  Strengthening;20 reps;Theraband;10 reps;Both    Theraband Level (Shoulder Internal Rotation)  Level 2 (Red)    Row  Strengthening;20 reps;Theraband;Both    Theraband Level (Shoulder Row)  Level 2 (Red)      Shoulder Exercises: ROM/Strengthening   UBE (Upper Arm Bike)  90 RPM x6 min (15mn forward, 3 min backward)    X to V Arms  x30      Manual Therapy   Manual Therapy  Soft tissue mobilization;Passive ROM    Soft tissue mobilization  STW/MFR to right UT, right bicep, and right triceps region to reduce muscle tension    Passive ROM  PROM into flexion and abduction to improve ROM               PT Short Term Goals - 05/25/17 1308  PT SHORT TERM GOAL #1   Title  Patient will be independent with HEP    Time  2    Status  Achieved      PT SHORT TERM GOAL #2   Title  Patient will decrease pain to less than or equal to 2/10 with ADLs.    Time  2    Period  Weeks    Status  Achieved        PT Long Term Goals - 07/13/17 1452      PT LONG TERM GOAL #1   Title  Patient will improve right shoulder flexion AROM to >170 degrees with no pain to perform ADLs.    Baseline  150 deg AROM in standing    Period  Weeks    Status  Not Met      PT LONG TERM GOAL #2   Title  Patient will improve bilateral shoulder flexion strength to 5/5 to perform functional activities.    Baseline  4-/5 due to pain    Time  6    Period  Weeks    Status  Not Met      PT LONG TERM GOAL #3   Title  Patient will self report ability to lift bags of groceries with less than or equal to 2/10 pain.    Baseline  Patient is able to lift groceries if bag is light with no increase of pain.    Time  6    Period  Weeks    Status  Achieved             Plan - 07/13/17 1520    Clinical Impression Statement  Pt states her RT shldr feels 80% better since starting PT, but LT shldr has starting getting worse the last 4 days. She will DC today and F/U with MD about a referral to see an Orthopedic . She was instructed in AROM and strengthening exs for HEP. 1/3 of LTGs were met for RT shldr. Others NM due to weakness and pain.  Her LT shldr was doing ok with minimal complaints, but she reports that the pain has increased in the last 4 days. Her FOTO score was 39 %       Patient will benefit from skilled therapeutic intervention in order to improve the following deficits and impairments:     Visit Diagnosis: Chronic right shoulder pain  Chronic left shoulder pain  Abnormal posture     Problem List Patient Active Problem List   Diagnosis Date Noted  . Chronic pain of both shoulders 04/27/2017  . Vitamin D deficiency 01/28/2016  . Controlled type 2 diabetes mellitus with diabetic neuropathy, without long-term current use of insulin ([place]) 11/12/2015  . Statin intolerance 08/18/2013  . Neuromuscular disorder ([place])   . Hemochromatosis, hereditary ([place]) 11/24/2012  . S/P CABG x 2 08/09/2012  . PVC's (premature ventricular contractions) 08/09/2012  . Gout 06/20/2012  . HTN (hypertension) 06/20/2012  . HLD (hyperlipidemia) 06/20/2012  . CAD (coronary artery disease) 06/20/2012  . Arthritis 06/20/2012     PHYSICAL THERAPY DISCHARGE SUMMARY  Visits from Start of Care: 20  Current functional level related to goals / functional outcomes: See above    Remaining deficits: See Goals   Education / Equipment: HEP Plan: Patient agrees to discharge.  Patient goals were partially met. Patient is being discharged due to the patient's request.  ?????       RAMSEUR,CHRIS, PTA 07/13/2017, 5:56 PM   [facility] [place], [place], [address] Phone: [phone]   Fax:   [phone]  Name: Carmen [name] MRN: [id number] Date of Birth: [birth year]

## 2017-07-19 ENCOUNTER — Encounter: Payer: Self-pay | Admitting: Family Medicine

## 2017-07-19 ENCOUNTER — Ambulatory Visit (INDEPENDENT_AMBULATORY_CARE_PROVIDER_SITE_OTHER): Payer: Medicare Other | Admitting: Family Medicine

## 2017-07-19 VITALS — BP 126/65 | HR 68 | Ht 63.0 in | Wt 190.2 lb

## 2017-07-19 DIAGNOSIS — M7541 Impingement syndrome of right shoulder: Secondary | ICD-10-CM

## 2017-07-19 MED ORDER — MELOXICAM 7.5 MG PO TABS: 7.5000 mg | ORAL_TABLET | Freq: Every day | ORAL | 5 refills | Status: DC

## 2017-07-19 MED ORDER — PREGABALIN 75 MG PO CAPS: 75.0000 mg | ORAL_CAPSULE | Freq: Two times a day (BID) | ORAL | 2 refills | Status: DC

## 2017-07-19 NOTE — Patient Instructions (Signed)
Consider ortho referral  Continue home physical therapy

## 2017-07-19 NOTE — Progress Notes (Signed)
Subjective:  Patient ID: Carmen [name], female    DOB: [birth year]  Age: 77 y.o. MRN: [id number]  CC: Shoulder Pain (pt here today f/u on her shoulder pain which is somewhat better but still limited to what she can do)   HPI Carmen [name] presents for right shoulder pain persistent in spite of physical therapy.  It has improved somewhat.  However she is gone to 20 visits and since the year only allows for 32 visits she wants to start doing exercises at home only until later in the year.  She is concerned that with only 12 visits that she has another illness she will be left without coverage.  Therefore she has home exercises and she wants to those and see how she does.  She would like to have a medication to take the place of methylprednisolone she just finished.  She has taken NSAIDs in the past.  She has been told on occasion not to take certain ones.  She does not know why.  She has not had a history of GI problems such as reflux ulcer melena or active bleed.  She is allergic to sulfa limiting her use of Celebrex.  Patient has pain with abduction of the shoulder.  She can actively move it slowly as long as she moves it upward to about 30 degrees then lets it down and slowly moves it about 45 degrees and then slowly moves it upward again to about 60 degrees but unfortunately she cannot abduct above 90.  Depression screen [facility] 2/9 06/18/2017 04/27/2017 03/08/2017  Decreased Interest 0 0 0  Down, Depressed, Hopeless 0 0 0  PHQ - 2 Score 0 0 0  Altered sleeping - - -  Tired, decreased energy - - -  Change in appetite - - -  Feeling bad or failure about yourself  - - -  Trouble concentrating - - -  Moving slowly or fidgety/restless - - -  Suicidal thoughts - - -  PHQ-9 Score - - -  Difficult doing work/chores - - -    History Krystiana has a past medical history of Anemia, Arthritis, CAD (coronary artery disease), Cancer ([place]) (11/2015), Diabetes mellitus without complication ([place]),  Diverticulosis, Gout, Hemochromatosis, hereditary ([place]) (11/24/2012), Hyperlipidemia, Hypertension, and Neuromuscular disorder ([place]).   She has a past surgical history that includes Lipoma excision; Breast surgery; Cardiac catheterization (06/03/2005); and Coronary artery bypass graft (2007).   Her family history includes Arthritis in her sister, sister, and sister; Cancer (age of onset: 35) in her sister; Diabetes in her brother, brother, brother, and father; Diverticulitis in her sister; Hemachromatosis in her brother, sister, sister, and sister; Hypertension in her father and mother; Lupus in her sister; Neuropathy in her mother; Stroke in her father and mother.She reports that she has never smoked. She has never used smokeless tobacco. She reports that she does not drink alcohol or use drugs.    ROS Review of Systems  Constitutional: Negative.   HENT: Negative.   Eyes: Negative for visual disturbance.  Respiratory: Negative for shortness of breath.   Cardiovascular: Negative for chest pain.  Gastrointestinal: Negative for abdominal pain.  Musculoskeletal: Positive for arthralgias.    Objective:  BP 126/65   Pulse 68   Ht 5' 3"  (1.6 m)   Wt 190 lb 4 oz (86.3 kg)   BMI 33.70 kg/m   BP Readings from Last 3 Encounters:  07/19/17 126/65  06/18/17 133/70  05/11/17 128/79    Wt Readings from  Last 3 Encounters:  07/19/17 190 lb 4 oz (86.3 kg)  06/18/17 188 lb 8 oz (85.5 kg)  04/29/17 187 lb (84.8 kg)     Physical Exam  Constitutional: She is oriented to person, place, and time. She appears well-developed and well-nourished. No distress.  Cardiovascular: Normal rate and regular rhythm.  No murmur heard. Pulmonary/Chest: Breath sounds normal.  Musculoskeletal: She exhibits tenderness (At the superior aspect of the right shoulder.  There is palpable tenderness and spasm along the supraspinatus and the margin of the trapezius at the superior aspect of the scapula). She exhibits no  deformity.  Neurological: She is alert and oriented to person, place, and time. No sensory deficit. She exhibits abnormal muscle tone (At the right shoulder). Coordination normal.  Skin: Skin is warm and dry. Capillary refill takes less than 2 seconds. No rash noted. No erythema.  Psychiatric: She has a normal mood and affect. Her behavior is normal.      Assessment & Plan:   Junie was seen today for shoulder pain.  Diagnoses and all orders for this visit:  Rotator cuff impingement syndrome of right shoulder  Other orders -     meloxicam (MOBIC) 7.5 MG tablet; Take 1 tablet (7.5 mg total) by mouth daily. -     pregabalin (LYRICA) 75 MG capsule; Take 1 capsule (75 mg total) by mouth 2 (two) times daily.       I have discontinued Ferrah E. Littlepage's pregabalin. I am also having her start on meloxicam and pregabalin. Additionally, I am having her maintain her aspirin, fluticasone, VOLTAREN, omega-3 acid ethyl esters, ACCU-CHEK FASTCLIX LANCET, PRALUENT, allopurinol, colchicine, glucose blood, sitaGLIPtin, metoprolol tartrate, olmesartan, triamterene-hydrochlorothiazide, and Vitamin D (Ergocalciferol).  Allergies as of 07/19/2017      Reactions   Cymbalta [duloxetine Hcl] Other (See Comments)   Profuse sweating   Diclofenac Diarrhea   Crestor [rosuvastatin] Other (See Comments)   Muscle aches on 5 mg daily and 10 mg twice weekly   Lipitor [atorvastatin] Other (See Comments)   Muscle aches   Livalo [pitavastatin] Other (See Comments)      Sulfa Antibiotics Rash   Zetia [ezetimibe] Other (See Comments)   Muscle aches   Zocor [simvastatin] Other (See Comments)   Muscle aches       Medication List        Accurate as of 07/19/17  6:14 PM. Always use your most recent med list.          ACCU-CHEK FASTCLIX LANCET Kit Use to check Blood Sugars   allopurinol 100 MG tablet Commonly known as:  ZYLOPRIM TAKE (1) TABLET TWICE A DAY.   aspirin 81 MG tablet Take 81 mg by  mouth daily.   colchicine 0.6 MG tablet TAKE (1) TABLET DAILY AS NEEDED.   fluticasone 50 MCG/ACT nasal spray Commonly known as:  FLONASE SPRAY 1 SPRAY IN EACH NOSTRIL ONCE DAILY.   glucose blood test strip Commonly known as:  ONE TOUCH ULTRA TEST CHECK BLOOD SUGAR 2 TIMES A DAY   meloxicam 7.5 MG tablet Commonly known as:  MOBIC Take 1 tablet (7.5 mg total) by mouth daily.   metoprolol tartrate 25 MG tablet Commonly known as:  LOPRESSOR TAKE (1) TABLET TWICE A DAY.   olmesartan 20 MG tablet Commonly known as:  BENICAR Take 1 tablet (20 mg total) by mouth daily.   omega-3 acid ethyl esters 1 g capsule Commonly known as:  LOVAZA TAKE (2) CAPSULES TWICE DAILY.   PRALUENT 75 MG/ML  Sopn Generic drug:  Alirocumab Inject 75 mg under the skin every 14 (fourteen) days   pregabalin 75 MG capsule Commonly known as:  LYRICA Take 1 capsule (75 mg total) by mouth 2 (two) times daily.   sitaGLIPtin 100 MG tablet Commonly known as:  JANUVIA Take 1 tablet (100 mg total) by mouth daily.   triamterene-hydrochlorothiazide 37.5-25 MG capsule Commonly known as:  DYAZIDE TAKE (1) CAPSULE DAILY   Vitamin D (Ergocalciferol) 50000 units Caps capsule Commonly known as:  DRISDOL Take 1 capsule (50,000 Units total) by mouth 2 (two) times a week.   VOLTAREN 1 % Gel Generic drug:  diclofenac sodium APPLY 4 GRAMS TO AFFECTED AREA(S) 4 TIMES A DAY AS NEEDED        Follow-up: Return in about 2 months (around 09/18/2017).  Claretta Fraise, M.D.

## 2017-07-21 DIAGNOSIS — Z85820 Personal history of malignant melanoma of skin: Secondary | ICD-10-CM | POA: Diagnosis not present

## 2017-07-21 DIAGNOSIS — Z85828 Personal history of other malignant neoplasm of skin: Secondary | ICD-10-CM | POA: Diagnosis not present

## 2017-07-21 DIAGNOSIS — L821 Other seborrheic keratosis: Secondary | ICD-10-CM | POA: Diagnosis not present

## 2017-07-21 DIAGNOSIS — D2271 Melanocytic nevi of right lower limb, including hip: Secondary | ICD-10-CM | POA: Diagnosis not present

## 2017-07-21 DIAGNOSIS — L638 Other alopecia areata: Secondary | ICD-10-CM | POA: Diagnosis not present

## 2017-07-23 ENCOUNTER — Telehealth: Payer: Self-pay | Admitting: Family Medicine

## 2017-07-28 ENCOUNTER — Encounter: Payer: Self-pay | Admitting: Hematology & Oncology

## 2017-07-28 ENCOUNTER — Inpatient Hospital Stay: Payer: Medicare Other

## 2017-07-28 ENCOUNTER — Other Ambulatory Visit: Payer: Self-pay

## 2017-07-28 ENCOUNTER — Inpatient Hospital Stay: Payer: Medicare Other | Attending: Hematology & Oncology | Admitting: Hematology & Oncology

## 2017-07-28 DIAGNOSIS — Z79899 Other long term (current) drug therapy: Secondary | ICD-10-CM

## 2017-07-28 DIAGNOSIS — Z7982 Long term (current) use of aspirin: Secondary | ICD-10-CM

## 2017-07-28 LAB — CBC WITH DIFFERENTIAL (CANCER CENTER ONLY)
BASOS PCT: 0 %
Basophils Absolute: 0 10*3/uL (ref 0.0–0.1)
EOS ABS: 0.3 10*3/uL (ref 0.0–0.5)
Eosinophils Relative: 3 %
HCT: 41.7 % (ref 34.8–46.6)
HEMOGLOBIN: 14.4 g/dL (ref 11.6–15.9)
LYMPHS ABS: 4 10*3/uL — AB (ref 0.9–3.3)
Lymphocytes Relative: 39 %
MCH: 31.5 pg (ref 26.0–34.0)
MCHC: 34.5 g/dL (ref 32.0–36.0)
MCV: 91.2 fL (ref 81.0–101.0)
Monocytes Absolute: 0.6 10*3/uL (ref 0.1–0.9)
Monocytes Relative: 6 %
NEUTROS PCT: 52 %
Neutro Abs: 5.4 10*3/uL (ref 1.5–6.5)
PLATELETS: 153 10*3/uL (ref 145–400)
RBC: 4.57 MIL/uL (ref 3.70–5.32)
RDW: 13 % (ref 11.1–15.7)
WBC: 10.3 10*3/uL — AB (ref 3.9–10.0)

## 2017-07-28 LAB — CMP (CANCER CENTER ONLY)
ALK PHOS: 69 U/L (ref 26–84)
ALT: 32 U/L (ref 10–47)
AST: 28 U/L (ref 11–38)
Albumin: 3.8 g/dL (ref 3.5–5.0)
Anion gap: 10 (ref 5–15)
BUN: 17 mg/dL (ref 7–22)
CO2: 27 mmol/L (ref 18–33)
Calcium: 10.4 mg/dL — ABNORMAL HIGH (ref 8.0–10.3)
Chloride: 105 mmol/L (ref 98–108)
Creatinine: 1.5 mg/dL — ABNORMAL HIGH (ref 0.60–1.20)
GLUCOSE: 206 mg/dL — AB (ref 73–118)
Potassium: 4.1 mmol/L (ref 3.3–4.7)
SODIUM: 142 mmol/L (ref 128–145)
TOTAL PROTEIN: 7.3 g/dL (ref 6.4–8.1)
Total Bilirubin: 0.6 mg/dL (ref 0.2–1.6)

## 2017-07-28 NOTE — Progress Notes (Signed)
Hematology and Oncology Follow Up Visit  Carmen [name] [id number] February 03, 1941 77 y.o. 07/28/2017   Principle Diagnosis:  Hemochromatosis (double heterozygote for C282Y and S65C mutations).  Current Therapy:    Phlebotomy to maintain ferritin less than 100     Interim History:  Ms.  Carmen [name] is back for followup.  She is managing pretty well.  I thought that she would have a tough time over the [employer] basketball tournament.  Her late husband was a huge Public librarian.  I thought that she would have a hard time whenever [place] would play.  Actually, when she really did well over the tournament.  We did phlebotomize her back in February.  Her ferritin was 118 with iron saturation of 47%.  She has had no issues with fever.  She has had high blood sugars.  She has a lot of arthritic problems right now.  She has bilateral shoulder difficulties.  She is not able to really move her shoulders all that well.  She does not have an orthopedist.  I recommended Dr. Levora Dredge of [facility].  She will think about this.  It sounds like she might go to a chiropractor first.  She has had no problems with bowels or bladder.  She has had no rashes.  She has had no leg swelling.    Overall, I say her performance status is ECOG 1.    Medications:  Current Outpatient Medications:  .  allopurinol (ZYLOPRIM) 100 MG tablet, TAKE (1) TABLET TWICE A DAY., Disp: 180 tablet, Rfl: 1 .  aspirin 81 MG tablet, Take 81 mg by mouth daily., Disp: , Rfl:  .  colchicine 0.6 MG tablet, TAKE (1) TABLET DAILY AS NEEDED., Disp: 30 tablet, Rfl: 1 .  fluticasone (FLONASE) 50 MCG/ACT nasal spray, SPRAY 1 SPRAY IN EACH NOSTRIL ONCE DAILY., Disp: 16 g, Rfl: 4 .  glucose blood (ONE TOUCH ULTRA TEST) test strip, CHECK BLOOD SUGAR 2 TIMES A DAY, Disp: 100 each, Rfl: prn .  Lancets Misc. (ACCU-CHEK FASTCLIX LANCET) KIT, Use to check Blood Sugars, Disp: 1 kit, Rfl: 0 .  meloxicam (MOBIC) 7.5 MG tablet, Take 1 tablet  (7.5 mg total) by mouth daily., Disp: 30 tablet, Rfl: 5 .  metoprolol tartrate (LOPRESSOR) 25 MG tablet, TAKE (1) TABLET TWICE A DAY., Disp: 180 tablet, Rfl: 1 .  olmesartan (BENICAR) 20 MG tablet, Take 1 tablet (20 mg total) by mouth daily., Disp: 90 tablet, Rfl: 1 .  omega-3 acid ethyl esters (LOVAZA) 1 g capsule, TAKE (2) CAPSULES TWICE DAILY., Disp: 360 capsule, Rfl: 1 .  PRALUENT 75 MG/ML SOPN, Inject 75 mg under the skin every 14 (fourteen) days, Disp: 2 pen, Rfl: 5 .  pregabalin (LYRICA) 75 MG capsule, Take 1 capsule (75 mg total) by mouth 2 (two) times daily., Disp: 60 capsule, Rfl: 2 .  sitaGLIPtin (JANUVIA) 100 MG tablet, Take 1 tablet (100 mg total) by mouth daily., Disp: 90 tablet, Rfl: 1 .  triamterene-hydrochlorothiazide (DYAZIDE) 37.5-25 MG capsule, TAKE (1) CAPSULE DAILY, Disp: 90 capsule, Rfl: 1 .  Vitamin D, Ergocalciferol, (DRISDOL) 50000 units CAPS capsule, Take 1 capsule (50,000 Units total) by mouth 2 (two) times a week., Disp: 26 capsule, Rfl: 3 .  VOLTAREN 1 % GEL, APPLY 4 GRAMS TO AFFECTED AREA(S) 4 TIMES A DAY AS NEEDED, Disp: 100 g, Rfl: 2  Allergies:  Allergies  Allergen Reactions  . Cymbalta [Duloxetine Hcl] Other (See Comments)    Profuse sweating  . Diclofenac Diarrhea  .  Crestor [Rosuvastatin] Other (See Comments)    Muscle aches on 5 mg daily and 10 mg twice weekly  . Lipitor [Atorvastatin] Other (See Comments)    Muscle aches  . Livalo [Pitavastatin] Other (See Comments)       . Sulfa Antibiotics Rash  . Zetia [Ezetimibe] Other (See Comments)    Muscle aches  . Zocor [Simvastatin] Other (See Comments)    Muscle aches     Past Medical History, Surgical history, Social history, and Family History were reviewed and updated.  Review of Systems: Review of Systems  Constitutional: Negative.   HENT: Negative.   Eyes: Negative.   Respiratory: Negative.   Cardiovascular: Negative.   Gastrointestinal: Negative.   Genitourinary: Negative.    Musculoskeletal: Negative.   Skin: Negative.   Neurological: Negative.   Endo/Heme/Allergies: Negative.   Psychiatric/Behavioral: Negative.     Physical Exam:  weight is 193 lb (87.5 kg). Her oral temperature is 98.2 F (36.8 C). Her blood pressure is 134/57 (abnormal) and her pulse is 67. Her respiration is 20 and oxygen saturation is 97%.   Physical Exam  Constitutional: She is oriented to person, place, and time.  HENT:  Head: Normocephalic and atraumatic.  Mouth/Throat: Oropharynx is clear and moist.  Eyes: Pupils are equal, round, and reactive to light. EOM are normal.  Neck: Normal range of motion.  Cardiovascular: Normal rate, regular rhythm and normal heart sounds.  Pulmonary/Chest: Effort normal and breath sounds normal.  Abdominal: Soft. Bowel sounds are normal.  Musculoskeletal: Normal range of motion. She exhibits no edema, tenderness or deformity.  Lymphadenopathy:    She has no cervical adenopathy.  Neurological: She is alert and oriented to person, place, and time.  Skin: Skin is warm and dry. No rash noted. No erythema.  Psychiatric: She has a normal mood and affect. Her behavior is normal. Judgment and thought content normal.  Vitals reviewed.    Lab Results  Component Value Date   WBC 10.3 (H) 07/28/2017   HGB 14.4 07/28/2017   HCT 41.7 07/28/2017   MCV 91.2 07/28/2017   PLT 153 07/28/2017     Chemistry      Component Value Date/Time   NA 142 07/28/2017 1440   NA 139 06/18/2017 1608   NA 147 (H) 01/22/2017 1451   NA 140 12/31/2015 1052   K 4.1 07/28/2017 1440   K 4.9 (H) 01/22/2017 1451   K 4.2 12/31/2015 1052   CL 105 07/28/2017 1440   CL 106 01/22/2017 1451   CO2 27 07/28/2017 1440   CO2 29 01/22/2017 1451   CO2 23 12/31/2015 1052   BUN 17 07/28/2017 1440   BUN 22 06/18/2017 1608   BUN 23 (H) 01/22/2017 1451   BUN 21.4 12/31/2015 1052   CREATININE 1.50 (H) 07/28/2017 1440   CREATININE 1.6 (H) 01/22/2017 1451   CREATININE 1.4 (H)  12/31/2015 1052      Component Value Date/Time   CALCIUM 10.4 (H) 07/28/2017 1440   CALCIUM 10.3 01/22/2017 1451   CALCIUM 9.7 12/31/2015 1052   ALKPHOS 69 07/28/2017 1440   ALKPHOS 65 01/22/2017 1451   ALKPHOS 87 12/31/2015 1052   AST 28 07/28/2017 1440   AST 31 12/31/2015 1052   ALT 32 07/28/2017 1440   ALT 25 01/22/2017 1451   ALT 36 12/31/2015 1052   BILITOT 0.6 07/28/2017 1440   BILITOT 0.42 12/31/2015 1052        Impression and Plan: Ms. Mcglade is 77 year old white female with hemachromatosis.  She is a double heterozygote.  We will have to see what her iron studies look like.  Hopefully, she will not need to be phlebotomized.  We will plan to get her back to see [name] in another 3 months.  Volanda Napoleon, MD 5/8/20193:42 PM

## 2017-07-29 ENCOUNTER — Encounter: Payer: Self-pay | Admitting: *Deleted

## 2017-07-29 DIAGNOSIS — M9902 Segmental and somatic dysfunction of thoracic region: Secondary | ICD-10-CM | POA: Diagnosis not present

## 2017-07-29 DIAGNOSIS — M9903 Segmental and somatic dysfunction of lumbar region: Secondary | ICD-10-CM | POA: Diagnosis not present

## 2017-07-29 DIAGNOSIS — M9901 Segmental and somatic dysfunction of cervical region: Secondary | ICD-10-CM | POA: Diagnosis not present

## 2017-07-29 DIAGNOSIS — M5412 Radiculopathy, cervical region: Secondary | ICD-10-CM | POA: Diagnosis not present

## 2017-07-29 LAB — IRON AND TIBC
IRON: 79 ug/dL (ref 41–142)
Saturation Ratios: 28 % (ref 21–57)
TIBC: 284 ug/dL (ref 236–444)
UIBC: 205 ug/dL

## 2017-07-29 LAB — FERRITIN: FERRITIN: 52 ng/mL (ref 9–269)

## 2017-08-02 DIAGNOSIS — M9903 Segmental and somatic dysfunction of lumbar region: Secondary | ICD-10-CM | POA: Diagnosis not present

## 2017-08-02 DIAGNOSIS — M9901 Segmental and somatic dysfunction of cervical region: Secondary | ICD-10-CM | POA: Diagnosis not present

## 2017-08-02 DIAGNOSIS — M5412 Radiculopathy, cervical region: Secondary | ICD-10-CM | POA: Diagnosis not present

## 2017-08-02 DIAGNOSIS — M9902 Segmental and somatic dysfunction of thoracic region: Secondary | ICD-10-CM | POA: Diagnosis not present

## 2017-08-04 DIAGNOSIS — M9901 Segmental and somatic dysfunction of cervical region: Secondary | ICD-10-CM | POA: Diagnosis not present

## 2017-08-04 DIAGNOSIS — M9902 Segmental and somatic dysfunction of thoracic region: Secondary | ICD-10-CM | POA: Diagnosis not present

## 2017-08-04 DIAGNOSIS — M9903 Segmental and somatic dysfunction of lumbar region: Secondary | ICD-10-CM | POA: Diagnosis not present

## 2017-08-04 DIAGNOSIS — M5412 Radiculopathy, cervical region: Secondary | ICD-10-CM | POA: Diagnosis not present

## 2017-08-05 DIAGNOSIS — M5412 Radiculopathy, cervical region: Secondary | ICD-10-CM | POA: Diagnosis not present

## 2017-08-05 DIAGNOSIS — M9902 Segmental and somatic dysfunction of thoracic region: Secondary | ICD-10-CM | POA: Diagnosis not present

## 2017-08-05 DIAGNOSIS — M9903 Segmental and somatic dysfunction of lumbar region: Secondary | ICD-10-CM | POA: Diagnosis not present

## 2017-08-05 DIAGNOSIS — M9901 Segmental and somatic dysfunction of cervical region: Secondary | ICD-10-CM | POA: Diagnosis not present

## 2017-08-09 DIAGNOSIS — M5412 Radiculopathy, cervical region: Secondary | ICD-10-CM | POA: Diagnosis not present

## 2017-08-09 DIAGNOSIS — M9901 Segmental and somatic dysfunction of cervical region: Secondary | ICD-10-CM | POA: Diagnosis not present

## 2017-08-09 DIAGNOSIS — M9902 Segmental and somatic dysfunction of thoracic region: Secondary | ICD-10-CM | POA: Diagnosis not present

## 2017-08-09 DIAGNOSIS — M9903 Segmental and somatic dysfunction of lumbar region: Secondary | ICD-10-CM | POA: Diagnosis not present

## 2017-08-10 DIAGNOSIS — Z1231 Encounter for screening mammogram for malignant neoplasm of breast: Secondary | ICD-10-CM | POA: Diagnosis not present

## 2017-08-10 DIAGNOSIS — R102 Pelvic and perineal pain: Secondary | ICD-10-CM | POA: Diagnosis not present

## 2017-08-10 DIAGNOSIS — N950 Postmenopausal bleeding: Secondary | ICD-10-CM | POA: Diagnosis not present

## 2017-08-11 DIAGNOSIS — M9903 Segmental and somatic dysfunction of lumbar region: Secondary | ICD-10-CM | POA: Diagnosis not present

## 2017-08-11 DIAGNOSIS — M9901 Segmental and somatic dysfunction of cervical region: Secondary | ICD-10-CM | POA: Diagnosis not present

## 2017-08-11 DIAGNOSIS — M9902 Segmental and somatic dysfunction of thoracic region: Secondary | ICD-10-CM | POA: Diagnosis not present

## 2017-08-11 DIAGNOSIS — M5412 Radiculopathy, cervical region: Secondary | ICD-10-CM | POA: Diagnosis not present

## 2017-08-12 ENCOUNTER — Encounter: Payer: Self-pay | Admitting: Physician Assistant

## 2017-08-12 ENCOUNTER — Ambulatory Visit (INDEPENDENT_AMBULATORY_CARE_PROVIDER_SITE_OTHER): Payer: Medicare Other | Admitting: Physician Assistant

## 2017-08-12 VITALS — BP 121/65 | HR 77 | Temp 97.7°F | Ht 63.0 in | Wt 193.0 lb

## 2017-08-12 DIAGNOSIS — M9902 Segmental and somatic dysfunction of thoracic region: Secondary | ICD-10-CM | POA: Diagnosis not present

## 2017-08-12 DIAGNOSIS — S70262A Insect bite (nonvenomous), left hip, initial encounter: Secondary | ICD-10-CM | POA: Diagnosis not present

## 2017-08-12 DIAGNOSIS — M9903 Segmental and somatic dysfunction of lumbar region: Secondary | ICD-10-CM | POA: Diagnosis not present

## 2017-08-12 DIAGNOSIS — M9901 Segmental and somatic dysfunction of cervical region: Secondary | ICD-10-CM | POA: Diagnosis not present

## 2017-08-12 DIAGNOSIS — W57XXXA Bitten or stung by nonvenomous insect and other nonvenomous arthropods, initial encounter: Secondary | ICD-10-CM | POA: Diagnosis not present

## 2017-08-12 DIAGNOSIS — M5412 Radiculopathy, cervical region: Secondary | ICD-10-CM | POA: Diagnosis not present

## 2017-08-12 DIAGNOSIS — S50361A Insect bite (nonvenomous) of right elbow, initial encounter: Secondary | ICD-10-CM | POA: Diagnosis not present

## 2017-08-12 HISTORY — DX: Bitten or stung by nonvenomous insect and other nonvenomous arthropods, initial encounter: W57.XXXA

## 2017-08-12 MED ORDER — DOXYCYCLINE HYCLATE 100 MG PO TABS: 100.0000 mg | ORAL_TABLET | Freq: Two times a day (BID) | ORAL | 0 refills | Status: DC

## 2017-08-12 NOTE — Patient Instructions (Signed)
Dr Onnie Graham  Shoulder  [phone]  [facility]

## 2017-08-13 DIAGNOSIS — N6324 Unspecified lump in the left breast, lower inner quadrant: Secondary | ICD-10-CM | POA: Diagnosis not present

## 2017-08-13 DIAGNOSIS — R922 Inconclusive mammogram: Secondary | ICD-10-CM | POA: Diagnosis not present

## 2017-08-17 NOTE — Progress Notes (Signed)
BP 121/65   Pulse 77   Temp 97.7 F (36.5 C) (Oral)   Ht _0  (1.6 m)   Wt 193 lb (87.5 kg)   BMI 34.19 kg/m    Subjective:    Patient ID: Carmen [name], female    DOB: 1940/04/29, 77 y.o.   MRN: [id number]  HPI: Carmen [name] is a 77 y.o. female presenting on 08/12/2017 for Tick Removal (2 in last 6 days one on right elbow healing well, one found in left hip area not sure if she got all of it this morning)  She has had redness around the tick bites. Reports some itching, no other rash. She denies fever, chills or headache. She thinks that the tick could have been there up to 2 days.  Past Medical History:  Diagnosis Date  . Anemia   . Arthritis   . CAD (coronary artery disease)   . Cancer ([place]) 11/2015   melanoma right upper arm  . Diabetes mellitus without complication ([place])   . Diverticulosis   . Gout   . Hemochromatosis, hereditary ([place]) 11/24/2012  . Hyperlipidemia   . Hypertension   . Neuromuscular disorder ([place])    peripheral neuropathy   Relevant past medical, surgical, family and social history reviewed and updated as indicated. Interim medical history since our last visit reviewed. Allergies and medications reviewed and updated. DATA REVIEWED: CHART IN EPIC  Family History reviewed for pertinent findings.  Review of Systems  Constitutional: Negative.   HENT: Negative.   Eyes: Negative.   Respiratory: Negative.   Gastrointestinal: Negative.   Genitourinary: Negative.   Skin: Positive for color change and wound.    Allergies as of 08/12/2017      Reactions   Cymbalta [duloxetine Hcl] Other (See Comments)   Profuse sweating   Diclofenac Diarrhea   Crestor [rosuvastatin] Other (See Comments)   Muscle aches on 5 mg daily and 10 mg twice weekly   Lipitor [atorvastatin] Other (See Comments)   Muscle aches   Livalo [pitavastatin] Other (See Comments)      Sulfa Antibiotics Rash   Zetia [ezetimibe] Other (See Comments)   Muscle aches   Zocor  [simvastatin] Other (See Comments)   Muscle aches       Medication List        Accurate as of 08/12/17 11:59 PM. Always use your most recent med list.          ACCU-CHEK FASTCLIX LANCET Kit Use to check Blood Sugars   allopurinol 100 MG tablet Commonly known as:  ZYLOPRIM TAKE (1) TABLET TWICE A DAY.   aspirin 81 MG tablet Take 81 mg by mouth daily.   colchicine 0.6 MG tablet TAKE (1) TABLET DAILY AS NEEDED.   doxycycline 100 MG tablet Commonly known as:  VIBRA-TABS Take 1 tablet (100 mg total) by mouth 2 (two) times daily. 1 po bid   fluticasone 50 MCG/ACT nasal spray Commonly known as:  FLONASE SPRAY 1 SPRAY IN EACH NOSTRIL ONCE DAILY.   glucose blood test strip Commonly known as:  ONE TOUCH ULTRA TEST CHECK BLOOD SUGAR 2 TIMES A DAY   meloxicam 7.5 MG tablet Commonly known as:  MOBIC Take 1 tablet (7.5 mg total) by mouth daily.   metoprolol tartrate 25 MG tablet Commonly known as:  LOPRESSOR TAKE (1) TABLET TWICE A DAY.   olmesartan 20 MG tablet Commonly known as:  BENICAR Take 1 tablet (20 mg total) by mouth daily.   omega-3 acid ethyl  esters 1 g capsule Commonly known as:  LOVAZA TAKE (2) CAPSULES TWICE DAILY.   PRALUENT 75 MG/ML Sopn Generic drug:  Alirocumab Inject 75 mg under the skin every 14 (fourteen) days   pregabalin 75 MG capsule Commonly known as:  LYRICA Take 1 capsule (75 mg total) by mouth 2 (two) times daily.   sitaGLIPtin 100 MG tablet Commonly known as:  JANUVIA Take 1 tablet (100 mg total) by mouth daily.   triamterene-hydrochlorothiazide 37.5-25 MG capsule Commonly known as:  DYAZIDE TAKE (1) CAPSULE DAILY   Vitamin D (Ergocalciferol) 50000 units Caps capsule Commonly known as:  DRISDOL Take 1 capsule (50,000 Units total) by mouth 2 (two) times a week.   VOLTAREN 1 % Gel Generic drug:  diclofenac sodium APPLY 4 GRAMS TO AFFECTED AREA(S) 4 TIMES A DAY AS NEEDED          Objective:    BP 121/65   Pulse 77   Temp  97.7 F (36.5 C) (Oral)   Ht _0  (1.6 m)   Wt 193 lb (87.5 kg)   BMI 34.19 kg/m   Allergies  Allergen Reactions  . Cymbalta [Duloxetine Hcl] Other (See Comments)    Profuse sweating  . Diclofenac Diarrhea  . Crestor [Rosuvastatin] Other (See Comments)    Muscle aches on 5 mg daily and 10 mg twice weekly  . Lipitor [Atorvastatin] Other (See Comments)    Muscle aches  . Livalo [Pitavastatin] Other (See Comments)       . Sulfa Antibiotics Rash  . Zetia [Ezetimibe] Other (See Comments)    Muscle aches  . Zocor [Simvastatin] Other (See Comments)    Muscle aches     Wt Readings from Last 3 Encounters:  08/12/17 193 lb (87.5 kg)  07/28/17 193 lb (87.5 kg)  07/19/17 190 lb 4 oz (86.3 kg)    Physical Exam  Constitutional: She is oriented to person, place, and time. She appears well-developed and well-nourished.  HENT:  Head: Normocephalic and atraumatic.  Eyes: Pupils are equal, round, and reactive to light. Conjunctivae and EOM are normal.  Cardiovascular: Normal rate, regular rhythm, normal heart sounds and intact distal pulses.  Pulmonary/Chest: Effort normal and breath sounds normal.  Abdominal: Soft. Bowel sounds are normal.  Neurological: She is alert and oriented to person, place, and time. She has normal reflexes.  Skin: Skin is warm and dry. Lesion and rash noted. Rash is maculopapular. There is erythema.     Psychiatric: She has a normal mood and affect. Her behavior is normal. Judgment and thought content normal.        Assessment & Plan:   1. Tick bite, initial encounter - doxycycline (VIBRA-TABS) 100 MG tablet; Take 1 tablet (100 mg total) by mouth 2 (two) times daily. 1 po bid  Dispense: 20 tablet; Refill: 0   Continue all other maintenance medications as listed above.  Follow up plan: No follow-ups on file.  Educational handout given for [place] PA-C [place] [address]  [place], [place]  [address] [phone]   08/17/2017, 9:42 AM

## 2017-08-18 DIAGNOSIS — M7502 Adhesive capsulitis of left shoulder: Secondary | ICD-10-CM | POA: Diagnosis not present

## 2017-08-18 DIAGNOSIS — M7501 Adhesive capsulitis of right shoulder: Secondary | ICD-10-CM | POA: Diagnosis not present

## 2017-08-19 ENCOUNTER — Ambulatory Visit (INDEPENDENT_AMBULATORY_CARE_PROVIDER_SITE_OTHER): Payer: Medicare Other | Admitting: Family

## 2017-08-19 VITALS — BP 168/79 | HR 79 | Temp 97.9°F | Wt 192.4 lb

## 2017-08-19 DIAGNOSIS — I10 Essential (primary) hypertension: Secondary | ICD-10-CM | POA: Diagnosis not present

## 2017-08-19 DIAGNOSIS — E1140 Type 2 diabetes mellitus with diabetic neuropathy, unspecified: Secondary | ICD-10-CM | POA: Diagnosis not present

## 2017-08-19 DIAGNOSIS — M1990 Unspecified osteoarthritis, unspecified site: Secondary | ICD-10-CM

## 2017-08-19 DIAGNOSIS — R739 Hyperglycemia, unspecified: Secondary | ICD-10-CM | POA: Diagnosis not present

## 2017-08-19 DIAGNOSIS — E1159 Type 2 diabetes mellitus with other circulatory complications: Secondary | ICD-10-CM

## 2017-08-19 MED ORDER — BASAGLAR KWIKPEN 100 UNIT/ML ~~LOC~~ SOPN: 5.0000 [IU] | PEN_INJECTOR | Freq: Every day | SUBCUTANEOUS | 0 refills | Status: DC

## 2017-08-19 NOTE — Progress Notes (Signed)
   Subjective:    Patient ID: Carmen [name], female    DOB: September 26, 1940, 77 y.o.   MRN: [id number]  Chief Complaint  Patient presents with  . Hypertension    steroid inj in R shoulder yesterday  . Hyperglycemia    BS this 448, now 341    HPI Pt presents to the office today with hyperglycemia and hypertension. Pt states she was seen by Ortho yesterday and was given a steroid injection yesterday in her right shoulder. States her BS always are elevated after her steroid injections. States her glucose this morning was 448 and today in the clinic it is 341.   She is currently taking Januvia daily.    Review of Systems  Musculoskeletal: Positive for arthralgias.  All other systems reviewed and are negative.      Objective:   Physical Exam  Constitutional: She is oriented to person, place, and time. She appears well-developed and well-nourished. No distress.  HENT:  Head: Normocephalic and atraumatic.  Right Ear: External ear normal.  Left Ear: External ear normal.  Mouth/Throat: Oropharynx is clear and moist.  Facial flushing  Eyes: Pupils are equal, round, and reactive to light.  Neck: Normal range of motion. Neck supple. No thyromegaly present.  Cardiovascular: Normal rate, regular rhythm, normal heart sounds and intact distal pulses.  No murmur heard. Pulmonary/Chest: Effort normal and breath sounds normal. No respiratory distress. She has no wheezes.  Abdominal: Soft. Bowel sounds are normal. She exhibits no distension. There is no tenderness.  Musculoskeletal: Normal range of motion. She exhibits no edema or tenderness.  Neurological: She is alert and oriented to person, place, and time. She has normal reflexes. No cranial nerve deficit.  Skin: Skin is warm and dry.  Psychiatric: She has a normal mood and affect. Her behavior is normal. Judgment and thought content normal.  Vitals reviewed.    BP (!) 168/79 (BP Location: Left Arm, Patient Position: Sitting, Cuff Size:  Normal)   Pulse 79   Temp 97.9 F (36.6 C) (Oral)   Wt 192 lb 6.4 oz (87.3 kg)   BMI 34.08 kg/m      Assessment & Plan:  Carmen [name] comes in today with chief complaint of Hypertension (steroid inj in R shoulder yesterday) and Hyperglycemia (BS this 448, now 341)   Diagnosis and orders addressed:  1. Controlled type 2 diabetes mellitus with diabetic neuropathy, without long-term current use of insulin ([place])  2. Arthritis  3. Hypertension associated with diabetes ([place])  4. Hyperglycemia Will give sample of Basaglar and start her for 5 units every night until her fasting BS in the morning are less than 200 Pt is planning on doing other shoulder next month and discussed to save sample after that steroid injection  Strict low carb diet - Insulin Glargine (BASAGLAR KWIKPEN) 100 UNIT/ML SOPN; Inject 0.05 mLs (5 Units total) into the skin at bedtime. Until Blood glucose is less than 200  Dispense: 1 pen; Refill: 0   Keep follow up with PCP Last A1C was at goal    Carmen Dun, FNP

## 2017-08-19 NOTE — Patient Instructions (Signed)
Hyperglycemia Hyperglycemia occurs when the level of sugar (glucose) in the blood is too high. Glucose is a type of sugar that provides the body's main source of energy. Certain hormones (insulin and glucagon) control the level of glucose in the blood. Insulin lowers blood glucose, and glucagon increases blood glucose. Hyperglycemia can result from having too little insulin in the bloodstream, or from the body not responding normally to insulin. Hyperglycemia occurs most often in people who have diabetes (diabetes mellitus), but it can happen in people who do not have diabetes. It can develop quickly, and it can be life-threatening if it causes you to become severely dehydrated (diabetic ketoacidosis or hyperglycemic hyperosmolar state). Severe hyperglycemia is a medical emergency. What are the causes? If you have diabetes, hyperglycemia may be caused by:  Diabetes medicine.  Medicines that increase blood glucose or affect your diabetes control.  Not eating enough, or not eating often enough.  Changes in physical activity level.  Being sick or having an infection.  If you have prediabetes or undiagnosed diabetes:  Hyperglycemia may be caused by those conditions.  If you do not have diabetes, hyperglycemia may be caused by:  Certain medicines, including steroid medicines, beta-blockers, epinephrine, and thiazide diuretics.  Stress.  Serious illness.  Surgery.  Diseases of the pancreas.  Infection.  What increases the risk? Hyperglycemia is more likely to develop in people who have risk factors for diabetes, such as:  Having a family member with diabetes.  Having a gene for type 1 diabetes that is passed from parent to child (inherited).  Living in an area with cold weather conditions.  Exposure to certain viruses.  Certain conditions in which the body's disease-fighting (immune) system attacks itself (autoimmune disorders).  Being overweight or obese.  Having an  inactive (sedentary) lifestyle.  Having been diagnosed with insulin resistance.  Having a history of prediabetes, gestational diabetes, or polycystic ovarian syndrome (PCOS).  Being of American-Indian, African-American, Hispanic/Latino, or Asian/Pacific Islander descent.  What are the signs or symptoms? Hyperglycemia may not cause any symptoms. If you do have symptoms, they may include early warning signs, such as:  Increased thirst.  Hunger.  Feeling very tired.  Needing to urinate more often than usual.  Blurry vision.  Other symptoms may develop if hyperglycemia gets worse, such as:  Dry mouth.  Loss of appetite.  Fruity-smelling breath.  Weakness.  Unexpected or rapid weight gain or weight loss.  Tingling or numbness in the hands or feet.  Headache.  Skin that does not quickly return to normal after being lightly pinched and released (poor skin turgor).  Abdominal pain.  Cuts or bruises that are slow to heal.  How is this diagnosed? Hyperglycemia is diagnosed with a blood test to measure your blood glucose level. This blood test is usually done while you are having symptoms. Your health care provider may also do a physical exam and review your medical history. You may have more tests to determine the cause of your hyperglycemia, such as:  A fasting blood glucose (FBG) test. You will not be allowed to eat (you will fast) for at least 8 hours before a blood sample is taken.  An A1c (hemoglobin A1c) blood test. This provides information about blood glucose control over the previous 2-3 months.  An oral glucose tolerance test (OGTT). This measures your blood glucose at two times: ? After fasting. This is your baseline blood glucose level. ? Two hours after drinking a beverage that contains glucose.  How is   this treated? Treatment depends on the cause of your hyperglycemia. Treatment may include:  Taking medicine to regulate your blood glucose levels. If you  take insulin or other diabetes medicines, your medicine or dosage may be adjusted.  Lifestyle changes, such as exercising more, eating healthier foods, or losing weight.  Treating an illness or infection, if this caused your hyperglycemia.  Checking your blood glucose more often.  Stopping or reducing steroid medicines, if these caused your hyperglycemia.  If your hyperglycemia becomes severe and it results in hyperglycemic hyperosmolar state, you must be hospitalized and given IV fluids. Follow these instructions at home: General instructions  Take over-the-counter and prescription medicines only as told by your health care provider.  Do not use any products that contain nicotine or tobacco, such as cigarettes and e-cigarettes. If you need help quitting, ask your health care provider.  Limit alcohol intake to no more than 1 drink per day for nonpregnant women and 2 drinks per day for men. One drink equals 12 oz of beer, 5 oz of wine, or 1 oz of hard liquor.  Learn to manage stress. If you need help with this, ask your health care provider.  Keep all follow-up visits as told by your health care provider. This is important. Eating and drinking  Maintain a healthy weight.  Exercise regularly, as directed by your health care provider.  Stay hydrated, especially when you exercise, get sick, or spend time in hot temperatures.  Eat healthy foods, such as: ? Lean proteins. ? Complex carbohydrates. ? Fresh fruits and vegetables. ? Low-fat dairy products. ? Healthy fats.  Drink enough fluid to keep your urine clear or pale yellow. If you have diabetes:   Make sure you know the symptoms of hyperglycemia.  Follow your diabetes management plan, as told by your health care provider. Make sure you: ? Take your insulin and medicines as directed. ? Follow your exercise plan. ? Follow your meal plan. Eat on time, and do not skip meals. ? Check your blood glucose as often as directed.  Make sure to check your blood glucose before and after exercise. If you exercise longer or in a different way than usual, check your blood glucose more often. ? Follow your sick day plan whenever you cannot eat or drink normally. Make this plan in advance with your health care provider.  Share your diabetes management plan with people in your workplace, school, and household.  Check your urine for ketones when you are ill and as told by your health care provider.  Carry a medical alert card or wear medical alert jewelry. Contact a health care provider if:  Your blood glucose is at or above 240 mg/dL (13.3 mmol/L) for 2 days in a row.  You have problems keeping your blood glucose in your target range.  You have frequent episodes of hyperglycemia. Get help right away if:  You have difficulty breathing.  You have a change in how you think, feel, or act (mental status).  You have nausea or vomiting that does not go away. These symptoms may represent a serious problem that is an emergency. Do not wait to see if the symptoms will go away. Get medical help right away. Call your local emergency services (911 in the [place] Do not drive yourself to the hospital. Summary  Hyperglycemia occurs when the level of sugar (glucose) in the blood is too high.  Hyperglycemia is diagnosed with a blood test to measure your blood glucose level. This blood   test is usually done while you are having symptoms. Your health care provider may also do a physical exam and review your medical history.  If you have diabetes, follow your diabetes management plan as told by your health care provider.  Contact your health care provider if you have problems keeping your blood glucose in your target range. This information is not intended to replace advice given to you by your health care provider. Make sure you discuss any questions you have with your health care provider. Document Released: 09/02/2000 Document Revised:  11/25/2015 Document Reviewed: 11/25/2015 Elsevier Interactive Patient Education  2018 [employer]  

## 2017-09-01 ENCOUNTER — Telehealth: Payer: Self-pay | Admitting: Cardiology

## 2017-09-01 NOTE — Telephone Encounter (Signed)
Received clearance from [facility] On 09/01/17, Appt 09/10/17 @ 3:30PM.[place]

## 2017-09-06 NOTE — H&P (Signed)
Carmen [name]  DICTATION # [id number] CSN# [id number]   Margarette Asal, MD 09/06/2017 2:09 PM

## 2017-09-10 ENCOUNTER — Ambulatory Visit (INDEPENDENT_AMBULATORY_CARE_PROVIDER_SITE_OTHER): Payer: Medicare Other | Admitting: Cardiology

## 2017-09-10 VITALS — BP 134/66 | HR 58 | Ht 63.0 in | Wt 186.0 lb

## 2017-09-10 DIAGNOSIS — Z951 Presence of aortocoronary bypass graft: Secondary | ICD-10-CM

## 2017-09-10 DIAGNOSIS — Z01818 Encounter for other preprocedural examination: Secondary | ICD-10-CM | POA: Insufficient documentation

## 2017-09-10 DIAGNOSIS — I10 Essential (primary) hypertension: Secondary | ICD-10-CM | POA: Diagnosis not present

## 2017-09-10 DIAGNOSIS — E119 Type 2 diabetes mellitus without complications: Secondary | ICD-10-CM

## 2017-09-10 DIAGNOSIS — E1159 Type 2 diabetes mellitus with other circulatory complications: Secondary | ICD-10-CM

## 2017-09-10 DIAGNOSIS — N183 Chronic kidney disease, stage 3 unspecified: Secondary | ICD-10-CM

## 2017-09-10 DIAGNOSIS — E782 Mixed hyperlipidemia: Secondary | ICD-10-CM

## 2017-09-10 NOTE — Assessment & Plan Note (Signed)
On oral agents 

## 2017-09-10 NOTE — Assessment & Plan Note (Signed)
Controlled.  

## 2017-09-10 NOTE — Assessment & Plan Note (Signed)
Pt seen today for pre op clearance prior to uterine surgery

## 2017-09-10 NOTE — Assessment & Plan Note (Signed)
Statin inn tolerant- she is on PCSK9 LDL 79

## 2017-09-10 NOTE — Patient Instructions (Signed)
Medication Instructions: Your physician recommends that you continue on your current medications as directed. Please refer to the Current Medication list given to you today.  If you need a refill on your cardiac medications before your next appointment, please call your pharmacy.    Follow-Up: Your physician wants you to keep your follow-up on August 20th with Dr. Debara Pickett.  Thank you for choosing [facility] at [facility]!!

## 2017-09-10 NOTE — Assessment & Plan Note (Signed)
SCr 1.5

## 2017-09-10 NOTE — Progress Notes (Signed)
09/10/2017 Carmen [name]   [birth year]  [id number]  Primary Physician Claretta Fraise, MD Primary Cardiologist: Dr Debara Pickett  HPI:  Pleasant 77 y/o female with a history of CAD, s/p CABG in '07, Myoview low risk Nov 2017. She had an EF of 40% after her CABG but her LVF was normal by her Myoview in Nov 2017. Other problems include NIDDM, CRI-3, and dyslipidemia. She is on Pralulent secondary to statin intolerance. This past year she had 4 melanoma's removed (Dr Wilhemina Bonito).  One taken off her back somehow led to a frozen shoulder and she was treated by Dr Onnie Graham. Then a month ago she noted vaginal bleeding. Work up by Dr Matthew Saras revealed a uterine mass and she is scheduled to have surgery in a few weeks. She is in the office today for pre op clearance. She denies any angia and has not used NTG. She can go up two flights if stairs if needed.    Current Outpatient Medications  Medication Sig Dispense Refill  . allopurinol (ZYLOPRIM) 100 MG tablet TAKE (1) TABLET TWICE A DAY. 180 tablet 1  . aspirin 81 MG tablet Take 81 mg by mouth daily.    . colchicine 0.6 MG tablet TAKE (1) TABLET DAILY AS NEEDED. 30 tablet 1  . fluticasone (FLONASE) 50 MCG/ACT nasal spray SPRAY 1 SPRAY IN EACH NOSTRIL ONCE DAILY. 16 g 4  . glucose blood (ONE TOUCH ULTRA TEST) test strip CHECK BLOOD SUGAR 2 TIMES A DAY 100 each prn  . Insulin Glargine (BASAGLAR KWIKPEN) 100 UNIT/ML SOPN Inject 0.05 mLs (5 Units total) into the skin at bedtime. Until Blood glucose is less than 200 1 pen 0  . Lancets Misc. (ACCU-CHEK FASTCLIX LANCET) KIT Use to check Blood Sugars 1 kit 0  . metoprolol tartrate (LOPRESSOR) 25 MG tablet TAKE (1) TABLET TWICE A DAY. 180 tablet 1  . olmesartan (BENICAR) 20 MG tablet Take 1 tablet (20 mg total) by mouth daily. 90 tablet 1  . omega-3 acid ethyl esters (LOVAZA) 1 g capsule TAKE (2) CAPSULES TWICE DAILY. 360 capsule 1  . PRALUENT 75 MG/ML SOPN Inject 75 mg under the skin every 14 (fourteen) days 2 pen 5   . pregabalin (LYRICA) 75 MG capsule Take 1 capsule (75 mg total) by mouth 2 (two) times daily. 60 capsule 2  . sitaGLIPtin (JANUVIA) 100 MG tablet Take 1 tablet (100 mg total) by mouth daily. 90 tablet 1  . triamterene-hydrochlorothiazide (DYAZIDE) 37.5-25 MG capsule TAKE (1) CAPSULE DAILY 90 capsule 1  . Vitamin D, Ergocalciferol, (DRISDOL) 50000 units CAPS capsule Take 1 capsule (50,000 Units total) by mouth 2 (two) times a week. 26 capsule 3  . VOLTAREN 1 % GEL APPLY 4 GRAMS TO AFFECTED AREA(S) 4 TIMES A DAY AS NEEDED 100 g 2   No current facility-administered medications for this visit.     Allergies  Allergen Reactions  . Cymbalta [Duloxetine Hcl] Other (See Comments)    Profuse sweating  . Meloxicam Nausea And Vomiting  . Diclofenac Diarrhea  . Crestor [Rosuvastatin] Other (See Comments)    Muscle aches on 5 mg daily and 10 mg twice weekly  . Lipitor [Atorvastatin] Other (See Comments)    Muscle aches  . Livalo [Pitavastatin] Other (See Comments)       . Sulfa Antibiotics Rash  . Zetia [Ezetimibe] Other (See Comments)    Muscle aches  . Zocor [Simvastatin] Other (See Comments)    Muscle aches     Past  Medical History:  Diagnosis Date  . Anemia   . Arthritis   . CAD (coronary artery disease)   . Cancer ([place]) 11/2015   melanoma right upper arm  . Diabetes mellitus without complication ([place])   . Diverticulosis   . Gout   . Hemochromatosis, hereditary ([place]) 11/24/2012  . Hyperlipidemia   . Hypertension   . Neuromuscular disorder ([place])    peripheral neuropathy    Social History   Socioeconomic History  . Marital status: Married    Spouse name: Not on file  . Number of children: Not on file  . Years of education: Not on file  . Highest education level: Not on file  Occupational History  . Not on file  Social Needs  . Financial resource strain: Not on file  . Food insecurity:    Worry: Not on file    Inability: Not on file  . Transportation needs:     Medical: Not on file    Non-medical: Not on file  Tobacco Use  . Smoking status: Never Smoker  . Smokeless tobacco: Never Used  . Tobacco comment: never used tobacco  Substance and Sexual Activity  . Alcohol use: No    Alcohol/week: 0.0 oz  . Drug use: No  . Sexual activity: Not Currently  Lifestyle  . Physical activity:    Days per week: Not on file    Minutes per session: Not on file  . Stress: Not on file  Relationships  . Social connections:    Talks on phone: Not on file    Gets together: Not on file    Attends religious service: Not on file    Active member of club or organization: Not on file    Attends meetings of clubs or organizations: Not on file    Relationship status: Not on file  . Intimate partner violence:    Fear of current or ex partner: Not on file    Emotionally abused: Not on file    Physically abused: Not on file    Forced sexual activity: Not on file  Other Topics Concern  . Not on file  Social History Narrative  . Not on file     Family History  Problem Relation Age of Onset  . Stroke Mother   . Hypertension Mother   . Neuropathy Mother   . Stroke Father   . Hypertension Father   . Diabetes Father   . Cancer Sister 70       sarcoma  . Diabetes Brother   . Arthritis Sister   . Arthritis Sister   . Lupus Sister   . Hemachromatosis Sister   . Arthritis Sister   . Hemachromatosis Sister   . Hemachromatosis Sister   . Diverticulitis Sister   . Diabetes Brother   . Diabetes Brother   . Hemachromatosis Brother      Review of Systems: General: negative for chills, fever, night sweats or weight changes.  Cardiovascular: negative for chest pain, dyspnea on exertion, edema, orthopnea, palpitations, paroxysmal nocturnal dyspnea or shortness of breath Dermatological: negative for rash Respiratory: negative for cough or wheezing Urologic: negative for hematuria Abdominal: negative for nausea, vomiting, diarrhea, bright red blood per rectum,  melena, or hematemesis Neurologic: negative for visual changes, syncope, or dizziness All other systems reviewed and are otherwise negative except as noted above.    Blood pressure 134/66, pulse (!) 58, height _0  (1.6 m), weight 186 lb (84.4 kg).  General appearance: alert, cooperative, no  distress and mildly obese Neck: no carotid bruit and no JVD Lungs: clear to auscultation bilaterally Heart: regular rate and rhythm Extremities: trace venous edema Skin: Skin color, texture, turgor normal. No rashes or lesions Neurologic: Grossly normal  EKG NSR, SB NSST changes  ASSESSMENT AND PLAN:   Pre-operative clearance Pt seen today for pre op clearance prior to uterine surgery  S/P CABG x 2 2007 [place], LIMA to LAD, SVG to PDA Myoview low risk 2017 No recent symptoms to suggest any change in her cardiac status  Hypertension associated with diabetes ([place]) Controlled  Non-insulin treated type 2 diabetes mellitus (HCC) On oral agents  HLD (hyperlipidemia) Statin inn tolerant- she is on PCSK9 LDL 79  Chronic renal insufficiency, stage 3 (moderate) (HCC) SCr 1.5    PLAN  Chart reviewed and patient seen and examined today in the office as part of pre-operative protocol coverage. Given past medical history and time since last visit, based on ACC/AHA guidelines, Carmen [name] would be at acceptable risk for the planned procedure without further cardiovascular testing.   I will route this recommendation to the requesting party via Epic fax function and remove from pre-op pool.  Please call with questions.  Kerin Ransom, PA-C 09/10/2017, 3:55 PM

## 2017-09-10 NOTE — Assessment & Plan Note (Signed)
2007 [place], LIMA to LAD, SVG to PDA Myoview low risk 2017 No recent symptoms to suggest any change in her cardiac status

## 2017-09-17 ENCOUNTER — Ambulatory Visit (INDEPENDENT_AMBULATORY_CARE_PROVIDER_SITE_OTHER): Payer: Medicare Other | Admitting: Family Medicine

## 2017-09-17 ENCOUNTER — Encounter: Payer: Self-pay | Admitting: Family Medicine

## 2017-09-17 VITALS — BP 132/71 | HR 62 | Temp 98.2°F | Ht 63.0 in | Wt 189.4 lb

## 2017-09-17 DIAGNOSIS — E1140 Type 2 diabetes mellitus with diabetic neuropathy, unspecified: Secondary | ICD-10-CM | POA: Diagnosis not present

## 2017-09-17 LAB — BAYER DCA HB A1C WAIVED: HB A1C (BAYER DCA - WAIVED): 7.9 % — ABNORMAL HIGH (ref <7.0)

## 2017-09-17 NOTE — Patient Instructions (Signed)
Hold praluent until after surgery. Hold aspirin 5 days before surgery.     Carbohydrate Counting for Diabetes Mellitus, Adult Carbohydrate counting is a method for keeping track of how many carbohydrates you eat. Eating carbohydrates naturally increases the amount of sugar (glucose) in the blood. Counting how many carbohydrates you eat helps keep your blood glucose within normal limits, which helps you manage your diabetes (diabetes mellitus). It is important to know how many carbohydrates you can safely have in each meal. This is different for every person. A diet and nutrition specialist (registered dietitian) can help you make a meal plan and calculate how many carbohydrates you should have at each meal and snack. Carbohydrates are found in the following foods:  Grains, such as breads and cereals.  Dried beans and soy products.  Starchy vegetables, such as potatoes, peas, and corn.  Fruit and fruit juices.  Milk and yogurt.  Sweets and snack foods, such as cake, cookies, candy, chips, and soft drinks.  How do I count carbohydrates? There are two ways to count carbohydrates in food. You can use either of the methods or a combination of both. Reading "Nutrition Facts" on packaged food The "Nutrition Facts" list is included on the labels of almost all packaged foods and beverages in the [place] It includes:  The serving size.  Information about nutrients in each serving, including the grams (g) of carbohydrate per serving.  To use the "Nutrition Facts":  Decide how many servings you will have.  Multiply the number of servings by the number of carbohydrates per serving.  The resulting number is the total amount of carbohydrates that you will be having.  Learning standard serving sizes of other foods When you eat foods containing carbohydrates that are not packaged or do not include "Nutrition Facts" on the label, you need to measure the servings in order to count the amount of  carbohydrates:  Measure the foods that you will eat with a food scale or measuring cup, if needed.  Decide how many standard-size servings you will eat.  Multiply the number of servings by 15. Most carbohydrate-rich foods have about 15 g of carbohydrates per serving. ? For example, if you eat 8 oz (170 g) of strawberries, you will have eaten 2 servings and 30 g of carbohydrates (2 servings x 15 g = 30 g).  For foods that have more than one food mixed, such as soups and casseroles, you must count the carbohydrates in each food that is included.  The following list contains standard serving sizes of common carbohydrate-rich foods. Each of these servings has about 15 g of carbohydrates:   hamburger bun or  English muffin.   oz (15 mL) syrup.   oz (14 g) jelly.  1 slice of bread.  1 six-inch tortilla.  3 oz (85 g) cooked rice or pasta.  4 oz (113 g) cooked dried beans.  4 oz (113 g) starchy vegetable, such as peas, corn, or potatoes.  4 oz (113 g) hot cereal.  4 oz (113 g) mashed potatoes or  of a large baked potato.  4 oz (113 g) canned or frozen fruit.  4 oz (120 mL) fruit juice.  4-6 crackers.  6 chicken nuggets.  6 oz (170 g) unsweetened dry cereal.  6 oz (170 g) plain fat-free yogurt or yogurt sweetened with artificial sweeteners.  8 oz (240 mL) milk.  8 oz (170 g) fresh fruit or one small piece of fruit.  24 oz (680 g) popped  popcorn.  Example of carbohydrate counting Sample meal  3 oz (85 g) chicken breast.  6 oz (170 g) brown rice.  4 oz (113 g) corn.  8 oz (240 mL) milk.  8 oz (170 g) strawberries with sugar-free whipped topping. Carbohydrate calculation 1. Identify the foods that contain carbohydrates: ? Rice. ? Corn. ? Milk. ? Strawberries. 2. Calculate how many servings you have of each food: ? 2 servings rice. ? 1 serving corn. ? 1 serving milk. ? 1 serving strawberries. 3. Multiply each number of servings by 15 g: ? 2 servings  rice x 15 g = 30 g. ? 1 serving corn x 15 g = 15 g. ? 1 serving milk x 15 g = 15 g. ? 1 serving strawberries x 15 g = 15 g. 4. Add together all of the amounts to find the total grams of carbohydrates eaten: ? 30 g + 15 g + 15 g + 15 g = 75 g of carbohydrates total. This information is not intended to replace advice given to you by your health care provider. Make sure you discuss any questions you have with your health care provider. Document Released: 03/09/2005 Document Revised: 09/27/2015 Document Reviewed: 08/21/2015 Elsevier Interactive Patient Education  [employer].

## 2017-09-17 NOTE — Progress Notes (Signed)
Subjective:  Patient ID: Carmen [name],  female    DOB: 03/09/1941  Age: 77 y.o.    CC: Medical Management of Chronic Issues   HPI MERI PELOT presents for  follow-up of hypertension. Patient has no history of headache chest pain or shortness of breath or recent cough. Patient also denies symptoms of TIA such as numbness weakness lateralizing. Patient denies side effects from medication. States taking it regularly.  Patient also  in for follow-up of elevated cholesterol. Doing well without complaints on current medication. Denies side effects  including myalgia and arthralgia and nausea. Also in today for liver function testing. Currently no chest pain, shortness of breath or other cardiovascular related symptoms noted.  Follow-up of diabetes. Patient does check blood sugar at home. Readings run between 1 29-1 39 and 150-1 60. Patient denies symptoms such as excessive hunger or urinary frequency, excessive hunger, nausea No significant hypoglycemic spells noted. Medications reviewed.  She is using Basaglar 5 units as needed blood sugar greater than 200.  She has had to do this every day for the last week.  Pt reports taking them regularly. Pt. denies complication/adverse reaction today.    History Catori has a past medical history of Anemia, Arthritis, CAD (coronary artery disease), Cancer ([place]) (11/2015), Diabetes mellitus without complication ([place]), Diverticulosis, Gout, Hemochromatosis, hereditary ([place]) (11/24/2012), Hyperlipidemia, Hypertension, and Neuromuscular disorder ([place]).   She has a past surgical history that includes Lipoma excision; Breast surgery; Cardiac catheterization (06/03/2005); and Coronary artery bypass graft (2007).   Her family history includes Arthritis in her sister, sister, and sister; Cancer (age of onset: 108) in her sister; Diabetes in her brother, brother, brother, and father; Diverticulitis in her sister; Hemachromatosis in her brother, sister,  sister, and sister; Hypertension in her father and mother; Lupus in her sister; Neuropathy in her mother; Stroke in her father and mother.She reports that she has never smoked. She has never used smokeless tobacco. She reports that she does not drink alcohol or use drugs.  Current Outpatient Medications on File Prior to Visit  Medication Sig Dispense Refill  . allopurinol (ZYLOPRIM) 100 MG tablet TAKE (1) TABLET TWICE A DAY. 180 tablet 1  . aspirin 81 MG tablet Take 81 mg by mouth daily.    . colchicine 0.6 MG tablet TAKE (1) TABLET DAILY AS NEEDED. 30 tablet 1  . fluticasone (FLONASE) 50 MCG/ACT nasal spray SPRAY 1 SPRAY IN EACH NOSTRIL ONCE DAILY. 16 g 4  . glucose blood (ONE TOUCH ULTRA TEST) test strip CHECK BLOOD SUGAR 2 TIMES A DAY 100 each prn  . Insulin Glargine (BASAGLAR KWIKPEN) 100 UNIT/ML SOPN Inject 0.05 mLs (5 Units total) into the skin at bedtime. Until Blood glucose is less than 200 1 pen 0  . Lancets Misc. (ACCU-CHEK FASTCLIX LANCET) KIT Use to check Blood Sugars 1 kit 0  . metoprolol tartrate (LOPRESSOR) 25 MG tablet TAKE (1) TABLET TWICE A DAY. 180 tablet 1  . olmesartan (BENICAR) 20 MG tablet Take 1 tablet (20 mg total) by mouth daily. 90 tablet 1  . omega-3 acid ethyl esters (LOVAZA) 1 g capsule TAKE (2) CAPSULES TWICE DAILY. 360 capsule 1  . PRALUENT 75 MG/ML SOPN Inject 75 mg under the skin every 14 (fourteen) days 2 pen 5  . pregabalin (LYRICA) 75 MG capsule Take 1 capsule (75 mg total) by mouth 2 (two) times daily. 60 capsule 2  . sitaGLIPtin (JANUVIA) 100 MG tablet Take 1 tablet (100 mg total) by mouth  daily. 90 tablet 1  . triamterene-hydrochlorothiazide (DYAZIDE) 37.5-25 MG capsule TAKE (1) CAPSULE DAILY 90 capsule 1  . Vitamin D, Ergocalciferol, (DRISDOL) 50000 units CAPS capsule Take 1 capsule (50,000 Units total) by mouth 2 (two) times a week. 26 capsule 3  . VOLTAREN 1 % GEL APPLY 4 GRAMS TO AFFECTED AREA(S) 4 TIMES A DAY AS NEEDED 100 g 2   No current  facility-administered medications on file prior to visit.     ROS Review of Systems  Constitutional: Negative.   HENT: Negative for congestion.   Eyes: Negative for visual disturbance.  Respiratory: Negative for shortness of breath.   Cardiovascular: Negative for chest pain.  Gastrointestinal: Negative for abdominal pain, constipation, diarrhea, nausea and vomiting.  Genitourinary: Negative for difficulty urinating.  Musculoskeletal: Negative for arthralgias and myalgias.  Neurological: Negative for headaches.  Psychiatric/Behavioral: Negative for sleep disturbance.    Objective:  BP 132/71   Pulse 62   Temp 98.2 F (36.8 C) (Oral)   Ht _0  (1.6 m)   Wt 189 lb 6 oz (85.9 kg)   BMI 33.55 kg/m   BP Readings from Last 3 Encounters:  09/17/17 132/71  09/10/17 134/66  08/19/17 (!) 168/79    Wt Readings from Last 3 Encounters:  09/17/17 189 lb 6 oz (85.9 kg)  09/10/17 186 lb (84.4 kg)  08/19/17 192 lb 6.4 oz (87.3 kg)     Physical Exam  Constitutional: She is oriented to person, place, and time. She appears well-developed and well-nourished. No distress.  HENT:  Head: Normocephalic and atraumatic.  Right Ear: External ear normal.  Left Ear: External ear normal.  Nose: Nose normal.  Mouth/Throat: Oropharynx is clear and moist.  Eyes: Pupils are equal, round, and reactive to light. Conjunctivae and EOM are normal.  Neck: Normal range of motion. Neck supple. No thyromegaly present.  Cardiovascular: Normal rate, regular rhythm and normal heart sounds.  No murmur heard. Pulmonary/Chest: Effort normal and breath sounds normal. No respiratory distress. She has no wheezes. She has no rales.  Abdominal: Soft. Bowel sounds are normal. She exhibits no distension. There is no tenderness.  Lymphadenopathy:    She has no cervical adenopathy.  Neurological: She is alert and oriented to person, place, and time. She has normal reflexes.  Skin: Skin is warm and dry.  Psychiatric:  She has a normal mood and affect. Her behavior is normal. Judgment and thought content normal.    Diabetic Foot Exam - Simple   Simple Foot Form Diabetic Foot exam was performed with the following findings:  Yes 09/17/2017  3:25 PM  Visual Inspection No deformities, no ulcerations, no other skin breakdown bilaterally:  Yes Sensation Testing Intact to touch and monofilament testing bilaterally:  Yes Pulse Check Posterior Tibialis and Dorsalis pulse intact bilaterally:  Yes Comments     Results for orders placed or performed in visit on 09/17/17  Bayer DCA Hb A1c Waived  Result Value Ref Range   HB A1C (BAYER DCA - WAIVED) 7.9 (H) <7.0 %      Assessment & Plan:   Peggi was seen today for medical management of chronic issues.  Diagnoses and all orders for this visit:  Controlled type 2 diabetes mellitus with diabetic neuropathy, without long-term current use of insulin ([place]) -     Referral to Nutrition and Diabetes Services -     CMP14+EGFR -     Bayer [place] Hb A1c Waived   I am having Carmen [name] maintain her  aspirin, fluticasone, VOLTAREN, omega-3 acid ethyl esters, ACCU-CHEK FASTCLIX LANCET, PRALUENT, allopurinol, colchicine, glucose blood, sitaGLIPtin, metoprolol tartrate, olmesartan, triamterene-hydrochlorothiazide, Vitamin D (Ergocalciferol), pregabalin, and BASAGLAR KWIKPEN.  Patient says she would like to have more information about diet.  She understands that she has to work on carbs but she is not clear exactly what carbs she does mention whole grains as being good for her.  She says she is never really been educated and would like more information.  Therefore she was given a handout on carb counting for diabetics and will arrange for her to see a nutritionist.  A1c was rather high today.  Therefore will increase her   Follow-up: Return in about 3 months (around 12/18/2017).  Claretta Fraise, M.D.

## 2017-09-17 NOTE — H&P (Signed)
NAME: Carmen [name], Carmen [name] MEDICAL RECORD [id number] ACCOUNT [id number] DATE OF BIRTH:[birth year] FACILITY: [facility] LOCATION:  PHYSICIAN:Sheriden Archibeque Garry Heater, MD  HISTORY AND PHYSICAL  DATE OF ADMISSION:  09/28/2017  CHIEF COMPLAINT:  Postmenopausal bleeding.  HISTORY OF PRESENT ILLNESS:  A 77 year old postmenopausal, G3 P3 who has been using a pessary for some pelvic organ prolapse had noted some spotting recently.  Presented for further evaluation by ultrasound in our office 08/24/2017 that demonstrated a  significant well defined 2.2 cm polyp within the endometrial cavity.  She presents now for D and C, hysteroscopy with MyoSure resection.  This procedure including the specific risks regarding bleeding, infection, other complications that may require  additional surgery reviewed with her, which she understands and accepts.  ALLERGIES:  SULFA AND STATINS.  MEDICATIONS:  Januvia, Lyrica, losartan, allopurinol, metoprolol, baby aspirin daily, Colchicine.  PAST SURGICAL HISTORY:  Three vaginal deliveries.  She had a prior unspecified heart surgery, may have been a stent.  Will have to clarify with her cardiologist.    REVIEW OF SYSTEMS:  Significant for pelvic organ prolapse, hypertension, diabetes.  She did have a heart attack in 2007.  SOCIAL HISTORY:  She denies alcohol, tobacco or drug use.  Dr. Sabra Heck is her PCP.  PHYSICAL EXAMINATION: VITAL SIGNS:  Temperature 98.2, blood pressure 140/78. HEENT:  Unremarkable. NECK:  Supple, without masses. LUNGS:  Clear. HEART:  Regular rate and rhythm without murmurs, rubs or gallops. BREASTS:  Without masses. ABDOMEN:  Soft, nontender.    PELVIC:  Vulva, vagina.  Cervix normal.  Uterus midposition, normal size.  Adnexa negative.  ASSESSMENT:  Postmenopausal bleeding, endometrial polyp.  PLAN:  D and C, hysteroscopy with polyp resection.    Procedure and risks discussed as above.  AN/NUANCE  D:09/06/2017 T:09/06/2017  JOB:[id number]

## 2017-09-18 LAB — CMP14+EGFR
ALK PHOS: 70 IU/L (ref 39–117)
ALT: 26 IU/L (ref 0–32)
AST: 23 IU/L (ref 0–40)
Albumin/Globulin Ratio: 1.7 (ref 1.2–2.2)
Albumin: 4.5 g/dL (ref 3.5–4.8)
BUN / CREAT RATIO: 14 (ref 12–28)
BUN: 19 mg/dL (ref 8–27)
Bilirubin Total: 0.3 mg/dL (ref 0.0–1.2)
CO2: 24 mmol/L (ref 20–29)
CREATININE: 1.34 mg/dL — AB (ref 0.57–1.00)
Calcium: 10.1 mg/dL (ref 8.7–10.3)
Chloride: 104 mmol/L (ref 96–106)
GFR calc Af Amer: 44 mL/min/{1.73_m2} — ABNORMAL LOW (ref >59)
GFR calc non Af Amer: 38 mL/min/{1.73_m2} — ABNORMAL LOW (ref >59)
GLOBULIN, TOTAL: 2.6 g/dL (ref 1.5–4.5)
GLUCOSE: 115 mg/dL — AB (ref 65–99)
Potassium: 4.8 mmol/L (ref 3.5–5.2)
SODIUM: 142 mmol/L (ref 134–144)
Total Protein: 7.1 g/dL (ref 6.0–8.5)

## 2017-09-21 DIAGNOSIS — N840 Polyp of corpus uteri: Secondary | ICD-10-CM | POA: Diagnosis not present

## 2017-09-21 DIAGNOSIS — N950 Postmenopausal bleeding: Secondary | ICD-10-CM | POA: Diagnosis not present

## 2017-09-22 ENCOUNTER — Encounter (HOSPITAL_BASED_OUTPATIENT_CLINIC_OR_DEPARTMENT_OTHER): Payer: Self-pay

## 2017-09-22 ENCOUNTER — Other Ambulatory Visit: Payer: Self-pay

## 2017-09-22 NOTE — Pre-Procedure Instructions (Signed)
Cardiac clearance L. Kilroy 09/10/2017 in epic.

## 2017-09-22 NOTE — Progress Notes (Signed)
Cardiac clearance note L. Kilroy 09/10/2017 in epic Spoke with:  Savilla NPO:  After Midnight, no gum, candy, or mints   Arrival time: 0530AM Labs:  Istat 8 (EKG 09/10/2017 in epic) AM medications:  Allopurinol, Metoprolol, Olmesartan, Pregabalin, Flonase if needed Pre op orders: Yes Ride home: [name] (daughter) [phone]

## 2017-09-28 ENCOUNTER — Encounter (HOSPITAL_BASED_OUTPATIENT_CLINIC_OR_DEPARTMENT_OTHER)
Admission: RE | Disposition: A | Discharge status: Home / Self Care | Payer: Self-pay | Source: Ambulatory Visit | Attending: Obstetrics and Gynecology

## 2017-09-28 ENCOUNTER — Ambulatory Visit (HOSPITAL_BASED_OUTPATIENT_CLINIC_OR_DEPARTMENT_OTHER): Payer: Medicare Other | Admitting: Anesthesiology

## 2017-09-28 ENCOUNTER — Encounter (HOSPITAL_BASED_OUTPATIENT_CLINIC_OR_DEPARTMENT_OTHER): Payer: Self-pay

## 2017-09-28 ENCOUNTER — Ambulatory Visit (HOSPITAL_BASED_OUTPATIENT_CLINIC_OR_DEPARTMENT_OTHER)
Admission: RE | Admit: 2017-09-28 | Discharge: 2017-09-28 | Disposition: A | Discharge status: Home / Self Care | Payer: Medicare Other | Source: Ambulatory Visit | Attending: Obstetrics and Gynecology | Admitting: Obstetrics and Gynecology

## 2017-09-28 DIAGNOSIS — I252 Old myocardial infarction: Secondary | ICD-10-CM | POA: Insufficient documentation

## 2017-09-28 DIAGNOSIS — N8189 Other female genital prolapse: Secondary | ICD-10-CM | POA: Diagnosis not present

## 2017-09-28 DIAGNOSIS — N950 Postmenopausal bleeding: Secondary | ICD-10-CM | POA: Insufficient documentation

## 2017-09-28 DIAGNOSIS — Z7982 Long term (current) use of aspirin: Secondary | ICD-10-CM | POA: Diagnosis not present

## 2017-09-28 DIAGNOSIS — Z7951 Long term (current) use of inhaled steroids: Secondary | ICD-10-CM | POA: Diagnosis not present

## 2017-09-28 DIAGNOSIS — Z794 Long term (current) use of insulin: Secondary | ICD-10-CM | POA: Insufficient documentation

## 2017-09-28 DIAGNOSIS — N840 Polyp of corpus uteri: Secondary | ICD-10-CM | POA: Insufficient documentation

## 2017-09-28 DIAGNOSIS — Z791 Long term (current) use of non-steroidal anti-inflammatories (NSAID): Secondary | ICD-10-CM | POA: Diagnosis not present

## 2017-09-28 DIAGNOSIS — E119 Type 2 diabetes mellitus without complications: Secondary | ICD-10-CM | POA: Diagnosis not present

## 2017-09-28 DIAGNOSIS — Z79899 Other long term (current) drug therapy: Secondary | ICD-10-CM | POA: Diagnosis not present

## 2017-09-28 DIAGNOSIS — I129 Hypertensive chronic kidney disease with stage 1 through stage 4 chronic kidney disease, or unspecified chronic kidney disease: Secondary | ICD-10-CM | POA: Diagnosis not present

## 2017-09-28 DIAGNOSIS — I10 Essential (primary) hypertension: Secondary | ICD-10-CM | POA: Insufficient documentation

## 2017-09-28 DIAGNOSIS — N183 Chronic kidney disease, stage 3 (moderate): Secondary | ICD-10-CM | POA: Diagnosis not present

## 2017-09-28 HISTORY — DX: Unspecified jaundice: R17

## 2017-09-28 HISTORY — DX: Female genital prolapse, unspecified: N81.9

## 2017-09-28 HISTORY — DX: Polyp of corpus uteri: N84.0

## 2017-09-28 HISTORY — DX: Chronic kidney disease, stage 3 (moderate): N18.3

## 2017-09-28 HISTORY — PX: DILATATION & CURETTAGE/HYSTEROSCOPY WITH MYOSURE: SHX6511

## 2017-09-28 HISTORY — DX: Ventricular premature depolarization: I49.3

## 2017-09-28 HISTORY — DX: Postmenopausal bleeding: N95.0

## 2017-09-28 HISTORY — DX: Other specified disorders of kidney and ureter: N28.89

## 2017-09-28 HISTORY — DX: Personal history of diseases of the blood and blood-forming organs and certain disorders involving the immune mechanism: Z86.2

## 2017-09-28 HISTORY — DX: Chronic kidney disease, stage 3 unspecified: N18.30

## 2017-09-28 HISTORY — DX: Other specified postprocedural states: Z98.890

## 2017-09-28 HISTORY — DX: Unspecified convulsions: R56.9

## 2017-09-28 HISTORY — DX: Vitamin D deficiency, unspecified: E55.9

## 2017-09-28 HISTORY — DX: Pain in right shoulder: M25.511

## 2017-09-28 HISTORY — DX: Residual hemorrhoidal skin tags: K64.4

## 2017-09-28 HISTORY — DX: Pain in left shoulder: M25.512

## 2017-09-28 HISTORY — DX: Personal history of other diseases of the digestive system: Z87.19

## 2017-09-28 HISTORY — DX: Nausea with vomiting, unspecified: R11.2

## 2017-09-28 HISTORY — DX: Acute myocardial infarction, unspecified: I21.9

## 2017-09-28 HISTORY — DX: Bitten or stung by nonvenomous insect and other nonvenomous arthropods, initial encounter: W57.XXXA

## 2017-09-28 HISTORY — DX: Other hemorrhoids: K64.8

## 2017-09-28 LAB — POCT I-STAT, CHEM 8
BUN: 23 mg/dL (ref 8–23)
CALCIUM ION: 1.34 mmol/L (ref 1.15–1.40)
CREATININE: 1.3 mg/dL — AB (ref 0.44–1.00)
Chloride: 105 mmol/L (ref 98–111)
Glucose, Bld: 161 mg/dL — ABNORMAL HIGH (ref 70–99)
HEMATOCRIT: 41 % (ref 36.0–46.0)
Hemoglobin: 13.9 g/dL (ref 12.0–15.0)
Potassium: 4 mmol/L (ref 3.5–5.1)
Sodium: 143 mmol/L (ref 135–145)
TCO2: 23 mmol/L (ref 22–32)

## 2017-09-28 LAB — GLUCOSE, CAPILLARY: GLUCOSE-CAPILLARY: 148 mg/dL — AB (ref 70–99)

## 2017-09-28 SURGERY — DILATATION & CURETTAGE/HYSTEROSCOPY WITH MYOSURE
Anesthesia: General

## 2017-09-28 MED ORDER — FENTANYL CITRATE (PF) 100 MCG/2ML IJ SOLN
25.0000 ug | INTRAMUSCULAR | Status: DC | PRN
  Filled 2017-09-28: qty 1

## 2017-09-28 MED ORDER — LIDOCAINE HCL 1 % IJ SOLN
INTRAMUSCULAR | Status: DC | PRN
  Administered 2017-09-28: 8 mL

## 2017-09-28 MED ORDER — SODIUM CHLORIDE 0.9 % IV SOLN
INTRAVENOUS | Status: DC
  Administered 2017-09-28 (×2): via INTRAVENOUS
  Filled 2017-09-28: qty 1000

## 2017-09-28 MED ORDER — ONDANSETRON HCL 4 MG/2ML IJ SOLN
INTRAMUSCULAR | Status: DC | PRN
  Administered 2017-09-28: 4 mg via INTRAVENOUS

## 2017-09-28 MED ORDER — PROMETHAZINE HCL 25 MG/ML IJ SOLN
6.2500 mg | INTRAMUSCULAR | Status: DC | PRN
  Filled 2017-09-28: qty 1

## 2017-09-28 MED ORDER — KETOROLAC TROMETHAMINE 15 MG/ML IJ SOLN
INTRAMUSCULAR | Status: DC | PRN
  Administered 2017-09-28: 15 mg via INTRAVENOUS

## 2017-09-28 MED ORDER — LIDOCAINE 2% (20 MG/ML) 5 ML SYRINGE
INTRAMUSCULAR | Status: DC | PRN
  Administered 2017-09-28: 60 mg via INTRAVENOUS

## 2017-09-28 MED ORDER — METOCLOPRAMIDE HCL 5 MG/ML IJ SOLN
INTRAMUSCULAR | Status: DC | PRN
  Administered 2017-09-28: 10 mg via INTRAVENOUS

## 2017-09-28 MED ORDER — LIDOCAINE 2% (20 MG/ML) 5 ML SYRINGE
INTRAMUSCULAR | Status: AC
  Filled 2017-09-28: qty 5

## 2017-09-28 MED ORDER — FENTANYL CITRATE (PF) 100 MCG/2ML IJ SOLN
INTRAMUSCULAR | Status: DC | PRN
  Administered 2017-09-28: 25 ug via INTRAVENOUS
  Administered 2017-09-28: 50 ug via INTRAVENOUS

## 2017-09-28 MED ORDER — FENTANYL CITRATE (PF) 100 MCG/2ML IJ SOLN
INTRAMUSCULAR | Status: AC
  Filled 2017-09-28: qty 2

## 2017-09-28 MED ORDER — ONDANSETRON HCL 4 MG/2ML IJ SOLN
INTRAMUSCULAR | Status: AC
  Filled 2017-09-28: qty 2

## 2017-09-28 MED ORDER — SODIUM CHLORIDE 0.9 % IV SOLN
INTRAVENOUS | Status: AC
  Filled 2017-09-28: qty 2

## 2017-09-28 MED ORDER — MIDAZOLAM HCL 2 MG/2ML IJ SOLN
INTRAMUSCULAR | Status: AC
  Filled 2017-09-28: qty 2

## 2017-09-28 MED ORDER — SODIUM CHLORIDE 0.9 % IV SOLN
2.0000 g | INTRAVENOUS | Status: AC
  Administered 2017-09-28: 2 g via INTRAVENOUS
  Filled 2017-09-28: qty 2

## 2017-09-28 MED ORDER — EPHEDRINE SULFATE-NACL 50-0.9 MG/10ML-% IV SOSY
PREFILLED_SYRINGE | INTRAVENOUS | Status: DC | PRN
  Administered 2017-09-28 (×2): 10 mg via INTRAVENOUS

## 2017-09-28 MED ORDER — PROPOFOL 10 MG/ML IV BOLUS
INTRAVENOUS | Status: AC
  Filled 2017-09-28: qty 20

## 2017-09-28 MED ORDER — PROPOFOL 10 MG/ML IV BOLUS
INTRAVENOUS | Status: DC | PRN
  Administered 2017-09-28: 150 mg via INTRAVENOUS

## 2017-09-28 SURGICAL SUPPLY — 25 items
CANISTER SUCT 3000ML PPV (MISCELLANEOUS) ×3 IMPLANT
CATH ROBINSON RED A/P 16FR (CATHETERS) ×1 IMPLANT
CLOTH BEACON ORANGE TIMEOUT ST (SAFETY) ×3 IMPLANT
COUNTER NEEDLE 1200 MAGNETIC (NEEDLE) ×1 IMPLANT
DEVICE MYOSURE LITE (MISCELLANEOUS) ×2 IMPLANT
DEVICE MYOSURE REACH (MISCELLANEOUS) IMPLANT
DILATOR CANAL MILEX (MISCELLANEOUS) IMPLANT
FILTER ARTHROSCOPY CONVERTOR (FILTER) ×3 IMPLANT
GLOVE BIO SURGEON STRL SZ7 (GLOVE) ×3 IMPLANT
GLOVE BIOGEL PI IND STRL 7.0 (GLOVE) ×1 IMPLANT
GLOVE BIOGEL PI INDICATOR 7.0 (GLOVE) ×2
GOWN STRL REUS W/TWL LRG LVL3 (GOWN DISPOSABLE) ×6 IMPLANT
IV NS IRRIG 3000ML ARTHROMATIC (IV SOLUTION) ×4 IMPLANT
KIT TURNOVER CYSTO (KITS) ×3 IMPLANT
MYOSURE XL FIBROID REM (MISCELLANEOUS)
PACK VAGINAL MINOR WOMEN LF (CUSTOM PROCEDURE TRAY) ×3 IMPLANT
PAD OB MATERNITY 4.3X12.25 (PERSONAL CARE ITEMS) ×3 IMPLANT
PAD PREP 24X48 CUFFED NSTRL (MISCELLANEOUS) ×3 IMPLANT
SCRUB TECHNI CARE 32 OZ (MISCELLANEOUS) ×2 IMPLANT
SEAL ROD LENS SCOPE MYOSURE (ABLATOR) ×3 IMPLANT
SYSTEM TISS REMOVAL MYSR XL RM (MISCELLANEOUS) IMPLANT
TOWEL OR 17X24 6PK STRL BLUE (TOWEL DISPOSABLE) ×6 IMPLANT
TUBING AQUILEX INFLOW (TUBING) ×3 IMPLANT
TUBING AQUILEX OUTFLOW (TUBING) ×3 IMPLANT
WATER STERILE IRR 500ML POUR (IV SOLUTION) ×1 IMPLANT

## 2017-09-28 NOTE — Anesthesia Procedure Notes (Signed)
Procedure Name: LMA Insertion Date/Time: 09/28/2017 7:29 AM Performed by: Suan Halter, CRNA Pre-anesthesia Checklist: Patient identified, Emergency Drugs available, Suction available and Patient being monitored Patient Re-evaluated:Patient Re-evaluated prior to induction Oxygen Delivery Method: Circle system utilized Preoxygenation: Pre-oxygenation with 100% oxygen Induction Type: IV induction Ventilation: Mask ventilation without difficulty LMA: LMA inserted LMA Size: 4.0 Number of attempts: 1 Airway Equipment and Method: Bite block Placement Confirmation: positive ETCO2 Tube secured with: Tape Dental Injury: Teeth and Oropharynx as per pre-operative assessment

## 2017-09-28 NOTE — Op Note (Signed)
Preoperative diagnosis: Postmenopausal bleeding, endometrial polyp  Postoperative diagnosis: Same  Procedure: D&C, hysteroscopy, resection of endometrial polyp with MyoSure  Surgeon: Matthew Saras  Anesthesia: General  EBL: Less than 10 cc  Procedure and findings:  Patient was taken to the operating room after an adequate level of general anesthesia was obtained with the patient's legs in stirrups the perineum and vagina were prepped and draped appropriate timeouts were taken at that point.  EUA was carried out, uterus mid position normal size adnexa negative.  Weighted speculum was positioned cervix grasped with a tenaculum paracervical block was then created by infiltrating at 3 at 9:00 submucosally 5 to 7 cc of 1% plain Xylocaine after negative aspiration.  The uterus was then sounded to 7 cm progressively dilated to 27 Pratt dilator continuous-flow hysteroscope was then inserted with an excellent view there was a well-defined benign-appearing polyp that was fairly large emanating from the fundus.  The mild sure light was inserted and under direct visualization resected completely down to the surrounding endometrium.  There was minimal bleeding the remainder of the cavity was normal this was all submitted to pathology she tolerated this well went to recovery room in good condition.  Dictated with Dragon Medical 1  Margarette Asal MD

## 2017-09-28 NOTE — Anesthesia Preprocedure Evaluation (Signed)
Anesthesia Evaluation  Patient identified by MRN, date of birth, ID band Patient awake    Reviewed: Allergy & Precautions, NPO status , Patient's Chart, lab work & pertinent test results  History of Anesthesia Complications (+) PONV  Airway Mallampati: II  TM Distance: >3 FB Neck ROM: Full    Dental no notable dental hx.    Pulmonary neg pulmonary ROS,    Pulmonary exam normal breath sounds clear to auscultation       Cardiovascular hypertension, + Past MI  Normal cardiovascular exam Rhythm:Regular Rate:Normal     Neuro/Psych negative neurological ROS  negative psych ROS   GI/Hepatic negative GI ROS, Neg liver ROS,   Endo/Other  diabetes  Renal/GU Renal InsufficiencyRenal disease  negative genitourinary   Musculoskeletal negative musculoskeletal ROS (+)   Abdominal   Peds negative pediatric ROS (+)  Hematology negative hematology ROS (+)   Anesthesia Other Findings   Reproductive/Obstetrics negative OB ROS                             Anesthesia Physical Anesthesia Plan  ASA: III  Anesthesia Plan: General   Post-op Pain Management:    Induction: Intravenous  PONV Risk Score and Plan: 3 and Ondansetron, Treatment may vary due to age or medical condition and Dexamethasone  Airway Management Planned: LMA  Additional Equipment:   Intra-op Plan:   Post-operative Plan: Extubation in OR  Informed Consent: I have reviewed the patients History and Physical, chart, labs and discussed the procedure including the risks, benefits and alternatives for the proposed anesthesia with the patient or authorized representative who has indicated his/her understanding and acceptance.   Dental advisory given  Plan Discussed with: CRNA and Surgeon  Anesthesia Plan Comments:         Anesthesia Quick Evaluation

## 2017-09-28 NOTE — Anesthesia Postprocedure Evaluation (Signed)
Anesthesia Post Note  Patient: Carmen [name]  Procedure(s) Performed: DILATATION & CURETTAGE/HYSTEROSCOPY WITH MYOSURE (N/A )     Patient location during evaluation: PACU Anesthesia Type: General Level of consciousness: awake and alert Pain management: pain level controlled Vital Signs Assessment: post-procedure vital signs reviewed and stable Respiratory status: spontaneous breathing, nonlabored ventilation, respiratory function stable and patient connected to nasal cannula oxygen Cardiovascular status: blood pressure returned to baseline and stable Postop Assessment: no apparent nausea or vomiting Anesthetic complications: no    Last Vitals:  Vitals:   09/28/17 0830 09/28/17 0845  BP: 132/60 (!) 146/61  Pulse: 64 61  Resp: 12 12  Temp:    SpO2: 95% 96%    Last Pain:  Vitals:   09/28/17 0830  TempSrc:   PainSc: 0-No pain                 Deniese Oberry S

## 2017-09-28 NOTE — Progress Notes (Signed)
The patient was re-examined with no change in status 

## 2017-09-28 NOTE — Transfer of Care (Signed)
Immediate Anesthesia Transfer of Care Note  Patient: Carmen [name]  Procedure(s) Performed: Procedure(s) (LRB): DILATATION & CURETTAGE/HYSTEROSCOPY WITH MYOSURE (N/A)  Patient Location: PACU  Anesthesia Type: General  Level of Consciousness: awake, oriented, sedated and patient cooperative  Airway & Oxygen Therapy: Patient Spontanous Breathing and Patient connected to face mask oxygen  Post-op Assessment: Report given to PACU RN and Post -op Vital signs reviewed and stable  Post vital signs: Reviewed and stable  Complications: No apparent anesthesia complications Last Vitals:  Vitals Value Taken Time  BP 141/55 09/28/2017  8:03 AM  Temp    Pulse 67 09/28/2017  8:04 AM  Resp 11 09/28/2017  8:04 AM  SpO2 98 % 09/28/2017  8:04 AM  Vitals shown include unvalidated device data.  Last Pain:  Vitals:   09/28/17 0601  TempSrc:   PainSc: 0-No pain      Patients Stated Pain Goal: 5 (09/28/17 0601)

## 2017-09-28 NOTE — Discharge Instructions (Signed)

## 2017-09-29 ENCOUNTER — Encounter (HOSPITAL_BASED_OUTPATIENT_CLINIC_OR_DEPARTMENT_OTHER): Payer: Self-pay | Admitting: Obstetrics and Gynecology

## 2017-10-13 ENCOUNTER — Other Ambulatory Visit: Payer: Self-pay | Admitting: Family Medicine

## 2017-10-13 NOTE — Telephone Encounter (Signed)
Last seen 6./28/19  Dr Livia Snellen

## 2017-10-20 ENCOUNTER — Encounter: Payer: Self-pay | Admitting: Skilled Nursing Facility1

## 2017-10-20 ENCOUNTER — Encounter: Payer: Medicare Other | Attending: Family Medicine | Admitting: Skilled Nursing Facility1

## 2017-10-20 DIAGNOSIS — E1140 Type 2 diabetes mellitus with diabetic neuropathy, unspecified: Secondary | ICD-10-CM | POA: Insufficient documentation

## 2017-10-20 DIAGNOSIS — Z713 Dietary counseling and surveillance: Secondary | ICD-10-CM | POA: Insufficient documentation

## 2017-10-20 DIAGNOSIS — E119 Type 2 diabetes mellitus without complications: Secondary | ICD-10-CM

## 2017-10-20 NOTE — Progress Notes (Signed)
Diabetes Self-Management Education  Visit Type: First/Initial  10/20/2017  [name], identified by name and date of birth, is a 77 y.o. female with a diagnosis of Diabetes: Type 2.   ASSESSMENT  There were no vitals taken for this visit. There is no height or weight on file to calculate BMI.  Pt states her morning sugars are high: 156, 158 and they get better and takes ehr blood sugar more often when she is taking prednisone. Pt states she only takes her blood sugar in the morning if her blood sugar is over 200 in the morning. Pt states she is injecting the insulin in her leg. Pt states she has a hard time sleeping due to the shoulder pain.   Goals: March in the house and walk for at least 30 minutes 5-6 days a week Have protein with your carbohydrate choices   Diabetes Self-Management Education - 10/20/17 1020      Visit Information   Visit Type  First/Initial      Initial Visit   Diabetes Type  Type 2    Are you currently following a meal plan?  No    Are you taking your medications as prescribed?  Yes      Health Coping   How would you rate your overall health?  Good      Psychosocial Assessment   Patient Belief/Attitude about Diabetes  Motivated to manage diabetes      Pre-Education Assessment   Patient understands the diabetes disease and treatment process.  Needs Instruction    Patient understands incorporating nutritional management into lifestyle.  Needs Instruction    Patient undertands incorporating physical activity into lifestyle.  Needs Instruction    Patient understands using medications safely.  Needs Instruction    Patient understands monitoring blood glucose, interpreting and using results  Needs Instruction    Patient understands prevention, detection, and treatment of acute complications.  Needs Instruction    Patient understands prevention, detection, and treatment of chronic complications.  Needs Instruction    Patient understands how to  develop strategies to address psychosocial issues.  Needs Instruction    Patient understands how to develop strategies to promote health/change behavior.  Needs Instruction      Complications   Last HgB A1C per patient/outside source  7.9 %    How often do you check your blood sugar?  1-2 times/day    Number of hypoglycemic episodes per month  0    Number of hyperglycemic episodes per week  2    Can you tell when your blood sugar is high?  No    Have you had a dilated eye exam in the past 12 months?  Yes    Have you had a dental exam in the past 12 months?  Yes    Are you checking your feet?  Yes    How many days per week are you checking your feet?  7      Dietary Intake   Breakfast  up at 9am coffee with cream    Snack (morning)  10am oatmeal or toast with butter or fast food biscuit    Lunch  2 pm frsh tomato sandwich or salad     Dinner  7pm corn and peas and meatloaf or tacos     Snack (evening)  appelsauce or fruit or dream cicle     Beverage(s)  coffee, juice, water, sweet tea      Exercise   Exercise Type  ADL's  How many days per week to you exercise?  0    How many minutes per day do you exercise?  0    Total minutes per week of exercise  0      Patient Education   Previous Diabetes Education  No    Disease state   Factors that contribute to the development of diabetes    Nutrition management   Role of diet in the treatment of diabetes and the relationship between the three main macronutrients and blood glucose level;Food label reading, portion sizes and measuring food.;Carbohydrate counting    Physical activity and exercise   Role of exercise on diabetes management, blood pressure control and cardiac health.;Identified with patient nutritional and/or medication changes necessary with exercise.;Helped patient identify appropriate exercises in relation to his/her diabetes, diabetes complications and other health issue.    Medications  Taught/reviewed insulin injection,  site rotation, insulin storage and needle disposal.;Reviewed medication adjustment guidelines for hyperglycemia and sick days.;Reviewed patients medication for diabetes, action, purpose, timing of dose and side effects.    Monitoring  Taught/evaluated SMBG meter.;Purpose and frequency of SMBG.;Yearly dilated eye exam;Daily foot exams    Acute complications  Taught treatment of hypoglycemia - the 15 rule.;Discussed and identified patients' treatment of hyperglycemia.    Chronic complications  Relationship between chronic complications and blood glucose control;Assessed and discussed foot care and prevention of foot problems    Psychosocial adjustment  Role of stress on diabetes;Worked with patient to identify barriers to care and solutions      Individualized Goals (developed by patient)   Nutrition  General guidelines for healthy choices and portions discussed    Physical Activity  Exercise 5-7 days per week;30 minutes per day      Post-Education Assessment   Patient understands the diabetes disease and treatment process.  Demonstrates understanding / competency    Patient understands incorporating nutritional management into lifestyle.  Demonstrates understanding / competency    Patient undertands incorporating physical activity into lifestyle.  Demonstrates understanding / competency    Patient understands using medications safely.  Demonstrates understanding / competency    Patient understands monitoring blood glucose, interpreting and using results  Demonstrates understanding / competency    Patient understands prevention, detection, and treatment of acute complications.  Demonstrates understanding / competency    Patient understands prevention, detection, and treatment of chronic complications.  Demonstrates understanding / competency    Patient understands how to develop strategies to address psychosocial issues.  Demonstrates understanding / competency    Patient understands how to develop  strategies to promote health/change behavior.  Demonstrates understanding / competency      Outcomes   Expected Outcomes  Demonstrated interest in learning. Expect positive outcomes    Future DMSE  PRN    Program Status  Completed       Individualized Plan for Diabetes Self-Management Training:   Learning Objective:  Patient will have a greater understanding of diabetes self-management. Patient education plan is to attend individual and/or group sessions per assessed needs and concerns.   Plan:   There are no Patient Instructions on file for this visit.  Expected Outcomes:  Demonstrated interest in learning. Expect positive outcomes  Education material provided: ADA Diabetes: Your Take Control Guide, My Plate and Snack sheet  If problems or questions, patient to contact team via:  Phone  Future DSME appointment: PRN

## 2017-10-28 ENCOUNTER — Other Ambulatory Visit: Payer: Self-pay | Admitting: Family Medicine

## 2017-10-29 ENCOUNTER — Other Ambulatory Visit: Payer: Self-pay | Admitting: Family Medicine

## 2017-10-29 ENCOUNTER — Other Ambulatory Visit: Payer: Self-pay

## 2017-10-29 DIAGNOSIS — Z1239 Encounter for other screening for malignant neoplasm of breast: Secondary | ICD-10-CM

## 2017-11-08 ENCOUNTER — Other Ambulatory Visit: Payer: Self-pay | Admitting: Family Medicine

## 2017-11-08 ENCOUNTER — Encounter: Payer: Self-pay | Admitting: *Deleted

## 2017-11-08 ENCOUNTER — Ambulatory Visit (INDEPENDENT_AMBULATORY_CARE_PROVIDER_SITE_OTHER): Payer: Medicare Other | Admitting: *Deleted

## 2017-11-08 ENCOUNTER — Other Ambulatory Visit (INDEPENDENT_AMBULATORY_CARE_PROVIDER_SITE_OTHER): Payer: Medicare Other

## 2017-11-08 VITALS — BP 134/79 | HR 85 | Wt 192.0 lb

## 2017-11-08 DIAGNOSIS — Z780 Asymptomatic menopausal state: Secondary | ICD-10-CM | POA: Diagnosis not present

## 2017-11-08 DIAGNOSIS — Z0000 Encounter for general adult medical examination without abnormal findings: Secondary | ICD-10-CM

## 2017-11-08 DIAGNOSIS — M85851 Other specified disorders of bone density and structure, right thigh: Secondary | ICD-10-CM | POA: Diagnosis not present

## 2017-11-08 NOTE — Patient Instructions (Addendum)
Please remember to schedule your eye exam.  Please review information given on Advanced Directives, and if completed please bring a copy to our office to be filed in your medical record.  Thank you for coming in for your Annual Wellness Visit today!    Preventive Care 55 Years and Older, Female Preventive care refers to lifestyle choices and visits with your health care provider that can promote health and wellness. What does preventive care include?  A yearly physical exam. This is also called an annual well check.  Dental exams once or twice a year.  Routine eye exams. Ask your health care provider how often you should have your eyes checked.  Personal lifestyle choices, including: ? Daily care of your teeth and gums. ? Regular physical activity. ? Eating a healthy diet. ? Avoiding tobacco and drug use. ? Limiting alcohol use. ? Practicing safe sex. ? Taking low-dose aspirin every day. ? Taking vitamin and mineral supplements as recommended by your health care provider. What happens during an annual well check? The services and screenings done by your health care provider during your annual well check will depend on your age, overall health, lifestyle risk factors, and family history of disease. Counseling Your health care provider may ask you questions about your:  Alcohol use.  Tobacco use.  Drug use.  Emotional well-being.  Home and relationship well-being.  Sexual activity.  Eating habits.  History of falls.  Memory and ability to understand (cognition).  Work and work Statistician.  Reproductive health.  Screening You may have the following tests or measurements:  Height, weight, and BMI.  Blood pressure.  Lipid and cholesterol levels. These may be checked every 5 years, or more frequently if you are over 54 years old.  Skin check.  Lung cancer screening. You may have this screening every year starting at age 61 if you have a 30-pack-year history  of smoking and currently smoke or have quit within the past 15 years.  Fecal occult blood test (FOBT) of the stool. You may have this test every year starting at age 59.  Flexible sigmoidoscopy or colonoscopy. You may have a sigmoidoscopy every 5 years or a colonoscopy every 10 years starting at age 19.  Hepatitis C blood test.  Hepatitis B blood test.  Sexually transmitted disease (STD) testing.  Diabetes screening. This is done by checking your blood sugar (glucose) after you have not eaten for a while (fasting). You may have this done every 1-3 years.  Bone density scan. This is done to screen for osteoporosis. You may have this done starting at age 28.  Mammogram. This may be done every 1-2 years. Talk to your health care provider about how often you should have regular mammograms.  Talk with your health care provider about your test results, treatment options, and if necessary, the need for more tests. Vaccines Your health care provider may recommend certain vaccines, such as:  Influenza vaccine. This is recommended every year.  Tetanus, diphtheria, and acellular pertussis (Tdap, Td) vaccine. You may need a Td booster every 10 years.  Varicella vaccine. You may need this if you have not been vaccinated.  Zoster vaccine. You may need this after age 91.  Measles, mumps, and rubella (MMR) vaccine. You may need at least one dose of MMR if you were born in 1957 or later. You may also need a second dose.  Pneumococcal 13-valent conjugate (PCV13) vaccine. One dose is recommended after age 48.  Pneumococcal polysaccharide (PPSV23) vaccine.  One dose is recommended after age 64.  Meningococcal vaccine. You may need this if you have certain conditions.  Hepatitis A vaccine. You may need this if you have certain conditions or if you travel or work in places where you may be exposed to hepatitis A.  Hepatitis B vaccine. You may need this if you have certain conditions or if you travel  or work in places where you may be exposed to hepatitis B.  Haemophilus influenzae type b (Hib) vaccine. You may need this if you have certain conditions.  Talk to your health care provider about which screenings and vaccines you need and how often you need them. This information is not intended to replace advice given to you by your health care provider. Make sure you discuss any questions you have with your health care provider. Document Released: 04/05/2015 Document Revised: 11/27/2015 Document Reviewed: 01/08/2015 Elsevier Interactive Patient Education  2018 [place] in the Home Falls can cause injuries. They can happen to people of all ages. There are many things you can do to make your home safe and to help prevent falls. What can I do on the outside of my home?  Regularly fix the edges of walkways and driveways and fix any cracks.  Remove anything that might make you trip as you walk through a door, such as a raised step or threshold.  Trim any bushes or trees on the path to your home.  Use bright outdoor lighting.  Clear any walking paths of anything that might make someone trip, such as rocks or tools.  Regularly check to see if handrails are loose or broken. Make sure that both sides of any steps have handrails.  Any raised decks and porches should have guardrails on the edges.  Have any leaves, snow, or ice cleared regularly.  Use sand or salt on walking paths during winter.  Clean up any spills in your garage right away. This includes oil or grease spills. What can I do in the bathroom?  Use night lights.  Install grab bars by the toilet and in the tub and shower. Do not use towel bars as grab bars.  Use non-skid mats or decals in the tub or shower.  If you need to sit down in the shower, use a plastic, non-slip stool.  Keep the floor dry. Clean up any water that spills on the floor as soon as it happens.  Remove soap buildup in the tub  or shower regularly.  Attach bath mats securely with double-sided non-slip rug tape.  Do not have throw rugs and other things on the floor that can make you trip. What can I do in the bedroom?  Use night lights.  Make sure that you have a light by your bed that is easy to reach.  Do not use any sheets or blankets that are too big for your bed. They should not hang down onto the floor.  Have a firm chair that has side arms. You can use this for support while you get dressed.  Do not have throw rugs and other things on the floor that can make you trip. What can I do in the kitchen?  Clean up any spills right away.  Avoid walking on wet floors.  Keep items that you use a lot in easy-to-reach places.  If you need to reach something above you, use a strong step stool that has a grab bar.  Keep electrical cords out of the  way.  Do not use floor polish or wax that makes floors slippery. If you must use wax, use non-skid floor wax.  Do not have throw rugs and other things on the floor that can make you trip. What can I do with my stairs?  Do not leave any items on the stairs.  Make sure that there are handrails on both sides of the stairs and use them. Fix handrails that are broken or loose. Make sure that handrails are as long as the stairways.  Check any carpeting to make sure that it is firmly attached to the stairs. Fix any carpet that is loose or worn.  Avoid having throw rugs at the top or bottom of the stairs. If you do have throw rugs, attach them to the floor with carpet tape.  Make sure that you have a light switch at the top of the stairs and the bottom of the stairs. If you do not have them, ask someone to add them for you. What else can I do to help prevent falls?  Wear shoes that: ? Do not have high heels. ? Have rubber bottoms. ? Are comfortable and fit you well. ? Are closed at the toe. Do not wear sandals.  If you use a stepladder: ? Make sure that it is  fully opened. Do not climb a closed stepladder. ? Make sure that both sides of the stepladder are locked into place. ? Ask someone to hold it for you, if possible.  Clearly mark and make sure that you can see: ? Any grab bars or handrails. ? First and last steps. ? Where the edge of each step is.  Use tools that help you move around (mobility aids) if they are needed. These include: ? Canes. ? Walkers. ? Scooters. ? Crutches.  Turn on the lights when you go into a dark area. Replace any light bulbs as soon as they burn out.  Set up your furniture so you have a clear path. Avoid moving your furniture around.  If any of your floors are uneven, fix them.  If there are any pets around you, be aware of where they are.  Review your medicines with your doctor. Some medicines can make you feel dizzy. This can increase your chance of falling. Ask your doctor what other things that you can do to help prevent falls. This information is not intended to replace advice given to you by your health care provider. Make sure you discuss any questions you have with your health care provider. Document Released: 01/03/2009 Document Revised: 08/15/2015 Document Reviewed: 04/13/2014 Elsevier Interactive Patient Education  [employer].

## 2017-11-09 ENCOUNTER — Encounter: Payer: Self-pay | Admitting: Internal Medicine

## 2017-11-09 ENCOUNTER — Ambulatory Visit (INDEPENDENT_AMBULATORY_CARE_PROVIDER_SITE_OTHER): Payer: Medicare Other | Admitting: Internal Medicine

## 2017-11-09 VITALS — BP 126/71 | HR 59 | Ht 63.0 in | Wt 191.8 lb

## 2017-11-09 DIAGNOSIS — Z951 Presence of aortocoronary bypass graft: Secondary | ICD-10-CM | POA: Diagnosis not present

## 2017-11-09 DIAGNOSIS — E782 Mixed hyperlipidemia: Secondary | ICD-10-CM | POA: Diagnosis not present

## 2017-11-09 DIAGNOSIS — Z789 Other specified health status: Secondary | ICD-10-CM | POA: Diagnosis not present

## 2017-11-09 NOTE — Patient Instructions (Signed)
Your physician recommends that you return for lab work fasting to check cholesterol   Your physician wants you to follow-up in: 6 months with Dr. Debara Pickett. You will receive a reminder letter in the mail two months in advance. If you don't receive a letter, please call our office to schedule the follow-up appointment.

## 2017-11-09 NOTE — Progress Notes (Signed)
OFFICE NOTE  Chief Complaint:  No complaints  Primary Care Physician: Claretta Fraise, MD  HPI:  Carmen [name] is a 77 year-old female comes in for followup of coronary disease. She had bypass surgery in 2007 and at that time had a 40% ejection fraction. She had a nuclear study in May of 2012 that was a low-risk study, but we could not calculate an ejection fraction because of ectopy. She has had no episodes of angina, no unusual shortness of breath unless she walks up a hill or a flight of steps, and then she becomes mildly breathless. She does not have PND or orthopnea. She does not have a productive cough. She has had no awareness of any arrhythmias, no dizziness, no syncope. She does have mild left ankle edema that is dependent in nature. She wears support hose infrequently for this. She has recurrent problems with gout, usually in her great toe. She has made some significant dietary changes, and the gout episodes seem to be less.  She has been diabetic for around 2 years. She has a hemoglobin A1c of 5.8, has had no significant episodes of hypoglycemia. She has noted some proximal lower extremity weakness. She gave an example working in her garden, when she tried to get up her legs were so weak that she had to get up on all fours, and one time her husband had to get her up. She has gone through physical therapy recently because of right hip bursitis, and it may very well be that she needs additional physical therapy for proximal muscle weakness and core strength building. No significant problems from the pollen. She has had some problems with constipation. Otherwise, no new complaints today.  Of note she's had a markedly abnormal lipid profile with particle numbers greater than 3000. She has been intolerant to all statins and has failed Crestor, Zetia, Zocor, Lipitor and Livalo in the past. I feel that she remains at very high risk of cardiac events and there is a great likelihood that  she may develop premature graft failure with her high cholesterol numbers. We did discuss the new PCSK9 inhibitors, for which she may be a great candidate if she is willing to give herself an injection every 2-4 weeks..  I saw Carmen [name] back in the office today. She is doing really well and denies any chest pain or worsening shortness of breath. She was referred for a trial of a study drug for PCS K9 inhibition. Unfortunately this was never arranged however subsequently to [facility] SK 9 inhibitors came out to the market. She was subsequently started on Praluent by her primary care provider and is been using that for several months. Hopefully this will get her cholesterol down to goal.  11/12/2015  Carmen [name] returns today for follow-up. Over the past year she's done fairly well. She occasionally gets some swelling in her legs. She denies any chest pain or shortness of breath. We had started her on Praluent at her last office visit due to statin intolerance. I again discussed the importance of using at least low-dose statin with the Praluent foremost benefit, however she has had a significant reduction in her cholesterol with LDL that was greater than 180 now down to 82. Although she has not had any coronary event since her bypass, it is now been 10 years since her bypass surgery. She last had a stress test in 2012 which was negative for ischemia.  10/29/2016  Carmen [name] was seen today in follow-up.  Unfortunately she lost her husband at the beginning of this year to an acute hemorrhagic stroke. This is her second husband but they've been married for 10 years and she said that he was the love of her life. Overall she is doing well from a coronary standpoint. She denies any chest pain or worsening shortness of breath. Show stress test in November of last year which was negative for ischemia. She's had marked improvement in her cholesterol on Praluent, but her triglycerides remain elevated. Most of this is  likely related to elevated blood sugars but that is also being worked on. She is currently on Januvia.  11/09/2017  Carmen [name] returns for follow-up.  Recently she saw [facility] care wife for preoperative risk assessment for GYN surgery.  She had a uterine fibroid which was removed uneventfully.  She has marked dyslipidemia on Praluent and has been intolerant to statins.  Recent lipid profile showed total cholesterol 188, triglycerides 358 (on Lovaza), HDL 42 and LDL 74.  Overall reasonable lipid profile except for elevated triglycerides which percent residual risk.  We discussed the possibility of switching her over to [facility] when it obtains FDA approval in October.  Blood pressure is well controlled today.  She denies any chest pain or worsening shortness of breath.  Unfortunately her blood sugars been very poorly controlled, probably contributing to her elevated triglycerides.  PMHx:  Past Medical History:  Diagnosis Date  . Arthritis   . Asymptomatic PVCs   . Bilateral shoulder pain   . CAD (coronary artery disease)   . Cancer ([place]) 11/2015   melanomax4  right upper arm  . Chronic renal insufficiency, stage 3 (moderate) (HCC)   . Diabetes mellitus without complication ([place])   . Diverticulosis   . Endometrial polyp   . External hemorrhoids   . Gout   . Hemochromatosis, hereditary ([place]) 11/24/2012  . History of anemia   . History of duodenal ulcer 1990  . Hyperlipidemia   . Hypertension   . Internal hemorrhoids   . Jaundice    age 80 or 46  . Myocardial infarction ([place]) 2007  . Neuromuscular disorder ([place])    peripheral neuropathy  . PMB (postmenopausal bleeding)   . PONV (postoperative nausea and vomiting)   . Prolapse of female pelvic organs    uses pessary  . Seizures ([place])    had one at Dr. Antonieta Pert office after getting blood drawn  . Tick bite 08/12/2017   had 3 bites  . Vitamin D deficiency     Past Surgical History:  Procedure Laterality Date  . BREAST SURGERY      left breast lump--benign  . CARDIAC CATHETERIZATION  06/03/2005  . COLONOSCOPY  11/28/2001  . CORONARY ARTERY BYPASS GRAFT  2007   x2 Dr. Roxan Hockey, LIMA to LAD, SVG to PDA  . DILATATION & CURETTAGE/HYSTEROSCOPY WITH MYOSURE N/A 09/28/2017   Procedure: DILATATION & CURETTAGE/HYSTEROSCOPY WITH MYOSURE;  Surgeon: Molli Posey, MD;  Location: [facility];  Service: Gynecology;  Laterality: N/A;  . LIPOMA EXCISION     back  . UPPER GI ENDOSCOPY  01/20/1989    FAMHx:  Family History  Problem Relation Age of Onset  . Stroke Mother   . Hypertension Mother   . Neuropathy Mother   . Stroke Father   . Hypertension Father   . Diabetes Father   . Cancer Sister 51       sarcoma  . Diabetes Brother   . Arthritis Sister   . Arthritis  Sister   . Lupus Sister   . Hemachromatosis Sister   . Arthritis Sister   . Hemachromatosis Sister   . Hemachromatosis Sister   . Diverticulitis Sister   . Diabetes Brother   . Diabetes Brother   . Hemachromatosis Brother     SOCHx:   reports that she has never smoked. She has never used smokeless tobacco. She reports that she drinks alcohol. She reports that she does not use drugs.  ALLERGIES:  Allergies  Allergen Reactions  . Cymbalta [Duloxetine Hcl] Other (See Comments)    Profuse sweating  . Meloxicam Nausea And Vomiting  . Diclofenac Diarrhea  . Shellfish Allergy Other (See Comments)    Gout  . Tape     Causes skin irritation  . Crestor [Rosuvastatin] Other (See Comments)    Muscle aches on 5 mg daily and 10 mg twice weekly  . Lipitor [Atorvastatin] Other (See Comments)    Muscle aches  . Livalo [Pitavastatin] Other (See Comments)       . Sulfa Antibiotics Rash  . Zetia [Ezetimibe] Other (See Comments)    Muscle aches  . Zocor [Simvastatin] Other (See Comments)    Muscle aches     ROS: Pertinent items noted in HPI and remainder of comprehensive ROS otherwise negative.  HOME MEDS: Current Outpatient Medications   Medication Sig Dispense Refill  . pregabalin (LYRICA) 50 MG capsule Take 50 mg by mouth 2 (two) times daily.    Marland Kitchen allopurinol (ZYLOPRIM) 100 MG tablet TAKE (1) TABLET TWICE A DAY. 180 tablet 1  . aspirin 81 MG tablet Take 81 mg by mouth daily.    . colchicine 0.6 MG tablet TAKE (1) TABLET DAILY AS NEEDED. 30 tablet 1  . fluticasone (FLONASE) 50 MCG/ACT nasal spray SPRAY 1 SPRAY IN EACH NOSTRIL ONCE DAILY. (Patient taking differently: Place 2 sprays into both nostrils as needed. ) 16 g 4  . glucose blood (ONE TOUCH ULTRA TEST) test strip CHECK BLOOD SUGAR 2 TIMES A DAY 100 each prn  . Insulin Glargine (BASAGLAR KWIKPEN) 100 UNIT/ML SOPN Inject 0.05 mLs (5 Units total) into the skin at bedtime. Until Blood glucose is less than 200 (Patient taking differently: Inject 5 Units into the skin as needed. Until Blood glucose is less than 200) 1 pen 0  . Lancets Misc. (ACCU-CHEK FASTCLIX LANCET) KIT Use to check Blood Sugars 1 kit 0  . metoprolol tartrate (LOPRESSOR) 25 MG tablet TAKE (1) TABLET TWICE A DAY. 180 tablet 1  . olmesartan (BENICAR) 20 MG tablet Take 1 tablet (20 mg total) by mouth daily. 90 tablet 1  . omega-3 acid ethyl esters (LOVAZA) 1 g capsule TAKE (2) CAPSULES TWICE DAILY. 360 capsule 1  . PRALUENT 75 MG/ML SOPN INJECT 75 MG UNDER THE SKIN EVERY 14 DAYS 1 mL 1  . sitaGLIPtin (JANUVIA) 100 MG tablet Take 1 tablet (100 mg total) by mouth daily. 90 tablet 1  . triamterene-hydrochlorothiazide (DYAZIDE) 37.5-25 MG capsule TAKE (1) CAPSULE DAILY 90 capsule 1  . Vitamin D, Ergocalciferol, (DRISDOL) 50000 units CAPS capsule Take 1 capsule (50,000 Units total) by mouth 2 (two) times a week. 26 capsule 3  . VOLTAREN 1 % GEL APPLY 4 GRAMS TO AFFECTED AREA(S) 4 TIMES A DAY AS NEEDED 100 g 2   No current facility-administered medications for this visit.     LABS/IMAGING: No results found for this or any previous visit (from the past 48 hour(s)). Dg Wrfm Dexa  Result Date: 11/08/2017 EXAM: DUAL  X-RAY ABSORPTIOMETRY (DXA) FOR BONE MINERAL DENSITY IMPRESSION: Patient: Carmen [name] Referring Physician: Claretta Fraise Birth Date: May 16, 1940 [age] Patient ID: [id number] Height: 63.0 in. Weight: 192.0 lbs. Measured: 11/08/2017 2:19:56 PM (17 SP 1) Sex: Female Ethnicity: White Analyzed: 11/08/2017 2:29:03 PM (17 SP 1) FRAX* 10-year Probability of Fracture Based on femoral neck BMD: DualFemur (Right) Major Osteoporotic Fracture: 11.3% Hip Fracture:                2.3% Population:                  [place] (Caucasian) Risk Factors:                None *FRAX is a Materials engineer of the [employer] for Metabolic Bone Disease, a World Pharmacologist (WHO) [facility]. Dear Claretta Fraise, Your patient Carmen [name] completed a BMD test on 11/08/2017 using the [place] (software version: 17 SP 1) manufactured by [employer]. The following summarizes the results of our evaluation. PATIENT BIOGRAPHICAL: Name: Carmen [name], Carmen [name] Patient ID:  [id number] Birth Date: 07/02/1940 Height:     63.0 in. Gender:      Female    Exam Date:  11/08/2017 Weight:     192.0 lbs. Indications: Caucasian Fractures: Treatments: Thiazide, Vitamin D DENSITOMETRY RESULTS: Site Region Meas'd Date Meas'd Age WHO Class. YA T-score BMD %Chg Prev. Sig. Chg (*) AP Spine L1-L2 11/08/2017 77.4 years Normal 0.8 1.272 g/cm2 7.1% * AP Spine L1-L2 08/08/2014 74.1 years Normal 0.1 1.188 g/cm2 - DualFemur Neck Right 11/08/2017 77.4 years Osteopenia -1.4 0.840 g/cm2 -10.6% * DualFemur Neck Right 08/08/2014 74.1 [age] Normal -0.7 0.940 g/cm2 - DualFemur Total Mean 11/08/2017 77.4 years Normal 0.1 1.019 g/cm2 -2.3% DualFemur Total Mean 08/08/2014 74.1 [age] Normal 0.3 1.043 g/cm2 - - ASSESSMENT: The BMD measured at Femur Neck Right is 0.840 g/cm2 with a T-score of -1.4. This patient is considered osteopenic according to [facility]) criteria.  Compared with the prior study on 08/08/2014 ,the BMD of the femoral neck shows a statistically significant decrease).The scan quality is good. Exclusion : L- 3,4 was not utilized due to degenerative changes. . - World Health Organization [facility]) criteria for post-menopausal, Caucasian Women: Normal:       T-score at or above -1 SD Osteopenia:   T-score between -1 and -2.5 SD Osteoporosis: T-score at or below -2.5 SD - RECOMMENDATIONS: 1. All patients should optimize calcium and vitamin D intake. 2. Consider FDA-approved medical therapies in postmenopausal women and men aged 49 years and older, based on the following: a. Hip or vertebral (clinical or morphometric ) fracture. b. T-score < -2.5 at the femoral neck or spine after appropriate evaluation to exclude secondary causes c.Low bone mass (T-score between -1.0 and -2.5 at the femoral neck or spine) and a 10 -year probability of a hip fracture > 3% or a 10- year probability of a major osteoporosis- related fracture > 20% based on the [place]-adapted WHO algorithm d. Clinician judgment and/or patient preferences may inidcate treatment for people with 10 -year fracture probabilities above or below these levels. - FOLLOW-UP: People with diagnosed cases of osteoporosis or at high risk for fracture should have regular bone mineral density tests. For patients eligible for Medicare, routine testing is allowed once every 2 years. The testing frequency can be increased to one year for patients who have rapidly progressing disease, those who  are receiving or discontinuing medical therapy to restore bone mass, or have additional risk factors. I have reviewed this report, and agree with the above findings. [facility] Radiology - FRAX* RESULTS:  (version: 3.8) 10-year Probability of Fracture1 Major Osteoporotic Fracture2 Hip Fracture 11.3%                        2.3% Population:                  [place] (Caucasian) Risk Factors:                None Based on DualFemur (Right) Neck BMD 1  -The 10-year probability of fracture may be lower than reported if the patient has received treatment. 2 -Major Osteoporotic Fracture: Clinical Spine, Forearm, Hip or Shoulder *FRAX is a Materials engineer of the [employer] for Metabolic Bone Disease, a [place] (WHO) [facility]. I have reviewed this report, and agree with the above findings. [facility] Radiology Electronically Signed   By: Lovey Newcomer M.D.   On: 11/08/2017 14:40    VITALS: BP 126/71   Pulse (!) 59   Ht _0  (1.6 m)   Wt 191 lb 12.8 oz (87 kg)   BMI 33.98 kg/m   EXAM: General appearance: alert, no distress and mildly obese Neck: no carotid bruit and no JVD Lungs: clear to auscultation bilaterally Heart: regular rate and rhythm, S1, S2 normal, no murmur, click, rub or gallop Abdomen: soft, non-tender; bowel sounds normal; no masses,  no organomegaly Extremities: extremities normal, atraumatic, no cyanosis or edema and venous stasis dermatitis noted Pulses: 2+ and symmetric Skin: Skin color, texture, turgor normal. No rashes or lesions Neurologic: Grossly normal Psych: Pleasant  EKG: Deferred  ASSESSMENT: 1. Coronary artery bypass grafting x2 vessels with LIMA to LAD and SVG to PDA in 2007 - negative myoview in 01/2016 2. Asymptomatic PVCs 3. Hypertension-controlled 4. Dyslipidemia - intolerant to all statins and zetia, markedly improved on Praluent 5. Diabetes 6. Gout   PLAN: 1.   Carmen [name] is asymptomatic and recently underwent surgery without complications.  Her blood pressure is well controlled.  Her diabetes is poorly controlled and she is working with her primary care on that.  Her dyslipidemia is improved on Praluent but has elevated triglycerides despite Lovaza.  Recent evidence on Lovaza suggested may be possibly harmful as the DHA can raise LDL cholesterol and DHA is also associated with membrane disruption.  She may be a good candidate to switch to  Vascepa, based on the reduce-IT trial data, pending FDA approval later this year.  We will plan a repeat lipid profile in November and adjust medications accordingly.  Pixie Casino, MD, [facility], [place] Director of the Advanced Lipid Disorders &  Cardiovascular Risk Reduction Clinic Diplomate of the American Board of Clinical Lipidology Attending Cardiologist  Direct Dial: [phone]  Fax: [phone]  Website:  [email or web address] [name] 11/09/2017, 2:05 PM

## 2017-11-09 NOTE — Progress Notes (Signed)
Subjective:   Carmen [name] is a 77 y.o. female who presents for Medicare Annual (Subsequent) preventive examination.  Carmen [name] is retired from [employer].  She enjoys gardening and working around her home.  She attends church regularly, and goes out to meals with her daughters at least once weekly.  She has 2 daughters who live locally; and 1 daughter in [place].  She also has 4 grandchildren.  Her husband passed away 19 months ago due to a stroke.  She feels she is feeling better emotionally than she had since he passed away.  She lives alone, but sees her family often.  She has a vacation planned with them in September.  Carmen [name] feels her health is about the same as it was last year, except she has bilateral frozen shoulders that she has done physical therapy and exercises at home to rehab.  She states her shoulders are improving.  She has had no hospitalizations, ER visits or surgeries in the past year.    Review of Systems:   All negative today Cardiac Risk Factors include: diabetes mellitus;advanced age (>35mn, >>60women);hypertension     Objective:     Vitals: BP 134/79   Pulse 85   Wt 192 lb (87.1 kg)   BMI 34.01 kg/m   Body mass index is 34.01 kg/m.  Advanced Directives 11/08/2017 09/28/2017 07/28/2017 05/03/2017 01/22/2017 10/22/2016 06/18/2016  Does Patient Have a Medical Advance Directive? No No No No No No No  Would patient like information on creating a medical advance directive? Yes (MAU/Ambulatory/Procedural Areas - Information given) Yes (MAU/Ambulatory/Procedural Areas - Information given) - - - - -    Tobacco Social History   Tobacco Use  Smoking Status Never Smoker  Smokeless Tobacco Never Used  Tobacco Comment   never used tobacco     Counseling given: No Comment: never used tobacco   Clinical Intake:     Pain Score: 0-No pain                 Past Medical History:  Diagnosis Date  . Arthritis   . Asymptomatic PVCs    . Bilateral shoulder pain   . CAD (coronary artery disease)   . [place] ([place] 11/2015   melanomax4  right upper arm  . Chronic renal insufficiency, stage 3 (moderate) (HCC)   . Diabetes mellitus without complication ([facility]   . Diverticulosis   . Endometrial polyp   . External hemorrhoids   . Gout   . Hemochromatosis, hereditary ([facility] 11/24/2012  . History of anemia   . History of duodenal ulcer 1990  . Hyperlipidemia   . Hypertension   . Internal hemorrhoids   . Jaundice    age [age]or 89 . Myocardial infarction ([facility] 2007  . Neuromuscular disorder ([facility]    peripheral neuropathy  . PMB (postmenopausal bleeding)   . PONV (postoperative nausea and vomiting)   . Prolapse of female pelvic organs    uses pessary  . Seizures ([facility]    had one at Dr. EAntonieta Pertoffice after getting blood drawn  . Tick bite 08/12/2017   had 3 bites  . Vitamin D deficiency    Past Surgical History:  Procedure Laterality Date  . BREAST SURGERY     left breast lump--benign  . CARDIAC CATHETERIZATION  06/03/2005  . COLONOSCOPY  11/28/2001  . CORONARY ARTERY BYPASS GRAFT  2007   x2 Dr. HRoxan Hockey LIMA to LAD, SVG to PDA  . DILATATION & CURETTAGE/HYSTEROSCOPY  WITH MYOSURE N/A 09/28/2017   Procedure: DILATATION & CURETTAGE/HYSTEROSCOPY WITH MYOSURE;  Surgeon: Molli Posey, MD;  Location: [facility];  Service: Gynecology;  Laterality: N/A;  . LIPOMA EXCISION     back  . UPPER GI ENDOSCOPY  01/20/1989   Family History  Problem Relation Age of Onset  . Stroke Mother   . Hypertension Mother   . Neuropathy Mother   . Stroke Father   . Hypertension Father   . Diabetes Father   . Cancer Sister 5       sarcoma  . Diabetes Brother   . Arthritis Sister   . Arthritis Sister   . Lupus Sister   . Hemachromatosis Sister   . Arthritis Sister   . Hemachromatosis Sister   . Hemachromatosis Sister   . Diverticulitis Sister   . Diabetes Brother   . Diabetes Brother   . Hemachromatosis  Brother    Social History   Socioeconomic History  . Marital status: Married    Spouse name: Not on file  . Number of children: Not on file  . Years of education: Not on file  . Highest education level: Not on file  Occupational History  . Not on file  Social Needs  . Financial resource strain: Not on file  . Food insecurity:    Worry: Not on file    Inability: Not on file  . Transportation needs:    Medical: Not on file    Non-medical: Not on file  Tobacco Use  . Smoking status: Never Smoker  . Smokeless tobacco: Never Used  . Tobacco comment: never used tobacco  Substance and Sexual Activity  . Alcohol use: Yes    Alcohol/week: 0.0 standard drinks    Comment: rare wine  . Drug use: No  . Sexual activity: Not Currently    Birth control/protection: Post-menopausal  Lifestyle  . Physical activity:    Days per week: Not on file    Minutes per session: Not on file  . Stress: Not on file  Relationships  . Social connections:    Talks on phone: Not on file    Gets together: Not on file    Attends religious service: Not on file    Active member of club or organization: Not on file    Attends meetings of clubs or organizations: Not on file    Relationship status: Not on file  Other Topics Concern  . Not on file  Social History Narrative  . Not on file    Outpatient Encounter Medications as of 11/08/2017  Medication Sig  . allopurinol (ZYLOPRIM) 100 MG tablet TAKE (1) TABLET TWICE A DAY.  Marland Kitchen aspirin 81 MG tablet Take 81 mg by mouth daily.  . colchicine 0.6 MG tablet TAKE (1) TABLET DAILY AS NEEDED.  . fluticasone (FLONASE) 50 MCG/ACT nasal spray SPRAY 1 SPRAY IN EACH NOSTRIL ONCE DAILY. (Patient taking differently: Place 2 sprays into both nostrils as needed. )  . glucose blood (ONE TOUCH ULTRA TEST) test strip CHECK BLOOD SUGAR 2 TIMES A DAY  . Insulin Glargine (BASAGLAR KWIKPEN) 100 UNIT/ML SOPN Inject 0.05 mLs (5 Units total) into the skin at bedtime. Until Blood  glucose is less than 200 (Patient taking differently: Inject 5 Units into the skin as needed. Until Blood glucose is less than 200)  . Lancets Misc. (ACCU-CHEK FASTCLIX LANCET) KIT Use to check Blood Sugars  . LYRICA 75 MG capsule TAKE  (1)  CAPSULE  TWICE DAILY.  Marland Kitchen  metoprolol tartrate (LOPRESSOR) 25 MG tablet TAKE (1) TABLET TWICE A DAY.  Marland Kitchen olmesartan (BENICAR) 20 MG tablet Take 1 tablet (20 mg total) by mouth daily.  Marland Kitchen omega-3 acid ethyl esters (LOVAZA) 1 g capsule TAKE (2) CAPSULES TWICE DAILY.  Marland Kitchen PRALUENT 75 MG/ML SOPN INJECT 75 MG UNDER THE SKIN EVERY 14 DAYS  . sitaGLIPtin (JANUVIA) 100 MG tablet Take 1 tablet (100 mg total) by mouth daily.  Marland Kitchen triamterene-hydrochlorothiazide (DYAZIDE) 37.5-25 MG capsule TAKE (1) CAPSULE DAILY  . Vitamin D, Ergocalciferol, (DRISDOL) 50000 units CAPS capsule Take 1 capsule (50,000 Units total) by mouth 2 (two) times a week.  . VOLTAREN 1 % GEL APPLY 4 GRAMS TO AFFECTED AREA(S) 4 TIMES A DAY AS NEEDED   No facility-administered encounter medications on file as of 11/08/2017.     Activities of Daily Living In your present state of health, do you have any difficulty performing the following activities: 11/08/2017 09/28/2017  Hearing? Y Y  Comment Bilateral hearing aids bil hearing aids in pocket book not in  Vision? N N  Difficulty concentrating or making decisions? N N  Walking or climbing stairs? N Y  Comment - sometimes since heart attack in 2007  Dressing or bathing? N N  Doing errands, shopping? N -  Preparing Food and eating ? N -  Using the Toilet? N -  In the past six months, have you accidently leaked urine? N -  Do you have problems with loss of bowel control? N -  Managing your Medications? N -  Managing your Finances? N -  Housekeeping or managing your Housekeeping? N -  Some recent data might be hidden    Patient Care Team: Claretta Fraise, MD as PCP - General (Family Medicine) Marin Olp Rudell Cobb, MD as Consulting Physician  (Oncology) Molli Posey, MD as Consulting Physician (Obstetrics and Gynecology) Pixie Casino, MD as Consulting Physician (Cardiology)    Assessment:   This is a routine wellness examination for Carmen [name].  Exercise Activities and Dietary recommendations  Carmen [name] states she usually has 3 meals per day.  Usually coffee and fruit for breakfast, sandwich and vegetables for lunch, and meat and vegetables for supper.  Recommended a diet of mostly lean proteins, vegetables, and fruits and whole grains in moderation.  Healthiest cooking methods discussed- baked, broiled, grilled sauteed.  Patient states that since a fairly recent course of prednisone her blood glucose levels have been elevated.  She is working to reduce her carbohydrate intake to decrease hemoglobin A1c.  Current Exercise Habits: Home exercise routine, Type of exercise: stretching, Time (Minutes): 20, Frequency (Times/Week): 5, Weekly Exercise (Minutes/Week): 100, Intensity: Mild  Goals    . HEMOGLOBIN A1C < 7.0     Decrease carbohydrate intake     . LDL CALC < 70       Fall Risk Fall Risk  11/08/2017 10/20/2017 06/18/2017 04/27/2017 03/08/2017  Falls in the past year? Yes No No No No  Number falls in past yr: 1 - - - -  Injury with Fall? No - - - -  Follow up Education provided;Falls prevention discussed - - - -   Is the patient's home free of loose throw rugs in walkways, pet beds, electrical cords, etc?   no - patient states she has throw rugs with non skid mats underneath.  Advised these can still be tripping hazard.      Grab bars in the bathroom? yes      Handrails on the stairs?  yes      Adequate lighting?   yes   Depression Screen PHQ 2/9 Scores 11/08/2017 10/20/2017 06/18/2017 04/27/2017  PHQ - 2 Score 0 0 0 0  PHQ- 9 Score - - - -     Cognitive Function MMSE - Mini Mental State Exam 11/08/2017 08/08/2014  Orientation to time 5 5  Orientation to Place 5 5  Registration 3 3  Attention/ Calculation 5 5   Recall 3 3  Language- name 2 objects 2 2  Language- repeat 1 1  Language- follow 3 step command 3 3  Language- read & follow direction 1 1  Write a sentence 1 1  Copy design 1 1  Total score 30 30        Immunization History  Administered Date(s) Administered  . Influenza, High Dose Seasonal PF 12/09/2012, 12/26/2013  . Influenza,inj,Quad PF,6+ Mos 01/01/2015  . Influenza,inj,quad, With Preservative 04/23/2017  . Influenza-Unspecified 12/21/2013, 01/21/2016  . Zoster 01/24/2015    Qualifies for Shingles Vaccine? Declined today  Screening Tests Health Maintenance  Topic Date Due  . OPHTHALMOLOGY EXAM  05/09/2015  . INFLUENZA VACCINE  10/21/2017  . TETANUS/TDAP  06/19/2018 (Originally 06/06/1959)  . PNA vac Low Risk Adult (1 of 2 - PCV13) 06/19/2018 (Originally 06/05/2005)  . MAMMOGRAM  02/10/2018  . HEMOGLOBIN A1C  03/19/2018  . FOOT EXAM  09/18/2018  . DEXA SCAN  Completed   Patient plans to schedule eye exam with [facility] in the next couple of months.  Tdap and Prevnar declined today.  She states she thinks [facility] [place], vaccine not in [place].  [facility]ed [facility] [place], they do not have record of patient getting Prevnar there.  Recommend patient get Prevnar at next office visit.    Cancer Screenings: Lung: Low Dose CT Chest recommended if Age 81-80 years, 30 pack-year currently smoking OR have quit w/in 15years. Patient does not qualify. Breast:  Up to date on Mammogram? Yes   Up to date of Bone Density/Dexa? Yes Colorectal: Not indicated  Additional Screenings:  Hepatitis C Screening:  Not indicated     Plan:     Remember to schedule eye exam. Review information given on Advanced Directives, and if completed please bring a copy to our office to be filed in your medical record.  I have personally reviewed and noted the following in the patient's chart:   . Medical and social history . Use of alcohol, tobacco or  illicit drugs  . Current medications and supplements . Functional ability and status . Nutritional status . Physical activity . Advanced directives . List of other physicians . Hospitalizations, surgeries, and ER visits in previous 12 months . Vitals . Screenings to include cognitive, depression, and falls . Referrals and appointments  In addition, I have reviewed and discussed with patient certain preventive protocols, quality metrics, and best practice recommendations. A written personalized care plan for preventive services as well as general preventive health recommendations were provided to patient.     Carita Sollars M, RN  11/09/2017  I have reviewed and agree with the above AWV documentation.  Claretta Fraise, M.D.

## 2017-11-25 ENCOUNTER — Other Ambulatory Visit: Payer: Self-pay | Admitting: Family Medicine

## 2017-11-25 DIAGNOSIS — H40033 Anatomical narrow angle, bilateral: Secondary | ICD-10-CM | POA: Diagnosis not present

## 2017-11-25 DIAGNOSIS — E119 Type 2 diabetes mellitus without complications: Secondary | ICD-10-CM | POA: Diagnosis not present

## 2017-11-25 LAB — HM DIABETES EYE EXAM

## 2017-11-26 NOTE — Telephone Encounter (Signed)
Last seen 09/17/17   Dr Livia Snellen

## 2017-12-09 ENCOUNTER — Other Ambulatory Visit: Payer: Self-pay | Admitting: Family Medicine

## 2017-12-20 ENCOUNTER — Ambulatory Visit (INDEPENDENT_AMBULATORY_CARE_PROVIDER_SITE_OTHER): Payer: Medicare Other | Admitting: Family Medicine

## 2017-12-20 ENCOUNTER — Encounter: Payer: Self-pay | Admitting: Family Medicine

## 2017-12-20 VITALS — BP 122/65 | HR 68 | Temp 97.5°F | Ht 63.0 in | Wt 196.0 lb

## 2017-12-20 DIAGNOSIS — E782 Mixed hyperlipidemia: Secondary | ICD-10-CM | POA: Diagnosis not present

## 2017-12-20 DIAGNOSIS — I10 Essential (primary) hypertension: Secondary | ICD-10-CM | POA: Diagnosis not present

## 2017-12-20 DIAGNOSIS — E1140 Type 2 diabetes mellitus with diabetic neuropathy, unspecified: Secondary | ICD-10-CM

## 2017-12-20 DIAGNOSIS — E1159 Type 2 diabetes mellitus with other circulatory complications: Secondary | ICD-10-CM | POA: Diagnosis not present

## 2017-12-20 LAB — BAYER DCA HB A1C WAIVED: HB A1C: 7.4 % — AB (ref <7.0)

## 2017-12-20 NOTE — Progress Notes (Signed)
Subjective:  Patient ID: Carmen [name], female    DOB: Nov 15, 1940  Age: 77 y.o. MRN: [id number]  CC: Medical Management of Chronic Issues   HPI Carmen [name] presents forFollow-up of diabetes. Patient checks blood sugar at home.  139 fasting and 225 postprandial Patient denies symptoms such as polyuria, polydipsia, excessive hunger, nausea No significant hypoglycemic spells noted. Medications reviewed. Pt reports taking glargine causes a bruise on her belly so she does not want to take it.  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  She likes to Praluent for cholesterol.  It does not leave a bruise and she does not ever have to actually see the needle because of the way the cartridge is set up.  Checking feet daily. Patient in for follow-up of elevated cholesterol. Doing well without complaints on current medication. Denies side effects  Currently no chest pain, shortness of breath or other cardiovascular related symptoms noted.  History Carmen [name] has a past medical history of Arthritis, Asymptomatic PVCs, Bilateral shoulder pain, CAD (coronary artery disease), Cancer ([place]) (11/2015), Chronic renal insufficiency, stage 3 (moderate) ([place]), Diabetes mellitus without complication ([place]), Diverticulosis, Endometrial polyp, External hemorrhoids, Gout, Hemochromatosis, hereditary ([place]) (11/24/2012), History of anemia, History of duodenal ulcer (1990), Hyperlipidemia, Hypertension, Internal hemorrhoids, Jaundice, Myocardial infarction ([employer]) (2007), Neuromuscular disorder ([place]), PMB (postmenopausal bleeding), PONV (postoperative nausea and vomiting), Prolapse of female pelvic organs, Seizures ([place]), Tick bite (08/12/2017), and Vitamin D deficiency.   She has a past surgical history that includes Lipoma excision; Breast surgery; Cardiac catheterization (06/03/2005); Colonoscopy (11/28/2001); Upper gi endoscopy (01/20/1989); Coronary artery bypass graft (2007); and Dilatation & curettage/hysteroscopy with  myosure (N/A, 09/28/2017).   Her family history includes Arthritis in her sister, sister, and sister; Cancer (age of onset: 37) in her sister; Diabetes in her brother, brother, brother, and father; Diverticulitis in her sister; Hemachromatosis in her brother, sister, sister, and sister; Hypertension in her father and mother; Lupus in her sister; Neuropathy in her mother; Stroke in her father and mother.She reports that she has never smoked. She has never used smokeless tobacco. She reports that she drinks alcohol. She reports that she does not use drugs.  Current Outpatient Medications on File Prior to Visit  Medication Sig Dispense Refill  . allopurinol (ZYLOPRIM) 100 MG tablet TAKE (1) TABLET TWICE A DAY. 180 tablet 1  . aspirin 81 MG tablet Take 81 mg by mouth daily.    . colchicine 0.6 MG tablet TAKE (1) TABLET DAILY AS NEEDED. 30 tablet 1  . fluticasone (FLONASE) 50 MCG/ACT nasal spray SPRAY 1 SPRAY IN EACH NOSTRIL ONCE DAILY. (Patient taking differently: Place 2 sprays into both nostrils as needed. ) 16 g 4  . glucose blood (ONE TOUCH ULTRA TEST) test strip CHECK BLOOD SUGAR 2 TIMES A DAY 100 each prn  . Insulin Glargine (BASAGLAR KWIKPEN) 100 UNIT/ML SOPN Inject 0.05 mLs (5 Units total) into the skin at bedtime. Until Blood glucose is less than 200 (Patient taking differently: Inject 5 Units into the skin as needed. Until Blood glucose is less than 200) 1 pen 0  . Lancets Misc. (ACCU-CHEK FASTCLIX LANCET) KIT Use to check Blood Sugars 1 kit 0  . metoprolol tartrate (LOPRESSOR) 25 MG tablet TAKE (1) TABLET TWICE A DAY. 180 tablet 1  . olmesartan (BENICAR) 20 MG tablet Take 1 tablet (20 mg total) by mouth daily. 90 tablet 1  . omega-3 acid ethyl esters (LOVAZA) 1 g capsule TAKE (2) CAPSULES TWICE DAILY. 360 capsule 1  .  PRALUENT 75 MG/ML SOPN INJECT 75 MG UNDER THE SKIN EVERY 14 DAYS 1 mL 1  . pregabalin (LYRICA) 50 MG capsule Take 50 mg by mouth 2 (two) times daily.    . pregabalin (LYRICA) 50  MG capsule TAKE (1) CAPSULE TWICE DAILY. 60 capsule 1  . sitaGLIPtin (JANUVIA) 100 MG tablet Take 1 tablet (100 mg total) by mouth daily. 90 tablet 1  . triamterene-hydrochlorothiazide (DYAZIDE) 37.5-25 MG capsule TAKE (1) CAPSULE DAILY 90 capsule 1  . Vitamin D, Ergocalciferol, (DRISDOL) 50000 units CAPS capsule Take 1 capsule (50,000 Units total) by mouth 2 (two) times a week. 26 capsule 3  . VOLTAREN 1 % GEL APPLY 4 GRAMS TO AFFECTED AREA(S) 4 TIMES A DAY AS NEEDED 100 g 2   No current facility-administered medications on file prior to visit.     ROS Review of Systems  Constitutional: Negative.   HENT: Negative for congestion.   Eyes: Negative for visual disturbance.  Respiratory: Negative for shortness of breath.   Cardiovascular: Negative for chest pain.  Gastrointestinal: Negative for abdominal pain, constipation, diarrhea, nausea and vomiting.  Genitourinary: Negative for difficulty urinating.  Musculoskeletal: Negative for arthralgias and myalgias.  Neurological: Negative for headaches.  Psychiatric/Behavioral: Negative for sleep disturbance.    Objective:  BP 122/65   Pulse 68   Temp (!) 97.5 F (36.4 C) (Oral)   Ht 5' 3" (1.6 m)   Wt 196 lb (88.9 kg)   BMI 34.72 kg/m   BP Readings from Last 3 Encounters:  12/20/17 122/65  11/09/17 126/71  11/08/17 134/79    Wt Readings from Last 3 Encounters:  12/20/17 196 lb (88.9 kg)  11/09/17 191 lb 12.8 oz (87 kg)  11/08/17 192 lb (87.1 kg)     Physical Exam  Constitutional: She is oriented to person, place, and time. She appears well-developed and well-nourished. No distress.  HENT:  Head: Normocephalic and atraumatic.  Right Ear: External ear normal.  Left Ear: External ear normal.  Nose: Nose normal.  Mouth/Throat: Oropharynx is clear and moist.  Eyes: Pupils are equal, round, and reactive to light. Conjunctivae and EOM are normal.  Neck: Normal range of motion. Neck supple. No thyromegaly present.    Cardiovascular: Normal rate, regular rhythm and normal heart sounds.  No murmur heard. Pulmonary/Chest: Effort normal and breath sounds normal. No respiratory distress. She has no wheezes. She has no rales.  Abdominal: Soft. Bowel sounds are normal. She exhibits no distension. There is no tenderness.  Lymphadenopathy:    She has no cervical adenopathy.  Neurological: She is alert and oriented to person, place, and time. She has normal reflexes.  Skin: Skin is warm and dry.  Psychiatric: She has a normal mood and affect. Her behavior is normal. Judgment and thought content normal.      Assessment & Plan:   Carmen [name] was seen today for medical management of chronic issues.  Diagnoses and all orders for this visit:  Controlled type 2 diabetes mellitus with diabetic neuropathy, without long-term current use of insulin (HCC) -     CBC with Differential/Platelet -     CMP14+EGFR -     Bayer DCA Hb A1c Waived  Hypertension associated with diabetes ([place]) -     CBC with Differential/Platelet -     CMP14+EGFR  Mixed hyperlipidemia -     CBC with Differential/Platelet -     CMP14+EGFR -     Lipid panel      I am having Carmen E.  [name] maintain her aspirin, fluticasone, VOLTAREN, ACCU-CHEK FASTCLIX LANCET, allopurinol, colchicine, glucose blood, sitaGLIPtin, metoprolol tartrate, olmesartan, triamterene-hydrochlorothiazide, Vitamin D (Ergocalciferol), BASAGLAR KWIKPEN, PRALUENT, pregabalin, pregabalin, and omega-3 acid ethyl esters.  Due to needle phobia the patient is requesting to be taken off of insulin.  I told her that once her A1c comes back tomorrow I would see what I can do.  It is also based on kidney function.   Follow-up: Return in about 3 months (around 03/21/2018).  Claretta Fraise, M.D.

## 2017-12-20 NOTE — Patient Instructions (Signed)
Type 2 Diabetes Mellitus, Diagnosis, Adult Type 2 diabetes (type 2 diabetes mellitus) is a long-term (chronic) disease. It may be caused by one or both of these problems:  Your body does not make enough of a hormone called insulin.  Your body does not react in a normal way to insulin that it makes.  Insulin lets sugars (glucose) go into cells in the body. This gives you energy. If you have type 2 diabetes, sugars cannot get into cells. This causes high blood sugar (hyperglycemia). Your doctor will set treatment goals for you. Generally, you should have these blood sugar levels:  Before meals (preprandial): 80-130 mg/dL (4.4-7.2 mmol/L).  After meals (postprandial): below 180 mg/dL (10 mmol/L).  A1c (hemoglobin A1c) level: less than 7%.  Follow these instructions at home: Questions to Ask Your Doctor  You may want to ask these questions:  Do I need to meet with a diabetes educator?  Where can I find a support group for people with diabetes?  What equipment will I need to care for myself at home?  What diabetes medicines do I need? When should I take them?  How often do I need to check my blood sugar?  What number can I call if I have questions?  When is my next doctor's visit?  General instructions  Take over-the-counter and prescription medicines only as told by your doctor.  Keep all follow-up visits as told by your doctor. This is important. Contact a doctor if:  Your blood sugar is at or above 240 mg/dL (13.3 mmol/L) for 2 days in a row.  You have been sick or have had a fever for 2 days or more and you are not getting better.  You have any of these problems for more than 6 hours: ? You cannot eat or drink. ? You feel sick to your stomach (nauseous). ? You throw up (vomit). ? You have watery poop (diarrhea). Get help right away if:  Your blood sugar is lower than 54 mg/dL (3 mmol/L).  You get confused.  You have trouble: ? Thinking  clearly. ? Breathing.  You have moderate or large ketone levels in your pee (urine). This information is not intended to replace advice given to you by your health care provider. Make sure you discuss any questions you have with your health care provider. Document Released: 12/17/2007 Document Revised: 08/15/2015 Document Reviewed: 04/12/2015 Elsevier Interactive Patient Education  2018 Elsevier Inc.   

## 2017-12-21 LAB — CBC WITH DIFFERENTIAL/PLATELET
BASOS ABS: 0.1 10*3/uL (ref 0.0–0.2)
Basos: 1 %
EOS (ABSOLUTE): 0.4 10*3/uL (ref 0.0–0.4)
Eos: 4 %
Hematocrit: 39.3 % (ref 34.0–46.6)
Hemoglobin: 13.6 g/dL (ref 11.1–15.9)
Immature Grans (Abs): 0.1 10*3/uL (ref 0.0–0.1)
Immature Granulocytes: 1 %
Lymphocytes Absolute: 3.9 10*3/uL — ABNORMAL HIGH (ref 0.7–3.1)
Lymphs: 41 %
MCH: 33 pg (ref 26.6–33.0)
MCHC: 34.6 g/dL (ref 31.5–35.7)
MCV: 95 fL (ref 79–97)
MONOS ABS: 0.6 10*3/uL (ref 0.1–0.9)
Monocytes: 6 %
Neutrophils Absolute: 4.7 10*3/uL (ref 1.4–7.0)
Neutrophils: 47 %
Platelets: 154 10*3/uL (ref 150–450)
RBC: 4.12 x10E6/uL (ref 3.77–5.28)
RDW: 13.9 % (ref 12.3–15.4)
WBC: 9.7 10*3/uL (ref 3.4–10.8)

## 2017-12-21 LAB — CMP14+EGFR
ALK PHOS: 81 IU/L (ref 39–117)
ALT: 26 IU/L (ref 0–32)
AST: 28 IU/L (ref 0–40)
Albumin/Globulin Ratio: 1.9 (ref 1.2–2.2)
Albumin: 4.5 g/dL (ref 3.5–4.8)
BUN/Creatinine Ratio: 15 (ref 12–28)
BUN: 22 mg/dL (ref 8–27)
Bilirubin Total: 0.3 mg/dL (ref 0.0–1.2)
CO2: 23 mmol/L (ref 20–29)
CREATININE: 1.42 mg/dL — AB (ref 0.57–1.00)
Calcium: 10 mg/dL (ref 8.7–10.3)
Chloride: 100 mmol/L (ref 96–106)
GFR calc Af Amer: 41 mL/min/{1.73_m2} — ABNORMAL LOW (ref >59)
GFR calc non Af Amer: 36 mL/min/{1.73_m2} — ABNORMAL LOW (ref >59)
GLUCOSE: 263 mg/dL — AB (ref 65–99)
Globulin, Total: 2.4 g/dL (ref 1.5–4.5)
Potassium: 5.2 mmol/L (ref 3.5–5.2)
SODIUM: 139 mmol/L (ref 134–144)
Total Protein: 6.9 g/dL (ref 6.0–8.5)

## 2017-12-21 LAB — LIPID PANEL
CHOLESTEROL TOTAL: 189 mg/dL (ref 100–199)
Chol/HDL Ratio: 5.1 ratio — ABNORMAL HIGH (ref 0.0–4.4)
HDL: 37 mg/dL — ABNORMAL LOW (ref >39)
TRIGLYCERIDES: 466 mg/dL — AB (ref 0–149)

## 2017-12-28 ENCOUNTER — Ambulatory Visit (INDEPENDENT_AMBULATORY_CARE_PROVIDER_SITE_OTHER): Payer: Medicare Other | Admitting: Family Medicine

## 2017-12-28 ENCOUNTER — Encounter: Payer: Self-pay | Admitting: Family Medicine

## 2017-12-28 VITALS — BP 139/71 | HR 69 | Temp 98.4°F | Ht 63.0 in | Wt 194.0 lb

## 2017-12-28 DIAGNOSIS — K1120 Sialoadenitis, unspecified: Secondary | ICD-10-CM

## 2017-12-28 DIAGNOSIS — H6501 Acute serous otitis media, right ear: Secondary | ICD-10-CM | POA: Diagnosis not present

## 2017-12-28 MED ORDER — AMOXICILLIN-POT CLAVULANATE 875-125 MG PO TABS: 1.0000 | ORAL_TABLET | Freq: Two times a day (BID) | ORAL | 0 refills | Status: DC

## 2017-12-28 NOTE — Progress Notes (Signed)
Chief Complaint  Patient presents with  . Pain in right side of jaw    Hard to open mouth    HPI  Patient presents today for 2 days of increasing pain at the right side of her jaw.  This extends from the angle of mandible to the preauricular area and up to the TMJ.  She says that it hurts inside her mouth at the corresponding area as well.  There is been no fever chills sweats.  No trauma.  There is some discomfort in the right ear but no change of hearing.  PMH: Smoking status noted ROS: Per HPI  Objective: BP 139/71   Pulse 69   Temp 98.4 F (36.9 C) (Oral)   Ht 5\' 3"  (1.6 m)   Wt 194 lb (88 kg)   BMI 34.37 kg/m  Gen: NAD, alert, cooperative with exam HEENT: NCAT, EOMI, PERRL.  There is tenderness for palpation of the right parotid gland with some fluid behind the right.  There is edema of the right preauricular face. CV: RRR, good S1/S2, no murmur Resp: CTABL, no wheezes, non-labored Neuro: Alert and oriented, No gross deficits  Assessment and plan:  1. Parotitis not due to mumps   2. Non-recurrent acute serous otitis media of right ear     Meds ordered this encounter  Medications  . amoxicillin-clavulanate (AUGMENTIN) 875-125 MG tablet    Sig: Take 1 tablet by mouth 2 (two) times daily.    Dispense:  20 tablet    Refill:  0    No orders of the defined types were placed in this encounter.   Follow up as needed.  Claretta Fraise, MD

## 2018-01-11 ENCOUNTER — Other Ambulatory Visit: Payer: Self-pay

## 2018-01-11 ENCOUNTER — Other Ambulatory Visit: Payer: Self-pay | Admitting: Family Medicine

## 2018-01-11 MED ORDER — ALIROCUMAB 75 MG/ML ~~LOC~~ SOPN: 75.0000 mg | PEN_INJECTOR | SUBCUTANEOUS | 1 refills | Status: DC

## 2018-01-25 ENCOUNTER — Other Ambulatory Visit: Payer: Self-pay | Admitting: Family Medicine

## 2018-03-09 ENCOUNTER — Other Ambulatory Visit: Payer: Self-pay | Admitting: *Deleted

## 2018-03-09 MED ORDER — ALIROCUMAB 75 MG/ML ~~LOC~~ SOAJ: 75.0000 mg | SUBCUTANEOUS | 0 refills | Status: DC

## 2018-03-18 ENCOUNTER — Ambulatory Visit (INDEPENDENT_AMBULATORY_CARE_PROVIDER_SITE_OTHER): Payer: Medicare Other | Admitting: *Deleted

## 2018-03-18 DIAGNOSIS — Z23 Encounter for immunization: Secondary | ICD-10-CM | POA: Diagnosis not present

## 2018-03-30 DIAGNOSIS — D225 Melanocytic nevi of trunk: Secondary | ICD-10-CM | POA: Diagnosis not present

## 2018-03-30 DIAGNOSIS — D2262 Melanocytic nevi of left upper limb, including shoulder: Secondary | ICD-10-CM | POA: Diagnosis not present

## 2018-03-30 DIAGNOSIS — L570 Actinic keratosis: Secondary | ICD-10-CM | POA: Diagnosis not present

## 2018-03-30 DIAGNOSIS — Z85828 Personal history of other malignant neoplasm of skin: Secondary | ICD-10-CM | POA: Diagnosis not present

## 2018-03-30 DIAGNOSIS — D2261 Melanocytic nevi of right upper limb, including shoulder: Secondary | ICD-10-CM | POA: Diagnosis not present

## 2018-03-30 DIAGNOSIS — Z85820 Personal history of malignant melanoma of skin: Secondary | ICD-10-CM | POA: Diagnosis not present

## 2018-03-31 ENCOUNTER — Telehealth: Payer: Self-pay | Admitting: Internal Medicine

## 2018-03-31 NOTE — Telephone Encounter (Signed)
° ° °  Pt c/o swelling: STAT is pt has developed SOB within 24 hours  1) How much weight have you gained and in what time span? N/A  2) If swelling, where is the swelling located? LEGS, ANKLES  3) Are you currently taking a fluid pill? YES  4) Are you currently SOB? NO  5) Do you have a log of your daily weights (if so, list)? N/A  6) Have you gained 3 pounds in a day or 5 pounds in a week? NO  7) Have you traveled recently? NO

## 2018-03-31 NOTE — Telephone Encounter (Signed)
Spoke with pt, the swelling in her feet and legs has been off and on for a while now. She has not been out walking as much as usual and has noticed an increase in swelling. There is no swelling in the morning but by lunch time they start swelling. She has neuropathy in her feet as well, there is no pain or SOB unless climbing stairs or going up hill which is normal for her. She watches her sodium and fluids. She was encouraged to elevate her legs when sitting and encouraged her to get compression hose. She has a follow up with dr Debara Pickett 04-27-18. Will forward to dr hilty to review and advise.

## 2018-03-31 NOTE — Telephone Encounter (Signed)
Spoke with pt, aware of dr hilty's recommendations.  

## 2018-03-31 NOTE — Telephone Encounter (Signed)
Defer to PCP - I don't think this is cardiac swelling. May need low dose lasix PRN, but creatinine is mildly elevated - I don't know the details of that.  Dr. Lemmie Evens

## 2018-04-12 ENCOUNTER — Other Ambulatory Visit: Payer: Self-pay | Admitting: Family Medicine

## 2018-04-14 DIAGNOSIS — Z09 Encounter for follow-up examination after completed treatment for conditions other than malignant neoplasm: Secondary | ICD-10-CM | POA: Diagnosis not present

## 2018-04-14 LAB — HM MAMMOGRAPHY

## 2018-04-27 ENCOUNTER — Ambulatory Visit (INDEPENDENT_AMBULATORY_CARE_PROVIDER_SITE_OTHER): Payer: Medicare Other | Admitting: Internal Medicine

## 2018-04-27 ENCOUNTER — Encounter: Payer: Self-pay | Admitting: Internal Medicine

## 2018-04-27 VITALS — BP 138/64 | HR 59 | Ht 63.0 in | Wt 194.0 lb

## 2018-04-27 DIAGNOSIS — E1159 Type 2 diabetes mellitus with other circulatory complications: Secondary | ICD-10-CM | POA: Diagnosis not present

## 2018-04-27 DIAGNOSIS — Z951 Presence of aortocoronary bypass graft: Secondary | ICD-10-CM | POA: Diagnosis not present

## 2018-04-27 DIAGNOSIS — I152 Hypertension secondary to endocrine disorders: Secondary | ICD-10-CM

## 2018-04-27 DIAGNOSIS — R600 Localized edema: Secondary | ICD-10-CM

## 2018-04-27 DIAGNOSIS — G629 Polyneuropathy, unspecified: Secondary | ICD-10-CM | POA: Diagnosis not present

## 2018-04-27 DIAGNOSIS — E782 Mixed hyperlipidemia: Secondary | ICD-10-CM | POA: Diagnosis not present

## 2018-04-27 DIAGNOSIS — R5383 Other fatigue: Secondary | ICD-10-CM | POA: Diagnosis not present

## 2018-04-27 DIAGNOSIS — I10 Essential (primary) hypertension: Secondary | ICD-10-CM

## 2018-04-27 MED ORDER — ICOSAPENT ETHYL 1 G PO CAPS: 2.0000 | ORAL_CAPSULE | Freq: Two times a day (BID) | ORAL | 11 refills | Status: DC

## 2018-04-27 MED ORDER — FUROSEMIDE 20 MG PO TABS: 20.0000 mg | ORAL_TABLET | Freq: Every day | ORAL | 11 refills | Status: DC

## 2018-04-27 NOTE — Patient Instructions (Addendum)
  Medication Instructions:  Your physician has recommended you make the following change in your medication:  1) STOP Lovaza 2) START Vascepa 2g twice daily. An Rx has been sent to your pharmacy 3) STOP Triamterene HCTZ 4) START Lasix 20mg  daily. An Rx has been sent to your pharmacy If you need a refill on your cardiac medications before your next appointment, please call your pharmacy.   Lab work: Your physician recommends that you return for lab work in: 1 week ( Bmet, BNP, B12)  If you have labs (blood work) drawn today and your tests are completely normal, you will receive your results only by: Marland Kitchen MyChart Message (if you have MyChart) OR . A paper copy in the mail If you have any lab test that is abnormal or we need to change your treatment, we will call you to review the results.  Testing/Procedures: None ordered  Follow-Up: At [facility], you and your health needs are our priority.  As part of our continuing mission to provide you with exceptional heart care, we have created designated Provider Care Teams.  These Care Teams include your primary Cardiologist (physician) and Advanced Practice Providers (APPs -  Physician Assistants and Nurse Practitioners) who all work together to provide you with the care you need, when you need it. You will need a follow up appointment in 3-4 months.

## 2018-04-27 NOTE — Progress Notes (Signed)
OFFICE NOTE  Chief Complaint:  Leg swelling  Primary Care Physician: Claretta Fraise, MD  HPI:  Carmen [name] is a 78 year-old female comes in for followup of coronary disease. She had bypass surgery in 2007 and at that time had a 40% ejection fraction. She had a nuclear study in May of 2012 that was a low-risk study, but we could not calculate an ejection fraction because of ectopy. She has had no episodes of angina, no unusual shortness of breath unless she walks up a hill or a flight of steps, and then she becomes mildly breathless. She does not have PND or orthopnea. She does not have a productive cough. She has had no awareness of any arrhythmias, no dizziness, no syncope. She does have mild left ankle edema that is dependent in nature. She wears support hose infrequently for this. She has recurrent problems with gout, usually in her great toe. She has made some significant dietary changes, and the gout episodes seem to be less.  She has been diabetic for around 2 years. She has a hemoglobin A1c of 5.8, has had no significant episodes of hypoglycemia. She has noted some proximal lower extremity weakness. She gave an example working in her garden, when she tried to get up her legs were so weak that she had to get up on all fours, and one time her husband had to get her up. She has gone through physical therapy recently because of right hip bursitis, and it may very well be that she needs additional physical therapy for proximal muscle weakness and core strength building. No significant problems from the pollen. She has had some problems with constipation. Otherwise, no new complaints today.  Of note she's had a markedly abnormal lipid profile with particle numbers greater than 3000. She has been intolerant to all statins and has failed Crestor, Zetia, Zocor, Lipitor and Livalo in the past. I feel that she remains at very high risk of cardiac events and there is a great likelihood that  she may develop premature graft failure with her high cholesterol numbers. We did discuss the new PCSK9 inhibitors, for which she may be a great candidate if she is willing to give herself an injection every 2-4 weeks..  I saw Carmen [name] back in the office today. She is doing really well and denies any chest pain or worsening shortness of breath. She was referred for a trial of a study drug for PCS K9 inhibition. Unfortunately this was never arranged however subsequently to [facility] SK 9 inhibitors came out to the market. She was subsequently started on Praluent by her primary care provider and is been using that for several months. Hopefully this will get her cholesterol down to goal.  11/12/2015  Carmen [name] returns today for follow-up. Over the past year she's done fairly well. She occasionally gets some swelling in her legs. She denies any chest pain or shortness of breath. We had started her on Praluent at her last office visit due to statin intolerance. I again discussed the importance of using at least low-dose statin with the Praluent foremost benefit, however she has had a significant reduction in her cholesterol with LDL that was greater than 180 now down to 82. Although she has not had any coronary event since her bypass, it is now been 10 years since her bypass surgery. She last had a stress test in 2012 which was negative for ischemia.  10/29/2016  Carmen [name] was seen today in follow-up.  Unfortunately she lost her husband at the beginning of this year to an acute hemorrhagic stroke. This is her second husband but they've been married for 10 years and she said that he was the love of her life. Overall she is doing well from a coronary standpoint. She denies any chest pain or worsening shortness of breath. Show stress test in November of last year which was negative for ischemia. She's had marked improvement in her cholesterol on Praluent, but her triglycerides remain elevated. Most of this is  likely related to elevated blood sugars but that is also being worked on. She is currently on Januvia.  11/09/2017  Carmen [name] returns for follow-up.  Recently she saw [facility] care wife for preoperative risk assessment for GYN surgery.  She had a uterine fibroid which was removed uneventfully.  She has marked dyslipidemia on Praluent and has been intolerant to statins.  Recent lipid profile showed total cholesterol 188, triglycerides 358 (on Lovaza), HDL 42 and LDL 74.  Overall reasonable lipid profile except for elevated triglycerides which percent residual risk.  We discussed the possibility of switching her over to [facility] when it obtains FDA approval in October.  Blood pressure is well controlled today.  She denies any chest pain or worsening shortness of breath.  Unfortunately her blood sugars been very poorly controlled, probably contributing to her elevated triglycerides.  04/27/2018  Carmen [name] is seen today in follow-up.  She has successfully been using Praluent with some improvement in her lipid profile however most recently her total cholesterol is 187, triglycerides 466, HDL 37 and a direct LDL was not measured.  Presumably her LDL has come down further.  She continues to have persistently elevated triglycerides however which as previously mentioned may lead to persistently elevated cardiovascular risk.  Her only other complaint today is leg swelling.  She noted that she has had some persistently elevated edema despite being on triamterene HCTZ.  She is wondering if there is something stronger for her.  PMHx:  Past Medical History:  Diagnosis Date  . Arthritis   . Asymptomatic PVCs   . Bilateral shoulder pain   . CAD (coronary artery disease)   . Cancer ([place]) 11/2015   melanomax4  right upper arm  . Chronic renal insufficiency, stage 3 (moderate) (HCC)   . Diabetes mellitus without complication ([place])   . Diverticulosis   . Endometrial polyp   . External hemorrhoids   . Gout   .  Hemochromatosis, hereditary ([name]) 11/24/2012  . History of anemia   . History of duodenal ulcer 1990  . Hyperlipidemia   . Hypertension   . Internal hemorrhoids   . Jaundice    age 23 or 78  . Myocardial infarction ([place]) 2007  . Neuromuscular disorder ([place])    peripheral neuropathy  . PMB (postmenopausal bleeding)   . PONV (postoperative nausea and vomiting)   . Prolapse of female pelvic organs    uses pessary  . Seizures ([place])    had one at Dr. Antonieta Pert office after getting blood drawn  . Tick bite 08/12/2017   had 3 bites  . Vitamin D deficiency     Past Surgical History:  Procedure Laterality Date  . BREAST SURGERY     left breast lump--benign  . CARDIAC CATHETERIZATION  06/03/2005  . COLONOSCOPY  11/28/2001  . CORONARY ARTERY BYPASS GRAFT  2007   x2 Dr. Roxan Hockey, LIMA to LAD, SVG to PDA  . DILATATION & CURETTAGE/HYSTEROSCOPY WITH MYOSURE N/A 09/28/2017  Procedure: [place];  Surgeon: Molli Posey, MD;  Location: [facility];  Service: Gynecology;  Laterality: N/A;  . LIPOMA EXCISION     back  . UPPER GI ENDOSCOPY  01/20/1989    FAMHx:  Family History  Problem Relation Age of Onset  . Stroke Mother   . Hypertension Mother   . Neuropathy Mother   . Stroke Father   . Hypertension Father   . Diabetes Father   . Cancer Sister 27       sarcoma  . Diabetes Brother   . Arthritis Sister   . Arthritis Sister   . Lupus Sister   . Hemachromatosis Sister   . Arthritis Sister   . Hemachromatosis Sister   . Hemachromatosis Sister   . Diverticulitis Sister   . Diabetes Brother   . Diabetes Brother   . Hemachromatosis Brother     SOCHx:   reports that she has never smoked. She has never used smokeless tobacco. She reports current alcohol use. She reports that she does not use drugs.  ALLERGIES:  Allergies  Allergen Reactions  . Cymbalta [Duloxetine Hcl] Other (See Comments)    Profuse sweating  .  Meloxicam Nausea And Vomiting  . Diclofenac Diarrhea  . Shellfish Allergy Other (See Comments)    Gout  . Tape     Causes skin irritation  . Crestor [Rosuvastatin] Other (See Comments)    Muscle aches on 5 mg daily and 10 mg twice weekly  . Lipitor [Atorvastatin] Other (See Comments)    Muscle aches  . Livalo [Pitavastatin] Other (See Comments)       . Sulfa Antibiotics Rash  . Zetia [Ezetimibe] Other (See Comments)    Muscle aches  . Zocor [Simvastatin] Other (See Comments)    Muscle aches     ROS: Pertinent items noted in HPI and remainder of comprehensive ROS otherwise negative.  HOME MEDS: Current Outpatient Medications  Medication Sig Dispense Refill  . Alirocumab (PRALUENT) 75 MG/ML SOAJ Inject 75 mg into the skin every 14 (fourteen) days. 6 mL 0  . allopurinol (ZYLOPRIM) 100 MG tablet TAKE (1) TABLET TWICE A DAY. 180 tablet 0  . aspirin 81 MG tablet Take 81 mg by mouth daily.    . colchicine 0.6 MG tablet TAKE (1) TABLET DAILY AS NEEDED. 30 tablet 1  . fluticasone (FLONASE) 50 MCG/ACT nasal spray SPRAY 1 SPRAY IN EACH NOSTRIL ONCE DAILY. (Patient taking differently: Place 2 sprays into both nostrils as needed. ) 16 g 4  . glucose blood (ONE TOUCH ULTRA TEST) test strip CHECK BLOOD SUGAR 2 TIMES A DAY 100 each prn  . Insulin Glargine (BASAGLAR KWIKPEN) 100 UNIT/ML SOPN Inject 0.05 mLs (5 Units total) into the skin at bedtime. Until Blood glucose is less than 200 (Patient taking differently: Inject 5 Units into the skin as needed. Until Blood glucose is less than 200) 1 pen 0  . JANUVIA 100 MG tablet TAKE 1 TABLET DAILY 90 tablet 0  . Lancets Misc. (ACCU-CHEK FASTCLIX LANCET) KIT Use to check Blood Sugars 1 kit 0  . metoprolol tartrate (LOPRESSOR) 25 MG tablet TAKE (1) TABLET TWICE A DAY. 180 tablet 0  . olmesartan (BENICAR) 20 MG tablet TAKE 1 TABLET DAILY 90 tablet 0  . omega-3 acid ethyl esters (LOVAZA) 1 g capsule TAKE (2) CAPSULES TWICE DAILY. 360 capsule 1  .  pregabalin (LYRICA) 50 MG capsule Take 50 mg by mouth 2 (two) times daily.    Marland Kitchen  triamterene-hydrochlorothiazide (DYAZIDE) 37.5-25 MG capsule TAKE (1) CAPSULE DAILY 90 capsule 0  . Vitamin D, Ergocalciferol, (DRISDOL) 50000 units CAPS capsule Take 1 capsule (50,000 Units total) by mouth 2 (two) times a week. 26 capsule 3  . VOLTAREN 1 % GEL APPLY 4 GRAMS TO AFFECTED AREA(S) 4 TIMES A DAY AS NEEDED 100 g 2   No current facility-administered medications for this visit.     LABS/IMAGING: No results found for this or any previous visit (from the past 48 hour(s)). No results found.  VITALS: BP 138/64   Pulse (!) 59   Ht 5' 3"  (1.6 m)   Wt 194 lb (88 kg)   BMI 34.37 kg/m   EXAM: General appearance: alert, no distress and mildly obese Neck: no carotid bruit and no JVD Lungs: clear to auscultation bilaterally Heart: regular rate and rhythm, S1, S2 normal, no murmur, click, rub or gallop Abdomen: soft, non-tender; bowel sounds normal; no masses,  no organomegaly Extremities: extremities normal, atraumatic, no cyanosis or edema and venous stasis dermatitis noted Pulses: 2+ and symmetric Skin: Skin color, texture, turgor normal. No rashes or lesions Neurologic: Grossly normal Psych: Pleasant  EKG: Sinus bradycardia 59, inferior infarct pattern and lateral T wave changes-personally reviewed  ASSESSMENT: 1. Coronary artery bypass grafting x2 vessels with LIMA to LAD and SVG to PDA in 2007 - negative myoview in 01/2016 2. Asymptomatic PVCs 3. Hypertension-controlled 4. Dyslipidemia - intolerant to all statins and zetia, markedly improved on Praluent 5. Diabetes 6. Gout   PLAN: 1.   Carmen [name] has persistently elevated lipid profile despite PCSK9 inhibitor therapy.  Unfortunately she was intolerant to all statins and Zetia.  Her triglycerides remain elevated and per data from the REDUCE-IT trial, she is a good candidate for Vascepa.  This has FDA approval.  I would recommend starting 2g  p.o. twice daily.  We will stop Lovaza as it is of uncertain benefit and potentially associated with trend toward harm in some studies.  Finally, she reported some increase in lower extremity edema.  I will stop her triamterene HCTZ and add Lasix 20 mg daily.  Plan a metabolic profile in 1 week to evaluate potassium and renal function on Lasix.  Will need a repeat lipid profile with a direct LDL in 3 to 4 months and follow-up with me afterwards.  Carmen Casino, MD, [facility], [place] Director of the Advanced Lipid Disorders &  Cardiovascular Risk Reduction Clinic Diplomate of the American Board of Clinical Lipidology Attending Cardiologist  Direct Dial: [phone]  Fax: [phone]  Website:  [email or web address]   Carmen [name] 04/27/2018, 2:22 PM

## 2018-04-30 ENCOUNTER — Encounter: Payer: Self-pay | Admitting: Internal Medicine

## 2018-05-10 ENCOUNTER — Telehealth: Payer: Self-pay | Admitting: Internal Medicine

## 2018-05-10 DIAGNOSIS — E119 Type 2 diabetes mellitus without complications: Secondary | ICD-10-CM

## 2018-05-10 NOTE — Telephone Encounter (Signed)
New message    Patient would like A1C to be added to her lab order. Please advise.

## 2018-05-10 NOTE — Telephone Encounter (Signed)
Left message to call back  

## 2018-05-11 DIAGNOSIS — E119 Type 2 diabetes mellitus without complications: Secondary | ICD-10-CM | POA: Diagnosis not present

## 2018-05-11 DIAGNOSIS — I10 Essential (primary) hypertension: Secondary | ICD-10-CM | POA: Diagnosis not present

## 2018-05-11 DIAGNOSIS — Z951 Presence of aortocoronary bypass graft: Secondary | ICD-10-CM | POA: Diagnosis not present

## 2018-05-11 DIAGNOSIS — E1159 Type 2 diabetes mellitus with other circulatory complications: Secondary | ICD-10-CM | POA: Diagnosis not present

## 2018-05-11 DIAGNOSIS — R5383 Other fatigue: Secondary | ICD-10-CM | POA: Diagnosis not present

## 2018-05-11 NOTE — Telephone Encounter (Signed)
Patient called back.  She can be reached at home in the next 45 mins, if not on her cell phone [phone].  She would like the A1C add to her lab orders.

## 2018-05-11 NOTE — Telephone Encounter (Signed)
Added A1C lab orders to her current labs she will have drawn today.  Patient verbalized understanding and was thankful for the call.

## 2018-05-12 LAB — BASIC METABOLIC PANEL
BUN/Creatinine Ratio: 16 (ref 12–28)
BUN: 18 mg/dL (ref 8–27)
CHLORIDE: 100 mmol/L (ref 96–106)
CO2: 24 mmol/L (ref 20–29)
Calcium: 9.9 mg/dL (ref 8.7–10.3)
Creatinine, Ser: 1.16 mg/dL — ABNORMAL HIGH (ref 0.57–1.00)
GFR calc Af Amer: 52 mL/min/{1.73_m2} — ABNORMAL LOW (ref >59)
GFR calc non Af Amer: 46 mL/min/{1.73_m2} — ABNORMAL LOW (ref >59)
Glucose: 244 mg/dL — ABNORMAL HIGH (ref 65–99)
Potassium: 3.9 mmol/L (ref 3.5–5.2)
Sodium: 142 mmol/L (ref 134–144)

## 2018-05-12 LAB — B12 AND FOLATE PANEL
Folate: 15.9 ng/mL (ref >3.0)
Vitamin B-12: 572 pg/mL (ref 232–1245)

## 2018-05-12 LAB — HEMOGLOBIN A1C
ESTIMATED AVERAGE GLUCOSE: 200 mg/dL
Hgb A1c MFr Bld: 8.6 % — ABNORMAL HIGH (ref 4.8–5.6)

## 2018-05-12 LAB — PRO B NATRIURETIC PEPTIDE: NT-Pro BNP: 154 pg/mL (ref 0–738)

## 2018-05-13 ENCOUNTER — Encounter: Payer: Self-pay | Admitting: Family Medicine

## 2018-05-13 ENCOUNTER — Ambulatory Visit (INDEPENDENT_AMBULATORY_CARE_PROVIDER_SITE_OTHER): Payer: Medicare Other | Admitting: Family Medicine

## 2018-05-13 VITALS — BP 127/69 | HR 65 | Temp 97.3°F | Ht 63.0 in | Wt 192.4 lb

## 2018-05-13 DIAGNOSIS — IMO0001 Reserved for inherently not codable concepts without codable children: Secondary | ICD-10-CM

## 2018-05-13 DIAGNOSIS — Z794 Long term (current) use of insulin: Secondary | ICD-10-CM

## 2018-05-13 DIAGNOSIS — E119 Type 2 diabetes mellitus without complications: Secondary | ICD-10-CM

## 2018-05-17 ENCOUNTER — Encounter: Payer: Self-pay | Admitting: Family Medicine

## 2018-05-17 NOTE — Progress Notes (Signed)
Subjective:  Patient ID: Carmen [name], female    DOB: Oct 23, 1940  Age: 78 y.o. MRN: [id number]  CC: Diabetes   HPI Carmen [name] presents forFollow-up of diabetes. Patient checks blood sugar at home.  Noted to be too high. Would like to try trulicity.  Patient denies symptoms such as polyuria, polydipsia, excessive hunger, nausea No significant hypoglycemic spells noted. Medications reviewed. Pt reports taking them regularly without complication/adverse reaction being reported today.    History Carmen [name] has a past medical history of Arthritis, Asymptomatic PVCs, Bilateral shoulder pain, CAD (coronary artery disease), Cancer ([place]) (11/2015), Chronic renal insufficiency, stage 3 (moderate) ([place]), Diabetes mellitus without complication ([place]), Diverticulosis, Endometrial polyp, External hemorrhoids, Gout, Hemochromatosis, hereditary ([place]) (11/24/2012), History of anemia, History of duodenal ulcer (1990), Hyperlipidemia, Hypertension, Internal hemorrhoids, Jaundice, Myocardial infarction ([place]) (2007), Neuromuscular disorder ([place]), PMB (postmenopausal bleeding), PONV (postoperative nausea and vomiting), Prolapse of female pelvic organs, Seizures ([place]), Tick bite (08/12/2017), and Vitamin D deficiency.   She has a past surgical history that includes Lipoma excision; Breast surgery; Cardiac catheterization (06/03/2005); Colonoscopy (11/28/2001); Upper gi endoscopy (01/20/1989); Coronary artery bypass graft (2007); and Dilatation & curettage/hysteroscopy with myosure (N/A, 09/28/2017).   Her family history includes Arthritis in her sister, sister, and sister; Cancer (age of onset: 6) in her sister; Diabetes in her brother, brother, brother, and father; Diverticulitis in her sister; Hemachromatosis in her brother, sister, sister, and sister; Hypertension in her father and mother; Lupus in her sister; Neuropathy in her mother; Stroke in her father and mother.She reports that she has never smoked. She  has never used smokeless tobacco. She reports current alcohol use. She reports that she does not use drugs.  Current Outpatient Medications on File Prior to Visit  Medication Sig Dispense Refill  . Alirocumab (PRALUENT) 75 MG/ML SOAJ Inject 75 mg into the skin every 14 (fourteen) days. 6 mL 0  . allopurinol (ZYLOPRIM) 100 MG tablet TAKE (1) TABLET TWICE A DAY. 180 tablet 0  . aspirin 81 MG tablet Take 81 mg by mouth daily.    . colchicine 0.6 MG tablet TAKE (1) TABLET DAILY AS NEEDED. 30 tablet 1  . fluticasone (FLONASE) 50 MCG/ACT nasal spray SPRAY 1 SPRAY IN EACH NOSTRIL ONCE DAILY. (Patient taking differently: Place 2 sprays into both nostrils as needed. ) 16 g 4  . furosemide (LASIX) 20 MG tablet Take 1 tablet (20 mg total) by mouth daily. 30 tablet 11  . glucose blood (ONE TOUCH ULTRA TEST) test strip CHECK BLOOD SUGAR 2 TIMES A DAY 100 each prn  . Icosapent Ethyl (VASCEPA) 1 g CAPS Take 2 capsules (2 g total) by mouth 2 (two) times daily. 120 capsule 11  . Lancets Misc. (ACCU-CHEK FASTCLIX LANCET) KIT Use to check Blood Sugars 1 kit 0  . metoprolol tartrate (LOPRESSOR) 25 MG tablet TAKE (1) TABLET TWICE A DAY. 180 tablet 0  . olmesartan (BENICAR) 20 MG tablet TAKE 1 TABLET DAILY 90 tablet 0  . pregabalin (LYRICA) 50 MG capsule Take 50 mg by mouth 2 (two) times daily.    . Vitamin D, Ergocalciferol, (DRISDOL) 50000 units CAPS capsule Take 1 capsule (50,000 Units total) by mouth 2 (two) times a week. 26 capsule 3  . VOLTAREN 1 % GEL APPLY 4 GRAMS TO AFFECTED AREA(S) 4 TIMES A DAY AS NEEDED 100 g 2  . Insulin Glargine (BASAGLAR KWIKPEN) 100 UNIT/ML SOPN Inject 0.05 mLs (5 Units total) into the skin at bedtime. Until Blood glucose is less than  200 (Patient not taking: Reported on 05/13/2018) 1 pen 0   No current facility-administered medications on file prior to visit.     ROS Review of Systems  Constitutional: Negative.   HENT: Negative for congestion.   Eyes: Negative for visual  disturbance.  Respiratory: Negative for shortness of breath.   Cardiovascular: Negative for chest pain.  Gastrointestinal: Negative for abdominal pain, constipation, diarrhea, nausea and vomiting.  Genitourinary: Negative for difficulty urinating.  Musculoskeletal: Negative for arthralgias and myalgias.  Neurological: Negative for headaches.  Psychiatric/Behavioral: Negative for sleep disturbance.    Objective:  BP 127/69   Pulse 65   Temp (!) 97.3 F (36.3 C) (Oral)   Ht _0  (1.6 m)   Wt 192 lb 6 oz (87.3 kg)   BMI 34.08 kg/m   BP Readings from Last 3 Encounters:  05/13/18 127/69  04/27/18 138/64  12/28/17 139/71    Wt Readings from Last 3 Encounters:  05/13/18 192 lb 6 oz (87.3 kg)  04/27/18 194 lb (88 kg)  12/28/17 194 lb (88 kg)     Physical Exam Constitutional:      General: She is not in acute distress.    Appearance: She is well-developed.  HENT:     Head: Normocephalic and atraumatic.  Eyes:     Conjunctiva/sclera: Conjunctivae normal.     Pupils: Pupils are equal, round, and reactive to light.  Neck:     Musculoskeletal: Normal range of motion and neck supple.     Thyroid: No thyromegaly.  Cardiovascular:     Rate and Rhythm: Normal rate and regular rhythm.     Heart sounds: Normal heart sounds. No murmur.  Pulmonary:     Effort: Pulmonary effort is normal. No respiratory distress.     Breath sounds: Normal breath sounds. No wheezing or rales.  Abdominal:     General: Bowel sounds are normal. There is no distension.     Palpations: Abdomen is soft.     Tenderness: There is no abdominal tenderness.  Musculoskeletal: Normal range of motion.  Lymphadenopathy:     Cervical: No cervical adenopathy.  Skin:    General: Skin is warm and dry.  Neurological:     Mental Status: She is alert and oriented to person, place, and time.  Psychiatric:        Behavior: Behavior normal.        Thought Content: Thought content normal.        Judgment: Judgment  normal.       Assessment & Plan:   Carmen [name] was seen today for diabetes.  Diagnoses and all orders for this visit:  Insulin dependent diabetes mellitus ([place])      I have discontinued Carmen [name]'s JANUVIA. I am also having her maintain her aspirin, fluticasone, VOLTAREN, ACCU-CHEK FASTCLIX LANCET, colchicine, glucose blood, Vitamin D (Ergocalciferol), BASAGLAR KWIKPEN, pregabalin, Alirocumab, allopurinol, metoprolol tartrate, olmesartan, Icosapent Ethyl, and furosemide.  No orders of the defined types were placed in this encounter.    Follow-up: Return in about 2 weeks (around 05/27/2018).  Claretta Fraise, M.D.

## 2018-05-18 ENCOUNTER — Telehealth: Payer: Self-pay | Admitting: Family Medicine

## 2018-05-18 NOTE — Telephone Encounter (Signed)
No samples available at this time, patient aware

## 2018-05-23 MED ORDER — DULAGLUTIDE 0.75 MG/0.5ML ~~LOC~~ SOAJ: 0.7500 mg | SUBCUTANEOUS | 1 refills | Status: DC

## 2018-05-23 NOTE — Telephone Encounter (Signed)
PT is wanting to know if we have samples of truclicity, pt had to cancel her apt for Friday that she had with Dr Livia Snellen since we haven't had any samples

## 2018-05-23 NOTE — Telephone Encounter (Signed)
Advised pt we don't have any samples but pt requested rx sent to pharmacy to see if insurance would cover it. Rx sent to pharmacy as requested and pt will pick it up if not too expensive and reschedule appt after she starts taking Trulicity.

## 2018-05-27 ENCOUNTER — Ambulatory Visit: Payer: Medicare Other | Admitting: Family Medicine

## 2018-05-31 ENCOUNTER — Other Ambulatory Visit: Payer: Self-pay | Admitting: Family Medicine

## 2018-06-07 ENCOUNTER — Encounter: Payer: Self-pay | Admitting: Family Medicine

## 2018-06-08 ENCOUNTER — Other Ambulatory Visit: Payer: Self-pay | Admitting: Family Medicine

## 2018-06-08 MED ORDER — DULAGLUTIDE 1.5 MG/0.5ML ~~LOC~~ SOAJ: SUBCUTANEOUS | 12 refills | Status: DC

## 2018-06-13 ENCOUNTER — Other Ambulatory Visit: Payer: Self-pay | Admitting: Family Medicine

## 2018-07-06 ENCOUNTER — Other Ambulatory Visit: Payer: Self-pay | Admitting: Family Medicine

## 2018-07-19 ENCOUNTER — Telehealth: Payer: Self-pay | Admitting: *Deleted

## 2018-07-19 NOTE — Telephone Encounter (Signed)
07/19/18 LMOM @ 3:29, re: follow up appointment.

## 2018-07-21 ENCOUNTER — Telehealth: Payer: Self-pay | Admitting: Family Medicine

## 2018-07-22 ENCOUNTER — Telehealth: Payer: Self-pay | Admitting: Internal Medicine

## 2018-07-22 NOTE — Telephone Encounter (Signed)
New message   Patient is returning call about setting up a virtual visit. Please call.

## 2018-07-22 NOTE — Telephone Encounter (Signed)
Per chart review, Vashti Hey called patient on 4/28 to schedule appointment. Routed message to Dr. Lysbeth Penner scheduler

## 2018-07-25 ENCOUNTER — Other Ambulatory Visit: Payer: Self-pay | Admitting: Family Medicine

## 2018-07-26 ENCOUNTER — Telehealth: Payer: Self-pay | Admitting: Family Medicine

## 2018-07-26 ENCOUNTER — Other Ambulatory Visit: Payer: Self-pay | Admitting: Family Medicine

## 2018-07-26 MED ORDER — SITAGLIPTIN PHOSPHATE 100 MG PO TABS: 100.0000 mg | ORAL_TABLET | Freq: Every day | ORAL | 1 refills | Status: DC

## 2018-07-26 NOTE — Telephone Encounter (Signed)
Have her DC the trulicity. I sent in Januvia instead. Have her resume that. When I next see her we can decide if it is sufficient or if I need to add something to it for her.

## 2018-07-26 NOTE — Telephone Encounter (Signed)
Patient's last office visit 05/13/2018, medication was listed in office visit note but is not listed on her medication list.  Unsure if patient is still on medication.

## 2018-07-26 NOTE — Telephone Encounter (Signed)
Patient aware and verbalized understanding. °

## 2018-08-01 ENCOUNTER — Other Ambulatory Visit: Payer: Self-pay | Admitting: *Deleted

## 2018-08-01 MED ORDER — PREGABALIN 50 MG PO CAPS: 50.0000 mg | ORAL_CAPSULE | Freq: Two times a day (BID) | ORAL | 2 refills | Status: DC

## 2018-08-01 NOTE — Telephone Encounter (Signed)
TC from [facility] [facility] on [place]

## 2018-08-04 ENCOUNTER — Encounter: Payer: Self-pay | Admitting: Family Medicine

## 2018-08-05 ENCOUNTER — Other Ambulatory Visit: Payer: Self-pay

## 2018-08-08 ENCOUNTER — Encounter: Payer: Self-pay | Admitting: Family Medicine

## 2018-08-08 ENCOUNTER — Ambulatory Visit (INDEPENDENT_AMBULATORY_CARE_PROVIDER_SITE_OTHER): Payer: Medicare Other | Admitting: Family Medicine

## 2018-08-08 ENCOUNTER — Other Ambulatory Visit: Payer: Self-pay

## 2018-08-08 VITALS — BP 145/71 | HR 65 | Temp 98.5°F | Ht 63.0 in | Wt 189.0 lb

## 2018-08-08 DIAGNOSIS — Z951 Presence of aortocoronary bypass graft: Secondary | ICD-10-CM

## 2018-08-08 DIAGNOSIS — E1159 Type 2 diabetes mellitus with other circulatory complications: Secondary | ICD-10-CM

## 2018-08-08 DIAGNOSIS — E782 Mixed hyperlipidemia: Secondary | ICD-10-CM | POA: Diagnosis not present

## 2018-08-08 DIAGNOSIS — E669 Obesity, unspecified: Secondary | ICD-10-CM

## 2018-08-08 DIAGNOSIS — E1169 Type 2 diabetes mellitus with other specified complication: Secondary | ICD-10-CM

## 2018-08-08 DIAGNOSIS — I10 Essential (primary) hypertension: Secondary | ICD-10-CM

## 2018-08-08 MED ORDER — FLUTICASONE PROPIONATE 50 MCG/ACT NA SUSP: NASAL | 4 refills | Status: DC

## 2018-08-08 MED ORDER — COLCHICINE 0.6 MG PO TABS: ORAL_TABLET | ORAL | 1 refills | Status: AC

## 2018-08-08 MED ORDER — OLMESARTAN MEDOXOMIL 20 MG PO TABS: 20.0000 mg | ORAL_TABLET | Freq: Every day | ORAL | 1 refills | Status: DC

## 2018-08-08 MED ORDER — ICOSAPENT ETHYL 1 G PO CAPS: 2.0000 | ORAL_CAPSULE | Freq: Two times a day (BID) | ORAL | 11 refills | Status: DC

## 2018-08-08 MED ORDER — METOPROLOL TARTRATE 25 MG PO TABS: 25.0000 mg | ORAL_TABLET | Freq: Two times a day (BID) | ORAL | 1 refills | Status: DC

## 2018-08-08 MED ORDER — DAPAGLIFLOZIN PROPANEDIOL 5 MG PO TABS: 5.0000 mg | ORAL_TABLET | Freq: Every day | ORAL | 2 refills | Status: DC

## 2018-08-08 MED ORDER — VITAMIN D (ERGOCALCIFEROL) 1.25 MG (50000 UNIT) PO CAPS: 50000.0000 [IU] | ORAL_CAPSULE | ORAL | 3 refills | Status: DC

## 2018-08-08 MED ORDER — ALLOPURINOL 100 MG PO TABS: 100.0000 mg | ORAL_TABLET | Freq: Two times a day (BID) | ORAL | 1 refills | Status: DC

## 2018-08-08 MED ORDER — FUROSEMIDE 20 MG PO TABS: 20.0000 mg | ORAL_TABLET | Freq: Every day | ORAL | 11 refills | Status: DC

## 2018-08-08 MED ORDER — DOXYCYCLINE HYCLATE 100 MG PO CAPS: ORAL_CAPSULE | ORAL | 0 refills | Status: DC

## 2018-08-08 NOTE — Progress Notes (Signed)
Subjective:  Patient ID: Carmen [name], female    DOB: 11-12-1940  Age: 78 y.o. MRN: [id number]  CC: Diabetes (discuss meds ) and Edema   HPI Carmen [name] presents forFollow-up of diabetes. Patient checks blood sugar at home.  200-250 fasting and even higher postprandial.  This is since switching back from the Trulicity to Januvia.  She is frustrated that the Januvia causes her to swell and the Trulicity caused nausea the nausea was rather severe 2 days out of the week.  It did seem to help her blood sugar better though. Patient denies symptoms such as polyuria, polydipsia, excessive hunger, nausea No significant hypoglycemic spells noted. Medications reviewed. Pt reports taking them regularly without complication/adverse reaction being reported today.  Additionally the patient was walking in her backyard quite a bit yesterday and today pulled a tick off of her lower right flank region at the inguinal crease.  It was not engorged.  It was the size of a deer tick.  History Carmen [name] has a past medical history of Arthritis, Asymptomatic PVCs, Bilateral shoulder pain, CAD (coronary artery disease), Cancer ([place]) (11/2015), Chronic renal insufficiency, stage 3 (moderate) ([place]), Diabetes mellitus without complication ([place]), Diverticulosis, Endometrial polyp, External hemorrhoids, Gout, Hemochromatosis, hereditary ([place]) (11/24/2012), History of anemia, History of duodenal ulcer (1990), Hyperlipidemia, Hypertension, Internal hemorrhoids, Jaundice, Myocardial infarction ([place]) (2007), Neuromuscular disorder ([place]), PMB (postmenopausal bleeding), PONV (postoperative nausea and vomiting), Prolapse of female pelvic organs, Seizures ([place]), Tick bite (08/12/2017), and Vitamin D deficiency.   She has a past surgical history that includes Lipoma excision; Breast surgery; Cardiac catheterization (06/03/2005); Colonoscopy (11/28/2001); Upper gi endoscopy (01/20/1989); Coronary artery bypass graft (2007); and  Dilatation & curettage/hysteroscopy with myosure (N/A, 09/28/2017).   Her family history includes Arthritis in her sister, sister, and sister; Cancer (age of onset: 75) in her sister; Diabetes in her brother, brother, brother, and father; Diverticulitis in her sister; Hemachromatosis in her brother, sister, sister, and sister; Hypertension in her father and mother; Lupus in her sister; Neuropathy in her mother; Stroke in her father and mother.She reports that she has never smoked. She has never used smokeless tobacco. She reports current alcohol use. She reports that she does not use drugs.  Current Outpatient Medications on File Prior to Visit  Medication Sig Dispense Refill   aspirin 81 MG tablet Take 81 mg by mouth daily.     glucose blood (ONE TOUCH ULTRA TEST) test strip CHECK BLOOD SUGAR 2 TIMES A DAY 100 each prn   Lancets Misc. (ACCU-CHEK FASTCLIX LANCET) KIT Use to check Blood Sugars 1 kit 0   PRALUENT 75 MG/ML SOAJ INJECT 75 MG UNDER THE SKIN EVERY 14 DAYS 2 mL 5   pregabalin ([place]ICA) 50 MG capsule Take 1 capsule (50 mg total) by mouth 2 (two) times daily. 60 capsule 2   VOLTAREN 1 % GEL APPLY 4 GRAMS TO AFFECTED AREA(S) 4 TIMES A DAY AS NEEDED 100 g 2   No current facility-administered medications on file prior to visit.     ROS Review of Systems  Constitutional: Negative.   HENT: Negative for congestion.   Eyes: Negative for visual disturbance.  Respiratory: Negative for shortness of breath.   Cardiovascular: Negative for chest pain.  Gastrointestinal: Negative for abdominal pain, constipation, diarrhea, nausea and vomiting.  Genitourinary: Negative for difficulty urinating.  Musculoskeletal: Negative for arthralgias and myalgias.  Neurological: Negative for headaches.  Psychiatric/Behavioral: Negative for sleep disturbance.    Objective:  BP (!) 145/71 (BP Location: Right  Arm)    Pulse 65    Temp 98.5 F (36.9 C) (Oral)    Ht 5' 3"  (1.6 m)    Wt 189 lb (85.7 kg)     BMI 33.48 kg/m   BP Readings from Last 3 Encounters:  08/08/18 (!) 145/71  05/13/18 127/69  04/27/18 138/64    Wt Readings from Last 3 Encounters:  08/08/18 189 lb (85.7 kg)  05/13/18 192 lb 6 oz (87.3 kg)  04/27/18 194 lb (88 kg)     Physical Exam Constitutional:      General: She is not in acute distress.    Appearance: She is well-developed.  HENT:     Head: Normocephalic and atraumatic.     Right Ear: External ear normal.     Left Ear: External ear normal.     Nose: Nose normal.  Eyes:     Conjunctiva/sclera: Conjunctivae normal.     Pupils: Pupils are equal, round, and reactive to light.  Neck:     Musculoskeletal: Normal range of motion and neck supple.     Thyroid: No thyromegaly.  Cardiovascular:     Rate and Rhythm: Normal rate and regular rhythm.     Heart sounds: Normal heart sounds. No murmur.  Pulmonary:     Effort: Pulmonary effort is normal. No respiratory distress.     Breath sounds: Normal breath sounds. No wheezing or rales.  Abdominal:     General: Bowel sounds are normal. There is no distension.     Palpations: Abdomen is soft.     Tenderness: There is no abdominal tenderness.  Lymphadenopathy:     Cervical: No cervical adenopathy.  Skin:    General: Skin is warm and dry.     Findings: Erythema (  Pinpoint region noted at the right thigh just below the inguinal crease Anteriorly) present.  Neurological:     Mental Status: She is alert and oriented to person, place, and time.     Deep Tendon Reflexes: Reflexes are normal and symmetric.  Psychiatric:        Behavior: Behavior normal.        Thought Content: Thought content normal.        Judgment: Judgment normal.       Assessment & Plan:   Carmen [name] was seen today for diabetes and edema.  Diagnoses and all orders for this visit:  Diabetes mellitus type 2 in obese ([place]) -     CBC with Differential/Platelet; Future -     Bayer DCA Hb A1c Waived; Future  Hypertension associated with  diabetes ([place]) -     CBC with Differential/Platelet; Future -     CMP14+EGFR; Future -     furosemide (LASIX) 20 MG tablet; Take 1 tablet (20 mg total) by mouth daily.  Mixed hyperlipidemia -     CBC with Differential/Platelet; Future -     Lipid panel; Future  S/P CABG x 2 -     furosemide (LASIX) 20 MG tablet; Take 1 tablet (20 mg total) by mouth daily.  Obesity (BMI 30.0-34.9)  Other orders -     Vitamin D, Ergocalciferol, (DRISDOL) 1.25 MG (50000 UT) CAPS capsule; Take 1 capsule (50,000 Units total) by mouth 2 (two) times a week. -     olmesartan (BENICAR) 20 MG tablet; Take 1 tablet (20 mg total) by mouth daily. -     metoprolol tartrate (LOPRESSOR) 25 MG tablet; Take 1 tablet (25 mg total) by mouth 2 (two) times daily. -  Icosapent Ethyl (VASCEPA) 1 g CAPS; Take 2 capsules (2 g total) by mouth 2 (two) times daily. -     fluticasone (FLONASE) 50 MCG/ACT nasal spray; SPRAY 1 SPRAY IN EACH NOSTRIL ONCE DAILY. -     colchicine 0.6 MG tablet; TAKE (1) TABLET DAILY AS NEEDED. -     allopurinol (ZYLOPRIM) 100 MG tablet; Take 1 tablet (100 mg total) by mouth 2 (two) times daily. -     dapagliflozin propanediol (FARXIGA) 5 MG TABS tablet; Take 5 mg by mouth daily. -     doxycycline (VIBRAMYCIN) 100 MG capsule; Take 2 tablets at one time to prevent tick bite infection.      I have discontinued Carmen [name] sitaGLIPtin. I have also changed her Vitamin D (Ergocalciferol), olmesartan, metoprolol tartrate, and allopurinol. Additionally, I am having her start on dapagliflozin propanediol and doxycycline. Lastly, I am having her maintain her aspirin, Voltaren, Accu-Chek FastClix Lancet, glucose blood, Praluent, pregabalin, furosemide, Icosapent Ethyl, fluticasone, and colchicine.  Meds ordered this encounter  Medications   Vitamin D, Ergocalciferol, (DRISDOL) 1.25 MG (50000 UT) CAPS capsule    Sig: Take 1 capsule (50,000 Units total) by mouth 2 (two) times a week.    Dispense:   26 capsule    Refill:  3   furosemide (LASIX) 20 MG tablet    Sig: Take 1 tablet (20 mg total) by mouth daily.    Dispense:  30 tablet    Refill:  11   olmesartan (BENICAR) 20 MG tablet    Sig: Take 1 tablet (20 mg total) by mouth daily.    Dispense:  90 tablet    Refill:  1   metoprolol tartrate (LOPRESSOR) 25 MG tablet    Sig: Take 1 tablet (25 mg total) by mouth 2 (two) times daily.    Dispense:  180 tablet    Refill:  1   Icosapent Ethyl (VASCEPA) 1 g CAPS    Sig: Take 2 capsules (2 g total) by mouth 2 (two) times daily.    Dispense:  120 capsule    Refill:  11   fluticasone (FLONASE) 50 MCG/ACT nasal spray    Sig: SPRAY 1 SPRAY IN EACH NOSTRIL ONCE DAILY.    Dispense:  16 g    Refill:  4   colchicine 0.6 MG tablet    Sig: TAKE (1) TABLET DAILY AS NEEDED.    Dispense:  30 tablet    Refill:  1   allopurinol (ZYLOPRIM) 100 MG tablet    Sig: Take 1 tablet (100 mg total) by mouth 2 (two) times daily.    Dispense:  180 tablet    Refill:  1   dapagliflozin propanediol (FARXIGA) 5 MG TABS tablet    Sig: Take 5 mg by mouth daily.    Dispense:  30 tablet    Refill:  2   doxycycline (VIBRAMYCIN) 100 MG capsule    Sig: Take 2 tablets at one time to prevent tick bite infection.    Dispense:  2 capsule    Refill:  0     Follow-up: Return in about 3 months (around 11/08/2018).  Claretta Fraise, M.D.

## 2018-08-10 ENCOUNTER — Telehealth: Payer: Self-pay | Admitting: *Deleted

## 2018-08-10 ENCOUNTER — Other Ambulatory Visit: Payer: Self-pay

## 2018-08-10 ENCOUNTER — Other Ambulatory Visit: Payer: Medicare Other

## 2018-08-10 DIAGNOSIS — E1169 Type 2 diabetes mellitus with other specified complication: Secondary | ICD-10-CM | POA: Diagnosis not present

## 2018-08-10 DIAGNOSIS — E1159 Type 2 diabetes mellitus with other circulatory complications: Secondary | ICD-10-CM

## 2018-08-10 DIAGNOSIS — I152 Hypertension secondary to endocrine disorders: Secondary | ICD-10-CM

## 2018-08-10 DIAGNOSIS — E669 Obesity, unspecified: Secondary | ICD-10-CM

## 2018-08-10 DIAGNOSIS — E782 Mixed hyperlipidemia: Secondary | ICD-10-CM

## 2018-08-10 DIAGNOSIS — I10 Essential (primary) hypertension: Secondary | ICD-10-CM | POA: Diagnosis not present

## 2018-08-10 LAB — BAYER DCA HB A1C WAIVED: HB A1C (BAYER DCA - WAIVED): 8.6 % — ABNORMAL HIGH (ref <7.0)

## 2018-08-10 NOTE — Telephone Encounter (Signed)
Pt notified of results Verbalizes understanding 

## 2018-08-10 NOTE — Telephone Encounter (Signed)
-----   Message from Claretta Fraise, MD sent at 08/10/2018 11:42 AM EDT ----- Alyce Pagan, Your A1c is high, but based on the problems with your medication, it should improve as that gets resolved. Best Regards, Claretta Fraise, M.D.

## 2018-08-11 LAB — CMP14+EGFR
ALT: 26 IU/L (ref 0–32)
AST: 30 IU/L (ref 0–40)
Albumin/Globulin Ratio: 1.7 (ref 1.2–2.2)
Albumin: 4.5 g/dL (ref 3.7–4.7)
Alkaline Phosphatase: 88 IU/L (ref 39–117)
BUN/Creatinine Ratio: 14 (ref 12–28)
BUN: 17 mg/dL (ref 8–27)
Bilirubin Total: 0.5 mg/dL (ref 0.0–1.2)
CO2: 24 mmol/L (ref 20–29)
Calcium: 10.1 mg/dL (ref 8.7–10.3)
Chloride: 100 mmol/L (ref 96–106)
Creatinine, Ser: 1.21 mg/dL — ABNORMAL HIGH (ref 0.57–1.00)
GFR calc Af Amer: 50 mL/min/{1.73_m2} — ABNORMAL LOW (ref >59)
GFR calc non Af Amer: 43 mL/min/{1.73_m2} — ABNORMAL LOW (ref >59)
Globulin, Total: 2.6 g/dL (ref 1.5–4.5)
Glucose: 246 mg/dL — ABNORMAL HIGH (ref 65–99)
Potassium: 3.9 mmol/L (ref 3.5–5.2)
Sodium: 140 mmol/L (ref 134–144)
Total Protein: 7.1 g/dL (ref 6.0–8.5)

## 2018-08-11 LAB — CBC WITH DIFFERENTIAL/PLATELET
Basophils Absolute: 0.1 10*3/uL (ref 0.0–0.2)
Basos: 1 %
EOS (ABSOLUTE): 0.4 10*3/uL (ref 0.0–0.4)
Eos: 4 %
Hematocrit: 45.8 % (ref 34.0–46.6)
Hemoglobin: 15.3 g/dL (ref 11.1–15.9)
Immature Grans (Abs): 0.1 10*3/uL (ref 0.0–0.1)
Immature Granulocytes: 1 %
Lymphocytes Absolute: 4.2 10*3/uL — ABNORMAL HIGH (ref 0.7–3.1)
Lymphs: 46 %
MCH: 30.9 pg (ref 26.6–33.0)
MCHC: 33.4 g/dL (ref 31.5–35.7)
MCV: 93 fL (ref 79–97)
Monocytes Absolute: 0.5 10*3/uL (ref 0.1–0.9)
Monocytes: 6 %
Neutrophils Absolute: 3.8 10*3/uL (ref 1.4–7.0)
Neutrophils: 42 %
Platelets: 148 10*3/uL — ABNORMAL LOW (ref 150–450)
RBC: 4.95 x10E6/uL (ref 3.77–5.28)
RDW: 13.9 % (ref 11.7–15.4)
WBC: 9 10*3/uL (ref 3.4–10.8)

## 2018-08-11 LAB — LIPID PANEL
Chol/HDL Ratio: 5.8 ratio — ABNORMAL HIGH (ref 0.0–4.4)
Cholesterol, Total: 191 mg/dL (ref 100–199)
HDL: 33 mg/dL — ABNORMAL LOW (ref >39)
LDL Calculated: 100 mg/dL — ABNORMAL HIGH (ref 0–99)
Triglycerides: 289 mg/dL — ABNORMAL HIGH (ref 0–149)
VLDL Cholesterol Cal: 58 mg/dL — ABNORMAL HIGH (ref 5–40)

## 2018-08-17 ENCOUNTER — Telehealth: Payer: Self-pay | Admitting: Family Medicine

## 2018-08-17 NOTE — Telephone Encounter (Signed)
Aware of results. 

## 2018-08-26 DIAGNOSIS — Z029 Encounter for administrative examinations, unspecified: Secondary | ICD-10-CM

## 2018-09-05 ENCOUNTER — Other Ambulatory Visit: Payer: Self-pay | Admitting: Family Medicine

## 2018-09-05 ENCOUNTER — Telehealth: Payer: Self-pay | Admitting: Family Medicine

## 2018-09-05 DIAGNOSIS — E1169 Type 2 diabetes mellitus with other specified complication: Secondary | ICD-10-CM

## 2018-09-05 MED ORDER — FLUCONAZOLE 150 MG PO TABS: 150.0000 mg | ORAL_TABLET | Freq: Once | ORAL | 0 refills | Status: AC

## 2018-09-05 NOTE — Telephone Encounter (Signed)
Please refer to endocrine for diabetes. DC farxiga. I sent in diflucan scrip. Nurse please enter referral.

## 2018-09-05 NOTE — Telephone Encounter (Signed)
Would like something with a short needle and something easy to read

## 2018-09-05 NOTE — Telephone Encounter (Signed)
Fasting 230-240 Throughout the day - still go up  [place] at last OV 08/08/18   ( she is used to fasting numbers being around 130)   Tried different meds - and this one is not doing well.  ALSO  Has a yeast infection and needs med sent in

## 2018-09-05 NOTE — Telephone Encounter (Signed)
Pt aware of referral = ordered

## 2018-09-07 ENCOUNTER — Telehealth: Payer: Self-pay | Admitting: Internal Medicine

## 2018-09-07 NOTE — Telephone Encounter (Signed)
New Message   Patient is calling to see if Dr. Debara Pickett received her lab results from her PCP Dr. Claretta Fraise. Please call to discuss.

## 2018-09-07 NOTE — Telephone Encounter (Signed)
I have reviewed the labs - triglycerides are improved (is she taking Vascepa?) - LDL is up - would recommend increasing Praluent to 150 mg q 2 weeks. We will arrange.  Dr. Lemmie Evens

## 2018-09-07 NOTE — Telephone Encounter (Signed)
Pt wants to know if you reviewed her cholesterol results from Dr Livia Snellen on 08/10/2018? Do you need to further change her medications? Pt aware Dr/RN are not in today but will receive a call this week with further advice. Pt was accepting of plan.

## 2018-09-08 MED ORDER — PRALUENT 150 MG/ML ~~LOC~~ SOAJ: 1.0000 | SUBCUTANEOUS | 11 refills | Status: DC

## 2018-09-08 NOTE — Telephone Encounter (Addendum)
Spoke with patient and provided him MD advice based on labs - she is taking vascepa. She is agreeable to praluent dose increase but she has 75mg  pen injectors she just got and would like to go ahead and use these up as the medication is expensive. I told her I would send Rx to [facility] so that way it will be on file for her next refills.

## 2018-09-08 NOTE — Addendum Note (Signed)
Addended by: Fidel Levy on: 09/08/2018 08:48 AM   Modules accepted: Orders

## 2018-09-14 ENCOUNTER — Encounter: Payer: Self-pay | Admitting: Internal Medicine

## 2018-09-14 ENCOUNTER — Other Ambulatory Visit: Payer: Self-pay

## 2018-09-14 ENCOUNTER — Ambulatory Visit (INDEPENDENT_AMBULATORY_CARE_PROVIDER_SITE_OTHER): Payer: Medicare Other | Admitting: Internal Medicine

## 2018-09-14 VITALS — BP 124/68 | HR 63 | Temp 98.3°F | Ht 63.0 in | Wt 184.4 lb

## 2018-09-14 DIAGNOSIS — E1165 Type 2 diabetes mellitus with hyperglycemia: Secondary | ICD-10-CM

## 2018-09-14 DIAGNOSIS — E1122 Type 2 diabetes mellitus with diabetic chronic kidney disease: Secondary | ICD-10-CM

## 2018-09-14 DIAGNOSIS — E1142 Type 2 diabetes mellitus with diabetic polyneuropathy: Secondary | ICD-10-CM | POA: Diagnosis not present

## 2018-09-14 DIAGNOSIS — N183 Chronic kidney disease, stage 3 (moderate): Secondary | ICD-10-CM

## 2018-09-14 MED ORDER — GLIPIZIDE 5 MG PO TABS: 5.0000 mg | ORAL_TABLET | Freq: Every day | ORAL | 1 refills | Status: DC

## 2018-09-14 NOTE — Patient Instructions (Addendum)
-   Stop Januvia  - Continue Farxiga 5 mg ( 1 tablet ) daily  - Start Glipizide 5 mg ( 1 tablet) with Breakfast   - Check sugar before breakfast and before supper    HOW TO TREAT LOW BLOOD SUGARS (Blood sugar LESS THAN 70 MG/DL)  Please follow the RULE OF 15 for the treatment of hypoglycemia treatment (when your (blood sugars are less than 70 mg/dL)    STEP 1: Take 15 grams of carbohydrates when your blood sugar is low, which includes:   3-4 GLUCOSE TABS  OR  3-4 OZ OF JUICE OR REGULAR SODA OR  ONE TUBE OF GLUCOSE GEL     STEP 2: RECHECK blood sugar in 15 MINUTES STEP 3: If your blood sugar is still low at the 15 minute recheck --> then, go back to STEP 1 and treat AGAIN with another 15 grams of carbohydrates.

## 2018-09-14 NOTE — Progress Notes (Signed)
Name: Carmen [name]  MRN/ DOB: [id number], 04-03-1940   Age/ Sex: 78 y.o., female    PCP: Claretta Fraise, MD   Reason for Endocrinology Evaluation: Type 2 Diabetes Mellitus     Date of Initial Endocrinology Visit: 09/14/2018     PATIENT IDENTIFIER: Carmen [name] is a 78 y.o. female with a past medical history of HTN, PVC, neuromuscular disorder, hemochromatosis and CAD (S/P CABG). The patient presented for initial endocrinology clinic visit on 09/14/2018 for consultative assistance with her diabetes management.    HPI: Carmen [name] was    Diagnosed with T2DM in 2016 Prior Medications tried/Intolerance: Januvia- swelling , Trulicity - nausea , metformin - elevated LFT's  Currently checking blood sugars 2 x / day,  before breakfast and bedtime  Hypoglycemia episodes : no              Hemoglobin A1c has ranged from 6.6% in 2016, peaking at 8.6% in 2020. Patient required assistance for hypoglycemia: no Patient has required hospitalization within the last 1 year from hyper or hypoglycemia: no  In terms of diet, the patient drinks juice and sweet tea, eats 3 meals a day and rarely snacks.    HOME DIABETES REGIMEN: Farxiga 5 mg daily  Januvia 100 mg daily    Statin: no - intolerant to statins  ACE-I/ARB: yes Prior Diabetic Education: no   GLUCOSE LOG:  BG's > 200 mg/dL   DIABETIC COMPLICATIONS: Microvascular complications:   CKD III, neuropathy   Denies: retinopathy   Last eye exam: Completed   Macrovascular complications:   CABG  Denies: PVD, CVA   PAST HISTORY: Past Medical History:  Past Medical History:  Diagnosis Date  . Arthritis   . Asymptomatic PVCs   . Bilateral shoulder pain   . CAD (coronary artery disease)   . Cancer ([place]) 11/2015   melanomax4  right upper arm  . Chronic renal insufficiency, stage 3 (moderate) (HCC)   . Diabetes mellitus without complication ([place])   . Diverticulosis   . Endometrial polyp   . External  hemorrhoids   . Gout   . Hemochromatosis, hereditary ([place]) 11/24/2012  . History of anemia   . History of duodenal ulcer 1990  . Hyperlipidemia   . Hypertension   . Internal hemorrhoids   . Jaundice    age 38 or 76  . Myocardial infarction ([place]) 2007  . Neuromuscular disorder ([place])    peripheral neuropathy  . PMB (postmenopausal bleeding)   . PONV (postoperative nausea and vomiting)   . Prolapse of female pelvic organs    uses pessary  . Seizures ([place])    had one at Dr. Antonieta Pert office after getting blood drawn  . Tick bite 08/12/2017   had 3 bites  . Vitamin D deficiency    Past Surgical History:  Past Surgical History:  Procedure Laterality Date  . BREAST SURGERY     left breast lump--benign  . CARDIAC CATHETERIZATION  06/03/2005  . COLONOSCOPY  11/28/2001  . CORONARY ARTERY BYPASS GRAFT  2007   x2 Dr. Roxan Hockey, LIMA to LAD, SVG to PDA  . DILATATION & CURETTAGE/HYSTEROSCOPY WITH MYOSURE N/A 09/28/2017   Procedure: DILATATION & CURETTAGE/HYSTEROSCOPY WITH MYOSURE;  Surgeon: Molli Posey, MD;  Location: [facility];  Service: Gynecology;  Laterality: N/A;  . LIPOMA EXCISION     back  . UPPER GI ENDOSCOPY  01/20/1989      Social History:  reports that she has never smoked. She has never  used smokeless tobacco. She reports current alcohol use. She reports that she does not use drugs. Family History:  Family History  Problem Relation Age of Onset  . Stroke Mother   . Hypertension Mother   . Neuropathy Mother   . Stroke Father   . Hypertension Father   . Diabetes Father   . Cancer Sister 42       sarcoma  . Diabetes Brother   . Arthritis Sister   . Arthritis Sister   . Lupus Sister   . Hemachromatosis Sister   . Arthritis Sister   . Hemachromatosis Sister   . Hemachromatosis Sister   . Diverticulitis Sister   . Diabetes Brother   . Diabetes Brother   . Hemachromatosis Brother      HOME MEDICATIONS: Allergies as of 09/14/2018       Reactions   Cymbalta [duloxetine Hcl] Other (See Comments)   Profuse sweating   Januvia [sitagliptin] Swelling   Meloxicam Nausea And Vomiting   Trulicity [dulaglutide] Nausea Only   Diclofenac Diarrhea   Shellfish Allergy Other (See Comments)   Gout   Tape    Causes skin irritation   Crestor [rosuvastatin] Other (See Comments)   Muscle aches on 5 mg daily and 10 mg twice weekly   Lipitor [atorvastatin] Other (See Comments)   Muscle aches   Livalo [pitavastatin] Other (See Comments)      Sulfa Antibiotics Rash   Zetia [ezetimibe] Other (See Comments)   Muscle aches   Zocor [simvastatin] Other (See Comments)   Muscle aches       Medication List       Accurate as of September 14, 2018  1:48 PM. If you have any questions, ask your nurse or doctor.        Accu-Chek [employer] Kit Use to check Blood Sugars   allopurinol 100 MG tablet Commonly known as: ZYLOPRIM Take 1 tablet (100 mg total) by mouth 2 (two) times daily.   aspirin 81 MG tablet Take 81 mg by mouth daily.   colchicine 0.6 MG tablet TAKE (1) TABLET DAILY AS NEEDED.   doxycycline 100 MG capsule Commonly known as: Vibramycin Take 2 tablets at one time to prevent tick bite infection.   Farxiga 5 MG Tabs tablet Generic drug: dapagliflozin propanediol   fluticasone 50 MCG/ACT nasal spray Commonly known as: FLONASE SPRAY 1 SPRAY IN EACH NOSTRIL ONCE DAILY.   furosemide 20 MG tablet Commonly known as: LASIX Take 1 tablet (20 mg total) by mouth daily.   glucose blood test strip Commonly known as: ONE TOUCH ULTRA TEST CHECK BLOOD SUGAR 2 TIMES A DAY   Icosapent Ethyl 1 g Caps Commonly known as: Vascepa Take 2 capsules (2 g total) by mouth 2 (two) times daily.   Januvia 100 MG tablet Generic drug: sitaGLIPtin   metoprolol tartrate 25 MG tablet Commonly known as: LOPRESSOR Take 1 tablet (25 mg total) by mouth 2 (two) times daily.   olmesartan 20 MG tablet Commonly known as: BENICAR Take 1 tablet  (20 mg total) by mouth daily.   Praluent 150 MG/ML [place] Generic drug: Alirocumab Inject 1 Dose into the skin every 14 (fourteen) days.   pregabalin 50 MG capsule Commonly known as: LYRICA Take 1 capsule (50 mg total) by mouth 2 (two) times daily.   Vitamin D (Ergocalciferol) 1.25 MG (50000 UT) Caps capsule Commonly known as: DRISDOL Take 1 capsule (50,000 Units total) by mouth 2 (two) times a week.   Voltaren 1 %  Gel Generic drug: diclofenac sodium APPLY 4 GRAMS TO AFFECTED AREA(S) 4 TIMES A DAY AS NEEDED        ALLERGIES: Allergies  Allergen Reactions  . Cymbalta [Duloxetine Hcl] Other (See Comments)    Profuse sweating  . Januvia [Sitagliptin] Swelling  . Meloxicam Nausea And Vomiting  . Trulicity [Dulaglutide] Nausea Only  . Diclofenac Diarrhea  . Shellfish Allergy Other (See Comments)    Gout  . Tape     Causes skin irritation  . Crestor [Rosuvastatin] Other (See Comments)    Muscle aches on 5 mg daily and 10 mg twice weekly  . Lipitor [Atorvastatin] Other (See Comments)    Muscle aches  . Livalo [Pitavastatin] Other (See Comments)       . Sulfa Antibiotics Rash  . Zetia [Ezetimibe] Other (See Comments)    Muscle aches  . Zocor [Simvastatin] Other (See Comments)    Muscle aches      REVIEW OF SYSTEMS: A comprehensive ROS was conducted with the patient and is negative except as per HPI and below:  Review of Systems  Constitutional: Positive for malaise/fatigue. Negative for fever.  HENT: Negative for congestion and sore throat.   Eyes: Negative for blurred vision and pain.  Respiratory: Negative for cough and shortness of breath.   Cardiovascular: Negative for chest pain and palpitations.  Gastrointestinal: Negative for diarrhea and nausea.  Genitourinary: Negative for frequency.  Neurological: Positive for dizziness and tingling. Negative for tremors.  Endo/Heme/Allergies: Negative for polydipsia.  Psychiatric/Behavioral: Negative for depression. The  patient is not nervous/anxious.       OBJECTIVE:   VITAL SIGNS: BP 124/68 (BP Location: Left Arm, Patient Position: Sitting, Cuff Size: Large)   Pulse 63   Temp 98.3 F (36.8 C)   Ht 5' 3" (1.6 m)   Wt 184 lb 6.4 oz (83.6 kg)   SpO2 96%   BMI 32.66 kg/m    PHYSICAL EXAM:  General: Pt appears well and is in NAD  Hydration: Well-hydrated with moist mucous membranes and good skin turgor  HEENT: Head: Unremarkable with good dentition. Oropharynx clear without exudate.  Eyes: External eye exam normal without stare, lid lag or exophthalmos.  EOM intact.  PERRL.  Neck: General: Supple without adenopathy or carotid bruits. Thyroid: Thyroid size normal.  No goiter or nodules appreciated. No thyroid bruit.  Lungs: Clear with good BS bilat with no rales, rhonchi, or wheezes  Heart: RRR with normal S1 and S2 and no gallops; no murmurs; no rub  Abdomen: Normoactive bowel sounds, soft, nontender, without masses or organomegaly palpable  Extremities:  Lower extremities - non-pitting pretibial edema.   Skin: Normal texture and temperature to palpation. No rash noted. No Acanthosis nigricans/skin tags. No lipohypertrophy.  Neuro: MS is good with appropriate affect, pt is alert and Ox3    DM foot exam: 09/14/2018  The skin of the feet is intact without sores or ulcerations. The pedal pulses are 2+ on right and 2+ on left. The sensation is decreased to a screening 5.07, 10 gram monofilament bilaterally   DATA REVIEWED:  Lab Results  Component Value Date   HGBA1C 8.6 (H) 08/10/2018   HGBA1C 8.6 (H) 05/11/2018   HGBA1C 7.4 (H) 12/20/2017   Lab Results  Component Value Date   MICROALBUR neg 02/28/2013   LDLCALC 100 (H) 08/10/2018   CREATININE 1.21 (H) 08/10/2018    Lab Results  Component Value Date   CHOL 191 08/10/2018   HDL 33 (L) 08/10/2018  LDLCALC 100 (H) 08/10/2018   TRIG 289 (H) 08/10/2018   CHOLHDL 5.8 (H) 08/10/2018        ASSESSMENT / PLAN / RECOMMENDATIONS:   1)  Type 2 Diabetes Mellitus, poorly controlled, With neuropathic and CKD III complications - Most recent A1c of 8.6 %. Goal A1c < 7.5 %.    Plan: GENERAL: I have discussed with the patient the pathophysiology of diabetes. We went over the natural progression of the disease. We talked about both insulin resistance and insulin deficiency. We stressed the importance of lifestyle changes including diet and exercise. I explained the complications associated with diabetes including retinopathy, nephropathy, neuropathy as well as increased risk of cardiovascular disease. We went over the benefit seen with glycemic control.    I explained to the patient that diabetic patients are at higher than normal risk for amputations.  We discussed the importance of diet in controlling diabetes and avoiding sugar-sweetened beverages as well as avoiding snacks when possible.   Will refer her to our CDE  We are limited by her low GFR and intolerance to multiple medications  She is intolerant to trulicity due to nausea in the past, Metformin had to stop per pt due to elevated LFT's, she also stated that [place] makes her have unsteady gate and would like to get off it, I tried to explain to her that [place] can cause dizziness but she believes its the Januvia .  We discussed risk of hypoglycemia with glipizide.    MEDICATIONS:  Stop Januvia  Continue Farxiga 5 mg daily   Start Glipizide 5 mg daily   EDUCATION / INSTRUCTIONS:  BG monitoring instructions: Patient is instructed to check her blood sugars 2 times a day, fasting and supper time.  Call [facility] if: BG persistently < 70 or > 300. . I reviewed the Rule of 15 for the treatment of hypoglycemia in detail with the patient. Literature supplied.   2) Diabetic complications:   Eye: Does not have known diabetic retinopathy.   Neuro/ Feet: Does have known diabetic peripheral neuropathy.  Renal: Patient does have known baseline CKD.  She is on an ACEI/ARB at present.  3) Lipids: Patient is intolerant to  statin.    4) Hypertension: She is at goal of < 140/90 mmHg.     F/u in 6 weeks     Signed electronically by: Mack Guise, MD  [facility]  [address] [address] [place], [place] [address] Phone: [phone] FAX: [phone]   CC: Claretta Fraise, MD [place] [address] Phone: [phone]  Fax: [phone]    Return to Endocrinology clinic as below: Future Appointments  Date Time Provider [place]  09/22/2018 11:45 AM [facility] None  09/22/2018 12:00 PM [place], Rudell Cobb, MD [facility] None

## 2018-09-20 ENCOUNTER — Other Ambulatory Visit: Payer: Self-pay | Admitting: Family Medicine

## 2018-09-20 NOTE — Telephone Encounter (Signed)
Please review.  Discontinued by pcp 07/2018

## 2018-09-21 ENCOUNTER — Other Ambulatory Visit: Payer: Self-pay | Admitting: *Deleted

## 2018-09-22 ENCOUNTER — Inpatient Hospital Stay: Payer: Medicare Other | Attending: Hematology & Oncology | Admitting: Hematology & Oncology

## 2018-09-22 ENCOUNTER — Encounter: Payer: Self-pay | Admitting: Hematology & Oncology

## 2018-09-22 ENCOUNTER — Other Ambulatory Visit: Payer: Self-pay

## 2018-09-22 ENCOUNTER — Inpatient Hospital Stay: Payer: Medicare Other

## 2018-09-22 DIAGNOSIS — Z7984 Long term (current) use of oral hypoglycemic drugs: Secondary | ICD-10-CM | POA: Diagnosis not present

## 2018-09-22 DIAGNOSIS — Z79899 Other long term (current) drug therapy: Secondary | ICD-10-CM | POA: Insufficient documentation

## 2018-09-22 DIAGNOSIS — Z7982 Long term (current) use of aspirin: Secondary | ICD-10-CM | POA: Insufficient documentation

## 2018-09-22 DIAGNOSIS — E1165 Type 2 diabetes mellitus with hyperglycemia: Secondary | ICD-10-CM

## 2018-09-22 LAB — CBC WITH DIFFERENTIAL (CANCER CENTER ONLY)
Abs Immature Granulocytes: 0.03 10*3/uL (ref 0.00–0.07)
Basophils Absolute: 0 10*3/uL (ref 0.0–0.1)
Basophils Relative: 1 %
Eosinophils Absolute: 0.3 10*3/uL (ref 0.0–0.5)
Eosinophils Relative: 3 %
HCT: 45 % (ref 36.0–46.0)
Hemoglobin: 15.2 g/dL — ABNORMAL HIGH (ref 12.0–15.0)
Immature Granulocytes: 0 %
Lymphocytes Relative: 38 %
Lymphs Abs: 3.2 10*3/uL (ref 0.7–4.0)
MCH: 31 pg (ref 26.0–34.0)
MCHC: 33.8 g/dL (ref 30.0–36.0)
MCV: 91.8 fL (ref 80.0–100.0)
Monocytes Absolute: 0.5 10*3/uL (ref 0.1–1.0)
Monocytes Relative: 5 %
Neutro Abs: 4.5 10*3/uL (ref 1.7–7.7)
Neutrophils Relative %: 53 %
Platelet Count: 158 10*3/uL (ref 150–400)
RBC: 4.9 MIL/uL (ref 3.87–5.11)
RDW: 13.3 % (ref 11.5–15.5)
WBC Count: 8.6 10*3/uL (ref 4.0–10.5)
nRBC: 0 % (ref 0.0–0.2)

## 2018-09-22 LAB — CMP (CANCER CENTER ONLY)
ALT: 24 U/L (ref 0–44)
AST: 28 U/L (ref 15–41)
Albumin: 4.6 g/dL (ref 3.5–5.0)
Alkaline Phosphatase: 84 U/L (ref 38–126)
Anion gap: 11 (ref 5–15)
BUN: 23 mg/dL (ref 8–23)
CO2: 25 mmol/L (ref 22–32)
Calcium: 10.9 mg/dL — ABNORMAL HIGH (ref 8.9–10.3)
Chloride: 102 mmol/L (ref 98–111)
Creatinine: 1.32 mg/dL — ABNORMAL HIGH (ref 0.44–1.00)
GFR, Est AFR Am: 45 mL/min — ABNORMAL LOW (ref >60)
GFR, Estimated: 39 mL/min — ABNORMAL LOW (ref >60)
Glucose, Bld: 206 mg/dL — ABNORMAL HIGH (ref 70–99)
Potassium: 4.1 mmol/L (ref 3.5–5.1)
Sodium: 138 mmol/L (ref 135–145)
Total Bilirubin: 0.5 mg/dL (ref 0.3–1.2)
Total Protein: 7.4 g/dL (ref 6.5–8.1)

## 2018-09-22 NOTE — Patient Instructions (Signed)

## 2018-09-22 NOTE — Progress Notes (Signed)
Hematology and Oncology Follow Up Visit  Carmen [name] [id number] April 08, 1940 78 y.o. 09/22/2018   Principle Diagnosis:  Hemochromatosis (double heterozygote for C282Y and S65C mutations).  Current Therapy:    Phlebotomy to maintain ferritin less than 100     Interim History:  [name] is back for followup.  Shockingly, it is been a year that she has been here.  I am not sure why she did not come back sooner.  I think of the coronavirus may have been something to do with it.  She is now a great grandmother, again.  This is very exciting for her.  She is doing okay.  She still is having a tough time with the death of her husband that was 2 and half years ago.  I know that she misses him quite a bit.  She is having problems with her blood sugar.  I am not sure as to why.  It sounds like she was put on some prednisone and then her blood sugars have continued to elevate.  Her endocrinologist is trying to help with this.  As far as the hemochromatosis goes she has done pretty well with this up to now.  Since it has been a year, I suspect her iron is been be on the high side.  We will go ahead and phlebotomize her today.  I think it would be reasonable since she is here already.    Overall, I say her performance status is ECOG 1.    Medications:  Current Outpatient Medications:  .  Alirocumab (PRALUENT) 150 MG/ML SOAJ, Inject 1 Dose into the skin every 14 (fourteen) days., Disp: 2 pen, Rfl: 11 .  allopurinol (ZYLOPRIM) 100 MG tablet, Take 1 tablet (100 mg total) by mouth 2 (two) times daily., Disp: 180 tablet, Rfl: 1 .  aspirin 81 MG tablet, Take 81 mg by mouth daily., Disp: , Rfl:  .  colchicine 0.6 MG tablet, TAKE (1) TABLET DAILY AS NEEDED., Disp: 30 tablet, Rfl: 1 .  doxycycline (VIBRAMYCIN) 100 MG capsule, Take 2 tablets at one time to prevent tick bite infection. (Patient not taking: Reported on 09/14/2018), Disp: 2 capsule, Rfl: 0 .  FARXIGA 5 MG TABS tablet, , Disp: , Rfl:   .  fluticasone (FLONASE) 50 MCG/ACT nasal spray, SPRAY 1 SPRAY IN EACH NOSTRIL ONCE DAILY., Disp: 16 g, Rfl: 4 .  furosemide (LASIX) 20 MG tablet, Take 1 tablet (20 mg total) by mouth daily., Disp: 30 tablet, Rfl: 11 .  glipiZIDE (GLUCOTROL) 5 MG tablet, Take 1 tablet (5 mg total) by mouth daily before breakfast., Disp: 60 tablet, Rfl: 1 .  glucose blood (ONE TOUCH ULTRA TEST) test strip, CHECK BLOOD SUGAR 2 TIMES A DAY, Disp: 100 each, Rfl: prn .  Icosapent Ethyl (VASCEPA) 1 g CAPS, Take 2 capsules (2 g total) by mouth 2 (two) times daily., Disp: 120 capsule, Rfl: 11 .  Lancets Misc. (ACCU-CHEK FASTCLIX LANCET) KIT, Use to check Blood Sugars, Disp: 1 kit, Rfl: 0 .  metoprolol tartrate (LOPRESSOR) 25 MG tablet, Take 1 tablet (25 mg total) by mouth 2 (two) times daily., Disp: 180 tablet, Rfl: 1 .  olmesartan (BENICAR) 20 MG tablet, Take 1 tablet (20 mg total) by mouth daily., Disp: 90 tablet, Rfl: 1 .  pregabalin (LYRICA) 50 MG capsule, Take 1 capsule (50 mg total) by mouth 2 (two) times daily., Disp: 60 capsule, Rfl: 2 .  Vitamin D, Ergocalciferol, (DRISDOL) 1.25 MG (50000 UT) CAPS capsule, Take  1 capsule (50,000 Units total) by mouth 2 (two) times a week., Disp: 26 capsule, Rfl: 3 .  VOLTAREN 1 % GEL, APPLY 4 GRAMS TO AFFECTED AREA(S) 4 TIMES A DAY AS NEEDED, Disp: 100 g, Rfl: 2  Allergies:  Allergies  Allergen Reactions  . Cymbalta [Duloxetine Hcl] Other (See Comments)    Profuse sweating  . Januvia [Sitagliptin] Swelling  . Meloxicam Nausea And Vomiting  . Trulicity [Dulaglutide] Nausea Only  . Diclofenac Diarrhea  . Shellfish Allergy Other (See Comments)    Gout  . Tape     Causes skin irritation  . Crestor [Rosuvastatin] Other (See Comments)    Muscle aches on 5 mg daily and 10 mg twice weekly  . Lipitor [Atorvastatin] Other (See Comments)    Muscle aches  . Livalo [Pitavastatin] Other (See Comments)       . Sulfa Antibiotics Rash  . Zetia [Ezetimibe] Other (See Comments)     Muscle aches  . Zocor [Simvastatin] Other (See Comments)    Muscle aches     Past Medical History, Surgical history, Social history, and Family History were reviewed and updated.  Review of Systems: Review of Systems  Constitutional: Negative.   HENT: Negative.   Eyes: Negative.   Respiratory: Negative.   Cardiovascular: Negative.   Gastrointestinal: Negative.   Genitourinary: Negative.   Musculoskeletal: Negative.   Skin: Negative.   Neurological: Negative.   Endo/Heme/Allergies: Negative.   Psychiatric/Behavioral: Negative.     Physical Exam:  weight is 182 lb (82.6 kg). Her oral temperature is 97.7 F (36.5 C). Her blood pressure is 148/67 (abnormal) and her pulse is 56 (abnormal). Her respiration is 19 and oxygen saturation is 95%.   Physical Exam Vitals signs reviewed.  HENT:     Head: Normocephalic and atraumatic.  Eyes:     Pupils: Pupils are equal, round, and reactive to light.  Neck:     Musculoskeletal: Normal range of motion.  Cardiovascular:     Rate and Rhythm: Normal rate and regular rhythm.     Heart sounds: Normal heart sounds.  Pulmonary:     Effort: Pulmonary effort is normal.     Breath sounds: Normal breath sounds.  Abdominal:     General: Bowel sounds are normal.     Palpations: Abdomen is soft.  Musculoskeletal: Normal range of motion.        General: No tenderness or deformity.  Lymphadenopathy:     Cervical: No cervical adenopathy.  Skin:    General: Skin is warm and dry.     Findings: No erythema or rash.  Neurological:     Mental Status: She is alert and oriented to person, place, and time.  Psychiatric:        Behavior: Behavior normal.        Thought Content: Thought content normal.        Judgment: Judgment normal.      Lab Results  Component Value Date   WBC 8.6 09/22/2018   HGB 15.2 (H) 09/22/2018   HCT 45.0 09/22/2018   MCV 91.8 09/22/2018   PLT 158 09/22/2018     Chemistry      Component Value Date/Time   NA 138  09/22/2018 1148   NA 140 08/10/2018 1010   NA 147 (H) 01/22/2017 1451   NA 140 12/31/2015 1052   K 4.1 09/22/2018 1148   K 4.9 (H) 01/22/2017 1451   K 4.2 12/31/2015 1052   CL 102 09/22/2018 1148   CL  106 01/22/2017 1451   CO2 25 09/22/2018 1148   CO2 29 01/22/2017 1451   CO2 23 12/31/2015 1052   BUN 23 09/22/2018 1148   BUN 17 08/10/2018 1010   BUN 23 (H) 01/22/2017 1451   BUN 21.4 12/31/2015 1052   CREATININE 1.32 (H) 09/22/2018 1148   CREATININE 1.6 (H) 01/22/2017 1451   CREATININE 1.4 (H) 12/31/2015 1052      Component Value Date/Time   CALCIUM 10.9 (H) 09/22/2018 1148   CALCIUM 10.3 01/22/2017 1451   CALCIUM 9.7 12/31/2015 1052   ALKPHOS 84 09/22/2018 1148   ALKPHOS 65 01/22/2017 1451   ALKPHOS 87 12/31/2015 1052   AST 28 09/22/2018 1148   AST 31 12/31/2015 1052   ALT 24 09/22/2018 1148   ALT 25 01/22/2017 1451   ALT 36 12/31/2015 1052   BILITOT 0.5 09/22/2018 1148   BILITOT 0.42 12/31/2015 1052        Impression and Plan: Carmen [name] is 78 year old white female with hemachromatosis. She is a double heterozygote.  I will go ahead and phlebotomize her today.  I am sure her iron is going to be on the high side.  I cannot imagine that hemochromatosis would be a problem with respect to her diabetes.  Again, we will control the hemochromatosis well.  I would like to see her back in another 3 months.  Volanda Napoleon, MD 7/2/202012:59 PM

## 2018-09-26 ENCOUNTER — Telehealth: Payer: Self-pay | Admitting: Hematology & Oncology

## 2018-09-26 ENCOUNTER — Telehealth: Payer: Self-pay | Admitting: Internal Medicine

## 2018-09-26 LAB — IRON AND TIBC
Iron: 120 ug/dL (ref 41–142)
Saturation Ratios: 41 % (ref 21–57)
TIBC: 290 ug/dL (ref 236–444)
UIBC: 170 ug/dL (ref 120–384)

## 2018-09-26 LAB — FERRITIN: Ferritin: 130 ng/mL (ref 11–307)

## 2018-09-26 NOTE — Telephone Encounter (Signed)
Informed pt and she stated that she will reach out to her PCP if this keeps occuring

## 2018-09-26 NOTE — Telephone Encounter (Signed)
Please advise 

## 2018-09-26 NOTE — Telephone Encounter (Signed)
Called and spoke with patient regarding appointments added per 7/6 staff message

## 2018-09-26 NOTE — Telephone Encounter (Signed)
Pt having problem with vertigo when she bends over esp when shes in a chair and bends towards the ground asking if it could be due to the new medication?

## 2018-09-29 ENCOUNTER — Telehealth: Payer: Self-pay | Admitting: *Deleted

## 2018-09-29 ENCOUNTER — Telehealth: Payer: Self-pay | Admitting: Family Medicine

## 2018-09-29 NOTE — Telephone Encounter (Signed)
Patient aware she needs to discuss with provider. Appointment scheduled for tomorrow 07/10 for virtual.

## 2018-09-29 NOTE — Telephone Encounter (Signed)
Call received from patient stating that she has had vertigo since phlebotomy done on 09/22/18 and is wondering if it is related to her phlebotomy.  Dr. Marin Olp notified about pt.'s complaints.  Call placed back to patient and patient notified that Dr. Marin Olp does not feel as though her vertigo would be coming from her phlebotomy, to hold phlebotomy scheduled for tomorrow and to contact her PCP regarding vertigo.  Pt appreciative of call and states that she will contact her PCP regarding her vertigo.

## 2018-09-30 ENCOUNTER — Other Ambulatory Visit: Payer: Self-pay

## 2018-09-30 ENCOUNTER — Ambulatory Visit (INDEPENDENT_AMBULATORY_CARE_PROVIDER_SITE_OTHER): Payer: Medicare Other | Admitting: Family Medicine

## 2018-09-30 ENCOUNTER — Encounter: Payer: Self-pay | Admitting: Family Medicine

## 2018-09-30 DIAGNOSIS — R42 Dizziness and giddiness: Secondary | ICD-10-CM | POA: Diagnosis not present

## 2018-09-30 MED ORDER — MECLIZINE HCL 12.5 MG PO TABS: 12.5000 mg | ORAL_TABLET | Freq: Three times a day (TID) | ORAL | 0 refills | Status: DC | PRN

## 2018-09-30 NOTE — Progress Notes (Signed)
Virtual Visit via telephone Note Due to COVID-19, visit is conducted virtually and was requested by patient. This visit type was conducted due to national recommendations for restrictions regarding the COVID-19 Pandemic (e.g. social distancing) in an effort to limit this patient's exposure and mitigate transmission in our community. All issues noted in this document were discussed and addressed.  A physical exam was not performed with this format.   I connected with Carmen [name] on 09/30/18 at 1035 by telephone and verified that I am speaking with the correct person using two identifiers. Carmen [name] is currently located at home and family is currently with them during visit. The provider, Monia Pouch, FNP is located in their office at time of visit.  I discussed the limitations, risks, security and privacy concerns of performing an evaluation and management service by telephone and the availability of in person appointments. I also discussed with the patient that there may be a patient responsible charge related to this service. The patient expressed understanding and agreed to proceed.  Subjective:  Patient ID: Carmen [name], female    DOB: 1940/11/22, 78 y.o.   MRN: [id number]  Chief Complaint:  Dizziness   HPI: Carmen [name] is a 78 y.o. female presenting on 09/30/2018 for Dizziness   Pt reports intermittent dizziness that is worse with bending over, rolling over in bed, and with standing. Pt states this started over a week ago. She does have nausea at times with the dizziness. States the dizziness will last for a few seconds at a times. No visual changes or focal deficits. No slurred speech, headaches, confusion, weakness, or chest pain. No palpitations or syncope.   Dizziness This is a new problem. The current episode started 1 to 4 weeks ago. The problem occurs intermittently. The problem has been waxing and waning. Associated symptoms include nausea and vertigo.  Pertinent negatives include no abdominal pain, anorexia, arthralgias, change in bowel habit, chest pain, chills, congestion, coughing, diaphoresis, fatigue, fever, headaches, joint swelling, myalgias, neck pain, numbness, rash, sore throat, swollen glands, urinary symptoms, visual change, vomiting or weakness. The symptoms are aggravated by bending, twisting and standing. She has tried nothing for the symptoms.     Relevant past medical, surgical, family, and social history reviewed and updated as indicated.  Allergies and medications reviewed and updated.   Past Medical History:  Diagnosis Date  . Arthritis   . Asymptomatic PVCs   . Bilateral shoulder pain   . CAD (coronary artery disease)   . Cancer ([place]) 11/2015   melanomax4  right upper arm  . Chronic renal insufficiency, stage 3 (moderate) (HCC)   . Diabetes mellitus without complication ([place])   . Diverticulosis   . Endometrial polyp   . External hemorrhoids   . Gout   . Hemochromatosis, hereditary ([place]) 11/24/2012  . History of anemia   . History of duodenal ulcer 1990  . Hyperlipidemia   . Hypertension   . Internal hemorrhoids   . Jaundice    age 20 or 48  . Myocardial infarction ([place]) 2007  . Neuromuscular disorder ([place])    peripheral neuropathy  . PMB (postmenopausal bleeding)   . PONV (postoperative nausea and vomiting)   . Prolapse of female pelvic organs    uses pessary  . Seizures ([place])    had one at Dr. Antonieta Pert office after getting blood drawn  . Tick bite 08/12/2017   had 3 bites  . Vitamin D deficiency     Past  Surgical History:  Procedure Laterality Date  . BREAST SURGERY     left breast lump--benign  . CARDIAC CATHETERIZATION  06/03/2005  . COLONOSCOPY  11/28/2001  . CORONARY ARTERY BYPASS GRAFT  2007   x2 Dr. Roxan Hockey, [place] to LAD, [place]G to PDA  . DILATATION & CURETTAGE/HYSTEROSCOPY WITH MYOSURE N/A 09/28/2017   Procedure: DILATATION & CURETTAGE/HYSTEROSCOPY WITH MYOSURE;  Surgeon: Molli Posey, MD;  Location: [facility];  Service: Gynecology;  Laterality: N/A;  . LIPOMA EXCISION     back  . UPPER GI ENDOSCOPY  01/20/1989    Social History   Socioeconomic History  . Marital status: Married    Spouse name: Not on file  . Number of children: Not on file  . Years of education: Not on file  . Highest education level: Not on file  Occupational History  . Not on file  Social Needs  . Financial resource strain: Not on file  . Food insecurity    Worry: Not on file    Inability: Not on file  . Transportation needs    Medical: Not on file    Non-medical: Not on file  Tobacco Use  . Smoking status: Never Smoker  . Smokeless tobacco: Never Used  . Tobacco comment: never used tobacco  Substance and Sexual Activity  . Alcohol use: Yes    Alcohol/week: 0.0 standard drinks    Comment: rare wine  . Drug use: No  . Sexual activity: Not Currently    Birth control/protection: Post-menopausal  Lifestyle  . Physical activity    Days per week: Not on file    Minutes per session: Not on file  . Stress: Not on file  Relationships  . Social Herbalist on phone: Not on file    Gets together: Not on file    Attends religious service: Not on file    Active member of club or organization: Not on file    Attends meetings of clubs or organizations: Not on file    Relationship status: Not on file  . Intimate partner violence    Fear of current or ex partner: Not on file    Emotionally abused: Not on file    Physically abused: Not on file    Forced sexual activity: Not on file  Other Topics Concern  . Not on file  Social History Narrative  . Not on file    Outpatient Encounter Medications as of 09/30/2018  Medication Sig  . Alirocumab (PRALUENT) 150 MG/ML SOAJ Inject 1 Dose into the skin every 14 (fourteen) days.  Marland Kitchen allopurinol (ZYLOPRIM) 100 MG tablet Take 1 tablet (100 mg total) by mouth 2 (two) times daily.  Marland Kitchen aspirin 81 MG tablet Take 81 mg  by mouth daily.  . colchicine 0.6 MG tablet TAKE (1) TABLET DAILY AS NEEDED.  Marland Kitchen doxycycline (VIBRAMYCIN) 100 MG capsule Take 2 tablets at one time to prevent tick bite infection. (Patient not taking: Reported on 09/14/2018)  . FARXIGA 5 MG TABS tablet   . fluticasone (FLONASE) 50 MCG/ACT nasal spray SPRAY 1 SPRAY IN EACH NOSTRIL ONCE DAILY.  . furosemide (LASIX) 20 MG tablet Take 1 tablet (20 mg total) by mouth daily.  Marland Kitchen glipiZIDE (GLUCOTROL) 5 MG tablet Take 1 tablet (5 mg total) by mouth daily before breakfast.  . glucose blood (ONE TOUCH ULTRA TEST) test strip CHECK BLOOD SUGAR 2 TIMES A DAY  . Icosapent Ethyl (VASCEPA) 1 g CAPS Take 2 capsules (2 g  total) by mouth 2 (two) times daily.  . Lancets Misc. (ACCU-CHEK FASTCLIX LANCET) KIT Use to check Blood Sugars  . meclizine (ANTIVERT) 12.5 MG tablet Take 1 tablet (12.5 mg total) by mouth 3 (three) times daily as needed for dizziness.  . metoprolol tartrate (LOPRESSOR) 25 MG tablet Take 1 tablet (25 mg total) by mouth 2 (two) times daily.  Marland Kitchen olmesartan (BENICAR) 20 MG tablet Take 1 tablet (20 mg total) by mouth daily.  . pregabalin (LYRICA) 50 MG capsule Take 1 capsule (50 mg total) by mouth 2 (two) times daily.  . Vitamin D, Ergocalciferol, (DRISDOL) 1.25 MG (50000 UT) CAPS capsule Take 1 capsule (50,000 Units total) by mouth 2 (two) times a week.  . VOLTAREN 1 % GEL APPLY 4 GRAMS TO AFFECTED AREA(S) 4 TIMES A DAY AS NEEDED   No facility-administered encounter medications on file as of 09/30/2018.     Allergies  Allergen Reactions  . Cymbalta [Duloxetine Hcl] Other (See Comments)    Profuse sweating  . Januvia [Sitagliptin] Swelling  . Meloxicam Nausea And Vomiting  . Trulicity [Dulaglutide] Nausea Only  . Diclofenac Diarrhea  . Shellfish Allergy Other (See Comments)    Gout  . Tape     Causes skin irritation  . Crestor [Rosuvastatin] Other (See Comments)    Muscle aches on 5 mg daily and 10 mg twice weekly  . Lipitor [Atorvastatin]  Other (See Comments)    Muscle aches  . Livalo [Pitavastatin] Other (See Comments)       . Sulfa Antibiotics Rash  . Zetia [Ezetimibe] Other (See Comments)    Muscle aches  . Zocor [Simvastatin] Other (See Comments)    Muscle aches     Review of Systems  Constitutional: Negative for chills, diaphoresis, fatigue and fever.  HENT: Negative for congestion and sore throat.   Eyes: Negative for photophobia and visual disturbance.  Respiratory: Negative for cough, chest tightness and shortness of breath.   Cardiovascular: Negative for chest pain and palpitations.  Gastrointestinal: Positive for nausea. Negative for abdominal pain, anorexia, change in bowel habit and vomiting.  Musculoskeletal: Negative for arthralgias, joint swelling, myalgias and neck pain.  Skin: Negative for rash.  Neurological: Positive for dizziness and vertigo. Negative for tremors, seizures, syncope, facial asymmetry, speech difficulty, weakness, light-headedness, numbness and headaches.  Psychiatric/Behavioral: Negative for confusion.  All other systems reviewed and are negative.        Observations/Objective: No vital signs or physical exam, this was a telephone or virtual health encounter.  Pt alert and oriented, answers all questions appropriately, and able to speak in full sentences.    Assessment and Plan: Denai was seen today for dizziness.  Diagnoses and all orders for this visit:  Vertigo Reported symptoms consistent with BPPV. No red flags concerning for acute neurovascular event. Due to age will trail low dose meclizine. Pt provided sedation and medication precautions. Pt aware to report any new or worsening symptoms. Pt aware of symptoms that warrant emergent evaluation and treatment. Follow up in 1 week or sooner if needed.  -     meclizine (ANTIVERT) 12.5 MG tablet; Take 1 tablet (12.5 mg total) by mouth 3 (three) times daily as needed for dizziness.     Follow Up Instructions: Return  in about 1 week (around 10/07/2018), or if symptoms worsen or fail to improve, for vertigo.    I discussed the assessment and treatment plan with the patient. The patient was provided an opportunity to ask questions and all  were answered. The patient agreed with the plan and demonstrated an understanding of the instructions.   The patient was advised to call back or seek an in-person evaluation if the symptoms worsen or if the condition fails to improve as anticipated.  The above assessment and management plan was discussed with the patient. The patient verbalized understanding of and has agreed to the management plan. Patient is aware to call the clinic if symptoms persist or worsen. Patient is aware when to return to the clinic for a follow-up visit. Patient educated on when it is appropriate to go to the emergency department.    I provided 15 minutes of non-face-to-face time during this encounter. The call started at 1035. The call ended at 1050. The other time was used for coordination of care.    Monia Pouch, FNP-C [facility] [address] [place], [place] [address] [phone]

## 2018-10-07 ENCOUNTER — Inpatient Hospital Stay: Payer: Medicare Other

## 2018-10-07 ENCOUNTER — Other Ambulatory Visit: Payer: Self-pay

## 2018-10-07 DIAGNOSIS — Z79899 Other long term (current) drug therapy: Secondary | ICD-10-CM | POA: Diagnosis not present

## 2018-10-07 DIAGNOSIS — Z7982 Long term (current) use of aspirin: Secondary | ICD-10-CM | POA: Diagnosis not present

## 2018-10-07 DIAGNOSIS — Z7984 Long term (current) use of oral hypoglycemic drugs: Secondary | ICD-10-CM | POA: Diagnosis not present

## 2018-10-07 DIAGNOSIS — E1165 Type 2 diabetes mellitus with hyperglycemia: Secondary | ICD-10-CM | POA: Diagnosis not present

## 2018-10-07 MED ORDER — SODIUM CHLORIDE 0.9 % IV SOLN
Freq: Once | INTRAVENOUS | Status: AC
  Administered 2018-10-07: 11:00:00 via INTRAVENOUS
  Filled 2018-10-07: qty 250

## 2018-10-07 NOTE — Patient Instructions (Signed)
Therapeutic Phlebotomy Therapeutic phlebotomy is the planned removal of blood from a person's body for the purpose of treating a medical condition. The procedure is similar to donating blood. Usually, about a pint (470 mL, or 0.47 L) of blood is removed. The average adult has 9-12 pints (4.3-5.7 L) of blood in the body. Therapeutic phlebotomy may be used to treat the following medical conditions:  Hemochromatosis. This is a condition in which the blood contains too much iron.  Polycythemia vera. This is a condition in which the blood contains too many red blood cells.  Porphyria cutanea tarda. This is a disease in which an important part of hemoglobin is not made properly. It results in the buildup of abnormal amounts of porphyrins in the body.  Sickle cell disease. This is a condition in which the red blood cells form an abnormal crescent shape rather than a round shape. Tell a health care provider about:  Any allergies you have.  All medicines you are taking, including vitamins, herbs, eye drops, creams, and over-the-counter medicines.  Any problems you or family members have had with anesthetic medicines.  Any blood disorders you have.  Any surgeries you have had.  Any medical conditions you have.  Whether you are pregnant or may be pregnant. What are the risks? Generally, this is a safe procedure. However, problems may occur, including:  Nausea or light-headedness.  Low blood pressure (hypotension).  Soreness, bleeding, swelling, or bruising at the needle insertion site.  Infection. What happens before the procedure?  Follow instructions from your health care provider about eating or drinking restrictions.  Ask your health care provider about: ? Changing or stopping your regular medicines. This is especially important if you are taking diabetes medicines or blood thinners (anticoagulants). ? Taking medicines such as aspirin and ibuprofen. These medicines can thin your  blood. Do not take these medicines unless your health care provider tells you to take them. ? Taking over-the-counter medicines, vitamins, herbs, and supplements.  Wear clothing with sleeves that can be raised above the elbow.  Plan to have someone take you home from the hospital or clinic.  You may have a blood sample taken.  Your blood pressure, pulse rate, and breathing rate will be measured. What happens during the procedure?   To lower your risk of infection: ? Your health care team will wash or sanitize their hands. ? Your skin will be cleaned with an antiseptic.  You may be given a medicine to numb the area (local anesthetic).  A tourniquet will be placed on your arm.  A needle will be inserted into one of your veins.  Tubing and a collection bag will be attached to that needle.  Blood will flow through the needle and tubing into the collection bag.  The collection bag will be placed lower than your arm to allow gravity to help the flow of blood into the bag.  You may be asked to open and close your hand slowly and continually during the entire collection.  After the specified amount of blood has been removed from your body, the collection bag and tubing will be clamped.  The needle will be removed from your vein.  Pressure will be held on the site of the needle insertion to stop the bleeding.  A bandage (dressing) will be placed over the needle insertion site. The procedure may vary among health care providers and hospitals. What happens after the procedure?  Your blood pressure, pulse rate, and breathing rate will be   measured after the procedure.  You will be encouraged to drink fluids.  Your recovery will be assessed and monitored.  You can return to your normal activities as told by your health care provider. Summary  Therapeutic phlebotomy is the planned removal of blood from a person's body for the purpose of treating a medical condition.  Therapeutic  phlebotomy may be used to treat hemochromatosis, polycythemia vera, porphyria cutanea tarda, or sickle cell disease.  In the procedure, a needle is inserted and about a pint (470 mL, or 0.47 L) of blood is removed. The average adult has 9-12 pints (4.3-5.7 L) of blood in the body.  This is generally a safe procedure, but it can sometimes cause problems such as nausea, light-headedness, or low blood pressure (hypotension). This information is not intended to replace advice given to you by your health care provider. Make sure you discuss any questions you have with your health care provider. Document Released: 08/11/2010 Document Revised: 03/25/2017 Document Reviewed: 03/25/2017 Elsevier Patient Education  2020 [employer]  

## 2018-10-07 NOTE — Progress Notes (Signed)
Carmen [name] presents today for phlebotomy per MD orders. Phlebotomy procedure started at 1025 and ended at 1105  445 mls removed.  Patient observed for 30 minutes after procedure without any incident. Patient tolerated procedure well. IVF started at 700 cc/hr IV needle removed intact.

## 2018-10-19 ENCOUNTER — Other Ambulatory Visit: Payer: Self-pay | Admitting: Family Medicine

## 2018-10-20 ENCOUNTER — Telehealth: Payer: Self-pay | Admitting: Family Medicine

## 2018-10-20 NOTE — Telephone Encounter (Signed)
Wants to wait on Dr. Livia Snellen

## 2018-10-20 NOTE — Telephone Encounter (Signed)
What is the name of the medication? Pregabalin 50 mg Pharmacy told patient it was denied and needs appt and she said its not time for her to be seen. Will be out Monday  Have you contacted your pharmacy to request a refill? YES  Which pharmacy would you like this sent to? [facility]   Patient notified that their request is being sent to the clinical staff for review and that they should receive a call once it is complete. If they do not receive a call within 24 hours they can check with their pharmacy or our office.

## 2018-10-21 ENCOUNTER — Other Ambulatory Visit: Payer: Self-pay

## 2018-10-24 ENCOUNTER — Other Ambulatory Visit: Payer: Self-pay | Admitting: Family Medicine

## 2018-10-24 MED ORDER — PREGABALIN 50 MG PO CAPS: 50.0000 mg | ORAL_CAPSULE | Freq: Two times a day (BID) | ORAL | 2 refills | Status: DC

## 2018-10-24 NOTE — Telephone Encounter (Signed)
I sent in the requested prescription 

## 2018-10-24 NOTE — Telephone Encounter (Signed)
Patient aware refill has been sent to pharmacy

## 2018-10-26 ENCOUNTER — Other Ambulatory Visit: Payer: Self-pay

## 2018-10-26 ENCOUNTER — Ambulatory Visit (INDEPENDENT_AMBULATORY_CARE_PROVIDER_SITE_OTHER): Payer: Medicare Other | Admitting: Internal Medicine

## 2018-10-26 ENCOUNTER — Encounter: Payer: Self-pay | Admitting: Internal Medicine

## 2018-10-26 VITALS — BP 136/68 | HR 66 | Temp 98.4°F | Ht 63.0 in | Wt 183.0 lb

## 2018-10-26 DIAGNOSIS — E1165 Type 2 diabetes mellitus with hyperglycemia: Secondary | ICD-10-CM | POA: Diagnosis not present

## 2018-10-26 LAB — POCT GLYCOSYLATED HEMOGLOBIN (HGB A1C): Hemoglobin A1C: 7.6 % — AB (ref 4.0–5.6)

## 2018-10-26 MED ORDER — GLIPIZIDE 5 MG PO TABS: 10.0000 mg | ORAL_TABLET | Freq: Every day | ORAL | 3 refills | Status: DC

## 2018-10-26 NOTE — Progress Notes (Signed)
Name: Carmen [name]  Age/ Sex: 78 y.o., female   MRN/ DOB: [id number], [birth year]     PCP: Claretta Fraise, MD   Reason for Endocrinology Evaluation: Type 2 Diabetes Mellitus  Initial Endocrine Consultative Visit: 09/14/2018    PATIENT IDENTIFIER: Carmen [name] is a 78 y.o. female with a past medical history of HTN, PVC, neuromuscular disorder, hemochromatosis and CAD (S/P CABG) . The patient has followed with Endocrinology clinic since 09/14/2018 for consultative assistance with management of her diabetes.  DIABETIC HISTORY:  Carmen [name] was diagnosed with T2DM in 2016. Has tried oral glycemic agents in the past,Januvia- swelling , Trulicity - nausea , metformin - elevated LFT's . Her hemoglobin A1c has ranged from 6.6% in 2016, peaking at 8.6% in 2020.   SUBJECTIVE:   During the last visit (09/14/2018): A1c was 8.6%. We stopped Januvia, started Glipizide and continued [place].   Today (10/26/2018): Carmen [name] is here for a 6 week follow up on diabetes management.  She checks her blood sugars 2 times daily, preprandial to breakfast. The patient has not had hypoglycemic episodes since the last clinic visit. Otherwise, the patient has not required any recent emergency interventions for hypoglycemia and has not had recent hospitalizations secondary to hyper or hypoglycemic episodes.    She stopped [place] due to vertigo   ROS: As per HPI and as detailed below: Review of Systems  Gastrointestinal: Negative for diarrhea and nausea.  Genitourinary: Negative for frequency.  Neurological: Positive for dizziness.  Endo/Heme/Allergies: Negative for polydipsia.      HOME DIABETES REGIMEN:   Glipizide 5 mg daily      GLUCOSE LOG:  Fasting < 200 mg/dL Evening > 200  Mg/dL      HISTORY:  Past Medical History:  Past Medical History:  Diagnosis Date  . Arthritis   . Asymptomatic PVCs   . Bilateral shoulder pain   . CAD (coronary artery disease)   . Cancer  ([place]) 11/2015   melanomax4  right upper arm  . Chronic renal insufficiency, stage 3 (moderate) (HCC)   . Diabetes mellitus without complication ([place])   . Diverticulosis   . Endometrial polyp   . External hemorrhoids   . Gout   . Hemochromatosis, hereditary ([place]) 11/24/2012  . History of anemia   . History of duodenal ulcer 1990  . Hyperlipidemia   . Hypertension   . Internal hemorrhoids   . Jaundice    age 46 or 70  . Myocardial infarction ([place]) 2007  . Neuromuscular disorder ([place])    peripheral neuropathy  . PMB (postmenopausal bleeding)   . PONV (postoperative nausea and vomiting)   . Prolapse of female pelvic organs    uses pessary  . Seizures ([place])    had one at Dr. Antonieta Pert office after getting blood drawn  . Tick bite 08/12/2017   had 3 bites  . Vitamin D deficiency    Past Surgical History:  Past Surgical History:  Procedure Laterality Date  . BREAST SURGERY     left breast lump--benign  . CARDIAC CATHETERIZATION  06/03/2005  . COLONOSCOPY  11/28/2001  . CORONARY ARTERY BYPASS GRAFT  2007   x2 Dr. Roxan Hockey, [place] to LAD, SVG to PDA  . DILATATION & CURETTAGE/HYSTEROSCOPY WITH MYOSURE N/A 09/28/2017   Procedure: DILATATION & CURETTAGE/HYSTEROSCOPY WITH MYOSURE;  Surgeon: Molli Posey, MD;  Location: [facility];  Service: Gynecology;  Laterality: N/A;  . LIPOMA EXCISION     back  . UPPER GI  ENDOSCOPY  01/20/1989    Social History:  reports that she has never smoked. She has never used smokeless tobacco. She reports current alcohol use. She reports that she does not use drugs. Family History:  Family History  Problem Relation Age of Onset  . Stroke Mother   . Hypertension Mother   . Neuropathy Mother   . Stroke Father   . Hypertension Father   . Diabetes Father   . Cancer Sister 52       sarcoma  . Diabetes Brother   . Arthritis Sister   . Arthritis Sister   . Lupus Sister   . Hemachromatosis Sister   . Arthritis Sister   .  Hemachromatosis Sister   . Hemachromatosis Sister   . Diverticulitis Sister   . Diabetes Brother   . Diabetes Brother   . Hemachromatosis Brother      HOME MEDICATIONS: Allergies as of 10/26/2018      Reactions   Cymbalta [duloxetine Hcl] Other (See Comments)   Profuse sweating   Januvia [sitagliptin] Swelling   Meloxicam Nausea And Vomiting   Trulicity [dulaglutide] Nausea Only   Diclofenac Diarrhea   Shellfish Allergy Other (See Comments)   Gout   Tape    Causes skin irritation   Crestor [rosuvastatin] Other (See Comments)   Muscle aches on 5 mg daily and 10 mg twice weekly   Lipitor [atorvastatin] Other (See Comments)   Muscle aches   Livalo [pitavastatin] Other (See Comments)      Sulfa Antibiotics Rash   Zetia [ezetimibe] Other (See Comments)   Muscle aches   Zocor [simvastatin] Other (See Comments)   Muscle aches       Medication List       Accurate as of October 26, 2018  1:56 PM. If you have any questions, ask your nurse or doctor.        Accu-Chek [employer] Kit Use to check Blood Sugars   allopurinol 100 MG tablet Commonly known as: ZYLOPRIM Take 1 tablet (100 mg total) by mouth 2 (two) times daily.   aspirin 81 MG tablet Take 81 mg by mouth daily.   colchicine 0.6 MG tablet TAKE (1) TABLET DAILY AS NEEDED.   doxycycline 100 MG capsule Commonly known as: Vibramycin Take 2 tablets at one time to prevent tick bite infection.   Farxiga 5 MG Tabs tablet Generic drug: dapagliflozin propanediol   fluticasone 50 MCG/ACT nasal spray Commonly known as: FLONASE SPRAY 1 SPRAY IN EACH NOSTRIL ONCE DAILY.   furosemide 20 MG tablet Commonly known as: LASIX Take 1 tablet (20 mg total) by mouth daily.   glipiZIDE 5 MG tablet Commonly known as: GLUCOTROL Take 2 tablets (10 mg total) by mouth daily before breakfast. What changed: how much to take Changed by: Dorita Sciara, MD   glucose blood test strip Commonly known as: ONE TOUCH ULTRA  TEST CHECK BLOOD SUGAR 2 TIMES A DAY   Icosapent Ethyl 1 g Caps Commonly known as: Vascepa Take 2 capsules (2 g total) by mouth 2 (two) times daily.   meclizine 12.5 MG tablet Commonly known as: ANTIVERT Take 1 tablet (12.5 mg total) by mouth 3 (three) times daily as needed for dizziness.   metoprolol tartrate 25 MG tablet Commonly known as: LOPRESSOR Take 1 tablet (25 mg total) by mouth 2 (two) times daily.   olmesartan 20 MG tablet Commonly known as: BENICAR Take 1 tablet (20 mg total) by mouth daily.   Praluent 150 MG/ML [place]  Generic drug: Alirocumab Inject 1 Dose into the skin every 14 (fourteen) days.   pregabalin 50 MG capsule Commonly known as: LYRICA Take 1 capsule (50 mg total) by mouth 2 (two) times daily.   Vitamin D (Ergocalciferol) 1.25 MG (50000 UT) Caps capsule Commonly known as: DRISDOL Take 1 capsule (50,000 Units total) by mouth 2 (two) times a week.   Voltaren 1 % Gel Generic drug: diclofenac sodium APPLY 4 GRAMS TO AFFECTED AREA(S) 4 TIMES A DAY AS NEEDED        OBJECTIVE:   Vital Signs: BP 136/68 (BP Location: Left Arm, Patient Position: Sitting, Cuff Size: Large)   Pulse 66   Temp 98.4 F (36.9 C)   Ht 5' 3"  (1.6 m)   Wt 183 lb (83 kg)   SpO2 96%   BMI 32.42 kg/m   Wt Readings from Last 3 Encounters:  10/26/18 183 lb (83 kg)  09/22/18 182 lb (82.6 kg)  09/14/18 184 lb 6.4 oz (83.6 kg)     Exam: General: Pt appears well and is in NAD  Lungs: Clear with good BS bilat with no rales, rhonchi, or wheezes  Heart: RRR with normal S1 and S2 and no gallops; no murmurs; no rub  Abdomen: Normoactive bowel sounds, soft, nontender, without masses or organomegaly palpable  Extremities: No pretibial edema. No tremor. Normal strength and motion throughout. See detailed diabetic foot exam below.  Skin: Normal texture and temperature to palpation.   Neuro: MS is good with appropriate affect, pt is alert and Ox3     DM foot exam: 09/14/2018   The skin of the feet is intact without sores or ulcerations. The pedal pulses are 2+ on right and 2+ on left. The sensation is decreased to a screening 5.07, 10 gram monofilament bilaterally         DATA REVIEWED:  Lab Results  Component Value Date   HGBA1C 7.6 (A) 10/26/2018   HGBA1C 8.6 (H) 08/10/2018   HGBA1C 8.6 (H) 05/11/2018   Lab Results  Component Value Date   MICROALBUR neg 02/28/2013   LDLCALC 100 (H) 08/10/2018   CREATININE 1.32 (H) 09/22/2018    Lab Results  Component Value Date   CHOL 191 08/10/2018   HDL 33 (L) 08/10/2018   LDLCALC 100 (H) 08/10/2018   TRIG 289 (H) 08/10/2018   CHOLHDL 5.8 (H) 08/10/2018         ASSESSMENT / PLAN / RECOMMENDATIONS:   1) Type 2 Diabetes Mellitus, poorly controlled, With neuropathic and CKD III complications - Most recent A1c of 7.6 %. Goal A1c < 7.5 %.      - Her glucose readings have trended down from the 300's and high 200's but still above goal of 180 mg/dL - She has tried [place] twice and every time she takes it, she develops dizziness, this will not be restarted again.  - Will increase glipizide as below - She has an appointment with our CDE tomorrow.     MEDICATIONS:  Increase Glipizide 5 mg to 2 tablets with breakfast   EDUCATION / INSTRUCTIONS:  BG monitoring instructions: Patient is instructed to check her blood sugars 2 times a day, fasting and supper time .  Call [facility] if: BG persistently < 70 or > 300. . I reviewed the Rule of 15 for the treatment of hypoglycemia in detail with the patient. Literature supplied.     F/U in 3 months    Signed electronically by: Mack Guise, MD  [facility]  [facility] [address] [place], [place] [address] Phone: [phone] FAX: [phone]   CC: Claretta Fraise, MD [place] [address] Phone: [phone]  Fax: [phone]  Return to Endocrinology clinic as  below: Future Appointments  Date Time Provider [place]  10/27/2018  3:30 PM Clydell Hakim, RD [place] [facility]  11/10/2018  1:30 PM [facility]NUAL WELLNESS VISIT [facility] None  11/11/2018  9:55 AM Claretta Fraise, MD [facility] None  12/23/2018 12:00 PM [facility] None  12/23/2018 12:30 PM [place], Rudell Cobb, MD [facility] None

## 2018-10-26 NOTE — Patient Instructions (Signed)
-   Increase Glipizide to 2 tablets before breakfast     - HOW TO TREAT LOW BLOOD SUGARS (Blood sugar LESS THAN 70 MG/DL)  Please follow the RULE OF 15 for the treatment of hypoglycemia treatment (when your (blood sugars are less than 70 mg/dL)    STEP 1: Take 15 grams of carbohydrates when your blood sugar is low, which includes:   3-4 GLUCOSE TABS  OR  3-4 OZ OF JUICE OR REGULAR SODA OR  ONE TUBE OF GLUCOSE GEL     STEP 2: RECHECK blood sugar in 15 MINUTES STEP 3: If your blood sugar is still low at the 15 minute recheck --> then, go back to STEP 1 and treat AGAIN with another 15 grams of carbohydrates.

## 2018-10-27 ENCOUNTER — Encounter: Payer: Self-pay | Admitting: Dietician

## 2018-10-27 ENCOUNTER — Ambulatory Visit: Payer: Medicare Other | Admitting: Dietician

## 2018-10-27 ENCOUNTER — Encounter: Payer: Self-pay | Admitting: Internal Medicine

## 2018-10-27 DIAGNOSIS — E119 Type 2 diabetes mellitus without complications: Secondary | ICD-10-CM

## 2018-10-27 NOTE — Progress Notes (Signed)
Diabetes Self-Management Education  Visit Type: First/Initial Appt. Start Time: 1610 Appt. End Time: 1520 This visit was completed via telephone due to the COVID-19 pandemic.   I spoke with Carmen [name] and verified that I was speaking with the correct person with two patient identifiers (full name and date of birth).   I discussed the limitations related to this kind of visit and the patient is willing to proceed. Patient is at home and I am in my office. 10/30/2018  Carmen [name], identified by name and date of birth, is a 78 y.o. female with a diagnosis of Diabetes: Type 2.   ASSESSMENT History includes Type 2 diabetes, CABG at age 47, HTN, HLD, gout, hemochromatosis. She states that she has never had education for diabetes. Medications include Glipizide Patient lives alone.  She lost her husband to a stroke in 03/2016.  She does her own shopping (orders online and picks up from [employer]) and cooking and will occasionally get takeout from [employer].  Veritgo at times affects self care.  She is retired from [employer].  She has 3 daughters who come and visit on the porch with masks.   Height 5\' 4"  (1.626 m), weight 183 lb (83 kg). Body mass index is 31.41 kg/m.  Diabetes Self-Management Education - 10/27/18 1632      Visit Information   Visit Type  First/Initial      Initial Visit   Diabetes Type  Type 2    Are you currently following a meal plan?  No    Are you taking your medications as prescribed?  Yes    Date Diagnosed  2016      Health Coping   How would you rate your overall health?  Good      Psychosocial Assessment   Self-care barriers  None    Self-management support  Doctor's office;Family    Other persons present  Patient    Patient Concerns  Nutrition/Meal planning;Glycemic Control    Special Needs  None    Preferred Learning Style  No preference indicated    Learning Readiness  Not Ready    How often do you need to have someone help you when  you read instructions, pamphlets, or other written materials from your doctor or pharmacy?  1 - Never    What is the last grade level you completed in school?  12th grade      Pre-Education Assessment   Patient understands the diabetes disease and treatment process.  Needs Instruction    Patient understands incorporating nutritional management into lifestyle.  Needs Instruction    Patient undertands incorporating physical activity into lifestyle.  Needs Instruction    Patient understands using medications safely.  Needs Instruction    Patient understands monitoring blood glucose, interpreting and using results  Needs Instruction    Patient understands prevention, detection, and treatment of acute complications.  Needs Instruction    Patient understands prevention, detection, and treatment of chronic complications.  Needs Instruction    Patient understands how to develop strategies to address psychosocial issues.  Needs Instruction    Patient understands how to develop strategies to promote health/change behavior.  Needs Instruction      Complications   Last HgB A1C per patient/outside source  7.6 %   10/26/18 decreased from 8.6% 04/2017   How often do you check your blood sugar?  1-2 times/day   2-3   Fasting Blood glucose range (mg/dL)  180-200   improved from 200-311  Postprandial Blood glucose range (mg/dL)  >200   211 after lunch today   Number of hypoglycemic episodes per month  0    Number of hyperglycemic episodes per week  5    Can you tell when your blood sugar is high?  Yes    What do you do if your blood sugar is high?  drinks a lot of water and an extra glipizide    Have you had a dilated eye exam in the past 12 months?  Yes    Have you had a dental exam in the past 12 months?  Yes    Are you checking your feet?  Yes    How many days per week are you checking your feet?  7      Dietary Intake   Breakfast  plain oatmeal, banana OR plain cheerios, skim milk OR egg, 2 bacon,  occasionally with toast or fruit OR [employer] toast with butter   dislikes breakfast   Snack (morning)  none    Lunch  fresh cucumber    Snack (afternoon)  occasional mixed nuts or graham crackers    Dinner  homemade BBQ, bun, homemade slaw, fresh corn on the cob    Snack (evening)  none    Beverage(s)  water, 1 cup coffee with half and half, crystal lite, OJ (when she gets leg cramps-3-4 ounces)      Exercise   Exercise Type  Light (walking / raking leaves)   walks in her yard and does house work, interupped by vertigo     Patient Education   Previous Diabetes Education  No    Disease state   Definition of diabetes, type 1 and 2, and the diagnosis of diabetes    Nutrition management   Role of diet in the treatment of diabetes and the relationship between the three main macronutrients and blood glucose level;Food label reading, portion sizes and measuring food.;Carbohydrate counting    Physical activity and exercise   Role of exercise on diabetes management, blood pressure control and cardiac health.;Helped patient identify appropriate exercises in relation to his/her diabetes, diabetes complications and other health issue.    Medications  Reviewed patients medication for diabetes, action, purpose, timing of dose and side effects.    Acute complications  Taught treatment of hypoglycemia - the 15 rule.;Discussed and identified patients' treatment of hyperglycemia.    Chronic complications  Relationship between chronic complications and blood glucose control;Retinopathy and reason for yearly dilated eye exams    Psychosocial adjustment  Worked with patient to identify barriers to care and solutions;Identified and addressed patients feelings and concerns about diabetes    Personal strategies to promote health  Lifestyle issues that need to be addressed for better diabetes care      Individualized Goals (developed by patient)   Nutrition  General guidelines for healthy choices and portions discussed     Physical Activity  Exercise 5-7 days per week;15 minutes per day    Medications  take my medication as prescribed    Monitoring   test my blood glucose as discussed    Health Coping  discuss diabetes with (comment)   MD, RD, CDE     Post-Education Assessment   Patient understands the diabetes disease and treatment process.  Demonstrates understanding / competency    Patient understands incorporating nutritional management into lifestyle.  Demonstrates understanding / competency    Patient undertands incorporating physical activity into lifestyle.  Demonstrates understanding / competency    Patient  understands using medications safely.  Demonstrates understanding / competency    Patient understands monitoring blood glucose, interpreting and using results  Demonstrates understanding / competency    Patient understands prevention, detection, and treatment of acute complications.  Demonstrates understanding / competency    Patient understands prevention, detection, and treatment of chronic complications.  Demonstrates understanding / competency    Patient understands how to develop strategies to address psychosocial issues.  Demonstrates understanding / competency    Patient understands how to develop strategies to promote health/change behavior.  Demonstrates understanding / competency      Outcomes   Expected Outcomes  Demonstrated interest in learning. Expect positive outcomes    Future DMSE  PRN    Program Status  Completed       Individualized Plan for Diabetes Self-Management Training:   Learning Objective:  Patient will have a greater understanding of diabetes self-management. Patient education plan is to attend individual and/or group sessions per assessed needs and concerns.   Plan:   Patient Instructions  Continue mindful eating choices Continue to stay active.  Consider armchair exercises when you do not feel safe to walk. Continue your medication as prescribed.  Aim for  3 Carb Choices per meal (45 grams) +/- 1 either way  Aim for 0-1 Carbs per snack if hungry (choose raw veges, protein choices, sugar free jello or sugar free popsickle if hungry) Include protein in moderation with your meals and snacks Consider reading food labels for Total Carbohydrate of foods Continue checking BG at alternate times per day        Expected Outcomes:  Demonstrated interest in learning. Expect positive outcomes  Education material provided: ADA - How to Thrive: A Guide for Your Journey with Diabetes, Food label handouts and Meal plan card  If problems or questions, patient to contact team via:  Phone and Email  Future DSME appointment: PRN

## 2018-10-30 NOTE — Patient Instructions (Addendum)
Continue mindful eating choices Continue to stay active.  Consider armchair exercises when you do not feel safe to walk. Continue your medication as prescribed.  Aim for 3 Carb Choices per meal (45 grams) +/- 1 either way  Aim for 0-1 Carbs per snack if hungry (choose raw veges, protein choices, sugar free jello or sugar free popsickle if hungry) Include protein in moderation with your meals and snacks Consider reading food labels for Total Carbohydrate of foods Continue checking BG at alternate times per day

## 2018-11-10 ENCOUNTER — Ambulatory Visit (INDEPENDENT_AMBULATORY_CARE_PROVIDER_SITE_OTHER): Payer: Medicare Other | Admitting: *Deleted

## 2018-11-10 ENCOUNTER — Other Ambulatory Visit: Payer: Self-pay | Admitting: Family Medicine

## 2018-11-10 DIAGNOSIS — Z0000 Encounter for general adult medical examination without abnormal findings: Secondary | ICD-10-CM

## 2018-11-10 NOTE — Progress Notes (Signed)
MEDICARE ANNUAL WELLNESS VISIT  11/10/2018  Telephone Visit Disclaimer This Medicare AWV was conducted by telephone due to national recommendations for restrictions regarding the COVID-19 Pandemic (e.g. social distancing).  I verified, using two identifiers, that I am speaking with [name] or their authorized healthcare agent. I discussed the limitations, risks, security, and privacy concerns of performing an evaluation and management service by telephone and the potential availability of an in-person appointment in the future. The patient expressed understanding and agreed to proceed.   Subjective:  BELITA WARSAME is a 78 y.o. female patient of Stacks, Cletus Gash, MD who had a Medicare Annual Wellness Visit today via telephone. Alvira is Retired and lives alone. she has 3 children. she reports that she is socially active and does interact with friends/family regularly. she is minimally physically active and enjoys gardening and working/cleaning around the house.  Patient Care Team: Claretta Fraise, MD as PCP - General (Family Medicine) Marin Olp Rudell Cobb, MD as Consulting Physician (Oncology) Molli Posey, MD as Consulting Physician (Obstetrics and Gynecology) Pixie Casino, MD as Consulting Physician (Cardiology)  Advanced Directives 11/10/2018 10/27/2018 09/22/2018 11/08/2017 09/28/2017 07/28/2017 05/03/2017  Does Patient Have a Medical Advance Directive? No No No No No No No  Would patient like information on creating a medical advance directive? No - Patient declined - No - Patient declined Yes (MAU/Ambulatory/Procedural Areas - Information given) Yes (MAU/Ambulatory/Procedural Areas - Information given) - -    Hospital Utilization Over the Past 12 Months: # of hospitalizations or ER visits: 0 # of surgeries: 1  Review of Systems    Patient reports that her overall health is unchanged compared to last year.  Patient Reported Readings (BP, Pulse, CBG, Weight, etc) none   Review of Systems: History obtained from chart review  All other systems negative.  Pain Assessment Pain : No/denies pain     Current Medications & Allergies (verified) Allergies as of 11/10/2018      Reactions   Cymbalta [duloxetine Hcl] Other (See Comments)   Profuse sweating   Januvia [sitagliptin] Swelling   Meloxicam Nausea And Vomiting   Trulicity [dulaglutide] Nausea Only   Diclofenac Diarrhea   Farxiga [dapagliflozin] Other (See Comments)   Dizziness   Shellfish Allergy Other (See Comments)   Gout   Tape    Causes skin irritation   Crestor [rosuvastatin] Other (See Comments)   Muscle aches on 5 mg daily and 10 mg twice weekly   Lipitor [atorvastatin] Other (See Comments)   Muscle aches   Livalo [pitavastatin] Other (See Comments)      Sulfa Antibiotics Rash   Zetia [ezetimibe] Other (See Comments)   Muscle aches   Zocor [simvastatin] Other (See Comments)   Muscle aches       Medication List       Accurate as of November 10, 2018  1:46 PM. If you have any questions, ask your nurse or doctor.        Accu-Chek [employer] Kit Use to check Blood Sugars   allopurinol 100 MG tablet Commonly known as: ZYLOPRIM Take 1 tablet (100 mg total) by mouth 2 (two) times daily.   aspirin 81 MG tablet Take 81 mg by mouth daily.   colchicine 0.6 MG tablet TAKE (1) TABLET DAILY AS NEEDED.   fluticasone 50 MCG/ACT nasal spray Commonly known as: FLONASE SPRAY 1 SPRAY IN EACH NOSTRIL ONCE DAILY.   furosemide 20 MG tablet Commonly known as: LASIX Take 1 tablet (20 mg total)  by mouth daily.   glipiZIDE 5 MG tablet Commonly known as: GLUCOTROL Take 2 tablets (10 mg total) by mouth daily before breakfast.   glucose blood test strip Commonly known as: ONE TOUCH ULTRA TEST CHECK BLOOD SUGAR 2 TIMES A DAY   Icosapent Ethyl 1 g Caps Commonly known as: Vascepa Take 2 capsules (2 g total) by mouth 2 (two) times daily.   meclizine 12.5 MG tablet Commonly known  as: ANTIVERT Take 1 tablet (12.5 mg total) by mouth 3 (three) times daily as needed for dizziness.   metoprolol tartrate 25 MG tablet Commonly known as: LOPRESSOR Take 1 tablet (25 mg total) by mouth 2 (two) times daily.   olmesartan 20 MG tablet Commonly known as: BENICAR Take 1 tablet (20 mg total) by mouth daily.   Praluent 150 MG/ML Soaj Generic drug: Alirocumab Inject 1 Dose into the skin every 14 (fourteen) days.   pregabalin 50 MG capsule Commonly known as: LYRICA Take 1 capsule (50 mg total) by mouth 2 (two) times daily.   Vitamin D (Ergocalciferol) 1.25 MG (50000 UT) Caps capsule Commonly known as: DRISDOL Take 1 capsule (50,000 Units total) by mouth 2 (two) times a week.   Voltaren 1 % Gel Generic drug: diclofenac sodium APPLY 4 GRAMS TO AFFECTED AREA(S) 4 TIMES A DAY AS NEEDED       History (reviewed): Past Medical History:  Diagnosis Date  . Arthritis   . Asymptomatic PVCs   . Bilateral shoulder pain   . CAD (coronary artery disease)   . Cancer ([place]) 11/2015   melanomax4  right upper arm  . Chronic renal insufficiency, stage 3 (moderate) (HCC)   . Diabetes mellitus without complication ([place])   . Diverticulosis   . Endometrial polyp   . External hemorrhoids   . Gout   . Hemochromatosis, hereditary ([place]) 11/24/2012  . History of anemia   . History of duodenal ulcer 1990  . Hyperlipidemia   . Hypertension   . Internal hemorrhoids   . Jaundice    age 36 or 61  . Myocardial infarction ([place]) 2007  . Neuromuscular disorder ([place])    peripheral neuropathy  . PMB (postmenopausal bleeding)   . PONV (postoperative nausea and vomiting)   . Prolapse of female pelvic organs    uses pessary  . Seizures ([place])    had one at Dr. Antonieta Pert office after getting blood drawn  . Tick bite 08/12/2017   had 3 bites  . Vitamin D deficiency    Past Surgical History:  Procedure Laterality Date  . BREAST SURGERY     left breast lump--benign  . CARDIAC CATHETERIZATION   06/03/2005  . COLONOSCOPY  11/28/2001  . CORONARY ARTERY BYPASS GRAFT  2007   x2 Dr. Roxan Hockey, [place] to LAD, SVG to PDA  . DILATATION & CURETTAGE/HYSTEROSCOPY WITH MYOSURE N/A 09/28/2017   Procedure: DILATATION & CURETTAGE/HYSTEROSCOPY WITH MYOSURE;  Surgeon: Molli Posey, MD;  Location: [facility];  Service: Gynecology;  Laterality: N/A;  . LIPOMA EXCISION     back  . UPPER GI ENDOSCOPY  01/20/1989   Family History  Problem Relation Age of Onset  . Stroke Mother   . Hypertension Mother   . Neuropathy Mother   . Stroke Father   . Hypertension Father   . Diabetes Father   . Cancer Sister 78       sarcoma  . Diabetes Brother   . Arthritis Sister   . Arthritis Sister   . Lupus Sister   .  Hemachromatosis Sister   . Arthritis Sister   . Hemachromatosis Sister   . Hemachromatosis Sister   . Diverticulitis Sister   . Diabetes Brother   . Diabetes Brother   . Hemachromatosis Brother    Social History   Socioeconomic History  . Marital status: Widowed    Spouse name: Not on file  . Number of children: 3  . Years of education: 37  . Highest education level: High school graduate  Occupational History  . Occupation: retired  Scientific laboratory technician  . Financial resource strain: Not hard at all  . Food insecurity    Worry: Never true    Inability: Never true  . Transportation needs    Medical: No    Non-medical: No  Tobacco Use  . Smoking status: Never Smoker  . Smokeless tobacco: Never Used  . Tobacco comment: never used tobacco  Substance and Sexual Activity  . Alcohol use: Yes    Alcohol/week: 0.0 standard drinks    Comment: rare wine  . Drug use: No  . Sexual activity: Not Currently    Birth control/protection: Post-menopausal  Lifestyle  . Physical activity    Days per week: 0 days    Minutes per session: 0 min  . Stress: Not at all  Relationships  . Social connections    Talks on phone: More than three times a week    Gets together: Three times  a week    Attends religious service: More than 4 times per year    Active member of club or organization: Yes    Attends meetings of clubs or organizations: More than 4 times per year    Relationship status: Widowed  Other Topics Concern  . Not on file  Social History Narrative  . Not on file    Activities of Daily Living In your present state of health, do you have any difficulty performing the following activities: 11/10/2018  Hearing? Y  Comment has bilateral hearing aids  Vision? N  Comment pt does get her diabetic eye exam yearly  Difficulty concentrating or making decisions? N  Walking or climbing stairs? Y  Comment if there is more than one flight of stairs she gets tired and SOB  Dressing or bathing? N  Doing errands, shopping? N  Preparing Food and eating ? N  Using the Toilet? N  In the past six months, have you accidently leaked urine? N  Do you have problems with loss of bowel control? N  Managing your Medications? N  Managing your Finances? N  Housekeeping or managing your Housekeeping? N  Some recent data might be hidden    Patient Education/ Literacy How often do you need to have someone help you when you read instructions, pamphlets, or other written materials from your doctor or pharmacy?: 1 - Never What is the last grade level you completed in school?: 12th grade  Exercise Current Exercise Habits: The patient does not participate in regular exercise at present(pt states she cleans around the house and is always up cleaning and picking things up), Exercise limited by: cardiac condition(s)  Diet Patient reports consuming 2 meals a day and 1 snack(s) a day Patient reports that her primary diet is: Regular Patient reports that she does have regular access to food.   Depression Screen PHQ 2/9 Scores 11/10/2018 10/27/2018 08/08/2018 05/13/2018 12/28/2017 12/20/2017 11/08/2017  PHQ - 2 Score 0 0 0 0 0 0 0  PHQ- 9 Score - - - - - - -  Fall Risk Fall Risk   11/10/2018 10/27/2018 08/08/2018 08/08/2018 05/13/2018  Falls in the past year? 0 0 0 0 0  Number falls in past yr: 0 - - - -  Injury with Fall? 0 - - - -  Follow up - - - - -     Objective:  Maelin E Skora seemed alert and oriented and she participated appropriately during our telephone visit.  Blood Pressure Weight BMI  BP Readings from Last 3 Encounters:  10/26/18 136/68  10/07/18 (!) 148/76  09/22/18 (!) 105/49   Wt Readings from Last 3 Encounters:  10/27/18 183 lb (83 kg)  10/26/18 183 lb (83 kg)  09/22/18 182 lb (82.6 kg)   BMI Readings from Last 1 Encounters:  10/27/18 31.41 kg/m    *Unable to obtain current vital signs, weight, and BMI due to telephone visit type  Hearing/Vision  . Liliyana did not seem to have difficulty with hearing/understanding during the telephone conversation . Reports that she has not had a formal eye exam by an eye care professional within the past year . Reports that she has not had a formal hearing evaluation within the past year *Unable to fully assess hearing and vision during telephone visit type  Cognitive Function: 6CIT Screen 11/10/2018  What Year? 0 points  What month? 0 points  What time? 0 points  Count back from 20 0 points  Months in reverse 0 points  Repeat phrase 0 points  Total Score 0   (Normal:0-7, Significant for Dysfunction: >8)  Normal Cognitive Function Screening: Yes   Immunization & Health Maintenance Record Immunization History  Administered Date(s) Administered  . Influenza, High Dose Seasonal PF 12/09/2012, 12/26/2013, 03/18/2018  . Influenza,inj,Quad PF,6+ Mos 01/01/2015  . Influenza,inj,quad, With Preservative 04/23/2017  . Influenza-Unspecified 12/21/2013, 01/21/2016  . Zoster 01/24/2015    Health Maintenance  Topic Date Due  . Samul Dada  06/06/1959  . PNA vac Low Risk Adult (1 of 2 - PCV13) 06/05/2005  . FOOT EXAM  09/18/2018  . MAMMOGRAM  10/13/2018  . INFLUENZA VACCINE  10/22/2018  .  OPHTHALMOLOGY EXAM  11/26/2018  . HEMOGLOBIN A1C  04/28/2019  . DEXA SCAN  Completed       Assessment  This is a routine wellness examination for [employer].  Health Maintenance: Due or Overdue Health Maintenance Due  Topic Date Due  . TETANUS/TDAP  06/06/1959  . PNA vac Low Risk Adult (1 of 2 - PCV13) 06/05/2005  . FOOT EXAM  09/18/2018  . MAMMOGRAM  10/13/2018  . INFLUENZA VACCINE  10/22/2018    [name] does not need a referral for Community Assistance: Care Management:   no Social Work:    no Prescription Assistance:  no Nutrition/Diabetes Education:  no   Plan:  Personalized Goals Goals Addressed            This Visit's Progress   . DIET - INCREASE WATER INTAKE       Try to drink 6-8 glasses of water daily.      Personalized Health Maintenance & Screening Recommendations  Pneumococcal vaccine  Td vaccine Screening mammography  Lung Cancer Screening Recommended: no (Low Dose CT Chest recommended if Age 58-80 years, 30 pack-year currently smoking OR have quit w/in past 15 years) Hepatitis C Screening recommended: no HIV Screening recommended: no  Advanced Directives: Written information was not prepared per patient's request.  Referrals & Orders No orders of the defined types were placed in this encounter.  Follow-up Plan . Follow-up with Claretta Fraise, MD as planned . Schedule your 6 month follow up Mammogram . Consider Prevnar13 and TDAP vaccines at your next visit with your PCP   I have personally reviewed and noted the following in the patient's chart:   . Medical and social history . Use of alcohol, tobacco or illicit drugs  . Current medications and supplements . Functional ability and status . Nutritional status . Physical activity . Advanced directives . List of other physicians . Hospitalizations, surgeries, and ER visits in previous 12 months . Vitals . Screenings to include cognitive, depression, and falls .  Referrals and appointments  In addition, I have reviewed and discussed with [name] certain preventive protocols, quality metrics, and best practice recommendations. A written personalized care plan for preventive services as well as general preventive health recommendations is available and can be mailed to the patient at her request.      Marylin Crosby, LPN  [id number]/[id number]

## 2018-11-10 NOTE — Patient Instructions (Signed)
Preventive Care 78 Years and Older, Female Preventive care refers to lifestyle choices and visits with your health care provider that can promote health and wellness. This includes:  A yearly physical exam. This is also called an annual well check.  Regular dental and eye exams.  Immunizations.  Screening for certain conditions.  Healthy lifestyle choices, such as diet and exercise. What can I expect for my preventive care visit? Physical exam Your health care provider will check:  Height and weight. These may be used to calculate body mass index (BMI), which is a measurement that tells if you are at a healthy weight.  Heart rate and blood pressure.  Your skin for abnormal spots. Counseling Your health care provider may ask you questions about:  Alcohol, tobacco, and drug use.  Emotional well-being.  Home and relationship well-being.  Sexual activity.  Eating habits.  History of falls.  Memory and ability to understand (cognition).  Work and work Statistician.  Pregnancy and menstrual history. What immunizations do I need?  Influenza (flu) vaccine  This is recommended every year. Tetanus, diphtheria, and pertussis (Tdap) vaccine  You may need a Td booster every 10 years. Varicella (chickenpox) vaccine  You may need this vaccine if you have not already been vaccinated. Zoster (shingles) vaccine  You may need this after age 33. Pneumococcal conjugate (PCV13) vaccine  One dose is recommended after age 33. Pneumococcal polysaccharide (PPSV23) vaccine  One dose is recommended after age 72. Measles, mumps, and rubella (MMR) vaccine  You may need at least one dose of MMR if you were born in 1957 or later. You may also need a second dose. Meningococcal conjugate (MenACWY) vaccine  You may need this if you have certain conditions. Hepatitis A vaccine  You may need this if you have certain conditions or if you travel or work in places where you may be exposed  to hepatitis A. Hepatitis B vaccine  You may need this if you have certain conditions or if you travel or work in places where you may be exposed to hepatitis B. Haemophilus influenzae type b (Hib) vaccine  You may need this if you have certain conditions. You may receive vaccines as individual doses or as more than one vaccine together in one shot (combination vaccines). Talk with your health care provider about the risks and benefits of combination vaccines. What tests do I need? Blood tests  Lipid and cholesterol levels. These may be checked every 5 years, or more frequently depending on your overall health.  Hepatitis C test.  Hepatitis B test. Screening  Lung cancer screening. You may have this screening every year starting at age 39 if you have a 30-pack-year history of smoking and currently smoke or have quit within the past 15 years.  Colorectal cancer screening. All adults should have this screening starting at age 36 and continuing until age 15. Your health care provider may recommend screening at age 23 if you are at increased risk. You will have tests every 1-10 years, depending on your results and the type of screening test.  Diabetes screening. This is done by checking your blood sugar (glucose) after you have not eaten for a while (fasting). You may have this done every 1-3 years.  Mammogram. This may be done every 1-2 years. Talk with your health care provider about how often you should have regular mammograms.  BRCA-related cancer screening. This may be done if you have a family history of breast, ovarian, tubal, or peritoneal cancers.  Other tests  Sexually transmitted disease (STD) testing.  Bone density scan. This is done to screen for osteoporosis. You may have this done starting at age 55. Follow these instructions at home: Eating and drinking  Eat a diet that includes fresh fruits and vegetables, whole grains, lean protein, and low-fat dairy products. Limit  your intake of foods with high amounts of sugar, saturated fats, and salt.  Take vitamin and mineral supplements as recommended by your health care provider.  Do not drink alcohol if your health care provider tells you not to drink.  If you drink alcohol: ? Limit how much you have to 0-1 drink a day. ? Be aware of how much alcohol is in your drink. In the [place] one drink equals one 12 oz bottle of beer (355 mL), one 5 oz glass of wine (148 mL), or one 1 oz glass of hard liquor (44 mL). Lifestyle  Take daily care of your teeth and gums.  Stay active. Exercise for at least 30 minutes on 5 or more days each week.  Do not use any products that contain nicotine or tobacco, such as cigarettes, e-cigarettes, and chewing tobacco. If you need help quitting, ask your health care provider.  If you are sexually active, practice safe sex. Use a condom or other form of protection in order to prevent STIs (sexually transmitted infections).  Talk with your health care provider about taking a low-dose aspirin or statin. What's next?  Go to your health care provider once a year for a well check visit.  Ask your health care provider how often you should have your eyes and teeth checked.  Stay up to date on all vaccines. This information is not intended to replace advice given to you by your health care provider. Make sure you discuss any questions you have with your health care provider. Document Released: 04/05/2015 Document Revised: 03/03/2018 Document Reviewed: 03/03/2018 Elsevier Patient Education  2020 [employer].

## 2018-11-11 ENCOUNTER — Ambulatory Visit (INDEPENDENT_AMBULATORY_CARE_PROVIDER_SITE_OTHER): Payer: Medicare Other | Admitting: Family Medicine

## 2018-11-11 ENCOUNTER — Encounter: Payer: Self-pay | Admitting: Family Medicine

## 2018-11-11 DIAGNOSIS — E1159 Type 2 diabetes mellitus with other circulatory complications: Secondary | ICD-10-CM

## 2018-11-11 DIAGNOSIS — E119 Type 2 diabetes mellitus without complications: Secondary | ICD-10-CM | POA: Diagnosis not present

## 2018-11-11 DIAGNOSIS — I10 Essential (primary) hypertension: Secondary | ICD-10-CM

## 2018-11-11 DIAGNOSIS — E782 Mixed hyperlipidemia: Secondary | ICD-10-CM

## 2018-11-11 NOTE — Progress Notes (Signed)
Subjective:  Patient ID: Carmen [name], female    DOB: 11-18-1940  Age: 78 y.o. MRN: [id number]  CC: No chief complaint on file.   HPI Carmen [name] presents forFollow-up of diabetes. Patient checks blood sugar at home.   160-205 fasting.Taking glipizide and stopped farxiga due to dizziness.Glucose running too high still though Seeing Dr. Kelton Pillar, endocrine. Last A1c is down to 7.8.  Patient denies symptoms such as polyuria, polydipsia, excessive hunger, nausea No significant hypoglycemic spells noted. Medications reviewed. Pt reports taking them regularly without complication/adverse reaction being reported today.  Checking feet daily.   in for follow-up of elevated cholesterol. Doing well without complaints on current medication. Denies side effects of Praluent and zetia including myalgia and arthralgia and nausea. Currently no chest pain, shortness of breath or other cardiovascular related symptoms noted.   presents for  follow-up of hypertension. Patient has no history of headache chest pain or shortness of breath or recent cough. Patient also denies symptoms of TIA such as focal numbness or weakness. Patient denies side effects from medication. States taking it regularly.    History Carmen [name] has a past medical history of Arthritis, Asymptomatic PVCs, Bilateral shoulder pain, CAD (coronary artery disease), Cancer ([place]) (11/2015), Chronic renal insufficiency, stage 3 (moderate) ([place]), Diabetes mellitus without complication ([place]), Diverticulosis, Endometrial polyp, External hemorrhoids, Gout, Hemochromatosis, hereditary ([place]) (11/24/2012), History of anemia, History of duodenal ulcer (1990), Hyperlipidemia, Hypertension, Internal hemorrhoids, Jaundice, Myocardial infarction ([place]) (2007), Neuromuscular disorder ([place]), PMB (postmenopausal bleeding), PONV (postoperative nausea and vomiting), Prolapse of female pelvic organs, Seizures ([place]), Tick bite (08/12/2017), and Vitamin D  deficiency.   She has a past surgical history that includes Lipoma excision; Breast surgery; Cardiac catheterization (06/03/2005); Colonoscopy (11/28/2001); Upper gi endoscopy (01/20/1989); Coronary artery bypass graft (2007); and Dilatation & curettage/hysteroscopy with myosure (N/A, 09/28/2017).   Her family history includes Arthritis in her sister, sister, and sister; Cancer (age of onset: 43) in her sister; Diabetes in her brother, brother, brother, and father; Diverticulitis in her sister; Hemachromatosis in her brother, sister, sister, and sister; Hypertension in her father and mother; Lupus in her sister; Neuropathy in her mother; Stroke in her father and mother.She reports that she has never smoked. She has never used smokeless tobacco. She reports current alcohol use. She reports that she does not use drugs.  Current Outpatient Medications on File Prior to Visit  Medication Sig Dispense Refill   Alirocumab (PRALUENT) 150 MG/ML SOAJ Inject 1 Dose into the skin every 14 (fourteen) days. 2 pen 11   allopurinol (ZYLOPRIM) 100 MG tablet Take 1 tablet (100 mg total) by mouth 2 (two) times daily. 180 tablet 1   aspirin 81 MG tablet Take 81 mg by mouth daily.     colchicine 0.6 MG tablet TAKE (1) TABLET DAILY AS NEEDED. 30 tablet 1   fluticasone (FLONASE) 50 MCG/ACT nasal spray SPRAY 1 SPRAY IN EACH NOSTRIL ONCE DAILY. 16 g 4   furosemide (LASIX) 20 MG tablet Take 1 tablet (20 mg total) by mouth daily. 30 tablet 11   glipiZIDE (GLUCOTROL) 5 MG tablet Take 2 tablets (10 mg total) by mouth daily before breakfast. 180 tablet 3   glucose blood (ONE TOUCH ULTRA TEST) test strip CHECK BLOOD SUGAR 2 TIMES A DAY 100 each prn   Icosapent Ethyl (VASCEPA) 1 g CAPS Take 2 capsules (2 g total) by mouth 2 (two) times daily. 120 capsule 11   Lancets Misc. (ACCU-CHEK FASTCLIX LANCET) KIT Use to check Blood Sugars 1  kit 0   meclizine (ANTIVERT) 12.5 MG tablet Take 1 tablet (12.5 mg total) by mouth 3  (three) times daily as needed for dizziness. 30 tablet 0   metoprolol tartrate (LOPRESSOR) 25 MG tablet Take 1 tablet (25 mg total) by mouth 2 (two) times daily. 180 tablet 1   olmesartan (BENICAR) 20 MG tablet Take 1 tablet (20 mg total) by mouth daily. 90 tablet 1   pregabalin (LYRICA) 50 MG capsule Take 1 capsule (50 mg total) by mouth 2 (two) times daily. 60 capsule 2   Vitamin D, Ergocalciferol, (DRISDOL) 1.25 MG (50000 UT) CAPS capsule Take 1 capsule (50,000 Units total) by mouth 2 (two) times a week. 26 capsule 3   VOLTAREN 1 % GEL APPLY 4 GRAMS TO AFFECTED AREA(S) 4 TIMES A DAY AS NEEDED 100 g 2   No current facility-administered medications on file prior to visit.     ROS Review of Systems  Constitutional: Negative.   HENT: Negative for congestion.   Eyes: Negative for visual disturbance.  Respiratory: Negative for shortness of breath.   Cardiovascular: Negative for chest pain.  Gastrointestinal: Negative for abdominal pain, constipation, diarrhea, nausea and vomiting.  Genitourinary: Negative for difficulty urinating.  Musculoskeletal: Negative for arthralgias and myalgias.  Neurological: Negative for headaches.  Psychiatric/Behavioral: Negative for sleep disturbance.    Objective:  There were no vitals taken for this visit.  BP Readings from Last 3 Encounters:  10/26/18 136/68  10/07/18 (!) 148/76  09/22/18 (!) 105/49    Wt Readings from Last 3 Encounters:  10/27/18 183 lb (83 kg)  10/26/18 183 lb (83 kg)  09/22/18 182 lb (82.6 kg)     Physical Exam  Exam deferred. Pt. [place] due to COVID 19. Phone visit performed.   Assessment & Plan:   Diagnoses and all orders for this visit:  Non-insulin treated type 2 diabetes mellitus ([place])  Mixed hyperlipidemia  Hypertension associated with diabetes ([place])   Virtual Visit via telephone Note  I discussed the limitations, risks, security and privacy concerns of performing an evaluation and management  service by telephone and the availability of in person appointments. I also discussed with the patient that there may be a patient responsible charge related to this service. The patient expressed understanding and agreed to proceed. Pt. Is at home. Dr. Livia Snellen is in his office.  Follow Up Instructions:   I discussed the assessment and treatment plan with the patient. The patient was provided an opportunity to ask questions and all were answered. The patient agreed with the plan and demonstrated an understanding of the instructions.   The patient was advised to call back or seek an in-person evaluation if the symptoms worsen or if the condition fails to improve as anticipate Total minutes including chart review and phone contact time: 22  MEds refilled for six months. DM care per Dr. Servando Snare.  I have discontinued Nakema E. Mimbs's Farxiga. I am also having her maintain her aspirin, Voltaren, Accu-Chek FastClix Lancet, glucose blood, Vitamin D (Ergocalciferol), furosemide, olmesartan, metoprolol tartrate, Icosapent Ethyl, fluticasone, colchicine, allopurinol, Praluent, meclizine, pregabalin, and glipiZIDE.  No orders of the defined types were placed in this encounter.    Follow-up: Return in about 6 months (around 05/14/2019).  Claretta Fraise, M.D.

## 2018-11-18 ENCOUNTER — Telehealth (INDEPENDENT_AMBULATORY_CARE_PROVIDER_SITE_OTHER): Payer: Medicare Other | Admitting: Internal Medicine

## 2018-11-18 ENCOUNTER — Encounter: Payer: Self-pay | Admitting: Internal Medicine

## 2018-11-18 VITALS — BP 140/83 | HR 65 | Ht 64.0 in | Wt 182.6 lb

## 2018-11-18 DIAGNOSIS — E1159 Type 2 diabetes mellitus with other circulatory complications: Secondary | ICD-10-CM | POA: Diagnosis not present

## 2018-11-18 DIAGNOSIS — Z789 Other specified health status: Secondary | ICD-10-CM

## 2018-11-18 DIAGNOSIS — E782 Mixed hyperlipidemia: Secondary | ICD-10-CM | POA: Diagnosis not present

## 2018-11-18 DIAGNOSIS — I10 Essential (primary) hypertension: Secondary | ICD-10-CM | POA: Diagnosis not present

## 2018-11-18 DIAGNOSIS — Z951 Presence of aortocoronary bypass graft: Secondary | ICD-10-CM

## 2018-11-18 DIAGNOSIS — E119 Type 2 diabetes mellitus without complications: Secondary | ICD-10-CM | POA: Diagnosis not present

## 2018-11-18 NOTE — Progress Notes (Signed)
Virtual Visit via Telephone Note   This visit type was conducted due to national recommendations for restrictions regarding the COVID-19 Pandemic (e.g. social distancing) in an effort to limit this patient's exposure and mitigate transmission in our community.  Due to her co-morbid illnesses, this patient is at least at moderate risk for complications without adequate follow up.  This format is felt to be most appropriate for this patient at this time.  The patient did not have access to video technology/had technical difficulties with video requiring transitioning to audio format only (telephone).  All issues noted in this document were discussed and addressed.  No physical exam could be performed with this format.  Please refer to the patient's chart for her  consent to telehealth for [facility].   Evaluation Performed:  Telephone visit  Date:  11/18/2018   ID:  Carmen [name], DOB [birth year], MRN [id number]  Patient Location:  [name] [place] [address]  Provider location:   [address], [place], [place] [address]  PCP:  Claretta Fraise, MD  Cardiologist:  No primary care provider on file. Electrophysiologist:  None   Chief Complaint:  Recent vertigo  History of Present Illness:    Carmen [name] is a 78 y.o. female who presents via audio/video conferencing for a telehealth visit today.  Carmen [name] returns today for follow-up.  Recently she has had some elevated blood sugars.  Hemoglobin A1c went over 8.  Her triglycerides remained elevated.  I started her on Vascepa.  This was improved with labs in May 2020 showing a total cholesterol 191, triglycerides 289 (decreased from 466), HDL 33 and LDL of 100-calculated.  She also remains on Praluent.2  The patient does not have symptoms concerning for COVID-19 infection (fever, chills, cough, or new SHORTNESS OF BREATH).    Prior CV studies:   The following studies were reviewed today:  Chart  reviewed  PMHx:  Past Medical History:  Diagnosis Date  . Arthritis   . Asymptomatic PVCs   . Bilateral shoulder pain   . CAD (coronary artery disease)   . Cancer ([place]) 11/2015   melanomax4  right upper arm  . Chronic renal insufficiency, stage 3 (moderate) ([facility])   . Diabetes mellitus without complication ([employer])   . Diverticulosis   . Endometrial polyp   . External hemorrhoids   . Gout   . Hemochromatosis, hereditary ([place]) 11/24/2012  . History of anemia   . History of duodenal ulcer 1990  . Hyperlipidemia   . Hypertension   . Internal hemorrhoids   . Jaundice    age 53 or 45  . Myocardial infarction ([place]) 2007  . Neuromuscular disorder ([place])    peripheral neuropathy  . PMB (postmenopausal bleeding)   . PONV (postoperative nausea and vomiting)   . Prolapse of female pelvic organs    uses pessary  . Seizures ([place])    had one at Dr. Antonieta Pert office after getting blood drawn  . Tick bite 08/12/2017   had 3 bites  . Vitamin D deficiency     Past Surgical History:  Procedure Laterality Date  . BREAST SURGERY     left breast lump--benign  . CARDIAC CATHETERIZATION  06/03/2005  . COLONOSCOPY  11/28/2001  . CORONARY ARTERY BYPASS GRAFT  2007   x2 Dr. Roxan Hockey, LIMA to LAD, SVG to PDA  . DILATATION & CURETTAGE/HYSTEROSCOPY WITH MYOSURE N/A 09/28/2017   Procedure: DILATATION & CURETTAGE/HYSTEROSCOPY WITH MYOSURE;  Surgeon: Molli Posey, MD;  Location: [facility];  Service: Gynecology;  Laterality: N/A;  . LIPOMA EXCISION     back  . UPPER GI ENDOSCOPY  01/20/1989    FAMHx:  Family History  Problem Relation Age of Onset  . Stroke Mother   . Hypertension Mother   . Neuropathy Mother   . Stroke Father   . Hypertension Father   . Diabetes Father   . Cancer Sister 68       sarcoma  . Diabetes Brother   . Arthritis Sister   . Arthritis Sister   . Lupus Sister   . Hemachromatosis Sister   . Arthritis Sister   . Hemachromatosis Sister   .  Hemachromatosis Sister   . Diverticulitis Sister   . Diabetes Brother   . Diabetes Brother   . Hemachromatosis Brother     SOCHx:   reports that she has never smoked. She has never used smokeless tobacco. She reports current alcohol use. She reports that she does not use drugs.  ALLERGIES:  Allergies  Allergen Reactions  . Cymbalta [Duloxetine Hcl] Other (See Comments)    Profuse sweating  . Januvia [Sitagliptin] Swelling  . Meloxicam Nausea And Vomiting  . Trulicity [Dulaglutide] Nausea Only  . Diclofenac Diarrhea  . Wilder Glade [Dapagliflozin] Other (See Comments)    Dizziness  . Shellfish Allergy Other (See Comments)    Gout  . Tape     Causes skin irritation  . Crestor [Rosuvastatin] Other (See Comments)    Muscle aches on 5 mg daily and 10 mg twice weekly  . Lipitor [Atorvastatin] Other (See Comments)    Muscle aches  . Livalo [Pitavastatin] Other (See Comments)       . Sulfa Antibiotics Rash  . Zetia [Ezetimibe] Other (See Comments)    Muscle aches  . Zocor [Simvastatin] Other (See Comments)    Muscle aches     MEDS:  Current Meds  Medication Sig  . Alirocumab (PRALUENT) 150 MG/ML SOAJ Inject 1 Dose into the skin every 14 (fourteen) days.  Marland Kitchen allopurinol (ZYLOPRIM) 100 MG tablet Take 1 tablet (100 mg total) by mouth 2 (two) times daily.  Marland Kitchen aspirin 81 MG tablet Take 81 mg by mouth daily.  . colchicine 0.6 MG tablet TAKE (1) TABLET DAILY AS NEEDED.  . fluticasone (FLONASE) 50 MCG/ACT nasal spray SPRAY 1 SPRAY IN EACH NOSTRIL ONCE DAILY.  Marland Kitchen glipiZIDE (GLUCOTROL) 5 MG tablet Take 2 tablets (10 mg total) by mouth daily before breakfast.  . glucose blood (ONE TOUCH ULTRA TEST) test strip CHECK BLOOD SUGAR 2 TIMES A DAY  . Icosapent Ethyl (VASCEPA) 1 g CAPS Take 2 capsules (2 g total) by mouth 2 (two) times daily.  . Lancets Misc. (ACCU-CHEK FASTCLIX LANCET) KIT Use to check Blood Sugars  . meclizine (ANTIVERT) 12.5 MG tablet Take 1 tablet (12.5 mg total) by mouth 3  (three) times daily as needed for dizziness.  . metoprolol tartrate (LOPRESSOR) 25 MG tablet Take 1 tablet (25 mg total) by mouth 2 (two) times daily.  Marland Kitchen olmesartan (BENICAR) 20 MG tablet Take 1 tablet (20 mg total) by mouth daily.  . pregabalin (LYRICA) 50 MG capsule Take 1 capsule (50 mg total) by mouth 2 (two) times daily.  . Vitamin D, Ergocalciferol, (DRISDOL) 1.25 MG (50000 UT) CAPS capsule Take 1 capsule (50,000 Units total) by mouth 2 (two) times a week.  . VOLTAREN 1 % GEL APPLY 4 GRAMS TO AFFECTED AREA(S) 4 TIMES A DAY AS NEEDED  ROS: Pertinent items noted in HPI and remainder of comprehensive ROS otherwise negative.  Labs/Other Tests and Data Reviewed:    Recent Labs: 05/11/2018: NT-Pro BNP 154 09/22/2018: ALT 24; BUN 23; Creatinine 1.32; Hemoglobin 15.2; Platelet Count 158; Potassium 4.1; Sodium 138   Recent Lipid Panel Lab Results  Component Value Date/Time   CHOL 191 08/10/2018 10:10 AM   TRIG 289 (H) 08/10/2018 10:10 AM   TRIG 363 (H) 06/20/2013 03:03 PM   HDL 33 (L) 08/10/2018 10:10 AM   HDL 39 (L) 06/20/2013 03:03 PM   CHOLHDL 5.8 (H) 08/10/2018 10:10 AM   LDLCALC 100 (H) 08/10/2018 10:10 AM   LDLCALC 183 (H) 06/20/2013 03:03 PM    Wt Readings from Last 3 Encounters:  11/18/18 182 lb 9.6 oz (82.8 kg)  10/27/18 183 lb (83 kg)  10/26/18 183 lb (83 kg)     Exam:    Vital Signs:  BP 140/83   Pulse 65   Ht 5' 4"  (1.626 m)   Wt 182 lb 9.6 oz (82.8 kg)   BMI 31.34 kg/m    Exam not performed due to telephone visit  ASSESSMENT & PLAN:    1. Coronary artery bypass grafting x2 vessels with LIMA to LAD and SVG to PDA in 2007 - negative myoview in 01/2016 2. Asymptomatic PVCs 3. Hypertension-controlled 4. Dyslipidemia - intolerant to all statins and zetia, markedly improved on Praluent 5. Diabetes - A1c 7.6 6. Gout   Ms. Fomby is doing well without cardiac chest pain.  Her blood sugars are now better controlled however her A1c was about a point higher a  few months ago when she had her labs drawn.  Her triglycerides have improved and I suspect are even lower now that she is better control with respect to diabetes.  She is remain on Praluent as well as Vascepa.  Would like to repeat a fasting lipid profile and direct LDL.  Continue with current medications.  COVID-19 Education: The signs and symptoms of COVID-19 were discussed with the patient and how to seek care for testing (follow up with PCP or arrange E-visit).  The importance of social distancing was discussed today.  Patient Risk:   After full review of this patients clinical status, I feel that they are at least moderate risk at this time.  Time:   Today, I have spent 25 minutes with the patient with telehealth technology discussing dyslipidemia, type 2 diabetes, coronary disease.     Medication Adjustments/Labs and Tests Ordered: Current medicines are reviewed at length with the patient today.  Concerns regarding medicines are outlined above.   Tests Ordered: Orders Placed This Encounter  Procedures  . Lipid panel  . Direct LDL    Medication Changes: No orders of the defined types were placed in this encounter.   Disposition:  in 6 month(s)  Pixie Casino, MD, [facility], [place] Director of the Advanced Lipid Disorders &  Cardiovascular Risk Reduction Clinic Diplomate of the American Board of Clinical Lipidology Attending Cardiologist  Direct Dial: [phone]  Fax: [phone]  Website:  [email or web address]  Pixie Casino, MD  11/18/2018 10:17 AM

## 2018-11-18 NOTE — Patient Instructions (Signed)
Medication Instructions:  Your physician recommends that you continue on your current medications as directed. Please refer to the Current Medication list given to you today.  If you need a refill on your cardiac medications before your next appointment, please call your pharmacy.   Lab work: Your physician recommends that you return for a FASTING lipid profile and Direct LDL at your earliest convenience. I will mail your lab slips to you. You do not need an appointment for blood work done in our office. Please bring your lab slips with you when you return for blood work.  If you have labs (blood work) drawn today and your tests are completely normal, you will receive your results only by: Marland Kitchen MyChart Message (if you have MyChart) OR . A paper copy in the mail If you have any lab test that is abnormal or we need to change your treatment, we will call you to review the results.   Follow-Up: At [facility], you and your health needs are our priority.  As part of our continuing mission to provide you with exceptional heart care, we have created designated Provider Care Teams.  These Care Teams include your primary Cardiologist (physician) and Advanced Practice Providers (APPs -  Physician Assistants and Nurse Practitioners) who all work together to provide you with the care you need, when you need it. You will need a follow up appointment in 6 months.  Please call our office 2 months in advance to schedule this appointment.  You may see Dr. Debara Pickett or one of the following Advanced Practice Providers on your designated Care Team: Almyra Deforest, [place] . Fabian Sharp, PA-C

## 2018-11-24 DIAGNOSIS — H04123 Dry eye syndrome of bilateral lacrimal glands: Secondary | ICD-10-CM | POA: Diagnosis not present

## 2018-11-24 DIAGNOSIS — H40033 Anatomical narrow angle, bilateral: Secondary | ICD-10-CM | POA: Diagnosis not present

## 2018-11-29 ENCOUNTER — Other Ambulatory Visit: Payer: Self-pay | Admitting: Family Medicine

## 2018-12-02 ENCOUNTER — Other Ambulatory Visit: Payer: Medicare Other

## 2018-12-02 ENCOUNTER — Other Ambulatory Visit: Payer: Self-pay

## 2018-12-02 DIAGNOSIS — E782 Mixed hyperlipidemia: Secondary | ICD-10-CM | POA: Diagnosis not present

## 2018-12-03 LAB — LIPID PANEL
Chol/HDL Ratio: 4.1 ratio (ref 0.0–4.4)
Cholesterol, Total: 153 mg/dL (ref 100–199)
HDL: 37 mg/dL — ABNORMAL LOW (ref >39)
LDL Chol Calc (NIH): 72 mg/dL (ref 0–99)
Triglycerides: 271 mg/dL — ABNORMAL HIGH (ref 0–149)
VLDL Cholesterol Cal: 44 mg/dL — ABNORMAL HIGH (ref 5–40)

## 2018-12-03 LAB — LDL CHOLESTEROL, DIRECT: LDL Direct: 75 mg/dL (ref 0–99)

## 2018-12-23 ENCOUNTER — Inpatient Hospital Stay: Payer: Medicare Other | Attending: Hematology & Oncology | Admitting: Hematology & Oncology

## 2018-12-23 ENCOUNTER — Other Ambulatory Visit: Payer: Self-pay

## 2018-12-23 ENCOUNTER — Inpatient Hospital Stay: Payer: Medicare Other

## 2018-12-23 ENCOUNTER — Encounter: Payer: Self-pay | Admitting: Hematology & Oncology

## 2018-12-23 DIAGNOSIS — Z7984 Long term (current) use of oral hypoglycemic drugs: Secondary | ICD-10-CM | POA: Insufficient documentation

## 2018-12-23 DIAGNOSIS — Z7982 Long term (current) use of aspirin: Secondary | ICD-10-CM | POA: Diagnosis not present

## 2018-12-23 DIAGNOSIS — Z79899 Other long term (current) drug therapy: Secondary | ICD-10-CM | POA: Diagnosis not present

## 2018-12-23 LAB — CMP (CANCER CENTER ONLY)
ALT: 23 U/L (ref 0–44)
AST: 31 U/L (ref 15–41)
Albumin: 4 g/dL (ref 3.5–5.0)
Alkaline Phosphatase: 86 U/L (ref 38–126)
Anion gap: 12 (ref 5–15)
BUN: 19 mg/dL (ref 8–23)
CO2: 23 mmol/L (ref 22–32)
Calcium: 9.7 mg/dL (ref 8.9–10.3)
Chloride: 104 mmol/L (ref 98–111)
Creatinine: 1.18 mg/dL — ABNORMAL HIGH (ref 0.44–1.00)
GFR, Est AFR Am: 51 mL/min — ABNORMAL LOW (ref >60)
GFR, Estimated: 44 mL/min — ABNORMAL LOW (ref >60)
Glucose, Bld: 218 mg/dL — ABNORMAL HIGH (ref 70–99)
Potassium: 4 mmol/L (ref 3.5–5.1)
Sodium: 139 mmol/L (ref 135–145)
Total Bilirubin: 0.3 mg/dL (ref 0.3–1.2)
Total Protein: 7.2 g/dL (ref 6.5–8.1)

## 2018-12-23 LAB — CBC WITH DIFFERENTIAL (CANCER CENTER ONLY)
Abs Immature Granulocytes: 0.06 10*3/uL (ref 0.00–0.07)
Basophils Absolute: 0.1 10*3/uL (ref 0.0–0.1)
Basophils Relative: 1 %
Eosinophils Absolute: 0.3 10*3/uL (ref 0.0–0.5)
Eosinophils Relative: 4 %
HCT: 42.4 % (ref 36.0–46.0)
Hemoglobin: 14.2 g/dL (ref 12.0–15.0)
Immature Granulocytes: 1 %
Lymphocytes Relative: 42 %
Lymphs Abs: 3.7 10*3/uL (ref 0.7–4.0)
MCH: 30.1 pg (ref 26.0–34.0)
MCHC: 33.5 g/dL (ref 30.0–36.0)
MCV: 89.8 fL (ref 80.0–100.0)
Monocytes Absolute: 0.5 10*3/uL (ref 0.1–1.0)
Monocytes Relative: 6 %
Neutro Abs: 4.2 10*3/uL (ref 1.7–7.7)
Neutrophils Relative %: 46 %
Platelet Count: 154 10*3/uL (ref 150–400)
RBC: 4.72 MIL/uL (ref 3.87–5.11)
RDW: 13.1 % (ref 11.5–15.5)
WBC Count: 8.9 10*3/uL (ref 4.0–10.5)
nRBC: 0 % (ref 0.0–0.2)

## 2018-12-23 NOTE — Progress Notes (Signed)
Hematology and Oncology Follow Up Visit  Carmen [name] [id number] 1940-10-13 78 y.o. 12/23/2018   Principle Diagnosis:  Hemochromatosis (double heterozygote for C282Y and S65C mutations).  Current Therapy:    Phlebotomy to maintain ferritin less than 100     Interim History:  Carmen [name] is back for followup.  We actually saw her 3 months ago.  Everything is going well for her.  She really has been cautious with the coronavirus.  Her ferritin back in July was 130 with an iron saturation of 41%.  We did go ahead and do a phlebotomy on her.  I think we did 2 phlebotomies.  She has had no other issues.  She has had no problems with nausea or vomiting.  Patient does tell me that about a week ago she had some substernal chest discomfort with pain in her right hand.  She has had a past history of bypass surgery.  I told her that if this happens again, that she really needs to speak to her cardiologist.  Is been 13 years since she had the bypass surgery.  It is possible that her coronary grafts might be wearing out.  She has had no fever.  She has had no diaphoresis.  Her blood sugars have been on the high side.  I think this is going be her biggest problem long-term.  Currently, her performance status is ECOG 1.     Medications:  Current Outpatient Medications:  .  Alirocumab (PRALUENT) 150 MG/ML SOAJ, Inject 1 Dose into the skin every 14 (fourteen) days., Disp: 2 pen, Rfl: 11 .  allopurinol (ZYLOPRIM) 100 MG tablet, Take 1 tablet (100 mg total) by mouth 2 (two) times daily., Disp: 180 tablet, Rfl: 1 .  aspirin 81 MG tablet, Take 81 mg by mouth daily., Disp: , Rfl:  .  colchicine 0.6 MG tablet, TAKE (1) TABLET DAILY AS NEEDED., Disp: 30 tablet, Rfl: 1 .  fluticasone (FLONASE) 50 MCG/ACT nasal spray, SPRAY 1 SPRAY IN EACH NOSTRIL ONCE DAILY., Disp: 16 g, Rfl: 4 .  furosemide (LASIX) 20 MG tablet, Take 1 tablet (20 mg total) by mouth daily., Disp: 30 tablet, Rfl: 11 .  glipiZIDE  (GLUCOTROL) 5 MG tablet, Take 2 tablets (10 mg total) by mouth daily before breakfast., Disp: 180 tablet, Rfl: 3 .  glucose blood (ONE TOUCH ULTRA TEST) test strip, CHECK BLOOD SUGAR 2 TIMES A DAY, Disp: 100 each, Rfl: prn .  Icosapent Ethyl (VASCEPA) 1 g CAPS, Take 2 capsules (2 g total) by mouth 2 (two) times daily., Disp: 120 capsule, Rfl: 11 .  Lancets Misc. (ACCU-CHEK FASTCLIX LANCET) KIT, Use to check Blood Sugars, Disp: 1 kit, Rfl: 0 .  meclizine (ANTIVERT) 12.5 MG tablet, Take 1 tablet (12.5 mg total) by mouth 3 (three) times daily as needed for dizziness., Disp: 30 tablet, Rfl: 0 .  metoprolol tartrate (LOPRESSOR) 25 MG tablet, Take 1 tablet (25 mg total) by mouth 2 (two) times daily., Disp: 180 tablet, Rfl: 1 .  olmesartan (BENICAR) 20 MG tablet, Take 1 tablet (20 mg total) by mouth daily., Disp: 90 tablet, Rfl: 1 .  pregabalin (LYRICA) 50 MG capsule, Take 1 capsule (50 mg total) by mouth 2 (two) times daily., Disp: 60 capsule, Rfl: 2 .  RESTASIS 0.05 % ophthalmic emulsion, , Disp: , Rfl:  .  Vitamin D, Ergocalciferol, (DRISDOL) 1.25 MG (50000 UT) CAPS capsule, Take 1 capsule (50,000 Units total) by mouth 2 (two) times a week., Disp: 26 capsule,  Rfl: 3 .  VOLTAREN 1 % GEL, APPLY 4 GRAMS TO AFFECTED AREA(S) 4 TIMES A DAY AS NEEDED, Disp: 100 g, Rfl: 0  Allergies:  Allergies  Allergen Reactions  . Cymbalta [Duloxetine Hcl] Other (See Comments)    Profuse sweating  . Januvia [Sitagliptin] Swelling  . Meloxicam Nausea And Vomiting  . Trulicity [Dulaglutide] Nausea Only  . Diclofenac Diarrhea  . Wilder Glade [Dapagliflozin] Other (See Comments)    Dizziness  . Shellfish Allergy Other (See Comments)    Gout  . Tape     Causes skin irritation  . Crestor [Rosuvastatin] Other (See Comments)    Muscle aches on 5 mg daily and 10 mg twice weekly  . Lipitor [Atorvastatin] Other (See Comments)    Muscle aches  . Livalo [Pitavastatin] Other (See Comments)       . Sulfa Antibiotics Rash  .  Zetia [Ezetimibe] Other (See Comments)    Muscle aches  . Zocor [Simvastatin] Other (See Comments)    Muscle aches     Past Medical History, Surgical history, Social history, and Family History were reviewed and updated.  Review of Systems: Review of Systems  Constitutional: Negative.   HENT: Negative.   Eyes: Negative.   Respiratory: Negative.   Cardiovascular: Negative.   Gastrointestinal: Negative.   Genitourinary: Negative.   Musculoskeletal: Negative.   Skin: Negative.   Neurological: Negative.   Endo/Heme/Allergies: Negative.   Psychiatric/Behavioral: Negative.     Physical Exam:  weight is 196 lb (88.9 kg). Her temporal temperature is 97.3 F (36.3 C) (abnormal). Her blood pressure is 149/64 (abnormal) and her pulse is 87. Her respiration is 19 and oxygen saturation is 96%.   Physical Exam Vitals signs reviewed.  HENT:     Head: Normocephalic and atraumatic.  Eyes:     Pupils: Pupils are equal, round, and reactive to light.  Neck:     Musculoskeletal: Normal range of motion.  Cardiovascular:     Rate and Rhythm: Normal rate and regular rhythm.     Heart sounds: Normal heart sounds.  Pulmonary:     Effort: Pulmonary effort is normal.     Breath sounds: Normal breath sounds.  Abdominal:     General: Bowel sounds are normal.     Palpations: Abdomen is soft.  Musculoskeletal: Normal range of motion.        General: No tenderness or deformity.  Lymphadenopathy:     Cervical: No cervical adenopathy.  Skin:    General: Skin is warm and dry.     Findings: No erythema or rash.  Neurological:     Mental Status: She is alert and oriented to person, place, and time.  Psychiatric:        Behavior: Behavior normal.        Thought Content: Thought content normal.        Judgment: Judgment normal.      Lab Results  Component Value Date   WBC 8.9 12/23/2018   HGB 14.2 12/23/2018   HCT 42.4 12/23/2018   MCV 89.8 12/23/2018   PLT 154 12/23/2018     Chemistry       Component Value Date/Time   NA 139 12/23/2018 1156   NA 140 08/10/2018 1010   NA 147 (H) 01/22/2017 1451   NA 140 12/31/2015 1052   K 4.0 12/23/2018 1156   K 4.9 (H) 01/22/2017 1451   K 4.2 12/31/2015 1052   CL 104 12/23/2018 1156   CL 106 01/22/2017 1451  CO2 23 12/23/2018 1156   CO2 29 01/22/2017 1451   CO2 23 12/31/2015 1052   BUN 19 12/23/2018 1156   BUN 17 08/10/2018 1010   BUN 23 (H) 01/22/2017 1451   BUN 21.4 12/31/2015 1052   CREATININE 1.18 (H) 12/23/2018 1156   CREATININE 1.6 (H) 01/22/2017 1451   CREATININE 1.4 (H) 12/31/2015 1052      Component Value Date/Time   CALCIUM 9.7 12/23/2018 1156   CALCIUM 10.3 01/22/2017 1451   CALCIUM 9.7 12/31/2015 1052   ALKPHOS 86 12/23/2018 1156   ALKPHOS 65 01/22/2017 1451   ALKPHOS 87 12/31/2015 1052   AST 31 12/23/2018 1156   AST 31 12/31/2015 1052   ALT 23 12/23/2018 1156   ALT 25 01/22/2017 1451   ALT 36 12/31/2015 1052   BILITOT 0.3 12/23/2018 1156   BILITOT 0.42 12/31/2015 1052        Impression and Plan: Ms. Harnden is 78 year old white female with hemachromatosis. She is a double heterozygote.  I have to see what her iron studies look like.  I will get them back over the weekend.  We will go ahead and plan to get her back in about 4 months now.  I think that we will do okay with four-month follow-up.  I just am happy that she is doing well overall.  I just wish that her blood sugars were better.    Marland Kitchen Volanda Napoleon, MD 10/2/20201:18 PM

## 2018-12-26 ENCOUNTER — Telehealth: Payer: Self-pay | Admitting: *Deleted

## 2018-12-26 LAB — IRON AND TIBC
Iron: 74 ug/dL (ref 41–142)
Saturation Ratios: 26 % (ref 21–57)
TIBC: 288 ug/dL (ref 236–444)
UIBC: 214 ug/dL (ref 120–384)

## 2018-12-26 LAB — FERRITIN: Ferritin: 41 ng/mL (ref 11–307)

## 2018-12-26 NOTE — Telephone Encounter (Signed)
-----   Message from Volanda Napoleon, MD sent at 12/26/2018  9:53 AM EDT ----- Call - the iron level is ok!!  No need for a phlebotomy.  Laurey Arrow

## 2018-12-26 NOTE — Telephone Encounter (Signed)
Message left on pt.'s private line to inform her per order of Dr. Marin Olp that "the iron level is ok!  No need for a phlebotomy."  Instructed pt to call office back with any questions or concerns.

## 2019-01-22 ENCOUNTER — Other Ambulatory Visit: Payer: Self-pay | Admitting: Family Medicine

## 2019-01-26 ENCOUNTER — Encounter: Payer: Self-pay | Admitting: Internal Medicine

## 2019-01-26 ENCOUNTER — Ambulatory Visit (INDEPENDENT_AMBULATORY_CARE_PROVIDER_SITE_OTHER): Payer: Medicare Other | Admitting: Internal Medicine

## 2019-01-26 ENCOUNTER — Other Ambulatory Visit: Payer: Self-pay

## 2019-01-26 VITALS — BP 134/82 | HR 60 | Temp 98.2°F | Ht 64.0 in | Wt 185.6 lb

## 2019-01-26 DIAGNOSIS — N1832 Chronic kidney disease, stage 3b: Secondary | ICD-10-CM | POA: Diagnosis not present

## 2019-01-26 DIAGNOSIS — E1165 Type 2 diabetes mellitus with hyperglycemia: Secondary | ICD-10-CM

## 2019-01-26 DIAGNOSIS — E1121 Type 2 diabetes mellitus with diabetic nephropathy: Secondary | ICD-10-CM

## 2019-01-26 DIAGNOSIS — E119 Type 2 diabetes mellitus without complications: Secondary | ICD-10-CM

## 2019-01-26 DIAGNOSIS — E1122 Type 2 diabetes mellitus with diabetic chronic kidney disease: Secondary | ICD-10-CM

## 2019-01-26 LAB — MICROALBUMIN / CREATININE URINE RATIO
Creatinine,U: 18.6 mg/dL
Microalb Creat Ratio: 3.8 mg/g (ref 0.0–30.0)
Microalb, Ur: <0.7 mg/dL (ref 0.0–1.9)

## 2019-01-26 LAB — POCT GLYCOSYLATED HEMOGLOBIN (HGB A1C): Hemoglobin A1C: 8.2 % — AB (ref 4.0–5.6)

## 2019-01-26 MED ORDER — GLIPIZIDE 10 MG PO TABS: ORAL_TABLET | ORAL | 4 refills | Status: DC

## 2019-01-26 NOTE — Progress Notes (Signed)
Name: Carmen [name]  Age/ Sex: 78 y.o., female   MRN/ DOB: [id number], 01-Feb-1941     PCP: Claretta Fraise, MD   Reason for Endocrinology Evaluation: Type 2 Diabetes Mellitus  Initial Endocrine Consultative Visit: 09/14/2018    PATIENT IDENTIFIER: Carmen [name] is a 77 y.o. female with a past medical history of HTN, PVC, neuromuscular disorder, hemochromatosis and CAD (S/P CABG) . The patient has followed with Endocrinology clinic since 09/14/2018 for consultative assistance with management of her diabetes.  DIABETIC HISTORY:  Carmen [name] was diagnosed with T2DM in 2016. Has tried oral glycemic agents in the past,Januvia- swelling , Trulicity - nausea , metformin - elevated LFT's . Her hemoglobin A1c has ranged from 6.6% in 2016, peaking at 8.6% in 2020.  Wilder Glade stopped 10/2018 due to vertigo   SUBJECTIVE:   During the last visit (10/26/2018): A1c was 7.6%. We increased  Glipizide    Today (01/27/2019): Carmen [name] is here for a 3 month follow up on diabetes management.  She checks her blood sugars 2 times daily, preprandial to breakfast. The patient has not had hypoglycemic episodes since the last clinic visit. Otherwise, the patient has not required any recent emergency interventions for hypoglycemia and has not had recent hospitalizations secondary to hyper or hypoglycemic episodes.   Broke her front teeth while eating a piece of bacon and has been    ROS: As per HPI and as detailed below: Review of Systems  Constitutional: Negative for chills and fever.  Respiratory: Negative for cough and shortness of breath.   Cardiovascular: Negative for chest pain and palpitations.  Gastrointestinal: Negative for diarrhea and nausea.  Genitourinary: Negative for frequency.  Neurological: Positive for dizziness.  Endo/Heme/Allergies: Negative for polydipsia.      HOME DIABETES REGIMEN:   Glipizide 5 mg 2 tabs daily      GLUCOSE LOG:   > 200  Mg/dL  Fasting and  night      HISTORY:  Past Medical History:  Past Medical History:  Diagnosis Date  . Arthritis   . Asymptomatic PVCs   . Bilateral shoulder pain   . CAD (coronary artery disease)   . Cancer ([place]) 11/2015   melanomax4  right upper arm  . Chronic renal insufficiency, stage 3 (moderate)   . Diabetes mellitus without complication ([place])   . Diverticulosis   . Endometrial polyp   . External hemorrhoids   . Gout   . Hemochromatosis, hereditary ([place]) 11/24/2012  . History of anemia   . History of duodenal ulcer 1990  . Hyperlipidemia   . Hypertension   . Internal hemorrhoids   . Jaundice    age 43 or 71  . Myocardial infarction ([place]) 2007  . Neuromuscular disorder ([place])    peripheral neuropathy  . PMB (postmenopausal bleeding)   . PONV (postoperative nausea and vomiting)   . Prolapse of female pelvic organs    uses pessary  . Seizures ([place])    had one at Dr. Antonieta Pert office after getting blood drawn  . Tick bite 08/12/2017   had 3 bites  . Vitamin D deficiency    Past Surgical History:  Past Surgical History:  Procedure Laterality Date  . BREAST SURGERY     left breast lump--benign  . CARDIAC CATHETERIZATION  06/03/2005  . COLONOSCOPY  11/28/2001  . CORONARY ARTERY BYPASS GRAFT  2007   x2 Dr. Roxan Hockey, LIMA to LAD, SVG to PDA  . DILATATION & CURETTAGE/HYSTEROSCOPY WITH MYOSURE N/A 09/28/2017  Procedure: [place];  Surgeon: Molli Posey, MD;  Location: [facility];  Service: Gynecology;  Laterality: N/A;  . LIPOMA EXCISION     back  . UPPER GI ENDOSCOPY  01/20/1989    Social History:  reports that she has never smoked. She has never used smokeless tobacco. She reports current alcohol use. She reports that she does not use drugs. Family History:  Family History  Problem Relation Age of Onset  . Stroke Mother   . Hypertension Mother   . Neuropathy Mother   . Stroke Father   . Hypertension Father   .  Diabetes Father   . Cancer Sister 80       sarcoma  . Diabetes Brother   . Arthritis Sister   . Arthritis Sister   . Lupus Sister   . Hemachromatosis Sister   . Arthritis Sister   . Hemachromatosis Sister   . Hemachromatosis Sister   . Diverticulitis Sister   . Diabetes Brother   . Diabetes Brother   . Hemachromatosis Brother      HOME MEDICATIONS: Allergies as of 01/26/2019      Reactions   Cymbalta [duloxetine Hcl] Other (See Comments)   Profuse sweating   Januvia [sitagliptin] Swelling   Meloxicam Nausea And Vomiting   Trulicity [dulaglutide] Nausea Only   Diclofenac Diarrhea   Farxiga [dapagliflozin] Other (See Comments)   Dizziness   Shellfish Allergy Other (See Comments)   Gout   Tape    Causes skin irritation   Crestor [rosuvastatin] Other (See Comments)   Muscle aches on 5 mg daily and 10 mg twice weekly   Lipitor [atorvastatin] Other (See Comments)   Muscle aches   Livalo [pitavastatin] Other (See Comments)      Sulfa Antibiotics Rash   Zetia [ezetimibe] Other (See Comments)   Muscle aches   Zocor [simvastatin] Other (See Comments)   Muscle aches       Medication List       Accurate as of January 26, 2019 11:59 PM. If you have any questions, ask your nurse or doctor.        Accu-Chek [employer] Kit Use to check Blood Sugars   allopurinol 100 MG tablet Commonly known as: ZYLOPRIM Take 1 tablet (100 mg total) by mouth 2 (two) times daily.   aspirin 81 MG tablet Take 81 mg by mouth daily.   colchicine 0.6 MG tablet TAKE (1) TABLET DAILY AS NEEDED.   fluticasone 50 MCG/ACT nasal spray Commonly known as: FLONASE SPRAY 1 SPRAY IN EACH NOSTRIL ONCE DAILY.   furosemide 20 MG tablet Commonly known as: LASIX Take 1 tablet (20 mg total) by mouth daily.   glipiZIDE 10 MG tablet Commonly known as: GLUCOTROL Take 2 tablets (20 mg total) by mouth daily before breakfast AND 1 tablet (10 mg total) daily before supper. What changed:    medication strength  See the new instructions. Changed by: Dorita Sciara, MD   glucose blood test strip Commonly known as: ONE TOUCH ULTRA TEST CHECK BLOOD SUGAR 2 TIMES A DAY   Icosapent Ethyl 1 g Caps Commonly known as: Vascepa Take 2 capsules (2 g total) by mouth 2 (two) times daily.   meclizine 12.5 MG tablet Commonly known as: ANTIVERT Take 1 tablet (12.5 mg total) by mouth 3 (three) times daily as needed for dizziness.   metoprolol tartrate 25 MG tablet Commonly known as: LOPRESSOR Take 1 tablet (25 mg total) by mouth 2 (two)  times daily.   olmesartan 20 MG tablet Commonly known as: BENICAR Take 1 tablet (20 mg total) by mouth daily.   Praluent 150 MG/ML Soaj Generic drug: Alirocumab Inject 1 Dose into the skin every 14 (fourteen) days.   pregabalin 50 MG capsule Commonly known as: LYRICA TAKE  (1)  CAPSULE  TWICE DAILY.   Restasis 0.05 % ophthalmic emulsion Generic drug: cycloSPORINE   Vitamin D (Ergocalciferol) 1.25 MG (50000 UT) Caps capsule Commonly known as: DRISDOL Take 1 capsule (50,000 Units total) by mouth 2 (two) times a week.   Voltaren 1 % Gel Generic drug: diclofenac sodium APPLY 4 GRAMS TO AFFECTED AREA(S) 4 TIMES A DAY AS NEEDED        OBJECTIVE:   Vital Signs: BP 134/82 (BP Location: Left Arm, Patient Position: Sitting, Cuff Size: Large)   Pulse 60   Temp 98.2 F (36.8 C) (Oral)   Ht 5' 4"  (1.626 m)   Wt 185 lb 9.6 oz (84.2 kg)   SpO2 96%   BMI 31.86 kg/m   Wt Readings from Last 3 Encounters:  01/26/19 185 lb 9.6 oz (84.2 kg)  12/23/18 196 lb (88.9 kg)  11/18/18 182 lb 9.6 oz (82.8 kg)     Exam: General: Pt appears well and is in NAD  Lungs: Clear with good BS bilat with no rales, rhonchi, or wheezes  Heart: RRR with normal S1 and S2 and no gallops; no murmurs; no rub  Abdomen: Normoactive bowel sounds, soft, nontender, without masses or organomegaly palpable  Extremities: No pretibial edema. No tremor. Normal  strength and motion throughout. See detailed diabetic foot exam below.  Skin: Normal texture and temperature to palpation.   Neuro: MS is good with appropriate affect, pt is alert and Ox3     DM foot exam: 01/26/2019  The skin of the feet is intact without sores or ulcerations. The pedal pulses are 2+ on right and 2+ on left. The sensation is decreased to a screening 5.07, 10 gram monofilament bilaterally         DATA REVIEWED:  Lab Results  Component Value Date   HGBA1C 8.2 (A) 01/26/2019   HGBA1C 7.6 (A) 10/26/2018   HGBA1C 8.6 (H) 08/10/2018   Lab Results  Component Value Date   MICROALBUR <0.7 01/26/2019   LDLCALC 72 12/02/2018   CREATININE 1.18 (H) 12/23/2018    Lab Results  Component Value Date   CHOL 153 12/02/2018   HDL 37 (L) 12/02/2018   LDLCALC 72 12/02/2018   LDLDIRECT 75 12/02/2018   TRIG 271 (H) 12/02/2018   CHOLHDL 4.1 12/02/2018         ASSESSMENT / PLAN / RECOMMENDATIONS:   1) Type 2 Diabetes Mellitus, Sub-Optimally  controlled, With neuropathic and CKD III complications - Most recent A1c of 8.2 %. Goal A1c < 7.5 %.     - Unfortunately she continues with hyperglycemia, we discussed that insulin would be safest for her given her low GFR , pt would like to avoid this at this time and would rather increase Glipizide first .   MEDICATIONS: - Stop Glipizide 5 mg  - Glipizide 10 mg , 2 tabs with Breakfast and 1 tablet with Supper      EDUCATION / INSTRUCTIONS:  BG monitoring instructions: Patient is instructed to check her blood sugars 2 times a day, fasting and supper time .  Call [facility] if: BG persistently < 70 or > 300. . I reviewed the Rule of 15 for  the treatment of hypoglycemia in detail with the patient. Literature supplied.     F/U in 3 months    Signed electronically by: Mack Guise, MD  [facility]  [address] [place], [place] [address]  Phone: [phone] FAX: [phone]   CC: Claretta Fraise, MD [place] [address] Phone: [phone]  Fax: [phone]  Return to Endocrinology clinic as below: Future Appointments  Date Time Provider [place]  03/29/2019  2:00 PM Candelaria Pies, Melanie Crazier, MD [facility] None  04/25/2019 11:30 AM [facility] None  04/25/2019 12:00 PM En[place], Rudell Cobb, MD [facility] None

## 2019-01-26 NOTE — Patient Instructions (Signed)
-   STOP the 5 mg Glipizide tablets   - Start Glipizide 10 mg, 2 tablets before breakfast and 1 tablet before supper      - HOW TO TREAT LOW BLOOD SUGARS (Blood sugar LESS THAN 70 MG/DL)  Please follow the RULE OF 15 for the treatment of hypoglycemia treatment (when your (blood sugars are less than 70 mg/dL)    STEP 1: Take 15 grams of carbohydrates when your blood sugar is low, which includes:   3-4 GLUCOSE TABS  OR  3-4 OZ OF JUICE OR REGULAR SODA OR  ONE TUBE OF GLUCOSE GEL     STEP 2: RECHECK blood sugar in 15 MINUTES STEP 3: If your blood sugar is still low at the 15 minute recheck --> then, go back to STEP 1 and treat AGAIN with another 15 grams of carbohydrates.

## 2019-01-27 ENCOUNTER — Ambulatory Visit: Payer: Medicare Other | Admitting: Internal Medicine

## 2019-01-27 DIAGNOSIS — E1165 Type 2 diabetes mellitus with hyperglycemia: Secondary | ICD-10-CM | POA: Insufficient documentation

## 2019-01-27 DIAGNOSIS — N1832 Type 2 diabetes mellitus with diabetic chronic kidney disease: Secondary | ICD-10-CM | POA: Insufficient documentation

## 2019-01-27 DIAGNOSIS — E1122 Type 2 diabetes mellitus with diabetic chronic kidney disease: Secondary | ICD-10-CM | POA: Insufficient documentation

## 2019-01-27 HISTORY — DX: Type 2 diabetes mellitus with hyperglycemia: E11.65

## 2019-02-07 ENCOUNTER — Ambulatory Visit: Payer: Medicare Other

## 2019-02-20 ENCOUNTER — Other Ambulatory Visit: Payer: Self-pay | Admitting: Family Medicine

## 2019-03-09 ENCOUNTER — Other Ambulatory Visit: Payer: Self-pay | Admitting: Family Medicine

## 2019-03-13 ENCOUNTER — Other Ambulatory Visit: Payer: Self-pay

## 2019-03-20 ENCOUNTER — Telehealth: Payer: Self-pay | Admitting: Family Medicine

## 2019-03-20 NOTE — Telephone Encounter (Signed)
Zinc Vit C Vit B complex Vit D

## 2019-03-20 NOTE — Telephone Encounter (Signed)
Pt aware of provider feedback and voiced understanding. 

## 2019-03-20 NOTE — Telephone Encounter (Signed)
Will not prevent but will help build up immune system.

## 2019-03-22 ENCOUNTER — Other Ambulatory Visit: Payer: Self-pay | Admitting: Family Medicine

## 2019-03-29 ENCOUNTER — Ambulatory Visit (INDEPENDENT_AMBULATORY_CARE_PROVIDER_SITE_OTHER): Payer: Medicare Other | Admitting: Family Medicine

## 2019-03-29 ENCOUNTER — Ambulatory Visit: Payer: Medicare Other | Admitting: Internal Medicine

## 2019-03-29 ENCOUNTER — Encounter: Payer: Self-pay | Admitting: Family Medicine

## 2019-03-29 DIAGNOSIS — E1159 Type 2 diabetes mellitus with other circulatory complications: Secondary | ICD-10-CM

## 2019-03-29 DIAGNOSIS — E1121 Type 2 diabetes mellitus with diabetic nephropathy: Secondary | ICD-10-CM | POA: Diagnosis not present

## 2019-03-29 DIAGNOSIS — N1832 Chronic kidney disease, stage 3b: Secondary | ICD-10-CM | POA: Diagnosis not present

## 2019-03-29 DIAGNOSIS — E1122 Type 2 diabetes mellitus with diabetic chronic kidney disease: Secondary | ICD-10-CM

## 2019-03-29 DIAGNOSIS — Z20822 Contact with and (suspected) exposure to covid-19: Secondary | ICD-10-CM | POA: Diagnosis not present

## 2019-03-29 DIAGNOSIS — I10 Essential (primary) hypertension: Secondary | ICD-10-CM

## 2019-03-29 NOTE — Progress Notes (Signed)
Subjective:    Patient ID: Carmen [name], female    DOB: 20-Sep-1940, 79 y.o.   MRN: [id number]   HPI: Carmen [name] is a 79 y.o. female presenting for recheck and is now quarantining due to CoVID exposure 8-10 days ago. Son in [place] tested positive after pt. Was around him on Christmas for dinner. Was 12 days ago and has no symptoms.   Pt. Requests that she get vaccine ASAP. Had flu shot [facility] November.   presents forFollow-up of diabetes. Patient checks blood sugar at home.   150 fasting and 170-190 postprandial. Working with dietician and with endocrine doctor. Patient denies symptoms such as polyuria, polydipsia, excessive hunger, nausea No significant hypoglycemic spells noted.Treatment  For shoulder with steroid shot ran it up.    Depression screen [facility] 2/9 11/10/2018 10/27/2018 08/08/2018 05/13/2018 12/28/2017  Decreased Interest 0 0 0 0 0  Down, Depressed, Hopeless 0 0 0 0 0  PHQ - 2 Score 0 0 0 0 0  Altered sleeping - - - - -  Tired, decreased energy - - - - -  Change in appetite - - - - -  Feeling bad or failure about yourself  - - - - -  Trouble concentrating - - - - -  Moving slowly or fidgety/restless - - - - -  Suicidal thoughts - - - - -  PHQ-9 Score - - - - -  Difficult doing work/chores - - - - -  Some recent data might be hidden     Relevant past medical, surgical, family and social history reviewed and updated as indicated.  Interim medical history since our last visit reviewed. Allergies and medications reviewed and updated.  ROS:  Review of Systems  Constitutional: Negative.   HENT: Negative for congestion.   Eyes: Negative for visual disturbance.  Respiratory: Negative for shortness of breath.   Cardiovascular: Negative for chest pain.  Gastrointestinal: Negative for abdominal pain, constipation, diarrhea, nausea and vomiting.  Genitourinary: Negative for difficulty urinating.  Musculoskeletal: Positive for arthralgias (shoulder aches  frequently). Negative for myalgias.  Neurological: Negative for headaches.  Psychiatric/Behavioral: Negative for sleep disturbance.     Social History   Tobacco Use  Smoking Status Never Smoker  Smokeless Tobacco Never Used  Tobacco Comment   never used tobacco       Objective:     Wt Readings from Last 3 Encounters:  01/26/19 185 lb 9.6 oz (84.2 kg)  12/23/18 196 lb (88.9 kg)  11/18/18 182 lb 9.6 oz (82.8 kg)     Exam deferred. Pt. Harboring due to COVID 19. Phone visit performed.   Assessment & Plan:   1. Type 2 diabetes mellitus with stage 3b chronic kidney disease, without long-term current use of insulin ([place])   2. Exposure to COVID-19 virus   3. Hypertension associated with diabetes ([place])     Pt. Advised regarding CoVID exposure. Will get vaccine as soon as available. [facility] diabetes care as fasting remains high. Pt. Is due for Aic. Will be done next month withendocrinologist.  No orders of the defined types were placed in this encounter.     Diagnoses and all orders for this visit:  Type 2 diabetes mellitus with stage 3b chronic kidney disease, without long-term current use of insulin ([place])  Exposure to COVID-19 virus  Hypertension associated with diabetes [facility])    Virtual Visit via telephone Note  I discussed the limitations, risks, security and privacy concerns of performing  an evaluation and management service by telephone and the availability of in person appointments. The patient was identified with two identifiers. Pt.expressed understanding and agreed to proceed. Pt. Is at home. Dr. Livia Snellen is in his office.  Follow Up Instructions:   I discussed the assessment and treatment plan with the patient. The patient was provided an opportunity to ask questions and all were answered. The patient agreed with the plan and demonstrated an understanding of the instructions.   The patient was advised to call back or seek an in-person evaluation if the  symptoms worsen or if the condition fails to improve as anticipated.   Total minutes including chart review and phone contact time: 24   Follow up plan: Return in about 6 months (around 09/26/2019).  Claretta Fraise, MD [place]

## 2019-04-03 ENCOUNTER — Telehealth: Payer: Self-pay | Admitting: Internal Medicine

## 2019-04-03 NOTE — Telephone Encounter (Signed)
Request Reference Number: [id number]. PRALUENT INJ 150MG /ML is approved through 12/31/202

## 2019-04-03 NOTE — Telephone Encounter (Signed)
PA for praluent 150mg /mL submitted via CMM Key: [id number] - PA Case ID: [id number]

## 2019-04-04 ENCOUNTER — Telehealth: Payer: Self-pay | Admitting: Internal Medicine

## 2019-04-04 ENCOUNTER — Other Ambulatory Visit: Payer: Self-pay

## 2019-04-04 MED ORDER — PRALUENT 150 MG/ML ~~LOC~~ SOAJ: 1.0000 | SUBCUTANEOUS | 11 refills | Status: DC

## 2019-04-04 NOTE — Telephone Encounter (Signed)
New Message      *STAT* If patient is at the pharmacy, call can be transferred to refill team.   1. Which medications need to be refilled? (please list name of each medication and dose if known) Alirocumab (PRALUENT) 150 MG/ML SOAJ  2. Which pharmacy/location (including street and city if local pharmacy) is medication to be sent to? [place], [place]  3. Do they need a 30 day or 90 day supply? 53     Pt says she has been getting the medication for 3-4 years from a specialty Drug store but they wont be carrying the medication anymore and is needing a new script to be sent to the [facility]

## 2019-04-13 ENCOUNTER — Other Ambulatory Visit: Payer: Self-pay | Admitting: Family Medicine

## 2019-04-16 ENCOUNTER — Ambulatory Visit: Payer: Medicare Other | Attending: Internal Medicine

## 2019-04-16 DIAGNOSIS — Z23 Encounter for immunization: Secondary | ICD-10-CM | POA: Insufficient documentation

## 2019-04-16 NOTE — Progress Notes (Signed)
   Covid-19 Vaccination Clinic  Name:  Carmen [name]    MRN: [id number] DOB: [birth year]  04/16/2019  [name] was observed post Covid-19 immunization for 30 minutes based on pre-vaccination screening without incidence. She was provided with Vaccine Information Sheet and instruction to access the V-Safe system.   [name] was instructed to call 911 with any severe reactions post vaccine: Marland Kitchen Difficulty breathing  . Swelling of your face and throat  . A fast heartbeat  . A bad rash all over your body  . Dizziness and weakness    Immunizations Administered    Name Date Dose VIS Date Route   Pfizer COVID-19 Vaccine 04/16/2019 12:56 PM 0.3 mL 03/03/2019 Intramuscular   Manufacturer: [place]   Lot: [id number]   [place]: [id number]

## 2019-04-17 ENCOUNTER — Encounter: Payer: Self-pay | Admitting: Internal Medicine

## 2019-04-25 ENCOUNTER — Other Ambulatory Visit: Payer: Medicare Other

## 2019-04-25 ENCOUNTER — Ambulatory Visit: Payer: Medicare Other | Admitting: Hematology & Oncology

## 2019-05-08 ENCOUNTER — Ambulatory Visit: Payer: Medicare Other | Attending: Internal Medicine

## 2019-05-08 DIAGNOSIS — Z23 Encounter for immunization: Secondary | ICD-10-CM | POA: Insufficient documentation

## 2019-05-08 NOTE — Progress Notes (Signed)
   Covid-19 Vaccination Clinic  Name:  Carmen [name]    MRN: [id number] DOB: 03-19-1941  05/08/2019  [name] was observed post Covid-19 immunization for 15 minutes without incidence. She was provided with Vaccine Information Sheet and instruction to access the V-Safe system.   [name] was instructed to call 911 with any severe reactions post vaccine: Marland Kitchen Difficulty breathing  . Swelling of your face and throat  . A fast heartbeat  . A bad rash all over your body  . Dizziness and weakness    Immunizations Administered    Name Date Dose VIS Date Route   Pfizer COVID-19 Vaccine 05/08/2019 12:10 PM 0.3 mL 03/03/2019 Intramuscular   Manufacturer: [place]   Lot: [id number]   [place]: [id number]

## 2019-05-15 ENCOUNTER — Other Ambulatory Visit: Payer: Self-pay

## 2019-05-17 ENCOUNTER — Other Ambulatory Visit: Payer: Self-pay

## 2019-05-17 ENCOUNTER — Telehealth: Payer: Self-pay | Admitting: Family Medicine

## 2019-05-17 ENCOUNTER — Encounter: Payer: Self-pay | Admitting: Internal Medicine

## 2019-05-17 ENCOUNTER — Ambulatory Visit (INDEPENDENT_AMBULATORY_CARE_PROVIDER_SITE_OTHER): Payer: Medicare Other | Admitting: Internal Medicine

## 2019-05-17 VITALS — BP 138/72 | HR 70 | Temp 98.2°F | Ht 64.0 in | Wt 187.6 lb

## 2019-05-17 DIAGNOSIS — E1165 Type 2 diabetes mellitus with hyperglycemia: Secondary | ICD-10-CM | POA: Diagnosis not present

## 2019-05-17 DIAGNOSIS — N1832 Chronic kidney disease, stage 3b: Secondary | ICD-10-CM

## 2019-05-17 DIAGNOSIS — E119 Type 2 diabetes mellitus without complications: Secondary | ICD-10-CM

## 2019-05-17 DIAGNOSIS — E1142 Type 2 diabetes mellitus with diabetic polyneuropathy: Secondary | ICD-10-CM | POA: Diagnosis not present

## 2019-05-17 DIAGNOSIS — E1121 Type 2 diabetes mellitus with diabetic nephropathy: Secondary | ICD-10-CM | POA: Diagnosis not present

## 2019-05-17 LAB — POCT GLYCOSYLATED HEMOGLOBIN (HGB A1C): Hemoglobin A1C: 7.9 % — AB (ref 4.0–5.6)

## 2019-05-17 MED ORDER — RYBELSUS 3 MG PO TABS: 3.0000 mg | ORAL_TABLET | Freq: Every day | ORAL | 3 refills | Status: DC

## 2019-05-17 NOTE — Chronic Care Management (AMB) (Signed)
  Chronic Care Management   Outreach Note  05/17/2019 Name: Carmen [name] MRN: [id number] DOB: 09-01-1940  Carmen [name] is a 79 y.o. year old female who is a primary care patient of Stacks, Cletus Gash, MD. I reached out to Carmen [name] by phone today in response to a referral sent by Carmen [name]'s health plan.     An unsuccessful telephone outreach was attempted today. The patient was referred to the case management team for assistance with care management and care coordination.   Follow Up Plan: A HIPPA compliant phone message was left for the patient providing contact information and requesting a return call.  The care management team will reach out to the patient again over the next 7 days.  If patient returns call to provider office, please advise to call [facility]  at [place], [place], [place], [place] [address] Direct Dial: [phone] [email or web address]

## 2019-05-17 NOTE — Progress Notes (Signed)
Name: Carmen [name]  Age/ Sex: 79 y.o., female   MRN/ DOB: [id number], [birth year]     PCP: Claretta Fraise, MD   Reason for Endocrinology Evaluation: Type 2 Diabetes Mellitus  Initial Endocrine Consultative Visit: 09/14/2018    PATIENT IDENTIFIER: Carmen [name] is a 79 y.o. female with a past medical history of HTN, PVC, neuromuscular disorder, hemochromatosis and CAD (S/P CABG) . The patient has followed with Endocrinology clinic since 09/14/2018 for consultative assistance with management of her diabetes.  DIABETIC HISTORY:  Carmen [name] was diagnosed with T2DM in 2016. Has tried oral glycemic agents in the past,Januvia- swelling , Trulicity - nausea , metformin - elevated LFT's . Her hemoglobin A1c has ranged from 6.6% in 2016, peaking at 8.6% in 2020.  Wilder Glade stopped 10/2018 due to vertigo   SUBJECTIVE:   During the last visit (01/26/2019): A1c was 8.2 %. We increased  Glipizide    Today (05/17/2019): Carmen [name] is here for a 3 month follow up on diabetes management.  She checks her blood sugars 2 times daily, preprandial to breakfast. The patient has not had hypoglycemic episodes since the last clinic visit. Otherwise, the patient has not required any recent emergency interventions for hypoglycemia and has not had recent hospitalizations secondary to hyper or hypoglycemic episodes.     ROS: As per HPI and as detailed below: Review of Systems  Gastrointestinal: Negative for diarrhea and nausea.  Genitourinary: Negative for frequency.  Endo/Heme/Allergies: Negative for polydipsia.       HOME DIABETES REGIMEN:   Glipizide 10 mg 2 tabs before breakfast and 1 tablet before supper      GLUCOSE LOG:  139- 314 mg/dL    Eye exam 11/2018  HISTORY:  Past Medical History:  Past Medical History:  Diagnosis Date  . Arthritis   . Asymptomatic PVCs   . Bilateral shoulder pain   . CAD (coronary artery disease)   . Cancer ([facility]) 11/2015   melanomax4  right  upper arm  . Chronic renal insufficiency, stage 3 (moderate)   . Diabetes mellitus without complication ([facility])   . Diverticulosis   . Endometrial polyp   . External hemorrhoids   . Gout   . Hemochromatosis, hereditary ([facility]) 11/24/2012  . History of anemia   . History of duodenal ulcer 1990  . Hyperlipidemia   . Hypertension   . Internal hemorrhoids   . Jaundice    age 20 or 68  . Myocardial infarction ([facility]) 2007  . Neuromuscular disorder ([place])    peripheral neuropathy  . PMB (postmenopausal bleeding)   . PONV (postoperative nausea and vomiting)   . Prolapse of female pelvic organs    uses pessary  . Seizures ([place])    had one at Dr. Antonieta Pert office after getting blood drawn  . Tick bite 08/12/2017   had 3 bites  . Vitamin D deficiency    Past Surgical History:  Past Surgical History:  Procedure Laterality Date  . BREAST SURGERY     left breast lump--benign  . CARDIAC CATHETERIZATION  06/03/2005  . COLONOSCOPY  11/28/2001  . CORONARY ARTERY BYPASS GRAFT  2007   x2 Dr. Roxan Hockey, [place] to LAD, SVG to PDA  . DILATATION & CURETTAGE/HYSTEROSCOPY WITH MYOSURE N/A 09/28/2017   Procedure: DILATATION & CURETTAGE/HYSTEROSCOPY WITH MYOSURE;  Surgeon: Molli Posey, MD;  Location: [facility];  Service: Gynecology;  Laterality: N/A;  . LIPOMA EXCISION     back  . UPPER GI ENDOSCOPY  01/20/1989  Social History:  reports that she has never smoked. She has never used smokeless tobacco. She reports current alcohol use. She reports that she does not use drugs. Family History:  Family History  Problem Relation Age of Onset  . Stroke Mother   . Hypertension Mother   . Neuropathy Mother   . Stroke Father   . Hypertension Father   . Diabetes Father   . Cancer Sister 30       sarcoma  . Diabetes Brother   . Arthritis Sister   . Arthritis Sister   . Lupus Sister   . Hemachromatosis Sister   . Arthritis Sister   . Hemachromatosis Sister   . Hemachromatosis Sister    . Diverticulitis Sister   . Diabetes Brother   . Diabetes Brother   . Hemachromatosis Brother      HOME MEDICATIONS: Allergies as of 05/17/2019      Reactions   Cymbalta [duloxetine Hcl] Other (See Comments)   Profuse sweating   Januvia [sitagliptin] Swelling   Meloxicam Nausea And Vomiting   Trulicity [dulaglutide] Nausea Only   Diclofenac Diarrhea   Farxiga [dapagliflozin] Other (See Comments)   Dizziness   Shellfish Allergy Other (See Comments)   Gout   Tape    Causes skin irritation   Crestor [rosuvastatin] Other (See Comments)   Muscle aches on 5 mg daily and 10 mg twice weekly   Lipitor [atorvastatin] Other (See Comments)   Muscle aches   Livalo [pitavastatin] Other (See Comments)      Sulfa Antibiotics Rash   Zetia [ezetimibe] Other (See Comments)   Muscle aches   Zocor [simvastatin] Other (See Comments)   Muscle aches       Medication List       Accurate as of May 17, 2019  2:43 PM. If you have any questions, ask your nurse or doctor.        Accu-Chek [employer] Kit Use to check Blood Sugars   allopurinol 100 MG tablet Commonly known as: ZYLOPRIM TAKE  (1)  TABLET TWICE A DAY.   aspirin 81 MG tablet Take 81 mg by mouth daily.   colchicine 0.6 MG tablet TAKE (1) TABLET DAILY AS NEEDED.   fluticasone 50 MCG/ACT nasal spray Commonly known as: FLONASE SPRAY 1 SPRAY IN EACH NOSTRIL ONCE DAILY.   furosemide 20 MG tablet Commonly known as: LASIX Take 1 tablet (20 mg total) by mouth daily.   glipiZIDE 10 MG tablet Commonly known as: GLUCOTROL Take 2 tablets (20 mg total) by mouth daily before breakfast AND 1 tablet (10 mg total) daily before supper.   glucose blood test strip Commonly known as: ONE TOUCH ULTRA TEST CHECK BLOOD SUGAR 2 TIMES A DAY   icosapent Ethyl 1 g capsule Commonly known as: Vascepa Take 2 capsules (2 g total) by mouth 2 (two) times daily.   meclizine 12.5 MG tablet Commonly known as: ANTIVERT Take 1 tablet  (12.5 mg total) by mouth 3 (three) times daily as needed for dizziness.   metoprolol tartrate 25 MG tablet Commonly known as: LOPRESSOR TAKE  (1)  TABLET TWICE A DAY.   olmesartan 20 MG tablet Commonly known as: BENICAR TAKE 1 TABLET DAILY   Praluent 150 MG/ML [place] Generic drug: Alirocumab Inject 1 Dose into the skin every 14 (fourteen) days.   pregabalin 50 MG capsule Commonly known as: LYRICA TAKE  (1)  CAPSULE  TWICE DAILY.   Restasis 0.05 % ophthalmic emulsion Generic drug: cycloSPORINE   Rybelsus  3 MG Tabs Generic drug: Semaglutide Take 3 mg by mouth daily. Started by: Carmen Sciara, MD   Vitamin D (Ergocalciferol) 1.25 MG (50000 UNIT) Caps capsule Commonly known as: DRISDOL Take 1 capsule (50,000 Units total) by mouth 2 (two) times a week.   Voltaren 1 % Gel Generic drug: diclofenac Sodium APPLY 4 GRAMS TO AFFECTED AREA(S) 4 TIMES A DAY AS NEEDED   diclofenac Sodium 1 % Gel Commonly known as: VOLTAREN APPLY 4 GRAMS TO AFFECTED AREA(S) 4 TIMES A DAY AS NEEDED        OBJECTIVE:   Vital Signs: BP 138/72 (BP Location: Left Arm, Patient Position: Sitting, Cuff Size: Large)   Pulse 70   Temp 98.2 F (36.8 C)   Ht '5\' 4"'$  (1.626 m)   Wt 187 lb 9.6 oz (85.1 kg)   SpO2 97%   BMI 32.20 kg/m   Wt Readings from Last 3 Encounters:  05/17/19 187 lb 9.6 oz (85.1 kg)  01/26/19 185 lb 9.6 oz (84.2 kg)  12/23/18 196 lb (88.9 kg)     Exam: General: Pt appears well and is in NAD  Lungs: Clear with good BS bilat with no rales, rhonchi, or wheezes  Heart: RRR with normal S1 and S2 and no gallops; no murmurs; no rub  Extremities: No pretibial edema.  Skin: Normal texture and temperature to palpation.   Neuro: MS is good with appropriate affect, pt is alert and Ox3     DM foot exam: 01/26/2019  The skin of the feet is intact without sores or ulcerations. The pedal pulses are 2+ on right and 2+ on left. The sensation is decreased to a screening 5.07, 10  gram monofilament bilaterally       DATA REVIEWED:  Lab Results  Component Value Date   HGBA1C 7.9 (A) 05/17/2019   HGBA1C 8.2 (A) 01/26/2019   HGBA1C 7.6 (A) 10/26/2018   Lab Results  Component Value Date   MICROALBUR <0.7 01/26/2019   LDLCALC 72 12/02/2018   CREATININE 1.18 (H) 12/23/2018    Lab Results  Component Value Date   CHOL 153 12/02/2018   HDL 37 (L) 12/02/2018   LDLCALC 72 12/02/2018   LDLDIRECT 75 12/02/2018   TRIG 271 (H) 12/02/2018   CHOLHDL 4.1 12/02/2018         ASSESSMENT / PLAN / RECOMMENDATIONS:   1) Type 2 Diabetes Mellitus, Sub-Optimally  controlled, With neuropathic and CKD III complications - Most recent A1c of 7.9 %. Goal A1c < 7.5 %.     - Some improvement in her A1c, she continues to have BG's > 200 at supper time , patient states she does drink tomato juice and cranberry juice at times in the afternoon which could explain hypoglycemia suppertime.  -Patient will keep track of her glucose when she does not drink any of the juice as mentioned above to see if that makes a difference in her glycemic control. -She is interested in trying Ozempic, one of her friends has tried it and had good results with it -I did offer her Rybelsus which is the oral form of Ozempic, we did discuss GI side effects, patient encouraged to contact us with any concerns.   MEDICATIONS: - Glipizide 10 mg , 2 tabs with Breakfast and 1 tablet with Supper  - Rybelsus 3 mg daily with breakfast   EDUCATION / INSTRUCTIONS:  BG monitoring instructions: Patient is instructed to check her blood sugars 2 times a day, fasting and supper time .  Call [facility] if: BG persistently < 70 or > 300. . I reviewed the Rule of 15 for the treatment of hypoglycemia in detail with the patient. Literature supplied.     F/U in 3 months    Signed electronically by: Mack Guise, MD  [facility]  [facility] [address] [place], [place] [address] Phone: [phone] FAX: [phone]   CC: [address], [place] [address] Phone: [phone]  Fax: [phone]  Return to Endocrinology clinic as below: Future Appointments  Date Time Provider [place]  06/27/2019 11:30 AM [facility] None  06/27/2019 12:00 PM Volanda Napoleon, MD [facility] None  11/16/2019  2:30 PM [facility]AL WELLNESS VISIT [facility] None

## 2019-05-17 NOTE — Patient Instructions (Signed)
-   Glipizide 10 mg, 2 tablets before breakfast and 1 tablet before supper   - Start Rybelsus 3 mg daily with Breakfast    - HOW TO TREAT LOW BLOOD SUGARS (Blood sugar LESS THAN 70 MG/DL)  Please follow the RULE OF 15 for the treatment of hypoglycemia treatment (when your (blood sugars are less than 70 mg/dL)    STEP 1: Take 15 grams of carbohydrates when your blood sugar is low, which includes:   3-4 GLUCOSE TABS  OR  3-4 OZ OF JUICE OR REGULAR SODA OR  ONE TUBE OF GLUCOSE GEL     STEP 2: RECHECK blood sugar in 15 MINUTES STEP 3: If your blood sugar is still low at the 15 minute recheck --> then, go back to STEP 1 and treat AGAIN with another 15 grams of carbohydrates.

## 2019-05-18 ENCOUNTER — Encounter: Payer: Self-pay | Admitting: Internal Medicine

## 2019-05-22 NOTE — Chronic Care Management (AMB) (Signed)
Chronic Care Management   Note  05/22/2019 Name: [name] MRN: [id number] DOB: 1940-06-15  Carmen [name] is a 79 y.o. year old female who is a primary care patient of Stacks, Cletus Gash, MD. I reached out to [name] by phone today in response to a referral sent by [name]'s health plan.     [name] was given information about Chronic Care Management services today including:  1. CCM service includes personalized support from designated clinical staff supervised by her physician, including individualized plan of care and coordination with other care providers 2. 24/7 contact phone numbers for assistance for urgent and routine care needs. 3. Service will only be billed when office clinical staff spend 20 minutes or more in a month to coordinate care. 4. Only one practitioner may furnish and bill the service in a calendar month. 5. The patient may stop CCM services at any time (effective at the end of the month) by phone call to the office staff. 6. The patient will be responsible for cost sharing (co-pay) of up to 20% of the service fee (after annual deductible is met).  Patient agreed to services and verbal consent obtained.   Follow up plan: Telephone appointment with care management team member scheduled for:07/11/2019  Noreene Larsson, [address], [place], [place] [address] Direct Dial: [phone] [email or web address]: [email or web address]

## 2019-06-07 ENCOUNTER — Other Ambulatory Visit: Payer: Self-pay | Admitting: Family Medicine

## 2019-06-08 ENCOUNTER — Other Ambulatory Visit: Payer: Self-pay | Admitting: Internal Medicine

## 2019-06-22 ENCOUNTER — Other Ambulatory Visit: Payer: Self-pay | Admitting: Family Medicine

## 2019-06-22 NOTE — Telephone Encounter (Signed)
Appointment scheduled.

## 2019-06-26 ENCOUNTER — Ambulatory Visit (INDEPENDENT_AMBULATORY_CARE_PROVIDER_SITE_OTHER): Payer: Medicare Other | Admitting: Family Medicine

## 2019-06-26 ENCOUNTER — Encounter: Payer: Self-pay | Admitting: Family Medicine

## 2019-06-26 DIAGNOSIS — E1159 Type 2 diabetes mellitus with other circulatory complications: Secondary | ICD-10-CM | POA: Diagnosis not present

## 2019-06-26 DIAGNOSIS — E1121 Type 2 diabetes mellitus with diabetic nephropathy: Secondary | ICD-10-CM

## 2019-06-26 DIAGNOSIS — N1832 Chronic kidney disease, stage 3b: Secondary | ICD-10-CM

## 2019-06-26 DIAGNOSIS — E782 Mixed hyperlipidemia: Secondary | ICD-10-CM | POA: Diagnosis not present

## 2019-06-26 DIAGNOSIS — E1140 Type 2 diabetes mellitus with diabetic neuropathy, unspecified: Secondary | ICD-10-CM

## 2019-06-26 DIAGNOSIS — I10 Essential (primary) hypertension: Secondary | ICD-10-CM

## 2019-06-26 MED ORDER — PREGABALIN 50 MG PO CAPS: ORAL_CAPSULE | ORAL | 1 refills | Status: DC

## 2019-06-26 MED ORDER — ICOSAPENT ETHYL 1 G PO CAPS: 2.0000 g | ORAL_CAPSULE | Freq: Two times a day (BID) | ORAL | 11 refills | Status: DC

## 2019-06-26 NOTE — Progress Notes (Signed)
Subjective:    Patient ID: Carmen [name], female    DOB: 1941-01-16, 79 y.o.   MRN: [id number]   HPI: JASALYN PLATE is a 79 y.o. female presenting for neuropathy. Feet will burn if she doesn't take her pregabalin.    presents for  follow-up of hypertension. Patient has no history of headache chest pain or shortness of breath or recent cough. Patient also denies symptoms of TIA such as focal numbness or weakness. Patient denies side effects from medication. States taking it regularly.  Now working with endocrine for diabetes. A1c down a little last month. Using rybelsus now.      Depression screen [facility] 2/9 11/10/2018 10/27/2018 08/08/2018 05/13/2018 12/28/2017  Decreased Interest 0 0 0 0 0  Down, Depressed, Hopeless 0 0 0 0 0  PHQ - 2 Score 0 0 0 0 0  Altered sleeping - - - - -  Tired, decreased energy - - - - -  Change in appetite - - - - -  Feeling bad or failure about yourself  - - - - -  Trouble concentrating - - - - -  Moving slowly or fidgety/restless - - - - -  Suicidal thoughts - - - - -  PHQ-9 Score - - - - -  Difficult doing work/chores - - - - -  Some recent data might be hidden     Relevant past medical, surgical, family and social history reviewed and updated as indicated.  Interim medical history since our last visit reviewed. Allergies and medications reviewed and updated.  ROS:  Review of Systems  Constitutional: Negative.   HENT: Negative.   Eyes: Negative for visual disturbance.  Respiratory: Negative for shortness of breath.   Cardiovascular: Negative for chest pain.  Gastrointestinal: Negative for abdominal pain.  Musculoskeletal: Negative for arthralgias.     Social History   Tobacco Use  Smoking Status Never Smoker  Smokeless Tobacco Never Used  Tobacco Comment   never used tobacco       Objective:     Wt Readings from Last 3 Encounters:  05/17/19 187 lb 9.6 oz (85.1 kg)  01/26/19 185 lb 9.6 oz (84.2 kg)  12/23/18 196 lb (88.9  kg)     Exam deferred. Pt. [place] due to COVID 19. Phone visit performed.   Assessment & Plan:   1. Hypertension associated with diabetes ([place])   2. Hemochromatosis, hereditary ([place])   3. Type 2 diabetes mellitus with stage 3b chronic kidney disease, without long-term current use of insulin ([place])   4. Type 2 diabetes mellitus with diabetic neuropathy, without long-term current use of insulin ([place])   5. Mixed hyperlipidemia     Meds ordered this encounter  Medications  . icosapent Ethyl (VASCEPA) 1 g capsule    Sig: Take 2 capsules (2 g total) by mouth 2 (two) times daily.    Dispense:  120 capsule    Refill:  11  . pregabalin (LYRICA) 50 MG capsule    Sig: TAKE  (1)  CAPSULE  TWICE DAILY.    Dispense:  180 capsule    Refill:  1    No orders of the defined types were placed in this encounter.     Diagnoses and all orders for this visit:  Hypertension associated with diabetes ([place])  Hemochromatosis, hereditary ([place])  Type 2 diabetes mellitus with stage 3b chronic kidney disease, without long-term current use of insulin ([place])  Type 2 diabetes mellitus with diabetic neuropathy, without  long-term current use of insulin ([facility]) -     pregabalin (LYRICA) 50 MG capsule; TAKE  (1)  CAPSULE  TWICE DAILY.  Mixed hyperlipidemia -     icosapent Ethyl (VASCEPA) 1 g capsule; Take 2 capsules (2 g total) by mouth 2 (two) times daily.  HTN meds, benicar and metoprolol had been refilled 2 weeks ago. Continue as is.   Virtual Visit via telephone Note  I discussed the limitations, risks, security and privacy concerns of performing an evaluation and management service by telephone and the availability of in person appointments. The patient was identified with two identifiers. Pt.expressed understanding and agreed to proceed. Pt. Is at home. Dr. Livia Snellen is in his office.  Follow Up Instructions:   I discussed the assessment and treatment plan with the patient. The patient was provided an  opportunity to ask questions and all were answered. The patient agreed with the plan and demonstrated an understanding of the instructions.   The patient was advised to call back or seek an in-person evaluation if the symptoms worsen or if the condition fails to improve as anticipated.   Total minutes including chart review and phone contact time: 28   Follow up plan: Return in about 6 months (around 12/26/2019).  Claretta Fraise, MD [place]

## 2019-06-27 ENCOUNTER — Inpatient Hospital Stay: Payer: Medicare Other | Admitting: Hematology & Oncology

## 2019-06-27 ENCOUNTER — Other Ambulatory Visit: Payer: Self-pay

## 2019-06-27 ENCOUNTER — Encounter: Payer: Self-pay | Admitting: Hematology & Oncology

## 2019-06-27 ENCOUNTER — Inpatient Hospital Stay: Payer: Medicare Other

## 2019-06-27 ENCOUNTER — Inpatient Hospital Stay: Payer: Medicare Other | Attending: Hematology & Oncology

## 2019-06-27 ENCOUNTER — Inpatient Hospital Stay (HOSPITAL_BASED_OUTPATIENT_CLINIC_OR_DEPARTMENT_OTHER): Payer: Medicare Other | Admitting: Hematology & Oncology

## 2019-06-27 DIAGNOSIS — Z7984 Long term (current) use of oral hypoglycemic drugs: Secondary | ICD-10-CM | POA: Diagnosis not present

## 2019-06-27 DIAGNOSIS — Z7982 Long term (current) use of aspirin: Secondary | ICD-10-CM | POA: Insufficient documentation

## 2019-06-27 DIAGNOSIS — Z79899 Other long term (current) drug therapy: Secondary | ICD-10-CM | POA: Diagnosis not present

## 2019-06-27 LAB — CMP (CANCER CENTER ONLY)
ALT: 18 U/L (ref 0–44)
AST: 17 U/L (ref 15–41)
Albumin: 4.3 g/dL (ref 3.5–5.0)
Alkaline Phosphatase: 93 U/L (ref 38–126)
Anion gap: 9 (ref 5–15)
BUN: 23 mg/dL (ref 8–23)
CO2: 30 mmol/L (ref 22–32)
Calcium: 10 mg/dL (ref 8.9–10.3)
Chloride: 101 mmol/L (ref 98–111)
Creatinine: 1.29 mg/dL — ABNORMAL HIGH (ref 0.44–1.00)
GFR, Est AFR Am: 46 mL/min — ABNORMAL LOW (ref >60)
GFR, Estimated: 39 mL/min — ABNORMAL LOW (ref >60)
Glucose, Bld: 253 mg/dL — ABNORMAL HIGH (ref 70–99)
Potassium: 4.1 mmol/L (ref 3.5–5.1)
Sodium: 140 mmol/L (ref 135–145)
Total Bilirubin: 0.5 mg/dL (ref 0.3–1.2)
Total Protein: 6.9 g/dL (ref 6.5–8.1)

## 2019-06-27 LAB — CBC WITH DIFFERENTIAL (CANCER CENTER ONLY)
Abs Immature Granulocytes: 0.05 10*3/uL (ref 0.00–0.07)
Basophils Absolute: 0.1 10*3/uL (ref 0.0–0.1)
Basophils Relative: 1 %
Eosinophils Absolute: 0.3 10*3/uL (ref 0.0–0.5)
Eosinophils Relative: 3 %
HCT: 41.6 % (ref 36.0–46.0)
Hemoglobin: 14.1 g/dL (ref 12.0–15.0)
Immature Granulocytes: 1 %
Lymphocytes Relative: 36 %
Lymphs Abs: 3.4 10*3/uL (ref 0.7–4.0)
MCH: 30.5 pg (ref 26.0–34.0)
MCHC: 33.9 g/dL (ref 30.0–36.0)
MCV: 90 fL (ref 80.0–100.0)
Monocytes Absolute: 0.6 10*3/uL (ref 0.1–1.0)
Monocytes Relative: 6 %
Neutro Abs: 5 10*3/uL (ref 1.7–7.7)
Neutrophils Relative %: 53 %
Platelet Count: 157 10*3/uL (ref 150–400)
RBC: 4.62 MIL/uL (ref 3.87–5.11)
RDW: 13.4 % (ref 11.5–15.5)
WBC Count: 9.4 10*3/uL (ref 4.0–10.5)
nRBC: 0 % (ref 0.0–0.2)

## 2019-06-27 LAB — IRON AND TIBC
Iron: 72 ug/dL (ref 41–142)
Saturation Ratios: 26 % (ref 21–57)
TIBC: 273 ug/dL (ref 236–444)
UIBC: 200 ug/dL (ref 120–384)

## 2019-06-27 LAB — FERRITIN: Ferritin: 104 ng/mL (ref 11–307)

## 2019-06-27 NOTE — Progress Notes (Signed)
Hematology and Oncology Follow Up Visit  Carmen [name] [id number] 1941-02-28 79 y.o. 06/27/2019   Principle Diagnosis:  Hemochromatosis (double heterozygote for C282Y and S65C mutations).  Current Therapy:    Phlebotomy to maintain ferritin less than 100     Interim History:  [name] is back for followup.  We last saw her 6 months ago.  We saw her back in October 2020.  At that time, her ferritin was 41 with an iron saturation of 26%.  I think her last phlebotomy was by back in July 2020.  She is doing well.  She had no problems over the Easter holiday.  She is doing well overall with her health.  Her blood sugars have been on the high side.  Today her blood sugar was 253.  She has had no cough.  She has had no problems with the coronavirus.  She has had her vaccines.  She has had no change in bowel or bladder habits.  She has had no rashes.  She is enjoying her new great granddaughter.  Currently, her performance status is ECOG 1.     Medications:  Current Outpatient Medications:  .  Alirocumab (PRALUENT) 150 MG/ML SOAJ, Inject 1 Dose into the skin every 14 (fourteen) days., Disp: 2 pen, Rfl: 11 .  allopurinol (ZYLOPRIM) 100 MG tablet, TAKE  (1)  TABLET TWICE A DAY., Disp: 180 tablet, Rfl: 1 .  aspirin 81 MG tablet, Take 81 mg by mouth daily., Disp: , Rfl:  .  colchicine 0.6 MG tablet, TAKE (1) TABLET DAILY AS NEEDED., Disp: 30 tablet, Rfl: 1 .  diclofenac Sodium (VOLTAREN) 1 % GEL, APPLY 4 GRAMS TO AFFECTED AREA(S) 4 TIMES A DAY AS NEEDED, Disp: 100 g, Rfl: 0 .  fluticasone (FLONASE) 50 MCG/ACT nasal spray, SPRAY 1 SPRAY IN EACH NOSTRIL ONCE DAILY., Disp: 16 g, Rfl: 4 .  furosemide (LASIX) 20 MG tablet, Take 1 tablet (20 mg total) by mouth daily., Disp: 30 tablet, Rfl: 11 .  glipiZIDE (GLUCOTROL) 10 MG tablet, Take 2 tablets daily before breakfast AND 1 tablet daily beforesupper., Disp: 90 tablet, Rfl: 0 .  glucose blood (ONE TOUCH ULTRA TEST) test strip, CHECK  BLOOD SUGAR 2 TIMES A DAY, Disp: 100 each, Rfl: prn .  icosapent Ethyl (VASCEPA) 1 g capsule, Take 2 capsules (2 g total) by mouth 2 (two) times daily., Disp: 120 capsule, Rfl: 11 .  Lancets Misc. (ACCU-CHEK FASTCLIX LANCET) KIT, Use to check Blood Sugars, Disp: 1 kit, Rfl: 0 .  meclizine (ANTIVERT) 12.5 MG tablet, Take 1 tablet (12.5 mg total) by mouth 3 (three) times daily as needed for dizziness., Disp: 30 tablet, Rfl: 0 .  metoprolol tartrate (LOPRESSOR) 25 MG tablet, TAKE  (1)  TABLET TWICE A DAY., Disp: 180 tablet, Rfl: 1 .  olmesartan (BENICAR) 20 MG tablet, TAKE 1 TABLET DAILY, Disp: 90 tablet, Rfl: 1 .  pregabalin (LYRICA) 50 MG capsule, TAKE  (1)  CAPSULE  TWICE DAILY., Disp: 180 capsule, Rfl: 1 .  RESTASIS 0.05 % ophthalmic emulsion, , Disp: , Rfl:  .  Semaglutide (RYBELSUS) 3 MG TABS, Take 3 mg by mouth daily., Disp: 30 tablet, Rfl: 3 .  Vitamin D, Ergocalciferol, (DRISDOL) 1.25 MG (50000 UT) CAPS capsule, Take 1 capsule (50,000 Units total) by mouth 2 (two) times a week., Disp: 26 capsule, Rfl: 3 .  VOLTAREN 1 % GEL, APPLY 4 GRAMS TO AFFECTED AREA(S) 4 TIMES A DAY AS NEEDED, Disp: 100 g, Rfl: 0  Allergies:  Allergies  Allergen Reactions  . Cymbalta [Duloxetine Hcl] Other (See Comments)    Profuse sweating  . Januvia [Sitagliptin] Swelling  . Meloxicam Nausea And Vomiting  . Trulicity [Dulaglutide] Nausea Only  . Diclofenac Diarrhea  . Wilder Glade [Dapagliflozin] Other (See Comments)    Dizziness  . Shellfish Allergy Other (See Comments)    Gout  . Tape     Causes skin irritation  . Crestor [Rosuvastatin] Other (See Comments)    Muscle aches on 5 mg daily and 10 mg twice weekly  . Lipitor [Atorvastatin] Other (See Comments)    Muscle aches  . Livalo [Pitavastatin] Other (See Comments)       . Sulfa Antibiotics Rash  . Zetia [Ezetimibe] Other (See Comments)    Muscle aches  . Zocor [Simvastatin] Other (See Comments)    Muscle aches     Past Medical History, Surgical  history, Social history, and Family History were reviewed and updated.  Review of Systems: Review of Systems  Constitutional: Negative.   HENT: Negative.   Eyes: Negative.   Respiratory: Negative.   Cardiovascular: Negative.   Gastrointestinal: Negative.   Genitourinary: Negative.   Musculoskeletal: Negative.   Skin: Negative.   Neurological: Negative.   Endo/Heme/Allergies: Negative.   Psychiatric/Behavioral: Negative.     Physical Exam:  weight is 188 lb (85.3 kg). Her temporal temperature is 97.7 F (36.5 C). Her blood pressure is 146/76 (abnormal) and her pulse is 94. Her respiration is 17 and oxygen saturation is 95%.   Physical Exam Vitals reviewed.  HENT:     Head: Normocephalic and atraumatic.  Eyes:     Pupils: Pupils are equal, round, and reactive to light.  Cardiovascular:     Rate and Rhythm: Normal rate and regular rhythm.     Heart sounds: Normal heart sounds.  Pulmonary:     Effort: Pulmonary effort is normal.     Breath sounds: Normal breath sounds.  Abdominal:     General: Bowel sounds are normal.     Palpations: Abdomen is soft.  Musculoskeletal:        General: No tenderness or deformity. Normal range of motion.     Cervical back: Normal range of motion.  Lymphadenopathy:     Cervical: No cervical adenopathy.  Skin:    General: Skin is warm and dry.     Findings: No erythema or rash.  Neurological:     Mental Status: She is alert and oriented to person, place, and time.  Psychiatric:        Behavior: Behavior normal.        Thought Content: Thought content normal.        Judgment: Judgment normal.      Lab Results  Component Value Date   WBC 9.4 06/27/2019   HGB 14.1 06/27/2019   HCT 41.6 06/27/2019   MCV 90.0 06/27/2019   PLT 157 06/27/2019     Chemistry      Component Value Date/Time   NA 140 06/27/2019 1029   NA 140 08/10/2018 1010   NA 147 (H) 01/22/2017 1451   NA 140 12/31/2015 1052   K 4.1 06/27/2019 1029   K 4.9 (H)  01/22/2017 1451   K 4.2 12/31/2015 1052   CL 101 06/27/2019 1029   CL 106 01/22/2017 1451   CO2 30 06/27/2019 1029   CO2 29 01/22/2017 1451   CO2 23 12/31/2015 1052   BUN 23 06/27/2019 1029   BUN 17 08/10/2018 1010  BUN 23 (H) 01/22/2017 1451   BUN 21.4 12/31/2015 1052   CREATININE 1.29 (H) 06/27/2019 1029   CREATININE 1.6 (H) 01/22/2017 1451   CREATININE 1.4 (H) 12/31/2015 1052      Component Value Date/Time   CALCIUM 10.0 06/27/2019 1029   CALCIUM 10.3 01/22/2017 1451   CALCIUM 9.7 12/31/2015 1052   ALKPHOS 93 06/27/2019 1029   ALKPHOS 65 01/22/2017 1451   ALKPHOS 87 12/31/2015 1052   AST 17 06/27/2019 1029   AST 31 12/31/2015 1052   ALT 18 06/27/2019 1029   ALT 25 01/22/2017 1451   ALT 36 12/31/2015 1052   BILITOT 0.5 06/27/2019 1029   BILITOT 0.42 12/31/2015 1052        Impression and Plan: Carmen [name] is 79 year old white female with hemachromatosis. She is a double heterozygote.  I have to see what her iron studies look like.  I will get them back over the weekend.  We will go ahead and plan to get her back in about 6 months now.    I just am happy that she is doing well overall.  I just wish that her blood sugars were better.    Marland Kitchen Volanda Napoleon, MD 4/6/202111:35 AM

## 2019-06-28 ENCOUNTER — Telehealth: Payer: Self-pay | Admitting: Hematology & Oncology

## 2019-06-28 NOTE — Telephone Encounter (Signed)
Appointments scheduled calendar printed & mailed per 4/6 los

## 2019-07-04 DIAGNOSIS — D485 Neoplasm of uncertain behavior of skin: Secondary | ICD-10-CM | POA: Diagnosis not present

## 2019-07-04 DIAGNOSIS — Z85820 Personal history of malignant melanoma of skin: Secondary | ICD-10-CM | POA: Diagnosis not present

## 2019-07-04 DIAGNOSIS — L820 Inflamed seborrheic keratosis: Secondary | ICD-10-CM | POA: Diagnosis not present

## 2019-07-04 DIAGNOSIS — D2271 Melanocytic nevi of right lower limb, including hip: Secondary | ICD-10-CM | POA: Diagnosis not present

## 2019-07-04 DIAGNOSIS — Z85828 Personal history of other malignant neoplasm of skin: Secondary | ICD-10-CM | POA: Diagnosis not present

## 2019-07-11 ENCOUNTER — Other Ambulatory Visit: Payer: Self-pay | Admitting: Internal Medicine

## 2019-07-11 ENCOUNTER — Ambulatory Visit: Payer: Medicare Other | Admitting: *Deleted

## 2019-07-11 ENCOUNTER — Telehealth: Payer: Medicare Other

## 2019-07-11 DIAGNOSIS — E1122 Type 2 diabetes mellitus with diabetic chronic kidney disease: Secondary | ICD-10-CM

## 2019-07-11 DIAGNOSIS — E782 Mixed hyperlipidemia: Secondary | ICD-10-CM

## 2019-07-11 DIAGNOSIS — E1159 Type 2 diabetes mellitus with other circulatory complications: Secondary | ICD-10-CM

## 2019-07-11 NOTE — Chronic Care Management (AMB) (Signed)
  Chronic Care Management   Initial Visit Outreach Note  07/11/2019 Name: Carmen [name] MRN: [id number] DOB: [birth year]  Referred by: Claretta Fraise, MD Reason for referral : Chronic Care Management (Initial Visit)   An unsuccessful Initial Telephone Visit was attempted today. The patient was referred to the case management team for assistance with care management and care coordination.   RN Care Plan   . Chronic Disease Management Needs       CARE PLAN ENTRY (see longtitudinal plan of care for additional care plan information)  Current Barriers:  . Chronic Disease Management support, education, and care coordination needs related to HTN, CAD, hx of MI, DM, peripheral neuropathy, HLD  Clinical Goals: . Over the next 10 days, patient will be contacted by a Care Guide to reschedule their Initial CCM Visit . Over the next 30 days, patient will have an Initial CCM Visit with a member of the embedded CCM team to discuss self-management of their chronic medical conditions  Interventions: . Chart reviewed in preparation for initial visit telephone call . Collaboration with other care team members as needed . Unsuccessful outreach to patient  . A HIPPA compliant phone message was left for the patient providing contact information and requesting a return call.  . Request sent to care guides to reach out and reschedule patient's initial visit  Patient Self Care Activities: . Undetermined   Initial goal documentation         Follow Up Plan: A HIPPA compliant phone message was left for the patient providing contact information and requesting a return call.  The care management team will reach out to the patient again over the next 10 days.   Chong Sicilian, BSN, RN-BC Embedded Chronic Care Manager [facility] [place] [facility] Direct Dial: [phone]

## 2019-07-12 ENCOUNTER — Telehealth: Payer: Self-pay | Admitting: Family Medicine

## 2019-07-12 NOTE — Chronic Care Management (AMB) (Signed)
  Care Management   Note  07/12/2019 Name: Carmen [name] MRN: [id number] DOB: 01-04-1941  Carmen [name] is a 79 y.o. year old female who is a primary care patient of Stacks, Cletus Gash, MD and is actively engaged with the care management team. I reached out to [name] by phone today to assist with re-scheduling an initial visit with the Licensed Clinical Social Worker for intake appointment.  Follow up plan: Telephone appointment with care management team member scheduled for:08/29/2019.  [place], [place] [id number] Direct Dial: [phone] [email or web address]

## 2019-07-24 ENCOUNTER — Other Ambulatory Visit: Payer: Self-pay

## 2019-07-24 ENCOUNTER — Inpatient Hospital Stay: Payer: Medicare Other | Attending: Hematology & Oncology

## 2019-07-24 MED ORDER — SODIUM CHLORIDE 0.9 % IV SOLN
Freq: Once | INTRAVENOUS | Status: AC
  Filled 2019-07-24: qty 250

## 2019-07-24 NOTE — Patient Instructions (Signed)
Therapeutic Phlebotomy Therapeutic phlebotomy is the planned removal of blood from a person's body for the purpose of treating a medical condition. The procedure is similar to donating blood. Usually, about a pint (470 mL, or 0.47 L) of blood is removed. The average adult has 9-12 pints (4.3-5.7 L) of blood in the body. Therapeutic phlebotomy may be used to treat the following medical conditions:  Hemochromatosis. This is a condition in which the blood contains too much iron.  Polycythemia vera. This is a condition in which the blood contains too many red blood cells.  Porphyria cutanea tarda. This is a disease in which an important part of hemoglobin is not made properly. It results in the buildup of abnormal amounts of porphyrins in the body.  Sickle cell disease. This is a condition in which the red blood cells form an abnormal crescent shape rather than a round shape. Tell a health care provider about:  Any allergies you have.  All medicines you are taking, including vitamins, herbs, eye drops, creams, and over-the-counter medicines.  Any problems you or family members have had with anesthetic medicines.  Any blood disorders you have.  Any surgeries you have had.  Any medical conditions you have.  Whether you are pregnant or may be pregnant. What are the risks? Generally, this is a safe procedure. However, problems may occur, including:  Nausea or light-headedness.  Low blood pressure (hypotension).  Soreness, bleeding, swelling, or bruising at the needle insertion site.  Infection. What happens before the procedure?  Follow instructions from your health care provider about eating or drinking restrictions.  Ask your health care provider about: ? Changing or stopping your regular medicines. This is especially important if you are taking diabetes medicines or blood thinners (anticoagulants). ? Taking medicines such as aspirin and ibuprofen. These medicines can thin your  blood. Do not take these medicines unless your health care provider tells you to take them. ? Taking over-the-counter medicines, vitamins, herbs, and supplements.  Wear clothing with sleeves that can be raised above the elbow.  Plan to have someone take you home from the hospital or clinic.  You may have a blood sample taken.  Your blood pressure, pulse rate, and breathing rate will be measured. What happens during the procedure?   To lower your risk of infection: ? Your health care team will wash or sanitize their hands. ? Your skin will be cleaned with an antiseptic.  You may be given a medicine to numb the area (local anesthetic).  A tourniquet will be placed on your arm.  A needle will be inserted into one of your veins.  Tubing and a collection bag will be attached to that needle.  Blood will flow through the needle and tubing into the collection bag.  The collection bag will be placed lower than your arm to allow gravity to help the flow of blood into the bag.  You may be asked to open and close your hand slowly and continually during the entire collection.  After the specified amount of blood has been removed from your body, the collection bag and tubing will be clamped.  The needle will be removed from your vein.  Pressure will be held on the site of the needle insertion to stop the bleeding.  A bandage (dressing) will be placed over the needle insertion site. The procedure may vary among health care providers and hospitals. What happens after the procedure?  Your blood pressure, pulse rate, and breathing rate will be   measured after the procedure.  You will be encouraged to drink fluids.  Your recovery will be assessed and monitored.  You can return to your normal activities as told by your health care provider. Summary  Therapeutic phlebotomy is the planned removal of blood from a person's body for the purpose of treating a medical condition.  Therapeutic  phlebotomy may be used to treat hemochromatosis, polycythemia vera, porphyria cutanea tarda, or sickle cell disease.  In the procedure, a needle is inserted and about a pint (470 mL, or 0.47 L) of blood is removed. The average adult has 9-12 pints (4.3-5.7 L) of blood in the body.  This is generally a safe procedure, but it can sometimes cause problems such as nausea, light-headedness, or low blood pressure (hypotension). This information is not intended to replace advice given to you by your health care provider. Make sure you discuss any questions you have with your health care provider. Document Revised: 03/25/2017 Document Reviewed: 03/25/2017 Elsevier Patient Education  2020 Elsevier Inc.  

## 2019-07-24 NOTE — Progress Notes (Signed)
Carmen [name] presents today for phlebotomy per MD orders. Phlebotomy procedure started at 1420 with 18 gauge angiocath and ended at 1440. 508 grams removed. 1000 cc.9 NS started  Patient observed for 30 minutes after procedure without any incident. Patient tolerated procedure well. IV needle removed intact.

## 2019-08-03 ENCOUNTER — Ambulatory Visit (INDEPENDENT_AMBULATORY_CARE_PROVIDER_SITE_OTHER): Payer: Medicare Other | Admitting: Otolaryngology

## 2019-08-03 ENCOUNTER — Other Ambulatory Visit: Payer: Self-pay

## 2019-08-03 ENCOUNTER — Encounter (INDEPENDENT_AMBULATORY_CARE_PROVIDER_SITE_OTHER): Payer: Self-pay | Admitting: Otolaryngology

## 2019-08-03 VITALS — Temp 97.7°F

## 2019-08-03 DIAGNOSIS — H6123 Impacted cerumen, bilateral: Secondary | ICD-10-CM

## 2019-08-03 DIAGNOSIS — R42 Dizziness and giddiness: Secondary | ICD-10-CM | POA: Diagnosis not present

## 2019-08-03 NOTE — Progress Notes (Signed)
HPI: Carmen [name] is a 79 y.o. female who presents for evaluation of wax buildup in her ears.  She is also has some chronic symptoms of being off balance.  She uses her arms to steady herself when she walks.  She does not really describe vertigo.  More of just chronic imbalance.  She has very poor hearing in both ears but rarely wears her hearing aids she presents to have her ears cleaned..  Past Medical History:  Diagnosis Date  . Arthritis   . Asymptomatic PVCs   . Bilateral shoulder pain   . CAD (coronary artery disease)   . Cancer ([place]) 11/2015   melanomax4  right upper arm  . Chronic renal insufficiency, stage 3 (moderate)   . Diabetes mellitus without complication ([place])   . Diverticulosis   . Endometrial polyp   . External hemorrhoids   . Gout   . Hemochromatosis, hereditary ([place]) 11/24/2012  . History of anemia   . History of duodenal ulcer 1990  . Hyperlipidemia   . Hypertension   . Internal hemorrhoids   . Jaundice    age 15 or 61  . Myocardial infarction ([place]) 2007  . Neuromuscular disorder ([place])    peripheral neuropathy  . PMB (postmenopausal bleeding)   . PONV (postoperative nausea and vomiting)   . Prolapse of female pelvic organs    uses pessary  . Seizures ([place])    had one at Dr. Antonieta Pert office after getting blood drawn  . Tick bite 08/12/2017   had 3 bites  . Vitamin D deficiency    Past Surgical History:  Procedure Laterality Date  . BREAST SURGERY     left breast lump--benign  . CARDIAC CATHETERIZATION  06/03/2005  . COLONOSCOPY  11/28/2001  . CORONARY ARTERY BYPASS GRAFT  2007   x2 Dr. Roxan Hockey, [place] to LAD, SVG to PDA  . DILATATION & CURETTAGE/HYSTEROSCOPY WITH MYOSURE N/A 09/28/2017   Procedure: DILATATION & CURETTAGE/HYSTEROSCOPY WITH MYOSURE;  Surgeon: Molli Posey, MD;  Location: [facility];  Service: Gynecology;  Laterality: N/A;  . LIPOMA EXCISION     back  . UPPER GI ENDOSCOPY  01/20/1989   Social History    Socioeconomic History  . Marital status: Widowed    Spouse name: Not on file  . Number of children: 3  . Years of education: 77  . Highest education level: High school graduate  Occupational History  . Occupation: retired  Tobacco Use  . Smoking status: Never Smoker  . Smokeless tobacco: Never Used  . Tobacco comment: never used tobacco  Substance and Sexual Activity  . Alcohol use: Yes    Alcohol/week: 0.0 standard drinks    Comment: rare wine  . Drug use: No  . Sexual activity: Not Currently    Birth control/protection: Post-menopausal  Other Topics Concern  . Not on file  Social History Narrative  . Not on file   Social Determinants of Health   Financial Resource Strain: Low Risk   . Difficulty of Paying Living Expenses: Not hard at all  Food Insecurity: No Food Insecurity  . Worried About Charity fundraiser in the Last Year: Never true  . Ran Out of Food in the Last Year: Never true  Transportation Needs: No Transportation Needs  . Lack of Transportation (Medical): No  . Lack of Transportation (Non-Medical): No  Physical Activity: Inactive  . Days of Exercise per Week: 0 days  . Minutes of Exercise per Session: 0 min  Stress:  No Stress Concern Present  . Feeling of Stress : Not at all  Social Connections: Slightly Isolated  . Frequency of Communication with Friends and Family: More than three times a week  . Frequency of Social Gatherings with Friends and Family: Three times a week  . Attends Religious Services: More than 4 times per year  . Active Member of Clubs or Organizations: Yes  . Attends Archivist Meetings: More than 4 times per year  . Marital Status: Widowed   Family History  Problem Relation Age of Onset  . Stroke Mother   . Hypertension Mother   . Neuropathy Mother   . Stroke Father   . Hypertension Father   . Diabetes Father   . Cancer Sister 57       sarcoma  . Diabetes Brother   . Arthritis Sister   . Arthritis Sister   .  Lupus Sister   . Hemachromatosis Sister   . Arthritis Sister   . Hemachromatosis Sister   . Hemachromatosis Sister   . Diverticulitis Sister   . Diabetes Brother   . Diabetes Brother   . Hemachromatosis Brother    Allergies  Allergen Reactions  . Cymbalta [Duloxetine Hcl] Other (See Comments)    Profuse sweating  . Januvia [Sitagliptin] Swelling  . Meloxicam Nausea And Vomiting  . Trulicity [Dulaglutide] Nausea Only  . Diclofenac Diarrhea  . Wilder Glade [Dapagliflozin] Other (See Comments)    Dizziness  . Shellfish Allergy Other (See Comments)    Gout  . Tape     Causes skin irritation  . Crestor [Rosuvastatin] Other (See Comments)    Muscle aches on 5 mg daily and 10 mg twice weekly  . Lipitor [Atorvastatin] Other (See Comments)    Muscle aches  . Livalo [Pitavastatin] Other (See Comments)       . Sulfa Antibiotics Rash  . Zetia [Ezetimibe] Other (See Comments)    Muscle aches  . Zocor [Simvastatin] Other (See Comments)    Muscle aches    Prior to Admission medications   Medication Sig Start Date End Date Taking? Authorizing Provider  Alirocumab (PRALUENT) 150 MG/ML SOAJ Inject 1 Dose into the skin every 14 (fourteen) days. 04/04/19  Yes Hilty, Nadean Corwin, MD  allopurinol (ZYLOPRIM) 100 MG tablet TAKE  (1)  TABLET TWICE A DAY. 06/07/19  Yes Claretta Fraise, MD  aspirin 81 MG tablet Take 81 mg by mouth daily.   Yes [provider]  colchicine 0.6 MG tablet TAKE (1) TABLET DAILY AS NEEDED. 08/08/18  Yes Stacks, Cletus Gash, MD  diclofenac Sodium (VOLTAREN) 1 % GEL APPLY 4 GRAMS TO AFFECTED AREA(S) 4 TIMES A DAY AS NEEDED 04/13/19  Yes Stacks, Cletus Gash, MD  fluticasone (FLONASE) 50 MCG/ACT nasal spray SPRAY 1 SPRAY IN EACH NOSTRIL ONCE DAILY. 08/08/18  Yes Stacks, Cletus Gash, MD  glipiZIDE (GLUCOTROL) 10 MG tablet TAKE 2 TABLETS BEFORE BREAKFAST AND 1 TABLET DAILY BEFORE SUPPER 07/11/19  Yes Shamleffer, Melanie Crazier, MD  glucose blood (ONE TOUCH ULTRA TEST) test strip CHECK BLOOD  SUGAR 2 TIMES A DAY 06/18/17  Yes Stacks, Cletus Gash, MD  icosapent Ethyl (VASCEPA) 1 g capsule Take 2 capsules (2 g total) by mouth 2 (two) times daily. 06/26/19  Yes Stacks, Cletus Gash, MD  Lancets Misc. (ACCU-CHEK FASTCLIX LANCET) KIT Use to check Blood Sugars 04/27/17  Yes Claretta Fraise, MD  meclizine (ANTIVERT) 12.5 MG tablet Take 1 tablet (12.5 mg total) by mouth 3 (three) times daily as needed for dizziness. 09/30/18  Yes  Baruch Gouty, FNP  metoprolol tartrate (LOPRESSOR) 25 MG tablet TAKE  (1)  TABLET TWICE A DAY. 06/07/19  Yes Stacks, Cletus Gash, MD  olmesartan (BENICAR) 20 MG tablet TAKE 1 TABLET DAILY 06/07/19  Yes Stacks, Cletus Gash, MD  pregabalin (LYRICA) 50 MG capsule TAKE  (1)  CAPSULE  TWICE DAILY. 06/26/19  Yes Claretta Fraise, MD  RESTASIS 0.05 % ophthalmic emulsion  11/29/18  Yes [provider]  Semaglutide (RYBELSUS) 3 MG TABS Take 3 mg by mouth daily. 05/17/19  Yes Shamleffer, Melanie Crazier, MD  Vitamin D, Ergocalciferol, (DRISDOL) 1.25 MG (50000 UT) CAPS capsule Take 1 capsule (50,000 Units total) by mouth 2 (two) times a week. 08/08/18  Yes Stacks, Cletus Gash, MD  VOLTAREN 1 % GEL APPLY 4 GRAMS TO AFFECTED AREA(S) 4 TIMES A DAY AS NEEDED 11/29/18  Yes Stacks, Cletus Gash, MD  furosemide (LASIX) 20 MG tablet Take 1 tablet (20 mg total) by mouth daily. 08/08/18 11/06/18  Claretta Fraise, MD     Positive ROS: Otherwise negative  All other systems have been reviewed and were otherwise negative with the exception of those mentioned in the HPI and as above.  Physical Exam: Constitutional: Alert, well-appearing, no acute distress Ears: External ears without lesions or tenderness. Ear canals moderate amount of wax buildup in both ears that was cleaned with forceps and curettes.  Right side was worse than left.  With some deep wax adjacent to the right TM.  TMs were clear bilaterally.  Dix-Hallpike testing revealed no evidence of BPPV.  On tuning fork testing she had poor hearing in both ears.. Nasal:  External nose without lesions. Clear nasal passages Oral: Oropharynx clear. Neck: No palpable adenopathy or masses Respiratory: Breathing comfortably  Skin: No facial/neck lesions or rash noted.  Cerumen impaction removal  Date/Time: 08/03/2019 5:13 PM Performed by: Rozetta Nunnery, MD Authorized by: Rozetta Nunnery, MD   Consent:    Consent obtained:  Verbal   Consent given by:  Patient   Risks discussed:  Pain and bleeding Procedure details:    Location:  L ear and R ear   Procedure type: curette and forceps   Post-procedure details:    Inspection:  TM intact and canal normal   Hearing quality:  Improved   Patient tolerance of procedure:  Tolerated well, no immediate complications Comments:     TMs are clear bilaterally.    Assessment: Wax buildup. Chronic imbalance and dizziness.  No evidence of BPPV  Plan: I suspect she has poor vestibular function contributing to her imbalance.  Would recommend vestibular rehab.  Apparently there is a physical therapist in [place] close to where she lives.  She will call us with their number to see if they perform vestibular rehab.  Otherwise would [facility] to [facility].  Radene Journey, MD

## 2019-08-04 ENCOUNTER — Other Ambulatory Visit (INDEPENDENT_AMBULATORY_CARE_PROVIDER_SITE_OTHER): Payer: Self-pay

## 2019-08-04 DIAGNOSIS — R42 Dizziness and giddiness: Secondary | ICD-10-CM

## 2019-08-11 ENCOUNTER — Other Ambulatory Visit: Payer: Self-pay | Admitting: Internal Medicine

## 2019-08-11 ENCOUNTER — Other Ambulatory Visit: Payer: Self-pay | Admitting: Family Medicine

## 2019-08-11 DIAGNOSIS — E1159 Type 2 diabetes mellitus with other circulatory complications: Secondary | ICD-10-CM

## 2019-08-11 DIAGNOSIS — Z951 Presence of aortocoronary bypass graft: Secondary | ICD-10-CM

## 2019-08-11 DIAGNOSIS — I152 Hypertension secondary to endocrine disorders: Secondary | ICD-10-CM

## 2019-08-18 ENCOUNTER — Ambulatory Visit (HOSPITAL_COMMUNITY): Payer: Medicare Other | Attending: Otolaryngology | Admitting: Physical Therapy

## 2019-08-18 ENCOUNTER — Other Ambulatory Visit: Payer: Self-pay

## 2019-08-18 ENCOUNTER — Encounter (HOSPITAL_COMMUNITY): Payer: Self-pay | Admitting: Physical Therapy

## 2019-08-18 DIAGNOSIS — M6281 Muscle weakness (generalized): Secondary | ICD-10-CM | POA: Insufficient documentation

## 2019-08-18 DIAGNOSIS — R2689 Other abnormalities of gait and mobility: Secondary | ICD-10-CM | POA: Insufficient documentation

## 2019-08-18 NOTE — Therapy (Signed)
[place], [place], [address] Phone: [phone]   Fax:  [phone]  Physical Therapy Evaluation  Patient Details  Name: Carmen [name] MRN: [id number] Date of Birth: April 11, 1940 Referring Provider (PT): Melony Overly    Encounter Date: 08/18/2019  PT End of Session - 08/18/19 1710    Visit Number  1    Number of Visits  12    Date for PT Re-Evaluation  09/29/19    Authorization Type  [employer] MED[employer]    PT Start Time  [id number]    PT Stop Time  1615    PT Time Calculation (min)  40 min    Equipment Utilized During Treatment  Gait belt    Activity Tolerance  Patient tolerated treatment well    Behavior During Therapy  [facility] for tasks assessed/performed       Past Medical History:  Diagnosis Date  . Arthritis   . Asymptomatic PVCs   . Bilateral shoulder pain   . CAD (coronary artery disease)   . Cancer ([place]) 11/2015   melanomax4  right upper arm  . Chronic renal insufficiency, stage 3 (moderate)   . Diabetes mellitus without complication ([place])   . Diverticulosis   . Endometrial polyp   . External hemorrhoids   . Gout   . Hemochromatosis, hereditary ([place]) 11/24/2012  . History of anemia   . History of duodenal ulcer 1990  . Hyperlipidemia   . Hypertension   . Internal hemorrhoids   . Jaundice    age 79 or 33  . Myocardial infarction ([place]) 2007  . Neuromuscular disorder ([place])    peripheral neuropathy  . PMB (postmenopausal bleeding)   . PONV (postoperative nausea and vomiting)   . Prolapse of female pelvic organs    uses pessary  . Seizures ([place])    had one at Dr. Antonieta Pert office after getting blood drawn  . Tick bite 08/12/2017   had 3 bites  . Vitamin D deficiency     Past Surgical History:  Procedure Laterality Date  . BREAST SURGERY     left breast lump--benign  . CARDIAC CATHETERIZATION  06/03/2005  . COLONOSCOPY  11/28/2001  . CORONARY ARTERY BYPASS GRAFT  2007   x2 Dr. Roxan Hockey,  LIMA to LAD, SVG to PDA  . DILATATION & CURETTAGE/HYSTEROSCOPY WITH MYOSURE N/A 09/28/2017   Procedure: DILATATION & CURETTAGE/HYSTEROSCOPY WITH MYOSURE;  Surgeon: Molli Posey, MD;  Location: [facility];  Service: Gynecology;  Laterality: N/A;  . LIPOMA EXCISION     back  . UPPER GI ENDOSCOPY  01/20/1989    There were no vitals filed for this visit.   Subjective Assessment - 08/18/19 1544    Subjective  Pt states that she has been off balace for at least 8-9 months.  At home she will furniture walk and when she goes out she holds onto her daughters.  She has had a cane for a year but she has not been using it.   She has not fallen but has came close many times    Pertinent History  MI, CABG, HTN, DM, OA, osteoporoses    Limitations  Lifting;Standing;Walking;House hold activities    How long can you stand comfortably?  2 minutes max and then she has to hold onto something    How long can you walk comfortably?  Short distances only less than 3  minutes    Patient Stated Goals  PT wants to be able to  walk better and be more stable.    Currently in Pain?  No/denies         [facility] PT Assessment - 08/18/19 0001      Assessment   Medical Diagnosis  abnormality of gait with decreased balance     Referring Provider (PT)  Melony Overly     Onset Date/Surgical Date  12/22/18   PT has on and off episodes this episode    Prior Therapy  none      Precautions   Precautions  Fall      Restrictions   Weight Bearing Restrictions  No      Balance Screen   Has the patient fallen in the past 6 months  No    Has the patient had a decrease in activity level because of a fear of falling?   Yes      [place] residence      Prior Function   Level of Independence  Independent      Cognition   Overall Cognitive Status  Within Functional Limits for tasks assessed      Observation/Other Assessments   Focus on Therapeutic Outcomes  (FOTO)   36, 64% affected.       Functional Tests   Functional tests  Single leg stance;Sit to Stand      Single Leg Stance   Comments  Rt : 2; LT 2      Sit to Stand   Comments  7 in 30 seconds       ROM / Strength   AROM / PROM / Strength  Strength      Strength   Strength Assessment Site  Hip;Knee;Ankle    Right/Left Hip  Right;Left    Right Hip Flexion  3/5    Right Hip Extension  2-/5    Right Hip ABduction  3/5    Left Hip Flexion  3/5    Left Hip Extension  3+/5    Left Hip ABduction  3/5    Right/Left Knee  Right;Left    Right Knee Flexion  4+/5    Right Knee Extension  4/5    Left Knee Flexion  4+/5    Left Knee Extension  5/5    Right/Left Ankle  Right;Left    Right Ankle Dorsiflexion  5/5    Left Ankle Dorsiflexion  5/5      Ambulation/Gait   Ambulation Distance (Feet)  270 Feet    Assistive device  Straight cane    Gait Comments  2 minute walk test with cane one off balance notable unsteady       Standardized Balance Assessment   Standardized Balance Assessment  Timed Up and Go Test      Timed Up and Go Test   Normal TUG (seconds)  11.78    TUG Comments  iwth cane                   Objective measurements completed on examination: See above findings.      [facility] Adult PT Treatment/Exercise - 08/18/19 0001      Exercises   Exercises  Knee/Hip      Knee/Hip Exercises: Seated   Long Arc Quad  Both;10 reps    Sit to [employer]  10 reps      Knee/Hip Exercises: Supine   Bridges  5 reps    Straight Leg Raises  Both;5 reps      Knee/Hip Exercises: Sidelying  Hip ABduction  Both;5 reps               PT Short Term Goals - 08/18/19 1718      PT SHORT TERM GOAL #1   Title  PT LE strength to be incresed by 1/2 grade to allow pt to come sit to stand from a chair without using UE with ease.    Time  3    Period  Weeks    Status  New    Target Date  09/08/19      PT SHORT TERM GOAL #2   Title  Pt to be able to single leg stance on  both LE for 15 seconds to reduce risk of falling.    Time  3    Period  Weeks    Status  New      PT SHORT TERM GOAL #3   Title  PT to be able to walk 300 ft in a 2 minute period with the use of her cane to improve functional mobility    Time  2    Period  Weeks        PT Long Term Goals - 08/18/19 1720      PT LONG TERM GOAL #1   Title  Pt LE strength to improve one grade to be able to rise off of her couch without difficulty.    Time  6    Period  Weeks    Status  New    Target Date  09/29/19      PT LONG TERM GOAL #2   Title  Pt to have improved her foto score by 10 pt to demonstrate improved functional ability    Time  6    Period  Weeks    Status  New      PT LONG TERM GOAL #3   Title  Pt TUG to be less than 10 seconds for pt to feel confident getting up and answering the door.    Time  6    Period  Weeks    Status  New      PT LONG TERM GOAL #4   Title  PT to be I in walking with  a cane for 500 ft to improve I for short shopping trips    Time  6    Period  Weeks             Plan - 08/18/19 1711    Clinical Impression Statement  Carmen [name] is a 79 yo female who has been referred to physical therapy for balance disorder.  She does admitt to having vestibular sx, BPPV in particularly about 3-4 times a year but she is not experiencing this at this this time.  She states that since she has been experiencing the bouts of dizziness her balance and walking has been off and this is what is really concerning her at this time.  A Dix-Halpike manuever was completed B which was negative.  Carmen [name] was found to have decreased strength in both of her LE as well as decreased balance and will benefit from skilled PT to improve her functional ability and decrease her risk of falling.  Carmen [name] lives in [place] and would prefer to have therapy at that location therefore we will transfer the pt to [facility] outpatient.    Personal Factors and Comorbidities  Age;Comorbidity  2    Comorbidities  CABG, HTN, DM, OA, osteoporosis    Examination-Activity Limitations  Bend;Carry;Dressing;Hygiene/Grooming;Lift;Locomotion Level;Stairs;Stand    Examination-Participation Restrictions  Cleaning;Community Activity;Laundry;Shop;Meal Prep    Stability/Clinical Decision Making  Stable/Uncomplicated    Clinical Decision Making  Low    Rehab Potential  Good    PT Frequency  2x / week    PT Duration  6 weeks    PT Treatment/Interventions  Therapeutic activities;Therapeutic exercise;Balance training;Neuromuscular re-education;Patient/family education;Manual techniques    PT Next Visit Plan  Begin balance activity, heel/toe raises sit to stand, marching, tandem stance, tandem gait, side stepping with t-band progress to higher activites as pt allows.    PT Home Exercise Plan  LAQ,SLR< bridge, Hip abduction       Patient will benefit from skilled therapeutic intervention in order to improve the following deficits and impairments:  Abnormal gait, Decreased activity tolerance, Decreased balance, Decreased strength, Difficulty walking  Visit Diagnosis: Other abnormalities of gait and mobility - Plan: PT plan of care cert/re-cert  Muscle weakness (generalized) - Plan: PT plan of care cert/re-cert     Problem List Patient Active Problem List   Diagnosis Date Noted  . Type 2 diabetes mellitus with hyperglycemia, without long-term current use of insulin ([place]) 01/27/2019  . Type 2 diabetes mellitus with stage 3b chronic kidney disease, without long-term current use of insulin ([place]) 01/27/2019  . Chronic renal insufficiency, stage 3 (moderate) 09/10/2017  . Pre-operative clearance 09/10/2017  . Chronic pain of both shoulders 04/27/2017  . Vitamin D deficiency 01/28/2016  . Non-insulin treated type 2 diabetes mellitus ([place]) 11/12/2015  . Statin intolerance 08/18/2013  . Neuromuscular disorder ([place])   . Hemochromatosis, hereditary ([place]) 11/24/2012  . S/P CABG x 2 08/09/2012  .  PVC's (premature ventricular contractions) 08/09/2012  . Gout 06/20/2012  . Hypertension associated with diabetes ([place]) 06/20/2012  . HLD (hyperlipidemia) 06/20/2012  . Arthritis 06/20/2012    Rayetta Humphrey, PT CLT [phone] 08/18/2019, 5:26 PM  [place] [address] [place], [place], [address] Phone: [phone]   Fax:  [phone]  Name: JAQLYN MANSARAY MRN: [id number] Date of Birth: 03/14/1941

## 2019-08-23 ENCOUNTER — Encounter: Payer: Self-pay | Admitting: Internal Medicine

## 2019-08-23 ENCOUNTER — Ambulatory Visit (INDEPENDENT_AMBULATORY_CARE_PROVIDER_SITE_OTHER): Payer: Medicare Other | Admitting: Internal Medicine

## 2019-08-23 ENCOUNTER — Other Ambulatory Visit: Payer: Self-pay

## 2019-08-23 VITALS — BP 128/68 | HR 70 | Temp 98.6°F | Ht 64.0 in | Wt 193.2 lb

## 2019-08-23 DIAGNOSIS — E1165 Type 2 diabetes mellitus with hyperglycemia: Secondary | ICD-10-CM | POA: Diagnosis not present

## 2019-08-23 DIAGNOSIS — N1832 Chronic kidney disease, stage 3b: Secondary | ICD-10-CM | POA: Diagnosis not present

## 2019-08-23 DIAGNOSIS — E1122 Type 2 diabetes mellitus with diabetic chronic kidney disease: Secondary | ICD-10-CM

## 2019-08-23 DIAGNOSIS — E1121 Type 2 diabetes mellitus with diabetic nephropathy: Secondary | ICD-10-CM | POA: Diagnosis not present

## 2019-08-23 DIAGNOSIS — E119 Type 2 diabetes mellitus without complications: Secondary | ICD-10-CM | POA: Diagnosis not present

## 2019-08-23 DIAGNOSIS — E1142 Type 2 diabetes mellitus with diabetic polyneuropathy: Secondary | ICD-10-CM | POA: Diagnosis not present

## 2019-08-23 LAB — POCT GLYCOSYLATED HEMOGLOBIN (HGB A1C): Hemoglobin A1C: 7.3 % — AB (ref 4.0–5.6)

## 2019-08-23 MED ORDER — RYBELSUS 7 MG PO TABS: 7.0000 mg | ORAL_TABLET | Freq: Every day | ORAL | 6 refills | Status: DC

## 2019-08-23 NOTE — Progress Notes (Signed)
Name: Carmen [name]  Age/ Sex: 79 y.o., female   MRN/ DOB: [id number], 11-24-1940     PCP: Claretta Fraise, MD   Reason for Endocrinology Evaluation: Type 2 Diabetes Mellitus  Initial Endocrine Consultative Visit: 09/14/2018    PATIENT IDENTIFIER: Carmen [name] is a 79 y.o. female with a past medical history of HTN, PVC, neuromuscular disorder, hemochromatosis and CAD (S/P CABG) . The patient has followed with Endocrinology clinic since 09/14/2018 for consultative assistance with management of her diabetes.  DIABETIC HISTORY:  Carmen [name] was diagnosed with T2DM in 2016. Has tried oral glycemic agents in the past,Januvia- swelling , Trulicity - nausea , metformin - elevated LFT's . Her hemoglobin A1c has ranged from 6.6% in 2016, peaking at 8.6% in 2020.  Wilder Glade stopped 10/2018 due to vertigo   Rybelsus started 2/24  SUBJECTIVE:   During the last visit (05/17/2019): A1c was 7.9 %. We continued   Glipizide and started Rybelsus    Today (08/23/2019): Carmen [name] is here for a 3 month follow up on diabetes management.  She checks her blood sugars 2 times daily, preprandial to breakfast. The patient has not had hypoglycemic episodes since the last clinic visit. Otherwise, the patient has not required any recent emergency interventions for hypoglycemia and has not had recent hospitalizations secondary to hyper or hypoglycemic episodes.   She has new hearing aid, ear impaction was cleared, she is having imbalance issues, was evaluated by rehab and noted right leg weakness.   She is also c/o constipation , she feels fuller but no nausea or vomiting   HOME DIABETES REGIMEN:   Glipizide 10 mg 2 tabs before breakfast and 1 tablet before supper   Rybelsus 3 mg daily      GLUCOSE LOG:  157- 240  mg/dL    Eye exam 11/2018  HISTORY:  Past Medical History:  Past Medical History:  Diagnosis Date  . Arthritis   . Asymptomatic PVCs   . Bilateral shoulder pain   . CAD  (coronary artery disease)   . Cancer ([place]) 11/2015   melanomax4  right upper arm  . Chronic renal insufficiency, stage 3 (moderate)   . Diabetes mellitus without complication ([place])   . Diverticulosis   . Endometrial polyp   . External hemorrhoids   . Gout   . Hemochromatosis, hereditary ([place]) 11/24/2012  . History of anemia   . History of duodenal ulcer 1990  . Hyperlipidemia   . Hypertension   . Internal hemorrhoids   . Jaundice    age 50 or 74  . Myocardial infarction ([employer]) 2007  . Neuromuscular disorder ([place])    peripheral neuropathy  . PMB (postmenopausal bleeding)   . PONV (postoperative nausea and vomiting)   . Prolapse of female pelvic organs    uses pessary  . Seizures ([place])    had one at Dr. Antonieta Pert office after getting blood drawn  . Tick bite 08/12/2017   had 3 bites  . Vitamin D deficiency    Past Surgical History:  Past Surgical History:  Procedure Laterality Date  . BREAST SURGERY     left breast lump--benign  . CARDIAC CATHETERIZATION  06/03/2005  . COLONOSCOPY  11/28/2001  . CORONARY ARTERY BYPASS GRAFT  2007   x2 Dr. Roxan Hockey, LIMA to LAD, SVG to PDA  . DILATATION & CURETTAGE/HYSTEROSCOPY WITH MYOSURE N/A 09/28/2017   Procedure: DILATATION & CURETTAGE/HYSTEROSCOPY WITH MYOSURE;  Surgeon: Molli Posey, MD;  Location: [facility];  Service:  Gynecology;  Laterality: N/A;  . LIPOMA EXCISION     back  . UPPER GI ENDOSCOPY  01/20/1989    Social History:  reports that she has never smoked. She has never used smokeless tobacco. She reports current alcohol use. She reports that she does not use drugs. Family History:  Family History  Problem Relation Age of Onset  . Stroke Mother   . Hypertension Mother   . Neuropathy Mother   . Stroke Father   . Hypertension Father   . Diabetes Father   . Cancer Sister 73       sarcoma  . Diabetes Brother   . Arthritis Sister   . Arthritis Sister   . Lupus Sister   . Hemachromatosis Sister   .  Arthritis Sister   . Hemachromatosis Sister   . Hemachromatosis Sister   . Diverticulitis Sister   . Diabetes Brother   . Diabetes Brother   . Hemachromatosis Brother      HOME MEDICATIONS: Allergies as of 08/23/2019      Reactions   Cymbalta [duloxetine Hcl] Other (See Comments)   Profuse sweating   Januvia [sitagliptin] Swelling   Meloxicam Nausea And Vomiting   Trulicity [dulaglutide] Nausea Only   Diclofenac Diarrhea   Farxiga [dapagliflozin] Other (See Comments)   Dizziness   Shellfish Allergy Other (See Comments)   Gout   Tape    Causes skin irritation   Crestor [rosuvastatin] Other (See Comments)   Muscle aches on 5 mg daily and 10 mg twice weekly   Lipitor [atorvastatin] Other (See Comments)   Muscle aches   Livalo [pitavastatin] Other (See Comments)      Sulfa Antibiotics Rash   Zetia [ezetimibe] Other (See Comments)   Muscle aches   Zocor [simvastatin] Other (See Comments)   Muscle aches       Medication List       Accurate as of August 23, 2019  2:47 PM. If you have any questions, ask your nurse or doctor.        Accu-Chek [employer] Kit Use to check Blood Sugars   allopurinol 100 MG tablet Commonly known as: ZYLOPRIM TAKE  (1)  TABLET TWICE A DAY.   aspirin 81 MG tablet Take 81 mg by mouth daily.   colchicine 0.6 MG tablet TAKE (1) TABLET DAILY AS NEEDED.   fluticasone 50 MCG/ACT nasal spray Commonly known as: FLONASE SPRAY 1 SPRAY IN EACH NOSTRIL ONCE DAILY.   furosemide 20 MG tablet Commonly known as: LASIX TAKE 1 TABLET DAILY   glipiZIDE 10 MG tablet Commonly known as: GLUCOTROL TAKE 2 TABLETS BEFORE BREAKFAST AND 1 TABLET DAILY BEFORE SUPPER   glucose blood test strip Commonly known as: ONE TOUCH ULTRA TEST CHECK BLOOD SUGAR 2 TIMES A DAY   icosapent Ethyl 1 g capsule Commonly known as: Vascepa Take 2 capsules (2 g total) by mouth 2 (two) times daily.   meclizine 12.5 MG tablet Commonly known as: ANTIVERT Take 1 tablet  (12.5 mg total) by mouth 3 (three) times daily as needed for dizziness.   metoprolol tartrate 25 MG tablet Commonly known as: LOPRESSOR TAKE  (1)  TABLET TWICE A DAY.   olmesartan 20 MG tablet Commonly known as: BENICAR TAKE 1 TABLET DAILY   Praluent 150 MG/ML [place] Generic drug: Alirocumab Inject 1 Dose into the skin every 14 (fourteen) days.   pregabalin 50 MG capsule Commonly known as: LYRICA TAKE  (1)  CAPSULE  TWICE DAILY.   Restasis 0.05 %  ophthalmic emulsion Generic drug: cycloSPORINE   Rybelsus 7 MG Tabs Generic drug: Semaglutide Take 7 mg by mouth daily before breakfast. What changed:   medication strength  how much to take  when to take this Changed by: Dorita Sciara, MD   Vitamin D (Ergocalciferol) 1.25 MG (50000 UNIT) Caps capsule Commonly known as: DRISDOL Take 1 capsule (50,000 Units total) by mouth 2 (two) times a week.   Voltaren 1 % Gel Generic drug: diclofenac Sodium APPLY 4 GRAMS TO AFFECTED AREA(S) 4 TIMES A DAY AS NEEDED   diclofenac Sodium 1 % Gel Commonly known as: VOLTAREN APPLY 4 GRAMS TO AFFECTED AREA(S) 4 TIMES A DAY AS NEEDED        OBJECTIVE:   Vital Signs: BP 128/68 (BP Location: Left Arm, Patient Position: Sitting, Cuff Size: Large)   Pulse 70   Temp 98.6 F (37 C)   Ht 5' 4"  (1.626 m)   Wt 193 lb 3.2 oz (87.6 kg)   SpO2 97%   BMI 33.16 kg/m   Wt Readings from Last 3 Encounters:  08/23/19 193 lb 3.2 oz (87.6 kg)  06/27/19 188 lb (85.3 kg)  05/17/19 187 lb 9.6 oz (85.1 kg)     Exam: General: Pt appears well and is in NAD  Lungs: Clear with good BS bilat with no rales, rhonchi, or wheezes  Heart: RRR with normal S1 and S2 and no gallops; no murmurs; no rub  Extremities: No pretibial edema.  Skin: Normal texture and temperature to palpation.   Neuro: MS is good with appropriate affect, pt is alert and Ox3     DM foot exam: 01/26/2019  The skin of the feet is intact without sores or ulcerations. The pedal  pulses are 2+ on right and 2+ on left. The sensation is decreased to a screening 5.07, 10 gram monofilament bilaterally       DATA REVIEWED:  Lab Results  Component Value Date   HGBA1C 7.3 (A) 08/23/2019   HGBA1C 7.9 (A) 05/17/2019   HGBA1C 8.2 (A) 01/26/2019   Lab Results  Component Value Date   MICROALBUR <0.7 01/26/2019   LDLCALC 72 12/02/2018   CREATININE 1.29 (H) 06/27/2019    Lab Results  Component Value Date   CHOL 153 12/02/2018   HDL 37 (L) 12/02/2018   LDLCALC 72 12/02/2018   LDLDIRECT 75 12/02/2018   TRIG 271 (H) 12/02/2018   CHOLHDL 4.1 12/02/2018         ASSESSMENT / PLAN / RECOMMENDATIONS:   1) Type 2 Diabetes Mellitus, Sub-Optimally  controlled, With neuropathic and CKD III complications - Most recent A1c of 7.3 %. Goal A1c < 7.5 %.     - A1c continues to improve - She is tolerating Rybelsus without side effects, will increase as below   MEDICATIONS: - Continue Glipizide 10 mg , 2 tabs with Breakfast and 1 tablet with Supper  - Increase Rybelsus 7 mg daily with breakfast   EDUCATION / INSTRUCTIONS:  BG monitoring instructions: Patient is instructed to check her blood sugars 3 times a week fasting .  Call [facility] if: BG persistently < 70 or > 300. . I reviewed the Rule of 15 for the treatment of hypoglycemia in detail with the patient. Literature supplied.     F/U in 4 months    Signed electronically by: Mack Guise, MD  [facility] End[facility]inology  [facility] [address] [place], [place] [address] Phone: [phone] FAX: [phone]  CC: Claretta Fraise, MD [place] [address] Phone: [phone]  Fax: [phone]  Return to Endocrinology clinic as below: Future Appointments  Date Time Provider [place]  08/24/2019  1:45 PM [place], [place], [place] [facility]  08/28/2019  1:00 PM Standley Brooking, PTA [facility]  08/29/2019  1:00 PM [facility] SOCIAL WORK [facility] None  08/31/2019  1:00 PM Mang[place]ang, [place], PT [facility]  11/16/2019  2:30 PM [facility]-ANNUAL WELLNESS VISIT [facility] None  12/27/2019 11:30 AM [facility] LAB [facility] None  12/27/2019 12:00 PM Ennever, Rudell Cobb, MD [facility] None  12/29/2019  1:00 PM Kolyn Rozario, Melanie Crazier, MD [facility] None

## 2019-08-23 NOTE — Patient Instructions (Signed)
-   Glipizide 10 mg, 2 tablets before breakfast and 1 tablet before supper   - Increase Rybelsus 7 mg daily with Breakfast    - HOW TO TREAT LOW BLOOD SUGARS (Blood sugar LESS THAN 70 MG/DL)  Please follow the RULE OF 15 for the treatment of hypoglycemia treatment (when your (blood sugars are less than 70 mg/dL)    STEP 1: Take 15 grams of carbohydrates when your blood sugar is low, which includes:   3-4 GLUCOSE TABS  OR  3-4 OZ OF JUICE OR REGULAR SODA OR  ONE TUBE OF GLUCOSE GEL     STEP 2: RECHECK blood sugar in 15 MINUTES STEP 3: If your blood sugar is still low at the 15 minute recheck --> then, go back to STEP 1 and treat AGAIN with another 15 grams of carbohydrates.

## 2019-08-24 ENCOUNTER — Ambulatory Visit: Payer: Medicare Other | Attending: Otolaryngology | Admitting: Physical Therapy

## 2019-08-24 ENCOUNTER — Other Ambulatory Visit: Payer: Self-pay

## 2019-08-24 ENCOUNTER — Encounter: Payer: Self-pay | Admitting: Physical Therapy

## 2019-08-24 DIAGNOSIS — M6281 Muscle weakness (generalized): Secondary | ICD-10-CM | POA: Insufficient documentation

## 2019-08-24 DIAGNOSIS — R2689 Other abnormalities of gait and mobility: Secondary | ICD-10-CM | POA: Insufficient documentation

## 2019-08-24 NOTE — Therapy (Signed)
[facility] [place], [place], [address] Phone: [phone]   Fax:  [phone]  Physical Therapy Treatment  Patient Details  Name: Carmen [name] MRN: [id number] Date of Birth: Oct 26, 1940 Referring Provider (PT): Melony Overly    Encounter Date: 08/24/2019  PT End of Session - 08/24/19 1355    Visit Number  2    Number of Visits  12    Date for PT Re-Evaluation  09/29/19    Authorization Type  [employer] MED[employer]; Progress note every 10th visit; KX modifier at 15th visit    PT Start Time  1345    PT Stop Time  1428    PT Time Calculation (min)  43 min    Activity Tolerance  Patient tolerated treatment well    Behavior During Therapy  [facility] for tasks assessed/performed       Past Medical History:  Diagnosis Date  . Arthritis   . Asymptomatic PVCs   . Bilateral shoulder pain   . CAD (coronary artery disease)   . Cancer ([place]) 11/2015   melanomax4  right upper arm  . Chronic renal insufficiency, stage 3 (moderate)   . Diabetes mellitus without complication ([place])   . Diverticulosis   . Endometrial polyp   . External hemorrhoids   . Gout   . Hemochromatosis, hereditary ([place]) 11/24/2012  . History of anemia   . History of duodenal ulcer 1990  . Hyperlipidemia   . Hypertension   . Internal hemorrhoids   . Jaundice    age 2 or 66  . Myocardial infarction ([place]) 2007  . Neuromuscular disorder ([place])    peripheral neuropathy  . PMB (postmenopausal bleeding)   . PONV (postoperative nausea and vomiting)   . Prolapse of female pelvic organs    uses pessary  . Seizures ([place])    had one at Dr. Antonieta Pert office after getting blood drawn  . Tick bite 08/12/2017   had 3 bites  . Vitamin D deficiency     Past Surgical History:  Procedure Laterality Date  . BREAST SURGERY     left breast lump--benign  . CARDIAC CATHETERIZATION  06/03/2005  . COLONOSCOPY  11/28/2001  . CORONARY ARTERY BYPASS GRAFT  2007   x2 Dr.  Roxan Hockey, LIMA to LAD, SVG to PDA  . DILATATION & CURETTAGE/HYSTEROSCOPY WITH MYOSURE N/A 09/28/2017   Procedure: DILATATION & CURETTAGE/HYSTEROSCOPY WITH MYOSURE;  Surgeon: Molli Posey, MD;  Location: [facility];  Service: Gynecology;  Laterality: N/A;  . LIPOMA EXCISION     back  . UPPER GI ENDOSCOPY  01/20/1989    There were no vitals filed for this visit.  Subjective Assessment - 08/24/19 1353    Subjective  COVID-19 screening performed upon arrival. Patient reports some near falls but no falls. Still not used to walking with a cane    Pertinent History  MI, CABG, HTN, DM, OA, Osteoporosis    Limitations  Lifting;Standing;Walking;House hold activities    How long can you stand comfortably?  2 minutes max and then she has to hold onto something    How long can you walk comfortably?  Short distances only less than 3  minutes    Patient Stated Goals  PT wants to be able to walk better and be more stable.         [facility] PT Assessment - 08/24/19 0001      Assessment   Medical Diagnosis  abnormality of gait with decreased balance  Referring Provider (PT)  Melony Overly     Onset Date/Surgical Date  12/22/18    Hand Dominance  Right    Prior Therapy  none      Precautions   Precautions  Fall                    [facility] Adult PT Treatment/Exercise - 08/24/19 0001      Knee/Hip Exercises: Aerobic   Nustep  Level 3 x10 mins with UEs      Knee/Hip Exercises: Standing   Knee Flexion  AROM;Both;10 reps    Hip Abduction  AROM;Both;10 reps;Knee straight    Rocker Board  3 minutes      Knee/Hip Exercises: Seated   Long Arc Quad  Both;10 reps    Ball Squeeze  3" hold x20    Clamshell with TheraBand  Yellow   x20   Hamstring Curl  Strengthening;Both;2 sets;10 reps    Hamstring Limitations  yellow theraband          Balance Exercises - 08/24/19 1357      Balance Exercises: Standing   Standing Eyes Opened  Narrow base of support  (BOS);5 reps;30 secs    Sidestepping  Other reps (comment);Other (comment)   x2 minutes   Marching  Solid surface;Dynamic;20 reps   step tapping on 6" step   Heel Raises  Both;20 reps    Toe Raise  Both;20 reps          PT Short Term Goals - 08/18/19 1718      PT SHORT TERM GOAL #1   Title  PT LE strength to be incresed by 1/2 grade to allow pt to come sit to stand from a chair without using UE with ease.    Time  3    Period  Weeks    Status  New    Target Date  09/08/19      PT SHORT TERM GOAL #2   Title  Pt to be able to single leg stance on both LE for 15 seconds to reduce risk of falling.    Time  3    Period  Weeks    Status  New      PT SHORT TERM GOAL #3   Title  PT to be able to walk 300 ft in a 2 minute period with the use of her cane to improve functional mobility    Time  2    Period  Weeks        PT Long Term Goals - 08/18/19 1720      PT LONG TERM GOAL #1   Title  Pt LE strength to improve one grade to be able to rise off of her couch without difficulty.    Time  6    Period  Weeks    Status  New    Target Date  09/29/19      PT LONG TERM GOAL #2   Title  Pt to have improved her foto score by 10 pt to demonstrate improved functional ability    Time  6    Period  Weeks    Status  New      PT LONG TERM GOAL #3   Title  Pt TUG to be less than 10 seconds for pt to feel confident getting up and answering the door.    Time  6    Period  Weeks    Status  New  PT LONG TERM GOAL #4   Title  PT to be I in walking with  a cane for 500 ft to improve I for short shopping trips    Time  6    Period  Weeks            Plan - 08/24/19 1444    Clinical Impression Statement  Patient arrives feeling good but not confident with walking with a SPC. Patient responded well to exercise and balance activities. Patient required contact guard for balance activities but with good use of ankle strategies. Patient instructed to continue HEP to maximize PT  benefit. Patient reported understanding.    Personal Factors and Comorbidities  Age;Comorbidity 2    Comorbidities  CABG, HTN, DM, OA, osteoporosis    Examination-Activity Limitations  Bend;Carry;Dressing;Hygiene/Grooming;Lift;Locomotion Level;Stairs;Stand    Examination-Participation Restrictions  Cleaning;Community Activity;Laundry;Shop;Meal Prep    Stability/Clinical Decision Making  Stable/Uncomplicated    Clinical Decision Making  Low    Rehab Potential  Good    PT Frequency  2x / week    PT Duration  6 weeks    PT Treatment/Interventions  Therapeutic activities;Therapeutic exercise;Balance training;Neuromuscular re-education;Patient/family education;Manual techniques    PT Next Visit Plan  Begin balance activity, heel/toe raises sit to stand, marching, tandem stance, tandem gait, side stepping with t-band progress to higher activites as pt allows.    PT Home Exercise Plan  LAQ,SLR< bridge, Hip abduction    Consulted and Agree with Plan of Care  Patient       Patient will benefit from skilled therapeutic intervention in order to improve the following deficits and impairments:  Abnormal gait, Decreased activity tolerance, Decreased balance, Decreased strength, Difficulty walking  Visit Diagnosis: Other abnormalities of gait and mobility  Muscle weakness (generalized)     Problem List Patient Active Problem List   Diagnosis Date Noted  . Type 2 diabetes mellitus with hyperglycemia, without long-term current use of insulin ([place]) 01/27/2019  . Type 2 diabetes mellitus with stage 3b chronic kidney disease, without long-term current use of insulin ([place]) 01/27/2019  . Chronic renal insufficiency, stage 3 (moderate) 09/10/2017  . Pre-operative clearance 09/10/2017  . Chronic pain of both shoulders 04/27/2017  . Vitamin D deficiency 01/28/2016  . Non-insulin treated type 2 diabetes mellitus ([place]) 11/12/2015  . Statin intolerance 08/18/2013  . Neuromuscular disorder ([place])   .  Hemochromatosis, hereditary ([place]) 11/24/2012  . S/P CABG x 2 08/09/2012  . PVC's (premature ventricular contractions) 08/09/2012  . Gout 06/20/2012  . Hypertension associated with diabetes ([place]) 06/20/2012  . HLD (hyperlipidemia) 06/20/2012  . Arthritis 06/20/2012    Gabriela Eves, PT, DPT 08/24/2019, 3:11 PM  [facility] [address] [place], [place], [address] Phone: [phone]   Fax:  [phone]  Name: Carmen [name] MRN: [id number] Date of Birth: [birth year]

## 2019-08-28 ENCOUNTER — Ambulatory Visit: Payer: Medicare Other | Admitting: Physical Therapy

## 2019-08-28 ENCOUNTER — Encounter: Payer: Self-pay | Admitting: Physical Therapy

## 2019-08-28 ENCOUNTER — Other Ambulatory Visit: Payer: Self-pay

## 2019-08-28 DIAGNOSIS — M6281 Muscle weakness (generalized): Secondary | ICD-10-CM

## 2019-08-28 DIAGNOSIS — R2689 Other abnormalities of gait and mobility: Secondary | ICD-10-CM | POA: Diagnosis not present

## 2019-08-28 NOTE — Therapy (Signed)
[facility] [place], [place], [address] Phone: [phone]   Fax:  [phone]  Physical Therapy Treatment  Patient Details  Name: Carmen [name] MRN: [id number] Date of Birth: 1941/01/12 Referring Provider (PT): Melony Overly    Encounter Date: 08/28/2019  PT End of Session - 08/28/19 1305    Visit Number  3    Number of Visits  12    Date for PT Re-Evaluation  09/29/19    Authorization Type  [employer] MEDI[employer]; Progress note every 10th visit; KX modifier at 15th visit    PT Start Time  1302    PT Stop Time  1344    PT Time Calculation (min)  42 min    Equipment Utilized During Treatment  Other (comment)   SPC   Activity Tolerance  Patient tolerated treatment well    Behavior During Therapy  [facility] for tasks assessed/performed       Past Medical History:  Diagnosis Date  . Arthritis   . Asymptomatic PVCs   . Bilateral shoulder pain   . CAD (coronary artery disease)   . Cancer ([place]) 11/2015   melanomax4  right upper arm  . Chronic renal insufficiency, stage 3 (moderate)   . Diabetes mellitus without complication ([place])   . Diverticulosis   . Endometrial polyp   . External hemorrhoids   . Gout   . Hemochromatosis, hereditary ([place]) 11/24/2012  . History of anemia   . History of duodenal ulcer 1990  . Hyperlipidemia   . Hypertension   . Internal hemorrhoids   . Jaundice    age 79 or 82  . Myocardial infarction ([place]) 2007  . Neuromuscular disorder ([place])    peripheral neuropathy  . PMB (postmenopausal bleeding)   . PONV (postoperative nausea and vomiting)   . Prolapse of female pelvic organs    uses pessary  . Seizures ([place])    had one at Dr. Antonieta Pert office after getting blood drawn  . Tick bite 08/12/2017   had 3 bites  . Vitamin D deficiency     Past Surgical History:  Procedure Laterality Date  . BREAST SURGERY     left breast lump--benign  . CARDIAC CATHETERIZATION  06/03/2005  . COLONOSCOPY   11/28/2001  . CORONARY ARTERY BYPASS GRAFT  2007   x2 Dr. Roxan Hockey, LIMA to LAD, SVG to PDA  . DILATATION & CURETTAGE/HYSTEROSCOPY WITH MYOSURE N/A 09/28/2017   Procedure: DILATATION & CURETTAGE/HYSTEROSCOPY WITH MYOSURE;  Surgeon: Molli Posey, MD;  Location: [facility];  Service: Gynecology;  Laterality: N/A;  . LIPOMA EXCISION     back  . UPPER GI ENDOSCOPY  01/20/1989    There were no vitals filed for this visit.  Subjective Assessment - 08/28/19 1304    Subjective  COVID-19 screening performed upon arrival. Patient reports she has had no new falls but tends to lean to the right which is weaker.    Pertinent History  MI, CABG, HTN, DM, OA, Osteoporosis    Limitations  Lifting;Standing;Walking;House hold activities    How long can you stand comfortably?  2 minutes max and then she has to hold onto something    How long can you walk comfortably?  Short distances only less than 3  minutes    Patient Stated Goals  PT wants to be able to walk better and be more stable.    Currently in Pain?  No/denies         [facility] PT Assessment -  08/28/19 0001      Assessment   Medical Diagnosis  abnormality of gait with decreased balance     Referring Provider (PT)  Melony Overly     Onset Date/Surgical Date  12/22/18    Hand Dominance  Right    Prior Therapy  none      Precautions   Precautions  Fall                    [facility] Adult PT Treatment/Exercise - 08/28/19 0001      Knee/Hip Exercises: Aerobic   Nustep  L4 x15 min      Knee/Hip Exercises: Standing   Heel Raises  Both;2 sets;10 reps    Heel Raises Limitations  B toe raise x20 reps    Hip Flexion  AROM;Both;2 sets;10 reps;Knee bent    Hip Abduction  AROM;Both;2 sets;10 reps;Knee straight    Functional Squat  20 reps      Knee/Hip Exercises: Seated   Long Arc Quad  Strengthening;Right;2 sets;10 reps;Weights    Long Arc Quad Weight  4 lbs.    Ball Squeeze  3" hold x20    Clamshell with  TheraBand  Green   x20 reps   Hamstring Curl  Strengthening;Right;2 sets;10 reps    Hamstring Limitations  green theraband    Sit to Sand  10 reps;without UE support   elevated surface         Balance Exercises - 08/28/19 0001      Balance Exercises: Standing   Standing Eyes Opened  Narrow base of support (BOS);Foam/compliant surface   x3 min on horizontal beam         PT Short Term Goals - 08/18/19 1718      PT SHORT TERM GOAL #1   Title  PT LE strength to be incresed by 1/2 grade to allow pt to come sit to stand from a chair without using UE with ease.    Time  3    Period  Weeks    Status  New    Target Date  09/08/19      PT SHORT TERM GOAL #2   Title  Pt to be able to single leg stance on both LE for 15 seconds to reduce risk of falling.    Time  3    Period  Weeks    Status  New      PT SHORT TERM GOAL #3   Title  PT to be able to walk 300 ft in a 2 minute period with the use of her cane to improve functional mobility    Time  2    Period  Weeks        PT Long Term Goals - 08/18/19 1720      PT LONG TERM GOAL #1   Title  Pt LE strength to improve one grade to be able to rise off of her couch without difficulty.    Time  6    Period  Weeks    Status  New    Target Date  09/29/19      PT LONG TERM GOAL #2   Title  Pt to have improved her foto score by 10 pt to demonstrate improved functional ability    Time  6    Period  Weeks    Status  New      PT LONG TERM GOAL #3   Title  Pt TUG to be less than 10 seconds for  pt to feel confident getting up and answering the door.    Time  6    Period  Weeks    Status  New      PT LONG TERM GOAL #4   Title  PT to be I in walking with  a cane for 500 ft to improve I for short shopping trips    Time  6    Period  Weeks            Plan - 08/28/19 1348    Clinical Impression Statement  Patient presented in clinic with no recent falls or pain. Patient does use SPC but also reports furniture walking at  home. Patient lives by herself and was isolated during pandemic. Patient able to tolerate therex well with moderate multimodal cueing to promote proper exercise technique. Patient is very fearful of falling especially in her bathroom.    Personal Factors and Comorbidities  Age;Comorbidity 2    Comorbidities  CABG, HTN, DM, OA, osteoporosis    Examination-Activity Limitations  Bend;Carry;Dressing;Hygiene/Grooming;Lift;Locomotion Level;Stairs;Stand    Examination-Participation Restrictions  Cleaning;Community Activity;Laundry;Shop;Meal Prep    Stability/Clinical Decision Making  Stable/Uncomplicated    Rehab Potential  Good    PT Frequency  2x / week    PT Duration  6 weeks    PT Treatment/Interventions  Therapeutic activities;Therapeutic exercise;Balance training;Neuromuscular re-education;Patient/family education;Manual techniques    PT Next Visit Plan  Begin balance activity, heel/toe raises sit to stand, marching, tandem stance, tandem gait, side stepping with t-band progress to higher activites as pt allows.    PT Home Exercise Plan  LAQ,SLR< bridge, Hip abduction    Consulted and Agree with Plan of Care  Patient       Patient will benefit from skilled therapeutic intervention in order to improve the following deficits and impairments:  Abnormal gait, Decreased activity tolerance, Decreased balance, Decreased strength, Difficulty walking  Visit Diagnosis: Other abnormalities of gait and mobility  Muscle weakness (generalized)     Problem List Patient Active Problem List   Diagnosis Date Noted  . Type 2 diabetes mellitus with hyperglycemia, without long-term current use of insulin ([place]) 01/27/2019  . Type 2 diabetes mellitus with stage 3b chronic kidney disease, without long-term current use of insulin ([place]) 01/27/2019  . Chronic renal insufficiency, stage 3 (moderate) 09/10/2017  . Pre-operative clearance 09/10/2017  . Chronic pain of both shoulders 04/27/2017  . Vitamin D  deficiency 01/28/2016  . Non-insulin treated type 2 diabetes mellitus ([place]) 11/12/2015  . Statin intolerance 08/18/2013  . Neuromuscular disorder ([place])   . Hemochromatosis, hereditary ([place]) 11/24/2012  . S/P CABG x 2 08/09/2012  . PVC's (premature ventricular contractions) 08/09/2012  . Gout 06/20/2012  . Hypertension associated with diabetes ([place]) 06/20/2012  . HLD (hyperlipidemia) 06/20/2012  . Arthritis 06/20/2012    Standley Brooking, PTA 08/28/2019, 2:01 PM  [facility] [address] [place], [place], [address] Phone: [phone]   Fax:  [phone]  Name: Carmen [name] MRN: [id number] Date of Birth: 10/23/1940

## 2019-08-29 ENCOUNTER — Ambulatory Visit (INDEPENDENT_AMBULATORY_CARE_PROVIDER_SITE_OTHER): Payer: Medicare Other | Admitting: Licensed Clinical Social Worker

## 2019-08-29 DIAGNOSIS — E1159 Type 2 diabetes mellitus with other circulatory complications: Secondary | ICD-10-CM

## 2019-08-29 DIAGNOSIS — Z951 Presence of aortocoronary bypass graft: Secondary | ICD-10-CM

## 2019-08-29 DIAGNOSIS — I10 Essential (primary) hypertension: Secondary | ICD-10-CM | POA: Diagnosis not present

## 2019-08-29 DIAGNOSIS — N183 Chronic kidney disease, stage 3 unspecified: Secondary | ICD-10-CM

## 2019-08-29 DIAGNOSIS — E782 Mixed hyperlipidemia: Secondary | ICD-10-CM | POA: Diagnosis not present

## 2019-08-29 DIAGNOSIS — M1990 Unspecified osteoarthritis, unspecified site: Secondary | ICD-10-CM

## 2019-08-29 DIAGNOSIS — E1165 Type 2 diabetes mellitus with hyperglycemia: Secondary | ICD-10-CM | POA: Diagnosis not present

## 2019-08-29 DIAGNOSIS — E559 Vitamin D deficiency, unspecified: Secondary | ICD-10-CM

## 2019-08-29 DIAGNOSIS — I152 Hypertension secondary to endocrine disorders: Secondary | ICD-10-CM

## 2019-08-29 NOTE — Patient Instructions (Addendum)
Licensed Clinical Social Worker Visit Information  Goals we discussed today:  Goals Addressed            This Visit's Progress   . Client will call LCSW in next 30 days to talk about her management of medical condtions and about her completion of ADLs (pt-stated)       CARE PLAN ENTRY   Current Barriers:  . Vertigo issues in client with chronic diagnoses of HTN, HLD, Arthritis, S/P CABG X 2, Viatmin D Deficiency, CKD . Mobility issues  Clinical Social Work Clinical Goal(s):  Marland Kitchen LCSW to call client in next 30 days to talk with her about management of her medical condition and her completion of daily ADLs  Interventions: . Talked with client about CCM program support . Talked with client about RNCM support with CCM program . Talked with client about pain issues of client . Talked with client about social support network of client (has  support from daughter) . Talked with client about physical therapy sessions of client  . Talked with client about ambulation needs of client (uses a cane to help her walk) . Talked with client about DME of client (has a tub seat, hand held shower, grab bars, low step shower) . Talked with client about vision challenges of client  . Talked with client about her recent visit with endocrinologist . Talked with client about relaxation activities (likes to read and sit on front porch, likes to work with flowers/roses) . Talked with client about her upcoming client appointments  . Talked with client about her meals provision  Patient Self Care Activities:  Does ADLs independently Attends scheduled medical appointments Drives to appointments and drives to complete errands  Patient Self Care Deficits:  . Balance issues . Ambulation issues . Vertigo issues  Initial goal documentation       Materials Provided: No  Follow Up Plan: LCSW to call client in next 4 weeks to talk with client about her management of medical issues and about her completion of  ADLs  The patient verbalized understanding of instructions provided today and declined a print copy of patient instruction materials.   Norva Riffle.Rajon Bisig MSW, LCSW Licensed Clinical Social Worker [facility] [phone]

## 2019-08-29 NOTE — Chronic Care Management (AMB) (Signed)
Chronic Care Management    Clinical Social Work Follow Up Note  08/29/2019 Name: Carmen [name] MRN: [id number] DOB: 1941-03-06  Carmen [name] is a 79 y.o. year old female who is a primary care patient of Stacks, Cletus Gash, MD. The CCM team was consulted for assistance with [employer] .   Review of patient status, including review of consultants reports, other relevant assessments, and collaboration with appropriate care team members and the patient's provider was performed as part of comprehensive patient evaluation and provision of chronic care management services.    SDOH (Social Determinants of Health) assessments performed: Yes;risk for social isolation; risk of physical inactivity  Depression screen [facility] 2/9 08/29/2019 11/10/2018 10/27/2018 08/08/2018 05/13/2018  Decreased Interest 1 0 0 0 0  Down, Depressed, Hopeless 1 0 0 0 0  PHQ - 2 Score 2 0 0 0 0  Altered sleeping 0 - - - -  Tired, decreased energy 1 - - - -  Change in appetite 0 - - - -  Feeling bad or failure about yourself  0 - - - -  Trouble concentrating 0 - - - -  Moving slowly or fidgety/restless 1 - - - -  Suicidal thoughts 0 - - - -  PHQ-9 Score 4 - - - -  Difficult doing work/chores Somewhat difficult - - - -  Some recent data might be hidden   GAD 7 : Generalized Anxiety Score 08/29/2019  Nervous, Anxious, on Edge 0  Control/stop worrying 1  Worry too much - different things 0  Trouble relaxing 1  Restless 0  Easily annoyed or irritable 0  Afraid - awful might happen 0  Total GAD 7 Score 2  Anxiety Difficulty Somewhat difficult    Outpatient Encounter Medications as of 08/29/2019  Medication Sig   Alirocumab (PRALUENT) 150 MG/ML SOAJ Inject 1 Dose into the skin every 14 (fourteen) days.   allopurinol (ZYLOPRIM) 100 MG tablet TAKE  (1)  TABLET TWICE A DAY.   aspirin 81 MG tablet Take 81 mg by mouth daily.   colchicine 0.6 MG tablet TAKE (1) TABLET DAILY AS NEEDED.   diclofenac Sodium  (VOLTAREN) 1 % GEL APPLY 4 GRAMS TO AFFECTED AREA(S) 4 TIMES A DAY AS NEEDED   fluticasone (FLONASE) 50 MCG/ACT nasal spray SPRAY 1 SPRAY IN EACH NOSTRIL ONCE DAILY.   furosemide (LASIX) 20 MG tablet TAKE 1 TABLET DAILY   glipiZIDE (GLUCOTROL) 10 MG tablet TAKE 2 TABLETS BEFORE BREAKFAST AND 1 TABLET DAILY BEFORE SUPPER   glucose blood (ONE TOUCH ULTRA TEST) test strip CHECK BLOOD SUGAR 2 TIMES A DAY   icosapent Ethyl (VASCEPA) 1 g capsule Take 2 capsules (2 g total) by mouth 2 (two) times daily.   Lancets Misc. (ACCU-CHEK FASTCLIX LANCET) KIT Use to check Blood Sugars   meclizine (ANTIVERT) 12.5 MG tablet Take 1 tablet (12.5 mg total) by mouth 3 (three) times daily as needed for dizziness.   metoprolol tartrate (LOPRESSOR) 25 MG tablet TAKE  (1)  TABLET TWICE A DAY.   olmesartan (BENICAR) 20 MG tablet TAKE 1 TABLET DAILY   pregabalin (LYRICA) 50 MG capsule TAKE  (1)  CAPSULE  TWICE DAILY.   RESTASIS 0.05 % ophthalmic emulsion    Semaglutide (RYBELSUS) 7 MG TABS Take 7 mg by mouth daily before breakfast.   Vitamin D, Ergocalciferol, (DRISDOL) 1.25 MG (50000 UT) CAPS capsule Take 1 capsule (50,000 Units total) by mouth 2 (two) times a week.   VOLTAREN 1 % GEL  APPLY 4 GRAMS TO AFFECTED AREA(S) 4 TIMES A DAY AS NEEDED   No facility-administered encounter medications on file as of 08/29/2019.    Goals Addressed            This Visit's Progress    Client will call LCSW in next 30 days to talk about her management of medical condtions and about her completion of ADLs (pt-stated)       CARE PLAN ENTRY   Current Barriers:   Vertigo issues in client with chronic diagnoses of HTN, HLD, Arthritis, S/P CABG X 2, Viatmin D Deficiency, CKD  Mobility issues  Clinical Social Work Clinical Goal(s):   LCSW to call client in next 30 days to talk with her about management of her medical condition and her completion of daily ADLs  Interventions:  Talked with client about CCM program  support  Talked with client about RNCM support with CCM program  Talked with client about pain issues of client  Talked with client about social support network of client (has  support from daughter)  Talked with client about physical therapy sessions of client   Talked with client about ambulation needs of client (uses a cane to help her walk)  Talked with client about DME of client (has a tub seat, hand held shower, grab bars, low step shower)  Talked with client about vision challenges of client   Talked with client about her recent visit with endocrinologist  Talked with client about relaxation activities (likes to read and sit on front porch, likes to work with flowers/roses)  Talked with client about her upcoming client appointments   Talked with client about her meals provision   Patient Self Care Activities:  Does ADLs independently Attends scheduled medical appointments Drives to appointments and drives to complete errands  Patient Self Care Deficits:   Balance issues  Ambulation issues  Vertigo issues  Initial goal documentation       Follow Up Plan: LCSW to call client in next 4 weeks to talk with client about her management of medical issues and about her completion of ADLs  Norva Riffle.Lorenna Lurry MSW, LCSW Licensed Clinical Social Worker [facility] [phone]

## 2019-08-31 ENCOUNTER — Other Ambulatory Visit: Payer: Self-pay

## 2019-08-31 ENCOUNTER — Ambulatory Visit: Payer: Medicare Other | Admitting: Physical Therapy

## 2019-08-31 ENCOUNTER — Encounter: Payer: Self-pay | Admitting: Physical Therapy

## 2019-08-31 DIAGNOSIS — R2689 Other abnormalities of gait and mobility: Secondary | ICD-10-CM | POA: Diagnosis not present

## 2019-08-31 DIAGNOSIS — M6281 Muscle weakness (generalized): Secondary | ICD-10-CM

## 2019-08-31 NOTE — Therapy (Signed)
[facility] [place], [place], [address] Phone: [phone]   Fax:  [phone]  Physical Therapy Treatment  Patient Details  Name: Carmen [name] MRN: [id number] Date of Birth: Dec 02, 1940 Referring Provider (PT): Melony Overly    Encounter Date: 08/31/2019   PT End of Session - 08/31/19 1306    Visit Number 4    Number of Visits 12    Date for PT Re-Evaluation 09/29/19    Authorization Type [employer]; Progress note every 10th visit; KX modifier at 15th visit    PT Start Time 0100    PT Stop Time 0144    PT Time Calculation (min) 44 min    Activity Tolerance Patient tolerated treatment well    Behavior During Therapy [facility] for tasks assessed/performed           Past Medical History:  Diagnosis Date  . Arthritis   . Asymptomatic PVCs   . Bilateral shoulder pain   . CAD (coronary artery disease)   . Cancer ([place]) 11/2015   melanomax4  right upper arm  . Chronic renal insufficiency, stage 3 (moderate)   . Diabetes mellitus without complication ([place])   . Diverticulosis   . Endometrial polyp   . External hemorrhoids   . Gout   . Hemochromatosis, hereditary ([place]) 11/24/2012  . History of anemia   . History of duodenal ulcer 1990  . Hyperlipidemia   . Hypertension   . Internal hemorrhoids   . Jaundice    age 28 or 57  . Myocardial infarction ([place]) 2007  . Neuromuscular disorder ([place])    peripheral neuropathy  . PMB (postmenopausal bleeding)   . PONV (postoperative nausea and vomiting)   . Prolapse of female pelvic organs    uses pessary  . Seizures ([place])    had one at Dr. Antonieta Pert office after getting blood drawn  . Tick bite 08/12/2017   had 3 bites  . Vitamin D deficiency     Past Surgical History:  Procedure Laterality Date  . BREAST SURGERY     left breast lump--benign  . CARDIAC CATHETERIZATION  06/03/2005  . COLONOSCOPY  11/28/2001  . CORONARY ARTERY BYPASS GRAFT  2007   x2 Dr.  Roxan Hockey, LIMA to LAD, SVG to PDA  . DILATATION & CURETTAGE/HYSTEROSCOPY WITH MYOSURE N/A 09/28/2017   Procedure: DILATATION & CURETTAGE/HYSTEROSCOPY WITH MYOSURE;  Surgeon: Molli Posey, MD;  Location: [facility];  Service: Gynecology;  Laterality: N/A;  . LIPOMA EXCISION     back  . UPPER GI ENDOSCOPY  01/20/1989    There were no vitals filed for this visit.   Subjective Assessment - 08/31/19 1303    Subjective COVID-19 screening performed upon arrival. Patient arrived and reported she did good after last treatment and feels stronger    Pertinent History MI, CABG, HTN, DM, OA, Osteoporosis    Limitations Lifting;Standing;Walking;House hold activities    How long can you stand comfortably? 2 minutes max and then she has to hold onto something    How long can you walk comfortably? Short distances only less than 3  minutes    Patient Stated Goals PT wants to be able to walk better and be more stable.    Currently in Pain? No/denies                             [facility] Adult PT Treatment/Exercise - 08/31/19 0001  Knee/Hip Exercises: Aerobic   Nustep L4 x15 min UE/LE activity      Knee/Hip Exercises: Standing   Heel Raises Both;2 sets;10 reps    Forward Step Up Both;2 sets;10 reps;Hand Hold: 1;Step Height: 4"    Other Standing Knee Exercises side stepping with 4 steps each way x6    Other Standing Knee Exercises balance on airex x71mn      Knee/Hip Exercises: Seated   Long Arc Quad Strengthening;Right;2 sets;10 reps;Weights    Long Arc Quad Weight 4 lbs.    Marching Strengthening;Both;2 sets;10 reps;Weights    Marching Weights 4 lbs.    Sit to Sand with UE support   x10 elevated surface x2 low surface     Knee/Hip Exercises: Supine   Bridges Strengthening;Both;10 reps    Straight Leg Raises Strengthening;Both;1 set;10 reps    Other Supine Knee/Hip Exercises clamshell with red t-band x30                    PT Short Term Goals  - 08/31/19 1304      PT SHORT TERM GOAL #1   Title PT LE strength to be incresed by 1/2 grade to allow pt to come sit to stand from a chair without using UE with ease.    Baseline 08/31/19 able to perfrom with no difficulty    Time 3    Period Weeks    Status Achieved      PT SHORT TERM GOAL #2   Title Pt to be able to single leg stance on both LE for 15 seconds to reduce risk of falling.    Time 3    Period Weeks    Status New      PT SHORT TERM GOAL #3   Title PT to be able to walk 300 ft in a 2 minute period with the use of her cane to improve functional mobility    Time 2    Period Weeks    Status On-going             PT Long Term Goals - 08/31/19 1306      PT LONG TERM GOAL #1   Title Pt LE strength to improve one grade to be able to rise off of her couch without difficulty.    Time 6    Period Weeks    Status On-going      PT LONG TERM GOAL #2   Title Pt to have improved her foto score by 10 pt to demonstrate improved functional ability    Time 6    Period Weeks    Status On-going      PT LONG TERM GOAL #3   Title Pt TUG to be less than 10 seconds for pt to feel confident getting up and answering the door.    Time 6    Period Weeks    Status On-going      PT LONG TERM GOAL #4   Title PT to be I in walking with  a cane for 500 ft to improve I for short shopping trips    Time 6    Period Weeks    Status On-going                 Plan - 08/31/19 1340    Clinical Impression Statement Patient tolerated treatment well today. Patient able to progress with all exercises in supine, sitting and standing. Patient able to perform sit to stand with greater ease. Patient  met STG#1 today others progressing. Patient needs step up progression to help going in and out of house.    Personal Factors and Comorbidities Age;Comorbidity 2    Comorbidities CABG, HTN, DM, OA, osteoporosis    Examination-Activity Limitations  Bend;Carry;Dressing;Hygiene/Grooming;Lift;Locomotion Level;Stairs;Stand    Examination-Participation Restrictions Cleaning;Community Activity;Laundry;Shop;Meal Prep    Stability/Clinical Decision Making Stable/Uncomplicated    Rehab Potential Good    PT Frequency 2x / week    PT Duration 6 weeks    PT Treatment/Interventions Therapeutic activities;Therapeutic exercise;Balance training;Neuromuscular re-education;Patient/family education;Manual techniques    PT Next Visit Plan cont balance activity, heel/toe raises sit to stand, marching, tandem stance, tandem gait, side stepping with t-band progress to higher activites as pt allows.    Consulted and Agree with Plan of Care Patient           Patient will benefit from skilled therapeutic intervention in order to improve the following deficits and impairments:  Abnormal gait, Decreased activity tolerance, Decreased balance, Decreased strength, Difficulty walking  Visit Diagnosis: Other abnormalities of gait and mobility  Muscle weakness (generalized)     Problem List Patient Active Problem List   Diagnosis Date Noted  . Type 2 diabetes mellitus with hyperglycemia, without long-term current use of insulin ([place]) 01/27/2019  . Type 2 diabetes mellitus with stage 3b chronic kidney disease, without long-term current use of insulin ([place]) 01/27/2019  . Chronic renal insufficiency, stage 3 (moderate) 09/10/2017  . Pre-operative clearance 09/10/2017  . Chronic pain of both shoulders 04/27/2017  . Vitamin D deficiency 01/28/2016  . Non-insulin treated type 2 diabetes mellitus ([place]) 11/12/2015  . Statin intolerance 08/18/2013  . Neuromuscular disorder ([place])   . Hemochromatosis, hereditary ([place]) 11/24/2012  . S/P CABG x 2 08/09/2012  . PVC's (premature ventricular contractions) 08/09/2012  . Gout 06/20/2012  . Hypertension associated with diabetes ([place]) 06/20/2012  . HLD (hyperlipidemia) 06/20/2012  . Arthritis 06/20/2012    Phillips Climes, PTA 08/31/2019, 1:59 PM  [facility] [place], [place], [address] Phone: [phone]   Fax:  [phone]  Name: Carmen [name] MRN: [id number] Date of Birth: 04/24/1940

## 2019-09-05 ENCOUNTER — Encounter: Payer: Self-pay | Admitting: Physical Therapy

## 2019-09-05 ENCOUNTER — Ambulatory Visit: Payer: Medicare Other | Admitting: Physical Therapy

## 2019-09-05 ENCOUNTER — Other Ambulatory Visit: Payer: Self-pay

## 2019-09-05 DIAGNOSIS — R2689 Other abnormalities of gait and mobility: Secondary | ICD-10-CM

## 2019-09-05 DIAGNOSIS — M6281 Muscle weakness (generalized): Secondary | ICD-10-CM | POA: Diagnosis not present

## 2019-09-05 NOTE — Therapy (Signed)
[facility] [place], [place], [address] Phone: [phone]   Fax:  [phone]  Physical Therapy Treatment  Patient Details  Name: Carmen [name] MRN: [id number] Date of Birth: [birth year] Referring Provider (PT): Melony Overly    Encounter Date: 09/05/2019   PT End of Session - 09/05/19 1356    Visit Number 5    Number of Visits 12    Date for PT Re-Evaluation 09/29/19    Authorization Type [employer] MEDI[employer]; Progress note every 10th visit; KX modifier at 15th visit    PT Start Time 1348    PT Stop Time 1430    PT Time Calculation (min) 42 min    Activity Tolerance Patient tolerated treatment well    Behavior During Therapy [facility] for tasks assessed/performed           Past Medical History:  Diagnosis Date  . Arthritis   . Asymptomatic PVCs   . Bilateral shoulder pain   . CAD (coronary artery disease)   . Cancer ([place]) 11/2015   melanomax4  right upper arm  . Chronic renal insufficiency, stage 3 (moderate)   . Diabetes mellitus without complication ([place])   . Diverticulosis   . Endometrial polyp   . External hemorrhoids   . Gout   . Hemochromatosis, hereditary ([place]) 11/24/2012  . History of anemia   . History of duodenal ulcer 1990  . Hyperlipidemia   . Hypertension   . Internal hemorrhoids   . Jaundice    age 25 or 77  . Myocardial infarction ([place]) 2007  . Neuromuscular disorder ([place])    peripheral neuropathy  . PMB (postmenopausal bleeding)   . PONV (postoperative nausea and vomiting)   . Prolapse of female pelvic organs    uses pessary  . Seizures ([place])    had one at Dr. Antonieta Pert office after getting blood drawn  . Tick bite 08/12/2017   had 3 bites  . Vitamin D deficiency     Past Surgical History:  Procedure Laterality Date  . BREAST SURGERY     left breast lump--benign  . CARDIAC CATHETERIZATION  06/03/2005  . COLONOSCOPY  11/28/2001  . CORONARY ARTERY BYPASS GRAFT  2007   x2 Dr.  Roxan Hockey, LIMA to LAD, SVG to PDA  . DILATATION & CURETTAGE/HYSTEROSCOPY WITH MYOSURE N/A 09/28/2017   Procedure: DILATATION & CURETTAGE/HYSTEROSCOPY WITH MYOSURE;  Surgeon: Molli Posey, MD;  Location: [facility];  Service: Gynecology;  Laterality: N/A;  . LIPOMA EXCISION     back  . UPPER GI ENDOSCOPY  01/20/1989    There were no vitals filed for this visit.   Subjective Assessment - 09/05/19 1352    Subjective COVID-19 screening performed upon arrival. Patient arrived and feels good about HEP.    Pertinent History MI, CABG, HTN, DM, OA, Osteoporosis    Limitations Lifting;Standing;Walking;House hold activities    How long can you stand comfortably? 2 minutes max and then she has to hold onto something    How long can you walk comfortably? Short distances only less than 3  minutes    Patient Stated Goals PT wants to be able to walk better and be more stable.    Currently in Pain? No/denies              [facility] PT Assessment - 09/05/19 0001      Assessment   Medical Diagnosis abnormality of gait with decreased balance     Referring Provider (PT) Melony Overly  Onset Date/Surgical Date 12/22/18    Hand Dominance Right    Prior Therapy none      Precautions   Precautions Fall                         [facility] Adult PT Treatment/Exercise - 09/05/19 0001      Knee/Hip Exercises: Aerobic   Nustep L4 x17 min UE/LE activity      Knee/Hip Exercises: Machines for Strengthening   Cybex Knee Extension 10# 2x10 reps    Cybex Knee Flexion 20# 2x10 reps      Knee/Hip Exercises: Standing   Heel Raises Both;2 sets;10 reps    Heel Raises Limitations B toe raise x20 reps    Hip Abduction AROM;Both;2 sets;10 reps;Knee straight    Forward Step Up Both;2 sets;10 reps;Hand Hold: 1;Step Height: 4"    Functional Squat 15 reps;3 seconds    Other Standing Knee Exercises side stepping with 4 steps each way x6      Knee/Hip Exercises: Seated    Clamshell with TheraBand Green   X20 REPS   Sit to Sand 20 reps;without UE support                    PT Short Term Goals - 08/31/19 1304      PT SHORT TERM GOAL #1   Title PT LE strength to be incresed by 1/2 grade to allow pt to come sit to stand from a chair without using UE with ease.    Baseline 08/31/19 able to perfrom with no difficulty    Time 3    Period Weeks    Status Achieved      PT SHORT TERM GOAL #2   Title Pt to be able to single leg stance on both LE for 15 seconds to reduce risk of falling.    Time 3    Period Weeks    Status New      PT SHORT TERM GOAL #3   Title PT to be able to walk 300 ft in a 2 minute period with the use of her cane to improve functional mobility    Time 2    Period Weeks    Status On-going             PT Long Term Goals - 08/31/19 1306      PT LONG TERM GOAL #1   Title Pt LE strength to improve one grade to be able to rise off of her couch without difficulty.    Time 6    Period Weeks    Status On-going      PT LONG TERM GOAL #2   Title Pt to have improved her foto score by 10 pt to demonstrate improved functional ability    Time 6    Period Weeks    Status On-going      PT LONG TERM GOAL #3   Title Pt TUG to be less than 10 seconds for pt to feel confident getting up and answering the door.    Time 6    Period Weeks    Status On-going      PT LONG TERM GOAL #4   Title PT to be I in walking with  a cane for 500 ft to improve I for short shopping trips    Time 6    Period Weeks    Status On-going  Plan - 09/05/19 1440    Clinical Impression Statement Patient presented in clinic with more confidence and able to progress to more therex. Patient reported feeling more confident and compliant with HEP. Supervision provided with step up training as patient minimally unstable.    Personal Factors and Comorbidities Age;Comorbidity 2    Comorbidities CABG, HTN, DM, OA, osteoporosis     Examination-Activity Limitations Bend;Carry;Dressing;Hygiene/Grooming;Lift;Locomotion Level;Stairs;Stand    Examination-Participation Restrictions Cleaning;Community Activity;Laundry;Shop;Meal Prep    Stability/Clinical Decision Making Stable/Uncomplicated    Rehab Potential Good    PT Frequency 2x / week    PT Duration 6 weeks    PT Treatment/Interventions Therapeutic activities;Therapeutic exercise;Balance training;Neuromuscular re-education;Patient/family education;Manual techniques    PT Next Visit Plan cont balance activity, heel/toe raises sit to stand, marching, tandem stance, tandem gait, side stepping with t-band progress to higher activites as pt allows.    PT Home Exercise Plan LAQ,SLR< bridge, Hip abduction    Consulted and Agree with Plan of Care Patient           Patient will benefit from skilled therapeutic intervention in order to improve the following deficits and impairments:  Abnormal gait, Decreased activity tolerance, Decreased balance, Decreased strength, Difficulty walking  Visit Diagnosis: Other abnormalities of gait and mobility  Muscle weakness (generalized)     Problem List Patient Active Problem List   Diagnosis Date Noted  . Type 2 diabetes mellitus with hyperglycemia, without long-term current use of insulin ([place]) 01/27/2019  . Type 2 diabetes mellitus with stage 3b chronic kidney disease, without long-term current use of insulin ([place]) 01/27/2019  . Chronic renal insufficiency, stage 3 (moderate) 09/10/2017  . Pre-operative clearance 09/10/2017  . Chronic pain of both shoulders 04/27/2017  . Vitamin D deficiency 01/28/2016  . Non-insulin treated type 2 diabetes mellitus ([place]) 11/12/2015  . Statin intolerance 08/18/2013  . Neuromuscular disorder ([place])   . Hemochromatosis, hereditary ([place]) 11/24/2012  . S/P CABG x 2 08/09/2012  . PVC's (premature ventricular contractions) 08/09/2012  . Gout 06/20/2012  . Hypertension associated with diabetes ([place])  06/20/2012  . HLD (hyperlipidemia) 06/20/2012  . Arthritis 06/20/2012    Standley Brooking, PTA 09/05/2019, 2:43 PM  [facility] [address] [place], [place], [address] Phone: [phone]   Fax:  [phone]  Name: Carmen [name] MRN: [id number] Date of Birth: 1940/04/03

## 2019-09-07 ENCOUNTER — Ambulatory Visit: Payer: Medicare Other | Admitting: Physical Therapy

## 2019-09-07 ENCOUNTER — Other Ambulatory Visit: Payer: Self-pay

## 2019-09-07 ENCOUNTER — Encounter: Payer: Self-pay | Admitting: Physical Therapy

## 2019-09-07 DIAGNOSIS — M6281 Muscle weakness (generalized): Secondary | ICD-10-CM

## 2019-09-07 DIAGNOSIS — R2689 Other abnormalities of gait and mobility: Secondary | ICD-10-CM | POA: Diagnosis not present

## 2019-09-07 NOTE — Therapy (Signed)
[facility] [place], [place], [address] Phone: [phone]   Fax:  [phone]  Physical Therapy Treatment  Patient Details  Name: Carmen [name] MRN: [id number] Date of Birth: 1940/05/18 Referring Provider (PT): Melony Overly    Encounter Date: 09/07/2019   PT End of Session - 09/07/19 1355    Visit Number 6    Number of Visits 12    Date for PT Re-Evaluation 09/29/19    Authorization Type [employer] MEDI[employer]; Progress note every 10th visit; KX modifier at 15th visit    PT Start Time 1352    PT Stop Time 1432    PT Time Calculation (min) 40 min    Equipment Utilized During Treatment Other (comment)   SPC   Activity Tolerance Patient tolerated treatment well    Behavior During Therapy [facility] for tasks assessed/performed           Past Medical History:  Diagnosis Date  . Arthritis   . Asymptomatic PVCs   . Bilateral shoulder pain   . CAD (coronary artery disease)   . Cancer ([place]) 11/2015   melanomax4  right upper arm  . Chronic renal insufficiency, stage 3 (moderate)   . Diabetes mellitus without complication ([place])   . Diverticulosis   . Endometrial polyp   . External hemorrhoids   . Gout   . Hemochromatosis, hereditary ([place]) 11/24/2012  . History of anemia   . History of duodenal ulcer 1990  . Hyperlipidemia   . Hypertension   . Internal hemorrhoids   . Jaundice    age 20 or 40  . Myocardial infarction ([place]) 2007  . Neuromuscular disorder ([place])    peripheral neuropathy  . PMB (postmenopausal bleeding)   . PONV (postoperative nausea and vomiting)   . Prolapse of female pelvic organs    uses pessary  . Seizures ([place])    had one at Dr. Antonieta Pert office after getting blood drawn  . Tick bite 08/12/2017   had 3 bites  . Vitamin D deficiency     Past Surgical History:  Procedure Laterality Date  . BREAST SURGERY     left breast lump--benign  . CARDIAC CATHETERIZATION  06/03/2005  . COLONOSCOPY   11/28/2001  . CORONARY ARTERY BYPASS GRAFT  2007   x2 Dr. Roxan Hockey, LIMA to LAD, SVG to PDA  . DILATATION & CURETTAGE/HYSTEROSCOPY WITH MYOSURE N/A 09/28/2017   Procedure: DILATATION & CURETTAGE/HYSTEROSCOPY WITH MYOSURE;  Surgeon: Molli Posey, MD;  Location: [facility];  Service: Gynecology;  Laterality: N/A;  . LIPOMA EXCISION     back  . UPPER GI ENDOSCOPY  01/20/1989    There were no vitals filed for this visit.   Subjective Assessment - 09/07/19 1354    Subjective COVID-19 screening performed upon arrival. Patient reports doing HEP at her kitchen sink. Told her daughters she was practicing stairs.    Pertinent History MI, CABG, HTN, DM, OA, Osteoporosis    Limitations Lifting;Standing;Walking;House hold activities    How long can you stand comfortably? 2 minutes max and then she has to hold onto something    How long can you walk comfortably? Short distances only less than 3  minutes    Patient Stated Goals PT wants to be able to walk better and be more stable.    Currently in Pain? No/denies              [facility] PT Assessment - 09/07/19 0001      Assessment  Medical Diagnosis abnormality of gait with decreased balance     Referring Provider (PT) Melony Overly     Onset Date/Surgical Date 12/22/18    Hand Dominance Right    Next MD Visit N/A    Prior Therapy none      Precautions   Precautions Fall                         [facility] Adult PT Treatment/Exercise - 09/07/19 0001      Knee/Hip Exercises: Aerobic   Nustep L5 x15 min      Knee/Hip Exercises: Machines for Strengthening   Cybex Knee Extension 10# 2x10 reps    Cybex Knee Flexion 20# 2x10 reps      Knee/Hip Exercises: Standing   Heel Raises Both;2 sets;10 reps    Heel Raises Limitations B toe raise x20 reps    Forward Step Up Both;2 sets;10 reps;Hand Hold: 1;Step Height: 6"    Functional Squat 20 reps    Other Standing Knee Exercises sidestepping red theraband //  bars x3 RT                    PT Short Term Goals - 08/31/19 1304      PT SHORT TERM GOAL #1   Title PT LE strength to be incresed by 1/2 grade to allow pt to come sit to stand from a chair without using UE with ease.    Baseline 08/31/19 able to perfrom with no difficulty    Time 3    Period Weeks    Status Achieved      PT SHORT TERM GOAL #2   Title Pt to be able to single leg stance on both LE for 15 seconds to reduce risk of falling.    Time 3    Period Weeks    Status New      PT SHORT TERM GOAL #3   Title PT to be able to walk 300 ft in a 2 minute period with the use of her cane to improve functional mobility    Time 2    Period Weeks    Status On-going             PT Long Term Goals - 08/31/19 1306      PT LONG TERM GOAL #1   Title Pt LE strength to improve one grade to be able to rise off of her couch without difficulty.    Time 6    Period Weeks    Status On-going      PT LONG TERM GOAL #2   Title Pt to have improved her foto score by 10 pt to demonstrate improved functional ability    Time 6    Period Weeks    Status On-going      PT LONG TERM GOAL #3   Title Pt TUG to be less than 10 seconds for pt to feel confident getting up and answering the door.    Time 6    Period Weeks    Status On-going      PT LONG TERM GOAL #4   Title PT to be I in walking with  a cane for 500 ft to improve I for short shopping trips    Time 6    Period Weeks    Status On-going                 Plan - 09/07/19 1440  Clinical Impression Statement Patient presented in clinic with reports of HEP compliance at her sink. Patient proud of her accomplishment with forward step ups. Patient continues to use [facility] for ambulation.    Personal Factors and Comorbidities Age;Comorbidity 2    Comorbidities CABG, HTN, DM, OA, osteoporosis    Examination-Activity Limitations Bend;Carry;Dressing;Hygiene/Grooming;Lift;Locomotion Level;Stairs;Stand     Examination-Participation Restrictions Cleaning;Community Activity;Laundry;Shop;Meal Prep    Stability/Clinical Decision Making Stable/Uncomplicated    Rehab Potential Good    PT Frequency 2x / week    PT Duration 6 weeks    PT Treatment/Interventions Therapeutic activities;Therapeutic exercise;Balance training;Neuromuscular re-education;Patient/family education;Manual techniques    PT Next Visit Plan cont balance activity, heel/toe raises sit to stand, marching, tandem stance, tandem gait, side stepping with t-band progress to higher activites as pt allows.    PT Home Exercise Plan LAQ,SLR< bridge, Hip abduction    Consulted and Agree with Plan of Care Patient           Patient will benefit from skilled therapeutic intervention in order to improve the following deficits and impairments:  Abnormal gait, Decreased activity tolerance, Decreased balance, Decreased strength, Difficulty walking  Visit Diagnosis: Other abnormalities of gait and mobility  Muscle weakness (generalized)     Problem List Patient Active Problem List   Diagnosis Date Noted  . Type 2 diabetes mellitus with hyperglycemia, without long-term current use of insulin ([place]) 01/27/2019  . Type 2 diabetes mellitus with stage 3b chronic kidney disease, without long-term current use of insulin ([place]) 01/27/2019  . Chronic renal insufficiency, stage 3 (moderate) 09/10/2017  . Pre-operative clearance 09/10/2017  . Chronic pain of both shoulders 04/27/2017  . Vitamin D deficiency 01/28/2016  . Non-insulin treated type 2 diabetes mellitus ([place]) 11/12/2015  . Statin intolerance 08/18/2013  . Neuromuscular disorder ([place])   . Hemochromatosis, hereditary ([place]) 11/24/2012  . S/P CABG x 2 08/09/2012  . PVC's (premature ventricular contractions) 08/09/2012  . Gout 06/20/2012  . Hypertension associated with diabetes ([place]) 06/20/2012  . HLD (hyperlipidemia) 06/20/2012  . Arthritis 06/20/2012    Standley Brooking,  PTA 09/07/2019, 2:46 PM  [facility] [address] [place], [place], [address] Phone: [phone]   Fax:  [phone]  Name: Carmen [name] MRN: [id number] Date of Birth: Jul 08, 1940

## 2019-09-09 ENCOUNTER — Other Ambulatory Visit: Payer: Self-pay | Admitting: Internal Medicine

## 2019-09-12 ENCOUNTER — Encounter: Payer: Self-pay | Admitting: Physical Therapy

## 2019-09-12 ENCOUNTER — Ambulatory Visit: Payer: Medicare Other | Admitting: Physical Therapy

## 2019-09-12 ENCOUNTER — Other Ambulatory Visit: Payer: Self-pay

## 2019-09-12 DIAGNOSIS — M6281 Muscle weakness (generalized): Secondary | ICD-10-CM | POA: Diagnosis not present

## 2019-09-12 DIAGNOSIS — R2689 Other abnormalities of gait and mobility: Secondary | ICD-10-CM

## 2019-09-12 NOTE — Therapy (Signed)
[facility] [place], [place], [address] Phone: [phone]   Fax:  [phone]  Physical Therapy Treatment  Patient Details  Name: Carmen [name] MRN: [id number] Date of Birth: 16-Mar-1941 Referring Provider (PT): Melony Overly    Encounter Date: 09/12/2019   PT End of Session - 09/12/19 1439    Visit Number 7    Number of Visits 12    Date for PT Re-Evaluation 09/29/19    Authorization Type [employer]; Progress note every 10th visit; KX modifier at 15th visit    PT Start Time 1432    PT Stop Time 1514    PT Time Calculation (min) 42 min    Equipment Utilized During Treatment Other (comment)   SPC   Activity Tolerance Patient tolerated treatment well    Behavior During Therapy [facility] for tasks assessed/performed           Past Medical History:  Diagnosis Date  . Arthritis   . Asymptomatic PVCs   . Bilateral shoulder pain   . CAD (coronary artery disease)   . Cancer ([place]) 11/2015   melanomax4  right upper arm  . Chronic renal insufficiency, stage 3 (moderate)   . Diabetes mellitus without complication ([place])   . Diverticulosis   . Endometrial polyp   . External hemorrhoids   . Gout   . Hemochromatosis, hereditary ([place]) 11/24/2012  . History of anemia   . History of duodenal ulcer 1990  . Hyperlipidemia   . Hypertension   . Internal hemorrhoids   . Jaundice    age 79 or 79  . Myocardial infarction ([place]) 2007  . Neuromuscular disorder ([place])    peripheral neuropathy  . PMB (postmenopausal bleeding)   . PONV (postoperative nausea and vomiting)   . Prolapse of female pelvic organs    uses pessary  . Seizures ([place])    had one at Dr. Antonieta Pert office after getting blood drawn  . Tick bite 08/12/2017   had 3 bites  . Vitamin D deficiency     Past Surgical History:  Procedure Laterality Date  . BREAST SURGERY     left breast lump--benign  . CARDIAC CATHETERIZATION  06/03/2005  . COLONOSCOPY   11/28/2001  . CORONARY ARTERY BYPASS GRAFT  2007   x2 Dr. Roxan Hockey, LIMA to LAD, SVG to PDA  . DILATATION & CURETTAGE/HYSTEROSCOPY WITH MYOSURE N/A 09/28/2017   Procedure: DILATATION & CURETTAGE/HYSTEROSCOPY WITH MYOSURE;  Surgeon: Molli Posey, MD;  Location[facility];  Service: Gynecology;  Laterality: N/A;  . LIPOMA EXCISION     back  . UPPER GI ENDOSCOPY  01/20/1989    There were no vitals filed for this visit.   Subjective Assessment - 09/12/19 1437    Subjective COVID-19 screening performed upon arrival. Reports that she went up the stairs without holding to a rail. Reports she is starting to feel much stronger.    Pertinent History MI, CABG, HTN, DM, OA, Osteoporosis    Limitations Lifting;Standing;Walking;House hold activities    How long can you stand comfortably? 2 minutes max and then she has to hold onto something    How long can you walk comfortably? Short distances only less than 3  minutes    Patient Stated Goals PT wants to be able to walk better and be more stable.    Currently in Pain? No/denies              [facility] PT Assessment - 09/12/19 0001  Assessment   Medical Diagnosis abnormality of gait with decreased balance     Referring Provider (PT) Melony Overly     Onset Date/Surgical Date 12/22/18    Hand Dominance Right    Next MD Visit N/A    Prior Therapy none      Precautions   Precautions Bernerd Limbo Adult PT Treatment/Exercise - 09/12/19 0001      Knee/Hip Exercises: Aerobic   Nustep L4 x17 min      Knee/Hip Exercises: Machines for Strengthening   Cybex Knee Extension 10# 3x10 reps    Cybex Knee Flexion 20# 3x10 reps      Knee/Hip Exercises: Standing   Heel Raises Both;2 sets;10 reps    Hip Abduction AROM;Both;2 sets;10 reps;Knee straight    Forward Step Up Both;2 sets;10 reps;Hand Hold: 1;Step Height: 6"   more difficulty with L forward step up   Other Standing Knee  Exercises sidestepping // bars x5 RT    Other Standing Knee Exercises B alternating 6" step heel dots x15 reps      Knee/Hip Exercises: Seated   Sit to Sand 20 reps;without UE support   holding 2# ball                   PT Short Term Goals - 08/31/19 1304      PT SHORT TERM GOAL #1   Title PT LE strength to be incresed by 1/2 grade to allow pt to come sit to stand from a chair without using UE with ease.    Baseline 08/31/19 able to perfrom with no difficulty    Time 3    Period Weeks    Status Achieved      PT SHORT TERM GOAL #2   Title Pt to be able to single leg stance on both LE for 15 seconds to reduce risk of falling.    Time 3    Period Weeks    Status New      PT SHORT TERM GOAL #3   Title PT to be able to walk 300 ft in a 2 minute period with the use of her cane to improve functional mobility    Time 2    Period Weeks    Status On-going             PT Long Term Goals - 08/31/19 1306      PT LONG TERM GOAL #1   Title Pt LE strength to improve one grade to be able to rise off of her couch without difficulty.    Time 6    Period Weeks    Status On-going      PT LONG TERM GOAL #2   Title Pt to have improved her foto score by 10 pt to demonstrate improved functional ability    Time 6    Period Weeks    Status On-going      PT LONG TERM GOAL #3   Title Pt TUG to be less than 10 seconds for pt to feel confident getting up and answering the door.    Time 6    Period Weeks    Status On-going      PT LONG TERM GOAL #4   Title PT to be I in walking with  a cane for 500 ft to improve I for short shopping trips  Time 6    Period Weeks    Status On-going                 Plan - 09/12/19 1527    Clinical Impression Statement Patient continues to report more strength and more confidence within her home but uses cane more when she is fatigued. Patient guided through more LE strengthening without complaint of fatigue. Patient more unstable with L  forward step up on RLE SLS.    Personal Factors and Comorbidities Age;Comorbidity 2    Comorbidities CABG, HTN, DM, OA, osteoporosis    Examination-Activity Limitations Bend;Carry;Dressing;Hygiene/Grooming;Lift;Locomotion Level;Stairs;Stand    Examination-Participation Restrictions Cleaning;Community Activity;Laundry;Shop;Meal Prep    Stability/Clinical Decision Making Stable/Uncomplicated    Rehab Potential Good    PT Frequency 2x / week    PT Duration 6 weeks    PT Treatment/Interventions Therapeutic activities;Therapeutic exercise;Balance training;Neuromuscular re-education;Patient/family education;Manual techniques    PT Next Visit Plan cont balance activity, heel/toe raises sit to stand, marching, tandem stance, tandem gait, side stepping with t-band progress to higher activites as pt allows.    PT Home Exercise Plan LAQ,SLR< bridge, Hip abduction    Consulted and Agree with Plan of Care Patient           Patient will benefit from skilled therapeutic intervention in order to improve the following deficits and impairments:  Abnormal gait, Decreased activity tolerance, Decreased balance, Decreased strength, Difficulty walking  Visit Diagnosis: Other abnormalities of gait and mobility  Muscle weakness (generalized)     Problem List Patient Active Problem List   Diagnosis Date Noted  . Type 2 diabetes mellitus with hyperglycemia, without long-term current use of insulin ([place]) 01/27/2019  . Type 2 diabetes mellitus with stage 3b chronic kidney disease, without long-term current use of insulin ([place]) 01/27/2019  . Chronic renal insufficiency, stage 3 (moderate) 09/10/2017  . Pre-operative clearance 09/10/2017  . Chronic pain of both shoulders 04/27/2017  . Vitamin D deficiency 01/28/2016  . Non-insulin treated type 2 diabetes mellitus ([place]) 11/12/2015  . Statin intolerance 08/18/2013  . Neuromuscular disorder ([place])   . Hemochromatosis, hereditary ([place]) 11/24/2012  . S/P CABG x  2 08/09/2012  . PVC's (premature ventricular contractions) 08/09/2012  . Gout 06/20/2012  . Hypertension associated with diabetes ([place]) 06/20/2012  . HLD (hyperlipidemia) 06/20/2012  . Arthritis 06/20/2012    Standley Brooking, PTA 09/12/2019, 3:30 PM  [facility] [address] [place], [place], [address] Phone: [phone]   Fax:  [phone]  Name: Carmen [name] MRN: [id number] Date of Birth: February 04, 1941

## 2019-09-14 ENCOUNTER — Other Ambulatory Visit: Payer: Self-pay

## 2019-09-14 ENCOUNTER — Encounter: Payer: Self-pay | Admitting: Physical Therapy

## 2019-09-14 ENCOUNTER — Ambulatory Visit: Payer: Medicare Other | Admitting: Physical Therapy

## 2019-09-14 DIAGNOSIS — R2689 Other abnormalities of gait and mobility: Secondary | ICD-10-CM | POA: Diagnosis not present

## 2019-09-14 DIAGNOSIS — M6281 Muscle weakness (generalized): Secondary | ICD-10-CM | POA: Diagnosis not present

## 2019-09-14 NOTE — Therapy (Signed)
[facility] [place], [place], [address] Phone: [phone]   Fax:  [phone]  Physical Therapy Treatment  Patient Details  Name: Carmen [name] MRN: [id number] Date of Birth: 1941-03-13 Referring Provider (PT): Melony Overly    Encounter Date: 09/14/2019   PT End of Session - 09/14/19 1447    Visit Number 8    Number of Visits 12    Date for PT Re-Evaluation 09/29/19    Authorization Type [employer]; Progress note every 10th visit; KX modifier at 15th visit    PT Start Time 1438    PT Stop Time 1515   limited by late start to PT   PT Time Calculation (min) 37 min    Equipment Utilized During Treatment Other (comment)   SPC   Activity Tolerance Patient tolerated treatment well    Behavior During Therapy [facility] for tasks assessed/performed           Past Medical History:  Diagnosis Date  . Arthritis   . Asymptomatic PVCs   . Bilateral shoulder pain   . CAD (coronary artery disease)   . Cancer ([place]) 11/2015   melanomax4  right upper arm  . Chronic renal insufficiency, stage 3 (moderate)   . Diabetes mellitus without complication ([place])   . Diverticulosis   . Endometrial polyp   . External hemorrhoids   . Gout   . Hemochromatosis, hereditary ([place]) 11/24/2012  . History of anemia   . History of duodenal ulcer 1990  . Hyperlipidemia   . Hypertension   . Internal hemorrhoids   . Jaundice    age 52 or 75  . Myocardial infarction ([place]) 2007  . Neuromuscular disorder ([place])    peripheral neuropathy  . PMB (postmenopausal bleeding)   . PONV (postoperative nausea and vomiting)   . Prolapse of female pelvic organs    uses pessary  . Seizures ([place])    had one at Dr. Antonieta Pert office after getting blood drawn  . Tick bite 08/12/2017   had 3 bites  . Vitamin D deficiency     Past Surgical History:  Procedure Laterality Date  . BREAST SURGERY     left breast lump--benign  . CARDIAC CATHETERIZATION   06/03/2005  . COLONOSCOPY  11/28/2001  . CORONARY ARTERY BYPASS GRAFT  2007   x2 Dr. Roxan Hockey, LIMA to LAD, SVG to PDA  . DILATATION & CURETTAGE/HYSTEROSCOPY WITH MYOSURE N/A 09/28/2017   Procedure: DILATATION & CURETTAGE/HYSTEROSCOPY WITH MYOSURE;  Surgeon: Molli Posey, MD;  Location: [facility];  Service: Gynecology;  Laterality: N/A;  . LIPOMA EXCISION     back  . UPPER GI ENDOSCOPY  01/20/1989    There were no vitals filed for this visit.   Subjective Assessment - 09/14/19 1446    Subjective COVID-19 screening performed upon arrival.    Pertinent History MI, CABG, HTN, DM, OA, Osteoporosis    Limitations Lifting;Standing;Walking;House hold activities    How long can you stand comfortably? 2 minutes max and then she has to hold onto something    How long can you walk comfortably? Short distances only less than 3  minutes    Patient Stated Goals PT wants to be able to walk better and be more stable.              [facility] PT Assessment - 09/14/19 0001      Assessment   Medical Diagnosis abnormality of gait with decreased balance     Referring Provider (  PT) Melony Overly     Onset Date/Surgical Date 12/22/18    Hand Dominance Right    Next MD Visit N/A    Prior Therapy none      Precautions   Precautions Fall                         [facility] Adult PT Treatment/Exercise - 09/14/19 0001      Knee/Hip Exercises: Aerobic   Nustep L4 x15 min      Knee/Hip Exercises: Machines for Strengthening   Cybex Knee Extension 10# 3x10 reps    Cybex Knee Flexion 30# 3x10 reps      Knee/Hip Exercises: Standing   Hip Abduction Stengthening;Both;20 reps;Knee straight;Limitations    Abduction Limitations red theraband    Hip Extension Stengthening;Both;2 sets;10 reps;Knee straight;Limitations    Extension Limitations red theraband    Forward Step Up Both;2 sets;10 reps;Hand Hold: 1;Step Height: 6"    Other Standing Knee Exercises sidestepping //  bars x5 RT red theraband      Knee/Hip Exercises: Seated   Clamshell with TheraBand Red   x20 reps                   PT Short Term Goals - 08/31/19 1304      PT SHORT TERM GOAL #1   Title PT LE strength to be incresed by 1/2 grade to allow pt to come sit to stand from a chair without using UE with ease.    Baseline 08/31/19 able to perfrom with no difficulty    Time 3    Period Weeks    Status Achieved      PT SHORT TERM GOAL #2   Title Pt to be able to single leg stance on both LE for 15 seconds to reduce risk of falling.    Time 3    Period Weeks    Status New      PT SHORT TERM GOAL #3   Title PT to be able to walk 300 ft in a 2 minute period with the use of her cane to improve functional mobility    Time 2    Period Weeks    Status On-going             PT Long Term Goals - 08/31/19 1306      PT LONG TERM GOAL #1   Title Pt LE strength to improve one grade to be able to rise off of her couch without difficulty.    Time 6    Period Weeks    Status On-going      PT LONG TERM GOAL #2   Title Pt to have improved her foto score by 10 pt to demonstrate improved functional ability    Time 6    Period Weeks    Status On-going      PT LONG TERM GOAL #3   Title Pt TUG to be less than 10 seconds for pt to feel confident getting up and answering the door.    Time 6    Period Weeks    Status On-going      PT LONG TERM GOAL #4   Title PT to be I in walking with  a cane for 500 ft to improve I for short shopping trips    Time 6    Period Weeks    Status On-going  Plan - 09/14/19 1609    Clinical Impression Statement Patient presented in clinic with more confidence as she is able to ambulate stairs more easily. Patient able to demonstrate stability until she fatigues which occured with forward step ups. Patient continues to use [facility] during ambulation but ultimate goal is to not have to use AD anymore.    Personal Factors and  Comorbidities Age;Comorbidity 2    Comorbidities CABG, HTN, DM, OA, osteoporosis    Examination-Activity Limitations Bend;Carry;Dressing;Hygiene/Grooming;Lift;Locomotion Level;Stairs;Stand    Examination-Participation Restrictions Cleaning;Community Activity;Laundry;Shop;Meal Prep    Stability/Clinical Decision Making Stable/Uncomplicated    Rehab Potential Good    PT Frequency 2x / week    PT Duration 6 weeks    PT Treatment/Interventions Therapeutic activities;Therapeutic exercise;Balance training;Neuromuscular re-education;Patient/family education;Manual techniques    PT Next Visit Plan cont balance activity, heel/toe raises sit to stand, marching, tandem stance, tandem gait, side stepping with t-band progress to higher activites as pt allows.    PT Home Exercise Plan LAQ,SLR< bridge, Hip abduction    Consulted and Agree with Plan of Care Patient           Patient will benefit from skilled therapeutic intervention in order to improve the following deficits and impairments:  Abnormal gait, Decreased activity tolerance, Decreased balance, Decreased strength, Difficulty walking  Visit Diagnosis: Other abnormalities of gait and mobility  Muscle weakness (generalized)     Problem List Patient Active Problem List   Diagnosis Date Noted  . Type 2 diabetes mellitus with hyperglycemia, without long-term current use of insulin ([place]) 01/27/2019  . Type 2 diabetes mellitus with stage 3b chronic kidney disease, without long-term current use of insulin ([place]) 01/27/2019  . Chronic renal insufficiency, stage 3 (moderate) 09/10/2017  . Pre-operative clearance 09/10/2017  . Chronic pain of both shoulders 04/27/2017  . Vitamin D deficiency 01/28/2016  . Non-insulin treated type 2 diabetes mellitus ([place]) 11/12/2015  . Statin intolerance 08/18/2013  . Neuromuscular disorder ([place])   . Hemochromatosis, hereditary ([place]) 11/24/2012  . S/P CABG x 2 08/09/2012  . PVC's (premature ventricular  contractions) 08/09/2012  . Gout 06/20/2012  . Hypertension associated with diabetes ([place]) 06/20/2012  . HLD (hyperlipidemia) 06/20/2012  . Arthritis 06/20/2012    Standley Brooking, PTA 09/14/2019, 4:13 PM  [facility] [place], [place], [address] Phone: [phone]   Fax:  [phone]  Name: LATAUSHA FLAMM MRN: [id number] Date of Birth: 07-15-1940

## 2019-09-19 ENCOUNTER — Ambulatory Visit: Payer: Medicare Other | Admitting: Physical Therapy

## 2019-09-19 ENCOUNTER — Other Ambulatory Visit: Payer: Self-pay

## 2019-09-19 ENCOUNTER — Encounter: Payer: Self-pay | Admitting: Physical Therapy

## 2019-09-19 DIAGNOSIS — R2689 Other abnormalities of gait and mobility: Secondary | ICD-10-CM | POA: Diagnosis not present

## 2019-09-19 DIAGNOSIS — M6281 Muscle weakness (generalized): Secondary | ICD-10-CM

## 2019-09-19 NOTE — Therapy (Signed)
[facility] [place], [place], [address] Phone: [phone]   Fax:  [phone]  Physical Therapy Treatment  Patient Details  Name: Carmen [name] MRN: [id number] Date of Birth: 1940/11/26 Referring Provider (PT): Melony Overly    Encounter Date: 09/19/2019   PT End of Session - 09/19/19 1526    Visit Number 9    Number of Visits 12    Date for PT Re-Evaluation 09/29/19    Authorization Type [employer]; Progress note every 10th visit; KX modifier at 15th visit    PT Start Time 1516    PT Stop Time 1558    PT Time Calculation (min) 42 min    Equipment Utilized During Treatment Other (comment)   SPC   Activity Tolerance Patient tolerated treatment well    Behavior During Therapy [facility] for tasks assessed/performed           Past Medical History:  Diagnosis Date  . Arthritis   . Asymptomatic PVCs   . Bilateral shoulder pain   . CAD (coronary artery disease)   . Cancer ([place]) 11/2015   melanomax4  right upper arm  . Chronic renal insufficiency, stage 3 (moderate)   . Diabetes mellitus without complication ([place])   . Diverticulosis   . Endometrial polyp   . External hemorrhoids   . Gout   . Hemochromatosis, hereditary ([place]) 11/24/2012  . History of anemia   . History of duodenal ulcer 1990  . Hyperlipidemia   . Hypertension   . Internal hemorrhoids   . Jaundice    age 49 or 65  . Myocardial infarction ([place]) 2007  . Neuromuscular disorder ([place])    peripheral neuropathy  . PMB (postmenopausal bleeding)   . PONV (postoperative nausea and vomiting)   . Prolapse of female pelvic organs    uses pessary  . Seizures ([place])    had one at Dr. Antonieta Pert office after getting blood drawn  . Tick bite 08/12/2017   had 3 bites  . Vitamin D deficiency     Past Surgical History:  Procedure Laterality Date  . BREAST SURGERY     left breast lump--benign  . CARDIAC CATHETERIZATION  06/03/2005  . COLONOSCOPY   11/28/2001  . CORONARY ARTERY BYPASS GRAFT  2007   x2 Dr. Roxan Hockey, LIMA to LAD, SVG to PDA  . DILATATION & CURETTAGE/HYSTEROSCOPY WITH MYOSURE N/A 09/28/2017   Procedure: DILATATION & CURETTAGE/HYSTEROSCOPY WITH MYOSURE;  Surgeon: Molli Posey, MD;  Location: [facility];  Service: Gynecology;  Laterality: N/A;  . LIPOMA EXCISION     back  . UPPER GI ENDOSCOPY  01/20/1989    There were no vitals filed for this visit.   Subjective Assessment - 09/19/19 1525    Subjective COVID-19 screening performed upon arrival. Reports feeling alright. Her sugar levels have been up and down today due to a new medication.    Pertinent History MI, CABG, HTN, DM, OA, Osteoporosis    Limitations Lifting;Standing;Walking;House hold activities    How long can you stand comfortably? 2 minutes max and then she has to hold onto something    How long can you walk comfortably? Short distances only less than 3  minutes    Patient Stated Goals PT wants to be able to walk better and be more stable.    Currently in Pain? No/denies              [facility] PT Assessment - 09/19/19 0001  Assessment   Medical Diagnosis abnormality of gait with decreased balance     Referring Provider (PT) Melony Overly     Onset Date/Surgical Date 12/22/18    Hand Dominance Right    Next MD Visit N/A    Prior Therapy none      Precautions   Precautions [facility] [facility] Adult PT Treatment/Exercise - 09/19/19 0001      Knee/Hip Exercises: Aerobic   Nustep L4 x15 min      Knee/Hip Exercises: Machines for Strengthening   Cybex Knee Extension 10# 3x10 reps    Cybex Knee Flexion 30# 3x10 reps      Knee/Hip Exercises: Standing   Hip Abduction Stengthening;Both;Knee straight;3 sets;10 reps    Rocker Board 3 minutes    Rocker Board Limitations calf stretching      Knee/Hip Exercises: Seated   Sit to Sand without UE support;2 sets;10 reps   holding 4# medicine ball;  lowering pinth for 2nd set              Balance Exercises - 09/19/19 0001      Balance Exercises: Standing   SLS with Vectors Solid surface;Other reps (comment)   colored pod tapping 2x10 each   Step Ups Lateral;6 inch;UE support 2   lateral A stepping   Sidestepping Other reps (comment)   x2 mins with red theraband   Heel Raises Both;20 reps    Toe Raise Both;20 reps               PT Short Term Goals - 08/31/19 1304      PT SHORT TERM GOAL #1   Title PT LE strength to be incresed by 1/2 grade to allow pt to come sit to stand from a chair without using UE with ease.    Baseline 08/31/19 able to perfrom with no difficulty    Time 3    Period Weeks    Status Achieved      PT SHORT TERM GOAL #2   Title Pt to be able to single leg stance on both LE for 15 seconds to reduce risk of falling.    Time 3    Period Weeks    Status New      PT SHORT TERM GOAL #3   Title PT to be able to walk 300 ft in a 2 minute period with the use of her cane to improve functional mobility    Time 2    Period Weeks    Status On-going             PT Long Term Goals - 08/31/19 1306      PT LONG TERM GOAL #1   Title Pt LE strength to improve one grade to be able to rise off of her couch without difficulty.    Time 6    Period Weeks    Status On-going      PT LONG TERM GOAL #2   Title Pt to have improved her foto score by 10 pt to demonstrate improved functional ability    Time 6    Period Weeks    Status On-going      PT LONG TERM GOAL #3   Title Pt TUG to be less than 10 seconds for pt to feel confident getting up and answering the door.    Time 6  Period Weeks    Status On-going      PT LONG TERM GOAL #4   Title PT to be I in walking with  a cane for 500 ft to improve I for short shopping trips    Time 6    Period Weeks    Status On-going                 Plan - 09/19/19 1609    Clinical Impression Statement Patient responded well to therapy session and  reports feeling more confident with balance activities. Patient was able to complete all exercises despite feeling "off" due to blood sugar changes. Patient still with intermttent UE support for balance activities but noted improvements in proper strategies for maintaining balance.    Personal Factors and Comorbidities Age;Comorbidity 2    Comorbidities CABG, HTN, DM, OA, osteoporosis    Examination-Activity Limitations Bend;Carry;Dressing;Hygiene/Grooming;Lift;Locomotion Level;Stairs;Stand    Examination-Participation Restrictions Cleaning;Community Activity;Laundry;Shop;Meal Prep    Stability/Clinical Decision Making Stable/Uncomplicated    Clinical Decision Making Low    Rehab Potential Good    PT Frequency 2x / week    PT Duration 6 weeks    PT Treatment/Interventions Therapeutic activities;Therapeutic exercise;Balance training;Neuromuscular re-education;Patient/family education;Manual techniques    PT Next Visit Plan cont balance activity, heel/toe raises sit to stand, marching, tandem stance, tandem gait, side stepping with t-band progress to higher activites as pt allows.    PT Home Exercise Plan LAQ,SLR< bridge, Hip abduction    Consulted and Agree with Plan of Care Patient           Patient will benefit from skilled therapeutic intervention in order to improve the following deficits and impairments:  Abnormal gait, Decreased activity tolerance, Decreased balance, Decreased strength, Difficulty walking  Visit Diagnosis: Other abnormalities of gait and mobility  Muscle weakness (generalized)     Problem List Patient Active Problem List   Diagnosis Date Noted  . Type 2 diabetes mellitus with hyperglycemia, without long-term current use of insulin ([place]) 01/27/2019  . Type 2 diabetes mellitus with stage 3b chronic kidney disease, without long-term current use of insulin ([place]) 01/27/2019  . Chronic renal insufficiency, stage 3 (moderate) 09/10/2017  . Pre-operative clearance  09/10/2017  . Chronic pain of both shoulders 04/27/2017  . Vitamin D deficiency 01/28/2016  . Non-insulin treated type 2 diabetes mellitus ([place]) 11/12/2015  . Statin intolerance 08/18/2013  . Neuromuscular disorder ([place])   . Hemochromatosis, hereditary ([place]) 11/24/2012  . S/P CABG x 2 08/09/2012  . PVC's (premature ventricular contractions) 08/09/2012  . Gout 06/20/2012  . Hypertension associated with diabetes ([place]) 06/20/2012  . HLD (hyperlipidemia) 06/20/2012  . Arthritis 06/20/2012    Gabriela Eves, PT, DPT 09/19/2019, 4:12 PM  [facility] [address] [place], [place], [address] Phone: [phone]   Fax:  [phone]  Name: Carmen [name] MRN: [id number] Date of Birth: Jun 03, 1940

## 2019-09-21 ENCOUNTER — Other Ambulatory Visit: Payer: Self-pay

## 2019-09-21 ENCOUNTER — Encounter: Payer: Self-pay | Admitting: Physical Therapy

## 2019-09-21 ENCOUNTER — Ambulatory Visit: Payer: Medicare Other | Attending: Otolaryngology | Admitting: Physical Therapy

## 2019-09-21 DIAGNOSIS — M6281 Muscle weakness (generalized): Secondary | ICD-10-CM

## 2019-09-21 DIAGNOSIS — R2689 Other abnormalities of gait and mobility: Secondary | ICD-10-CM | POA: Diagnosis not present

## 2019-09-21 NOTE — Therapy (Signed)
[facility] [place], [place], [address] Phone: [phone]   Fax:  [phone]  Physical Therapy Treatment   Progress Note Reporting Period 08/18/2019 to 09/21/2019  See note below for Objective Data and Assessment of Progress/Goals.  Patient progressing toward goals and has noted improvements with balance. Gabriela Eves, PT, DPT     Patient Details  Name: Carmen [name] MRN: [id number] Date of Birth: Feb 20, 1941 Referring Provider (PT): Melony Overly    Encounter Date: 09/21/2019   PT End of Session - 09/21/19 1306    Visit Number 10    Number of Visits 18    Date for PT Re-Evaluation 10/20/19    Authorization Type [employer]; Progress note every 10th visit; KX modifier at 15th visit    PT Start Time 1302    PT Stop Time 1348    PT Time Calculation (min) 46 min    Equipment Utilized During Treatment Other (comment)   SPC   Activity Tolerance Patient tolerated treatment well    Behavior During Therapy [facility] for tasks assessed/performed           Past Medical History:  Diagnosis Date  . Arthritis   . Asymptomatic PVCs   . Bilateral shoulder pain   . CAD (coronary artery disease)   . Cancer ([place]) 11/2015   melanomax4  right upper arm  . Chronic renal insufficiency, stage 3 (moderate)   . Diabetes mellitus without complication ([place])   . Diverticulosis   . Endometrial polyp   . External hemorrhoids   . Gout   . Hemochromatosis, hereditary ([place]) 11/24/2012  . History of anemia   . History of duodenal ulcer 1990  . Hyperlipidemia   . Hypertension   . Internal hemorrhoids   . Jaundice    age 79 or 53  . Myocardial infarction ([place]) 2007  . Neuromuscular disorder ([place])    peripheral neuropathy  . PMB (postmenopausal bleeding)   . PONV (postoperative nausea and vomiting)   . Prolapse of female pelvic organs    uses pessary  . Seizures ([place])    had one at Dr. Antonieta Pert office after getting blood  drawn  . Tick bite 08/12/2017   had 3 bites  . Vitamin D deficiency     Past Surgical History:  Procedure Laterality Date  . BREAST SURGERY     left breast lump--benign  . CARDIAC CATHETERIZATION  06/03/2005  . COLONOSCOPY  11/28/2001  . CORONARY ARTERY BYPASS GRAFT  2007   x2 Dr. Roxan Hockey, LIMA to LAD, SVG to PDA  . DILATATION & CURETTAGE/HYSTEROSCOPY WITH MYOSURE N/A 09/28/2017   Procedure: DILATATION & CURETTAGE/HYSTEROSCOPY WITH MYOSURE;  Surgeon: Molli Posey, MD;  Location: [facility];  Service: Gynecology;  Laterality: N/A;  . LIPOMA EXCISION     back  . UPPER GI ENDOSCOPY  01/20/1989    There were no vitals filed for this visit.   Subjective Assessment - 09/21/19 1304    Subjective COVID 19 screening performed on patient upon arrival. No complaints reported upon arrival.    Pertinent History MI, CABG, HTN, DM, OA, Osteoporosis    Limitations Lifting;Standing;Walking;House hold activities    How long can you stand comfortably? 2 minutes max and then she has to hold onto something    How long can you walk comfortably? Short distances only less than 3  minutes    Patient Stated Goals PT wants to be able to walk better and be more stable.  Currently in Pain? No/denies              [facility] PT Assessment - 09/21/19 0001      Assessment   Medical Diagnosis abnormality of gait with decreased balance     Referring Provider (PT) Melony Overly     Onset Date/Surgical Date 12/22/18    Hand Dominance Right    Next MD Visit N/A    Prior Therapy none      Precautions   Precautions Fall      Observation/Other Assessments   Focus on Therapeutic Outcomes (FOTO)  47% limitation, CK status                         [facility] Adult PT Treatment/Exercise - 09/21/19 0001      Ambulation/Gait   Ambulation Distance (Feet) 345 Feet    Assistive device Straight cane    Gait Pattern Within Functional Limits    Ambulation Surface Level;Indoor      Gait velocity x2 min for goal assessment       Standardized Balance Assessment   Standardized Balance Assessment Berg Balance Test;Timed Up and Go Test      Berg Balance Test   Sit to Stand Able to stand without using hands and stabilize independently    Standing Unsupported Able to stand safely 2 minutes    Sitting with Back Unsupported but Feet Supported on Floor or Stool Able to sit safely and securely 2 minutes    Stand to Sit Sits safely with minimal use of hands    Transfers Able to transfer safely, minor use of hands    Standing Unsupported with Eyes Closed Able to stand 10 seconds safely    Standing Ubsupported with Feet Together Able to place feet together independently and stand 1 minute safely    From Standing, Reach Forward with Outstretched Arm Can reach forward >12 cm safely (5")    From Standing Position, Pick up Object from Floor Able to pick up shoe safely and easily    From Standing Position, Turn to Look Behind Over each Shoulder Looks behind from both sides and weight shifts well    Turn 360 Degrees Able to turn 360 degrees safely one side only in 4 seconds or less    Standing Unsupported, Alternately Place Feet on Step/Stool Able to stand independently and safely and complete 8 steps in 20 seconds    Standing Unsupported, One Foot in Front Able to plae foot ahead of the other independently and hold 30 seconds    Standing on One Leg Able to lift leg independently and hold equal to or more than 3 seconds    Total Score 51      Timed Up and Go Test   TUG Normal TUG    Normal TUG (seconds) 14      Knee/Hip Exercises: Aerobic   Nustep L4 x15 min      Knee/Hip Exercises: Machines for Strengthening   Cybex Knee Extension 10# 3x10 reps    Cybex Knee Flexion 30# 3x10 reps      Knee/Hip Exercises: Standing   Other Standing Knee Exercises sidestepping red theraband x4 RT      Knee/Hip Exercises: Seated   Sit to Sand 15 reps;without UE support                     PT Short Term Goals - 09/21/19 1323      PT SHORT TERM GOAL #  1   Title PT LE strength to be incresed by 1/2 grade to allow pt to come sit to stand from a chair without using UE with ease.    Baseline 08/31/19 able to perfrom with no difficulty    Time 3    Period Weeks    Status Achieved      PT SHORT TERM GOAL #2   Title Pt to be able to single leg stance on both LE for 15 seconds to reduce risk of falling.    Time 3    Period Weeks    Status On-going      PT SHORT TERM GOAL #3   Title PT to be able to walk 300 ft in a 2 minute period with the use of her cane to improve functional mobility    Time 2    Period Weeks    Status Achieved             PT Long Term Goals - 09/21/19 1347      PT LONG TERM GOAL #1   Title Pt LE strength to improve one grade to be able to rise off of her couch without difficulty.    Time 6    Period Weeks    Status On-going      PT LONG TERM GOAL #2   Title Pt to have improved her foto score by 10 pt to demonstrate improved functional ability    Time 6    Period Weeks    Status Achieved      PT LONG TERM GOAL #3   Title Pt TUG to be less than 10 seconds for pt to feel confident getting up and answering the door.    Time 6    Period Weeks    Status On-going   14 sec TUG 09/21/2019     PT LONG TERM GOAL #4   Title PT to be I in walking with  a cane for 500 ft to improve I for short shopping trips    Time 6    Period Weeks    Status On-going                 Plan - 09/21/19 1404    Clinical Impression Statement Patient has presented in clinic with reports of increased strength with her ADLs and ambulation. Patient noted during gait goal assessment today that she had previously fell into the doorways when walking and turning corners at eval. Patient able to ambulate WNL and safely without bumping into doorways. Patient's BERG completed with 51/56 score and TUG as 14 seconds. Patient slightly distracted by busy  enviroment during TUG. Patient very challenged with SLS and tandem/semitandem stance.    Personal Factors and Comorbidities Age;Comorbidity 2    Comorbidities CABG, HTN, DM, OA, osteoporosis    Examination-Activity Limitations Bend;Carry;Dressing;Hygiene/Grooming;Lift;Locomotion Level;Stairs;Stand    Examination-Participation Restrictions Cleaning;Community Activity;Laundry;Shop;Meal Prep    Stability/Clinical Decision Making Stable/Uncomplicated    Rehab Potential Good    PT Frequency 2x / week    PT Duration 6 weeks    PT Treatment/Interventions Therapeutic activities;Therapeutic exercise;Balance training;Neuromuscular re-education;Patient/family education;Manual techniques    PT Next Visit Plan Continue functional strengthening and balance training.    PT Home Exercise Plan LAQ,SLR< bridge, Hip abduction    Consulted and Agree with Plan of Care Patient           Patient will benefit from skilled therapeutic intervention in order to improve the following deficits and impairments:  Abnormal gait,  Decreased activity tolerance, Decreased balance, Decreased strength, Difficulty walking  Visit Diagnosis: Other abnormalities of gait and mobility  Muscle weakness (generalized)     Problem List Patient Active Problem List   Diagnosis Date Noted  . Type 2 diabetes mellitus with hyperglycemia, without long-term current use of insulin ([place]) 01/27/2019  . Type 2 diabetes mellitus with stage 3b chronic kidney disease, without long-term current use of insulin ([place]) 01/27/2019  . Chronic renal insufficiency, stage 3 (moderate) 09/10/2017  . Pre-operative clearance 09/10/2017  . Chronic pain of both shoulders 04/27/2017  . Vitamin D deficiency 01/28/2016  . Non-insulin treated type 2 diabetes mellitus ([place]) 11/12/2015  . Statin intolerance 08/18/2013  . Neuromuscular disorder ([place])   . Hemochromatosis, hereditary ([place]) 11/24/2012  . S/P CABG x 2 08/09/2012  . PVC's (premature ventricular  contractions) 08/09/2012  . Gout 06/20/2012  . Hypertension associated with diabetes ([place]) 06/20/2012  . HLD (hyperlipidemia) 06/20/2012  . Arthritis 06/20/2012    Standley Brooking, PTA 09/21/2019, 6:04 PM  [facility] [address] [place], [place], [address] Phone: [phone]   Fax:  [phone]  Name: Carmen [name] MRN: [id number] Date of Birth: 1940-04-12

## 2019-09-28 ENCOUNTER — Ambulatory Visit: Payer: Medicare Other | Admitting: Physical Therapy

## 2019-09-28 ENCOUNTER — Other Ambulatory Visit: Payer: Self-pay

## 2019-09-28 DIAGNOSIS — M6281 Muscle weakness (generalized): Secondary | ICD-10-CM | POA: Diagnosis not present

## 2019-09-28 DIAGNOSIS — R2689 Other abnormalities of gait and mobility: Secondary | ICD-10-CM | POA: Diagnosis not present

## 2019-09-28 NOTE — Therapy (Signed)
[facility] [place], [place], [address] Phone: [phone]   Fax:  [phone]  Physical Therapy Treatment  Patient Details  Name: Carmen [name] MRN: [id number] Date of Birth: 05/06/1940 Referring Provider (PT): Melony Overly    Encounter Date: 09/28/2019   PT End of Session - 09/28/19 1402    Visit Number 11    Number of Visits 18    Date for PT Re-Evaluation 10/20/19    Authorization Type [employer] MEDI[employer]; Progress note every 10th visit; KX modifier at 15th visit    PT Start Time 0146    PT Stop Time 0227    PT Time Calculation (min) 41 min    Activity Tolerance Patient tolerated treatment well    Behavior During Therapy [facility] for tasks assessed/performed           Past Medical History:  Diagnosis Date  . Arthritis   . Asymptomatic PVCs   . Bilateral shoulder pain   . CAD (coronary artery disease)   . Cancer ([place]) 11/2015   melanomax4  right upper arm  . Chronic renal insufficiency, stage 3 (moderate)   . Diabetes mellitus without complication ([place])   . Diverticulosis   . Endometrial polyp   . External hemorrhoids   . Gout   . Hemochromatosis, hereditary ([place]) 11/24/2012  . History of anemia   . History of duodenal ulcer 1990  . Hyperlipidemia   . Hypertension   . Internal hemorrhoids   . Jaundice    age 24 or 44  . Myocardial infarction ([place]) 2007  . Neuromuscular disorder ([place])    peripheral neuropathy  . PMB (postmenopausal bleeding)   . PONV (postoperative nausea and vomiting)   . Prolapse of female pelvic organs    uses pessary  . Seizures ([place])    had one at Dr. Antonieta Pert office after getting blood drawn  . Tick bite 08/12/2017   had 3 bites  . Vitamin D deficiency     Past Surgical History:  Procedure Laterality Date  . BREAST SURGERY     left breast lump--benign  . CARDIAC CATHETERIZATION  06/03/2005  . COLONOSCOPY  11/28/2001  . CORONARY ARTERY BYPASS GRAFT  2007   x2 Dr.  Roxan Hockey, LIMA to LAD, SVG to PDA  . DILATATION & CURETTAGE/HYSTEROSCOPY WITH MYOSURE N/A 09/28/2017   Procedure: DILATATION & CURETTAGE/HYSTEROSCOPY WITH MYOSURE;  Surgeon: Molli Posey, MD;  Location: [facility];  Service: Gynecology;  Laterality: N/A;  . LIPOMA EXCISION     back  . UPPER GI ENDOSCOPY  01/20/1989    There were no vitals filed for this visit.   Subjective Assessment - 09/28/19 1357    Subjective COVID 19 screening performed on patient upon arrival. No complaints reported upon arrival.    Pertinent History MI, CABG, HTN, DM, OA, Osteoporosis    Limitations Lifting;Standing;Walking;House hold activities    How long can you stand comfortably? 2 minutes max and then she has to hold onto something    How long can you walk comfortably? Short distances only less than 3  minutes    Patient Stated Goals PT wants to be able to walk better and be more stable.    Currently in Pain? No/denies                             [facility] Adult PT Treatment/Exercise - 09/28/19 0001      Knee/Hip Exercises: Aerobic  Nustep L5 x15 min      Knee/Hip Exercises: Machines for Strengthening   Cybex Knee Extension 10# 3x10 reps    Cybex Knee Flexion 30# 3x10 reps               Balance Exercises - 09/28/19 0001      Balance Exercises: Standing   Standing Eyes Opened Wide (BOA);Narrow base of support (BOS);Foam/compliant surface;Time   x80min   SLS Eyes open;Intermittent upper extremity support;Time;Solid surface   attempted bil LE and 3 sec on both   Marching Solid surface;Forwards   2x10 with no LE assist in parallel bars for safety/ SBA   Heel Raises Both;20 reps    Toe Raise Both;20 reps    Sit to Stand Standard surface   10-15 int UE support   Other Standing Exercises resisted walking forward/retro/side /side with green XTS               PT Short Term Goals - 09/28/19 1404      PT SHORT TERM GOAL #1   Title PT LE strength to be  incresed by 1/2 grade to allow pt to come sit to stand from a chair without using UE with ease.    Baseline 08/31/19 able to perfrom with no difficulty    Time 3    Period Weeks    Status Achieved      PT SHORT TERM GOAL #2   Title Pt to be able to single leg stance on both LE for 15 seconds to reduce risk of falling.    Baseline 3sec bil LE 09/28/19    Time 3    Period Weeks    Status On-going             PT Long Term Goals - 09/28/19 1405      PT LONG TERM GOAL #1   Title Pt LE strength to improve one grade to be able to rise off of her couch without difficulty.    Time 6    Period Weeks    Status On-going      PT LONG TERM GOAL #2   Title Pt to have improved her foto score by 10 pt to demonstrate improved functional ability    Time 6    Period Weeks    Status Achieved      PT LONG TERM GOAL #3   Title Pt TUG to be less than 10 seconds for pt to feel confident getting up and answering the door.    Time 6    Period Weeks    Status On-going      PT LONG TERM GOAL #4   Title PT to be I in walking with  a cane for 500 ft to improve I for short shopping trips    Time 6    Period Weeks    Status On-going                 Plan - 09/28/19 1416    Clinical Impression Statement Patient tolerated treatment well today. Patient able to progress with balance activities and was able to perform resisited walking with a few LOB yet able to recover with min assist. Patient reported doing HEP with no difficulty. Patient only able to perfrom SLS no more than 3 seconds on bil LE. Goals progressing today.    Personal Factors and Comorbidities Age;Comorbidity 2    Comorbidities CABG, HTN, DM, OA, osteoporosis    Examination-Activity Limitations Bend;Carry;Dressing;Hygiene/Grooming;Lift;Locomotion Level;Stairs;Stand  Examination-Participation Restrictions Cleaning;Community Activity;Laundry;Shop;Meal Prep    Stability/Clinical Decision Making Stable/Uncomplicated    Rehab  Potential Good    PT Frequency 2x / week    PT Duration 6 weeks    PT Treatment/Interventions Therapeutic activities;Therapeutic exercise;Balance training;Neuromuscular re-education;Patient/family education;Manual techniques    PT Next Visit Plan Continue functional strengthening and balance training and progress as tolerated    Consulted and Agree with Plan of Care Patient           Patient will benefit from skilled therapeutic intervention in order to improve the following deficits and impairments:  Abnormal gait, Decreased activity tolerance, Decreased balance, Decreased strength, Difficulty walking  Visit Diagnosis: Other abnormalities of gait and mobility  Muscle weakness (generalized)     Problem List Patient Active Problem List   Diagnosis Date Noted  . Type 2 diabetes mellitus with hyperglycemia, without long-term current use of insulin ([place]) 01/27/2019  . Type 2 diabetes mellitus with stage 3b chronic kidney disease, without long-term current use of insulin ([place]) 01/27/2019  . Chronic renal insufficiency, stage 3 (moderate) 09/10/2017  . Pre-operative clearance 09/10/2017  . Chronic pain of both shoulders 04/27/2017  . Vitamin D deficiency 01/28/2016  . Non-insulin treated type 2 diabetes mellitus ([place]) 11/12/2015  . Statin intolerance 08/18/2013  . Neuromuscular disorder ([place])   . Hemochromatosis, hereditary ([place]) 11/24/2012  . S/P CABG x 2 08/09/2012  . PVC's (premature ventricular contractions) 08/09/2012  . Gout 06/20/2012  . Hypertension associated with diabetes ([place]) 06/20/2012  . HLD (hyperlipidemia) 06/20/2012  . Arthritis 06/20/2012    Phillips Climes, PTA 09/28/2019, 2:34 PM  [facility] [address] [place], [place], [address] Phone: [phone]   Fax:  [phone]  Name: Carmen [name] MRN: [id number] Date of Birth: [birth year]

## 2019-10-03 ENCOUNTER — Ambulatory Visit: Payer: Medicare Other | Admitting: Physical Therapy

## 2019-10-03 ENCOUNTER — Encounter: Payer: Self-pay | Admitting: Physical Therapy

## 2019-10-03 ENCOUNTER — Other Ambulatory Visit: Payer: Self-pay

## 2019-10-03 ENCOUNTER — Ambulatory Visit (INDEPENDENT_AMBULATORY_CARE_PROVIDER_SITE_OTHER): Payer: Medicare Other | Admitting: Licensed Clinical Social Worker

## 2019-10-03 DIAGNOSIS — I10 Essential (primary) hypertension: Secondary | ICD-10-CM

## 2019-10-03 DIAGNOSIS — E1159 Type 2 diabetes mellitus with other circulatory complications: Secondary | ICD-10-CM | POA: Diagnosis not present

## 2019-10-03 DIAGNOSIS — Z951 Presence of aortocoronary bypass graft: Secondary | ICD-10-CM

## 2019-10-03 DIAGNOSIS — E782 Mixed hyperlipidemia: Secondary | ICD-10-CM

## 2019-10-03 DIAGNOSIS — E559 Vitamin D deficiency, unspecified: Secondary | ICD-10-CM

## 2019-10-03 DIAGNOSIS — N183 Chronic kidney disease, stage 3 unspecified: Secondary | ICD-10-CM | POA: Diagnosis not present

## 2019-10-03 DIAGNOSIS — M1990 Unspecified osteoarthritis, unspecified site: Secondary | ICD-10-CM

## 2019-10-03 DIAGNOSIS — M6281 Muscle weakness (generalized): Secondary | ICD-10-CM

## 2019-10-03 DIAGNOSIS — R2689 Other abnormalities of gait and mobility: Secondary | ICD-10-CM

## 2019-10-03 DIAGNOSIS — I152 Hypertension secondary to endocrine disorders: Secondary | ICD-10-CM

## 2019-10-03 NOTE — Therapy (Signed)
[facility] [place], [place], [address] Phone: [phone]   Fax:  [phone]  Physical Therapy Treatment  Patient Details  Name: Carmen [name] MRN: [id number] Date of Birth: 04-22-1940 Referring Provider (PT): Melony Overly    Encounter Date: 10/03/2019   PT End of Session - 10/03/19 1604    Visit Number 12    Number of Visits 18    Date for PT Re-Evaluation 10/20/19    Authorization Type [employer] MEDIcare; Progress note every 10th visit; KX modifier at 15th visit    PT Start Time 1517    PT Stop Time 1601    PT Time Calculation (min) 44 min    Activity Tolerance Patient tolerated treatment well    Behavior During Therapy [facility] for tasks assessed/performed           Past Medical History:  Diagnosis Date  . Arthritis   . Asymptomatic PVCs   . Bilateral shoulder pain   . CAD (coronary artery disease)   . Cancer ([place]) 11/2015   melanomax4  right upper arm  . Chronic renal insufficiency, stage 3 (moderate)   . Diabetes mellitus without complication ([place])   . Diverticulosis   . Endometrial polyp   . External hemorrhoids   . Gout   . Hemochromatosis, hereditary ([place]) 11/24/2012  . History of anemia   . History of duodenal ulcer 1990  . Hyperlipidemia   . Hypertension   . Internal hemorrhoids   . Jaundice    age 43 or 38  . Myocardial infarction ([place]) 2007  . Neuromuscular disorder ([place])    peripheral neuropathy  . PMB (postmenopausal bleeding)   . PONV (postoperative nausea and vomiting)   . Prolapse of female pelvic organs    uses pessary  . Seizures ([place])    had one at Dr. Antonieta Pert office after getting blood drawn  . Tick bite 08/12/2017   had 3 bites  . Vitamin D deficiency     Past Surgical History:  Procedure Laterality Date  . BREAST SURGERY     left breast lump--benign  . CARDIAC CATHETERIZATION  06/03/2005  . COLONOSCOPY  11/28/2001  . CORONARY ARTERY BYPASS GRAFT  2007   x2 Dr.  Roxan Hockey, LIMA to LAD, SVG to PDA  . DILATATION & CURETTAGE/HYSTEROSCOPY WITH MYOSURE N/A 09/28/2017   Procedure: DILATATION & CURETTAGE/HYSTEROSCOPY WITH MYOSURE;  Surgeon: Molli Posey, MD;  Location: [facility];  Service: Gynecology;  Laterality: N/A;  . LIPOMA EXCISION     back  . UPPER GI ENDOSCOPY  01/20/1989    There were no vitals filed for this visit.   Subjective Assessment - 10/03/19 1539    Subjective COVID 19 screening performed on patient upon arrival. Patient reports feeling a little tired due to cleaning earlier this afternoon.    Pertinent History MI, CABG, HTN, DM, OA, Osteoporosis    Limitations Lifting;Standing;Walking;House hold activities    How long can you stand comfortably? 2 minutes max and then she has to hold onto something    How long can you walk comfortably? Short distances only less than 3  minutes    Patient Stated Goals PT wants to be able to walk better and be more stable.    Currently in Pain? No/denies              [facility] PT Assessment - 10/03/19 0001      Assessment   Medical Diagnosis abnormality of gait with decreased balance  Referring Provider (PT) Melony Overly     Onset Date/Surgical Date 12/22/18    Hand Dominance Right    Next MD Visit N/A    Prior Therapy none      Precautions   Precautions Fall                         [facility] Adult PT Treatment/Exercise - 10/03/19 0001      Transfers   Five time sit to stand comments  21.27 seconds without UE support      Knee/Hip Exercises: Aerobic   Nustep L5 x15 min      Knee/Hip Exercises: Machines for Strengthening   Cybex Knee Extension 10# x2 minutes    Cybex Knee Flexion 30# x2 minutes               Balance Exercises - 10/03/19 0001      Balance Exercises: Standing   SLS Eyes open;Intermittent upper extremity support;Time;Solid surface;2 reps   2x1 min bilaterally; kickstand position   Step Ups Forward;Intermittent UE support;6  inch   step taps 3x10   Balance Beam lateral stepping with 1 UE support x3 minutes    Heel Raises Both;20 reps   no UE support   Toe Raise Both;20 reps   1 UE support              PT Short Term Goals - 09/28/19 1404      PT SHORT TERM GOAL #1   Title PT LE strength to be incresed by 1/2 grade to allow pt to come sit to stand from a chair without using UE with ease.    Baseline 08/31/19 able to perfrom with no difficulty    Time 3    Period Weeks    Status Achieved      PT SHORT TERM GOAL #2   Title Pt to be able to single leg stance on both LE for 15 seconds to reduce risk of falling.    Baseline 3sec bil LE 09/28/19    Time 3    Period Weeks    Status On-going             PT Long Term Goals - 09/28/19 1405      PT LONG TERM GOAL #1   Title Pt LE strength to improve one grade to be able to rise off of her couch without difficulty.    Time 6    Period Weeks    Status On-going      PT LONG TERM GOAL #2   Title Pt to have improved her foto score by 10 pt to demonstrate improved functional ability    Time 6    Period Weeks    Status Achieved      PT LONG TERM GOAL #3   Title Pt TUG to be less than 10 seconds for pt to feel confident getting up and answering the door.    Time 6    Period Weeks    Status On-going      PT LONG TERM GOAL #4   Title PT to be I in walking with  a cane for 500 ft to improve I for short shopping trips    Time 6    Period Weeks    Status On-going                 Plan - 10/03/19 1753    Clinical Impression Statement Patient responded well to therapy  session today. Excellent use of ankle and hip strategies noted throughout balance activities. Patient still with difficulties with SLS therefore exercise regressed to kickstand position with improved balance and strong use of ankle strategies and only one instance of UE support.    Personal Factors and Comorbidities Age;Comorbidity 2    Comorbidities CABG, HTN, DM, OA, osteoporosis      Examination-Activity Limitations Bend;Carry;Dressing;Hygiene/Grooming;Lift;Locomotion Level;Stairs;Stand    Examination-Participation Restrictions Cleaning;Community Activity;Laundry;Shop;Meal Prep    Stability/Clinical Decision Making Stable/Uncomplicated    Clinical Decision Making Low    Rehab Potential Good    PT Frequency 2x / week    PT Duration 6 weeks    PT Treatment/Interventions Therapeutic activities;Therapeutic exercise;Balance training;Neuromuscular re-education;Patient/family education;Manual techniques    PT Next Visit Plan Continue functional strengthening and balance training and progress as tolerated    PT Home Exercise Plan LAQ,SLR< bridge, Hip abduction    Consulted and Agree with Plan of Care Patient           Patient will benefit from skilled therapeutic intervention in order to improve the following deficits and impairments:  Abnormal gait, Decreased activity tolerance, Decreased balance, Decreased strength, Difficulty walking  Visit Diagnosis: Other abnormalities of gait and mobility  Muscle weakness (generalized)     Problem List Patient Active Problem List   Diagnosis Date Noted  . Type 2 diabetes mellitus with hyperglycemia, without long-term current use of insulin ([place]) 01/27/2019  . Type 2 diabetes mellitus with stage 3b chronic kidney disease, without long-term current use of insulin ([place]) 01/27/2019  . Chronic renal insufficiency, stage 3 (moderate) 09/10/2017  . Pre-operative clearance 09/10/2017  . Chronic pain of both shoulders 04/27/2017  . Vitamin D deficiency 01/28/2016  . Non-insulin treated type 2 diabetes mellitus ([place]) 11/12/2015  . Statin intolerance 08/18/2013  . Neuromuscular disorder ([place])   . Hemochromatosis, hereditary ([place]) 11/24/2012  . S/P CABG x 2 08/09/2012  . PVC's (premature ventricular contractions) 08/09/2012  . Gout 06/20/2012  . Hypertension associated with diabetes ([place]) 06/20/2012  . HLD (hyperlipidemia)  06/20/2012  . Arthritis 06/20/2012    Gabriela Eves, PT, DPT 10/03/2019, 7:02 PM  [facility] [address] [place], [place], [address] Phone: [phone]   Fax:  [phone]  Name: Carmen [name] MRN: [id number] Date of Birth: 1941/02/07

## 2019-10-03 NOTE — Chronic Care Management (AMB) (Signed)
Chronic Care Management    Clinical Social Work Follow Up Note  10/03/2019 Name: Carmen [name] MRN: [id number] DOB: [birth year]  Carmen [name] is a 79 y.o. year old female who is a primary care patient of Stacks, Cletus Gash, MD. The CCM team was consulted for assistance with [employer] .   Review of patient status, including review of consultants reports, other relevant assessments, and collaboration with appropriate care team members and the patient's provider was performed as part of comprehensive patient evaluation and provision of chronic care management services.    SDOH (Social Determinants of Health) assessments performed: No;risk for social isolation; risk for tobacco use; risk for depression; risk for physical inactivity     Chronic Care Management from 08/29/2019 in [facility]  PHQ-9 Total Score 4      GAD 7 : Generalized Anxiety Score 08/29/2019  Nervous, Anxious, on Edge 0  Control/stop worrying 1  Worry too much - different things 0  Trouble relaxing 1  Restless 0  Easily annoyed or irritable 0  Afraid - awful might happen 0  Total GAD 7 Score 2  Anxiety Difficulty Somewhat difficult   Outpatient Encounter Medications as of 10/03/2019  Medication Sig  . Alirocumab (PRALUENT) 150 MG/ML SOAJ Inject 1 Dose into the skin every 14 (fourteen) days.  Marland Kitchen allopurinol (ZYLOPRIM) 100 MG tablet TAKE  (1)  TABLET TWICE A DAY.  Marland Kitchen aspirin 81 MG tablet Take 81 mg by mouth daily.  . colchicine 0.6 MG tablet TAKE (1) TABLET DAILY AS NEEDED.  Marland Kitchen diclofenac Sodium (VOLTAREN) 1 % GEL APPLY 4 GRAMS TO AFFECTED AREA(S) 4 TIMES A DAY AS NEEDED  . fluticasone (FLONASE) 50 MCG/ACT nasal spray SPRAY 1 SPRAY IN EACH NOSTRIL ONCE DAILY.  . furosemide (LASIX) 20 MG tablet TAKE 1 TABLET DAILY  . glipiZIDE (GLUCOTROL) 10 MG tablet TAKE 2 TABLETS BEFORE BREAKFAST AND 1 TABLET DAILY BEFORE SUPPER  . glucose blood (ONE TOUCH ULTRA TEST) test strip CHECK BLOOD SUGAR 2  TIMES A DAY  . icosapent Ethyl (VASCEPA) 1 g capsule Take 2 capsules (2 g total) by mouth 2 (two) times daily.  . Lancets Misc. (ACCU-CHEK FASTCLIX LANCET) KIT Use to check Blood Sugars  . meclizine (ANTIVERT) 12.5 MG tablet Take 1 tablet (12.5 mg total) by mouth 3 (three) times daily as needed for dizziness.  . metoprolol tartrate (LOPRESSOR) 25 MG tablet TAKE  (1)  TABLET TWICE A DAY.  Marland Kitchen olmesartan (BENICAR) 20 MG tablet TAKE 1 TABLET DAILY  . pregabalin (LYRICA) 50 MG capsule TAKE  (1)  CAPSULE  TWICE DAILY.  Marland Kitchen RESTASIS 0.05 % ophthalmic emulsion   . Semaglutide (RYBELSUS) 7 MG TABS Take 7 mg by mouth daily before breakfast.  . Vitamin D, Ergocalciferol, (DRISDOL) 1.25 MG (50000 UT) CAPS capsule Take 1 capsule (50,000 Units total) by mouth 2 (two) times a week.  . VOLTAREN 1 % GEL APPLY 4 GRAMS TO AFFECTED AREA(S) 4 TIMES A DAY AS NEEDED   No facility-administered encounter medications on file as of 10/03/2019.    Goals    .  Client will call LCSW in next 30 days to talk about her management of medical condtions and about her completion of ADLs (pt-stated)      CARE PLAN ENTRY   Current Barriers:  . Vertigo issues in client with chronic diagnoses of HTN, HLD, Arthritis, S/P CABG X 2, Viatmin D Deficiency, CKD . Mobility issues  Clinical Social Work Clinical Goal(s):  .  LCSW to call client in next 30 days to talk with her about management of her medical condition and her completion of daily ADLs  Interventions: . Talked with client about CCM program support . Talked with client about RNCM support with CCM program . Talked with client about pain issues of client . Talked with client about social support network of client (has  support from daughter) . Talked with client about physical therapy sessions of client  . Talked with client about ambulation needs of client (uses a cane to help her walk) . Talked with client about DME of client (has a tub seat, hand held shower, grab bars,  low step shower) . Talked with client about vision challenges of client  . Talked with client about her recent visit with endocrinologist . Talked with client about relaxation activities (likes to read and sit on front porch, likes to work with flowers/roses) . Talked with client about her upcoming client appointments  . Talked with client about her meals provision   Patient Self Care Activities:  Does ADLs independently Attends scheduled medical appointments Drives to appointments and drives to complete errands  Patient Self Care Deficits:  . Balance issues . Ambulation issues . Vertigo issues  Initial goal documentation    Follow Up Plan: LCSW to call client in next 4 weeks to talk with client about her management of medical issues and about her completion of ADLs  Norva Riffle. MSW, LCSW Licensed Clinical Social Worker [facility] Care Management [phone]

## 2019-10-03 NOTE — Patient Instructions (Addendum)
Licensed Clinical Education officer, museum Visit Information  Goals we discussed today:    Client will call LCSW in next 30 days to talk about her management of medical condtions and about her completion of ADLs (pt-stated)         CARE PLAN ENTRY   Current Barriers:   Vertigo issues in client with chronic diagnoses of HTN, HLD, Arthritis, S/P CABG X 2, Viatmin D Deficiency, CKD  Mobility issues  Clinical Social Work Clinical Goal(s):   LCSW to call client in next 30 days to talk with her about management of her medical condition and her completion of daily ADLs  Interventions:  Talked with client about CCM program support  Talked with client about RNCM support with CCM program  Talked with client about pain issues of client  Talked with client about social support network of client (has  support from daughter)  Talked with client about physical therapy sessions of client   Talked with client about ambulation needs of client (uses a cane to help her walk)  Talked with client about DME of client (has a tub seat, hand held shower, grab bars, low step shower)  Talked with client about vision challenges of client   Talked with client about her recent visit with endocrinologist  Talked with client about relaxation activities (likes to read and sit on front porch, likes to work with flowers/roses)  Talked with client about her upcoming client appointments   Talked with client about her meals provision   Patient Self Care Activities:  Does ADLs independently Attends scheduled medical appointments Drives to appointments and drives to complete errands  Patient Self Care Deficits:   Balance issues  Ambulation issues  Vertigo issues  Initial goal documentation    Follow Up Plan: LCSW to call client in next 4 weeks to talk with client about her management of medical issues and about her completion of ADLs  Materials Provided: No  The patient verbalized  understanding of instructions provided today and declined a print copy of patient instruction materials.   Norva Riffle.Maher Shon MSW, LCSW Licensed Clinical Social Worker [facility] Care [facility] [phone]

## 2019-10-04 ENCOUNTER — Other Ambulatory Visit: Payer: Self-pay | Admitting: Family Medicine

## 2019-10-05 ENCOUNTER — Other Ambulatory Visit: Payer: Self-pay

## 2019-10-05 ENCOUNTER — Encounter: Payer: Self-pay | Admitting: Physical Therapy

## 2019-10-05 ENCOUNTER — Ambulatory Visit: Payer: Medicare Other | Admitting: Physical Therapy

## 2019-10-05 DIAGNOSIS — M6281 Muscle weakness (generalized): Secondary | ICD-10-CM

## 2019-10-05 DIAGNOSIS — R2689 Other abnormalities of gait and mobility: Secondary | ICD-10-CM

## 2019-10-05 NOTE — Therapy (Signed)
[facility] [place], [place], [address] Phone: [phone]   Fax:  [phone]  Physical Therapy Treatment  Patient Details  Name: Carmen [name] MRN: [id number] Date of Birth: [birth year] Referring Provider (PT): Melony Overly    Encounter Date: 10/05/2019   PT End of Session - 10/05/19 1207    Visit Number 13    Number of Visits 18    Date for PT Re-Evaluation 10/20/19    Authorization Type [employer]; Progress note every 10th visit; KX modifier at 15th visit    PT Start Time 1200    PT Stop Time 1240    PT Time Calculation (min) 40 min    Equipment Utilized During Treatment Other (comment)    Activity Tolerance Patient tolerated treatment well    Behavior During Therapy [facility] for tasks assessed/performed           Past Medical History:  Diagnosis Date  . Arthritis   . Asymptomatic PVCs   . Bilateral shoulder pain   . CAD (coronary artery disease)   . Cancer ([place]) 11/2015   melanomax4  right upper arm  . Chronic renal insufficiency, stage 3 (moderate)   . Diabetes mellitus without complication ([place])   . Diverticulosis   . Endometrial polyp   . External hemorrhoids   . Gout   . Hemochromatosis, hereditary ([place]) 11/24/2012  . History of anemia   . History of duodenal ulcer 1990  . Hyperlipidemia   . Hypertension   . Internal hemorrhoids   . Jaundice    age 55 or 73  . Myocardial infarction ([place]) 2007  . Neuromuscular disorder ([place])    peripheral neuropathy  . PMB (postmenopausal bleeding)   . PONV (postoperative nausea and vomiting)   . Prolapse of female pelvic organs    uses pessary  . Seizures ([place])    had one at Dr. Antonieta Pert office after getting blood drawn  . Tick bite 08/12/2017   had 3 bites  . Vitamin D deficiency     Past Surgical History:  Procedure Laterality Date  . BREAST SURGERY     left breast lump--benign  . CARDIAC CATHETERIZATION  06/03/2005  . COLONOSCOPY   11/28/2001  . CORONARY ARTERY BYPASS GRAFT  2007   x2 Dr. Roxan Hockey, LIMA to LAD, SVG to PDA  . DILATATION & CURETTAGE/HYSTEROSCOPY WITH MYOSURE N/A 09/28/2017   Procedure: DILATATION & CURETTAGE/HYSTEROSCOPY WITH MYOSURE;  Surgeon: Molli Posey, MD;  Location: [facility];  Service: Gynecology;  Laterality: N/A;  . LIPOMA EXCISION     back  . UPPER GI ENDOSCOPY  01/20/1989    There were no vitals filed for this visit.   Subjective Assessment - 10/05/19 1205    Subjective COVID 19 screening performed on patient upon arrival. No pain reported.    Pertinent History MI, CABG, HTN, DM, OA, Osteoporosis    Limitations Lifting;Standing;Walking;House hold activities    How long can you stand comfortably? 2 minutes max and then she has to hold onto something    How long can you walk comfortably? Short distances only less than 3  minutes    Patient Stated Goals PT wants to be able to walk better and be more stable.    Currently in Pain? No/denies                             [facility] Adult PT Treatment/Exercise - 10/05/19 0001  Knee/Hip Exercises: Aerobic   Nustep L4 x 10 minutes      Knee/Hip Exercises: Machines for Strengthening   Cybex Knee Extension 10# x2 minutes    Cybex Knee Flexion 30# x2 minutes      Knee/Hip Exercises: Standing   Heel Raises Both;15 reps    Hip Flexion Stengthening;Both;15 reps;Knee bent    Hip Flexion Limitations UE support    Hip Abduction Stengthening;Both;15 reps;Knee straight;Limitations    Abduction Limitations UE support    Lateral Step Up Both;10 reps;Hand Hold: 1;Step Height: 4"    Forward Step Up Both;10 reps;Hand Hold: 1;Step Height: 4"    Rocker Board 2 minutes    SLS Best time out of 5 trials: R: 5 seconds, L 2 seconds    Other Standing Knee Exercises side stepping, amb with increased cadance, backward walking, all with CGA    Other Standing Knee Exercises small lunging forward with CGA with decreased  balance laterally.       Knee/Hip Exercises: Seated   Long Probation officer;Both    Other Seated Knee/Hip Exercises trunk flexion using blue physiball x 10, rolling ball from side to side for lateral flexion and balance /trunk control x 10.     Marching Strengthening;Both;10 reps    Hamstring Curl Strengthening;Both;10 reps;Limitations    Hamstring Limitations red theraband     Sit to Sand 10 reps;without UE support               Balance Exercises - 10/05/19 0001      Balance Exercises: Standing   SLS Eyes open;Intermittent upper extremity support;Time;Solid surface;2 reps   2x1 min bilaterally; kickstand position   Step Ups Forward;Intermittent UE support;6 inch   step taps 3x10   Balance Beam lateral stepping with 1 UE support x3 minutes    Heel Raises Both;20 reps   no UE support   Toe Raise Both;20 reps   1 UE support   Other Standing Exercises tapping balance pods x 20 reps with CGA              PT Education - 10/05/19 1206    Education Details exercise technique    Person(s) Educated Patient    Methods Explanation;Demonstration    Comprehension Verbalized understanding;Returned demonstration            PT Short Term Goals - 09/28/19 1404      PT SHORT TERM GOAL #1   Title PT LE strength to be incresed by 1/2 grade to allow pt to come sit to stand from a chair without using UE with ease.    Baseline 08/31/19 able to perfrom with no difficulty    Time 3    Period Weeks    Status Achieved      PT SHORT TERM GOAL #2   Title Pt to be able to single leg stance on both LE for 15 seconds to reduce risk of falling.    Baseline 3sec bil LE 09/28/19    Time 3    Period Weeks    Status On-going             PT Long Term Goals - 10/05/19 1208      PT LONG TERM GOAL #1   Title Pt LE strength to improve one grade to be able to rise off of her couch without difficulty.    Baseline -    Status On-going      PT LONG TERM GOAL #2   Title Pt to  have improved  her foto score by 10 pt to demonstrate improved functional ability    Baseline -    Status Achieved      PT LONG TERM GOAL #3   Title Pt TUG to be less than 10 seconds for pt to feel confident getting up and answering the door.    Baseline -    Period Weeks    Status On-going      PT LONG TERM GOAL #4   Title PT to be I in walking with  a cane for 500 ft to improve I for short shopping trips    Status On-going                 Plan - 10/05/19 1209    Clinical Impression Statement Pt arriving to therapy reporting no pain. Pt tolerating treatment well with no increase in pain reported. Pt was instructed on proper exercise techniques and core activation durin SLS balance stradgies. Continue skilled PT.    Personal Factors and Comorbidities Age;Comorbidity 2    Comorbidities CABG, HTN, DM, OA, osteoporosis    Examination-Activity Limitations Bend;Carry;Dressing;Hygiene/Grooming;Lift;Locomotion Level;Stairs;Stand    Examination-Participation Restrictions Cleaning;Community Activity;Laundry;Shop;Meal Prep    Stability/Clinical Decision Making Stable/Uncomplicated    Rehab Potential Good    PT Frequency 2x / week    PT Treatment/Interventions Therapeutic activities;Therapeutic exercise;Balance training;Neuromuscular re-education;Patient/family education;Manual techniques    PT Next Visit Plan Continue functional strengthening and balance training and progress as tolerated    PT Home Exercise Plan LAQ,SLR< bridge, Hip abduction           Patient will benefit from skilled therapeutic intervention in order to improve the following deficits and impairments:  Abnormal gait, Decreased activity tolerance, Decreased balance, Decreased strength, Difficulty walking  Visit Diagnosis: Other abnormalities of gait and mobility  Muscle weakness (generalized)     Problem List Patient Active Problem List   Diagnosis Date Noted  . Type 2 diabetes mellitus with hyperglycemia, without  long-term current use of insulin ([place]) 01/27/2019  . Type 2 diabetes mellitus with stage 3b chronic kidney disease, without long-term current use of insulin ([place]) 01/27/2019  . Chronic renal insufficiency, stage 3 (moderate) 09/10/2017  . Pre-operative clearance 09/10/2017  . Chronic pain of both shoulders 04/27/2017  . Vitamin D deficiency 01/28/2016  . Non-insulin treated type 2 diabetes mellitus ([place]) 11/12/2015  . Statin intolerance 08/18/2013  . Neuromuscular disorder ([place])   . Hemochromatosis, hereditary ([place]) 11/24/2012  . S/P CABG x 2 08/09/2012  . PVC's (premature ventricular contractions) 08/09/2012  . Gout 06/20/2012  . Hypertension associated with diabetes ([place]) 06/20/2012  . HLD (hyperlipidemia) 06/20/2012  . Arthritis 06/20/2012    Oretha Caprice, PT, MPT 10/05/2019, 12:53 PM  [facility] [place], [place], [address] Phone: [phone]   Fax:  [phone]  Name: DELROSE ROHWER MRN: [id number] Date of Birth: 08/15/1940

## 2019-10-09 ENCOUNTER — Other Ambulatory Visit: Payer: Self-pay | Admitting: Internal Medicine

## 2019-10-10 ENCOUNTER — Encounter: Payer: Self-pay | Admitting: Physical Therapy

## 2019-10-10 ENCOUNTER — Ambulatory Visit: Payer: Medicare Other | Admitting: Physical Therapy

## 2019-10-10 ENCOUNTER — Other Ambulatory Visit: Payer: Self-pay

## 2019-10-10 DIAGNOSIS — R2689 Other abnormalities of gait and mobility: Secondary | ICD-10-CM

## 2019-10-10 DIAGNOSIS — M6281 Muscle weakness (generalized): Secondary | ICD-10-CM

## 2019-10-10 NOTE — Therapy (Signed)
[facility], [place], [address] Phone: [phone]   Fax:  [phone]  Physical Therapy Treatment  Patient Details  Name: Carmen [name] MRN: [id number] Date of Birth: 12/29/1940 Referring Provider (PT): Melony Overly    Encounter Date: 10/10/2019   PT End of Session - 10/10/19 1524    Visit Number 14    Number of Visits 18    Date for PT Re-Evaluation 10/20/19    Authorization Type [employer] MEDI[employer]; Progress note every 10th visit; KX modifier at 15th visit    PT Start Time 1430    PT Stop Time 1516    PT Time Calculation (min) 46 min    Activity Tolerance Patient tolerated treatment well    Behavior During Therapy [facility] for tasks assessed/performed           Past Medical History:  Diagnosis Date  . Arthritis   . Asymptomatic PVCs   . Bilateral shoulder pain   . CAD (coronary artery disease)   . Cancer ([place]) 11/2015   melanomax4  right upper arm  . Chronic renal insufficiency, stage 3 (moderate)   . Diabetes mellitus without complication ([place])   . Diverticulosis   . Endometrial polyp   . External hemorrhoids   . Gout   . Hemochromatosis, hereditary ([place]) 11/24/2012  . History of anemia   . History of duodenal ulcer 1990  . Hyperlipidemia   . Hypertension   . Internal hemorrhoids   . Jaundice    age 79 or 79  . Myocardial infarction ([place]) 2007  . Neuromuscular disorder ([place])    peripheral neuropathy  . PMB (postmenopausal bleeding)   . PONV (postoperative nausea and vomiting)   . Prolapse of female pelvic organs    uses pessary  . Seizures ([place])    had one at Dr. Antonieta Pert office after getting blood drawn  . Tick bite 08/12/2017   had 3 bites  . Vitamin D deficiency     Past Surgical History:  Procedure Laterality Date  . BREAST SURGERY     left breast lump--benign  . CARDIAC CATHETERIZATION  06/03/2005  . COLONOSCOPY  11/28/2001  . CORONARY ARTERY BYPASS GRAFT  2007   x2 Dr.  Roxan Hockey, LIMA to LAD, SVG to PDA  . DILATATION & CURETTAGE/HYSTEROSCOPY WITH MYOSURE N/A 09/28/2017   Procedure: DILATATION & CURETTAGE/HYSTEROSCOPY WITH MYOSURE;  Surgeon: Molli Posey, MD;  Location: [facility];  Service: Gynecology;  Laterality: N/A;  . LIPOMA EXCISION     back  . UPPER GI ENDOSCOPY  01/20/1989    There were no vitals filed for this visit.   Subjective Assessment - 10/10/19 1517    Subjective COVID 19 screening performed on patient upon arrival. Reports she has been walking around without a cane  but feels confident with walking without it around the house. Still practicing SLS but unable to hold for long periods of time.    Pertinent History MI, CABG, HTN, DM, OA, Osteoporosis    Limitations Lifting;Standing;Walking;House hold activities    How long can you stand comfortably? 2 minutes max and then she has to hold onto something    How long can you walk comfortably? Short distances only less than 3  minutes    Patient Stated Goals PT wants to be able to walk better and be more stable.    Currently in Pain? No/denies  [place] Adult PT Treatment/Exercise - 10/10/19 0001      Knee/Hip Exercises: Aerobic   Nustep L4 x 15 minutes      Knee/Hip Exercises: Machines for Strengthening   Cybex Knee Extension 10# x2 minutes    Cybex Knee Flexion 30# x2 minutes      Knee/Hip Exercises: Seated   Clamshell with TheraBand Red   x30   Sit to Sand 2 sets;10 reps;without UE support               Balance Exercises - 10/10/19 0001      Balance Exercises: Standing   Standing Eyes Opened Wide (BOA);Narrow base of support (BOS);Foam/compliant surface;Time   5x1 min   Standing Eyes Closed Narrow base of support (BOS);Solid surface;2 reps;30 secs   1x30" on foam   Tandem Stance Eyes open;2 reps;30 secs   bilaterally   SLS Eyes open;Intermittent upper extremity support;Time;Solid surface;2 reps   4x1 min  kickstand position; bilaterally   SLS Time      Step Ups Forward;Intermittent UE support;6 inch   step taps x20              PT Short Term Goals - 09/28/19 1404      PT SHORT TERM GOAL #1   Title PT LE strength to be incresed by 1/2 grade to allow pt to come sit to stand from a chair without using UE with ease.    Baseline 08/31/19 able to perfrom with no difficulty    Time 3    Period Weeks    Status Achieved      PT SHORT TERM GOAL #2   Title Pt to be able to single leg stance on both LE for 15 seconds to reduce risk of falling.    Baseline 3sec bil LE 09/28/19    Time 3    Period Weeks    Status On-going             PT Long Term Goals - 10/05/19 1208      PT LONG TERM GOAL #1   Title Pt LE strength to improve one grade to be able to rise off of her couch without difficulty.    Baseline -    Status On-going      PT LONG TERM GOAL #2   Title Pt to have improved her foto score by 10 pt to demonstrate improved functional ability    Baseline -    Status Achieved      PT LONG TERM GOAL #3   Title Pt TUG to be less than 10 seconds for pt to feel confident getting up and answering the door.    Baseline -    Period Weeks    Status On-going      PT LONG TERM GOAL #4   Title PT to be I in walking with  a cane for 500 ft to improve I for short shopping trips    Status On-going                 Plan - 10/10/19 1524    Clinical Impression Statement Patient responded well to therapy session but still requiring contact guard assist and intermittent UE support to maintain balance. Patient responded well to step taps to 6" step with hands in high guard. Patient feels more confident with balance exercises but still unable to SLS. Patient has improved with technique with sit to stand is able to complete without UE support.    Personal Factors  and Comorbidities Age;Comorbidity 2    Comorbidities CABG, HTN, DM, OA, osteoporosis    Examination-Activity Limitations  Bend;Carry;Dressing;Hygiene/Grooming;Lift;Locomotion Level;Stairs;Stand    Examination-Participation Restrictions Cleaning;Community Activity;Laundry;Shop;Meal Prep    Stability/Clinical Decision Making Stable/Uncomplicated    Clinical Decision Making Low    Rehab Potential Good    PT Frequency 2x / week    PT Duration 6 weeks    PT Treatment/Interventions Therapeutic activities;Therapeutic exercise;Balance training;Neuromuscular re-education;Patient/family education;Manual techniques    PT Next Visit Plan Continue functional strengthening and balance training and progress as tolerated    PT Home Exercise Plan LAQ,SLR< bridge, Hip abduction    Consulted and Agree with Plan of Care Patient           Patient will benefit from skilled therapeutic intervention in order to improve the following deficits and impairments:  Abnormal gait, Decreased activity tolerance, Decreased balance, Decreased strength, Difficulty walking  Visit Diagnosis: Other abnormalities of gait and mobility  Muscle weakness (generalized)     Problem List Patient Active Problem List   Diagnosis Date Noted  . Type 2 diabetes mellitus with hyperglycemia, without long-term current use of insulin ([place]) 01/27/2019  . Type 2 diabetes mellitus with stage 3b chronic kidney disease, without long-term current use of insulin ([place]) 01/27/2019  . Chronic renal insufficiency, stage 3 (moderate) 09/10/2017  . Pre-operative clearance 09/10/2017  . Chronic pain of both shoulders 04/27/2017  . Vitamin D deficiency 01/28/2016  . Non-insulin treated type 2 diabetes mellitus ([place]) 11/12/2015  . Statin intolerance 08/18/2013  . Neuromuscular disorder ([place])   . Hemochromatosis, hereditary ([place]) 11/24/2012  . S/P CABG x 2 08/09/2012  . PVC's (premature ventricular contractions) 08/09/2012  . Gout 06/20/2012  . Hypertension associated with diabetes ([place]) 06/20/2012  . HLD (hyperlipidemia) 06/20/2012  . Arthritis 06/20/2012     Gabriela Eves, PT, DPT 10/10/2019, 5:12 PM  [facility] [address] [place], [place], [address] Phone: [phone]   Fax:  [phone]  Name: RADONNA BRACHER MRN: [id number] Date of Birth: 1941-01-07

## 2019-10-12 ENCOUNTER — Ambulatory Visit: Payer: Medicare Other | Admitting: Physical Therapy

## 2019-10-12 ENCOUNTER — Encounter: Payer: Self-pay | Admitting: Physical Therapy

## 2019-10-12 ENCOUNTER — Other Ambulatory Visit: Payer: Self-pay

## 2019-10-12 DIAGNOSIS — M6281 Muscle weakness (generalized): Secondary | ICD-10-CM | POA: Diagnosis not present

## 2019-10-12 DIAGNOSIS — R2689 Other abnormalities of gait and mobility: Secondary | ICD-10-CM

## 2019-10-12 NOTE — Therapy (Signed)
[facility] [place], [place], [address] Phone: [phone]   Fax:  [phone]  Physical Therapy Treatment  Patient Details  Name: Carmen [name] MRN: [id number] Date of Birth: 1940/05/31 Referring Provider (PT): Melony Overly    Encounter Date: 10/12/2019   PT End of Session - 10/12/19 1320    Visit Number 15    Number of Visits 18    Date for PT Re-Evaluation 10/20/19    Authorization Type [employer]; Progress note every 10th visit; KX modifier at 15th visit    PT Start Time 1300    PT Stop Time 1344    PT Time Calculation (min) 44 min    Equipment Utilized During Treatment Other (comment)    Activity Tolerance Patient tolerated treatment well    Behavior During Therapy [facility] for tasks assessed/performed           Past Medical History:  Diagnosis Date  . Arthritis   . Asymptomatic PVCs   . Bilateral shoulder pain   . CAD (coronary artery disease)   . Cancer ([place]) 11/2015   melanomax4  right upper arm  . Chronic renal insufficiency, stage 3 (moderate)   . Diabetes mellitus without complication ([place])   . Diverticulosis   . Endometrial polyp   . External hemorrhoids   . Gout   . Hemochromatosis, hereditary ([place]) 11/24/2012  . History of anemia   . History of duodenal ulcer 1990  . Hyperlipidemia   . Hypertension   . Internal hemorrhoids   . Jaundice    age 79 or 89  . Myocardial infarction ([place]) 2007  . Neuromuscular disorder ([place])    peripheral neuropathy  . PMB (postmenopausal bleeding)   . PONV (postoperative nausea and vomiting)   . Prolapse of female pelvic organs    uses pessary  . Seizures ([place])    had one at Dr. Antonieta Pert office after getting blood drawn  . Tick bite 08/12/2017   had 3 bites  . Vitamin D deficiency     Past Surgical History:  Procedure Laterality Date  . BREAST SURGERY     left breast lump--benign  . CARDIAC CATHETERIZATION  06/03/2005  . COLONOSCOPY   11/28/2001  . CORONARY ARTERY BYPASS GRAFT  2007   x2 Dr. Roxan Hockey, LIMA to LAD, SVG to PDA  . DILATATION & CURETTAGE/HYSTEROSCOPY WITH MYOSURE N/A 09/28/2017   Procedure: DILATATION & CURETTAGE/HYSTEROSCOPY WITH MYOSURE;  Surgeon: Molli Posey, MD;  Location: [facility];  Service: Gynecology;  Laterality: N/A;  . LIPOMA EXCISION     back  . UPPER GI ENDOSCOPY  01/20/1989    There were no vitals filed for this visit.   Subjective Assessment - 10/12/19 1319    Subjective COVID 19 screening performed on patient upon arrival. Patient reports feeling fine.    Pertinent History MI, CABG, HTN, DM, OA, Osteoporosis    Limitations Lifting;Standing;Walking;House hold activities    How long can you stand comfortably? 2 minutes max and then she has to hold onto something    How long can you walk comfortably? Short distances only less than 3  minutes    Patient Stated Goals PT wants to be able to walk better and be more stable.    Currently in Pain? No/denies              [facility] PT Assessment - 10/12/19 0001      Assessment   Medical Diagnosis abnormality of gait with decreased balance  Referring Provider (PT) Melony Overly     Onset Date/Surgical Date 12/22/18    Hand Dominance Right    Next MD Visit N/A    Prior Therapy none      Precautions   Precautions [facility] Adult PT Treatment/Exercise - 10/12/19 0001      Knee/Hip Exercises: Aerobic   Nustep L4 x 15 minutes      Knee/Hip Exercises: Machines for Strengthening   Cybex Knee Extension 10# 3x10    Cybex Knee Flexion 30# 3x10      Knee/Hip Exercises: Standing   Heel Raises Both;20 reps   toe raises on foam   Knee Flexion Strengthening;Both;3 sets;10 reps    Knee Flexion Limitations 2#    Hip Flexion Stengthening;Both;Knee bent;3 sets;10 reps    Hip Flexion Limitations 2#    Hip Abduction Stengthening;Both;Knee straight;Limitations;3 sets;10 reps     Abduction Limitations 2#    Rocker Board 3 minutes    SLS with toe touch down 2x1 min bilaterally    Other Standing Knee Exercises lateral stepping yellow theraband x2 minutes                    PT Short Term Goals - 09/28/19 1404      PT SHORT TERM GOAL #1   Title PT LE strength to be incresed by 1/2 grade to allow pt to come sit to stand from a chair without using UE with ease.    Baseline 08/31/19 able to perfrom with no difficulty    Time 3    Period Weeks    Status Achieved      PT SHORT TERM GOAL #2   Title Pt to be able to single leg stance on both LE for 15 seconds to reduce risk of falling.    Baseline 3sec bil LE 09/28/19    Time 3    Period Weeks    Status On-going             PT Long Term Goals - 10/05/19 1208      PT LONG TERM GOAL #1   Title Pt LE strength to improve one grade to be able to rise off of her couch without difficulty.    Baseline -    Status On-going      PT LONG TERM GOAL #2   Title Pt to have improved her foto score by 10 pt to demonstrate improved functional ability    Baseline -    Status Achieved      PT LONG TERM GOAL #3   Title Pt TUG to be less than 10 seconds for pt to feel confident getting up and answering the door.    Baseline -    Period Weeks    Status On-going      PT LONG TERM GOAL #4   Title PT to be I in walking with  a cane for 500 ft to improve I for short shopping trips    Status On-going                 Plan - 10/12/19 1625    Clinical Impression Statement Patient was able to tolerate treatment well with additional strengthening exercises. Patient able to perform standing TEs with good technique with minimal compensatory motions. Patient feels confident with TEs and HEP and will be ready for  DC in remaining visits.    Personal Factors and Comorbidities Age;Comorbidity 2    Comorbidities CABG, HTN, DM, OA, osteoporosis    Examination-Activity Limitations  Bend;Carry;Dressing;Hygiene/Grooming;Lift;Locomotion Level;Stairs;Stand    Examination-Participation Restrictions Cleaning;Community Activity;Laundry;Shop;Meal Prep    Stability/Clinical Decision Making Stable/Uncomplicated    Clinical Decision Making Low    Rehab Potential Good    PT Frequency 2x / week    PT Duration 6 weeks    PT Treatment/Interventions Therapeutic activities;Therapeutic exercise;Balance training;Neuromuscular re-education;Patient/family education;Manual techniques    PT Next Visit Plan Continue functional strengthening and balance training and progress as tolerated    PT Home Exercise Plan LAQ,SLR< bridge, Hip abduction    Consulted and Agree with Plan of Care Patient           Patient will benefit from skilled therapeutic intervention in order to improve the following deficits and impairments:  Abnormal gait, Decreased activity tolerance, Decreased balance, Decreased strength, Difficulty walking  Visit Diagnosis: Other abnormalities of gait and mobility  Muscle weakness (generalized)     Problem List Patient Active Problem List   Diagnosis Date Noted  . Type 2 diabetes mellitus with hyperglycemia, without long-term current use of insulin ([place]) 01/27/2019  . Type 2 diabetes mellitus with stage 3b chronic kidney disease, without long-term current use of insulin ([place]) 01/27/2019  . Chronic renal insufficiency, stage 3 (moderate) 09/10/2017  . Pre-operative clearance 09/10/2017  . Chronic pain of both shoulders 04/27/2017  . Vitamin D deficiency 01/28/2016  . Non-insulin treated type 2 diabetes mellitus ([place]) 11/12/2015  . Statin intolerance 08/18/2013  . Neuromuscular disorder ([place])   . Hemochromatosis, hereditary ([place]) 11/24/2012  . S/P CABG x 2 08/09/2012  . PVC's (premature ventricular contractions) 08/09/2012  . Gout 06/20/2012  . Hypertension associated with diabetes ([place]) 06/20/2012  . HLD (hyperlipidemia) 06/20/2012  . Arthritis 06/20/2012     Gabriela Eves, PT, DPT 10/12/2019, 4:41 PM  [facility] [address] [place], [place], [address] Phone: [phone]   Fax:  [phone]  Name: AZELIA REIGER MRN: [id number] Date of Birth: 1940-11-24

## 2019-10-17 ENCOUNTER — Encounter: Payer: Self-pay | Admitting: Physical Therapy

## 2019-10-17 ENCOUNTER — Ambulatory Visit: Payer: Medicare Other | Admitting: Physical Therapy

## 2019-10-17 ENCOUNTER — Other Ambulatory Visit: Payer: Self-pay

## 2019-10-17 DIAGNOSIS — R2689 Other abnormalities of gait and mobility: Secondary | ICD-10-CM

## 2019-10-17 DIAGNOSIS — M6281 Muscle weakness (generalized): Secondary | ICD-10-CM | POA: Diagnosis not present

## 2019-10-17 NOTE — Therapy (Signed)
[facility] [place], [place], [address] Phone: [phone]   Fax:  [phone]  Physical Therapy Treatment  Patient Details  Name: Carmen [name] MRN: [id number] Date of Birth: [birth year] Referring Provider (PT): Melony Overly    Encounter Date: 10/17/2019   PT End of Session - 10/17/19 1354    Visit Number 16    Number of Visits 18    Date for PT Re-Evaluation 10/20/19    Authorization Type [employer]; Progress note every 10th visit; KX modifier at 15th visit    PT Start Time 1345    PT Stop Time 1426    PT Time Calculation (min) 41 min    Equipment Utilized During Treatment Other (comment)    Activity Tolerance Patient tolerated treatment well           Past Medical History:  Diagnosis Date  . Arthritis   . Asymptomatic PVCs   . Bilateral shoulder pain   . CAD (coronary artery disease)   . Cancer ([place]) 11/2015   melanomax4  right upper arm  . Chronic renal insufficiency, stage 3 (moderate)   . Diabetes mellitus without complication ([place])   . Diverticulosis   . Endometrial polyp   . External hemorrhoids   . Gout   . Hemochromatosis, hereditary ([place]) 11/24/2012  . History of anemia   . History of duodenal ulcer 1990  . Hyperlipidemia   . Hypertension   . Internal hemorrhoids   . Jaundice    age 79 or 79  . Myocardial infarction ([place]) 2007  . Neuromuscular disorder ([place])    peripheral neuropathy  . PMB (postmenopausal bleeding)   . PONV (postoperative nausea and vomiting)   . Prolapse of female pelvic organs    uses pessary  . Seizures ([place])    had one at Dr. Antonieta Pert office after getting blood drawn  . Tick bite 08/12/2017   had 3 bites  . Vitamin D deficiency     Past Surgical History:  Procedure Laterality Date  . BREAST SURGERY     left breast lump--benign  . CARDIAC CATHETERIZATION  06/03/2005  . COLONOSCOPY  11/28/2001  . CORONARY ARTERY BYPASS GRAFT  2007   x2 Dr.  Roxan Hockey, [place] to LAD, SVG to PDA  . DILATATION & CURETTAGE/HYSTEROSCOPY WITH MYOSURE N/A 09/28/2017   Procedure: DILATATION & CURETTAGE/HYSTEROSCOPY WITH MYOSURE;  Surgeon: Molli Posey, MD;  Location: [facility];  Service: Gynecology;  Laterality: N/A;  . LIPOMA EXCISION     back  . UPPER GI ENDOSCOPY  01/20/1989    There were no vitals filed for this visit.   Subjective Assessment - 10/17/19 1351    Subjective COVID-19 screen performed on pt upon arrival. Pt reproting no sleeping well last night.    Pertinent History MI, CABG, HTN, DM, OA, Osteoporosis    Limitations Lifting;Standing;Walking;House hold activities    How long can you stand comfortably? 2 minutes max and then she has to hold onto something    How long can you walk comfortably? Short distances only less than 3  minutes    Patient Stated Goals PT wants to be able to walk better and be more stable.    Currently in Pain? No/denies              [facility] PT Assessment - 10/17/19 0001      Assessment   Medical Diagnosis abnormality of gait with decreased balance     Referring Provider (PT) Melony Overly  Onset Date/Surgical Date 12/22/18    Hand Dominance Right    Next MD Visit N/A    Prior Therapy none      Precautions   Precautions Fall      Transfers   Five time sit to stand comments  9.02 seconds                          [facility] Adult PT Treatment/Exercise - 10/17/19 0001      Knee/Hip Exercises: Aerobic   Nustep L4 x 10 minutes      Knee/Hip Exercises: Machines for Strengthening   Cybex Knee Extension 10# 30 reps    Cybex Knee Flexion 30# 30 reps, 40# x 20 reps      Knee/Hip Exercises: Standing   Heel Raises Both;20 reps   toe raises on foam   Knee Flexion Strengthening;Both;3 sets;15 reps    Hip Flexion Stengthening;Both;3 sets;15 reps;Knee bent    Hip Abduction Stengthening;Both;3 sets;15 reps;Knee straight;Limitations    Rocker Board 3 minutes    Other  Standing Knee Exercises tapping on balance pods with CGA                Balance Exercises - 10/17/19 0001      Balance Exercises: Standing   Standing Eyes Closed Narrow base of support (BOS);Solid surface;2 reps;30 secs   1x30" on foam   Tandem Stance Eyes open;2 reps;30 secs   bilaterally   SLS Eyes open;Intermittent upper extremity support;Time;Solid surface;2 reps   4x1 min kickstand position; bilaterally   Step Ups Forward;Intermittent UE support;6 inch   step taps x20              PT Short Term Goals - 09/28/19 1404      PT SHORT TERM GOAL #1   Title PT LE strength to be incresed by 1/2 grade to allow pt to come sit to stand from a chair without using UE with ease.    Baseline 08/31/19 able to perfrom with no difficulty    Time 3    Period Weeks    Status Achieved      PT SHORT TERM GOAL #2   Title Pt to be able to single leg stance on both LE for 15 seconds to reduce risk of falling.    Baseline 3sec bil LE 09/28/19    Time 3    Period Weeks    Status On-going             PT Long Term Goals - 10/17/19 1357      PT LONG TERM GOAL #1   Title Pt LE strength to improve one grade to be able to rise off of her couch without difficulty.    Time 6    Period Weeks    Status On-going      PT LONG TERM GOAL #2   Title Pt to have improved her foto score by 10 pt to demonstrate improved functional ability    Time 6    Period Weeks    Status Achieved      PT LONG TERM GOAL #3   Title Pt TUG to be less than 10 seconds for pt to feel confident getting up and answering the door.    Baseline TUG:9.02  seconds  (10/17/2019)    Time 6    Period Weeks    Status Achieved      PT LONG TERM GOAL #4   Title PT to be  I in walking with  a cane for 500 ft to improve I for short shopping trips    Time 6    Period Weeks    Status On-going                 Plan - 10/17/19 1354    Clinical Impression Statement Pt arriving today reporting no pain. Pt tolerating  strenghtening exericses well today. Still working toward improved core activation and stabiltiy. 2 more therapy visit scheduled.    Personal Factors and Comorbidities Age;Comorbidity 2    Comorbidities CABG, HTN, DM, OA, osteoporosis    Examination-Activity Limitations Bend;Carry;Dressing;Hygiene/Grooming;Lift;Locomotion Level;Stairs;Stand    Examination-Participation Restrictions Cleaning;Community Activity;Laundry;Shop;Meal Prep    Stability/Clinical Decision Making Stable/Uncomplicated    Rehab Potential Good    PT Frequency 2x / week    PT Duration 6 weeks    PT Treatment/Interventions Therapeutic activities;Therapeutic exercise;Balance training;Neuromuscular re-education;Patient/family education;Manual techniques    PT Next Visit Plan Continue functional strengthening and balance training and progress as tolerated    PT Home Exercise Plan LAQ,SLR< bridge, Hip abduction    Consulted and Agree with Plan of Care Patient           Patient will benefit from skilled therapeutic intervention in order to improve the following deficits and impairments:  Abnormal gait, Decreased activity tolerance, Decreased balance, Decreased strength, Difficulty walking  Visit Diagnosis: Other abnormalities of gait and mobility  Muscle weakness (generalized)     Problem List Patient Active Problem List   Diagnosis Date Noted  . Type 2 diabetes mellitus with hyperglycemia, without long-term current use of insulin ([place]) 01/27/2019  . Type 2 diabetes mellitus with stage 3b chronic kidney disease, without long-term current use of insulin ([place]) 01/27/2019  . Chronic renal insufficiency, stage 3 (moderate) 09/10/2017  . Pre-operative clearance 09/10/2017  . Chronic pain of both shoulders 04/27/2017  . Vitamin D deficiency 01/28/2016  . Non-insulin treated type 2 diabetes mellitus ([place]) 11/12/2015  . Statin intolerance 08/18/2013  . Neuromuscular disorder ([place])   . Hemochromatosis, hereditary ([place])  11/24/2012  . S/P CABG x 2 08/09/2012  . PVC's (premature ventricular contractions) 08/09/2012  . Gout 06/20/2012  . Hypertension associated with diabetes ([place]) 06/20/2012  . HLD (hyperlipidemia) 06/20/2012  . Arthritis 06/20/2012    Oretha Caprice, PT, MPT 10/17/2019, 2:27 PM  [facility] [place], [place], [address] Phone: [phone]   Fax:  [phone]  Name: Carmen [name] MRN: [id number] Date of Birth: [birth year]

## 2019-10-19 ENCOUNTER — Other Ambulatory Visit: Payer: Self-pay

## 2019-10-19 ENCOUNTER — Ambulatory Visit: Payer: Medicare Other | Admitting: Physical Therapy

## 2019-10-19 ENCOUNTER — Encounter: Payer: Self-pay | Admitting: Physical Therapy

## 2019-10-19 DIAGNOSIS — R2689 Other abnormalities of gait and mobility: Secondary | ICD-10-CM | POA: Diagnosis not present

## 2019-10-19 DIAGNOSIS — M6281 Muscle weakness (generalized): Secondary | ICD-10-CM | POA: Diagnosis not present

## 2019-10-19 NOTE — Therapy (Signed)
[facility] [place], [place], [address] Phone: [phone]   Fax:  [phone]  Physical Therapy Treatment  Patient Details  Name: Carmen [name] MRN: [id number] Date of Birth: Nov 06, 1940 Referring Provider (PT): Melony Overly    Encounter Date: 10/19/2019   PT End of Session - 10/19/19 1353    Visit Number 17    Number of Visits 18    Date for PT Re-Evaluation 11/03/19    Authorization Type [employer] MEDI[employer]; Progress note every 10th visit; KX modifier at 15th visit    PT Start Time 1350    PT Stop Time 1430    PT Time Calculation (min) 40 min    Equipment Utilized During Treatment Other (comment)   SPC   Activity Tolerance Patient tolerated treatment well    Behavior During Therapy [facility] for tasks assessed/performed           Past Medical History:  Diagnosis Date  . Arthritis   . Asymptomatic PVCs   . Bilateral shoulder pain   . CAD (coronary artery disease)   . Cancer ([place]) 11/2015   melanomax4  right upper arm  . Chronic renal insufficiency, stage 3 (moderate)   . Diabetes mellitus without complication ([place])   . Diverticulosis   . Endometrial polyp   . External hemorrhoids   . Gout   . Hemochromatosis, hereditary ([place]) 11/24/2012  . History of anemia   . History of duodenal ulcer 1990  . Hyperlipidemia   . Hypertension   . Internal hemorrhoids   . Jaundice    age 49 or 48  . Myocardial infarction ([place]) 2007  . Neuromuscular disorder ([place])    peripheral neuropathy  . PMB (postmenopausal bleeding)   . PONV (postoperative nausea and vomiting)   . Prolapse of female pelvic organs    uses pessary  . Seizures ([place])    had one at Dr. Antonieta Pert office after getting blood drawn  . Tick bite 08/12/2017   had 3 bites  . Vitamin D deficiency     Past Surgical History:  Procedure Laterality Date  . BREAST SURGERY     left breast lump--benign  . CARDIAC CATHETERIZATION  06/03/2005  . COLONOSCOPY   11/28/2001  . CORONARY ARTERY BYPASS GRAFT  2007   x2 Dr. Roxan Hockey, LIMA to LAD, SVG to PDA  . DILATATION & CURETTAGE/HYSTEROSCOPY WITH MYOSURE N/A 09/28/2017   Procedure: DILATATION & CURETTAGE/HYSTEROSCOPY WITH MYOSURE;  Surgeon: Molli Posey, MD;  Location: [facility];  Service: Gynecology;  Laterality: N/A;  . LIPOMA EXCISION     back  . UPPER GI ENDOSCOPY  01/20/1989    There were no vitals filed for this visit.   Subjective Assessment - 10/19/19 1353    Subjective COVID-19 screen performed on pt upon arrival.    Pertinent History MI, CABG, HTN, DM, OA, Osteoporosis    Limitations Lifting;Standing;Walking;House hold activities    How long can you stand comfortably? 2 minutes max and then she has to hold onto something    How long can you walk comfortably? Short distances only less than 3  minutes    Patient Stated Goals PT wants to be able to walk better and be more stable.    Currently in Pain? No/denies              [facility] PT Assessment - 10/19/19 0001      Assessment   Medical Diagnosis abnormality of gait with decreased balance  Referring Provider (PT) Melony Overly     Onset Date/Surgical Date 12/22/18    Hand Dominance Right    Next MD Visit N/A    Prior Therapy none      Precautions   Precautions [facility] Adult PT Treatment/Exercise - 10/19/19 0001      Ambulation/Gait   Ambulation/Gait Yes    Ambulation/Gait Assistance 6: Modified independent (Device/Increase time)    Ambulation Distance (Feet) 785 Feet    Assistive device Straight cane    Gait Pattern Within Functional Limits    Ambulation Surface Level;Indoor      Knee/Hip Exercises: Aerobic   Nustep L5 x15 min      Knee/Hip Exercises: Machines for Strengthening   Cybex Knee Extension 10# 3x10 reps    Cybex Knee Flexion 30# 3x10 reps      Knee/Hip Exercises: Standing   Heel Raises Both;20 reps    Heel Raises Limitations B toe  raise x20 reps    SLS Attempts with BLE SLS for goal assessment x multiple reps each for different range of time    Other Standing Knee Exercises Sidestepping // bars x4 RT    Other Standing Knee Exercises B trunk rotation with ball from high <> low x15 reps each                    PT Short Term Goals - 10/19/19 1414      PT SHORT TERM GOAL #1   Title PT LE strength to be incresed by 1/2 grade to allow pt to come sit to stand from a chair without using UE with ease.    Baseline 08/31/19 able to perfrom with no difficulty    Time 3    Period Weeks    Status Achieved      PT SHORT TERM GOAL #2   Title Pt to be able to single leg stance on both LE for 15 seconds to reduce risk of falling.    Baseline 3sec bil LE 09/28/19    Time 3    Period Weeks    Status On-going      PT SHORT TERM GOAL #3   Title PT to be able to walk 300 ft in a 2 minute period with the use of her cane to improve functional mobility    Time 2    Period Weeks    Status Achieved             PT Long Term Goals - 10/19/19 1413      PT LONG TERM GOAL #1   Title Pt LE strength to improve one grade to be able to rise off of her couch without difficulty.    Time 6    Period Weeks    Status On-going      PT LONG TERM GOAL #2   Title Pt to have improved her foto score by 10 pt to demonstrate improved functional ability    Time 6    Period Weeks    Status Achieved      PT LONG TERM GOAL #3   Title Pt TUG to be less than 10 seconds for pt to feel confident getting up and answering the door.    Baseline TUG:9.02  seconds  (10/17/2019)    Time 6    Period Weeks    Status Achieved  PT LONG TERM GOAL #4   Title PT to be I in walking with  a cane for 500 ft to improve I for short shopping trips    Time 6    Period Weeks    Status Achieved                 Plan - 10/19/19 1436    Clinical Impression Statement Patient presented in clinic with reports of overall improvement with ADLs at  home and confidence with doing ADLs and being independent at home. Patient able to achieve LTG for gait today in clinic with no rest breaks. Patient observed ambulating WNL with no substantial deviations with SPC. Patient limited with SLS with BLE without a varying degree of UE support.    Personal Factors and Comorbidities Age;Comorbidity 2    Comorbidities CABG, HTN, DM, OA, osteoporosis    Examination-Activity Limitations Bend;Carry;Dressing;Hygiene/Grooming;Lift;Locomotion Level;Stairs;Stand    Examination-Participation Restrictions Cleaning;Community Activity;Laundry;Shop;Meal Prep    Stability/Clinical Decision Making Stable/Uncomplicated    Rehab Potential Good    PT Frequency 2x / week    PT Duration 6 weeks    PT Treatment/Interventions Therapeutic activities;Therapeutic exercise;Balance training;Neuromuscular re-education;Patient/family education;Manual techniques    PT Next Visit Plan D/C summary required next visit.    PT Home Exercise Plan LAQ,SLR< bridge, Hip abduction    Consulted and Agree with Plan of Care Patient           Patient will benefit from skilled therapeutic intervention in order to improve the following deficits and impairments:  Abnormal gait, Decreased activity tolerance, Decreased balance, Decreased strength, Difficulty walking  Visit Diagnosis: Other abnormalities of gait and mobility  Muscle weakness (generalized)     Problem List Patient Active Problem List   Diagnosis Date Noted  . Type 2 diabetes mellitus with hyperglycemia, without long-term current use of insulin ([place]) 01/27/2019  . Type 2 diabetes mellitus with stage 3b chronic kidney disease, without long-term current use of insulin ([place]) 01/27/2019  . Chronic renal insufficiency, stage 3 (moderate) 09/10/2017  . Pre-operative clearance 09/10/2017  . Chronic pain of both shoulders 04/27/2017  . Vitamin D deficiency 01/28/2016  . Non-insulin treated type 2 diabetes mellitus ([place]) 11/12/2015   . Statin intolerance 08/18/2013  . Neuromuscular disorder ([place])   . Hemochromatosis, hereditary ([place]) 11/24/2012  . S/P CABG x 2 08/09/2012  . PVC's (premature ventricular contractions) 08/09/2012  . Gout 06/20/2012  . Hypertension associated with diabetes ([place]) 06/20/2012  . HLD (hyperlipidemia) 06/20/2012  . Arthritis 06/20/2012    Standley Brooking, PTA 10/19/2019, 5:37 PM  [facility] [address] [place], [place], [address] Phone: [phone]   Fax:  [phone]  Name: Carmen [name] MRN: [id number] Date of Birth: [birth year]

## 2019-10-25 ENCOUNTER — Ambulatory Visit: Payer: Medicare Other | Attending: Otolaryngology | Admitting: Physical Therapy

## 2019-10-25 ENCOUNTER — Encounter: Payer: Self-pay | Admitting: Physical Therapy

## 2019-10-25 ENCOUNTER — Other Ambulatory Visit: Payer: Self-pay

## 2019-10-25 DIAGNOSIS — R2689 Other abnormalities of gait and mobility: Secondary | ICD-10-CM | POA: Diagnosis not present

## 2019-10-25 DIAGNOSIS — M6281 Muscle weakness (generalized): Secondary | ICD-10-CM | POA: Insufficient documentation

## 2019-10-25 NOTE — Therapy (Signed)
[facility] [place], [place], [address] Phone: [phone]   Fax:  [phone]  Physical Therapy Treatment  PHYSICAL THERAPY DISCHARGE SUMMARY  Visits from Start of Care: 18  Current functional level related to goals / functional outcomes: See below   Remaining deficits: See goals   Education / Equipment: HEP Plan: Patient agrees to discharge.  Patient goals were partially met. Patient is being discharged due to meeting the stated rehab goals.  ?????   Gabriela Eves, PT, DPT  Patient Details  Name: Carmen [name] MRN: [id number] Date of Birth: [birth year] Referring Provider (PT): Melony Overly    Encounter Date: 10/25/2019   PT End of Session - 10/25/19 1205    Visit Number 18    Number of Visits 18    Date for PT Re-Evaluation 11/03/19    Authorization Type [employer] MEDIcare; Progress note every 10th visit; KX modifier at 15th visit    PT Start Time 1117    PT Stop Time 1157    PT Time Calculation (min) 40 min    Activity Tolerance Patient tolerated treatment well    Behavior During Therapy [facility] for tasks assessed/performed           Past Medical History:  Diagnosis Date  . Arthritis   . Asymptomatic PVCs   . Bilateral shoulder pain   . CAD (coronary artery disease)   . Cancer ([place]) 11/2015   melanomax4  right upper arm  . Chronic renal insufficiency, stage 3 (moderate)   . Diabetes mellitus without complication ([place])   . Diverticulosis   . Endometrial polyp   . External hemorrhoids   . Gout   . Hemochromatosis, hereditary ([place]) 11/24/2012  . History of anemia   . History of duodenal ulcer 1990  . Hyperlipidemia   . Hypertension   . Internal hemorrhoids   . Jaundice    age 73 or 75  . Myocardial infarction ([place]) 2007  . Neuromuscular disorder ([place])    peripheral neuropathy  . PMB (postmenopausal bleeding)   . PONV (postoperative nausea and vomiting)   . Prolapse of female pelvic  organs    uses pessary  . Seizures ([place])    had one at Dr. Antonieta Pert office after getting blood drawn  . Tick bite 08/12/2017   had 3 bites  . Vitamin D deficiency     Past Surgical History:  Procedure Laterality Date  . BREAST SURGERY     left breast lump--benign  . CARDIAC CATHETERIZATION  06/03/2005  . COLONOSCOPY  11/28/2001  . CORONARY ARTERY BYPASS GRAFT  2007   x2 Dr. Roxan Hockey, LIMA to LAD, SVG to PDA  . DILATATION & CURETTAGE/HYSTEROSCOPY WITH MYOSURE N/A 09/28/2017   Procedure: DILATATION & CURETTAGE/HYSTEROSCOPY WITH MYOSURE;  Surgeon: Molli Posey, MD;  Location: [facility];  Service: Gynecology;  Laterality: N/A;  . LIPOMA EXCISION     back  . UPPER GI ENDOSCOPY  01/20/1989    There were no vitals filed for this visit.   Subjective Assessment - 10/25/19 1125    Subjective COVID-19 screen performed on pt upon arrival.    Pertinent History MI, CABG, HTN, DM, OA, Osteoporosis    Limitations Lifting;Standing;Walking;House hold activities    How long can you stand comfortably? 2 minutes max and then she has to hold onto something    How long can you walk comfortably? Short distances only less than 3  minutes    Patient Stated Goals PT wants to  be able to walk better and be more stable.    Currently in Pain? No/denies              [facility] PT Assessment - 10/25/19 0001      Assessment   Medical Diagnosis abnormality of gait with decreased balance     Referring Provider (PT) Melony Overly     Onset Date/Surgical Date 12/22/18    Hand Dominance Right    Next MD Visit N/A    Prior Therapy none      Precautions   Precautions Fall      ROM / Strength   AROM / PROM / Strength Strength      Strength   Overall Strength Within functional limits for tasks performed    Strength Assessment Site Hip;Knee    Right/Left Hip Right;Left    Right Hip Flexion 4+/5    Left Hip Flexion 4+/5    Right/Left Knee Right;Left    Right Knee Flexion 4/5      Right Knee Extension 4+/5    Left Knee Flexion 4/5    Left Knee Extension 4+/5    Right/Left Ankle Right;Left    Right Ankle Dorsiflexion 4+/5    Left Ankle Dorsiflexion 4+/5                         [facility] Adult PT Treatment/Exercise - 10/25/19 0001      Knee/Hip Exercises: Aerobic   Nustep L5 x15 min      Knee/Hip Exercises: Machines for Strengthening   Cybex Knee Extension 10# 3x10 reps    Cybex Knee Flexion 40# 3x10 reps      Knee/Hip Exercises: Standing   Forward Step Up Both;2 sets;10 reps;Hand Hold: 2;Step Height: 6"   limited UE support   SLS 1 rep attempt for BLE; RLE limited to 5 sec hold, LLE 10 sec hold    Walking with Sports Cord Green XTS backward/sideways x10 reps      Knee/Hip Exercises: Seated   Sit to Sand 20 reps;without UE support                    PT Short Term Goals - 10/25/19 1202      PT SHORT TERM GOAL #1   Title PT LE strength to be incresed by 1/2 grade to allow pt to come sit to stand from a chair without using UE with ease.    Baseline 08/31/19 able to perfrom with no difficulty    Time 3    Period Weeks    Status Achieved      PT SHORT TERM GOAL #2   Title Pt to be able to single leg stance on both LE for 15 seconds to reduce risk of falling.    Baseline 3sec bil LE 09/28/19    Time 3    Period Weeks    Status Partially Met      PT SHORT TERM GOAL #3   Title PT to be able to walk 300 ft in a 2 minute period with the use of her cane to improve functional mobility    Time 2    Period Weeks    Status Achieved             PT Long Term Goals - 10/25/19 1202      PT LONG TERM GOAL #1   Title Pt LE strength to improve one grade to be able to rise off of her  couch without difficulty.    Time 6    Period Weeks    Status Achieved      PT LONG TERM GOAL #2   Title Pt to have improved her foto score by 10 pt to demonstrate improved functional ability    Time 6    Period Weeks    Status Achieved      PT LONG  TERM GOAL #3   Title Pt TUG to be less than 10 seconds for pt to feel confident getting up and answering the door.    Baseline TUG:9.02  seconds  (10/17/2019)    Time 6    Period Weeks    Status Achieved      PT LONG TERM GOAL #4   Title PT to be I in walking with  a cane for 500 ft to improve I for short shopping trips    Time 6    Period Weeks    Status Achieved                 Plan - 10/25/19 1207    Clinical Impression Statement Patient presented in clinic with reports of overall improvement since beginning PT. All goals were met except for SLS goal. Patient more confident within her home enviroment but has not been out into the community due to [place] pandemic. Patient requires very intermittant UE support for step ups.    Personal Factors and Comorbidities Age;Comorbidity 2    Comorbidities CABG, HTN, DM, OA, osteoporosis    Examination-Activity Limitations Bend;Carry;Dressing;Hygiene/Grooming;Lift;Locomotion Level;Stairs;Stand    Examination-Participation Restrictions Cleaning;Community Activity;Laundry;Shop;Meal Prep    Stability/Clinical Decision Making Stable/Uncomplicated    Rehab Potential Good    PT Frequency 2x / week    PT Duration 6 weeks    PT Treatment/Interventions Therapeutic activities;Therapeutic exercise;Balance training;Neuromuscular re-education;Patient/family education;Manual techniques    PT Next Visit Plan D/C summary required.    PT Home Exercise Plan LAQ,SLR< bridge, Hip abduction    Consulted and Agree with Plan of Care Patient           Patient will benefit from skilled therapeutic intervention in order to improve the following deficits and impairments:  Abnormal gait, Decreased activity tolerance, Decreased balance, Decreased strength, Difficulty walking  Visit Diagnosis: Other abnormalities of gait and mobility  Muscle weakness (generalized)     Problem List Patient Active Problem List   Diagnosis Date Noted  . Type 2 diabetes  mellitus with hyperglycemia, without long-term current use of insulin ([place]) 01/27/2019  . Type 2 diabetes mellitus with stage 3b chronic kidney disease, without long-term current use of insulin ([place]) 01/27/2019  . Chronic renal insufficiency, stage 3 (moderate) 09/10/2017  . Pre-operative clearance 09/10/2017  . Chronic pain of both shoulders 04/27/2017  . Vitamin D deficiency 01/28/2016  . Non-insulin treated type 2 diabetes mellitus ([place]) 11/12/2015  . Statin intolerance 08/18/2013  . Neuromuscular disorder ([place])   . Hemochromatosis, hereditary ([place]) 11/24/2012  . S/P CABG x 2 08/09/2012  . PVC's (premature ventricular contractions) 08/09/2012  . Gout 06/20/2012  . Hypertension associated with diabetes ([place]) 06/20/2012  . HLD (hyperlipidemia) 06/20/2012  . Arthritis 06/20/2012    Standley Brooking, PTA 10/25/19 12:25 PM   [facility] [address] [place], [place], [address] Phone: [phone]   Fax:  [phone]  Name: Carmen [name] MRN: [id number] Date of Birth: [birth year]

## 2019-10-26 ENCOUNTER — Telehealth: Payer: Self-pay | Admitting: Family Medicine

## 2019-10-26 DIAGNOSIS — E1159 Type 2 diabetes mellitus with other circulatory complications: Secondary | ICD-10-CM

## 2019-10-26 DIAGNOSIS — E1165 Type 2 diabetes mellitus with hyperglycemia: Secondary | ICD-10-CM

## 2019-10-26 DIAGNOSIS — E782 Mixed hyperlipidemia: Secondary | ICD-10-CM

## 2019-10-26 NOTE — Telephone Encounter (Signed)
LABS ORDERED.

## 2019-11-03 ENCOUNTER — Ambulatory Visit: Payer: Medicare Other | Admitting: Licensed Clinical Social Worker

## 2019-11-03 DIAGNOSIS — Z951 Presence of aortocoronary bypass graft: Secondary | ICD-10-CM

## 2019-11-03 DIAGNOSIS — M1990 Unspecified osteoarthritis, unspecified site: Secondary | ICD-10-CM

## 2019-11-03 DIAGNOSIS — N183 Chronic kidney disease, stage 3 unspecified: Secondary | ICD-10-CM

## 2019-11-03 DIAGNOSIS — E559 Vitamin D deficiency, unspecified: Secondary | ICD-10-CM

## 2019-11-03 DIAGNOSIS — E1159 Type 2 diabetes mellitus with other circulatory complications: Secondary | ICD-10-CM

## 2019-11-03 DIAGNOSIS — E782 Mixed hyperlipidemia: Secondary | ICD-10-CM

## 2019-11-03 NOTE — Chronic Care Management (AMB) (Signed)
Chronic Care Management    Clinical Social Work Follow Up Note  11/03/2019 Name: Carmen [name] MRN: [id number] DOB: Jul 13, 1940  Carmen [name] is a 79 y.o. year old female who is a primary care patient of Stacks, Cletus Gash, MD. The CCM team was consulted for assistance with [employer] .   Review of patient status, including review of consultants reports, other relevant assessments, and collaboration with appropriate care team members and the patient's provider was performed as part of comprehensive patient evaluation and provision of chronic care management services.    SDOH (Social Determinants of Health) assessments performed: No;risk for social isolation; risk for tobacco use; risk for depression; risk for stress; risk for physical inactivity    Chronic Care Management from 08/29/2019 in [facility]  PHQ-9 Total Score 4      GAD 7 : Generalized Anxiety Score 08/29/2019  Nervous, Anxious, on Edge 0  Control/stop worrying 1  Worry too much - different things 0  Trouble relaxing 1  Restless 0  Easily annoyed or irritable 0  Afraid - awful might happen 0  Total GAD 7 Score 2  Anxiety Difficulty Somewhat difficult    Outpatient Encounter Medications as of 11/03/2019  Medication Sig  . Alirocumab (PRALUENT) 150 MG/ML SOAJ Inject 1 Dose into the skin every 14 (fourteen) days.  Marland Kitchen allopurinol (ZYLOPRIM) 100 MG tablet TAKE  (1)  TABLET TWICE A DAY.  Marland Kitchen aspirin 81 MG tablet Take 81 mg by mouth daily.  . colchicine 0.6 MG tablet TAKE (1) TABLET DAILY AS NEEDED.  Marland Kitchen diclofenac Sodium (VOLTAREN) 1 % GEL APPLY 4 GRAMS TO AFFECTED AREA(S) 4 TIMES A DAY AS NEEDED  . fluticasone (FLONASE) 50 MCG/ACT nasal spray SPRAY 1 SPRAY IN EACH NOSTRIL ONCE DAILY.  . furosemide (LASIX) 20 MG tablet TAKE 1 TABLET DAILY  . glipiZIDE (GLUCOTROL) 10 MG tablet TAKE 2 TABLETS BEFORE BREAKFAST AND 1 TABLET DAILY BEFORE SUPPER  . glucose blood (ONE TOUCH ULTRA TEST) test strip  CHECK BLOOD SUGAR 2 TIMES A DAY  . icosapent Ethyl (VASCEPA) 1 g capsule Take 2 capsules (2 g total) by mouth 2 (two) times daily.  . Lancets Misc. (ACCU-CHEK FASTCLIX LANCET) KIT Use to check Blood Sugars  . meclizine (ANTIVERT) 12.5 MG tablet Take 1 tablet (12.5 mg total) by mouth 3 (three) times daily as needed for dizziness.  . metoprolol tartrate (LOPRESSOR) 25 MG tablet TAKE  (1)  TABLET TWICE A DAY.  Marland Kitchen olmesartan (BENICAR) 20 MG tablet TAKE 1 TABLET DAILY  . pregabalin (LYRICA) 50 MG capsule TAKE  (1)  CAPSULE  TWICE DAILY.  Marland Kitchen RESTASIS 0.05 % ophthalmic emulsion   . Semaglutide (RYBELSUS) 7 MG TABS Take 7 mg by mouth daily before breakfast.  . Vitamin D, Ergocalciferol, (DRISDOL) 1.25 MG (50000 UT) CAPS capsule Take 1 capsule (50,000 Units total) by mouth 2 (two) times a week.  . VOLTAREN 1 % GEL APPLY 4 GRAMS TO AFFECTED AREA(S) 4 TIMES A DAY AS NEEDED   No facility-administered encounter medications on file as of 11/03/2019.    Goals    .  Client will call LCSW in next 30 days to talk about her management of medical condtions and about her completion of ADLs (pt-stated)      CARE PLAN ENTRY   Current Barriers:  . Vertigo issues in client with chronic diagnoses of HTN, HLD, Arthritis, S/P CABG X 2, Viatmin D Deficiency, CKD . Mobility issues  Clinical Social Work Clinical  Goal(s):  Marland Kitchen LCSW to call client in next 30 days to talk with her about management of her medical condition and her completion of daily ADLs  Interventions:  Talked with client about RNCM support with CCM program  Talked with client about pain issues of client  Talked with client about social support network of client (has  support from daughter)  Talked with client about physical therapy sessions of client (client said she is now finished with physical therapy sessions)  Talked with client about ambulation needs of client (uses a cane to help her walk)  Talked with client previously about DME of client  (has a tub seat, hand held shower, grab bars, low step shower)   Patient Self Care Activities:  Does ADLs independently Attends scheduled medical appointments Drives to appointments and drives to complete errands  Patient Self Care Deficits:  . Balance issues . Ambulation issues . Vertigo issues  Initial goal documentation      Follow Up Plan: LCSW to call client in next 4 weeks to talk with client about her management of medical issues and about her completion of ADLs  Norva Riffle.Shankar Silber MSW, LCSW Licensed Clinical Social Worker [facility] Care Management [phone]

## 2019-11-03 NOTE — Patient Instructions (Addendum)
Licensed Clinical Education officer, museum Visit Information  Goals we discussed today:   Client will call LCSW in next 30 days to talk about her management of medical condtions and about her completion of ADLs (pt-stated)         CARE PLAN ENTRY   Current Barriers:   Vertigo issues in client with chronic diagnoses of HTN, HLD, Arthritis, S/P CABG X 2, Viatmin D Deficiency, CKD  Mobility issues  Clinical Social Work Clinical Goal(s):   LCSW to call client in next 30 days to talk with her about management of her medical condition and her completion of daily ADLs  Interventions:  Talked with client about RNCM support with CCM program  Talked with client about pain issues of client  Talked with client about social support network of client (has support from daughter)  Talked with client about physical therapy sessions of client (client said she is now finished with physical therapy sessions)  Talked with client about ambulation needs of client (uses a cane to help her walk)  Talked with client previously about DME of client (has a tub seat, hand held shower, grab bars, low step shower)   Patient Self Care Activities:  Does ADLs independently Attends scheduled medical appointments Drives to appointments and drives to complete errands  Patient Self Care Deficits:   Balance issues  Ambulation issues  Vertigo issues  Initial goal documentation      Follow Up Plan: LCSW to call client in next 4 weeks to talk with client about her management of medical issues and about her completion of ADLs  Materials Provided: No  The patient verbalized understanding of instructions provided today and declined a print copy of patient instruction materials.   Norva Riffle.Blakelyn Dinges MSW, LCSW Licensed Clinical Social Worker [facility] Care Management [phone]

## 2019-11-08 ENCOUNTER — Other Ambulatory Visit: Payer: Self-pay | Admitting: Family Medicine

## 2019-11-08 ENCOUNTER — Other Ambulatory Visit: Payer: Self-pay | Admitting: Internal Medicine

## 2019-11-08 DIAGNOSIS — E1159 Type 2 diabetes mellitus with other circulatory complications: Secondary | ICD-10-CM

## 2019-11-08 DIAGNOSIS — Z951 Presence of aortocoronary bypass graft: Secondary | ICD-10-CM

## 2019-11-08 DIAGNOSIS — I152 Hypertension secondary to endocrine disorders: Secondary | ICD-10-CM

## 2019-11-13 ENCOUNTER — Encounter: Payer: Self-pay | Admitting: Family Medicine

## 2019-11-13 ENCOUNTER — Ambulatory Visit (INDEPENDENT_AMBULATORY_CARE_PROVIDER_SITE_OTHER): Payer: Medicare Other | Admitting: Family Medicine

## 2019-11-13 ENCOUNTER — Other Ambulatory Visit: Payer: Self-pay

## 2019-11-13 VITALS — BP 147/74 | HR 76 | Temp 97.5°F | Resp 20 | Ht 64.0 in | Wt 190.4 lb

## 2019-11-13 DIAGNOSIS — N183 Chronic kidney disease, stage 3 unspecified: Secondary | ICD-10-CM

## 2019-11-13 DIAGNOSIS — Z789 Other specified health status: Secondary | ICD-10-CM

## 2019-11-13 DIAGNOSIS — I10 Essential (primary) hypertension: Secondary | ICD-10-CM | POA: Diagnosis not present

## 2019-11-13 DIAGNOSIS — E1165 Type 2 diabetes mellitus with hyperglycemia: Secondary | ICD-10-CM

## 2019-11-13 DIAGNOSIS — Z951 Presence of aortocoronary bypass graft: Secondary | ICD-10-CM

## 2019-11-13 DIAGNOSIS — E119 Type 2 diabetes mellitus without complications: Secondary | ICD-10-CM

## 2019-11-13 DIAGNOSIS — R42 Dizziness and giddiness: Secondary | ICD-10-CM

## 2019-11-13 DIAGNOSIS — E1159 Type 2 diabetes mellitus with other circulatory complications: Secondary | ICD-10-CM | POA: Diagnosis not present

## 2019-11-13 DIAGNOSIS — M1990 Unspecified osteoarthritis, unspecified site: Secondary | ICD-10-CM | POA: Diagnosis not present

## 2019-11-13 DIAGNOSIS — E782 Mixed hyperlipidemia: Secondary | ICD-10-CM

## 2019-11-13 DIAGNOSIS — E1122 Type 2 diabetes mellitus with diabetic chronic kidney disease: Secondary | ICD-10-CM

## 2019-11-13 DIAGNOSIS — N1832 Chronic kidney disease, stage 3b: Secondary | ICD-10-CM

## 2019-11-13 DIAGNOSIS — E1121 Type 2 diabetes mellitus with diabetic nephropathy: Secondary | ICD-10-CM

## 2019-11-13 LAB — BAYER DCA HB A1C WAIVED: HB A1C (BAYER DCA - WAIVED): 6.8 % (ref <7.0)

## 2019-11-13 MED ORDER — MECLIZINE HCL 12.5 MG PO TABS: 12.5000 mg | ORAL_TABLET | Freq: Three times a day (TID) | ORAL | 5 refills | Status: AC | PRN

## 2019-11-13 NOTE — Progress Notes (Signed)
Subjective:  Patient ID: Carmen [name], female    DOB: 1940-07-10  Age: 79 y.o. MRN: [id number]  CC: Medical Management of Chronic Issues   HPI Carmen [name] presents for  follow-up of hypertension. Patient has no history of headache chest pain or shortness of breath or recent cough. Patient also denies symptoms of TIA such as focal numbness or weakness. Patient denies side effects from medication. States taking it regularly.  Patient is followed in [place] for her diabetes.  Her endocrinologist recently started her on Rybelsus.  She is also using Praluent for her cholesterol.  She denies side effects.  She is in today for follow-up blood work and exam.   History [name] has a past medical history of Arthritis, Asymptomatic PVCs, Bilateral shoulder pain, CAD (coronary artery disease), Cancer ([place]) (11/2015), Chronic renal insufficiency, stage 3 (moderate), Diabetes mellitus without complication ([place]), Diverticulosis, Endometrial polyp, External hemorrhoids, Gout, Hemochromatosis, hereditary ([place]) (11/24/2012), History of anemia, History of duodenal ulcer (1990), Hyperlipidemia, Hypertension, Internal hemorrhoids, Jaundice, Myocardial infarction ([place]) (2007), Neuromuscular disorder ([place]), PMB (postmenopausal bleeding), PONV (postoperative nausea and vomiting), Prolapse of female pelvic organs, Seizures ([place]), Tick bite (08/12/2017), and Vitamin D deficiency.   She has a past surgical history that includes Lipoma excision; Breast surgery; Cardiac catheterization (06/03/2005); Colonoscopy (11/28/2001); Upper gi endoscopy (01/20/1989); Coronary artery bypass graft (2007); and Dilatation & curettage/hysteroscopy with myosure (N/A, 09/28/2017).   Her family history includes Arthritis in her sister, sister, and sister; Cancer (age of onset: 65) in her sister; Diabetes in her brother, brother, brother, and father; Diverticulitis in her sister; Hemachromatosis in her brother, sister, sister, and  sister; Hypertension in her father and mother; Lupus in her sister; Neuropathy in her mother; Stroke in her father and mother.She reports that she has never smoked. She has never used smokeless tobacco. She reports current alcohol use. She reports that she does not use drugs.  Current Outpatient Medications on File Prior to Visit  Medication Sig Dispense Refill  . Alirocumab (PRALUENT) 150 MG/ML SOAJ Inject 1 Dose into the skin every 14 (fourteen) days. 2 pen 11  . allopurinol (ZYLOPRIM) 100 MG tablet TAKE  (1)  TABLET TWICE A DAY. 180 tablet 1  . aspirin 81 MG tablet Take 81 mg by mouth daily.    . colchicine 0.6 MG tablet TAKE (1) TABLET DAILY AS NEEDED. 30 tablet 1  . diclofenac Sodium (VOLTAREN) 1 % GEL APPLY 4 GRAMS TO AFFECTED AREA(S) 4 TIMES A DAY AS NEEDED 100 g 0  . fluticasone (FLONASE) 50 MCG/ACT nasal spray SPRAY 1 SPRAY IN EACH NOSTRIL ONCE DAILY. 16 g 4  . furosemide (LASIX) 20 MG tablet TAKE 1 TABLET DAILY 90 tablet 0  . glipiZIDE (GLUCOTROL) 10 MG tablet TAKE 2 TABLETS BEFORE BREAKFAST AND 1 TABLET DAILY BEFORE SUPPER 90 tablet 0  . glucose blood (ONE TOUCH ULTRA TEST) test strip CHECK BLOOD SUGAR 2 TIMES A DAY 100 each prn  . icosapent Ethyl (VASCEPA) 1 g capsule Take 2 capsules (2 g total) by mouth 2 (two) times daily. 120 capsule 11  . Lancets Misc. (ACCU-CHEK FASTCLIX LANCET) KIT Use to check Blood Sugars 1 kit 0  . metoprolol tartrate (LOPRESSOR) 25 MG tablet TAKE  (1)  TABLET TWICE A DAY. 180 tablet 1  . olmesartan (BENICAR) 20 MG tablet TAKE 1 TABLET DAILY 90 tablet 1  . pregabalin (LYRICA) 50 MG capsule TAKE  (1)  CAPSULE  TWICE DAILY. 180 capsule 1  . RESTASIS 0.05 %  ophthalmic emulsion     . Semaglutide (RYBELSUS) 7 MG TABS Take 7 mg by mouth daily before breakfast. 30 tablet 6  . Vitamin D, Ergocalciferol, (DRISDOL) 1.25 MG (50000 UT) CAPS capsule Take 1 capsule (50,000 Units total) by mouth 2 (two) times a week. 26 capsule 3  . VOLTAREN 1 % GEL APPLY 4 GRAMS TO  AFFECTED AREA(S) 4 TIMES A DAY AS NEEDED 100 g 0   No current facility-administered medications on file prior to visit.    ROS Review of Systems  Constitutional: Negative.   HENT: Negative for congestion.   Eyes: Negative for visual disturbance.  Respiratory: Negative for shortness of breath.   Cardiovascular: Negative for chest pain.  Gastrointestinal: Negative for abdominal pain, constipation, diarrhea, nausea and vomiting.  Genitourinary: Negative for difficulty urinating.  Musculoskeletal: Negative for arthralgias and myalgias.  Neurological: Positive for dizziness (vertigo starting this morning). Negative for headaches.  Psychiatric/Behavioral: Negative for sleep disturbance.    Objective:  BP (!) 147/74   Pulse 76   Temp (!) 97.5 F (36.4 C) (Temporal)   Resp 20   Ht _0  (1.626 m)   Wt 190 lb 6 oz (86.4 kg)   SpO2 94%   BMI 32.68 kg/m   BP Readings from Last 3 Encounters:  11/13/19 (!) 147/74  08/23/19 128/68  07/24/19 (!) 153/54    Wt Readings from Last 3 Encounters:  11/13/19 190 lb 6 oz (86.4 kg)  08/23/19 193 lb 3.2 oz (87.6 kg)  06/27/19 188 lb (85.3 kg)     Physical Exam Constitutional:      General: She is not in acute distress.    Appearance: She is well-developed.  HENT:     Head: Normocephalic and atraumatic.  Eyes:     Conjunctiva/sclera: Conjunctivae normal.     Pupils: Pupils are equal, round, and reactive to light.  Neck:     Thyroid: No thyromegaly.  Cardiovascular:     Rate and Rhythm: Normal rate and regular rhythm.     Heart sounds: Normal heart sounds. No murmur heard.   Pulmonary:     Effort: Pulmonary effort is normal. No respiratory distress.     Breath sounds: Normal breath sounds. No wheezing or rales.  Abdominal:     General: Bowel sounds are normal. There is no distension.     Palpations: Abdomen is soft.     Tenderness: There is no abdominal tenderness.  Musculoskeletal:        General: Normal range of motion.      Cervical back: Normal range of motion and neck supple.  Lymphadenopathy:     Cervical: No cervical adenopathy.  Skin:    General: Skin is warm and dry.  Neurological:     Mental Status: She is alert and oriented to person, place, and time.  Psychiatric:        Behavior: Behavior normal.        Thought Content: Thought content normal.        Judgment: Judgment normal.       Assessment & Plan:   Kaleiah was seen today for medical management of chronic issues.  Diagnoses and all orders for this visit:  Mixed hyperlipidemia -     CBC with Differential/Platelet -     CMP14+EGFR -     Lipid panel  Hypertension associated with diabetes ([place]) -     CBC with Differential/Platelet -     CMP14+EGFR -     Lipid panel  Type  2 diabetes mellitus with hyperglycemia, without long-term current use of insulin (HCC) -     Bayer DCA Hb A1c Waived -     CBC with Differential/Platelet -     CMP14+EGFR -     Lipid panel  Arthritis  S/P CABG x 2  Non-insulin treated type 2 diabetes mellitus (HCC)  Chronic renal insufficiency, stage 3 (moderate)  Type 2 diabetes mellitus with stage 3b chronic kidney disease, without long-term current use of insulin (HCC)  Statin intolerance  Vertigo -     meclizine (ANTIVERT) 12.5 MG tablet; Take 1 tablet (12.5 mg total) by mouth 3 (three) times daily as needed for dizziness.   Allergies as of 11/13/2019      Reactions   Cymbalta [duloxetine Hcl] Other (See Comments)   Profuse sweating   Januvia [sitagliptin] Swelling   Meloxicam Nausea And Vomiting   Trulicity [dulaglutide] Nausea Only   Diclofenac Diarrhea   Farxiga [dapagliflozin] Other (See Comments)   Dizziness   Shellfish Allergy Other (See Comments)   Gout   Tape    Causes skin irritation   Crestor [rosuvastatin] Other (See Comments)   Muscle aches on 5 mg daily and 10 mg twice weekly   Lipitor [atorvastatin] Other (See Comments)   Muscle aches   Livalo [pitavastatin] Other (See  Comments)      Sulfa Antibiotics Rash   Zetia [ezetimibe] Other (See Comments)   Muscle aches   Zocor [simvastatin] Other (See Comments)   Muscle aches       Medication List       Accurate as of November 13, 2019  6:39 PM. If you have any questions, ask your nurse or doctor.        Accu-Chek [employer] Kit Use to check Blood Sugars   allopurinol 100 MG tablet Commonly known as: ZYLOPRIM TAKE  (1)  TABLET TWICE A DAY.   aspirin 81 MG tablet Take 81 mg by mouth daily.   colchicine 0.6 MG tablet TAKE (1) TABLET DAILY AS NEEDED.   fluticasone 50 MCG/ACT nasal spray Commonly known as: FLONASE SPRAY 1 SPRAY IN EACH NOSTRIL ONCE DAILY.   furosemide 20 MG tablet Commonly known as: LASIX TAKE 1 TABLET DAILY   glipiZIDE 10 MG tablet Commonly known as: GLUCOTROL TAKE 2 TABLETS BEFORE BREAKFAST AND 1 TABLET DAILY BEFORE SUPPER   glucose blood test strip Commonly known as: ONE TOUCH ULTRA TEST CHECK BLOOD SUGAR 2 TIMES A DAY   icosapent Ethyl 1 g capsule Commonly known as: Vascepa Take 2 capsules (2 g total) by mouth 2 (two) times daily.   meclizine 12.5 MG tablet Commonly known as: ANTIVERT Take 1 tablet (12.5 mg total) by mouth 3 (three) times daily as needed for dizziness.   metoprolol tartrate 25 MG tablet Commonly known as: LOPRESSOR TAKE  (1)  TABLET TWICE A DAY.   olmesartan 20 MG tablet Commonly known as: BENICAR TAKE 1 TABLET DAILY   Praluent 150 MG/ML Soaj Generic drug: Alirocumab Inject 1 Dose into the skin every 14 (fourteen) days.   pregabalin 50 MG capsule Commonly known as: LYRICA TAKE  (1)  CAPSULE  TWICE DAILY.   Restasis 0.05 % ophthalmic emulsion Generic drug: cycloSPORINE   Rybelsus 7 MG Tabs Generic drug: Semaglutide Take 7 mg by mouth daily before breakfast.   Vitamin D (Ergocalciferol) 1.25 MG (50000 UNIT) Caps capsule Commonly known as: DRISDOL Take 1 capsule (50,000 Units total) by mouth 2 (two) times a week.   Voltaren 1  %  Gel Generic drug: diclofenac Sodium APPLY 4 GRAMS TO AFFECTED AREA(S) 4 TIMES A DAY AS NEEDED   diclofenac Sodium 1 % Gel Commonly known as: VOLTAREN APPLY 4 GRAMS TO AFFECTED AREA(S) 4 TIMES A DAY AS NEEDED       Meds ordered this encounter  Medications  . meclizine (ANTIVERT) 12.5 MG tablet    Sig: Take 1 tablet (12.5 mg total) by mouth 3 (three) times daily as needed for dizziness.    Dispense:  30 tablet    Refill:  5    Pt. Said she will pick this up today. Please do not deliver.      Follow-up: Return in about 6 months (around 05/15/2020).  Claretta Fraise, M.D.

## 2019-11-14 LAB — CBC WITH DIFFERENTIAL/PLATELET
Basophils Absolute: 0.1 10*3/uL (ref 0.0–0.2)
Basos: 1 %
EOS (ABSOLUTE): 0.3 10*3/uL (ref 0.0–0.4)
Eos: 3 %
Hematocrit: 40.1 % (ref 34.0–46.6)
Hemoglobin: 15.1 g/dL (ref 11.1–15.9)
Immature Grans (Abs): 0 10*3/uL (ref 0.0–0.1)
Immature Granulocytes: 0 %
Lymphocytes Absolute: 4.4 10*3/uL — ABNORMAL HIGH (ref 0.7–3.1)
Lymphs: 42 %
MCH: 34.3 pg — ABNORMAL HIGH (ref 26.6–33.0)
MCHC: 37.7 g/dL — ABNORMAL HIGH (ref 31.5–35.7)
MCV: 91 fL (ref 79–97)
Monocytes Absolute: 0.6 10*3/uL (ref 0.1–0.9)
Monocytes: 5 %
Neutrophils Absolute: 5.3 10*3/uL (ref 1.4–7.0)
Neutrophils: 49 %
Platelets: 185 10*3/uL (ref 150–450)
RBC: 4.4 x10E6/uL (ref 3.77–5.28)
RDW: 13.9 % (ref 11.7–15.4)
WBC: 10.7 10*3/uL (ref 3.4–10.8)

## 2019-11-14 LAB — CMP14+EGFR
ALT: 22 IU/L (ref 0–32)
AST: 23 IU/L (ref 0–40)
Albumin/Globulin Ratio: 1.7 (ref 1.2–2.2)
Albumin: 4.5 g/dL (ref 3.7–4.7)
Alkaline Phosphatase: 95 IU/L (ref 48–121)
BUN/Creatinine Ratio: 13 (ref 12–28)
BUN: 17 mg/dL (ref 8–27)
Bilirubin Total: 0.3 mg/dL (ref 0.0–1.2)
CO2: 28 mmol/L (ref 20–29)
Calcium: 9.7 mg/dL (ref 8.7–10.3)
Chloride: 102 mmol/L (ref 96–106)
Creatinine, Ser: 1.35 mg/dL — ABNORMAL HIGH (ref 0.57–1.00)
GFR calc Af Amer: 43 mL/min/{1.73_m2} — ABNORMAL LOW (ref >59)
GFR calc non Af Amer: 37 mL/min/{1.73_m2} — ABNORMAL LOW (ref >59)
Globulin, Total: 2.6 g/dL (ref 1.5–4.5)
Glucose: 183 mg/dL — ABNORMAL HIGH (ref 65–99)
Potassium: 3.8 mmol/L (ref 3.5–5.2)
Sodium: 146 mmol/L — ABNORMAL HIGH (ref 134–144)
Total Protein: 7.1 g/dL (ref 6.0–8.5)

## 2019-11-14 LAB — LIPID PANEL
Chol/HDL Ratio: 3.8 ratio (ref 0.0–4.4)
Cholesterol, Total: 145 mg/dL (ref 100–199)
HDL: 38 mg/dL — ABNORMAL LOW (ref >39)
LDL Chol Calc (NIH): 56 mg/dL (ref 0–99)
Triglycerides: 333 mg/dL — ABNORMAL HIGH (ref 0–149)
VLDL Cholesterol Cal: 51 mg/dL — ABNORMAL HIGH (ref 5–40)

## 2019-11-14 NOTE — Progress Notes (Signed)
Hello Velicia,  Your lab result is normal and/or stable.Some minor variations that are not significant are commonly marked abnormal, but do not represent any medical problem for you.  Best regards, Claretta Fraise, M.D.

## 2019-11-21 ENCOUNTER — Other Ambulatory Visit: Payer: Self-pay | Admitting: Family Medicine

## 2019-12-08 ENCOUNTER — Other Ambulatory Visit: Payer: Self-pay | Admitting: Internal Medicine

## 2019-12-11 ENCOUNTER — Telehealth: Payer: Medicare Other

## 2019-12-21 ENCOUNTER — Other Ambulatory Visit: Payer: Self-pay | Admitting: Family Medicine

## 2019-12-21 DIAGNOSIS — E1140 Type 2 diabetes mellitus with diabetic neuropathy, unspecified: Secondary | ICD-10-CM

## 2019-12-27 ENCOUNTER — Other Ambulatory Visit: Payer: Self-pay

## 2019-12-27 ENCOUNTER — Inpatient Hospital Stay (HOSPITAL_BASED_OUTPATIENT_CLINIC_OR_DEPARTMENT_OTHER): Payer: Medicare Other | Admitting: Hematology & Oncology

## 2019-12-27 ENCOUNTER — Telehealth: Payer: Self-pay | Admitting: Hematology & Oncology

## 2019-12-27 ENCOUNTER — Encounter: Payer: Self-pay | Admitting: Hematology & Oncology

## 2019-12-27 ENCOUNTER — Inpatient Hospital Stay: Payer: Medicare Other | Attending: Hematology & Oncology

## 2019-12-27 DIAGNOSIS — Z79899 Other long term (current) drug therapy: Secondary | ICD-10-CM | POA: Diagnosis not present

## 2019-12-27 DIAGNOSIS — E119 Type 2 diabetes mellitus without complications: Secondary | ICD-10-CM | POA: Insufficient documentation

## 2019-12-27 DIAGNOSIS — Z7982 Long term (current) use of aspirin: Secondary | ICD-10-CM | POA: Diagnosis not present

## 2019-12-27 LAB — CBC WITH DIFFERENTIAL (CANCER CENTER ONLY)
Abs Immature Granulocytes: 0.04 10*3/uL (ref 0.00–0.07)
Basophils Absolute: 0.1 10*3/uL (ref 0.0–0.1)
Basophils Relative: 1 %
Eosinophils Absolute: 0.3 10*3/uL (ref 0.0–0.5)
Eosinophils Relative: 3 %
HCT: 41.7 % (ref 36.0–46.0)
Hemoglobin: 14 g/dL (ref 12.0–15.0)
Immature Granulocytes: 1 %
Lymphocytes Relative: 38 %
Lymphs Abs: 3 10*3/uL (ref 0.7–4.0)
MCH: 30.8 pg (ref 26.0–34.0)
MCHC: 33.6 g/dL (ref 30.0–36.0)
MCV: 91.9 fL (ref 80.0–100.0)
Monocytes Absolute: 0.5 10*3/uL (ref 0.1–1.0)
Monocytes Relative: 6 %
Neutro Abs: 4.1 10*3/uL (ref 1.7–7.7)
Neutrophils Relative %: 51 %
Platelet Count: 147 10*3/uL — ABNORMAL LOW (ref 150–400)
RBC: 4.54 MIL/uL (ref 3.87–5.11)
RDW: 14 % (ref 11.5–15.5)
WBC Count: 8 10*3/uL (ref 4.0–10.5)
nRBC: 0 % (ref 0.0–0.2)

## 2019-12-27 LAB — CMP (CANCER CENTER ONLY)
ALT: 18 U/L (ref 0–44)
AST: 21 U/L (ref 15–41)
Albumin: 4.4 g/dL (ref 3.5–5.0)
Alkaline Phosphatase: 76 U/L (ref 38–126)
Anion gap: 10 (ref 5–15)
BUN: 20 mg/dL (ref 8–23)
CO2: 26 mmol/L (ref 22–32)
Calcium: 10.1 mg/dL (ref 8.9–10.3)
Chloride: 103 mmol/L (ref 98–111)
Creatinine: 1.27 mg/dL — ABNORMAL HIGH (ref 0.44–1.00)
GFR, Estimated: 40 mL/min — ABNORMAL LOW (ref >60)
Glucose, Bld: 194 mg/dL — ABNORMAL HIGH (ref 70–99)
Potassium: 3.8 mmol/L (ref 3.5–5.1)
Sodium: 139 mmol/L (ref 135–145)
Total Bilirubin: 0.4 mg/dL (ref 0.3–1.2)
Total Protein: 7.1 g/dL (ref 6.5–8.1)

## 2019-12-27 NOTE — Telephone Encounter (Signed)
Appointments scheduled patient has My Chart Access per 10/6 los

## 2019-12-27 NOTE — Progress Notes (Signed)
Hematology and Oncology Follow Up Visit  Carmen [name] [id number] 11-Apr-1940 79 y.o. 12/27/2019   Principle Diagnosis:  Hemochromatosis (double heterozygote for C282Y and S65C mutations).  Current Therapy:    Phlebotomy to maintain ferritin less than 100     Interim History:  [name] is back for followup.  Unfortunately, some of her relatives are in the hospital with the coronavirus.  Part of her family got vaccinated.  Part of her family did not get vaccinated.  She is seems to be doing pretty well.  She has been quite busy.  She certainly enjoys doing things.  It is still tough on her given that her husband passed away a few years ago.  We last saw her back in April.  At that time, her ferritin was 104.  We did go ahead and do a phlebotomy on her.  She has had no problems with bleeding.  Is been no change in bowel or bladder habits.  She has had no problems with leg swelling.  Her biggest problem clearly is the diabetes.  She is try to manage this.  Currently, her performance status is ECOG 0.     Medications:  Current Outpatient Medications:  .  Alirocumab (PRALUENT) 150 MG/ML SOAJ, Inject 1 Dose into the skin every 14 (fourteen) days., Disp: 2 pen, Rfl: 11 .  allopurinol (ZYLOPRIM) 100 MG tablet, TAKE  (1)  TABLET TWICE A DAY., Disp: 180 tablet, Rfl: 0 .  aspirin 81 MG tablet, Take 81 mg by mouth daily., Disp: , Rfl:  .  colchicine 0.6 MG tablet, TAKE (1) TABLET DAILY AS NEEDED., Disp: 30 tablet, Rfl: 1 .  diclofenac Sodium (VOLTAREN) 1 % GEL, APPLY 4 GRAMS TO AFFECTED AREA(S) 4 TIMES A DAY AS NEEDED, Disp: 100 g, Rfl: 0 .  fluticasone (FLONASE) 50 MCG/ACT nasal spray, SPRAY 1 SPRAY IN EACH NOSTRIL ONCE DAILY., Disp: 16 g, Rfl: 4 .  furosemide (LASIX) 20 MG tablet, TAKE 1 TABLET DAILY, Disp: 90 tablet, Rfl: 0 .  glipiZIDE (GLUCOTROL) 10 MG tablet, TAKE 2 TABLETS BEFORE BREAKFAST AND 1 TABLET DAILY BEFORE SUPPER, Disp: 90 tablet, Rfl: 0 .  glucose blood (ONE TOUCH  ULTRA TEST) test strip, CHECK BLOOD SUGAR 2 TIMES A DAY, Disp: 100 each, Rfl: prn .  icosapent Ethyl (VASCEPA) 1 g capsule, Take 2 capsules (2 g total) by mouth 2 (two) times daily., Disp: 120 capsule, Rfl: 11 .  Lancets Misc. (ACCU-CHEK FASTCLIX LANCET) KIT, Use to check Blood Sugars, Disp: 1 kit, Rfl: 0 .  meclizine (ANTIVERT) 12.5 MG tablet, Take 1 tablet (12.5 mg total) by mouth 3 (three) times daily as needed for dizziness., Disp: 30 tablet, Rfl: 5 .  metoprolol tartrate (LOPRESSOR) 25 MG tablet, TAKE  (1)  TABLET TWICE A DAY., Disp: 180 tablet, Rfl: 1 .  olmesartan (BENICAR) 20 MG tablet, TAKE 1 TABLET DAILY, Disp: 90 tablet, Rfl: 1 .  pregabalin (LYRICA) 50 MG capsule, TAKE (1) CAPSULE TWICE DAILY., Disp: 180 capsule, Rfl: 0 .  RESTASIS 0.05 % ophthalmic emulsion, , Disp: , Rfl:  .  Semaglutide (RYBELSUS) 7 MG TABS, Take 7 mg by mouth daily before breakfast., Disp: 30 tablet, Rfl: 6 .  Vitamin D, Ergocalciferol, (DRISDOL) 1.25 MG (50000 UT) CAPS capsule, Take 1 capsule (50,000 Units total) by mouth 2 (two) times a week., Disp: 26 capsule, Rfl: 3 .  VOLTAREN 1 % GEL, APPLY 4 GRAMS TO AFFECTED AREA(S) 4 TIMES A DAY AS NEEDED, Disp: 100 g,  Rfl: 0  Allergies:  Allergies  Allergen Reactions  . Cymbalta [Duloxetine Hcl] Other (See Comments)    Profuse sweating  . Januvia [Sitagliptin] Swelling  . Meloxicam Nausea And Vomiting  . Trulicity [Dulaglutide] Nausea Only  . Diclofenac Diarrhea  . [place]ade [Dapagliflozin] Other (See Comments)    Dizziness  . Shellfish Allergy Other (See Comments)    Gout  . Tape     Causes skin irritation  . Crestor [Rosuvastatin] Other (See Comments)    Muscle aches on 5 mg daily and 10 mg twice weekly  . Lipitor [Atorvastatin] Other (See Comments)    Muscle aches  . Livalo [Pitavastatin] Other (See Comments)       . Sulfa Antibiotics Rash  . Zetia [Ezetimibe] Other (See Comments)    Muscle aches  . Zocor [Simvastatin] Other (See Comments)    Muscle  aches     Past Medical History, Surgical history, Social history, and Family History were reviewed and updated.  Review of Systems: Review of Systems  Constitutional: Negative.   HENT: Negative.   Eyes: Negative.   Respiratory: Negative.   Cardiovascular: Negative.   Gastrointestinal: Negative.   Genitourinary: Negative.   Musculoskeletal: Negative.   Skin: Negative.   Neurological: Negative.   Endo/Heme/Allergies: Negative.   Psychiatric/Behavioral: Negative.     Physical Exam:  weight is 188 lb 4 oz (85.4 kg). Her oral temperature is 97.9 F (36.6 C). Her blood pressure is 156/58 (abnormal) and her pulse is 70. Her respiration is 18 and oxygen saturation is 98%.   Physical Exam Vitals reviewed.  HENT:     Head: Normocephalic and atraumatic.  Eyes:     Pupils: Pupils are equal, round, and reactive to light.  Cardiovascular:     Rate and Rhythm: Normal rate and regular rhythm.     Heart sounds: Normal heart sounds.  Pulmonary:     Effort: Pulmonary effort is normal.     Breath sounds: Normal breath sounds.  Abdominal:     General: Bowel sounds are normal.     Palpations: Abdomen is soft.  Musculoskeletal:        General: No tenderness or deformity. Normal range of motion.     Cervical back: Normal range of motion.  Lymphadenopathy:     Cervical: No cervical adenopathy.  Skin:    General: Skin is warm and dry.     Findings: No erythema or rash.  Neurological:     Mental Status: She is alert and oriented to person, place, and time.  Psychiatric:        Behavior: Behavior normal.        Thought Content: Thought content normal.        Judgment: Judgment normal.      Lab Results  Component Value Date   WBC 8.0 12/27/2019   HGB 14.0 12/27/2019   HCT 41.7 12/27/2019   MCV 91.9 12/27/2019   PLT 147 (L) 12/27/2019     Chemistry      Component Value Date/Time   NA 139 12/27/2019 1133   NA 146 (H) 11/13/2019 1552   NA 147 (H) 01/22/2017 1451   NA 140  12/31/2015 1052   K 3.8 12/27/2019 1133   K 4.9 (H) 01/22/2017 1451   K 4.2 12/31/2015 1052   CL 103 12/27/2019 1133   CL 106 01/22/2017 1451   CO2 26 12/27/2019 1133   CO2 29 01/22/2017 1451   CO2 23 12/31/2015 1052   BUN 20 12/27/2019 1133  BUN 17 11/13/2019 1552   BUN 23 (H) 01/22/2017 1451   BUN 21.4 12/31/2015 1052   CREATININE 1.27 (H) 12/27/2019 1133   CREATININE 1.6 (H) 01/22/2017 1451   CREATININE 1.4 (H) 12/31/2015 1052      Component Value Date/Time   CALCIUM 10.1 12/27/2019 1133   CALCIUM 10.3 01/22/2017 1451   CALCIUM 9.7 12/31/2015 1052   ALKPHOS 76 12/27/2019 1133   ALKPHOS 65 01/22/2017 1451   ALKPHOS 87 12/31/2015 1052   AST 21 12/27/2019 1133   AST 31 12/31/2015 1052   ALT 18 12/27/2019 1133   ALT 25 01/22/2017 1451   ALT 36 12/31/2015 1052   BILITOT 0.4 12/27/2019 1133   BILITOT 0.42 12/31/2015 1052      Impression and Plan: Carmen [name] is 79 year old white female with hemachromatosis. She is a double heterozygote.  I have to see what her iron studies look like.  Hopefully, she will not need to be phlebotomized.  I just do not think that hemochromatosis will be a problem for her.  Again her blood sugars are more of an issue than anything else.  I just do not think this is related to the hemochromatosis since we have her iron levels under good control.   Marland Kitchen Volanda Napoleon, MD 10/6/202112:32 PM

## 2019-12-28 LAB — IRON AND TIBC
Iron: 84 ug/dL (ref 41–142)
Saturation Ratios: 30 % (ref 21–57)
TIBC: 284 ug/dL (ref 236–444)
UIBC: 200 ug/dL (ref 120–384)

## 2019-12-28 LAB — FERRITIN: Ferritin: 122 ng/mL (ref 11–307)

## 2019-12-29 ENCOUNTER — Other Ambulatory Visit: Payer: Self-pay

## 2019-12-29 ENCOUNTER — Ambulatory Visit (INDEPENDENT_AMBULATORY_CARE_PROVIDER_SITE_OTHER): Payer: Medicare Other | Admitting: Internal Medicine

## 2019-12-29 ENCOUNTER — Encounter: Payer: Self-pay | Admitting: Internal Medicine

## 2019-12-29 VITALS — BP 130/80 | HR 65 | Ht 64.0 in | Wt 188.1 lb

## 2019-12-29 DIAGNOSIS — E1142 Type 2 diabetes mellitus with diabetic polyneuropathy: Secondary | ICD-10-CM | POA: Diagnosis not present

## 2019-12-29 DIAGNOSIS — E1122 Type 2 diabetes mellitus with diabetic chronic kidney disease: Secondary | ICD-10-CM | POA: Diagnosis not present

## 2019-12-29 DIAGNOSIS — N1831 Chronic kidney disease, stage 3a: Secondary | ICD-10-CM

## 2019-12-29 NOTE — Progress Notes (Signed)
Name: Carmen [name]  Age/ Sex: 79 y.o., female   MRN/ DOB: [id number], May 28, 1940     PCP: Claretta Fraise, MD   Reason for Endocrinology Evaluation: Type 2 Diabetes Mellitus  Initial Endocrine Consultative Visit: 09/14/2018    PATIENT IDENTIFIER: Carmen [name] is a 79 y.o. female with a past medical history of HTN, PVC, neuromuscular disorder, hemochromatosis and CAD (S/P CABG) . The patient has followed with Endocrinology clinic since 09/14/2018 for consultative assistance with management of her diabetes.  DIABETIC HISTORY:  [name] was diagnosed with T2DM in 2016. Has tried oral glycemic agents in the past,Januvia- swelling , Trulicity - nausea , metformin - elevated LFT's . Her hemoglobin A1c has ranged from 6.6% in 2016, peaking at 8.6% in 2020.  Wilder Glade stopped 10/2018 due to vertigo   Rybelsus started 2/24  SUBJECTIVE:   During the last visit (08/23/2019): A1c was 7.3 %. We continued Glipizide and increased Rybelsus     Today (12/29/2019): Carmen [name] is here for a 3 month follow up on diabetes management.  She checks her blood sugars 2 times daily, preprandial to breakfast. The patient has not had hypoglycemic episodes since the last clinic visit.     HOME DIABETES REGIMEN:   Glipizide 10 mg 2 tabs before breakfast and 1 tablet before supper   Rybelsus 7 mg daily      GLUCOSE LOG:  Did not bring   Fasting 120-150  Mid afternoon 160-170 mg/dL    DIABETIC COMPLICATIONS: Microvascular complications:   CKD III, neuropathy   Denies: retinopathy   Last eye exam: Completed 11/2018  Macrovascular complications:   CABG  Denies: PVD, CVA     HISTORY:  Past Medical History:  Past Medical History:  Diagnosis Date  . Arthritis   . Asymptomatic PVCs   . Bilateral shoulder pain   . CAD (coronary artery disease)   . Cancer ([place]) 11/2015   melanomax4  right upper arm  . Chronic renal insufficiency, stage 3 (moderate) (HCC)   . Diabetes  mellitus without complication ([place])   . Diverticulosis   . Endometrial polyp   . External hemorrhoids   . Gout   . Hemochromatosis, hereditary ([place]) 11/24/2012  . History of anemia   . History of duodenal ulcer 1990  . Hyperlipidemia   . Hypertension   . Internal hemorrhoids   . Jaundice    age 86 or 59  . Myocardial infarction ([place]) 2007  . Neuromuscular disorder ([place])    peripheral neuropathy  . PMB (postmenopausal bleeding)   . PONV (postoperative nausea and vomiting)   . Prolapse of female pelvic organs    uses pessary  . Seizures ([place])    had one at Dr. Antonieta Pert office after getting blood drawn  . Tick bite 08/12/2017   had 3 bites  . Vitamin D deficiency    Past Surgical History:  Past Surgical History:  Procedure Laterality Date  . BREAST SURGERY     left breast lump--benign  . CARDIAC CATHETERIZATION  06/03/2005  . COLONOSCOPY  11/28/2001  . CORONARY ARTERY BYPASS GRAFT  2007   x2 Dr. Roxan Hockey, LIMA to LAD, SVG to PDA  . DILATATION & CURETTAGE/HYSTEROSCOPY WITH MYOSURE N/A 09/28/2017   Procedure: DILATATION & CURETTAGE/HYSTEROSCOPY WITH MYOSURE;  Surgeon: Molli Posey, MD;  Location: [facility];  Service: Gynecology;  Laterality: N/A;  . LIPOMA EXCISION     back  . UPPER GI ENDOSCOPY  01/20/1989    Social History:  reports that she has never smoked. She has never used smokeless tobacco. She reports current alcohol use. She reports that she does not use drugs. Family History:  Family History  Problem Relation Age of Onset  . Stroke Mother   . Hypertension Mother   . Neuropathy Mother   . Stroke Father   . Hypertension Father   . Diabetes Father   . Cancer Sister 23       sarcoma  . Diabetes Brother   . Arthritis Sister   . Arthritis Sister   . Lupus Sister   . Hemachromatosis Sister   . Arthritis Sister   . Hemachromatosis Sister   . Hemachromatosis Sister   . Diverticulitis Sister   . Diabetes Brother   . Diabetes Brother   .  Hemachromatosis Brother      HOME MEDICATIONS: Allergies as of 12/29/2019      Reactions   Cymbalta [duloxetine Hcl] Other (See Comments)   Profuse sweating   Januvia [sitagliptin] Swelling   Meloxicam Nausea And Vomiting   Trulicity [dulaglutide] Nausea Only   Diclofenac Diarrhea   Farxiga [dapagliflozin] Other (See Comments)   Dizziness   Shellfish Allergy Other (See Comments)   Gout   Tape    Causes skin irritation   Crestor [rosuvastatin] Other (See Comments)   Muscle aches on 5 mg daily and 10 mg twice weekly   Lipitor [atorvastatin] Other (See Comments)   Muscle aches   Livalo [pitavastatin] Other (See Comments)      Sulfa Antibiotics Rash   Zetia [ezetimibe] Other (See Comments)   Muscle aches   Zocor [simvastatin] Other (See Comments)   Muscle aches       Medication List       Accurate as of December 29, 2019  1:15 PM. If you have any questions, ask your nurse or doctor.        Accu-Chek [employer] Kit Use to check Blood Sugars   allopurinol 100 MG tablet Commonly known as: ZYLOPRIM TAKE  (1)  TABLET TWICE A DAY.   aspirin 81 MG tablet Take 81 mg by mouth daily.   colchicine 0.6 MG tablet TAKE (1) TABLET DAILY AS NEEDED.   fluticasone 50 MCG/ACT nasal spray Commonly known as: FLONASE SPRAY 1 SPRAY IN EACH NOSTRIL ONCE DAILY.   furosemide 20 MG tablet Commonly known as: LASIX TAKE 1 TABLET DAILY   glipiZIDE 10 MG tablet Commonly known as: GLUCOTROL TAKE 2 TABLETS BEFORE BREAKFAST AND 1 TABLET DAILY BEFORE SUPPER   glucose blood test strip Commonly known as: ONE TOUCH ULTRA TEST CHECK BLOOD SUGAR 2 TIMES A DAY   icosapent Ethyl 1 g capsule Commonly known as: Vascepa Take 2 capsules (2 g total) by mouth 2 (two) times daily.   meclizine 12.5 MG tablet Commonly known as: ANTIVERT Take 1 tablet (12.5 mg total) by mouth 3 (three) times daily as needed for dizziness.   metoprolol tartrate 25 MG tablet Commonly known as: LOPRESSOR TAKE   (1)  TABLET TWICE A DAY.   olmesartan 20 MG tablet Commonly known as: BENICAR TAKE 1 TABLET DAILY   Praluent 150 MG/ML [place] Generic drug: Alirocumab Inject 1 Dose into the skin every 14 (fourteen) days.   pregabalin 50 MG capsule Commonly known as: LYRICA TAKE (1) CAPSULE TWICE DAILY.   Restasis 0.05 % ophthalmic emulsion Generic drug: cycloSPORINE   Rybelsus 7 MG Tabs Generic drug: Semaglutide Take 7 mg by mouth daily before breakfast.   Vitamin D (Ergocalciferol) 1.25  MG (50000 UNIT) Caps capsule Commonly known as: DRISDOL Take 1 capsule (50,000 Units total) by mouth 2 (two) times a week.   Voltaren 1 % Gel Generic drug: diclofenac Sodium APPLY 4 GRAMS TO AFFECTED AREA(S) 4 TIMES A DAY AS NEEDED What changed: Another medication with the same name was removed. Continue taking this medication, and follow the directions you see here. Changed by: Dorita Sciara, MD        OBJECTIVE:   Vital Signs: BP 130/80   Pulse 65   Ht _0  (1.626 m)   Wt 188 lb 2 oz (85.3 kg)   SpO2 98%   BMI 32.29 kg/m   Wt Readings from Last 3 Encounters:  12/29/19 188 lb 2 oz (85.3 kg)  12/27/19 188 lb 4 oz (85.4 kg)  11/13/19 190 lb 6 oz (86.4 kg)     Exam: General: Pt appears well and is in NAD  Lungs: Clear with good BS bilat with no rales, rhonchi, or wheezes  Heart: RRR with normal S1 and S2 and no gallops; no murmurs; no rub  Extremities: No pretibial edema.  Skin: Normal texture and temperature to palpation.   Neuro: MS is good with appropriate affect, pt is alert and Ox3     DM foot exam: 12/29/2019  The skin of the feet is intact without sores or ulcerations. The pedal pulses are 2+ on right and 2+ on left. The sensation is decreased to a screening 5.07, 10 gram monofilament bilaterally    DATA REVIEWED:  Lab Results  Component Value Date   HGBA1C 6.8 11/13/2019   HGBA1C 7.3 (A) 08/23/2019   HGBA1C 7.9 (A) 05/17/2019   Lab Results  Component Value  Date   MICROALBUR <0.7 01/26/2019   LDLCALC 56 11/13/2019   CREATININE 1.27 (H) 12/27/2019    Lab Results  Component Value Date   CHOL 145 11/13/2019   HDL 38 (L) 11/13/2019   LDLCALC 56 11/13/2019   LDLDIRECT 75 12/02/2018   TRIG 333 (H) 11/13/2019   CHOLHDL 3.8 11/13/2019         ASSESSMENT / PLAN / RECOMMENDATIONS:   1) Type 2 Diabetes Mellitus, Sub-Optimally  controlled, With neuropathic and CKD III complications - Most recent A1c of 6.8 %. Goal A1c < 7.5 %.     - A1c continues to improve, I have congratulated her on optimal glucose control  - She is tolerating Rybelsus without side effects, will continue current regimen and emphasized importance of low carb diet   MEDICATIONS: - Continue Glipizide 10 mg , 2 tabs with Breakfast and 1 tablet with Supper  - Continue  Rybelsus 7 mg daily with breakfast   EDUCATION / INSTRUCTIONS:  BG monitoring instructions: Patient is instructed to check her blood sugars 3 times a week fasting .  Call [place] [facility] clinic if: BG persistently < 70  . I reviewed the Rule of 15 for the treatment of hypoglycemia in detail with the patient. Literature supplied.     F/U in 4 months    Signed electronically by: Mack Guise, MD  [facility]  [address] [place],  [address] Phone: [phone] FAX: [phone]   CC: [address], [place] [address] Phone: [phone]  Fax: [phone]  Return to Endocrinology clinic as below: Future Appointments  Date Time Provider [place]  01/02/2020 11:00 AM [facility] B3 [facility] None  01/18/2020  9:00 AM [facility] SOCIAL WORK  [facility] None  06/26/2020  1:15 PM [facility] LAB [facility] None  06/26/2020  1:45 PM Ennever, Rudell Cobb, MD [facility] None

## 2019-12-29 NOTE — Patient Instructions (Signed)
-   Glipizide 10 mg, 2 tablets before breakfast and 1 tablet before supper   - Continue  Rybelsus 7 mg daily with Breakfast       - HOW TO TREAT LOW BLOOD SUGARS (Blood sugar LESS THAN 70 MG/DL)  Please follow the RULE OF 15 for the treatment of hypoglycemia treatment (when your (blood sugars are less than 70 mg/dL)    STEP 1: Take 15 grams of carbohydrates when your blood sugar is low, which includes:   3-4 GLUCOSE TABS  OR  3-4 OZ OF JUICE OR REGULAR SODA OR  ONE TUBE OF GLUCOSE GEL     STEP 2: RECHECK blood sugar in 15 MINUTES STEP 3: If your blood sugar is still low at the 15 minute recheck --> then, go back to STEP 1 and treat AGAIN with another 15 grams of carbohydrates.

## 2020-01-02 ENCOUNTER — Inpatient Hospital Stay: Payer: Medicare Other

## 2020-01-02 ENCOUNTER — Other Ambulatory Visit: Payer: Self-pay

## 2020-01-02 DIAGNOSIS — Z7982 Long term (current) use of aspirin: Secondary | ICD-10-CM | POA: Diagnosis not present

## 2020-01-02 DIAGNOSIS — Z79899 Other long term (current) drug therapy: Secondary | ICD-10-CM | POA: Diagnosis not present

## 2020-01-02 DIAGNOSIS — E119 Type 2 diabetes mellitus without complications: Secondary | ICD-10-CM | POA: Diagnosis not present

## 2020-01-02 NOTE — Patient Instructions (Signed)

## 2020-01-02 NOTE — Progress Notes (Signed)
Phlebotomy today totaled 280 grams.  Tolerated well.  Patient was stable upon discharge.

## 2020-01-03 DIAGNOSIS — Z85820 Personal history of malignant melanoma of skin: Secondary | ICD-10-CM | POA: Diagnosis not present

## 2020-01-03 DIAGNOSIS — D2271 Melanocytic nevi of right lower limb, including hip: Secondary | ICD-10-CM | POA: Diagnosis not present

## 2020-01-03 DIAGNOSIS — L821 Other seborrheic keratosis: Secondary | ICD-10-CM | POA: Diagnosis not present

## 2020-01-03 DIAGNOSIS — L820 Inflamed seborrheic keratosis: Secondary | ICD-10-CM | POA: Diagnosis not present

## 2020-01-03 DIAGNOSIS — Z85828 Personal history of other malignant neoplasm of skin: Secondary | ICD-10-CM | POA: Diagnosis not present

## 2020-01-06 ENCOUNTER — Other Ambulatory Visit: Payer: Self-pay | Admitting: Internal Medicine

## 2020-01-18 ENCOUNTER — Ambulatory Visit: Payer: Medicare Other | Admitting: Licensed Clinical Social Worker

## 2020-01-18 DIAGNOSIS — Z951 Presence of aortocoronary bypass graft: Secondary | ICD-10-CM

## 2020-01-18 DIAGNOSIS — E559 Vitamin D deficiency, unspecified: Secondary | ICD-10-CM

## 2020-01-18 DIAGNOSIS — E782 Mixed hyperlipidemia: Secondary | ICD-10-CM

## 2020-01-18 DIAGNOSIS — E1159 Type 2 diabetes mellitus with other circulatory complications: Secondary | ICD-10-CM

## 2020-01-18 DIAGNOSIS — M1990 Unspecified osteoarthritis, unspecified site: Secondary | ICD-10-CM

## 2020-01-18 DIAGNOSIS — N183 Chronic kidney disease, stage 3 unspecified: Secondary | ICD-10-CM

## 2020-01-18 NOTE — Chronic Care Management (AMB) (Signed)
Chronic Care Management    Clinical Social Work Follow Up Note  01/18/2020 Name: HELIA HAESE MRN: [id number] DOB: [birth year]  AARNA MIHALKO is a 79 y.o. year old female who is a primary care patient of Stacks, Cletus Gash, MD. The CCM team was consulted for assistance with [employer] .   Review of patient status, including review of consultants reports, other relevant assessments, and collaboration with appropriate care team members and the patient's provider was performed as part of comprehensive patient evaluation and provision of chronic care management services.    SDOH (Social Determinants of Health) assessments performed: No; risk for depression; risk for tobacco use; risk for stress; risk for physical inactivity    Chronic Care Management from 08/29/2019 in [place]  PHQ-9 Total Score 4     GAD 7 : Generalized Anxiety Score 08/29/2019  Nervous, Anxious, on Edge 0  Control/stop worrying 1  Worry too much - different things 0  Trouble relaxing 1  Restless 0  Easily annoyed or irritable 0  Afraid - awful might happen 0  Total GAD 7 Score 2  Anxiety Difficulty Somewhat difficult    Outpatient Encounter Medications as of 01/18/2020  Medication Sig   Alirocumab (PRALUENT) 150 MG/ML SOAJ Inject 1 Dose into the skin every 14 (fourteen) days.   allopurinol (ZYLOPRIM) 100 MG tablet TAKE  (1)  TABLET TWICE A DAY.   aspirin 81 MG tablet Take 81 mg by mouth daily.   colchicine 0.6 MG tablet TAKE (1) TABLET DAILY AS NEEDED.   fluticasone (FLONASE) 50 MCG/ACT nasal spray SPRAY 1 SPRAY IN EACH NOSTRIL ONCE DAILY.   furosemide (LASIX) 20 MG tablet TAKE 1 TABLET DAILY   glipiZIDE (GLUCOTROL) 10 MG tablet TAKE 2 TABLETS BEFORE BREAKFAST AND 1 TABLET DAILY BEFORE SUPPER   glucose blood (ONE TOUCH ULTRA TEST) test strip CHECK BLOOD SUGAR 2 TIMES A DAY   icosapent Ethyl (VASCEPA) 1 g capsule Take 2 capsules (2 g total) by mouth 2 (two) times  daily.   Lancets Misc. (ACCU-CHEK FASTCLIX LANCET) KIT Use to check Blood Sugars   meclizine (ANTIVERT) 12.5 MG tablet Take 1 tablet (12.5 mg total) by mouth 3 (three) times daily as needed for dizziness.   metoprolol tartrate (LOPRESSOR) 25 MG tablet TAKE  (1)  TABLET TWICE A DAY.   olmesartan (BENICAR) 20 MG tablet TAKE 1 TABLET DAILY   pregabalin (LYRICA) 50 MG capsule TAKE (1) CAPSULE TWICE DAILY.   RESTASIS 0.05 % ophthalmic emulsion    Semaglutide (RYBELSUS) 7 MG TABS Take 7 mg by mouth daily before breakfast.   Vitamin D, Ergocalciferol, (DRISDOL) 1.25 MG (50000 UT) CAPS capsule Take 1 capsule (50,000 Units total) by mouth 2 (two) times a week.   VOLTAREN 1 % GEL APPLY 4 GRAMS TO AFFECTED AREA(S) 4 TIMES A DAY AS NEEDED   No facility-administered encounter medications on file as of 01/18/2020.    Goals      Client will call LCSW in next 30 days to talk about her management of medical condtions and about her completion of ADLs (pt-stated)      CARE PLAN ENTRY   Current Barriers:   Vertigo issues in client with chronic diagnoses of HTN, HLD, Arthritis, S/P CABG X 2, Viatmin D Deficiency, CKD  Mobility issues  Clinical Social Work Clinical Goal(s):   LCSW to call client in next 30 days to talk with her about management of her medical condition and her completion of daily  ADLs  Interventions:  Talked with client about CCM program support  Talked with client about RNCM support with CCM program  Talked with client about pain issues of client  Talked with client about social support network of client (has  support from daughter)  Talked with client about ambulation needs of client (uses a cane to help her walk)  Talked with client about DME of client (has a tub seat, hand held shower, grab bars, low step shower)  Talked with client about vision challenges of client   Talked with client about her recent visit with endocrinologist  Talked with client about  relaxation activities (likes to read and sit on front porch, likes to work with flowers/roses)  Talked with client about her upcoming client appointments   Talked with client about her meals provision  Talked with client about her appointment this Saturday to receive COVID 19 booster shot in [place], [place]  Patient Self Care Activities:  Does ADLs independently Attends scheduled medical appointments Drives to appointments and drives to complete errands  Patient Self Care Deficits:   Balance issues  Ambulation issues  Vertigo issues  Initial goal documentation     Follow Up Plan:  LCSW to call client in next 4 weeks to talk with client about her management of medical issues and about her completion of ADLs  Norva Riffle.Aliou Mealey MSW, LCSW Licensed Clinical Social Worker [facility] Care Management [phone]

## 2020-01-18 NOTE — Patient Instructions (Addendum)
Licensed Clinical Education officer, museum Visit Information  Goals we discussed today:    Client will call LCSW in next 30 days to talk about her management of medical condtions and about her completion of ADLs (pt-stated)         CARE PLAN ENTRY   Current Barriers:   Vertigo issues in client with chronic diagnoses of HTN, HLD, Arthritis, S/P CABG X 2, Viatmin D Deficiency, CKD  Mobility issues  Clinical Social Work Clinical Goal(s):   LCSW to call client in next 30 days to talk with her about management of her medical condition and her completion of daily ADLs  Interventions:  Talked with client about CCM program support  Talked with client about RNCM support with CCM program  Talked with client about pain issues of client  Talked with client about social support network of client (has  support from daughter)  Talked with client about ambulation needs of client (uses a cane to help her walk)  Talked with client about DME of client (has a tub seat, hand held shower, grab bars, low step shower)  Talked with client about vision challenges of client   Talked with client about her recent visit with endocrinologist  Talked with client about relaxation activities (likes to read and sit on front porch, likes to work with flowers/roses)  Talked with client about her upcoming client appointments   Talked with client about her meals provision  Talked with client about her appointment this Saturday to receive COVID 19 booster shot in [place], [place]  Patient Self Care Activities:  Does ADLs independently Attends scheduled medical appointments Drives to appointments and drives to complete errands  Patient Self Care Deficits:   Balance issues  Ambulation issues  Vertigo issues  Initial goal documentation     Follow Up Plan: LCSW to call client in next 4 weeks to talk with client about her management of medical issues and about her completion of ADLs  Materials  Provided: No  The patient verbalized understanding of instructions provided today and declined a print copy of patient instruction materials.   Carmen [name] MSW, LCSW Licensed Clinical Social Worker [facility] Care Management [phone]

## 2020-01-20 ENCOUNTER — Ambulatory Visit: Payer: Medicare Other | Attending: Internal Medicine

## 2020-01-20 DIAGNOSIS — Z23 Encounter for immunization: Secondary | ICD-10-CM

## 2020-01-20 NOTE — Progress Notes (Signed)
   Covid-19 Vaccination Clinic  Name:  Carmen [name]    MRN: [id number] DOB: April 21, 1940  01/20/2020  [name] was observed post Covid-19 immunization for 15 minutes without incident. She was provided with Vaccine Information Sheet and instruction to access the V-Safe system.   [name] was instructed to call 911 with any severe reactions post vaccine: Marland Kitchen Difficulty breathing  . Swelling of face and throat  . A fast heartbeat  . A bad rash all over body  . Dizziness and weakness

## 2020-02-05 ENCOUNTER — Other Ambulatory Visit: Payer: Self-pay | Admitting: Family Medicine

## 2020-02-05 ENCOUNTER — Other Ambulatory Visit: Payer: Self-pay | Admitting: Internal Medicine

## 2020-02-05 DIAGNOSIS — I152 Hypertension secondary to endocrine disorders: Secondary | ICD-10-CM

## 2020-02-05 DIAGNOSIS — Z951 Presence of aortocoronary bypass graft: Secondary | ICD-10-CM

## 2020-02-05 DIAGNOSIS — E1159 Type 2 diabetes mellitus with other circulatory complications: Secondary | ICD-10-CM

## 2020-02-19 ENCOUNTER — Other Ambulatory Visit: Payer: Self-pay | Admitting: Family Medicine

## 2020-02-20 ENCOUNTER — Ambulatory Visit: Payer: Medicare Other | Admitting: Licensed Clinical Social Worker

## 2020-02-20 DIAGNOSIS — N183 Chronic kidney disease, stage 3 unspecified: Secondary | ICD-10-CM

## 2020-02-20 DIAGNOSIS — M1990 Unspecified osteoarthritis, unspecified site: Secondary | ICD-10-CM

## 2020-02-20 DIAGNOSIS — I152 Hypertension secondary to endocrine disorders: Secondary | ICD-10-CM

## 2020-02-20 DIAGNOSIS — E559 Vitamin D deficiency, unspecified: Secondary | ICD-10-CM

## 2020-02-20 DIAGNOSIS — E782 Mixed hyperlipidemia: Secondary | ICD-10-CM

## 2020-02-20 DIAGNOSIS — Z951 Presence of aortocoronary bypass graft: Secondary | ICD-10-CM

## 2020-02-20 NOTE — Chronic Care Management (AMB) (Signed)
  Chronic Care Management    Clinical Social Work Follow Up Note  02/20/2020 Name: Carmen [name] MRN: [id number] DOB: 1940-09-22  Carmen [name] is a 79 y.o. year old female who is a primary care patient of Stacks, Cletus Gash, MD. The CCM team was consulted for assistance with [employer] .   Review of patient status, including review of consultants reports, other relevant assessments, and collaboration with appropriate care team members and the patient's provider was performed as part of comprehensive patient evaluation and provision of chronic care management services.    SDOH (Social Determinants of Health) assessments performed: No; risk for depression; risk for tobacco use; risk for stress; risk for physical inactivity    Chronic Care Management from 08/29/2019 in [place]  PHQ-9 Total Score 4       GAD 7 : Generalized Anxiety Score 08/29/2019  Nervous, Anxious, on Edge 0  Control/stop worrying 1  Worry too much - different things 0  Trouble relaxing 1  Restless 0  Easily annoyed or irritable 0  Afraid - awful might happen 0  Total GAD 7 Score 2  Anxiety Difficulty Somewhat difficult    Outpatient Encounter Medications as of 02/20/2020  Medication Sig  . Alirocumab (PRALUENT) 150 MG/ML SOAJ Inject 1 Dose into the skin every 14 (fourteen) days.  Marland Kitchen allopurinol (ZYLOPRIM) 100 MG tablet TAKE  (1)  TABLET TWICE A DAY.  Marland Kitchen aspirin 81 MG tablet Take 81 mg by mouth daily.  . colchicine 0.6 MG tablet TAKE (1) TABLET DAILY AS NEEDED.  . fluticasone (FLONASE) 50 MCG/ACT nasal spray SPRAY 1 SPRAY IN EACH NOSTRIL ONCE DAILY.  . furosemide (LASIX) 20 MG tablet TAKE 1 TABLET DAILY  . glipiZIDE (GLUCOTROL) 10 MG tablet TAKE 2 TABLETS BEFORE BREAKFAST AND 1 TABLET DAILY BEFORE SUPPER  . glucose blood (ONE TOUCH ULTRA TEST) test strip CHECK BLOOD SUGAR 2 TIMES A DAY  . icosapent Ethyl (VASCEPA) 1 g capsule Take 2 capsules (2 g total) by mouth 2 (two) times  daily.  . Lancets Misc. (ACCU-CHEK FASTCLIX LANCET) KIT Use to check Blood Sugars  . meclizine (ANTIVERT) 12.5 MG tablet Take 1 tablet (12.5 mg total) by mouth 3 (three) times daily as needed for dizziness.  . metoprolol tartrate (LOPRESSOR) 25 MG tablet TAKE  (1)  TABLET TWICE A DAY.  Marland Kitchen olmesartan (BENICAR) 20 MG tablet TAKE 1 TABLET DAILY  . pregabalin (LYRICA) 50 MG capsule TAKE (1) CAPSULE TWICE DAILY.  Marland Kitchen RESTASIS 0.05 % ophthalmic emulsion   . Semaglutide (RYBELSUS) 7 MG TABS Take 7 mg by mouth daily before breakfast.  . Vitamin D, Ergocalciferol, (DRISDOL) 1.25 MG (50000 UT) CAPS capsule Take 1 capsule (50,000 Units total) by mouth 2 (two) times a week.  . VOLTAREN 1 % GEL APPLY 4 GRAMS TO AFFECTED AREA(S) 4 TIMES A DAY AS NEEDED   No facility-administered encounter medications on file as of 02/20/2020.    LCSW called client home phone number and cell phone number several times today but LCSW was not able to speak via phone with client today; however, LCSW did leave phone message for Carmen [name] requesting that she please return call to LCSW at [phone]  Follow Up Plan: LCSW to call client in next 4 weeks to talk with client about her management of medical issues and about her completion of ADLs  Norva Riffle.Jilliam Bellmore MSW, LCSW Licensed Clinical Social Worker [facility] Care Management [phone]

## 2020-02-20 NOTE — Patient Instructions (Addendum)
Licensed Clinical Social Worker Visit Information  Materials Provided: No  02/20/2020  Name: Carmen [name]     MRN: [id number]       DOB: 05-22-1940  Carmen [name] is a 79 y.o. year old female who is a primary care patient of Stacks, Cletus Gash, MD. The CCM team was consulted for assistance with [employer] .   Review of patient status, including review of consultants reports, other relevant assessments, and collaboration with appropriate care team members and the patient's provider was performed as part of comprehensive patient evaluation and provision of chronic care management services.    SDOH (Social Determinants of Health) assessments performed: No; risk for depression; risk for tobacco use; risk for stress; risk for physical inactivity  LCSW called client home phone number and cell phone number several times today but LCSW was not able to speak via phone with client today; however, LCSW did leave phone message for Carmen [name] requesting that she please return call to LCSW at [phone]  Follow Up Plan:LCSW to call client in next 4 weeks to talk with client about her management of medical issues and about her completion of ADLs   LCSW was not able to speak via phone with client today; thus the client was not able to verbalize understanding of instructions provided today and was not able to accept or decline a print copy of patient instruction materials.   Carmen [name].Carmen [name] MSW, LCSW Licensed Clinical Social Worker [facility] [phone]

## 2020-03-04 ENCOUNTER — Telehealth: Payer: Self-pay | Admitting: Internal Medicine

## 2020-03-04 NOTE — Telephone Encounter (Signed)
Request Reference Number: [id number]. PRALUENT INJ 150MG /ML is approved through 03/22/2021

## 2020-03-04 NOTE — Telephone Encounter (Signed)
PA for praluent submitted via CMM (Key: [id number])

## 2020-03-18 ENCOUNTER — Other Ambulatory Visit: Payer: Self-pay | Admitting: Family Medicine

## 2020-03-18 ENCOUNTER — Other Ambulatory Visit: Payer: Self-pay | Admitting: Internal Medicine

## 2020-03-18 DIAGNOSIS — E1140 Type 2 diabetes mellitus with diabetic neuropathy, unspecified: Secondary | ICD-10-CM

## 2020-03-29 ENCOUNTER — Telehealth: Payer: Medicare Other

## 2020-04-04 ENCOUNTER — Other Ambulatory Visit: Payer: Self-pay | Admitting: Internal Medicine

## 2020-04-12 ENCOUNTER — Ambulatory Visit (INDEPENDENT_AMBULATORY_CARE_PROVIDER_SITE_OTHER): Payer: Medicare Other | Admitting: *Deleted

## 2020-04-12 DIAGNOSIS — Z0000 Encounter for general adult medical examination without abnormal findings: Secondary | ICD-10-CM | POA: Diagnosis not present

## 2020-04-12 NOTE — Progress Notes (Signed)
MEDICARE ANNUAL WELLNESS VISIT  04/12/2020  Telephone Visit Disclaimer This Medicare AWV was conducted by telephone due to national recommendations for restrictions regarding the COVID-19 Pandemic (e.g. social distancing).  I verified, using two identifiers, that I am speaking with Carmen [name] or their authorized healthcare agent. I discussed the limitations, risks, security, and privacy concerns of performing an evaluation and management service by telephone and the potential availability of an in-person appointment in the future. The patient expressed understanding and agreed to proceed.  Location of Patient: Home Location of Provider (nurse):  [facility]  Subjective:    Carmen [name] is a 80 y.o. female patient of Stacks, Cletus Gash, MD who had a Medicare Annual Wellness Visit today via telephone. Marlyss is Retired and lives alone. she has 3 children. she reports that she is socially active and does interact with friends/family regularly. she is not physically active and enjoys playing computer games, watching tv, and gardening.  Patient Care Team: Claretta Fraise, MD as PCP - General (Family Medicine) Marin Olp Rudell Cobb, MD as Consulting Physician (Oncology) Molli Posey, MD as Consulting Physician (Obstetrics and Gynecology) Debara Pickett Nadean Corwin, MD as Consulting Physician (Cardiology) Ilean China, RN as Registered Nurse Shea Evans, Norva Riffle, LCSW as [place] Management (Licensed Clinical Social Worker)  Advanced Directives 04/12/2020 12/27/2019 12/23/2018 11/10/2018 10/27/2018 09/22/2018 11/08/2017  Does Patient Have a Medical Advance Directive? _0  No No  Does patient want to make changes to medical advance directive? - - No - Patient declined - - - -  Would patient like information on creating a medical advance directive? No - Patient declined No - Patient declined - No - Patient declined - No - Patient declined Yes  (MAU/Ambulatory/Procedural Areas - Information given)    Hospital Utilization Over the Past 12 Months: # of hospitalizations or ER visits: 0 # of surgeries: 0  Review of Systems    Patient reports that her overall health is better compared to last year. She states that her diabetes is better controlled and it has really helped make her feel better.  History obtained from chart review and the patient  Patient Reported Readings (BP, Pulse, CBG, Weight, etc) CBG 128  Pain Assessment Pain : No/denies pain     Current Medications & Allergies (verified) Allergies as of 04/12/2020      Reactions   Cymbalta [duloxetine Hcl] Other (See Comments)   Profuse sweating   Januvia [sitagliptin] Swelling   Meloxicam Nausea And Vomiting   Trulicity [dulaglutide] Nausea Only   Diclofenac Diarrhea   Farxiga [dapagliflozin] Other (See Comments)   Dizziness   Shellfish Allergy Other (See Comments)   Gout   Tape    Causes skin irritation   Crestor [rosuvastatin] Other (See Comments)   Muscle aches on 5 mg daily and 10 mg twice weekly   Lipitor [atorvastatin] Other (See Comments)   Muscle aches   Livalo [pitavastatin] Other (See Comments)      Sulfa Antibiotics Rash   Zetia [ezetimibe] Other (See Comments)   Muscle aches   Zocor [simvastatin] Other (See Comments)   Muscle aches       Medication List       Accurate as of April 12, 2020  2:58 PM. If you have any questions, ask your nurse or doctor.        Accu-Chek [employer] Kit Use to check Blood Sugars   allopurinol 100 MG tablet Commonly known as:  ZYLOPRIM TAKE  (1)  TABLET TWICE A DAY.   aspirin 81 MG tablet Take 81 mg by mouth daily.   colchicine 0.6 MG tablet TAKE (1) TABLET DAILY AS NEEDED.   fluticasone 50 MCG/ACT nasal spray Commonly known as: FLONASE SPRAY 1 SPRAY IN EACH NOSTRIL ONCE DAILY.   furosemide 20 MG tablet Commonly known as: LASIX TAKE 1 TABLET DAILY   glipiZIDE 10 MG tablet Commonly  known as: GLUCOTROL TAKE 2 TABLETS BEFORE BREAKFAST AND 1 TABLET DAILY BEFORE SUPPER   glucose blood test strip Commonly known as: ONE TOUCH ULTRA TEST CHECK BLOOD SUGAR 2 TIMES A DAY   icosapent Ethyl 1 g capsule Commonly known as: Vascepa Take 2 capsules (2 g total) by mouth 2 (two) times daily.   meclizine 12.5 MG tablet Commonly known as: ANTIVERT Take 1 tablet (12.5 mg total) by mouth 3 (three) times daily as needed for dizziness.   metoprolol tartrate 25 MG tablet Commonly known as: LOPRESSOR TAKE  (1)  TABLET TWICE A DAY.   olmesartan 20 MG tablet Commonly known as: BENICAR TAKE 1 TABLET DAILY   Praluent 150 MG/ML Soaj Generic drug: Alirocumab Inject 1 Dose into the skin every 14 (fourteen) days.   pregabalin 50 MG capsule Commonly known as: LYRICA TAKE (1) CAPSULE TWICE DAILY.   Restasis 0.05 % ophthalmic emulsion Generic drug: cycloSPORINE   Rybelsus 7 MG Tabs Generic drug: Semaglutide TAKE 1 TABLET DAILY BEFORE BREAKFAST   Vitamin D (Ergocalciferol) 1.25 MG (50000 UNIT) Caps capsule Commonly known as: DRISDOL Take 1 capsule (50,000 Units total) by mouth 2 (two) times a week.   Voltaren 1 % Gel Generic drug: diclofenac Sodium APPLY 4 GRAMS TO AFFECTED AREA(S) 4 TIMES A DAY AS NEEDED       History (reviewed): Past Medical History:  Diagnosis Date   Arthritis    Asymptomatic PVCs    Bilateral shoulder pain    CAD (coronary artery disease)    Cancer ([place]) 11/2015   [id number]  right upper arm   Chronic renal insufficiency, stage 3 (moderate) ([place])    Diabetes mellitus without complication ([place])    Diverticulosis    Endometrial polyp    External hemorrhoids    Gout    Hemochromatosis, hereditary ([place]) 11/24/2012   History of anemia    History of duodenal ulcer 1990   Hyperlipidemia    Hypertension    Internal hemorrhoids    Jaundice    age 57 or 64   Myocardial infarction ([place]) 2007   Neuromuscular disorder ([place])     peripheral neuropathy   PMB (postmenopausal bleeding)    PONV (postoperative nausea and vomiting)    Prolapse of female pelvic organs    uses pessary   Seizures ([place])    had one at Dr. Antonieta Pert office after getting blood drawn   Tick bite 08/12/2017   had 3 bites   Vitamin D deficiency    Past Surgical History:  Procedure Laterality Date   BREAST SURGERY     left breast lump--benign   CARDIAC CATHETERIZATION  06/03/2005   COLONOSCOPY  11/28/2001   CORONARY ARTERY BYPASS GRAFT  2007   x2 Dr. Roxan Hockey, [place] to LAD, SVG to [place] N/A 09/28/2017   Procedure: [place];  Surgeon: Molli Posey, MD;  Location: [facility];  Service: Gynecology;  Laterality: N/A;   LIPOMA EXCISION     back   UPPER GI ENDOSCOPY  01/20/1989   Family History  Problem Relation Age of Onset   Stroke Mother    Hypertension Mother    Neuropathy Mother    Stroke Father    Hypertension Father    Diabetes Father    Cancer Sister 40       sarcoma   Diabetes Brother    Arthritis Sister    Arthritis Sister    Lupus Sister    Hemachromatosis Sister    Arthritis Sister    Hemachromatosis Sister    Hemachromatosis Sister    Diverticulitis Sister    Diabetes Brother    Diabetes Brother    Hemachromatosis Brother    Lung cancer Brother    Social History   Socioeconomic History   Marital status: Widowed    Spouse name: Not on file   Number of children: 3   Years of education: 12   Highest education level: High school graduate  Occupational History   Occupation: retired  Tobacco Use   Smoking status: Never Smoker   Smokeless tobacco: Never Used   Tobacco comment: never used tobacco  Vaping Use   Vaping Use: Never used  Substance and Sexual Activity   Alcohol use: Yes    Alcohol/week: 0.0 standard drinks    Comment: rare wine   Drug use: No    Sexual activity: Not Currently    Birth control/protection: Post-menopausal  Other Topics Concern   Not on file  Social History Narrative   Not on file   Social Determinants of Health   Financial Resource Strain: Not on file  Food Insecurity: Not on file  Transportation Needs: Not on file  Physical Activity: Not on file  Stress: Not on file  Social Connections: Not on file    Activities of Daily Living In your present state of health, do you have any difficulty performing the following activities: 04/12/2020  Hearing? N  Comment wears hearing aids  Vision? N  Comment wears glasses  Difficulty concentrating or making decisions? N  Walking or climbing stairs? N  Dressing or bathing? N  Doing errands, shopping? N  Preparing Food and eating ? N  Using the Toilet? N  In the past six months, have you accidently leaked urine? N  Do you have problems with loss of bowel control? N  Managing your Medications? N  Managing your Finances? N  Housekeeping or managing your Housekeeping? N  Some recent data might be hidden    Patient Education/ Literacy How often do you need to have someone help you when you read instructions, pamphlets, or other written materials from your doctor or pharmacy?: 1 - Never  Exercise Current Exercise Habits: The patient does not participate in regular exercise at present  Diet Patient reports consuming 2 meals a day and 1 snack(s) a day Patient reports that her primary diet is: Regular, Diabetic Patient reports that she does have regular access to food.   Depression Screen PHQ 2/9 Scores 04/12/2020 11/13/2019 08/29/2019 11/10/2018 10/27/2018 08/08/2018 05/13/2018  PHQ - 2 Score 0 1 2 0 0 0 0  PHQ- 9 Score - - 4 - - - -     Fall Risk Fall Risk  04/12/2020 11/13/2019 11/10/2018 10/27/2018 08/08/2018  Falls in the past year? 0 0 0 0 0  Number falls in past yr: - - 0 - -  Injury with Fall? - - 0 - -  Follow up - Falls evaluation completed - - -      Objective:  Carmen [name] seemed alert and oriented and she participated appropriately during our telephone visit.  Blood Pressure Weight BMI  BP Readings from Last 3 Encounters:  01/02/20 (!) 125/44  12/29/19 130/80  12/27/19 (!) 156/58   Wt Readings from Last 3 Encounters:  12/29/19 188 lb 2 oz (85.3 kg)  12/27/19 188 lb 4 oz (85.4 kg)  11/13/19 190 lb 6 oz (86.4 kg)   BMI Readings from Last 1 Encounters:  12/29/19 32.29 kg/m    *Unable to obtain current vital signs, weight, and BMI due to telephone visit type  Hearing/Vision   [place] did not seem to have difficulty with hearing/understanding during the telephone conversation  Reports that she has had a formal eye exam by an eye care professional within the past year  Reports that she has not had a formal hearing evaluation within the past year *Unable to fully assess hearing and vision during telephone visit type  Cognitive Function: 6CIT Screen 04/12/2020 11/10/2018  What Year? 0 points 0 points  What month? 0 points 0 points  What time? 0 points 0 points  Count back from 20 0 points 0 points  Months in reverse 0 points 0 points  Repeat phrase 0 points 0 points  Total Score 0 0   (Normal:0-7, Significant for Dysfunction: >8)  Normal Cognitive Function Screening: Yes   Immunization & Health Maintenance Record Immunization History  Administered Date(s) Administered   Influenza, High Dose Seasonal PF 12/09/2012, 12/26/2013, 03/18/2018   Influenza,inj,Quad PF,6+ Mos 01/01/2015   Influenza,inj,quad, With Preservative 04/23/2017   Influenza-Unspecified 12/21/2013, 01/21/2016, 03/09/2020   PFIZER(Purple Top)SARS-COV-2 Vaccination 04/16/2019, 05/08/2019, 01/20/2020   Zoster 01/24/2015    Health Maintenance  Topic Date Due   Hepatitis C Screening  Never done   TETANUS/TDAP  Never done   PNA vac Low Risk Adult (1 of 2 - PCV13) Never done   MAMMOGRAM  10/13/2018   OPHTHALMOLOGY EXAM  11/26/2018    FOOT EXAM  01/26/2020   HEMOGLOBIN A1C  05/15/2020   INFLUENZA VACCINE  Completed   DEXA SCAN  Completed   COVID-19 Vaccine  Completed       Assessment  This is a routine wellness examination for [employer].  Health Maintenance: Due or Overdue Health Maintenance Due  Topic Date Due   Hepatitis C Screening  Never done   TETANUS/TDAP  Never done   PNA vac Low Risk Adult (1 of 2 - PCV13) Never done   MAMMOGRAM  10/13/2018   OPHTHALMOLOGY EXAM  11/26/2018   FOOT EXAM  01/26/2020     Carmen [name] does not need a referral for Community Assistance: She is enrolled the CCM with [facility] and [place] Management:   no Social Work:    no Prescription Assistance:  no Nutrition/Diabetes Education:  no   Plan:  Personalized Goals Goals Addressed            This Visit's Progress    AWV       04/12/2020 AWV Goal: Diabetes Management   Patient will maintain an A1C level below 8.0  Patient will not develop any diabetic foot complications  Patient will not experience any hypoglycemic episodes over the next 3 months  Patient will notify our office of any CBG readings outside of the provider recommended range by calling [phone]  Patient will adhere to provider recommendations for diabetes management  Patient Self Management Activities  take all medications as prescribed and report any negative side effects  monitor and record blood sugar readings as directed  adhere to a low carbohydrate diet that incorporates lean proteins, vegetables, whole grains, low glycemic fruits  check feet daily noting any sores, cracks, injuries, or callous formations  see PCP or podiatrist if she notices any changes in her legs, feet, or toenails  Patient will visit PCP and have an A1C level checked every 3 to 6 months as directed   have a yearly eye exam to monitor for vascular changes associated with diabetes and will request that the report be sent  to her pcp.   consult with her PCP regarding any changes in her health or new or worsening symptoms  04/12/2020 AWV Goal: Fall Prevention   Over the next year, patient will decrease their risk for falls by: o Using assistive devices, such as a cane or walker, as needed o Identifying fall risks within their home and correcting them by: - Removing throw rugs - Adding handrails to stairs or ramps - Removing clutter and keeping a clear pathway throughout the home - Increasing light, especially at night - Adding shower handles/bars - Raising toilet seat o Identifying potential personal risk factors for falls: - Medication side effects - Incontinence/urgency - Vestibular dysfunction - Hearing loss - Musculoskeletal disorders - Neurological disorders - Orthostatic hypotension        Personalized Health Maintenance & Screening Recommendations  Pneumococcal vaccine  Td vaccine Screening mammography Foot exam  Lung Cancer Screening Recommended: no (Low Dose CT Chest recommended if Age 6-80 years, 30 pack-year currently smoking OR have quit w/in past 15 years) Hepatitis C Screening recommended: no HIV Screening recommended: no  Advanced Directives: Written information was not prepared per patient's request.  Referrals & Orders No orders of the defined types were placed in this encounter.   Follow-up Plan  Follow-up with Claretta Fraise, MD as planned  Schedule yearly mammogram  Discuss pneumonia vaccine at next visit.  AVS printed and mailed to patient   I have personally reviewed and noted the following in the patients chart:    Medical and social history  Use of alcohol, tobacco or illicit drugs   Current medications and supplements  Functional ability and status  Nutritional status  Physical activity  Advanced directives  List of other physicians  Hospitalizations, surgeries, and ER visits in previous 12 months  Vitals  Screenings to include  cognitive, depression, and falls  Referrals and appointments  In addition, I have reviewed and discussed with Carmen [name] certain preventive protocols, quality metrics, and best practice recommendations. A written personalized care plan for preventive services as well as general preventive health recommendations is available and can be mailed to the patient at her request.      Gareth Morgan  04/12/2020

## 2020-04-12 NOTE — Patient Instructions (Signed)
Carmen [name]  Carmen [name] ,  Thank you for allowing me to perform your Medicare Annual Wellness Visit and for your ongoing commitment to your health.   Health Maintenance & Immunization History Health Maintenance  Topic Date Due  . Hepatitis C Screening  Never done  . TETANUS/TDAP  Never done  . PNA vac Low Risk Adult (1 of 2 - PCV13) Never done  . MAMMOGRAM  10/13/2018  . OPHTHALMOLOGY EXAM  11/26/2018  . FOOT EXAM  01/26/2020  . HEMOGLOBIN A1C  05/15/2020  . INFLUENZA VACCINE  Completed  . DEXA SCAN  Completed  . COVID-19 Vaccine  Completed   Immunization History  Administered Date(s) Administered  . Influenza, High Dose Seasonal PF 12/09/2012, 12/26/2013, 03/18/2018  . Influenza,inj,Quad PF,6+ Mos 01/01/2015  . Influenza,inj,quad, With Preservative 04/23/2017  . Influenza-Unspecified 12/21/2013, 01/21/2016, 03/09/2020  . PFIZER(Purple Top)SARS-COV-2 Vaccination 04/16/2019, 05/08/2019, 01/20/2020  . Zoster 01/24/2015    These are the patient goals that we discussed: Goals Addressed            This Visit's Progress   . AWV       04/12/2020 AWV Goal: Diabetes Management  . Patient will maintain an A1C level below 8.0 . Patient will not develop any diabetic foot complications . Patient will not experience any hypoglycemic episodes over the next 3 months . Patient will notify our office of any CBG readings outside of the provider recommended range by calling [phone] . Patient will adhere to provider recommendations for diabetes management  Patient Self Management Activities . take all medications as prescribed and report any negative side effects . monitor and record blood sugar readings as directed . adhere to a low carbohydrate diet that incorporates lean proteins, vegetables, whole grains, low glycemic fruits . check feet daily noting any sores, cracks, injuries, or callous  formations . see PCP or podiatrist if she notices any changes in her legs, feet, or toenails . Patient will visit PCP and have an A1C level checked every 3 to 6 months as directed  . have a yearly eye exam to monitor for vascular changes associated with diabetes and will request that the report be sent to her pcp.  . consult with her PCP regarding any changes in her health or new or worsening symptoms  04/12/2020 AWV Goal: Fall Prevention  . Over the next year, patient will decrease their risk for falls by: o Using assistive devices, such as a cane or walker, as needed o Identifying fall risks within their home and correcting them by: - Removing throw rugs - Adding handrails to stairs or ramps - Removing clutter and keeping a clear pathway throughout the home - Increasing light, especially at night - Adding shower handles/bars - Raising toilet seat o Identifying potential personal risk factors for falls: - Medication side effects - Incontinence/urgency - Vestibular dysfunction - Hearing loss - Musculoskeletal disorders - Neurological disorders - Orthostatic hypotension          This is a list of Health Maintenance Items that are overdue or due now: Health Maintenance Due  Topic Date Due  . Hepatitis C Screening  Never done  . TETANUS/TDAP  Never done  . PNA vac Low Risk Adult (1 of 2 - PCV13) Never done  . MAMMOGRAM  10/13/2018  . OPHTHALMOLOGY EXAM  11/26/2018  . FOOT EXAM  01/26/2020     Orders/Referrals Placed Today: No orders of the defined types were  placed in this encounter.  (Contact our referral department at [phone] if you have not spoken with someone about your referral appointment within the next 5 days)    Follow-up Plan . Follow-up with Carmen Fraise, MD as planned . Schedule yearly mammogram . Discuss pneumonia vaccine at next visit.

## 2020-04-16 ENCOUNTER — Ambulatory Visit: Payer: Medicare Other | Admitting: Internal Medicine

## 2020-04-17 ENCOUNTER — Other Ambulatory Visit: Payer: Self-pay | Admitting: Internal Medicine

## 2020-05-01 ENCOUNTER — Ambulatory Visit (INDEPENDENT_AMBULATORY_CARE_PROVIDER_SITE_OTHER): Payer: Medicare Other | Admitting: Licensed Clinical Social Worker

## 2020-05-01 ENCOUNTER — Other Ambulatory Visit: Payer: Self-pay | Admitting: Internal Medicine

## 2020-05-01 ENCOUNTER — Other Ambulatory Visit: Payer: Self-pay | Admitting: Family Medicine

## 2020-05-01 DIAGNOSIS — E1159 Type 2 diabetes mellitus with other circulatory complications: Secondary | ICD-10-CM

## 2020-05-01 DIAGNOSIS — M1990 Unspecified osteoarthritis, unspecified site: Secondary | ICD-10-CM | POA: Diagnosis not present

## 2020-05-01 DIAGNOSIS — I152 Hypertension secondary to endocrine disorders: Secondary | ICD-10-CM | POA: Diagnosis not present

## 2020-05-01 DIAGNOSIS — E782 Mixed hyperlipidemia: Secondary | ICD-10-CM

## 2020-05-01 DIAGNOSIS — E559 Vitamin D deficiency, unspecified: Secondary | ICD-10-CM

## 2020-05-01 DIAGNOSIS — Z951 Presence of aortocoronary bypass graft: Secondary | ICD-10-CM

## 2020-05-01 DIAGNOSIS — N183 Chronic kidney disease, stage 3 unspecified: Secondary | ICD-10-CM | POA: Diagnosis not present

## 2020-05-01 NOTE — Patient Instructions (Addendum)
Licensed Clinical Social Worker Visit Information  Goals we discussed today:   .  Client will call LCSW in next 30 days to talk about her management of medical condtions and about her completion of ADLs (pt-stated)        CARE PLAN ENTRY   Current Barriers:   Vertigo issues in client with chronic diagnoses of HTN, HLD, Arthritis, S/P CABG X 2, Viatmin D Deficiency, CKD  Mobility issues  Clinical Social Work Clinical Goal(s):   LCSW to call client in next 30 days to talk with her about management of her medical condition and her completion of daily ADLs  Interventions:  Talked with client about CCM program support  Talked with client about RNCM support with CCM program  Talked with client about pain issues of client  Talked with client about social support network of client (has  support from daughter)  Talked with client about ambulation needs of client (uses a cane to help her walk)  Talked with client about DME of client (has a tub seat, hand held shower, grab bars, low step shower)  Talked with client about vision challenges of client  (wears glasses)  Talked with client about hearing of client (she has new hearing aids)  Talked with client about her upcoming appointment this Friday with endocrinologist  Talked with client about relaxation activities (likes to read and sit on front porch, likes to work with flowers/roses)  Talked with client about her upcoming client medical appointments   Talked with client about her meals provision  Talked with client about her completion of ADLs  Talked with client about medication procurement of client  Talked with client about transport needs of client  Talked with client about skin integrity of client  Talked with client about sleeping issues of client  Talked with client about her appointment in March of 2022 with cardiologist  Encouraged client to call RNCM as needed to discuss nursing needs of  client  Patient Self Care Activities:  Does ADLs independently Attends scheduled medical appointments Drives to appointments and drives to complete errands  Patient Self Care Deficits:   Balance issues  Ambulation issues  Vertigo issues  Initial goal documentation     Follow Up Plan:LCSW to call client in next 4 weeks to talk with client about her management of medical issues and about her completion of ADLs  Materials Provided: No  The patient verbalized understanding of instructions provided today and declined a print copy of patient instruction materials.   Norva Riffle.Keland Peyton MSW, LCSW Licensed Clinical Social Worker [facility] [phone]

## 2020-05-01 NOTE — Chronic Care Management (AMB) (Signed)
Chronic Care Management    Clinical Social Work Follow Up Note  05/01/2020 Name: Carmen [name] MRN: [id number] DOB: [birth year]  Carmen [name] is a 80 y.o. year old female who is a primary care patient of Stacks, Cletus Gash, MD. The CCM team was consulted for assistance with [employer] .   Review of patient status, including review of consultants reports, other relevant assessments, and collaboration with appropriate care team members and the patient's provider was performed as part of comprehensive patient evaluation and provision of chronic care management services.    SDOH (Social Determinants of Health) assessments performed: No; risk for depression; risk for tobacco use; risk for stress; risk for physical inactivity  Flowsheet Row Chronic Care Management from 08/29/2019 in [place]  PHQ-9 Total Score 4      GAD 7 : Generalized Anxiety Score 08/29/2019  Nervous, Anxious, on Edge 0  Control/stop worrying 1  Worry too much - different things 0  Trouble relaxing 1  Restless 0  Easily annoyed or irritable 0  Afraid - awful might happen 0  Total GAD 7 Score 2  Anxiety Difficulty Somewhat difficult    Outpatient Encounter Medications as of 05/01/2020  Medication Sig  . PRALUENT 150 MG/ML SOAJ Inject 1 Dose into the skin every 14 (fourteen) days.  Marland Kitchen allopurinol (ZYLOPRIM) 100 MG tablet TAKE  (1)  TABLET TWICE A DAY.  Marland Kitchen aspirin 81 MG tablet Take 81 mg by mouth daily.  . colchicine 0.6 MG tablet TAKE (1) TABLET DAILY AS NEEDED.  . fluticasone (FLONASE) 50 MCG/ACT nasal spray SPRAY 1 SPRAY IN EACH NOSTRIL ONCE DAILY.  . furosemide (LASIX) 20 MG tablet TAKE 1 TABLET DAILY  . glipiZIDE (GLUCOTROL) 10 MG tablet TAKE 2 TABLETS BEFORE BREAKFAST AND 1 TABLET DAILY BEFORE SUPPER  . glucose blood (ONE TOUCH ULTRA TEST) test strip CHECK BLOOD SUGAR 2 TIMES A DAY  . icosapent Ethyl (VASCEPA) 1 g capsule Take 2 capsules (2 g total) by mouth 2 (two) times daily.   . Lancets Misc. (ACCU-CHEK FASTCLIX LANCET) KIT Use to check Blood Sugars  . meclizine (ANTIVERT) 12.5 MG tablet Take 1 tablet (12.5 mg total) by mouth 3 (three) times daily as needed for dizziness.  . metoprolol tartrate (LOPRESSOR) 25 MG tablet TAKE  (1)  TABLET TWICE A DAY.  Marland Kitchen olmesartan (BENICAR) 20 MG tablet TAKE 1 TABLET DAILY  . pregabalin (LYRICA) 50 MG capsule TAKE (1) CAPSULE TWICE DAILY.  Marland Kitchen RESTASIS 0.05 % ophthalmic emulsion   . RYBELSUS 7 MG TABS TAKE 1 TABLET DAILY BEFORE BREAKFAST  . Vitamin D, Ergocalciferol, (DRISDOL) 1.25 MG (50000 UT) CAPS capsule Take 1 capsule (50,000 Units total) by mouth 2 (two) times a week.  . VOLTAREN 1 % GEL APPLY 4 GRAMS TO AFFECTED AREA(S) 4 TIMES A DAY AS NEEDED   No facility-administered encounter medications on file as of 05/01/2020.    Goals    .  Client will call LCSW in next 30 days to talk about her management of medical condtions and about her completion of ADLs (pt-stated)      CARE PLAN ENTRY   Current Barriers:  . Vertigo issues in client with chronic diagnoses of HTN, HLD, Arthritis, S/P CABG X 2, Viatmin D Deficiency, CKD . Mobility issues  Clinical Social Work Clinical Goal(s):  Marland Kitchen LCSW to call client in next 30 days to talk with her about management of her medical condition and her completion of daily ADLs  Interventions: .  Talked with client about CCM program support . Talked with client about RNCM support with CCM program . Talked with client about pain issues of client . Talked with client about social support network of client (has  support from daughter) . Talked with client about ambulation needs of client (uses a cane to help her walk) . Talked with client about DME of client (has a tub seat, hand held shower, grab bars, low step shower) . Talked with client about vision challenges of client  (wears glasses) . Talked with client about hearing of client (she has new hearing aids) . Talked with client about her upcoming  appointment this Friday with endocrinologist . Talked with client about relaxation activities (likes to read and sit on front porch, likes to work with flowers/roses) . Talked with client about her upcoming client medical appointments  . Talked with client about her meals provision . Talked with client about her completion of ADLs . Talked with client about medication procurement of client . Talked with client about transport needs of client . Talked with client about skin integrity of client . Talked with client about sleeping issues of client . Talked with client about her appointment in March of 2022 with cardiologist . Encouraged client to call RNCM as needed to discuss nursing needs of client  Patient Self Care Activities:  Does ADLs independently Attends scheduled medical appointments Drives to appointments and drives to complete errands  Patient Self Care Deficits:  . Balance issues . Ambulation issues . Vertigo issues  Initial goal documentation     Follow Up Plan:LCSW to call client in next 4 weeks to talk with client about her management of medical issues and about her completion of ADLs  Norva Riffle.Taelyr Jantz MSW, LCSW Licensed Clinical Social Worker [facility] Care Management [phone]

## 2020-05-03 ENCOUNTER — Other Ambulatory Visit: Payer: Self-pay

## 2020-05-03 ENCOUNTER — Encounter: Payer: Self-pay | Admitting: Internal Medicine

## 2020-05-03 ENCOUNTER — Ambulatory Visit (INDEPENDENT_AMBULATORY_CARE_PROVIDER_SITE_OTHER): Payer: Medicare Other | Admitting: Internal Medicine

## 2020-05-03 VITALS — BP 134/84 | HR 70 | Ht 64.0 in | Wt 182.1 lb

## 2020-05-03 DIAGNOSIS — E1122 Type 2 diabetes mellitus with diabetic chronic kidney disease: Secondary | ICD-10-CM

## 2020-05-03 DIAGNOSIS — E1142 Type 2 diabetes mellitus with diabetic polyneuropathy: Secondary | ICD-10-CM | POA: Diagnosis not present

## 2020-05-03 DIAGNOSIS — N1831 Chronic kidney disease, stage 3a: Secondary | ICD-10-CM | POA: Diagnosis not present

## 2020-05-03 LAB — POCT GLYCOSYLATED HEMOGLOBIN (HGB A1C): Hemoglobin A1C: 6.4 % — AB (ref 4.0–5.6)

## 2020-05-03 MED ORDER — RYBELSUS 7 MG PO TABS: 1.0000 | ORAL_TABLET | Freq: Every day | ORAL | 3 refills | Status: DC

## 2020-05-03 MED ORDER — GLIPIZIDE 10 MG PO TABS: ORAL_TABLET | ORAL | 3 refills | Status: DC

## 2020-05-03 NOTE — Progress Notes (Signed)
Name: Carmen [name]  Age/ Sex: 80 y.o., female   MRN/ DOB: [id number], 11-13-1940     PCP: Claretta Fraise, MD   Reason for Endocrinology Evaluation: Type 2 Diabetes Mellitus  Initial Endocrine Consultative Visit: 09/14/2018    PATIENT IDENTIFIER: Carmen [name] is a 80 y.o. female with a past medical history of HTN, PVC, neuromuscular disorder, hemochromatosis and CAD (S/P CABG) . The patient has followed with Endocrinology clinic since 09/14/2018 for consultative assistance with management of her diabetes.  DIABETIC HISTORY:  Carmen [name] was diagnosed with T2DM in 2016. Has tried oral glycemic agents in the past,Januvia- swelling , Trulicity - nausea , metformin - elevated LFT's . Her hemoglobin A1c has ranged from 6.6% in 2016, peaking at 8.6% in 2020.  Wilder Glade stopped 10/2018 due to vertigo   Rybelsus started 2/21  SUBJECTIVE:   During the last visit (12/29/2019): A1c was 6.8%. We continued Glipizide and  Rybelsus     Today (05/03/2020): Carmen [name] is here for a follow up on diabetes management.  She checks her blood sugars 2 times daily, preprandial to breakfast. The patient has not had hypoglycemic episodes since the last clinic visit.   Denies nausea or diarrhea  But having constipaiton    HOME DIABETES REGIMEN:   Glipizide 10 mg 2 tabs before breakfast and 1 tablet before supper   Rybelsus 7 mg daily     METER DOWNLOAD SUMMARY: 1/28-2/01/2021 Average Number Tests/Day = 1.2 Overall Mean FS Glucose = 119 Standard Deviation = 16  BG Ranges: Low = 93 High = 150   Hypoglycemic Events/30 Days: BG < 50 = 0 Episodes of symptomatic severe hypoglycemia = 0   DIABETIC COMPLICATIONS: Microvascular complications:   CKD III, neuropathy   Denies: retinopathy   Last eye exam: Completed 11/2018  Macrovascular complications:   CAD (S/P CABG)  Denies: PVD, CVA     HISTORY:  Past Medical History:  Past Medical History:  Diagnosis Date  .  Arthritis   . Asymptomatic PVCs   . Bilateral shoulder pain   . CAD (coronary artery disease)   . Cancer ([place]) 11/2015   melanomax4  right upper arm  . Chronic renal insufficiency, stage 3 (moderate) (HCC)   . Diabetes mellitus without complication ([place])   . Diverticulosis   . Endometrial polyp   . External hemorrhoids   . Gout   . Hemochromatosis, hereditary ([place]) 11/24/2012  . History of anemia   . History of duodenal ulcer 1990  . Hyperlipidemia   . Hypertension   . Internal hemorrhoids   . Jaundice    age 57 or 7  . Myocardial infarction ([place]) 2007  . Neuromuscular disorder ([place])    peripheral neuropathy  . PMB (postmenopausal bleeding)   . PONV (postoperative nausea and vomiting)   . Prolapse of female pelvic organs    uses pessary  . Seizures ([place])    had one at Dr. Antonieta Pert office after getting blood drawn  . Tick bite 08/12/2017   had 3 bites  . Vitamin D deficiency    Past Surgical History:  Past Surgical History:  Procedure Laterality Date  . BREAST SURGERY     left breast lump--benign  . CARDIAC CATHETERIZATION  06/03/2005  . COLONOSCOPY  11/28/2001  . CORONARY ARTERY BYPASS GRAFT  2007   x2 Dr. Roxan Hockey, [place] to LAD, SVG to PDA  . DILATATION & CURETTAGE/HYSTEROSCOPY WITH MYOSURE N/A 09/28/2017   Procedure: DILATATION & CURETTAGE/HYSTEROSCOPY WITH MYOSURE;  Surgeon:  Molli Posey, MD;  Location: [facility];  Service: Gynecology;  Laterality: N/A;  . LIPOMA EXCISION     back  . UPPER GI ENDOSCOPY  01/20/1989    Social History:  reports that she has never smoked. She has never used smokeless tobacco. She reports current alcohol use. She reports that she does not use drugs. Family History:  Family History  Problem Relation Age of Onset  . Stroke Mother   . Hypertension Mother   . Neuropathy Mother   . Stroke Father   . Hypertension Father   . Diabetes Father   . Cancer Sister 5       sarcoma  . Diabetes Brother   . Arthritis  Sister   . Arthritis Sister   . Lupus Sister   . Hemachromatosis Sister   . Arthritis Sister   . Hemachromatosis Sister   . Hemachromatosis Sister   . Diverticulitis Sister   . Diabetes Brother   . Diabetes Brother   . Hemachromatosis Brother   . Lung cancer Brother      HOME MEDICATIONS: Allergies as of 05/03/2020      Reactions   Cymbalta [duloxetine Hcl] Other (See Comments)   Profuse sweating   Januvia [sitagliptin] Swelling   Meloxicam Nausea And Vomiting   Trulicity [dulaglutide] Nausea Only   Diclofenac Diarrhea   Farxiga [dapagliflozin] Other (See Comments)   Dizziness   Shellfish Allergy Other (See Comments)   Gout   Tape    Causes skin irritation   Crestor [rosuvastatin] Other (See Comments)   Muscle aches on 5 mg daily and 10 mg twice weekly   Lipitor [atorvastatin] Other (See Comments)   Muscle aches   Livalo [pitavastatin] Other (See Comments)      Sulfa Antibiotics Rash   Zetia [ezetimibe] Other (See Comments)   Muscle aches   Zocor [simvastatin] Other (See Comments)   Muscle aches       Medication List       Accurate as of May 03, 2020  3:28 PM. If you have any questions, ask your nurse or doctor.        Accu-Chek [employer] Kit Use to check Blood Sugars   allopurinol 100 MG tablet Commonly known as: ZYLOPRIM TAKE  (1)  TABLET TWICE A DAY.   aspirin 81 MG tablet Take 81 mg by mouth daily.   colchicine 0.6 MG tablet TAKE (1) TABLET DAILY AS NEEDED.   fluticasone 50 MCG/ACT nasal spray Commonly known as: FLONASE SPRAY 1 SPRAY IN EACH NOSTRIL ONCE DAILY.   furosemide 20 MG tablet Commonly known as: LASIX Take 1 tablet (20 mg total) by mouth daily. (Needs to be seen before next refill)   glipiZIDE 10 MG tablet Commonly known as: GLUCOTROL TAKE 2 TABLETS BEFORE BREAKFAST AND 1 TABLET DAILY BEFORE SUPPER   glucose blood test strip Commonly known as: ONE TOUCH ULTRA TEST CHECK BLOOD SUGAR 2 TIMES A DAY   icosapent Ethyl 1  g capsule Commonly known as: Vascepa Take 2 capsules (2 g total) by mouth 2 (two) times daily.   meclizine 12.5 MG tablet Commonly known as: ANTIVERT Take 1 tablet (12.5 mg total) by mouth 3 (three) times daily as needed for dizziness.   metoprolol tartrate 25 MG tablet Commonly known as: LOPRESSOR TAKE  (1)  TABLET TWICE A DAY.   olmesartan 20 MG tablet Commonly known as: BENICAR TAKE 1 TABLET DAILY   Praluent 150 MG/ML Soaj Generic drug: Alirocumab Inject 1 Dose  into the skin every 14 (fourteen) days.   pregabalin 50 MG capsule Commonly known as: LYRICA TAKE (1) CAPSULE TWICE DAILY.   Restasis 0.05 % ophthalmic emulsion Generic drug: cycloSPORINE   Rybelsus 7 MG Tabs Generic drug: Semaglutide TAKE 1 TABLET DAILY BEFORE BREAKFAST   Vitamin D (Ergocalciferol) 1.25 MG (50000 UNIT) Caps capsule Commonly known as: DRISDOL Take 1 capsule (50,000 Units total) by mouth 2 (two) times a week.   Voltaren 1 % Gel Generic drug: diclofenac Sodium APPLY 4 GRAMS TO AFFECTED AREA(S) 4 TIMES A DAY AS NEEDED        OBJECTIVE:   Vital Signs: BP 134/84   Pulse 70   Ht _0  (1.626 m)   Wt 182 lb 2 oz (82.6 kg)   SpO2 97%   BMI 31.26 kg/m   Wt Readings from Last 3 Encounters:  05/03/20 182 lb 2 oz (82.6 kg)  12/29/19 188 lb 2 oz (85.3 kg)  12/27/19 188 lb 4 oz (85.4 kg)     Exam: General: Pt appears well and is in NAD  Lungs: Clear with good BS bilat with no rales, rhonchi, or wheezes  Heart: RRR with normal S1 and S2 and no gallops; no murmurs; no rub  Extremities: No pretibial edema.  Skin: Normal texture and temperature to palpation.   Neuro: MS is good with appropriate affect, pt is alert and Ox3     DM foot exam: 12/29/2019   The skin of the feet is intact without sores or ulcerations. The pedal pulses are 2+ on right and 2+ on left. The sensation is decreased to a screening 5.07, 10 gram monofilament bilaterally    DATA REVIEWED:  Lab Results   Component Value Date   HGBA1C 6.4 (A) 05/03/2020   HGBA1C 6.8 11/13/2019   HGBA1C 7.3 (A) 08/23/2019   Lab Results  Component Value Date   MICROALBUR <0.7 01/26/2019   LDLCALC 56 11/13/2019   CREATININE 1.27 (H) 12/27/2019    Lab Results  Component Value Date   CHOL 145 11/13/2019   HDL 38 (L) 11/13/2019   LDLCALC 56 11/13/2019   LDLDIRECT 75 12/02/2018   TRIG 333 (H) 11/13/2019   CHOLHDL 3.8 11/13/2019         ASSESSMENT / PLAN / RECOMMENDATIONS:   1) Type 2 Diabetes Mellitus, Optimally  controlled, With neuropathic and CKD III complications - Most recent A1c of 6.4 %. Goal A1c < 7.5 %.     - A1c continues to improve, I have congratulated her on optimal glucose control  - No changes today   MEDICATIONS: - Continue Glipizide 10 mg , 2 tabs with Breakfast and 1 tablet with Supper  - Continue  Rybelsus 7 mg daily with breakfast    EDUCATION / INSTRUCTIONS:  BG monitoring instructions: Patient is instructed to check her blood sugars 3 times a week fasting .  Call [place] [facility] if: BG persistently < 70  . I reviewed the Rule of 15 for the treatment of hypoglycemia in detail with the patient. Literature supplied.     F/U in 4 months    Signed electronically by: Mack Guise, MD  [facility]  [facility] [address] [place], [place] [address] Phone: [phone] FAX: [phone]   CC: [address], [place] [address] Phone: [phone]  Fax: [phone]  Return to Endocrinology clinic as below: Future Appointments  Date Time Provider [place]  06/03/2020  9:45  AM [facility] SOCIAL WORK [facility] None  06/10/2020  1:45 PM Pixie Casino, MD [facility]  06/26/2020  1:15 PM [facility] LAB [facility] None  06/26/2020  1:45 PM En[place], Rudell Cobb, MD [facility] None  04/14/2021  1:15 PM [facility]-ANNUAL WELLNESS VISIT [facility] None

## 2020-05-03 NOTE — Patient Instructions (Addendum)
-   Keep Up the Good Work ! - Continue Glipizide 10 mg, 2 tablets before breakfast and 1 tablet before supper   - Continue  Rybelsus 7 mg daily with Breakfast       - HOW TO TREAT LOW BLOOD SUGARS (Blood sugar LESS THAN 70 MG/DL)  Please follow the RULE OF 15 for the treatment of hypoglycemia treatment (when your (blood sugars are less than 70 mg/dL)    STEP 1: Take 15 grams of carbohydrates when your blood sugar is low, which includes:   3-4 GLUCOSE TABS  OR  3-4 OZ OF JUICE OR REGULAR SODA OR  ONE TUBE OF GLUCOSE GEL     STEP 2: RECHECK blood sugar in 15 MINUTES STEP 3: If your blood sugar is still low at the 15 minute recheck --> then, go back to STEP 1 and treat AGAIN with another 15 grams of carbohydrates.

## 2020-05-17 ENCOUNTER — Other Ambulatory Visit: Payer: Self-pay | Admitting: Family Medicine

## 2020-05-30 ENCOUNTER — Other Ambulatory Visit: Payer: Self-pay | Admitting: Family Medicine

## 2020-05-30 DIAGNOSIS — Z951 Presence of aortocoronary bypass graft: Secondary | ICD-10-CM

## 2020-05-30 DIAGNOSIS — E1159 Type 2 diabetes mellitus with other circulatory complications: Secondary | ICD-10-CM

## 2020-05-30 DIAGNOSIS — I152 Hypertension secondary to endocrine disorders: Secondary | ICD-10-CM

## 2020-05-30 NOTE — Telephone Encounter (Signed)
Stacks. NTBS 30 days given 05/02/20

## 2020-05-31 NOTE — Telephone Encounter (Signed)
APPOINTMENT SCHEDULED, STATES SHE JUST GOT A REFILL SO SHE SHOULD HAVE ENOUGH UNTIL APPOINTMENT.

## 2020-06-03 ENCOUNTER — Ambulatory Visit: Payer: Medicare Other | Admitting: Licensed Clinical Social Worker

## 2020-06-03 DIAGNOSIS — I152 Hypertension secondary to endocrine disorders: Secondary | ICD-10-CM

## 2020-06-03 DIAGNOSIS — E1159 Type 2 diabetes mellitus with other circulatory complications: Secondary | ICD-10-CM

## 2020-06-03 DIAGNOSIS — E559 Vitamin D deficiency, unspecified: Secondary | ICD-10-CM

## 2020-06-03 DIAGNOSIS — E1165 Type 2 diabetes mellitus with hyperglycemia: Secondary | ICD-10-CM

## 2020-06-03 DIAGNOSIS — E782 Mixed hyperlipidemia: Secondary | ICD-10-CM

## 2020-06-03 DIAGNOSIS — Z951 Presence of aortocoronary bypass graft: Secondary | ICD-10-CM

## 2020-06-03 DIAGNOSIS — N183 Chronic kidney disease, stage 3 unspecified: Secondary | ICD-10-CM

## 2020-06-03 DIAGNOSIS — M1990 Unspecified osteoarthritis, unspecified site: Secondary | ICD-10-CM

## 2020-06-03 NOTE — Patient Instructions (Signed)
Visit Information  PATIENT GOALS: Goals Addressed              This Visit's Progress   .  Find Help in My Community (pt-stated)        Timeframe:  Short-Term Goal Priority:  Medium This Visit's Progress:  On Track Start Date:     06/03/2020                        Expected End Date:     09/03/2020                  Follow Up Date 07/04/2020   Find Help in My Community (Patient);  Patient needs information on community resources to help her manage medical conditions.   Why is this important?    Knowing how and where to find help for yourself or family in your neighborhood and community is an important skill.   You will want to take some steps to learn how.  o  Patient Goals:  Client will use coping strategies to help her manage ongoing medical issues faced Client will complete ADLs daily as needed Client will communicate as able with community resources to learn how these resources may help her manage her medical conditions.   Follow UP Date:  LCSW will call client on 07/04/2020 to assess client needs       Follow Up Plan:  LCSW will call client on 07/04/2020 to assess client needs  Norva Riffle.Abdulraheem Pineo MSW, LCSW Licensed Clinical Social Worker [facility] Care Management [phone]

## 2020-06-03 NOTE — Chronic Care Management (AMB) (Signed)
Chronic Care Management    Clinical Social Work Note  06/03/2020 Name: Carmen [name] MRN: [id number] DOB: [birth year]  CATHRYNE MANCEBO is a 80 y.o. year old female who is a primary care patient of Stacks, Cletus Gash, MD. The CCM team was consulted to assist the patient with chronic disease management and/or care coordination needs related to: [employer] .   Engaged with patient by telephone for follow up visit in response to provider referral for social work chronic care management and care coordination services.   Consent to Services:  The patient was given information about Chronic Care Management services, agreed to services, and gave verbal consent prior to initiation of services.  Please see initial visit note for detailed documentation.   Patient agreed to services and consent obtained.   Assessment: Review of patient past medical history, allergies, medications, and health status, including review of relevant consultants reports was performed today as part of a comprehensive evaluation and provision of chronic care management and care coordination services.     SDOH (Social Determinants of Health) assessments and interventions performed:    Advanced Directives Status: See Vyn[employer] application for related entries.  CCM Care Plan  Allergies  Allergen Reactions  . Cymbalta [Duloxetine Hcl] Other (See Comments)    Profuse sweating  . Januvia [Sitagliptin] Swelling  . Meloxicam Nausea And Vomiting  . Trulicity [Dulaglutide] Nausea Only  . Diclofenac Diarrhea  . Wilder Glade [Dapagliflozin] Other (See Comments)    Dizziness  . Shellfish Allergy Other (See Comments)    Gout  . Tape     Causes skin irritation  . Crestor [Rosuvastatin] Other (See Comments)    Muscle aches on 5 mg daily and 10 mg twice weekly  . Lipitor [Atorvastatin] Other (See Comments)    Muscle aches  . Livalo [Pitavastatin] Other (See Comments)       . Sulfa Antibiotics Rash  . Zetia [Ezetimibe]  Other (See Comments)    Muscle aches  . Zocor [Simvastatin] Other (See Comments)    Muscle aches     Outpatient Encounter Medications as of 06/03/2020  Medication Sig  . allopurinol (ZYLOPRIM) 100 MG tablet Take 1 tablet (100 mg total) by mouth 2 (two) times daily. (NEEDS TO BE SEEN BEFORE NEXT REFILL)  . aspirin 81 MG tablet Take 81 mg by mouth daily.  . colchicine 0.6 MG tablet TAKE (1) TABLET DAILY AS NEEDED.  . fluticasone (FLONASE) 50 MCG/ACT nasal spray SPRAY 1 SPRAY IN EACH NOSTRIL ONCE DAILY.  . furosemide (LASIX) 20 MG tablet Take 1 tablet (20 mg total) by mouth daily. (Needs to be seen before next refill)  . glipiZIDE (GLUCOTROL) 10 MG tablet Take 2 tablets (20 mg total) by mouth daily before breakfast AND 1 tablet (10 mg total) daily with supper.  Marland Kitchen glucose blood (ONE TOUCH ULTRA TEST) test strip CHECK BLOOD SUGAR 2 TIMES A DAY  . icosapent Ethyl (VASCEPA) 1 g capsule Take 2 capsules (2 g total) by mouth 2 (two) times daily.  . Lancets Misc. (ACCU-CHEK FASTCLIX LANCET) KIT Use to check Blood Sugars  . meclizine (ANTIVERT) 12.5 MG tablet Take 1 tablet (12.5 mg total) by mouth 3 (three) times daily as needed for dizziness.  . metoprolol tartrate (LOPRESSOR) 25 MG tablet Take 1 tablet (25 mg total) by mouth 2 (two) times daily. (NEEDS TO BE SEEN BEFORE NEXT REFILL)  . olmesartan (BENICAR) 20 MG tablet Take 1 tablet (20 mg total) by mouth daily. (NEEDS TO BE SEEN BEFORE NEXT  REFILL)  . PRALUENT 150 MG/ML SOAJ Inject 1 Dose into the skin every 14 (fourteen) days.  . pregabalin (LYRICA) 50 MG capsule TAKE (1) CAPSULE TWICE DAILY.  Marland Kitchen RESTASIS 0.05 % ophthalmic emulsion   . Semaglutide (RYBELSUS) 7 MG TABS Take 1 tablet by mouth daily before breakfast.  . Vitamin D, Ergocalciferol, (DRISDOL) 1.25 MG (50000 UT) CAPS capsule Take 1 capsule (50,000 Units total) by mouth 2 (two) times a week.  . VOLTAREN 1 % GEL APPLY 4 GRAMS TO AFFECTED AREA(S) 4 TIMES A DAY AS NEEDED   No  facility-administered encounter medications on file as of 06/03/2020.    Patient Active Problem List   Diagnosis Date Noted  . Type 2 diabetes mellitus with diabetic polyneuropathy, without long-term current use of insulin (HCC) 12/29/2019  . Type 2 diabetes mellitus with stage 3a chronic kidney disease, without long-term current use of insulin (HCC) 12/29/2019  . Type 2 diabetes mellitus with hyperglycemia, without long-term current use of insulin (HCC) 01/27/2019  . Type 2 diabetes mellitus with stage 3b chronic kidney disease, without long-term current use of insulin (HCC) 01/27/2019  . Chronic renal insufficiency, stage 3 (moderate) (HCC) 09/10/2017  . Chronic pain of both shoulders 04/27/2017  . Vitamin D deficiency 01/28/2016  . Non-insulin treated type 2 diabetes mellitus (HCC) 11/12/2015  . Statin intolerance 08/18/2013  . Neuromuscular disorder (HCC)   . Hemochromatosis, hereditary (HCC) 11/24/2012  . S/P CABG x 2 08/09/2012  . PVC's (premature ventricular contractions) 08/09/2012  . Gout 06/20/2012  . Hypertension associated with diabetes (HCC) 06/20/2012  . HLD (hyperlipidemia) 06/20/2012  . Arthritis 06/20/2012    Conditions to be addressed/monitored: ; Needs information regarding community resources to help her manage her current medical conditions  Care Plan : General Social Work (Adult)  Updates made by Isaiah Blakes, LCSW since 06/03/2020 12:00 AM    Problem: Coping Skills (General Plan of Care)     Goal: Manage Medical needs of client and complete ADLs as needed on daily basisi   Start Date: 06/03/2020  Expected End Date: 09/03/2020  This Visit's Progress: On track  Priority: Medium  Note:   Current barriers:   . Patient in need of assistance with connecting to community resources for help as needed in managing her ongoing medical issues faced . Client is challenged with completing ADLs  daily . Acknowledges deficits with meeting unmet needs . Patient is  unable to independently navigate community resource options without care coordination support  Clinical Goals:  patient will work with SW  in next 4 weeks to address concerns related to her management of ongoing health needs of client and management of daily ADLs Patient will talk with LCSW in next 30 days about social isolation issues of client  Clinical Interventions:  . Collaboration with Mechele Claude, MD regarding development and update of comprehensive plan of care as evidenced by provider attestation and co-signature . Assessment of needs, barriers ,self care challenges of client   . Jayme Cloud, daughter of client today but did not speak with [place] via phone today . Talked with client briefly about her current needs. .   Patient Goals:  Client will use coping strategies to help her manage ongoing medical issues faced Client will complete ADLs daily as needed Client will communicate as able with community resources to learn how these resources may help her manage her medical needs at present -  Follow Up Plan: LCSW to call client on 07/04/2020 to assess client needs  Norva Riffle.Peggy Monk MSW, LCSW Licensed Clinical Social Worker [facility] Care Management [phone]

## 2020-06-10 ENCOUNTER — Ambulatory Visit (INDEPENDENT_AMBULATORY_CARE_PROVIDER_SITE_OTHER): Payer: Medicare Other | Admitting: Internal Medicine

## 2020-06-10 ENCOUNTER — Encounter: Payer: Self-pay | Admitting: Internal Medicine

## 2020-06-10 ENCOUNTER — Other Ambulatory Visit: Payer: Self-pay

## 2020-06-10 VITALS — BP 144/76 | HR 61 | Ht 63.0 in | Wt 186.0 lb

## 2020-06-10 DIAGNOSIS — Z789 Other specified health status: Secondary | ICD-10-CM

## 2020-06-10 DIAGNOSIS — I152 Hypertension secondary to endocrine disorders: Secondary | ICD-10-CM | POA: Diagnosis not present

## 2020-06-10 DIAGNOSIS — Z951 Presence of aortocoronary bypass graft: Secondary | ICD-10-CM | POA: Diagnosis not present

## 2020-06-10 DIAGNOSIS — E1159 Type 2 diabetes mellitus with other circulatory complications: Secondary | ICD-10-CM | POA: Diagnosis not present

## 2020-06-10 DIAGNOSIS — E782 Mixed hyperlipidemia: Secondary | ICD-10-CM

## 2020-06-10 NOTE — Patient Instructions (Signed)
Medication Instructions:  Your physician recommends that you continue on your current medications as directed. Please refer to the Current Medication list given to you today.  *If you need a refill on your cardiac medications before your next appointment, please call your pharmacy*   Lab Work: FASTING lipid panel - can be with with PCP or at our [facility]  If you have labs (blood work) drawn today and your tests are completely normal, you will receive your results only by: Marland Kitchen MyChart Message (if you have MyChart) OR . A paper copy in the mail If you have any lab test that is abnormal or we need to change your treatment, we will call you to review the results.   Testing/Procedures: NONE   Follow-Up: At [facility], you and your health needs are our priority.  As part of our continuing mission to provide you with exceptional heart care, we have created designated Provider Care Teams.  These Care Teams include your primary Cardiologist (physician) and Advanced Practice Providers (APPs -  Physician Assistants and Nurse Practitioners) who all work together to provide you with the care you need, when you need it.  We recommend signing up for the patient portal called "MyChart".  Sign up information is provided on this After Visit Summary.  MyChart is used to connect with patients for Virtual Visits (Telemedicine).  Patients are able to view lab/test results, encounter notes, upcoming appointments, etc.  Non-urgent messages can be sent to your provider as well.   To learn more about what you can do with MyChart, go to [email or web address].    Your next appointment:   12 month(s)  The format for your next appointment:   In Person  Provider:   You may see Dr. Debara Pickett or one of the following Advanced Practice Providers on your designated Care Team:    Almyra Deforest, PA-C  Fabian Sharp, [place] or   Roby Lofts, [place]    Other Instructions

## 2020-06-10 NOTE — Progress Notes (Signed)
OFFICE NOTE  Chief Complaint:  Follow-up  Primary Care Physician: Claretta Fraise, MD  HPI:  Carmen [name] is a 80 year-old female comes in for followup of coronary disease. She had bypass surgery in 2007 and at that time had a 40% ejection fraction. She had a nuclear study in May of 2012 that was a low-risk study, but we could not calculate an ejection fraction because of ectopy. She has had no episodes of angina, no unusual shortness of breath unless she walks up a hill or a flight of steps, and then she becomes mildly breathless. She does not have PND or orthopnea. She does not have a productive cough. She has had no awareness of any arrhythmias, no dizziness, no syncope. She does have mild left ankle edema that is dependent in nature. She wears support hose infrequently for this. She has recurrent problems with gout, usually in her great toe. She has made some significant dietary changes, and the gout episodes seem to be less.  She has been diabetic for around 2 years. She has a hemoglobin A1c of 5.8, has had no significant episodes of hypoglycemia. She has noted some proximal lower extremity weakness. She gave an example working in her garden, when she tried to get up her legs were so weak that she had to get up on all fours, and one time her husband had to get her up. She has gone through physical therapy recently because of right hip bursitis, and it may very well be that she needs additional physical therapy for proximal muscle weakness and core strength building. No significant problems from the pollen. She has had some problems with constipation. Otherwise, no new complaints today.  Of note she's had a markedly abnormal lipid profile with particle numbers greater than 3000. She has been intolerant to all statins and has failed Crestor, Zetia, Zocor, Lipitor and Livalo in the past. I feel that she remains at very high risk of cardiac events and there is a great likelihood that she  may develop premature graft failure with her high cholesterol numbers. We did discuss the new PCSK9 inhibitors, for which she may be a great candidate if she is willing to give herself an injection every 2-4 weeks..  I saw Carmen [name] back in the office today. She is doing really well and denies any chest pain or worsening shortness of breath. She was referred for a trial of a study drug for PCS K9 inhibition. Unfortunately this was never arranged however subsequently to [facility] SK 9 inhibitors came out to the market. She was subsequently started on Praluent by her primary care provider and is been using that for several months. Hopefully this will get her cholesterol down to goal.  11/12/2015  Carmen [name] returns today for follow-up. Over the past year she's done fairly well. She occasionally gets some swelling in her legs. She denies any chest pain or shortness of breath. We had started her on Praluent at her last office visit due to statin intolerance. I again discussed the importance of using at least low-dose statin with the Praluent foremost benefit, however she has had a significant reduction in her cholesterol with LDL that was greater than 180 now down to 82. Although she has not had any coronary event since her bypass, it is now been 10 years since her bypass surgery. She last had a stress test in 2012 which was negative for ischemia.  10/29/2016  Carmen [name] was seen today in follow-up. Unfortunately  she lost her husband at the beginning of this year to an acute hemorrhagic stroke. This is her second husband but they've been married for 10 years and she said that he was the love of her life. Overall she is doing well from a coronary standpoint. She denies any chest pain or worsening shortness of breath. Show stress test in November of last year which was negative for ischemia. She's had marked improvement in her cholesterol on Praluent, but her triglycerides remain elevated. Most of this is likely  related to elevated blood sugars but that is also being worked on. She is currently on Januvia.  11/09/2017  Carmen [name] returns for follow-up.  Recently she saw [facility] care wife for preoperative risk assessment for GYN surgery.  She had a uterine fibroid which was removed uneventfully.  She has marked dyslipidemia on Praluent and has been intolerant to statins.  Recent lipid profile showed total cholesterol 188, triglycerides 358 (on Lovaza), HDL 42 and LDL 74.  Overall reasonable lipid profile except for elevated triglycerides which percent residual risk.  We discussed the possibility of switching her over to [facility] when it obtains FDA approval in October.  Blood pressure is well controlled today.  She denies any chest pain or worsening shortness of breath.  Unfortunately her blood sugars been very poorly controlled, probably contributing to her elevated triglycerides.  04/27/2018  Carmen [name] is seen today in follow-up.  She has successfully been using Praluent with some improvement in her lipid profile however most recently her total cholesterol is 187, triglycerides 466, HDL 37 and a direct LDL was not measured.  Presumably her LDL has come down further.  She continues to have persistently elevated triglycerides however which as previously mentioned may lead to persistently elevated cardiovascular risk.  Her only other complaint today is leg swelling.  She noted that she has had some persistently elevated edema despite being on triamterene HCTZ.  She is wondering if there is something stronger for her.  06/10/2020  Carmen [name] is seen today in follow-up.  She continues to do well with Praluent and Vascepa.  She has not had her lipids done since last summer however her LDL was low at 56.  Triglycerides remained elevated.  Recently she has been established with endocrine.  Her A1c is come down from 7.3-6.4.  She denies chest pain or shortness of breath.  She pointed out small mass about the size of a  walnut over the left lower rib to me today.  This did feel somewhat firm and most likely is a lipoma.  She says she has a history of that.  I have encouraged her to follow-up with her primary care provider regarding that.  PMHx:  Past Medical History:  Diagnosis Date  . Arthritis   . Asymptomatic PVCs   . Bilateral shoulder pain   . CAD (coronary artery disease)   . Cancer ([place]) 11/2015   melanomax4  right upper arm  . Chronic renal insufficiency, stage 3 (moderate) (HCC)   . Diabetes mellitus without complication ([place])   . Diverticulosis   . Endometrial polyp   . External hemorrhoids   . Gout   . Hemochromatosis, hereditary ([place]) 11/24/2012  . History of anemia   . History of duodenal ulcer 1990  . Hyperlipidemia   . Hypertension   . Internal hemorrhoids   . Jaundice    age 37 or 48  . Myocardial infarction ([place]) 2007  . Neuromuscular disorder ([place])    peripheral neuropathy  .  PMB (postmenopausal bleeding)   . PONV (postoperative nausea and vomiting)   . Prolapse of female pelvic organs    uses pessary  . Seizures ([place])    had one at Dr. Antonieta Pert office after getting blood drawn  . Tick bite 08/12/2017   had 3 bites  . Vitamin D deficiency     Past Surgical History:  Procedure Laterality Date  . BREAST SURGERY     left breast lump--benign  . CARDIAC CATHETERIZATION  06/03/2005  . COLONOSCOPY  11/28/2001  . CORONARY ARTERY BYPASS GRAFT  2007   x2 Dr. Roxan Hockey, LIMA to LAD, SVG to PDA  . DILATATION & CURETTAGE/HYSTEROSCOPY WITH MYOSURE N/A 09/28/2017   Procedure: DILATATION & CURETTAGE/HYSTEROSCOPY WITH MYOSURE;  Surgeon: Molli Posey, MD;  Location: [facility];  Service: Gynecology;  Laterality: N/A;  . LIPOMA EXCISION     back  . UPPER GI ENDOSCOPY  01/20/1989    FAMHx:  Family History  Problem Relation Age of Onset  . Stroke Mother   . Hypertension Mother   . Neuropathy Mother   . Stroke Father   . Hypertension Father   . Diabetes  Father   . Cancer Sister 42       sarcoma  . Diabetes Brother   . Arthritis Sister   . Arthritis Sister   . Lupus Sister   . Hemachromatosis Sister   . Arthritis Sister   . Hemachromatosis Sister   . Hemachromatosis Sister   . Diverticulitis Sister   . Diabetes Brother   . Diabetes Brother   . Hemachromatosis Brother   . Lung cancer Brother     SOCHx:   reports that she has never smoked. She has never used smokeless tobacco. She reports current alcohol use. She reports that she does not use drugs.  ALLERGIES:  Allergies  Allergen Reactions  . Cymbalta [Duloxetine Hcl] Other (See Comments)    Profuse sweating  . Januvia [Sitagliptin] Swelling  . Meloxicam Nausea And Vomiting  . Trulicity [Dulaglutide] Nausea Only  . Diclofenac Diarrhea  . Wilder Glade [Dapagliflozin] Other (See Comments)    Dizziness  . Shellfish Allergy Other (See Comments)    Gout  . Tape     Causes skin irritation  . Crestor [Rosuvastatin] Other (See Comments)    Muscle aches on 5 mg daily and 10 mg twice weekly  . Lipitor [Atorvastatin] Other (See Comments)    Muscle aches  . Livalo [Pitavastatin] Other (See Comments)       . Sulfa Antibiotics Rash  . Zetia [Ezetimibe] Other (See Comments)    Muscle aches  . Zocor [Simvastatin] Other (See Comments)    Muscle aches     ROS: Pertinent items noted in HPI and remainder of comprehensive ROS otherwise negative.  HOME MEDS: Current Outpatient Medications  Medication Sig Dispense Refill  . allopurinol (ZYLOPRIM) 100 MG tablet Take 1 tablet (100 mg total) by mouth 2 (two) times daily. (NEEDS TO BE SEEN BEFORE NEXT REFILL) 60 tablet 0  . aspirin 81 MG tablet Take 81 mg by mouth daily.    . colchicine 0.6 MG tablet TAKE (1) TABLET DAILY AS NEEDED. 30 tablet 1  . fluticasone (FLONASE) 50 MCG/ACT nasal spray SPRAY 1 SPRAY IN EACH NOSTRIL ONCE DAILY. 16 g 4  . furosemide (LASIX) 20 MG tablet Take 1 tablet (20 mg total) by mouth daily. (Needs to be seen  before next refill) 30 tablet 0  . glipiZIDE (GLUCOTROL) 10 MG tablet Take 2 tablets (  20 mg total) by mouth daily before breakfast AND 1 tablet (10 mg total) daily with supper. 270 tablet 3  . glucose blood (ONE TOUCH ULTRA TEST) test strip CHECK BLOOD SUGAR 2 TIMES A DAY 100 each prn  . icosapent Ethyl (VASCEPA) 1 g capsule Take 2 capsules (2 g total) by mouth 2 (two) times daily. 120 capsule 11  . Lancets Misc. (ACCU-CHEK FASTCLIX LANCET) KIT Use to check Blood Sugars 1 kit 0  . meclizine (ANTIVERT) 12.5 MG tablet Take 1 tablet (12.5 mg total) by mouth 3 (three) times daily as needed for dizziness. 30 tablet 5  . metoprolol tartrate (LOPRESSOR) 25 MG tablet Take 1 tablet (25 mg total) by mouth 2 (two) times daily. (NEEDS TO BE SEEN BEFORE NEXT REFILL) 60 tablet 0  . olmesartan (BENICAR) 20 MG tablet Take 1 tablet (20 mg total) by mouth daily. (NEEDS TO BE SEEN BEFORE NEXT REFILL) 30 tablet 0  . PRALUENT 150 MG/ML SOAJ Inject 1 Dose into the skin every 14 (fourteen) days. 2 mL 11  . pregabalin (LYRICA) 50 MG capsule TAKE (1) CAPSULE TWICE DAILY. 180 capsule 0  . RESTASIS 0.05 % ophthalmic emulsion     . Semaglutide (RYBELSUS) 7 MG TABS Take 1 tablet by mouth daily before breakfast. 90 tablet 3  . Vitamin D, Ergocalciferol, (DRISDOL) 1.25 MG (50000 UT) CAPS capsule Take 1 capsule (50,000 Units total) by mouth 2 (two) times a week. 26 capsule 3  . VOLTAREN 1 % GEL APPLY 4 GRAMS TO AFFECTED AREA(S) 4 TIMES A DAY AS NEEDED 100 g 0   No current facility-administered medications for this visit.    LABS/IMAGING: No results found for this or any previous visit (from the past 48 hour(s)). No results found.  VITALS: BP (!) 144/76 (BP Location: Left Arm, Patient Position: Sitting, Cuff Size: Large)   Pulse 61   Ht 5\' 3"  (1.6 m)   Wt 186 lb (84.4 kg)   SpO2 94%   BMI 32.95 kg/m   EXAM: General appearance: alert, no distress and mildly obese Neck: no carotid bruit and no JVD Lungs: clear to  auscultation bilaterally Heart: regular rate and rhythm, S1, S2 normal, no murmur, click, rub or gallop Abdomen: soft, non-tender; bowel sounds normal; no masses,  no organomegaly Extremities: extremities normal, atraumatic, no cyanosis or edema, venous stasis dermatitis noted and Walnut sized rubbery mass over the left lower rib border in the midline Pulses: 2+ and symmetric Skin: Skin color, texture, turgor normal. No rashes or lesions Neurologic: Grossly normal Psych: Pleasant  EKG: Normal sinus rhythm at 61, nonspecific T wave changes-personally reviewed  ASSESSMENT: 1. Coronary artery bypass grafting x2 vessels with LIMA to LAD and SVG to PDA in 2007 - negative myoview in 01/2016 2. Asymptomatic PVCs 3. Hypertension-controlled 4. Dyslipidemia - intolerant to all statins and zetia, markedly improved on Praluent 5. Diabetes 6. Gout  7. Possible lipoma  PLAN: 1.   Ms. Stith seems to be doing well.  She denies any chest pain or worsening shortness of breath.  Her cholesterol is markedly improved however triglycerides were elevated.  Her diabetes is now better controlled with A1c of 6.4.  I suspect her triglycerides will be lower as well.  We will get repeat lipids with lab work when she sees her PCP next month.  She did point out small rubbery mass over the left lower rib angle.  This may be a lipoma.  She has had several in the past that have  been removed.  I encouraged her to follow-up with her PCP regarding this.  Plan follow-up with me annually or sooner as necessary  Pixie Casino, MD, [facility], [place] Director of the Advanced Lipid Disorders &  Cardiovascular Risk Reduction Clinic Diplomate of the American Board of Clinical Lipidology Attending Cardiologist  Direct Dial: [phone]  Fax: [phone]  Website:  [email or web address] Kaegan [email or web address] 06/10/2020, 2:05 PM

## 2020-06-20 ENCOUNTER — Ambulatory Visit (INDEPENDENT_AMBULATORY_CARE_PROVIDER_SITE_OTHER): Payer: Medicare Other | Admitting: Licensed Clinical Social Worker

## 2020-06-20 DIAGNOSIS — E1159 Type 2 diabetes mellitus with other circulatory complications: Secondary | ICD-10-CM | POA: Diagnosis not present

## 2020-06-20 DIAGNOSIS — Z951 Presence of aortocoronary bypass graft: Secondary | ICD-10-CM

## 2020-06-20 DIAGNOSIS — E1165 Type 2 diabetes mellitus with hyperglycemia: Secondary | ICD-10-CM

## 2020-06-20 DIAGNOSIS — E782 Mixed hyperlipidemia: Secondary | ICD-10-CM | POA: Diagnosis not present

## 2020-06-20 DIAGNOSIS — E559 Vitamin D deficiency, unspecified: Secondary | ICD-10-CM

## 2020-06-20 DIAGNOSIS — N183 Chronic kidney disease, stage 3 unspecified: Secondary | ICD-10-CM | POA: Diagnosis not present

## 2020-06-20 DIAGNOSIS — I152 Hypertension secondary to endocrine disorders: Secondary | ICD-10-CM | POA: Diagnosis not present

## 2020-06-20 DIAGNOSIS — M1990 Unspecified osteoarthritis, unspecified site: Secondary | ICD-10-CM | POA: Diagnosis not present

## 2020-06-20 NOTE — Patient Instructions (Signed)
Visit Information  PATIENT GOALS: Goals Addressed              This Visit's Progress   .  Complete ADLs daily, manage daily needs (pt-stated)        Timeframe:  Short-Term Goal Priority:  Medium This Visit's Progress:  On Track Start Date:     06/03/2020                        Expected End Date:     09/03/2020                  Follow Up Date 07/25/20   Find Help in My Community (Patient);  Patient needs information on community resources to help her manage medical conditions.   Why is this important?    Knowing how and where to find help for yourself or family in your neighborhood and community is an important skill.   You will want to take some steps to learn how.   Patient Strengths: Has support from her daughter [name] medications as prescribed Attends scheduled medical appointments  Patient Defictis  Needs some help occasionally with ADLs completion Social isolation occasionally  o  Patient Goals:  Client will use coping strategies in next 30 days to help her manage ongoing medical issues faced Client will complete ADLs daily as needed for next 30 days -  Follow Up Plan: LCSW to call client on 07/25/20 to assess client needs    -          Norva [email or web address] Ozier MSW, LCSW Licensed Clinical Social Worker [facility] Care Management [phone]

## 2020-06-20 NOTE — Chronic Care Management (AMB) (Signed)
Chronic Care Management    Clinical Social Work Note  06/20/2020 Name: Carmen [name] MRN: [id number] DOB: [birth year]  Carmen [name] is a 80 y.o. year old female who is a primary care patient of Stacks, Cletus Gash, MD. The CCM team was consulted to assist the patient with chronic disease management and/or care coordination needs related to: [employer] .   Engaged with patient Carmen [name], daughter of patient, by telephone for follow up visit in response to provider referral for social work chronic care management and care coordination services.   Consent to Services:  The patient was given information about Chronic Care Management services, agreed to services, and gave verbal consent prior to initiation of services.  Please see initial visit note for detailed documentation.   Patient agreed to services and consent obtained.   Assessment: Review of patient past medical history, allergies, medications, and health status, including review of relevant consultants reports was performed today as part of a comprehensive evaluation and provision of chronic care management and care coordination services.     SDOH (Social Determinants of Health) assessments and interventions performed:    Advanced Directives Status: See Vynca application for related entries.  CCM Care Plan  Allergies  Allergen Reactions  . Cymbalta [Duloxetine Hcl] Other (See Comments)    Profuse sweating  . Januvia [Sitagliptin] Swelling  . Meloxicam Nausea And Vomiting  . Trulicity [Dulaglutide] Nausea Only  . Diclofenac Diarrhea  . Wilder Glade [Dapagliflozin] Other (See Comments)    Dizziness  . Shellfish Allergy Other (See Comments)    Gout  . Tape     Causes skin irritation  . Crestor [Rosuvastatin] Other (See Comments)    Muscle aches on 5 mg daily and 10 mg twice weekly  . Lipitor [Atorvastatin] Other (See Comments)    Muscle aches  . Livalo [Pitavastatin] Other (See Comments)       . Sulfa Antibiotics  Rash  . Zetia [Ezetimibe] Other (See Comments)    Muscle aches  . Zocor [Simvastatin] Other (See Comments)    Muscle aches     Outpatient Encounter Medications as of 06/20/2020  Medication Sig  . allopurinol (ZYLOPRIM) 100 MG tablet Take 1 tablet (100 mg total) by mouth 2 (two) times daily. (NEEDS TO BE SEEN BEFORE NEXT REFILL)  . aspirin 81 MG tablet Take 81 mg by mouth daily.  . colchicine 0.6 MG tablet TAKE (1) TABLET DAILY AS NEEDED.  . fluticasone (FLONASE) 50 MCG/ACT nasal spray SPRAY 1 SPRAY IN EACH NOSTRIL ONCE DAILY.  . furosemide (LASIX) 20 MG tablet Take 1 tablet (20 mg total) by mouth daily. (Needs to be seen before next refill)  . glipiZIDE (GLUCOTROL) 10 MG tablet Take 2 tablets (20 mg total) by mouth daily before breakfast AND 1 tablet (10 mg total) daily with supper.  Marland Kitchen glucose blood (ONE TOUCH ULTRA TEST) test strip CHECK BLOOD SUGAR 2 TIMES A DAY  . icosapent Ethyl (VASCEPA) 1 g capsule Take 2 capsules (2 g total) by mouth 2 (two) times daily.  . Lancets Misc. (ACCU-CHEK FASTCLIX LANCET) KIT Use to check Blood Sugars  . meclizine (ANTIVERT) 12.5 MG tablet Take 1 tablet (12.5 mg total) by mouth 3 (three) times daily as needed for dizziness.  . metoprolol tartrate (LOPRESSOR) 25 MG tablet Take 1 tablet (25 mg total) by mouth 2 (two) times daily. (NEEDS TO BE SEEN BEFORE NEXT REFILL)  . olmesartan (BENICAR) 20 MG tablet Take 1 tablet (20 mg total) by mouth daily. (NEEDS TO  BE SEEN BEFORE NEXT REFILL)  . PRALUENT 150 MG/ML SOAJ Inject 1 Dose into the skin every 14 (fourteen) days.  . pregabalin (LYRICA) 50 MG capsule TAKE (1) CAPSULE TWICE DAILY.  Marland Kitchen RESTASIS 0.05 % ophthalmic emulsion   . Semaglutide (RYBELSUS) 7 MG TABS Take 1 tablet by mouth daily before breakfast.  . Vitamin D, Ergocalciferol, (DRISDOL) 1.25 MG (50000 UT) CAPS capsule Take 1 capsule (50,000 Units total) by mouth 2 (two) times a week.  . VOLTAREN 1 % GEL APPLY 4 GRAMS TO AFFECTED AREA(S) 4 TIMES A DAY AS  NEEDED   No facility-administered encounter medications on file as of 06/20/2020.    Patient Active Problem List   Diagnosis Date Noted  . Type 2 diabetes mellitus with diabetic polyneuropathy, without long-term current use of insulin ([place]) 12/29/2019  . Type 2 diabetes mellitus with stage 3a chronic kidney disease, without long-term current use of insulin ([place]) 12/29/2019  . Type 2 diabetes mellitus with hyperglycemia, without long-term current use of insulin ([place]) 01/27/2019  . Type 2 diabetes mellitus with stage 3b chronic kidney disease, without long-term current use of insulin ([place]) 01/27/2019  . Chronic renal insufficiency, stage 3 (moderate) ([place]) 09/10/2017  . Chronic pain of both shoulders 04/27/2017  . Vitamin D deficiency 01/28/2016  . Non-insulin treated type 2 diabetes mellitus ([place]) 11/12/2015  . Statin intolerance 08/18/2013  . Neuromuscular disorder ([place])   . Hemochromatosis, hereditary ([place]) 11/24/2012  . S/P CABG x 2 08/09/2012  . PVC's (premature ventricular contractions) 08/09/2012  . Gout 06/20/2012  . Hypertension associated with diabetes ([place]) 06/20/2012  . HLD (hyperlipidemia) 06/20/2012  . Arthritis 06/20/2012    Conditions to be addressed/monitored: Monitor client completion of daily ADLs   Care Plan : General Social Work (Adult)  Updates made by Katha Cabal, LCSW since 06/20/2020 12:00 AM    Problem: Coping Skills (General Plan of Care)     Goal: Manage Medical needs of client and complete ADLs as needed on daily basis   Start Date: 06/03/2020  Expected End Date: 09/03/2020  This Visit's Progress: On track  Recent Progress: On track  Priority: Medium  Note:   Current barriers:   . Patient in need of assistance with connecting to community resources for help as needed in managing her ongoing medical issues faced . Client is challenged with completing ADLs  daily . Acknowledges deficits with meeting unmet needs . Patient is unable to  independently navigate community resource options without care coordination support  Clinical Goals:  patient will work with SW  in next 4 weeks to address concerns related to her management of ongoing health needs of client and management of daily ADLs Patient will talk with LCSW in next 30 days about social isolation issues of client  Clinical Interventions:  . Collaboration with Claretta Fraise, MD regarding development and update of comprehensive plan of care as evidenced by provider attestation and co-signature . Assessment of needs, barriers ,self care challenges of client   .  .Talked with Carmen [name], daughter about client needs . Talked with Carmen [name] about pain issues of client . Talked with Carmen [name] about medication procurement of client  . Talked with Carmen [name] about transport needs of client . Talked with Carmen [name] about mood of client . Talked with Carmen [name] about ADLs completion of client  . Talked with Carmen [name] about vision issues of client . Talked with Carmen [name] about client management of medical issues . Talked with Carmen [name] about ambulation of client (uses a cane to  assist as needed in walking) . Encouraged client or Carmen [name] to call RNCM as needed for nursing support  Patient Strengths: Has support from her daughter [name] medications as prescribed Attends scheduled medical appointments  Patient [place]  Needs some help occasionally with ADLs completion Social isolation occasionally  o  Patient Goals:  Client will use coping strategies in next 30 days to help her manage ongoing medical issues faced Client will complete ADLs daily as needed for next 30 days -  Follow Up Plan: LCSW to call client on 07/25/20 to assess client needs       Norva Riffle.Cassey Hurrell MSW, LCSW Licensed Clinical Social Worker [facility] Care Management [phone]

## 2020-06-25 ENCOUNTER — Other Ambulatory Visit: Payer: Self-pay

## 2020-06-25 ENCOUNTER — Encounter: Payer: Self-pay | Admitting: Family Medicine

## 2020-06-25 ENCOUNTER — Ambulatory Visit (INDEPENDENT_AMBULATORY_CARE_PROVIDER_SITE_OTHER): Payer: Medicare Other | Admitting: Family Medicine

## 2020-06-25 VITALS — BP 133/70 | HR 71 | Temp 97.9°F | Ht 63.0 in | Wt 184.6 lb

## 2020-06-25 DIAGNOSIS — E1122 Type 2 diabetes mellitus with diabetic chronic kidney disease: Secondary | ICD-10-CM | POA: Diagnosis not present

## 2020-06-25 DIAGNOSIS — N1832 Chronic kidney disease, stage 3b: Secondary | ICD-10-CM | POA: Diagnosis not present

## 2020-06-25 DIAGNOSIS — E559 Vitamin D deficiency, unspecified: Secondary | ICD-10-CM

## 2020-06-25 DIAGNOSIS — E782 Mixed hyperlipidemia: Secondary | ICD-10-CM | POA: Diagnosis not present

## 2020-06-25 DIAGNOSIS — Z789 Other specified health status: Secondary | ICD-10-CM

## 2020-06-25 DIAGNOSIS — N183 Chronic kidney disease, stage 3 unspecified: Secondary | ICD-10-CM

## 2020-06-25 NOTE — Progress Notes (Signed)
Subjective:  Patient ID: Carmen [name],  female    DOB: 04/28/1940  Age: 80 y.o.    CC: Medical Management of Chronic Issues   HPI Carmen [name] presents for  follow-up of hypertension. Patient has no history of headache chest pain or shortness of breath or recent cough. Patient also denies symptoms of TIA such as numbness weakness lateralizing. Patient denies side effects from medication. States taking it regularly.  Patient also  in for follow-up of elevated cholesterol. Doing well without complaints on current medication. Denies side effects  including myalgia and arthralgia and nausea. Also in today for liver function testing. Currently no chest pain, shortness of breath or other cardiovascular related symptoms noted.  Follow-up of diabetes. Patient does check blood sugar at home.Care given by endocrine. Carmen [name] is excited that Carmen [name] recent A1c is 6.4. Patient denies symptoms such as excessive hunger or urinary frequency, excessive hunger, nausea No significant hypoglycemic spells noted. Medications reviewed. Pt reports taking them regularly. Pt. denies complication/adverse reaction today.    History Carmen [name] has a past medical history of Arthritis, Asymptomatic PVCs, Bilateral shoulder pain, CAD (coronary artery disease), Cancer ([place]) (11/2015), Chronic renal insufficiency, stage 3 (moderate) ([place]), Diabetes mellitus without complication ([place]), Diverticulosis, Endometrial polyp, External hemorrhoids, Gout, Hemochromatosis, hereditary ([place]) (11/24/2012), History of anemia, History of duodenal ulcer (1990), Hyperlipidemia, Hypertension, Internal hemorrhoids, Jaundice, Myocardial infarction ([place]) (2007), Neuromuscular disorder ([place]), PMB (postmenopausal bleeding), PONV (postoperative nausea and vomiting), Prolapse of female pelvic organs, Seizures ([place]), Tick bite (08/12/2017), Type 2 diabetes mellitus with hyperglycemia, without long-term current use of insulin ([place]) (01/27/2019), and  Vitamin D deficiency.   Carmen [name] has a past surgical history that includes Lipoma excision; Breast surgery; Cardiac catheterization (06/03/2005); Colonoscopy (11/28/2001); Upper gi endoscopy (01/20/1989); Coronary artery bypass graft (2007); and Dilatation & curettage/hysteroscopy with myosure (N/A, 09/28/2017).   Carmen [name] family history includes Arthritis in Carmen [name] sister, sister, and sister; Cancer (age of onset: 78) in Carmen [name] sister; Diabetes in Carmen [name] brother, brother, brother, and father; Diverticulitis in Carmen [name] sister; Hemachromatosis in Carmen [name] brother, sister, sister, and sister; Hypertension in Carmen [name] father and mother; Lung cancer in Carmen [name] brother; Lupus in Carmen [name] sister; Neuropathy in Carmen [name] mother; Stroke in Carmen [name] father and mother.Carmen [name] reports that Carmen [name] has never smoked. Carmen [name] has never used smokeless tobacco. Carmen [name] reports current alcohol use. Carmen [name] reports that Carmen [name] does not use drugs.  Current Outpatient Medications on File Prior to Visit  Medication Sig Dispense Refill  . allopurinol (ZYLOPRIM) 100 MG tablet Take 1 tablet (100 mg total) by mouth 2 (two) times daily. (NEEDS TO BE SEEN BEFORE NEXT REFILL) 60 tablet 0  . aspirin 81 MG tablet Take 81 mg by mouth daily.    . colchicine 0.6 MG tablet TAKE (1) TABLET DAILY AS NEEDED. 30 tablet 1  . fluticasone (FLONASE) 50 MCG/ACT nasal spray SPRAY 1 SPRAY IN EACH NOSTRIL ONCE DAILY. 16 g 4  . furosemide (LASIX) 20 MG tablet Take 1 tablet (20 mg total) by mouth daily. (Needs to be seen before next refill) 30 tablet 0  . glipiZIDE (GLUCOTROL) 10 MG tablet Take 2 tablets (20 mg total) by mouth daily before breakfast AND 1 tablet (10 mg total) daily with supper. 270 tablet 3  . glucose blood (ONE TOUCH ULTRA TEST) test strip CHECK BLOOD SUGAR 2 TIMES A DAY 100 each prn  . icosapent Ethyl (VASCEPA) 1 g capsule Take 2 capsules (2 g total) by mouth 2 (two) times daily. 120 capsule 11  . Lancets Misc. (  ACCU-CHEK FASTCLIX LANCET) KIT Use to check Blood Sugars 1 kit 0  . meclizine (ANTIVERT) 12.5  MG tablet Take 1 tablet (12.5 mg total) by mouth 3 (three) times daily as needed for dizziness. 30 tablet 5  . metoprolol tartrate (LOPRESSOR) 25 MG tablet Take 1 tablet (25 mg total) by mouth 2 (two) times daily. (NEEDS TO BE SEEN BEFORE NEXT REFILL) 60 tablet 0  . olmesartan (BENICAR) 20 MG tablet Take 1 tablet (20 mg total) by mouth daily. (NEEDS TO BE SEEN BEFORE NEXT REFILL) 30 tablet 0  . PRALUENT 150 MG/ML SOAJ Inject 1 Dose into the skin every 14 (fourteen) days. 2 mL 11  . pregabalin (LYRICA) 50 MG capsule TAKE (1) CAPSULE TWICE DAILY. 180 capsule 0  . RESTASIS 0.05 % ophthalmic emulsion     . Semaglutide (RYBELSUS) 7 MG TABS Take 1 tablet by mouth daily before breakfast. 90 tablet 3  . Vitamin D, Ergocalciferol, (DRISDOL) 1.25 MG (50000 UT) CAPS capsule Take 1 capsule (50,000 Units total) by mouth 2 (two) times a week. 26 capsule 3  . VOLTAREN 1 % GEL APPLY 4 GRAMS TO AFFECTED AREA(S) 4 TIMES A DAY AS NEEDED 100 g 0   No current facility-administered medications on file prior to visit.    ROS Review of Systems  Constitutional: Negative.   HENT: Negative.   Eyes: Negative for visual disturbance.  Respiratory: Negative for shortness of breath.   Cardiovascular: Negative for chest pain.  Gastrointestinal: Negative for abdominal pain.  Musculoskeletal: Negative for arthralgias.    Objective:  BP 133/70   Pulse 71   Temp 97.9 F (36.6 C)   Ht 5' 3"  (1.6 m)   Wt 184 lb 9.6 oz (83.7 kg)   SpO2 94%   BMI 32.70 kg/m   BP Readings from Last 3 Encounters:  06/25/20 133/70  06/10/20 (!) 144/76  05/03/20 134/84    Wt Readings from Last 3 Encounters:  06/25/20 184 lb 9.6 oz (83.7 kg)  06/10/20 186 lb (84.4 kg)  05/03/20 182 lb 2 oz (82.6 kg)     Physical Exam Constitutional:      General: Carmen [name] is not in acute distress.    Appearance: Carmen [name] is well-developed.  HENT:     Head: Normocephalic and atraumatic.  Eyes:     Conjunctiva/sclera: Conjunctivae normal.     Pupils:  Pupils are equal, round, and reactive to light.  Neck:     Thyroid: No thyromegaly.  Cardiovascular:     Rate and Rhythm: Normal rate and regular rhythm.     Heart sounds: Normal heart sounds. No murmur heard.   Pulmonary:     Effort: Pulmonary effort is normal. No respiratory distress.     Breath sounds: Normal breath sounds. No wheezing or rales.  Abdominal:     General: Bowel sounds are normal. There is no distension.     Palpations: Abdomen is soft.     Tenderness: There is no abdominal tenderness.  Musculoskeletal:        General: Normal range of motion.     Cervical back: Normal range of motion and neck supple.  Lymphadenopathy:     Cervical: No cervical adenopathy.  Skin:    General: Skin is warm and dry.  Neurological:     Mental Status: Carmen [name] is alert and oriented to person, place, and time.  Psychiatric:        Behavior: Behavior normal.        Thought Content: Thought content normal.  Judgment: Judgment normal.     Diabetic Foot Exam - Simple   No data filed       Assessment & Plan:   Clorissa was seen today for medical management of chronic issues.  Diagnoses and all orders for this visit:  Chronic renal insufficiency, stage 3 (moderate) ([place]) -     CMP14+EGFR  Type 2 diabetes mellitus with stage 3b chronic kidney disease, without long-term current use of insulin ([facility]) -     Lipid panel -     CMP14+EGFR  Vitamin D deficiency -     CMP14+EGFR -     VITAMIN D 25 Hydroxy (Vit-D Deficiency, Fractures)  Statin intolerance  Mixed hyperlipidemia   I am having Carmen [name] maintain Carmen [name] aspirin, Accu-Chek FastClix Lancet, glucose blood, Vitamin D (Ergocalciferol), fluticasone, colchicine, Voltaren, Restasis, icosapent Ethyl, meclizine, pregabalin, Praluent, furosemide, glipiZIDE, Rybelsus, olmesartan, metoprolol tartrate, and allopurinol.  No orders of the defined types were placed in this encounter.    Follow-up: Return in about 6 months  (around 12/25/2020).  Carmen [name], M.D.

## 2020-06-26 ENCOUNTER — Telehealth: Payer: Self-pay

## 2020-06-26 ENCOUNTER — Inpatient Hospital Stay: Payer: Medicare Other | Attending: Hematology & Oncology

## 2020-06-26 ENCOUNTER — Encounter: Payer: Self-pay | Admitting: Hematology & Oncology

## 2020-06-26 ENCOUNTER — Inpatient Hospital Stay (HOSPITAL_BASED_OUTPATIENT_CLINIC_OR_DEPARTMENT_OTHER): Payer: Medicare Other | Admitting: Hematology & Oncology

## 2020-06-26 LAB — CBC WITH DIFFERENTIAL (CANCER CENTER ONLY)
Abs Immature Granulocytes: 0.05 10*3/uL (ref 0.00–0.07)
Basophils Absolute: 0.1 10*3/uL (ref 0.0–0.1)
Basophils Relative: 1 %
Eosinophils Absolute: 0.3 10*3/uL (ref 0.0–0.5)
Eosinophils Relative: 3 %
HCT: 41.8 % (ref 36.0–46.0)
Hemoglobin: 14.2 g/dL (ref 12.0–15.0)
Immature Granulocytes: 1 %
Lymphocytes Relative: 43 %
Lymphs Abs: 3.9 10*3/uL (ref 0.7–4.0)
MCH: 30.3 pg (ref 26.0–34.0)
MCHC: 34 g/dL (ref 30.0–36.0)
MCV: 89.1 fL (ref 80.0–100.0)
Monocytes Absolute: 0.5 10*3/uL (ref 0.1–1.0)
Monocytes Relative: 6 %
Neutro Abs: 4.4 10*3/uL (ref 1.7–7.7)
Neutrophils Relative %: 46 %
Platelet Count: 165 10*3/uL (ref 150–400)
RBC: 4.69 MIL/uL (ref 3.87–5.11)
RDW: 13.5 % (ref 11.5–15.5)
WBC Count: 9.2 10*3/uL (ref 4.0–10.5)
nRBC: 0 % (ref 0.0–0.2)

## 2020-06-26 LAB — CMP (CANCER CENTER ONLY)
ALT: 18 U/L (ref 0–44)
AST: 20 U/L (ref 15–41)
Albumin: 4.3 g/dL (ref 3.5–5.0)
Alkaline Phosphatase: 81 U/L (ref 38–126)
Anion gap: 8 (ref 5–15)
BUN: 22 mg/dL (ref 8–23)
CO2: 28 mmol/L (ref 22–32)
Calcium: 10.4 mg/dL — ABNORMAL HIGH (ref 8.9–10.3)
Chloride: 104 mmol/L (ref 98–111)
Creatinine: 1.2 mg/dL — ABNORMAL HIGH (ref 0.44–1.00)
GFR, Estimated: 46 mL/min — ABNORMAL LOW (ref >60)
Glucose, Bld: 177 mg/dL — ABNORMAL HIGH (ref 70–99)
Potassium: 3.9 mmol/L (ref 3.5–5.1)
Sodium: 140 mmol/L (ref 135–145)
Total Bilirubin: 0.4 mg/dL (ref 0.3–1.2)
Total Protein: 7 g/dL (ref 6.5–8.1)

## 2020-06-26 NOTE — Telephone Encounter (Signed)
Called and left a vm with f/u appt per los   Jaimen Melone

## 2020-06-26 NOTE — Progress Notes (Signed)
Hematology and Oncology Follow Up Visit  Carmen [name] [id number] Oct 18, 1940 80 y.o. 06/26/2020   Principle Diagnosis:  Hemochromatosis (double heterozygote for C282Y and S65C mutations).  Current Therapy:    Phlebotomy to maintain ferritin less than 100     Interim History:  Ms.  Lukes is back for followup.  She is really looking quite good.  We saw her 6 months ago.  She was phlebotomized back in October.  She has had no problems since we last saw her.  Her blood sugars are still her biggest issue.  She has diabetes.  She is trying to control this.  She sees her endocrinologist for this.  She has had no issues with bowels or bladder.  She has had no cough or shortness of breath.  She has had no issues with the coronavirus.  She has had no leg swelling.  There has been no rashes.  Overall, I would say her performance status is ECOG 1.   Medications:  Current Outpatient Medications:  .  allopurinol (ZYLOPRIM) 100 MG tablet, Take 1 tablet (100 mg total) by mouth 2 (two) times daily. (NEEDS TO BE SEEN BEFORE NEXT REFILL), Disp: 60 tablet, Rfl: 0 .  aspirin 81 MG tablet, Take 81 mg by mouth daily., Disp: , Rfl:  .  colchicine 0.6 MG tablet, TAKE (1) TABLET DAILY AS NEEDED., Disp: 30 tablet, Rfl: 1 .  fluticasone (FLONASE) 50 MCG/ACT nasal spray, SPRAY 1 SPRAY IN EACH NOSTRIL ONCE DAILY. (Patient taking differently: SPRAY 1 SPRAY IN EACH NOSTRIL ONCE DAILY PRN.), Disp: 16 g, Rfl: 4 .  furosemide (LASIX) 20 MG tablet, Take 1 tablet (20 mg total) by mouth daily. (Needs to be seen before next refill), Disp: 30 tablet, Rfl: 0 .  glipiZIDE (GLUCOTROL) 10 MG tablet, Take 2 tablets (20 mg total) by mouth daily before breakfast AND 1 tablet (10 mg total) daily with supper., Disp: 270 tablet, Rfl: 3 .  glucose blood (ONE TOUCH ULTRA TEST) test strip, CHECK BLOOD SUGAR 2 TIMES A DAY, Disp: 100 each, Rfl: prn .  icosapent Ethyl (VASCEPA) 1 g capsule, Take 2 capsules (2 g total) by mouth 2  (two) times daily., Disp: 120 capsule, Rfl: 11 .  Lancets Misc. (ACCU-CHEK FASTCLIX LANCET) KIT, Use to check Blood Sugars, Disp: 1 kit, Rfl: 0 .  metoprolol tartrate (LOPRESSOR) 25 MG tablet, Take 1 tablet (25 mg total) by mouth 2 (two) times daily. (NEEDS TO BE SEEN BEFORE NEXT REFILL), Disp: 60 tablet, Rfl: 0 .  olmesartan (BENICAR) 20 MG tablet, Take 1 tablet (20 mg total) by mouth daily. (NEEDS TO BE SEEN BEFORE NEXT REFILL), Disp: 30 tablet, Rfl: 0 .  PRALUENT 150 MG/ML SOAJ, Inject 1 Dose into the skin every 14 (fourteen) days., Disp: 2 mL, Rfl: 11 .  pregabalin (LYRICA) 50 MG capsule, TAKE (1) CAPSULE TWICE DAILY., Disp: 180 capsule, Rfl: 0 .  RESTASIS 0.05 % ophthalmic emulsion, Place 2 drops into both eyes 2 (two) times daily as needed., Disp: , Rfl:  .  Semaglutide (RYBELSUS) 7 MG TABS, Take 1 tablet by mouth daily before breakfast., Disp: 90 tablet, Rfl: 3 .  Vitamin D, Ergocalciferol, (DRISDOL) 1.25 MG (50000 UT) CAPS capsule, Take 1 capsule (50,000 Units total) by mouth 2 (two) times a week. (Patient taking differently: Take 10,000 Units by mouth 3 (three) times a week.), Disp: 26 capsule, Rfl: 3 .  VOLTAREN 1 % GEL, APPLY 4 GRAMS TO AFFECTED AREA(S) 4 TIMES A DAY AS NEEDED,  Disp: 100 g, Rfl: 0 .  meclizine (ANTIVERT) 12.5 MG tablet, Take 1 tablet (12.5 mg total) by mouth 3 (three) times daily as needed for dizziness. (Patient not taking: Reported on 06/26/2020), Disp: 30 tablet, Rfl: 5  Allergies:  Allergies  Allergen Reactions  . Cymbalta [Duloxetine Hcl] Other (See Comments)    Profuse sweating  . Januvia [Sitagliptin] Swelling  . Meloxicam Nausea And Vomiting  . Trulicity [Dulaglutide] Nausea Only  . Diclofenac Diarrhea  . Wilder Glade [Dapagliflozin] Other (See Comments)    Dizziness  . Shellfish Allergy Other (See Comments)    Gout  . Tape     Causes skin irritation  . Crestor [Rosuvastatin] Other (See Comments)    Muscle aches on 5 mg daily and 10 mg twice weekly  . Lipitor  [Atorvastatin] Other (See Comments)    Muscle aches  . Livalo [Pitavastatin] Other (See Comments)       . Sulfa Antibiotics Rash  . Zetia [Ezetimibe] Other (See Comments)    Muscle aches  . Zocor [Simvastatin] Other (See Comments)    Muscle aches     Past Medical History, Surgical history, Social history, and Family History were reviewed and updated.  Review of Systems: Review of Systems  Constitutional: Negative.   HENT: Negative.   Eyes: Negative.   Respiratory: Negative.   Cardiovascular: Negative.   Gastrointestinal: Negative.   Genitourinary: Negative.   Musculoskeletal: Negative.   Skin: Negative.   Neurological: Negative.   Endo/Heme/Allergies: Negative.   Psychiatric/Behavioral: Negative.     Physical Exam:  oral temperature is 98.3 F (36.8 C). Her blood pressure is 149/76 (abnormal) and her pulse is 64. Her respiration is 20 and oxygen saturation is 97%.   Physical Exam Vitals reviewed.  HENT:     Head: Normocephalic and atraumatic.  Eyes:     Pupils: Pupils are equal, round, and reactive to light.  Cardiovascular:     Rate and Rhythm: Normal rate and regular rhythm.     Heart sounds: Normal heart sounds.  Pulmonary:     Effort: Pulmonary effort is normal.     Breath sounds: Normal breath sounds.  Abdominal:     General: Bowel sounds are normal.     Palpations: Abdomen is soft.  Musculoskeletal:        General: No tenderness or deformity. Normal range of motion.     Cervical back: Normal range of motion.  Lymphadenopathy:     Cervical: No cervical adenopathy.  Skin:    General: Skin is warm and dry.     Findings: No erythema or rash.  Neurological:     Mental Status: She is alert and oriented to person, place, and time.  Psychiatric:        Behavior: Behavior normal.        Thought Content: Thought content normal.        Judgment: Judgment normal.      Lab Results  Component Value Date   WBC 9.2 06/26/2020   HGB 14.2 06/26/2020   HCT  41.8 06/26/2020   MCV 89.1 06/26/2020   PLT 165 06/26/2020     Chemistry      Component Value Date/Time   NA 140 06/26/2020 1313   NA 146 (H) 11/13/2019 1552   NA 147 (H) 01/22/2017 1451   NA 140 12/31/2015 1052   K 3.9 06/26/2020 1313   K 4.9 (H) 01/22/2017 1451   K 4.2 12/31/2015 1052   CL 104 06/26/2020 1313   CL  106 01/22/2017 1451   CO2 28 06/26/2020 1313   CO2 29 01/22/2017 1451   CO2 23 12/31/2015 1052   BUN 22 06/26/2020 1313   BUN 17 11/13/2019 1552   BUN 23 (H) 01/22/2017 1451   BUN 21.4 12/31/2015 1052   CREATININE 1.20 (H) 06/26/2020 1313   CREATININE 1.6 (H) 01/22/2017 1451   CREATININE 1.4 (H) 12/31/2015 1052      Component Value Date/Time   CALCIUM 10.4 (H) 06/26/2020 1313   CALCIUM 10.3 01/22/2017 1451   CALCIUM 9.7 12/31/2015 1052   ALKPHOS 81 06/26/2020 1313   ALKPHOS 65 01/22/2017 1451   ALKPHOS 87 12/31/2015 1052   AST 20 06/26/2020 1313   AST 31 12/31/2015 1052   ALT 18 06/26/2020 1313   ALT 25 01/22/2017 1451   ALT 36 12/31/2015 1052   BILITOT 0.4 06/26/2020 1313   BILITOT 0.42 12/31/2015 1052      Impression and Plan: Ms. Hohman is 80 year old white female with hemachromatosis. She is a double heterozygote.  Of note, she just had her 80th birthday.  She had a wonderful 80th birthday.  I have to see what her iron studies look like.  Hopefully, she will not need to be phlebotomized.  I just do not think that hemochromatosis will be a problem for her.  Again her blood sugars are more of an issue than anything else.  I just do not think this is related to the hemochromatosis since we have her iron levels under good control.   We will still plan for a follow-up in 6 months.  Volanda Napoleon, MD 4/6/20221:58 PM

## 2020-06-27 ENCOUNTER — Encounter: Payer: Self-pay | Admitting: *Deleted

## 2020-06-27 ENCOUNTER — Other Ambulatory Visit: Payer: Self-pay | Admitting: Family Medicine

## 2020-06-27 ENCOUNTER — Telehealth: Payer: Self-pay

## 2020-06-27 DIAGNOSIS — E782 Mixed hyperlipidemia: Secondary | ICD-10-CM

## 2020-06-27 LAB — FERRITIN: Ferritin: 121 ng/mL (ref 11–307)

## 2020-06-27 LAB — IRON AND TIBC
Iron: 98 ug/dL (ref 41–142)
Saturation Ratios: 36 % (ref 21–57)
TIBC: 275 ug/dL (ref 236–444)
UIBC: 177 ug/dL (ref 120–384)

## 2020-06-27 NOTE — Telephone Encounter (Signed)
-----   Message from Volanda Napoleon, MD sent at 06/27/2020  1:09 PM EDT ----- Call - the iron is too high. She needs 2 phlebotomies. Laurey Arrow

## 2020-07-02 ENCOUNTER — Other Ambulatory Visit: Payer: Self-pay

## 2020-07-02 ENCOUNTER — Other Ambulatory Visit: Payer: Medicare Other

## 2020-07-02 DIAGNOSIS — N1832 Chronic kidney disease, stage 3b: Secondary | ICD-10-CM | POA: Diagnosis not present

## 2020-07-02 DIAGNOSIS — E559 Vitamin D deficiency, unspecified: Secondary | ICD-10-CM | POA: Diagnosis not present

## 2020-07-02 DIAGNOSIS — N183 Chronic kidney disease, stage 3 unspecified: Secondary | ICD-10-CM | POA: Diagnosis not present

## 2020-07-02 DIAGNOSIS — E1122 Type 2 diabetes mellitus with diabetic chronic kidney disease: Secondary | ICD-10-CM | POA: Diagnosis not present

## 2020-07-03 ENCOUNTER — Other Ambulatory Visit: Payer: Self-pay | Admitting: Internal Medicine

## 2020-07-03 LAB — CMP14+EGFR
ALT: 18 IU/L (ref 0–32)
AST: 20 IU/L (ref 0–40)
Albumin/Globulin Ratio: 1.8 (ref 1.2–2.2)
Albumin: 4.6 g/dL (ref 3.7–4.7)
Alkaline Phosphatase: 103 IU/L (ref 44–121)
BUN/Creatinine Ratio: 18 (ref 12–28)
BUN: 29 mg/dL — ABNORMAL HIGH (ref 8–27)
Bilirubin Total: 0.4 mg/dL (ref 0.0–1.2)
CO2: 19 mmol/L — ABNORMAL LOW (ref 20–29)
Calcium: 10 mg/dL (ref 8.7–10.3)
Chloride: 103 mmol/L (ref 96–106)
Creatinine, Ser: 1.6 mg/dL — ABNORMAL HIGH (ref 0.57–1.00)
Globulin, Total: 2.5 g/dL (ref 1.5–4.5)
Glucose: 191 mg/dL — ABNORMAL HIGH (ref 65–99)
Potassium: 4.3 mmol/L (ref 3.5–5.2)
Sodium: 141 mmol/L (ref 134–144)
Total Protein: 7.1 g/dL (ref 6.0–8.5)
eGFR: 32 mL/min/{1.73_m2} — ABNORMAL LOW (ref >59)

## 2020-07-03 LAB — LIPID PANEL
Chol/HDL Ratio: 3.9 ratio (ref 0.0–4.4)
Cholesterol, Total: 148 mg/dL (ref 100–199)
HDL: 38 mg/dL — ABNORMAL LOW (ref >39)
LDL Chol Calc (NIH): 75 mg/dL (ref 0–99)
Triglycerides: 212 mg/dL — ABNORMAL HIGH (ref 0–149)
VLDL Cholesterol Cal: 35 mg/dL (ref 5–40)

## 2020-07-03 LAB — VITAMIN D 25 HYDROXY (VIT D DEFICIENCY, FRACTURES): Vit D, 25-Hydroxy: 36.9 ng/mL (ref 30.0–100.0)

## 2020-07-05 ENCOUNTER — Other Ambulatory Visit: Payer: Self-pay

## 2020-07-05 ENCOUNTER — Inpatient Hospital Stay: Payer: Medicare Other

## 2020-07-05 NOTE — Progress Notes (Signed)
Carmen [name] presents today for phlebotomy per MD orders. Phlebotomy procedure started at 1430 and ended at 1445. 522 grams removed via 20 g angiocatheter.  Refreshments given Patient observed for 30 minutes after procedure without any incident. Patient tolerated procedure well. IV needle removed intact.

## 2020-07-05 NOTE — Patient Instructions (Signed)
Therapeutic Phlebotomy Therapeutic phlebotomy is the planned removal of blood from a person's body for the purpose of treating a medical condition. The procedure is similar to donating blood. Usually, about a pint (470 mL, or 0.47 L) of blood is removed. The average adult has 9-12 pints (4.3-5.7 L) of blood in the body. Therapeutic phlebotomy may be used to treat the following medical conditions:  Hemochromatosis. This is a condition in which the blood contains too much iron.  Polycythemia vera. This is a condition in which the blood contains too many red blood cells.  Porphyria cutanea tarda. This is a disease in which an important part of hemoglobin is not made properly. It results in the buildup of abnormal amounts of porphyrins in the body.  Sickle cell disease. This is a condition in which the red blood cells form an abnormal crescent shape rather than a round shape. Tell a health care provider about:  Any allergies you have.  All medicines you are taking, including vitamins, herbs, eye drops, creams, and over-the-counter medicines.  Any problems you or family members have had with anesthetic medicines.  Any blood disorders you have.  Any surgeries you have had.  Any medical conditions you have.  Whether you are pregnant or may be pregnant. What are the risks? Generally, this is a safe procedure. However, problems may occur, including:  Nausea or light-headedness.  Low blood pressure (hypotension).  Soreness, bleeding, swelling, or bruising at the needle insertion site.  Infection. What happens before the procedure?  Follow instructions from your health care provider about eating or drinking restrictions.  Ask your health care provider about: ? Changing or stopping your regular medicines. This is especially important if you are taking diabetes medicines or blood thinners (anticoagulants). ? Taking medicines such as aspirin and ibuprofen. These medicines can thin your  blood. Do not take these medicines unless your health care provider tells you to take them. ? Taking over-the-counter medicines, vitamins, herbs, and supplements.  Wear clothing with sleeves that can be raised above the elbow.  Plan to have someone take you home from the hospital or clinic.  You may have a blood sample taken.  Your blood pressure, pulse rate, and breathing rate will be measured. What happens during the procedure?  To lower your risk of infection: ? Your health care team will wash or sanitize their hands. ? Your skin will be cleaned with an antiseptic.  You may be given a medicine to numb the area (local anesthetic).  A tourniquet will be placed on your arm.  A needle will be inserted into one of your veins.  Tubing and a collection bag will be attached to that needle.  Blood will flow through the needle and tubing into the collection bag.  The collection bag will be placed lower than your arm to allow gravity to help the flow of blood into the bag.  You may be asked to open and close your hand slowly and continually during the entire collection.  After the specified amount of blood has been removed from your body, the collection bag and tubing will be clamped.  The needle will be removed from your vein.  Pressure will be held on the site of the needle insertion to stop the bleeding.  A bandage (dressing) will be placed over the needle insertion site. The procedure may vary among health care providers and hospitals.   What happens after the procedure?  Your blood pressure, pulse rate, and breathing rate will   be measured after the procedure.  You will be encouraged to drink fluids.  Your recovery will be assessed and monitored.  You can return to your normal activities as told by your health care provider. Summary  Therapeutic phlebotomy is the planned removal of blood from a person's body for the purpose of treating a medical condition.  Therapeutic  phlebotomy may be used to treat hemochromatosis, polycythemia vera, porphyria cutanea tarda, or sickle cell disease.  In the procedure, a needle is inserted and about a pint (470 mL, or 0.47 L) of blood is removed. The average adult has 9-12 pints (4.3-5.7 L) of blood in the body.  This is generally a safe procedure, but it can sometimes cause problems such as nausea, light-headedness, or low blood pressure (hypotension). This information is not intended to replace advice given to you by your health care provider. Make sure you discuss any questions you have with your health care provider. Document Revised: 03/25/2017 Document Reviewed: 03/25/2017 Elsevier Patient Education  2021 Elsevier Inc.  

## 2020-07-08 ENCOUNTER — Other Ambulatory Visit: Payer: Self-pay | Admitting: Family Medicine

## 2020-07-08 DIAGNOSIS — E1140 Type 2 diabetes mellitus with diabetic neuropathy, unspecified: Secondary | ICD-10-CM

## 2020-07-08 NOTE — Telephone Encounter (Signed)
Last office visit 06/25/20  Not on current med list but on history med list Last refill 12/29/19, 100 grams, no refills

## 2020-07-08 NOTE — Telephone Encounter (Signed)
Last office visit 06/25/20 Last refill 02/28/20, #180, no refills

## 2020-07-25 ENCOUNTER — Telehealth: Payer: Medicare Other

## 2020-07-26 ENCOUNTER — Other Ambulatory Visit: Payer: Self-pay | Admitting: Internal Medicine

## 2020-08-01 DIAGNOSIS — Z85828 Personal history of other malignant neoplasm of skin: Secondary | ICD-10-CM | POA: Diagnosis not present

## 2020-08-01 DIAGNOSIS — L570 Actinic keratosis: Secondary | ICD-10-CM | POA: Diagnosis not present

## 2020-08-01 DIAGNOSIS — Z85820 Personal history of malignant melanoma of skin: Secondary | ICD-10-CM | POA: Diagnosis not present

## 2020-08-01 DIAGNOSIS — L905 Scar conditions and fibrosis of skin: Secondary | ICD-10-CM | POA: Diagnosis not present

## 2020-08-01 DIAGNOSIS — D485 Neoplasm of uncertain behavior of skin: Secondary | ICD-10-CM | POA: Diagnosis not present

## 2020-08-14 ENCOUNTER — Other Ambulatory Visit: Payer: Self-pay | Admitting: Family Medicine

## 2020-08-21 DIAGNOSIS — H40033 Anatomical narrow angle, bilateral: Secondary | ICD-10-CM | POA: Diagnosis not present

## 2020-08-21 DIAGNOSIS — H04123 Dry eye syndrome of bilateral lacrimal glands: Secondary | ICD-10-CM | POA: Diagnosis not present

## 2020-09-03 ENCOUNTER — Ambulatory Visit (INDEPENDENT_AMBULATORY_CARE_PROVIDER_SITE_OTHER): Payer: Medicare Other | Admitting: Licensed Clinical Social Worker

## 2020-09-03 DIAGNOSIS — M1990 Unspecified osteoarthritis, unspecified site: Secondary | ICD-10-CM

## 2020-09-03 DIAGNOSIS — N1832 Chronic kidney disease, stage 3b: Secondary | ICD-10-CM | POA: Diagnosis not present

## 2020-09-03 DIAGNOSIS — E1122 Type 2 diabetes mellitus with diabetic chronic kidney disease: Secondary | ICD-10-CM

## 2020-09-03 DIAGNOSIS — I152 Hypertension secondary to endocrine disorders: Secondary | ICD-10-CM | POA: Diagnosis not present

## 2020-09-03 DIAGNOSIS — E1159 Type 2 diabetes mellitus with other circulatory complications: Secondary | ICD-10-CM | POA: Diagnosis not present

## 2020-09-03 DIAGNOSIS — E782 Mixed hyperlipidemia: Secondary | ICD-10-CM | POA: Diagnosis not present

## 2020-09-03 DIAGNOSIS — E559 Vitamin D deficiency, unspecified: Secondary | ICD-10-CM

## 2020-09-03 DIAGNOSIS — Z951 Presence of aortocoronary bypass graft: Secondary | ICD-10-CM

## 2020-09-03 NOTE — Patient Instructions (Signed)
Visit Information  PATIENT GOALS:  Goals Addressed               This Visit's Progress     Complete ADLs daily, manage daily needs (pt-stated)        Timeframe:  Short-Term Goal Priority:  Medium This Visit's Progress:  On Track Start Date:    09/03/20                        Expected End Date:  12/04/20                  Follow Up Date 10/16/20  Find Help in My Community (Patient);  Patient needs information on community resources to help her manage medical conditions.   Why is this important?   Knowing how and where to find help for yourself or family in your neighborhood and community is an important skill.  You will want to take some steps to learn how.   Patient Strengths: Has support from her daughter [name] medications as prescribed Attends scheduled medical appointments  Patient Defictis  Needs some help occasionally with ADLs completion Social isolation occasionally   Patient Goals:  Client will use coping strategies in next 30 days to help her manage ongoing medical issues faced Client will complete ADLs daily as needed for next 30 days -  Follow Up Plan: LCSW to call client on 10/16/20 to assess client needs                 Norva Riffle.Aaima Gaddie MSW, LCSW Licensed Clinical Social Worker [facility] Care Management [phone]

## 2020-09-03 NOTE — Chronic Care Management (AMB) (Signed)
Chronic Care Management    Clinical Social Work Note  09/03/2020 Name: Carmen [name] MRN: [id number] DOB: 1940/05/04  Carmen [name] is a 80 y.o. year old female who is a primary care patient of Stacks, Cletus Gash, MD. The CCM team was consulted to assist the patient with chronic disease management and/or care coordination needs related to: [employer] .   Engaged with patient by telephone for follow up visit in response to provider referral for social work chronic care management and care coordination services.   Consent to Services:  The patient was given information about Chronic Care Management services, agreed to services, and gave verbal consent prior to initiation of services.  Please see initial visit note for detailed documentation.   Patient agreed to services and consent obtained.   Assessment: Review of patient past medical history, allergies, medications, and health status, including review of relevant consultants reports was performed today as part of a comprehensive evaluation and provision of chronic care management and care coordination services.     SDOH (Social Determinants of Health) assessments and interventions performed:  SDOH Interventions    Flowsheet Row Most Recent Value  SDOH Interventions   Depression Interventions/Treatment  --  [informed client of LCSW support and of RNCM support]        Advanced Directives Status: See Vynca application for related entries.  CCM Care Plan  Allergies  Allergen Reactions   Cymbalta [Duloxetine Hcl] Other (See Comments)    Profuse sweating   Januvia [Sitagliptin] Swelling   Meloxicam Nausea And Vomiting   Trulicity [Dulaglutide] Nausea Only   Diclofenac Diarrhea   Farxiga [Dapagliflozin] Other (See Comments)    Dizziness   Shellfish Allergy Other (See Comments)    Gout   Tape     Causes skin irritation   Crestor [Rosuvastatin] Other (See Comments)    Muscle aches on 5 mg daily and 10 mg twice  weekly   Lipitor [Atorvastatin] Other (See Comments)    Muscle aches   Livalo [Pitavastatin] Other (See Comments)        Sulfa Antibiotics Rash   Zetia [Ezetimibe] Other (See Comments)    Muscle aches   Zocor [Simvastatin] Other (See Comments)    Muscle aches     Outpatient Encounter Medications as of 09/03/2020  Medication Sig   pregabalin (LYRICA) 50 MG capsule TAKE (1) CAPSULE TWICE DAILY.   allopurinol (ZYLOPRIM) 100 MG tablet TAKE  (1)  TABLET TWICE A DAY.   aspirin 81 MG tablet Take 81 mg by mouth daily.   colchicine 0.6 MG tablet TAKE (1) TABLET DAILY AS NEEDED.   diclofenac Sodium (VOLTAREN) 1 % GEL APPLY 4 GRAMS TO AFFECTED AREA(S) 4 TIMES A DAY AS NEEDED   fluticasone (FLONASE) 50 MCG/ACT nasal spray SPRAY 1 SPRAY IN EACH NOSTRIL ONCE DAILY. (Patient taking differently: SPRAY 1 SPRAY IN EACH NOSTRIL ONCE DAILY PRN.)   furosemide (LASIX) 20 MG tablet Take 1 tablet (20 mg total) by mouth daily. (Needs to be seen before next refill)   glipiZIDE (GLUCOTROL) 10 MG tablet TAKE 2 TABLETS BEFORE BREAKFAST AND 1 TABLET DAILY BEFORE SUPPER   glucose blood (ONE TOUCH ULTRA TEST) test strip CHECK BLOOD SUGAR 2 TIMES A DAY   icosapent Ethyl (VASCEPA) 1 g capsule TAKE (2) CAPSULES TWICE DAILY.   Lancets Misc. (ACCU-CHEK FASTCLIX LANCET) KIT Use to check Blood Sugars   meclizine (ANTIVERT) 12.5 MG tablet Take 1 tablet (12.5 mg total) by mouth 3 (three) times daily as needed  for dizziness. (Patient not taking: Reported on 06/26/2020)   metoprolol tartrate (LOPRESSOR) 25 MG tablet TAKE  (1)  TABLET TWICE A DAY.   olmesartan (BENICAR) 20 MG tablet TAKE 1 TABLET DAILY   PRALUENT 150 MG/ML SOAJ Inject 1 Dose into the skin every 14 (fourteen) days.   RESTASIS 0.05 % ophthalmic emulsion Place 2 drops into both eyes 2 (two) times daily as needed.   RYBELSUS 7 MG TABS TAKE 1 TABLET DAILY BEFORE BREAKFAST   Vitamin D, Ergocalciferol, (DRISDOL) 1.25 MG (50000 UT) CAPS capsule Take 1 capsule (50,000  Units total) by mouth 2 (two) times a week. (Patient taking differently: Take 10,000 Units by mouth 3 (three) times a week.)   VOLTAREN 1 % GEL APPLY 4 GRAMS TO AFFECTED AREA(S) 4 TIMES A DAY AS NEEDED   No facility-administered encounter medications on file as of 09/03/2020.    Patient Active Problem List   Diagnosis Date Noted   Type 2 diabetes mellitus with diabetic polyneuropathy, without long-term current use of insulin ([place]) 12/29/2019   Type 2 diabetes mellitus with stage 3b chronic kidney disease, without long-term current use of insulin ([place]) 01/27/2019   Chronic renal insufficiency, stage 3 (moderate) ([place]) 09/10/2017   Chronic pain of both shoulders 04/27/2017   Vitamin D deficiency 01/28/2016   Non-insulin treated type 2 diabetes mellitus ([place]) 11/12/2015   Statin intolerance 08/18/2013   Neuromuscular disorder ([place])    Hemochromatosis, hereditary ([place]) 11/24/2012   S/P CABG x 2 08/09/2012   PVC's (premature ventricular contractions) 08/09/2012   Gout 06/20/2012   Hypertension associated with diabetes ([place]) 06/20/2012   HLD (hyperlipidemia) 06/20/2012   Arthritis 06/20/2012    Conditions to be addressed/monitored: Monitor client completion of ADLs as able   Care Plan : General Social Work (Adult)  Updates made by Katha Cabal, LCSW since 09/03/2020 12:00 AM     Problem: Coping Skills (General Plan of Care)      Goal: Manage Medical needs of client and complete ADLs as needed on daily basis   Start Date: 09/03/2020  Expected End Date: 12/04/2020  This Visit's Progress: On track  Recent Progress: On track  Priority: Medium  Note:   Current barriers:   Patient in need of assistance with connecting to community resources for help as needed in managing her ongoing medical issues faced Client is challenged with completing ADLs  daily Patient is unable to independently navigate community resource options without care coordination support  Clinical Goals:  patient  will work with SW  in next 4 weeks to address concerns related to her management of ongoing health needs of client and management of daily ADLs Patient will talk with LCSW in next 30 days about social isolation issues of client  Clinical Interventions:  Collaboration with Claretta Fraise, MD regarding development and update of comprehensive plan of care as evidenced by provider attestation and co-signature Assessment of needs, barriers ,self care challenges of client   Talked with client  about client needs Talked with client about pain issues of client Talked with client about medication procurement of client  Talked with client about transport needs of client Talked with client about mood of client Talked with client about ADLs completion of client  Talked with client about vision issues of client (client is in process of getting new glasses) Talked with client about ambulation of client (uses a cane to assist as needed in walking) Encouraged client to call RNCM as needed for nursing support Talked with  client about relaxation techniques (likes to play solitaire and read, watches TV) Talked with client  about sleeping issues of client Talked with client about hearing needs of client (client has hearing aides to use) Talked with client about family support (has support from her daughters) Talked with client about DME (has cane, has grab bars in bathroom)  Patient Strengths: Has support from her daughter Takes medications as prescribed Attends scheduled medical appointments  Patient Defictis  Needs some help occasionally with ADLs completion Social isolation occasionally   Patient Goals:  Client will use coping strategies in next 30 days to help her manage ongoing medical issues faced Client will complete ADLs daily as needed for next 30 days -  Follow Up Plan: LCSW to call client on 10/16/20 to assess client needs       Norva Riffle.Jaymon Dudek MSW, LCSW Licensed Clinical Social  Worker [facility] Care Management [phone]

## 2020-10-07 ENCOUNTER — Telehealth: Payer: Self-pay | Admitting: Family Medicine

## 2020-10-07 ENCOUNTER — Other Ambulatory Visit: Payer: Self-pay | Admitting: Family Medicine

## 2020-10-07 DIAGNOSIS — E1140 Type 2 diabetes mellitus with diabetic neuropathy, unspecified: Secondary | ICD-10-CM

## 2020-10-07 NOTE — Telephone Encounter (Signed)
I usually send in a patch. It is applied behind an ear and left on for three days, then replaced. Is that okay?

## 2020-10-07 NOTE — Telephone Encounter (Signed)
Yes she is okay with this

## 2020-10-08 ENCOUNTER — Other Ambulatory Visit: Payer: Self-pay | Admitting: Family Medicine

## 2020-10-08 MED ORDER — SCOPOLAMINE 1 MG/3DAYS TD PT72: MEDICATED_PATCH | TRANSDERMAL | 0 refills | Status: DC

## 2020-10-08 NOTE — Telephone Encounter (Signed)
Please let the patient know that I sent their prescription to their pharmacy. Thanks, WS 

## 2020-10-11 ENCOUNTER — Other Ambulatory Visit: Payer: Self-pay | Admitting: Family Medicine

## 2020-10-11 DIAGNOSIS — I152 Hypertension secondary to endocrine disorders: Secondary | ICD-10-CM

## 2020-10-11 DIAGNOSIS — E1159 Type 2 diabetes mellitus with other circulatory complications: Secondary | ICD-10-CM

## 2020-10-11 DIAGNOSIS — Z951 Presence of aortocoronary bypass graft: Secondary | ICD-10-CM

## 2020-10-16 ENCOUNTER — Telehealth: Payer: Medicare Other

## 2020-10-17 ENCOUNTER — Ambulatory Visit (INDEPENDENT_AMBULATORY_CARE_PROVIDER_SITE_OTHER): Payer: Medicare Other | Admitting: *Deleted

## 2020-10-17 ENCOUNTER — Other Ambulatory Visit: Payer: Self-pay | Admitting: Family Medicine

## 2020-10-17 ENCOUNTER — Telehealth: Payer: Self-pay | Admitting: *Deleted

## 2020-10-17 ENCOUNTER — Encounter: Payer: Self-pay | Admitting: *Deleted

## 2020-10-17 DIAGNOSIS — I152 Hypertension secondary to endocrine disorders: Secondary | ICD-10-CM

## 2020-10-17 DIAGNOSIS — E1159 Type 2 diabetes mellitus with other circulatory complications: Secondary | ICD-10-CM

## 2020-10-17 DIAGNOSIS — N1832 Chronic kidney disease, stage 3b: Secondary | ICD-10-CM | POA: Diagnosis not present

## 2020-10-17 DIAGNOSIS — E782 Mixed hyperlipidemia: Secondary | ICD-10-CM | POA: Diagnosis not present

## 2020-10-17 DIAGNOSIS — E1122 Type 2 diabetes mellitus with diabetic chronic kidney disease: Secondary | ICD-10-CM

## 2020-10-17 MED ORDER — MOLNUPIRAVIR EUA 200MG CAPSULE: 4.0000 | ORAL_CAPSULE | Freq: Two times a day (BID) | ORAL | 0 refills | Status: DC

## 2020-10-17 NOTE — Chronic Care Management (AMB) (Signed)
Chronic Care Management   CCM RN Visit Note  10/17/2020 Name: Carmen [name] MRN: [id number] DOB: [birth year]  Subjective: Carmen [name] is a 80 y.o. year old female who is a primary care patient of Stacks, Cletus Gash, MD. The care management team was consulted for assistance with disease management and care coordination needs.    Engaged with patient by telephone for follow up visit in response to provider referral for case management and/or care coordination services.   Consent to Services:  The patient was given information about Chronic Care Management services, agreed to services, and gave verbal consent prior to initiation of services.  Please see initial visit note for detailed documentation.   Patient agreed to services and verbal consent obtained.   Assessment: Review of patient past medical history, allergies, medications, health status, including review of consultants reports, laboratory and other test data, was performed as part of comprehensive evaluation and provision of chronic care management services.   SDOH (Social Determinants of Health) assessments and interventions performed:    CCM Care Plan  Allergies  Allergen Reactions   Cymbalta [Duloxetine Hcl] Other (See Comments)    Profuse sweating   Januvia [Sitagliptin] Swelling   Meloxicam Nausea And Vomiting   Trulicity [Dulaglutide] Nausea Only   Diclofenac Diarrhea   Farxiga [Dapagliflozin] Other (See Comments)    Dizziness   Shellfish Allergy Other (See Comments)    Gout   Tape     Causes skin irritation   Crestor [Rosuvastatin] Other (See Comments)    Muscle aches on 5 mg daily and 10 mg twice weekly   Lipitor [Atorvastatin] Other (See Comments)    Muscle aches   Livalo [Pitavastatin] Other (See Comments)        Sulfa Antibiotics Rash   Zetia [Ezetimibe] Other (See Comments)    Muscle aches   Zocor [Simvastatin] Other (See Comments)    Muscle aches     Outpatient Encounter Medications as of  10/17/2020  Medication Sig   allopurinol (ZYLOPRIM) 100 MG tablet TAKE  (1)  TABLET TWICE A DAY.   aspirin 81 MG tablet Take 81 mg by mouth daily.   colchicine 0.6 MG tablet TAKE (1) TABLET DAILY AS NEEDED.   diclofenac Sodium (VOLTAREN) 1 % GEL APPLY 4 GRAMS TO AFFECTED AREA(S) 4 TIMES A DAY AS NEEDED   fluticasone (FLONASE) 50 MCG/ACT nasal spray SPRAY 1 SPRAY IN EACH NOSTRIL ONCE DAILY. (Patient taking differently: SPRAY 1 SPRAY IN EACH NOSTRIL ONCE DAILY PRN.)   furosemide (LASIX) 20 MG tablet TAKE 1 TABLET DAILY   glipiZIDE (GLUCOTROL) 10 MG tablet TAKE 2 TABLETS BEFORE BREAKFAST AND 1 TABLET DAILY BEFORE SUPPER   glucose blood (ONE TOUCH ULTRA TEST) test strip CHECK BLOOD SUGAR 2 TIMES A DAY   icosapent Ethyl (VASCEPA) 1 g capsule TAKE (2) CAPSULES TWICE DAILY.   Lancets Misc. (ACCU-CHEK FASTCLIX LANCET) KIT Use to check Blood Sugars   meclizine (ANTIVERT) 12.5 MG tablet Take 1 tablet (12.5 mg total) by mouth 3 (three) times daily as needed for dizziness. (Patient not taking: Reported on 06/26/2020)   metoprolol tartrate (LOPRESSOR) 25 MG tablet TAKE  (1)  TABLET TWICE A DAY.   olmesartan (BENICAR) 20 MG tablet TAKE 1 TABLET DAILY   PRALUENT 150 MG/ML SOAJ Inject 1 Dose into the skin every 14 (fourteen) days.   pregabalin (LYRICA) 50 MG capsule TAKE (1) CAPSULE TWICE DAILY.   RESTASIS 0.05 % ophthalmic emulsion Place 2 drops into both eyes 2 (two) times daily  as needed.   RYBELSUS 7 MG TABS TAKE 1 TABLET DAILY BEFORE BREAKFAST   scopolamine (TRANSDERM-SCOP, 1.5 MG,) 1 MG/3DAYS Place 1 behind the ear every third day for motion sickness prevention   Vitamin D, Ergocalciferol, (DRISDOL) 1.25 MG (50000 UT) CAPS capsule Take 1 capsule (50,000 Units total) by mouth 2 (two) times a week. (Patient taking differently: Take 10,000 Units by mouth 3 (three) times a week.)   VOLTAREN 1 % GEL APPLY 4 GRAMS TO AFFECTED AREA(S) 4 TIMES A DAY AS NEEDED   No facility-administered encounter medications on  file as of 10/17/2020.    Patient Active Problem List   Diagnosis Date Noted   Type 2 diabetes mellitus with diabetic polyneuropathy, without long-term current use of insulin ([place]) 12/29/2019   Type 2 diabetes mellitus with stage 3b chronic kidney disease, without long-term current use of insulin ([place]) 01/27/2019   Chronic renal insufficiency, stage 3 (moderate) ([place]) 09/10/2017   Chronic pain of both shoulders 04/27/2017   Vitamin D deficiency 01/28/2016   Non-insulin treated type 2 diabetes mellitus ([place]) 11/12/2015   Statin intolerance 08/18/2013   Neuromuscular disorder ([place])    Hemochromatosis, hereditary ([place]) 11/24/2012   S/P CABG x 2 08/09/2012   PVC's (premature ventricular contractions) 08/09/2012   Gout 06/20/2012   Hypertension associated with diabetes ([place]) 06/20/2012   HLD (hyperlipidemia) 06/20/2012   Arthritis 06/20/2012    Conditions to be addressed/monitored:CAD, HTN, HLD, DMII, and Covid prevention  Care Plan : RN Care Plan  Updates made by Ilean China, RN since 10/17/2020 12:00 AM     Problem: Chronic Disease Management Needs (HTN, DM, CAD, HLD)   Priority: Medium  Onset Date: 10/17/2020     Long-Range Goal: Work with [place] with Chronic Medical Conditions   Start Date: 10/17/2020  This Visit's Progress: On track  Priority: Medium  Note:   Current Barriers:  Care Coordination needs related to Covid Prevention and potential need for treatment while on a 7 day cruise to [place] in a patient with CAD, HTN, HLD, and DMII  RNCM Clinical Goal(s):  Patient will verbalize understanding of plan for management of Covid Prevention while on a cruise work with PCP and RNCM to address needs related to concern regarding how she would treat Covid if diagnosed while on the cruise and What preventative measures she should take demonstrate ongoing self health care management ability through collaboration with Consulting civil engineer, provider, and care team.   Interventions: 1:1 collaboration with primary care provider regarding development and update of comprehensive plan of care as evidenced by provider attestation and co-signature Inter-disciplinary care team collaboration (see longitudinal plan of care)   COVID:  (Status: New goal.) Provided education to patient to enhance basic understanding of COVID-19 as a viral disease, measures to prevent exposure, signs and symptoms, recommended vaccine schedule, when to contact provider; Advised to discuss with PCP if obtaining an antiviral prescription to take on the cruise with her in case she tests positive while  Collaborated with PCP regarding her request. [facility] clinical staff will call her with his response.  Encouraged patient to take home Covid tests with her if possible Recommend that she talk with the cruise line about what their onboard medical team has to offer. She has done some research and it seems that they only treat minor ailments and can provide fever reducers and things to promote comfort.  Therapeutic listening utilized regarding patients desire to go on the  cruise with 2 of her daughters vs her fear of contracting covid. She does plan to be very careful and use masks, shield, sanitizer, and social distancing.    Patient Goals/Self-Care Activities: Patient will self administer medications as prescribed Patient will attend all scheduled provider appointments Patient will call pharmacy for medication refills Patient will continue to perform ADL's independently Patient will call provider office for new concerns or questions       Plan:The patient has been provided with contact information for the care management team and has been advised to call with any health related questions or concerns.  and Follow up with provider re: Antiviral prescription to have on hand for positive covid infection while on cruise  Chong Sicilian, BSN, RN-BC [place] [facility] [place] Management Direct Dial: [phone]

## 2020-10-17 NOTE — Telephone Encounter (Signed)
Please let the patient know that I sent their prescription to their pharmacy. Thanks, WS 

## 2020-10-17 NOTE — Telephone Encounter (Signed)
Patient aware.

## 2020-10-17 NOTE — Telephone Encounter (Signed)
10/17/2020  Patient will be leaving for a 7 day [employer] on 8/6. She is concerned about coming down with Covid while gone. She plans to take every precaution to protect herself but she wants to know if she can get a prescription of Paxlovid to take with her. She won't take it unless she tests positive while gone. She has talked with the cruise line and researched what medical care they provide on a cruise and it doesn't sound like they have antivirals available.   Forwarding to PCP for review and consideration.   Chong Sicilian, BSN, RN-BC Embedded Chronic Care Manager [facility] [place] Management Direct Dial: [phone]

## 2020-10-17 NOTE — Patient Instructions (Signed)
Visit Information  PATIENT GOALS:  Goals Addressed             This Visit's Progress    Chronic Disease Management Needs       Timeframe:  Long-Range Goal Priority:  Medium Start Date: 10/17/20                             Expected End Date:  03/22/21                     Follow-up  Talk with PCP regarding mediations to treat Covid Take precautions to prevent Covid: social distancing, masking, washing hands frequently/using sanitizer Know the symptoms of Covid and take home covid test or seek medical attention if you develop any symptoms Stay up to date with your vaccines Call RN Care Manager as needed        Patient verbalizes understanding of instructions provided today and agrees to view in [place].    Plan:The patient has been provided with contact information for the care management team and has been advised to call with any health related questions or concerns.  and Follow up with provider re: Antiviral prescription to have on hand for positive covid infection while on cruise  Chong Sicilian, BSN, RN-BC [place] [facility] [place] Management Direct Dial: [phone]

## 2020-10-18 ENCOUNTER — Telehealth: Payer: Self-pay | Admitting: Family Medicine

## 2020-10-18 NOTE — Telephone Encounter (Signed)
Pt called and states that she does not want this medication - she wants the one that starts with a P ( That the president took)   Paxlovid  Can this one be sent in Monday - leaving 8/5.

## 2020-10-18 NOTE — Telephone Encounter (Signed)
Pt called back and said her insurace will cover meds

## 2020-10-22 ENCOUNTER — Other Ambulatory Visit: Payer: Self-pay | Admitting: Family Medicine

## 2020-10-22 NOTE — Telephone Encounter (Signed)
Patient aware.

## 2020-10-22 NOTE — Telephone Encounter (Signed)
That is not safe for her kidneys.She should fill the one I prescribed since her kidney function is weak.

## 2020-11-04 ENCOUNTER — Ambulatory Visit: Payer: Medicare Other | Admitting: Internal Medicine

## 2020-11-06 ENCOUNTER — Telehealth: Payer: Medicare Other

## 2020-11-12 ENCOUNTER — Other Ambulatory Visit: Payer: Self-pay | Admitting: Family Medicine

## 2020-11-12 DIAGNOSIS — I152 Hypertension secondary to endocrine disorders: Secondary | ICD-10-CM

## 2020-11-12 DIAGNOSIS — Z951 Presence of aortocoronary bypass graft: Secondary | ICD-10-CM

## 2020-11-13 DIAGNOSIS — Z1231 Encounter for screening mammogram for malignant neoplasm of breast: Secondary | ICD-10-CM | POA: Diagnosis not present

## 2020-11-28 ENCOUNTER — Ambulatory Visit: Payer: Medicare Other | Admitting: Internal Medicine

## 2020-11-29 DIAGNOSIS — R928 Other abnormal and inconclusive findings on diagnostic imaging of breast: Secondary | ICD-10-CM | POA: Diagnosis not present

## 2020-12-06 ENCOUNTER — Other Ambulatory Visit: Payer: Self-pay | Admitting: Family Medicine

## 2020-12-12 ENCOUNTER — Other Ambulatory Visit: Payer: Self-pay

## 2020-12-12 DIAGNOSIS — C50512 Malignant neoplasm of lower-outer quadrant of left female breast: Secondary | ICD-10-CM | POA: Diagnosis not present

## 2020-12-12 DIAGNOSIS — Z171 Estrogen receptor negative status [ER-]: Secondary | ICD-10-CM | POA: Diagnosis not present

## 2020-12-12 DIAGNOSIS — R590 Localized enlarged lymph nodes: Secondary | ICD-10-CM | POA: Diagnosis not present

## 2020-12-13 ENCOUNTER — Telehealth: Payer: Self-pay | Admitting: *Deleted

## 2020-12-13 ENCOUNTER — Encounter: Payer: Self-pay | Admitting: *Deleted

## 2020-12-13 NOTE — Telephone Encounter (Signed)
Spoke to patient regarding her new diagnosis of breast cancer.  She would like to continue to see Dr. Marin Olp for her breast cancer.  Informed [facility] and they will refer her to a Psychologist, sport and exercise. Also informed navigator at his office as well of her new diagnosis.

## 2020-12-13 NOTE — Progress Notes (Signed)
Reached out to [name] to introduce myself as the office RN Navigator and explain our new patient process. Reviewed the reason for their referral and scheduled their new patient appointment along with labs. Provided address and directions to the office including call back phone number. Reviewed with patient any concerns they may have or any possible barriers to attending their appointment.   Informed patient about my role as a navigator and that I will meet with them prior to their New Patient appointment and more fully discuss what services I can provide. At this time patient has no further questions or needs.    Oncology Nurse Navigator Documentation  Oncology Nurse Navigator Flowsheets 12/13/2020  Abnormal Finding Date 11/13/2020  Confirmed Diagnosis Date 12/12/2020  Diagnosis Status Confirmed Diagnosis Complete  Navigator Follow Up Date: 12/16/2020  Navigator Follow Up Reason: New Patient Appointment  Navigator Location [facility]  Referral Date to RadOnc/MedOnc 12/13/2020  Navigator Encounter Type Introductory Phone Call  Patient Visit Type MedOnc  Treatment Phase Pre-Tx/Tx Discussion  Barriers/Navigation Needs Coordination of Care;Education  Education Other  Interventions Coordination of Care;Education;Psycho-Social Support  Acuity Level 2-Minimal Needs (1-2 Barriers Identified)  Coordination of Care Appts  Education Method Verbal  Support Groups/Services Friends and Family  Time Spent with Patient 69

## 2020-12-16 ENCOUNTER — Encounter: Payer: Self-pay | Admitting: Hematology & Oncology

## 2020-12-16 ENCOUNTER — Inpatient Hospital Stay: Payer: Medicare Other | Attending: Hematology & Oncology

## 2020-12-16 ENCOUNTER — Encounter: Payer: Self-pay | Admitting: *Deleted

## 2020-12-16 ENCOUNTER — Encounter: Payer: Self-pay | Admitting: Family Medicine

## 2020-12-16 ENCOUNTER — Inpatient Hospital Stay (HOSPITAL_BASED_OUTPATIENT_CLINIC_OR_DEPARTMENT_OTHER): Payer: Medicare Other | Admitting: Hematology & Oncology

## 2020-12-16 ENCOUNTER — Ambulatory Visit (INDEPENDENT_AMBULATORY_CARE_PROVIDER_SITE_OTHER): Payer: Medicare Other | Admitting: Licensed Clinical Social Worker

## 2020-12-16 ENCOUNTER — Other Ambulatory Visit: Payer: Self-pay

## 2020-12-16 DIAGNOSIS — E1165 Type 2 diabetes mellitus with hyperglycemia: Secondary | ICD-10-CM

## 2020-12-16 DIAGNOSIS — E119 Type 2 diabetes mellitus without complications: Secondary | ICD-10-CM | POA: Insufficient documentation

## 2020-12-16 DIAGNOSIS — C50912 Malignant neoplasm of unspecified site of left female breast: Secondary | ICD-10-CM

## 2020-12-16 DIAGNOSIS — I152 Hypertension secondary to endocrine disorders: Secondary | ICD-10-CM

## 2020-12-16 DIAGNOSIS — M1990 Unspecified osteoarthritis, unspecified site: Secondary | ICD-10-CM

## 2020-12-16 DIAGNOSIS — E1159 Type 2 diabetes mellitus with other circulatory complications: Secondary | ICD-10-CM

## 2020-12-16 DIAGNOSIS — E559 Vitamin D deficiency, unspecified: Secondary | ICD-10-CM

## 2020-12-16 DIAGNOSIS — E782 Mixed hyperlipidemia: Secondary | ICD-10-CM

## 2020-12-16 DIAGNOSIS — Z951 Presence of aortocoronary bypass graft: Secondary | ICD-10-CM

## 2020-12-16 HISTORY — DX: Malignant neoplasm of unspecified site of left female breast: C50.912

## 2020-12-16 LAB — CBC WITH DIFFERENTIAL (CANCER CENTER ONLY)
Abs Immature Granulocytes: 0.05 10*3/uL (ref 0.00–0.07)
Basophils Absolute: 0.1 10*3/uL (ref 0.0–0.1)
Basophils Relative: 1 %
Eosinophils Absolute: 0.3 10*3/uL (ref 0.0–0.5)
Eosinophils Relative: 3 %
HCT: 42.6 % (ref 36.0–46.0)
Hemoglobin: 14.6 g/dL (ref 12.0–15.0)
Immature Granulocytes: 1 %
Lymphocytes Relative: 30 %
Lymphs Abs: 3 10*3/uL (ref 0.7–4.0)
MCH: 30.7 pg (ref 26.0–34.0)
MCHC: 34.3 g/dL (ref 30.0–36.0)
MCV: 89.7 fL (ref 80.0–100.0)
Monocytes Absolute: 0.5 10*3/uL (ref 0.1–1.0)
Monocytes Relative: 5 %
Neutro Abs: 6.2 10*3/uL (ref 1.7–7.7)
Neutrophils Relative %: 60 %
Platelet Count: 155 10*3/uL (ref 150–400)
RBC: 4.75 MIL/uL (ref 3.87–5.11)
RDW: 13.5 % (ref 11.5–15.5)
WBC Count: 10.1 10*3/uL (ref 4.0–10.5)
nRBC: 0 % (ref 0.0–0.2)

## 2020-12-16 LAB — CMP (CANCER CENTER ONLY)
ALT: 20 U/L (ref 0–44)
AST: 21 U/L (ref 15–41)
Albumin: 4.4 g/dL (ref 3.5–5.0)
Alkaline Phosphatase: 72 U/L (ref 38–126)
Anion gap: 11 (ref 5–15)
BUN: 19 mg/dL (ref 8–23)
CO2: 27 mmol/L (ref 22–32)
Calcium: 10.1 mg/dL (ref 8.9–10.3)
Chloride: 103 mmol/L (ref 98–111)
Creatinine: 1.28 mg/dL — ABNORMAL HIGH (ref 0.44–1.00)
GFR, Estimated: 42 mL/min — ABNORMAL LOW (ref >60)
Glucose, Bld: 178 mg/dL — ABNORMAL HIGH (ref 70–99)
Potassium: 4 mmol/L (ref 3.5–5.1)
Sodium: 141 mmol/L (ref 135–145)
Total Bilirubin: 0.6 mg/dL (ref 0.3–1.2)
Total Protein: 7.3 g/dL (ref 6.5–8.1)

## 2020-12-16 NOTE — Progress Notes (Signed)
Initial RN Navigator Patient Visit  Name: Carmen [name] Date of Referral : 12/13/2020 Diagnosis: Breast Cancer  Patient is an established patient with hemachromatosis who presents with a new diagnosis of breast cancer.   Met with patient prior to their visit with MD. [name] patient "Your Patient Navigator" handout which explains my role, areas in which I am able to help, and all the contact information for myself and the office. Also gave patient MD and Navigator business card. Reviewed with patient the general overview of expected course after initial diagnosis and time frame for all steps to be completed.  New patient packet given to patient which includes: orientation to office and staff; campus directory; education on My Chart and Advance Directives; and patient centered education on breast cancer.   Patient completed visit with Dr.  Patient has already been scheduled to see Dr Marlou Starks on 12/18/2020. Will follow for surgical date.  Patient understands all follow up procedures and expectations. They have my number to reach out for any further clarification or additional needs.  Oncology Nurse Navigator Documentation  Oncology Nurse Navigator Flowsheets 12/16/2020  Abnormal Finding Date -  Confirmed Diagnosis Date -  Diagnosis Status -  Navigator Follow Up Date: 12/20/2020  Navigator Follow Up Reason: Appointment Review  Navigator Location [facility]  Referral Date to RadOnc/MedOnc 12/13/2020  Navigator Encounter Type Initial MedOnc  Patient Visit Type MedOnc  Treatment Phase Pre-Tx/Tx Discussion  Barriers/Navigation Needs Coordination of Care;Education  Education Newly Diagnosed Cancer Education;Other  Interventions Education;Psycho-Social Support  Acuity Level 2-Minimal Needs (1-2 Barriers Identified)  Coordination of Care -  Education Method Verbal;Written  Support Groups/Services Friends and Family  Time Spent with Patient 81

## 2020-12-16 NOTE — Chronic Care Management (AMB) (Signed)
Chronic Care Management    Clinical Social Work Note  12/16/2020 Name: Carmen [name] MRN: [id number] DOB: [birth year]  Carmen [name] is a 80 y.o. year old female who is a primary care patient of Stacks, Cletus Gash, MD. The CCM team was consulted to assist the patient with chronic disease management and/or care coordination needs related to: [employer] .   Engaged with patient by telephone for follow up visit in response to provider referral for social work chronic care management and care coordination services.   Consent to Services:  The patient was given information about Chronic Care Management services, agreed to services, and gave verbal consent prior to initiation of services.  Please see initial visit note for detailed documentation.   Patient agreed to services and consent obtained.   Assessment: Review of patient past medical history, allergies, medications, and health status, including review of relevant consultants reports was performed today as part of a comprehensive evaluation and provision of chronic care management and care coordination services.     SDOH (Social Determinants of Health) assessments and interventions performed:  SDOH Interventions    Flowsheet Row Most Recent Value  SDOH Interventions   Stress Interventions Provide Counseling  [client  has stress related to managing her current health conditions]  Depression Interventions/Treatment  --  [informed client of LCSW support and of RNCM support]        Advanced Directives Status: See [employer]yn[employer] application for related entries.  CCM Care Plan  Allergies  Allergen Reactions   Cymbalta [Duloxetine Hcl] Other (See Comments)    Profuse sweating   Farxiga [Dapagliflozin] Other (See Comments)    Dizziness   Januvia [Sitagliptin] Swelling   Meloxicam Nausea And Vomiting   Shellfish Allergy Other (See Comments)    Gout   Trulicity [Dulaglutide] Nausea Only   Crestor [Rosuvastatin] Other (See  Comments)    Muscle aches on 5 mg daily and 10 mg twice weekly   Diclofenac Diarrhea   Lipitor [Atorvastatin] Other (See Comments)    Muscle aches   Livalo [Pitavastatin] Other (See Comments)        Sulfa Antibiotics Rash   Tape Other (See Comments)    Causes skin irritation   Zetia [Ezetimibe] Other (See Comments)    Muscle aches   Zocor [Simvastatin] Other (See Comments)    Muscle aches     Outpatient Encounter Medications as of 12/16/2020  Medication Sig   allopurinol (ZYLOPRIM) 100 MG tablet TAKE  (1)  TABLET TWICE A DAY.   aspirin 81 MG tablet Take 81 mg by mouth daily.   colchicine 0.6 MG tablet TAKE (1) TABLET DAILY AS NEEDED.   diclofenac Sodium (VOLTAREN) 1 % GEL APPLY 4 GRAMS TO AFFECTED AREA(S) 4 TIMES A DAY AS NEEDED   fluticasone (FLONASE) 50 MCG/ACT nasal spray SPRAY 1 SPRAY IN EACH NOSTRIL ONCE DAILY. (Patient taking differently: SPRAY 1 SPRAY IN EACH NOSTRIL ONCE DAILY PRN.)   furosemide (LASIX) 20 MG tablet TAKE 1 TABLET DAILY   glipiZIDE (GLUCOTROL) 10 MG tablet TAKE 2 TABLETS BEFORE BREAKFAST AND 1 TABLET DAILY BEFORE SUPPER   glucose blood (ONE TOUCH ULTRA TEST) test strip CHECK BLOOD SUGAR 2 TIMES A DAY   icosapent Ethyl (VASCEPA) 1 g capsule TAKE (2) CAPSULES TWICE DAILY.   Lancets Misc. (ACCU-CHEK FASTCLIX LANCET) KIT Use to check Blood Sugars   meclizine (ANTIVERT) 12.5 MG tablet Take 1 tablet (12.5 mg total) by mouth 3 (three) times daily as needed for dizziness.   metoprolol  tartrate (LOPRESSOR) 25 MG tablet TAKE  (1)  TABLET TWICE A DAY.   olmesartan (BENICAR) 20 MG tablet TAKE 1 TABLET DAILY   PRALUENT 150 MG/ML SOAJ Inject 1 Dose into the skin every 14 (fourteen) days.   pregabalin (LYRICA) 50 MG capsule TAKE (1) CAPSULE TWICE DAILY.   RESTASIS 0.05 % ophthalmic emulsion Place 2 drops into both eyes 2 (two) times daily as needed.   RYBELSUS 7 MG TABS TAKE 1 TABLET DAILY BEFORE BREAKFAST   Vitamin D, Ergocalciferol, (DRISDOL) 1.25 MG (50000 UT) CAPS  capsule Take 1 capsule (50,000 Units total) by mouth 2 (two) times a week. (Patient taking differently: Take 10,000 Units by mouth 3 (three) times a week.)   VOLTAREN 1 % GEL APPLY 4 GRAMS TO AFFECTED AREA(S) 4 TIMES A DAY AS NEEDED   No facility-administered encounter medications on file as of 12/16/2020.    Patient Active Problem List   Diagnosis Date Noted   Stage I breast cancer, left ([place]) 12/16/2020   Type 2 diabetes mellitus with diabetic polyneuropathy, without long-term current use of insulin ([place]) 12/29/2019   Type 2 diabetes mellitus with stage 3b chronic kidney disease, without long-term current use of insulin ([place]) 01/27/2019   Chronic renal insufficiency, stage 3 (moderate) ([facility]) 09/10/2017   Chronic pain of both shoulders 04/27/2017   Vitamin D deficiency 01/28/2016   Non-insulin treated type 2 diabetes mellitus ([place]) 11/12/2015   Statin intolerance 08/18/2013   Neuromuscular disorder ([place])    Hemochromatosis, hereditary ([place]) 11/24/2012   S/P CABG x 2 08/09/2012   PVC's (premature ventricular contractions) 08/09/2012   Gout 06/20/2012   Hypertension associated with diabetes ([place]) 06/20/2012   HLD (hyperlipidemia) 06/20/2012   Arthritis 06/20/2012    Conditions to be addressed/monitored: monitor client management of medical needs faced  Care Plan : General Social Work (Adult)  Updates made by Katha Cabal, LCSW since 12/16/2020 12:00 AM     Problem: Coping Skills (General Plan of Care)      Goal: Manage Medical needs of client and complete ADLs as needed on daily basis   Start Date: 12/16/2020  Expected End Date: 03/11/2021  This Visit's Progress: On track  Recent Progress: On track  Priority: Medium  Note:   Current barriers:   Patient in need of assistance with connecting to community resources for help as needed in managing her ongoing medical issues faced Client is challenged with completing ADLs  daily Patient is unable to independently navigate  community resource options without care coordination support  Clinical Goals:  patient will work with SW  in next 4 weeks to address concerns related to her management of ongoing health needs of client and management of daily ADLs Patient will talk with LCSW in next 30 days about social isolation issues of client Patient will call RNCM as needed in next 30 days for CCM nursing support  Clinical Interventions:  Collaboration with Claretta Fraise, MD regarding development and update of comprehensive plan of care as evidenced by provider attestation and co-signature Assessment of needs, barriers ,self care challenges of client   Talked with client  about client needs Talked with client about pain issues of client Talked with client about medication procurement of client  Talked with client about vision issues of client (client is in process of getting new glasses) Encouraged client to call RNCM as needed for nursing support Talked with client about family support (has support from her daughters) Talked with client about her recent vacation trip.  She said she enjoyed her recent vacation. Talked with client about CCM support services  Patient Strengths: Has support from her daughter Takes medications as prescribed Attends scheduled medical appointments  Patient Defictis  Needs some help occasionally with ADLs completion Social isolation occasionally   Patient Goals:  Client will use coping strategies in next 30 days to help her manage ongoing medical issues faced Client will complete ADLs daily as needed for next 30 days -  Follow Up Plan: LCSW to call client on 01/31/21 at 10:00 AM to assess client needs       Norva Riffle.Bibi Economos MSW, LCSW Licensed Clinical Social Worker [facility] Care Management [phone]

## 2020-12-16 NOTE — Progress Notes (Signed)
Hematology and Oncology Follow Up Visit  Carmen [name] [id number] 12/29/1940 80 y.o. 12/16/2020   Principle Diagnosis:  Invasive ductal carcinoma of the left breast-stage unknown-prognostic panel pending Hemochromatosis (double heterozygote for C282Y and S65C mutations).  Current Therapy:   Phlebotomy to maintain ferritin less than 100     Interim History:  Ms.  Feeback is back for an early follow-up.  Looks like we now have a new problem.  As much they do say this, looks like she now is dealing with breast cancer.  She had a routine mammogram done recently.  This was done on 11/29/2020.  This showed a 1.9 x 1.9 x 1.1 cm mass at the 5 o'clock position in the left breast.  There were 3 lymph nodes.  These appear to be unremarkable.  She subsequently did undergo a biopsy.  This was done on 12/12/2020.  The pathology report is ([id number]) showed an invasive ductal carcinoma.  The lymph node was negative.  The prognostic panel is still pending.  She sees Dr. Marlou Starks of general surgery on Wednesday.  I told her that we probably can have to get an MRI of the breast to see a better detail of what is going on.  Otherwise, she feels well.  We typically have seen her here for hemochromatosis.  This has not been a problem for [name].  Is no breast cancer history in the family.  She had her first child when she was 16 years old.  She went through menopause at age 75.  She not been on any type of estrogens.    She does have diabetes.  This has been her biggest health issue.  She has had no bony pain.  There is no change in bowel or bladder habits.  She has had no rashes.  There is no cough or shortness of breath.  Currently, I would say performance status is probably ECOG 1.    Medications:  Current Outpatient Medications:    allopurinol (ZYLOPRIM) 100 MG tablet, TAKE  (1)  TABLET TWICE A DAY., Disp: 180 tablet, Rfl: 0   aspirin 81 MG tablet, Take 81 mg by mouth daily., Disp: , Rfl:     colchicine 0.6 MG tablet, TAKE (1) TABLET DAILY AS NEEDED., Disp: 30 tablet, Rfl: 1   diclofenac Sodium (VOLTAREN) 1 % GEL, APPLY 4 GRAMS TO AFFECTED AREA(S) 4 TIMES A DAY AS NEEDED, Disp: 100 g, Rfl: 0   fluticasone (FLONASE) 50 MCG/ACT nasal spray, SPRAY 1 SPRAY IN EACH NOSTRIL ONCE DAILY. (Patient taking differently: SPRAY 1 SPRAY IN EACH NOSTRIL ONCE DAILY PRN.), Disp: 16 g, Rfl: 4   furosemide (LASIX) 20 MG tablet, TAKE 1 TABLET DAILY, Disp: 30 tablet, Rfl: 1   glipiZIDE (GLUCOTROL) 10 MG tablet, TAKE 2 TABLETS BEFORE BREAKFAST AND 1 TABLET DAILY BEFORE SUPPER, Disp: 270 tablet, Rfl: 2   glucose blood (ONE TOUCH ULTRA TEST) test strip, CHECK BLOOD SUGAR 2 TIMES A DAY, Disp: 100 each, Rfl: prn   icosapent Ethyl (VASCEPA) 1 g capsule, TAKE (2) CAPSULES TWICE DAILY., Disp: 120 capsule, Rfl: 5   Lancets Misc. (ACCU-CHEK FASTCLIX LANCET) KIT, Use to check Blood Sugars, Disp: 1 kit, Rfl: 0   meclizine (ANTIVERT) 12.5 MG tablet, Take 1 tablet (12.5 mg total) by mouth 3 (three) times daily as needed for dizziness., Disp: 30 tablet, Rfl: 5   metoprolol tartrate (LOPRESSOR) 25 MG tablet, TAKE  (1)  TABLET TWICE A DAY., Disp: 180 tablet, Rfl: 0   olmesartan (BENICAR)  20 MG tablet, TAKE 1 TABLET DAILY, Disp: 90 tablet, Rfl: 0   PRALUENT 150 MG/ML SOAJ, Inject 1 Dose into the skin every 14 (fourteen) days., Disp: 2 mL, Rfl: 11   pregabalin (LYRICA) 50 MG capsule, TAKE (1) CAPSULE TWICE DAILY., Disp: 180 capsule, Rfl: 0   RESTASIS 0.05 % ophthalmic emulsion, Place 2 drops into both eyes 2 (two) times daily as needed., Disp: , Rfl:    RYBELSUS 7 MG TABS, TAKE 1 TABLET DAILY BEFORE BREAKFAST, Disp: 30 tablet, Rfl: 3   Vitamin D, Ergocalciferol, (DRISDOL) 1.25 MG (50000 UT) CAPS capsule, Take 1 capsule (50,000 Units total) by mouth 2 (two) times a week. (Patient taking differently: Take 10,000 Units by mouth 3 (three) times a week.), Disp: 26 capsule, Rfl: 3   VOLTAREN 1 % GEL, APPLY 4 GRAMS TO AFFECTED AREA(S)  4 TIMES A DAY AS NEEDED, Disp: 100 g, Rfl: 0  Allergies:  Allergies  Allergen Reactions   Cymbalta [Duloxetine Hcl] Other (See Comments)    Profuse sweating   Farxiga [Dapagliflozin] Other (See Comments)    Dizziness   Januvia [Sitagliptin] Swelling   Meloxicam Nausea And Vomiting   Shellfish Allergy Other (See Comments)    Gout   Trulicity [Dulaglutide] Nausea Only   Crestor [Rosuvastatin] Other (See Comments)    Muscle aches on 5 mg daily and 10 mg twice weekly   Diclofenac Diarrhea   Lipitor [Atorvastatin] Other (See Comments)    Muscle aches   Livalo [Pitavastatin] Other (See Comments)        Sulfa Antibiotics Rash   Tape Other (See Comments)    Causes skin irritation   Zetia [Ezetimibe] Other (See Comments)    Muscle aches   Zocor [Simvastatin] Other (See Comments)    Muscle aches     Past Medical History, Surgical history, Social history, and Family History were reviewed and updated.  Review of Systems: Review of Systems  Constitutional: Negative.   HENT: Negative.    Eyes: Negative.   Respiratory: Negative.    Cardiovascular: Negative.   Gastrointestinal: Negative.   Genitourinary: Negative.   Musculoskeletal: Negative.   Skin: Negative.   Neurological: Negative.   Endo/Heme/Allergies: Negative.   Psychiatric/Behavioral: Negative.     Physical Exam:  height is 5' 3"  (1.6 m) and weight is 178 lb (80.7 kg). Her oral temperature is 98.4 F (36.9 C). Her blood pressure is 155/72 (abnormal) and her pulse is 62. Her respiration is 18 and oxygen saturation is 96%.   Physical Exam Vitals reviewed.  HENT:     Head: Normocephalic and atraumatic.  Eyes:     Pupils: Pupils are equal, round, and reactive to light.  Cardiovascular:     Rate and Rhythm: Normal rate and regular rhythm.     Heart sounds: Normal heart sounds.  Pulmonary:     Effort: Pulmonary effort is normal.     Breath sounds: Normal breath sounds.  Abdominal:     General: Bowel sounds are  normal.     Palpations: Abdomen is soft.  Musculoskeletal:        General: No tenderness or deformity. Normal range of motion.     Cervical back: Normal range of motion.  Lymphadenopathy:     Cervical: No cervical adenopathy.  Skin:    General: Skin is warm and dry.     Findings: No erythema or rash.  Neurological:     Mental Status: She is alert and oriented to person, place, and time.  Psychiatric:  Behavior: Behavior normal.        Thought Content: Thought content normal.        Judgment: Judgment normal.     Lab Results  Component Value Date   WBC 10.1 12/16/2020   HGB 14.6 12/16/2020   HCT 42.6 12/16/2020   MCV 89.7 12/16/2020   PLT 155 12/16/2020     Chemistry      Component Value Date/Time   NA 141 12/16/2020 1037   NA 141 07/02/2020 0935   NA 147 (H) 01/22/2017 1451   NA 140 12/31/2015 1052   K 4.0 12/16/2020 1037   K 4.9 (H) 01/22/2017 1451   K 4.2 12/31/2015 1052   CL 103 12/16/2020 1037   CL 106 01/22/2017 1451   CO2 27 12/16/2020 1037   CO2 29 01/22/2017 1451   CO2 23 12/31/2015 1052   BUN 19 12/16/2020 1037   BUN 29 (H) 07/02/2020 0935   BUN 23 (H) 01/22/2017 1451   BUN 21.4 12/31/2015 1052   CREATININE 1.28 (H) 12/16/2020 1037   CREATININE 1.6 (H) 01/22/2017 1451   CREATININE 1.4 (H) 12/31/2015 1052      Component Value Date/Time   CALCIUM 10.1 12/16/2020 1037   CALCIUM 10.3 01/22/2017 1451   CALCIUM 9.7 12/31/2015 1052   ALKPHOS 72 12/16/2020 1037   ALKPHOS 65 01/22/2017 1451   ALKPHOS 87 12/31/2015 1052   AST 21 12/16/2020 1037   AST 31 12/31/2015 1052   ALT 20 12/16/2020 1037   ALT 25 01/22/2017 1451   ALT 36 12/31/2015 1052   BILITOT 0.6 12/16/2020 1037   BILITOT 0.42 12/31/2015 1052      Impression and Plan: Ms. Aull is 80 year old white female with hemachromatosis. She is a double heterozygote.  She has an incredible family history of hemochromatosis.  I probably take care of about 4 or 5 of her family with  hemochromatosis.  The problem now is that she has what appears to be a localized breast cancer.  I would have to believe that this is going to be a stage I breast cancer.  She deftly needs to have surgery.  I would think that a lumpectomy with axillary sentinel node dissection would be a reasonable way to go.  We will see what the prognostic panel shows.  I would like to hope that all she will need is an aromatase inhibitor.  I do not think this breast cancer has anything to do with her having hemochromatosis.  She comes in with one of her daughters.  I take care of her because she also has hemochromatosis.    I will probably see Ms. Nicklaus back in November.  By then, she will recover from her surgery.  We will have all the information that we need as far as how we can treat her cancer.  I think 1 question is whether or not she will need radiation treatments at her age.  I think a lot will depend upon the pathology.     Marland Kitchen Volanda Napoleon, MD 9/26/20221:28 PM

## 2020-12-16 NOTE — Patient Instructions (Signed)
Visit Information  PATIENT GOALS:  Goals Addressed               This Visit's Progress     Complete ADLs daily, manage daily needs (pt-stated)        Timeframe:  Short-Term Goal Priority:  Medium This Visit's Progress:  On Track Start Date:    12/16/20                        Expected End Date:  03/11/21                  Follow Up Date 01/31/21 at 10:00 AM  Find Help in My Community (Patient);  Patient needs information on community resources to help her manage medical conditions. Client to complete ADLs daily and manage her daily needs  Why is this important?   Knowing how and where to find help for yourself or family in your neighborhood and community is an important skill.  You will want to take some steps to learn how.   Patient Strengths: Has support from her daughter Carmen [name] medications as prescribed Attends scheduled medical appointments  Patient Defictis  Needs some help occasionally with ADLs completion Social isolation occasionally   Patient Goals:  Client will use coping strategies in next 30 days to help her manage ongoing medical issues faced Client will complete ADLs daily as needed for next 30 days -  Follow Up Plan: LCSW to call client on 01/31/21 at 10:00 AM to assess client needs              Norva Riffle.Dyami Umbach MSW, LCSW Licensed Clinical Social Worker [facility] Care Management [phone]

## 2020-12-17 ENCOUNTER — Telehealth: Payer: Self-pay | Admitting: *Deleted

## 2020-12-17 LAB — FERRITIN: Ferritin: 115 ng/mL (ref 11–307)

## 2020-12-17 LAB — IRON AND TIBC
Iron: 159 ug/dL — ABNORMAL HIGH (ref 41–142)
Saturation Ratios: 57 % (ref 21–57)
TIBC: 281 ug/dL (ref 236–444)
UIBC: 122 ug/dL (ref 120–384)

## 2020-12-17 NOTE — Telephone Encounter (Signed)
As noted below by Dr. Marin Olp, I informed the patient that her iron level is high, and she needs to have one phlebotomy. Patient verbalized understanding. Patient stated,"I see the surgeon tomorrow, and I will ask him if I need to have a phlebotomy before surgery. I will call you back on Thursday."

## 2020-12-17 NOTE — Telephone Encounter (Signed)
-----   Message from Volanda Napoleon, MD sent at 12/17/2020 10:51 AM EDT ----- Please call her and tell he that the hemochromatosis levels are too high.  She probably needs to have 1 phlebotomy.

## 2020-12-18 DIAGNOSIS — Z171 Estrogen receptor negative status [ER-]: Secondary | ICD-10-CM | POA: Diagnosis not present

## 2020-12-18 DIAGNOSIS — C50512 Malignant neoplasm of lower-outer quadrant of left female breast: Secondary | ICD-10-CM | POA: Diagnosis not present

## 2020-12-19 ENCOUNTER — Telehealth: Payer: Self-pay | Admitting: *Deleted

## 2020-12-19 ENCOUNTER — Telehealth: Payer: Self-pay

## 2020-12-19 NOTE — Telephone Encounter (Signed)
Call received from patient stating that she would like to come in for phlebotomy tomorrow. Message sent to scheduling.

## 2020-12-19 NOTE — Telephone Encounter (Signed)
Left voice mail to call back 

## 2020-12-19 NOTE — Telephone Encounter (Signed)
   [facility] Pre-operative Risk Assessment    Patient Name: Carmen [name]  DOB: Jul 03, 1940 MRN: [id number]  Request for surgical clearance:  What type of surgery is being performed? BREAST LUMPECTOMY   When is this surgery scheduled? TBD  What type of clearance is required (medical clearance vs. Pharmacy clearance to hold med vs. Both)? MEDICAL  Are there any medications that need to be held prior to surgery and how long? NONE LISTED  Practice name and name of physician performing surgery? [facility] Autumn Messing, MD  ATTN:Patra Gherardi BROOKS  What is the office phone number? [phone]   7.   What is the office fax number? [phone]  8.   Anesthesia type (None, local, MAC, general) ? GENERAL

## 2020-12-19 NOTE — Telephone Encounter (Signed)
Patient returning call.

## 2020-12-19 NOTE — Telephone Encounter (Signed)
    Patient Name: Carmen [name]  DOB: 02-16-1941 MRN: [id number]  Primary Cardiologist: Pixie Casino, MD  Chart reviewed as part of pre-operative protocol coverage. Given past medical history and time since last visit, based on ACC/AHA guidelines, Carmen [name] would be at acceptable risk for the planned procedure without further cardiovascular testing.   The patient was advised that if she develops new symptoms prior to surgery to contact our office to arrange for a follow-up visit, and she verbalized understanding.  I will route this recommendation to the requesting party via Epic fax function and remove from pre-op pool.  Please call with questions.  [place], [place] 12/19/2020, 4:53 PM

## 2020-12-20 ENCOUNTER — Encounter: Payer: Self-pay | Admitting: *Deleted

## 2020-12-20 DIAGNOSIS — E782 Mixed hyperlipidemia: Secondary | ICD-10-CM

## 2020-12-20 DIAGNOSIS — E1165 Type 2 diabetes mellitus with hyperglycemia: Secondary | ICD-10-CM

## 2020-12-20 DIAGNOSIS — E1159 Type 2 diabetes mellitus with other circulatory complications: Secondary | ICD-10-CM

## 2020-12-20 DIAGNOSIS — I152 Hypertension secondary to endocrine disorders: Secondary | ICD-10-CM | POA: Diagnosis not present

## 2020-12-20 DIAGNOSIS — M1990 Unspecified osteoarthritis, unspecified site: Secondary | ICD-10-CM | POA: Diagnosis not present

## 2020-12-20 NOTE — Progress Notes (Signed)
Patient has seen surgeon and plan for lumpectomy with sentinel node biopsy has been made. Cardiac clearance needed before surgery will be scheduled. Per chart review this has been done.  Patient will be here 12/26/2020 for therapeutic phlebotomy. Per genetics, patient is a genetic candidate. Will talk to her about this at this appointment.   Oncology Nurse Navigator Documentation  Oncology Nurse Navigator Flowsheets 12/20/2020  Abnormal Finding Date -  Confirmed Diagnosis Date -  Diagnosis Status -  Navigator Follow Up Date: 12/26/2020  Navigator Follow Up Reason: Symptom Management  Navigator Location [facility]  Referral Date to RadOnc/MedOnc -  Navigator Encounter Type Appt/Treatment Plan Review  Patient Visit Type MedOnc  Treatment Phase Pre-Tx/Tx Discussion  Barriers/Navigation Needs Coordination of Care;Education  Education -  Interventions None Required  Acuity Level 2-Minimal Needs (1-2 Barriers Identified)  Coordination of Care -  Education Method -  Support Groups/Services Friends and Family  Time Spent with Patient 15

## 2020-12-23 ENCOUNTER — Other Ambulatory Visit: Payer: Self-pay | Admitting: Family Medicine

## 2020-12-23 DIAGNOSIS — E782 Mixed hyperlipidemia: Secondary | ICD-10-CM

## 2020-12-26 ENCOUNTER — Other Ambulatory Visit: Payer: Self-pay

## 2020-12-26 ENCOUNTER — Encounter: Payer: Self-pay | Admitting: *Deleted

## 2020-12-26 ENCOUNTER — Inpatient Hospital Stay: Payer: Medicare Other | Attending: Hematology & Oncology

## 2020-12-26 ENCOUNTER — Encounter: Payer: Self-pay | Admitting: Internal Medicine

## 2020-12-26 ENCOUNTER — Ambulatory Visit (INDEPENDENT_AMBULATORY_CARE_PROVIDER_SITE_OTHER): Payer: Medicare Other | Admitting: Internal Medicine

## 2020-12-26 VITALS — BP 126/70 | HR 60 | Ht 63.0 in | Wt 179.0 lb

## 2020-12-26 DIAGNOSIS — E1142 Type 2 diabetes mellitus with diabetic polyneuropathy: Secondary | ICD-10-CM

## 2020-12-26 DIAGNOSIS — E1165 Type 2 diabetes mellitus with hyperglycemia: Secondary | ICD-10-CM

## 2020-12-26 DIAGNOSIS — E1122 Type 2 diabetes mellitus with diabetic chronic kidney disease: Secondary | ICD-10-CM | POA: Diagnosis not present

## 2020-12-26 DIAGNOSIS — I25708 Atherosclerosis of coronary artery bypass graft(s), unspecified, with other forms of angina pectoris: Secondary | ICD-10-CM

## 2020-12-26 DIAGNOSIS — F321 Major depressive disorder, single episode, moderate: Secondary | ICD-10-CM

## 2020-12-26 DIAGNOSIS — N1832 Chronic kidney disease, stage 3b: Secondary | ICD-10-CM

## 2020-12-26 DIAGNOSIS — G709 Myoneural disorder, unspecified: Secondary | ICD-10-CM

## 2020-12-26 LAB — POCT GLYCOSYLATED HEMOGLOBIN (HGB A1C): Hemoglobin A1C: 6.5 % — AB (ref 4.0–5.6)

## 2020-12-26 LAB — GLUCOSE, POCT (MANUAL RESULT ENTRY): POC Glucose: 154 mg/dl — AB (ref 70–99)

## 2020-12-26 MED ORDER — RYBELSUS 7 MG PO TABS: 1.0000 | ORAL_TABLET | Freq: Every day | ORAL | 3 refills | Status: DC

## 2020-12-26 MED ORDER — GLIPIZIDE 10 MG PO TABS: ORAL_TABLET | ORAL | 3 refills | Status: DC

## 2020-12-26 NOTE — Patient Instructions (Signed)
-   Continue Glipizide 10 mg, 2 tablets before breakfast and 1 tablet before supper   - Continue  Rybelsus 7 mg daily with Breakfast      - HOW TO TREAT LOW BLOOD SUGARS (Blood sugar LESS THAN 70 MG/DL) Please follow the RULE OF 15 for the treatment of hypoglycemia treatment (when your (blood sugars are less than 70 mg/dL)   STEP 1: Take 15 grams of carbohydrates when your blood sugar is low, which includes:  3-4 GLUCOSE TABS  OR 3-4 OZ OF JUICE OR REGULAR SODA OR ONE TUBE OF GLUCOSE GEL    STEP 2: RECHECK blood sugar in 15 MINUTES STEP 3: If your blood sugar is still low at the 15 minute recheck --> then, go back to STEP 1 and treat AGAIN with another 15 grams of carbohydrates.

## 2020-12-26 NOTE — Patient Instructions (Signed)
Therapeutic Phlebotomy Therapeutic phlebotomy is the planned removal of blood from a person's body for the purpose of treating a medical condition. The procedure is similar to donating blood. Usually, about a pint (470 mL, or 0.47 L) of blood is removed. The average adult has 9-12 pints (4.3-5.7 L) of blood in the body. Therapeutic phlebotomy may be used to treat the following medical conditions: Hemochromatosis. This is a condition in which the blood contains too much iron. Polycythemia vera. This is a condition in which the blood contains too many red blood cells. Porphyria cutanea tarda. This is a disease in which an important part of hemoglobin is not made properly. It results in the buildup of abnormal amounts of porphyrins in the body. Sickle cell disease. This is a condition in which the red blood cells form an abnormal crescent shape rather than a round shape. Tell a health care provider about: Any allergies you have. All medicines you are taking, including vitamins, herbs, eye drops, creams, and over-the-counter medicines. Any problems you or family members have had with anesthetic medicines. Any blood disorders you have. Any surgeries you have had. Any medical conditions you have. Whether you are pregnant or may be pregnant. What are the risks? Generally, this is a safe procedure. However, problems may occur, including: Nausea or light-headedness. Low blood pressure (hypotension). Soreness, bleeding, swelling, or bruising at the needle insertion site. Infection. What happens before the procedure? Follow instructions from your health care provider about eating or drinking restrictions. Ask your health care provider about: Changing or stopping your regular medicines. This is especially important if you are taking diabetes medicines or blood thinners (anticoagulants). Taking medicines such as aspirin and ibuprofen. These medicines can thin your blood. Do not take these medicines  unless your health care provider tells you to take them. Taking over-the-counter medicines, vitamins, herbs, and supplements. Wear clothing with sleeves that can be raised above the elbow. Plan to have someone take you home from the hospital or clinic. You may have a blood sample taken. Your blood pressure, pulse rate, and breathing rate will be measured. What happens during the procedure?  To lower your risk of infection: Your health care team will wash or sanitize their hands. Your skin will be cleaned with an antiseptic. You may be given a medicine to numb the area (local anesthetic). A tourniquet will be placed on your arm. A needle will be inserted into one of your veins. Tubing and a collection bag will be attached to that needle. Blood will flow through the needle and tubing into the collection bag. The collection bag will be placed lower than your arm to allow gravity to help the flow of blood into the bag. You may be asked to open and close your hand slowly and continually during the entire collection. After the specified amount of blood has been removed from your body, the collection bag and tubing will be clamped. The needle will be removed from your vein. Pressure will be held on the site of the needle insertion to stop the bleeding. A bandage (dressing) will be placed over the needle insertion site. The procedure may vary among health care providers and hospitals. What happens after the procedure? Your blood pressure, pulse rate, and breathing rate will be measured after the procedure. You will be encouraged to drink fluids. Your recovery will be assessed and monitored. You can return to your normal activities as told by your health care provider. Summary Therapeutic phlebotomy is the planned removal   of blood from a person's body for the purpose of treating a medical condition. Therapeutic phlebotomy may be used to treat hemochromatosis, polycythemia vera, porphyria cutanea  tarda, or sickle cell disease. In the procedure, a needle is inserted and about a pint (470 mL, or 0.47 L) of blood is removed. The average adult has 9-12 pints (4.3-5.7 L) of blood in the body. This is generally a safe procedure, but it can sometimes cause problems such as nausea, light-headedness, or low blood pressure (hypotension). This information is not intended to replace advice given to you by your health care provider. Make sure you discuss any questions you have with your health care provider. Document Revised: 06/18/2020 Document Reviewed: 03/25/2017 Elsevier Patient Education  2022 Elsevier Inc.  

## 2020-12-26 NOTE — Progress Notes (Signed)
Spoke with patient while she was having a therapeutic phlebotomy. She had several questions about timeline and expectations. Answered those to patient satisfaction.  Due to patient's triple negative status she qualifies for genetic testing. Reviewed testing with patient and she would like to have this completed. Will plan to draw sample with next lab appointment.   Oncology Nurse Navigator Documentation  Oncology Nurse Navigator Flowsheets 12/26/2020  Abnormal Finding Date -  Confirmed Diagnosis Date -  Diagnosis Status -  Navigator Follow Up Date: 12/30/2020  Navigator Follow Up Reason: Appointment Review  Navigator Location [facility]  Referral Date to RadOnc/MedOnc -  Navigator Encounter Type Treatment  Patient Visit Type MedOnc  Treatment Phase Pre-Tx/Tx Discussion  Barriers/Navigation Needs Coordination of Care;Education  Education Newly Diagnosed Cancer Education;Other  Interventions Education;Psycho-Social Support  Acuity Level 2-Minimal Needs (1-2 Barriers Identified)  Coordination of Care -  Education Method Verbal;Written  Support Groups/Services Friends and Family  Time Spent with Patient 33

## 2020-12-26 NOTE — Progress Notes (Signed)
Name: Carmen [name]  Age/ Sex: 80 y.o., female   MRN/ DOB: [id number], [birth year]     PCP: Claretta Fraise, MD   Reason for Endocrinology Evaluation: Type 2 Diabetes Mellitus  Initial Endocrine Consultative Visit: 09/14/2018    PATIENT IDENTIFIER: Carmen [name] is a 80 y.o. female with a past medical history of HTN, PVC, neuromuscular disorder, hemochromatosis and CAD (S/P CABG) . The patient has followed with Endocrinology clinic since 09/14/2018 for consultative assistance with management of her diabetes.  DIABETIC HISTORY:  [name] was diagnosed with T2DM in 2016. Has tried oral glycemic agents in the past,Januvia- swelling , Trulicity - nausea , metformin - elevated LFT's . Her hemoglobin A1c has ranged from 6.6% in 2016, peaking at 8.6% in 2020.  Wilder Glade stopped 10/2018 due to vertigo   Rybelsus started 2/21  SUBJECTIVE:   During the last visit (12/29/2019): A1c was 6.4%. We continued Glipizide and  Rybelsus     Today (12/26/2020): [name] is here for a follow up on diabetes management.  She checks her blood sugars 2 times daily. The patient has not had hypoglycemic episodes since the last clinic visit.     She has been diagnosed with left breast ca ( invasive breast ca), planning radiation and chemo   Denies nausea or diarrhea   She used to have phlebotomies Q 6 month   HOME DIABETES REGIMEN:  Glipizide 10 mg 2 tabs before breakfast and 1 tablet before supper  Rybelsus 7 mg daily     METER DOWNLOAD SUMMARY: did not bring    DIABETIC COMPLICATIONS: Microvascular complications:  CKD III, neuropathy  Denies: retinopathy  Last eye exam: Completed 11/2018   Macrovascular complications:  CAD (S/P CABG) Denies: PVD, CVA     HISTORY:  Past Medical History:  Past Medical History:  Diagnosis Date   Arthritis    Asymptomatic PVCs    Bilateral shoulder pain    CAD (coronary artery disease)    Cancer ([place]) 11/2015   [id number]  right upper  arm   Chronic renal insufficiency, stage 3 (moderate) ([place])    Diabetes mellitus without complication ([place])    Diverticulosis    Endometrial polyp    External hemorrhoids    Gout    Hemochromatosis, hereditary ([place]) 11/24/2012   History of anemia    History of duodenal ulcer 1990   Hyperlipidemia    Hypertension    Internal hemorrhoids    Jaundice    age 45 or 49   Myocardial infarction ([place]) 2007   Neuromuscular disorder ([place])    peripheral neuropathy   PMB (postmenopausal bleeding)    PONV (postoperative nausea and vomiting)    Prolapse of female pelvic organs    uses pessary   Seizures ([place])    had one at Dr. Antonieta Pert office after getting blood drawn   Stage I breast cancer, left ([place]) 12/16/2020   Tick bite 08/12/2017   had 3 bites   Type 2 diabetes mellitus with hyperglycemia, without long-term current use of insulin ([place]) 01/27/2019   Vitamin D deficiency    Past Surgical History:  Past Surgical History:  Procedure Laterality Date   BREAST SURGERY     left breast lump--benign   CARDIAC CATHETERIZATION  06/03/2005   COLONOSCOPY  11/28/2001   CORONARY ARTERY BYPASS GRAFT  2007   x2 Dr. Roxan Hockey, LIMA to LAD, SVG to PDA   DILATATION & CURETTAGE/HYSTEROSCOPY WITH MYOSURE N/A 09/28/2017   Procedure: DILATATION & CURETTAGE/HYSTEROSCOPY WITH  [email or web address];  Surgeon: Molli Posey, MD;  Location: [facility];  Service: Gynecology;  Laterality: N/A;   LIPOMA EXCISION     back   UPPER GI ENDOSCOPY  01/20/1989   Social History:  reports that she has never smoked. She has never used smokeless tobacco. She reports current alcohol use. She reports that she does not use drugs. Family History:  Family History  Problem Relation Age of Onset   Stroke Mother    Hypertension Mother    Neuropathy Mother    Stroke Father    Hypertension Father    Diabetes Father    Cancer Sister 25       sarcoma   Diabetes Brother    Arthritis Sister    Arthritis Sister    Lupus  Sister    Hemachromatosis Sister    Arthritis Sister    Hemachromatosis Sister    Hemachromatosis Sister    Diverticulitis Sister    Diabetes Brother    Diabetes Brother    Hemachromatosis Brother    Lung cancer Brother      HOME MEDICATIONS: Allergies as of 12/26/2020       Reactions   Cymbalta [duloxetine Hcl] Other (See Comments)   Profuse sweating   Farxiga [dapagliflozin] Other (See Comments)   Dizziness   Januvia [sitagliptin] Swelling   Meloxicam Nausea And Vomiting   Shellfish Allergy Other (See Comments)   Gout   Trulicity [dulaglutide] Nausea Only   Crestor [rosuvastatin] Other (See Comments)   Muscle aches on 5 mg daily and 10 mg twice weekly   Diclofenac Diarrhea   Lipitor [atorvastatin] Other (See Comments)   Muscle aches   Livalo [pitavastatin] Other (See Comments)      Sulfa Antibiotics Rash   Tape Other (See Comments)   Causes skin irritation   Zetia [ezetimibe] Other (See Comments)   Muscle aches   Zocor [simvastatin] Other (See Comments)   Muscle aches         Medication List        Accurate as of December 26, 2020 11:55 AM. If you have any questions, ask your nurse or doctor.          Accu-Chek [employer] Kit Use to check Blood Sugars   allopurinol 100 MG tablet Commonly known as: ZYLOPRIM TAKE  (1)  TABLET TWICE A DAY.   aspirin 81 MG tablet Take 81 mg by mouth daily.   colchicine 0.6 MG tablet TAKE (1) TABLET DAILY AS NEEDED.   fluticasone 50 MCG/ACT nasal spray Commonly known as: FLONASE SPRAY 1 SPRAY IN EACH NOSTRIL ONCE DAILY. What changed: additional instructions   furosemide 20 MG tablet Commonly known as: LASIX TAKE 1 TABLET DAILY   glipiZIDE 10 MG tablet Commonly known as: GLUCOTROL TAKE 2 TABLETS BEFORE BREAKFAST AND 1 TABLET DAILY BEFORE SUPPER   glucose blood test strip Commonly known as: ONE TOUCH ULTRA TEST CHECK BLOOD SUGAR 2 TIMES A DAY   icosapent Ethyl 1 g capsule Commonly known as:  VASCEPA TAKE (2) CAPSULES TWICE DAILY.   meclizine 12.5 MG tablet Commonly known as: ANTIVERT Take 1 tablet (12.5 mg total) by mouth 3 (three) times daily as needed for dizziness.   metoprolol tartrate 25 MG tablet Commonly known as: LOPRESSOR TAKE  (1)  TABLET TWICE A DAY.   olmesartan 20 MG tablet Commonly known as: BENICAR TAKE 1 TABLET DAILY   Praluent 150 MG/ML So[place] Generic drug: Alirocumab Inject 1 Dose into the skin every 14 (fourteen)  days.   pregabalin 50 MG capsule Commonly known as: LYRICA TAKE (1) CAPSULE TWICE DAILY.   Restasis 0.05 % ophthalmic emulsion Generic drug: cycloSPORINE Place 2 drops into both eyes 2 (two) times daily as needed.   Rybelsus 7 MG Tabs Generic drug: Semaglutide TAKE 1 TABLET DAILY BEFORE BREAKFAST   Vitamin D (Ergocalciferol) 1.25 MG (50000 UNIT) Caps capsule Commonly known as: DRISDOL Take 1 capsule (50,000 Units total) by mouth 2 (two) times a week. What changed:  how much to take when to take this   Voltaren 1 % Gel Generic drug: diclofenac Sodium APPLY 4 GRAMS TO AFFECTED AREA(S) 4 TIMES A DAY AS NEEDED   diclofenac Sodium 1 % Gel Commonly known as: VOLTAREN APPLY 4 GRAMS TO AFFECTED AREA(S) 4 TIMES A DAY AS NEEDED         OBJECTIVE:   Vital Signs: BP 126/70 (BP Location: Left Arm, Patient Position: Sitting, Cuff Size: Small)   Pulse 60   Ht _0  (1.6 m)   Wt 179 lb (81.2 kg)   SpO2 96%   BMI 31.71 kg/m   Wt Readings from Last 3 Encounters:  12/26/20 179 lb (81.2 kg)  12/16/20 178 lb (80.7 kg)  06/25/20 184 lb 9.6 oz (83.7 kg)     Exam: General: Pt appears well and is in NAD  Lungs: Clear with good BS bilat with no rales, rhonchi, or wheezes  Heart: RRR with normal S1 and S2 and no gallops; no murmurs; no rub  Extremities: No pretibial edema.  Skin: Normal texture and temperature to palpation.   Neuro: MS is good with appropriate affect, pt is alert and Ox3     DM foot exam: 12/26/2020 The skin of  the feet is intact without sores or ulcerations. The pedal pulses are 2+ on right and 2+ on left. The sensation is absent to a screening 5.07, 10 gram monofilament bilaterally    DATA REVIEWED:  Lab Results  Component Value Date   HGBA1C 6.5 (A) 12/26/2020   HGBA1C 6.4 (A) 05/03/2020   HGBA1C 6.8 11/13/2019   Lab Results  Component Value Date   MICROALBUR <0.7 01/26/2019   LDLCALC 75 07/02/2020   CREATININE 1.28 (H) 12/16/2020    Lab Results  Component Value Date   CHOL 148 07/02/2020   HDL 38 (L) 07/02/2020   LDLCALC 75 07/02/2020   LDLDIRECT 75 12/02/2018   TRIG 212 (H) 07/02/2020   CHOLHDL 3.9 07/02/2020         ASSESSMENT / PLAN / RECOMMENDATIONS:   1) Type 2 Diabetes Mellitus, Optimally  controlled, With neuropathic and CKD III complications - Most recent A1c of 6.5 %. Goal A1c < 7.5 %.     -I have praised the patient on continued optimization of her glucose control, she is concerned about the effects of cancer treatment on her glucose readings, we discussed that hyperglycemia could be weeks corrected with certain chemotherapeutic agents, we also discussed considering prandial insulin for hyperglycemia if needed should this occur  -No changes at this time   MEDICATIONS: - Continue Glipizide 10 mg , 2 tabs with Breakfast and 1 tablet with Supper  - Continue  Rybelsus 7 mg daily with breakfast    EDUCATION / INSTRUCTIONS: BG monitoring instructions: Patient is instructed to check her blood sugars 3 times a week fasting . Call [facility] if: BG persistently < 70  I reviewed the Rule of 15 for the treatment of hypoglycemia in detail with the patient.  Literature supplied.     F/U in 4 months    Signed electronically by: Mack Guise, MD  [facility]  [address] [place], [place] [address] Phone: [phone] FAX: [phone]   CC: Claretta Fraise, [place] [address] Phone: [phone]  Fax: [phone]  Return to Endocrinology clinic as below: Future Appointments  Date Time Provider [place]  12/26/2020  2:00 PM [facility] A1 [facility] None  01/20/2021  1:55 PM Claretta Fraise, MD [facility] None  01/31/2021 10:00 AM [facility] SOCIAL WORK [facility] None  04/14/2021  1:15 PM [facility]-ANNUAL WELLNESS VISIT [facility] None

## 2020-12-31 ENCOUNTER — Ambulatory Visit: Payer: Medicare Other | Admitting: Hematology & Oncology

## 2020-12-31 ENCOUNTER — Other Ambulatory Visit: Payer: Medicare Other

## 2020-12-31 ENCOUNTER — Ambulatory Visit: Payer: Medicare Other | Admitting: Family Medicine

## 2021-01-01 ENCOUNTER — Encounter: Payer: Self-pay | Admitting: *Deleted

## 2021-01-01 NOTE — Progress Notes (Signed)
Patient has been scheduled for surgery on 01/14/2021. Will schedule follow up with Dr Marin Olp about 3 weeks after her surgery. Message sent to scheduling.   Oncology Nurse Navigator Documentation  Oncology Nurse Navigator Flowsheets 01/01/2021  Abnormal Finding Date -  Confirmed Diagnosis Date -  Diagnosis Status -  Planned Course of Treatment Surgery  Phase of Treatment Surgery  Expected Surgery Date 01/14/2021  Navigator Follow Up Date: 01/14/2021  Navigator Follow Up Reason: Surgery  Navigator Location [facility]  Referral Date to RadOnc/MedOnc -  Navigator Encounter Type Appt/Treatment Plan Review  Patient Visit Type MedOnc  Treatment Phase Pre-Tx/Tx Discussion  Barriers/Navigation Needs Coordination of Care;Education  Education -  Interventions Coordination of Care  Acuity Level 2-Minimal Needs (1-2 Barriers Identified)  Coordination of Care Appts  Education Method -  Support Groups/Services Friends and Family  Time Spent with Patient 15

## 2021-01-02 ENCOUNTER — Other Ambulatory Visit: Payer: Self-pay

## 2021-01-02 ENCOUNTER — Ambulatory Visit: Payer: Self-pay | Admitting: General Surgery

## 2021-01-02 ENCOUNTER — Ambulatory Visit: Payer: Medicare Other | Attending: General Surgery

## 2021-01-02 DIAGNOSIS — C50912 Malignant neoplasm of unspecified site of left female breast: Secondary | ICD-10-CM | POA: Insufficient documentation

## 2021-01-02 DIAGNOSIS — Z171 Estrogen receptor negative status [ER-]: Secondary | ICD-10-CM | POA: Diagnosis not present

## 2021-01-02 DIAGNOSIS — R293 Abnormal posture: Secondary | ICD-10-CM | POA: Insufficient documentation

## 2021-01-02 NOTE — Therapy (Signed)
[facility] @ [place], [place], [address] Phone: [phone]   Fax:  [phone]  Physical Therapy Treatment  Patient Details  Name: Carmen [name] MRN: [id number] Date of Birth: 12-Aug-1940 Referring Provider (PT): Dr. Marlou Starks   Encounter Date: 01/02/2021   PT End of Session - 01/02/21 1459     Visit Number 1    Number of Visits 2    Date for PT Re-Evaluation 02/13/21    Activity Tolerance Patient tolerated treatment well    Behavior During Therapy [facility] for tasks assessed/performed             Past Medical History:  Diagnosis Date   Arthritis    Asymptomatic PVCs    Bilateral shoulder pain    CAD (coronary artery disease)    Cancer ([place]) 11/2015   [id number]  right upper arm   Chronic renal insufficiency, stage 3 (moderate) ([place])    Diabetes mellitus without complication ([place])    Diverticulosis    Endometrial polyp    External hemorrhoids    Gout    Hemochromatosis, hereditary ([place]) 11/24/2012   History of anemia    History of duodenal ulcer 1990   Hyperlipidemia    Hypertension    Internal hemorrhoids    Jaundice    age 80 or 80   Myocardial infarction ([place]) 2007   Neuromuscular disorder ([place])    peripheral neuropathy   PMB (postmenopausal bleeding)    PONV (postoperative nausea and vomiting)    Prolapse of female pelvic organs    uses pessary   Seizures ([place])    had one at Dr. Antonieta Pert office after getting blood drawn   Stage I breast cancer, left ([place]) 12/16/2020   Tick bite 08/12/2017   had 3 bites   Type 2 diabetes mellitus with hyperglycemia, without long-term current use of insulin ([place]) 01/27/2019   Vitamin D deficiency     Past Surgical History:  Procedure Laterality Date   BREAST SURGERY     left breast lump--benign   CARDIAC CATHETERIZATION  06/03/2005   COLONOSCOPY  11/28/2001   CORONARY ARTERY BYPASS GRAFT  2007   x2 Dr. Roxan Hockey, [place] to [facility]AD, [place] to [place] N/A 09/28/2017   Procedure: [place];  Surgeon: Molli Posey, MD;  Location: [facility];  Service: Gynecology;  Laterality: N/A;   LIPOMA EXCISION     back   UPPER GI ENDOSCOPY  01/20/1989    There were no vitals filed for this visit.   Subjective Assessment - 01/02/21 1413     Subjective Pt was diagnosed with left breast cancer after her normal screening mammogram on Nov 13, 2020.  She had a biopsy which determined Invasive Breast Cancer, triple negative    Pertinent History Pt was diagnosed with Left Invasive Breast Cancer that is Triple Negative with a Ki67 of 40%.  She is pending left lumpectomy with SLNB on 01/14/2021.Pt has hemochromatosis, DM,Melanoma arm, leg x 4, MI,open heart surgery,gout,Renal insufficiency    Patient Stated Goals gain information from providers, pre-surgery screening    Currently in Pain? No/denies    Pain Score 0-No pain                [facility] PT Assessment - 01/02/21 0001       Assessment   Medical Diagnosis Left Breast Cancer    Referring Provider (PT) Dr. Marlou Starks  Onset Date/Surgical Date 01/14/21    Hand Dominance Right    Prior Therapy yes      Precautions   Precaution Comments Active CA      Restrictions   Weight Bearing Restrictions --   uses Cane     Balance Screen   Has the patient fallen in the past 6 months No    Has the patient had a decrease in activity level because of a fear of falling?  No    Is the patient reluctant to leave their home because of a fear of falling?  No      Home Environment   Living Environment Private residence    Living Arrangements Alone    Available Help at Discharge Family    Type of [place] or work area in basement;Two level      Prior Function   Level of Independence Independent    Vocation Retired   [employer] walking in summer, word games,  [place]      Cognition   Overall Cognitive Status Within Functional Limits for tasks assessed      Observation/Other Assessments   Skin Integrity WNL      Posture/Postural Control   Posture/Postural Control Postural limitations    Postural Limitations Rounded Shoulders;Forward head      AROM   AROM Assessment Site Shoulder    Right Shoulder Extension 71 Degrees    Right Shoulder Flexion 158 Degrees    Right Shoulder ABduction 178 Degrees    Right Shoulder Internal Rotation 50 Degrees    Right Shoulder External Rotation 108 Degrees    Left Shoulder Extension 65 Degrees    Left Shoulder Flexion 168 Degrees    Left Shoulder ABduction 178 Degrees    Left Shoulder Internal Rotation 54 Degrees    Left Shoulder External Rotation 106 Degrees               LYMPHEDEMA/ONCOLOGY QUESTIONNAIRE - 01/02/21 0001       Type   Cancer Type Invasive Breast Cancer      Surgeries   Lumpectomy Date 01/14/21    Sentinel Lymph Node Biopsy Date 01/14/21      Treatment   Active Chemotherapy Treatment No    Past Chemotherapy Treatment No    Active Radiation Treatment No    Past Radiation Treatment No    Current Hormone Treatment No    Past Hormone Therapy No      What other symptoms do you have   Are you Having Heaviness or Tightness No    Are you having Pain No    Are you having pitting edema No    Is it Hard or Difficult finding clothes that fit No    Do you have infections No    Is there Decreased scar mobility No    St[place]mer Sign No      Right Upper Extremity Lymphedema   10 cm Proximal to Olecranon Process 37.6 cm    Olecranon Process 28.7 cm    10 cm Proximal to Ulnar Styloid Process 24.6 cm    Just Proximal to Ulnar Styloid Process 17.4 cm    At Base of 2nd Digit 6.4 cm      Left Upper Extremity Lymphedema   10 cm Proximal to Olecranon Process 37.5 cm    Olecranon Process 29 cm    10 cm Proximal to Ulnar Styloid Process 24.7 cm    Just Proximal  to Ulnar Styloid Process  17.6 cm    At [facility] of 2nd Digit 6.25 cm             L-DEX FLOWSHEETS - 01/02/21 1400       L-DEX LYMPHEDEMA SCREENING   Measurement Type Unilateral    L-DEX MEASUREMENT EXTREMITY Upper Extremity    POSITION  Standing    DOMINANT SIDE Right    At Risk Side Left    BASELINE SCORE (UNILATERAL) -1.6               Quick Dash - 01/02/21 0001     Open a tight or new jar Moderate difficulty    Do heavy household chores (wash walls, wash floors) No difficulty    Carry a shopping bag or briefcase Mild difficulty    Wash your back No difficulty    Use a knife to cut food No difficulty    Recreational activities in which you take some force or impact through your arm, shoulder, or hand (golf, hammering, tennis) No difficulty    During the past week, to what extent has your arm, shoulder or hand problem interfered with your normal social activities with family, friends, neighbors, or groups? Not at all    During the past week, to what extent has your arm, shoulder or hand problem limited your work or other regular daily activities Not at all    Arm, shoulder, or hand pain. None    Tingling (pins and needles) in your arm, shoulder, or hand Mild    Difficulty Sleeping Mild difficulty    DASH Score 11.36 %                             PT Education - 01/02/21 1501     Education Details 4 post op exercises, ABC class, lymphedema precautions, SOZO    Person(s) Educated Patient    Methods Explanation;Handout;Demonstration                 PT Long Term Goals - 01/02/21 2001       PT LONG TERM GOAL #1   Title Pt will achieve left shoulder AROM within 5-7 degrees of baselines ROM for ability to perform home activities    Time 6    Period Weeks    Status New    Target Date 02/13/21             Breast Clinic Goals - 01/02/21 2000       Patient will be able to verbalize understanding of pertinent lymphedema risk reduction practices relevant to her  diagnosis specifically related to skin care.   Time 1    Period Days    Status Achieved    Target Date 01/02/21      Patient will be able to return demonstrate and/or verbalize understanding of the post-op home exercise program related to regaining shoulder range of motion.   Time 1    Period Days    Status Achieved    Target Date 01/02/21      Patient will be able to verbalize understanding of the importance of attending the postoperative After Breast Cancer Class for further lymphedema risk reduction education and therapeutic exercise.   Time 1    Period Days    Status Achieved    Target Date 01/02/21                  Plan - 01/02/21 1951  Clinical Impression Statement Pt was seen today for presurgical screening.  She is pending a left lumpectomy with SLNB on 01/14/2021.  Baselines were taken today for shoulder ROM, circumferential measures, and SOZO .  She was instructed in 4 post op exercises, attendance at [facility] class, lymphedema precautions, and SOZO screens every 3 months for 2 years.    Personal Factors and Comorbidities Comorbidity 3+    Comorbidities Invasive Breast CA pending Chemo and radiation,hemachromatosis, DM, renal insufficiency,CABG    Stability/Clinical Decision Making Stable/Uncomplicated    Rehab Potential Excellent    PT Frequency 1x / week    PT Duration 6 weeks    PT Treatment/Interventions Therapeutic exercise;Patient/family education    PT Next Visit Plan Reassess post surgery    Recommended Other Services ABC class, SOZO already scheduled    Consulted and Agree with Plan of Care Patient             Patient will benefit from skilled therapeutic intervention in order to improve the following deficits and impairments:  Decreased knowledge of precautions, Postural dysfunction  Visit Diagnosis: Malignant neoplasm of left breast in female, estrogen receptor negative, unspecified site of breast ([place])  Abnormal posture     Problem  List Patient Active Problem List   Diagnosis Date Noted   Current moderate episode of major depressive disorder without prior episode ([place]) 12/26/2020   Coronary artery disease of bypass graft of native heart with stable angina pectoris ([place]) 12/26/2020   Stage I breast cancer, left ([place]) 12/16/2020   Type 2 diabetes mellitus with diabetic polyneuropathy, without long-term current use of insulin ([place]) 12/29/2019   Type 2 diabetes mellitus with stage 3b chronic kidney disease, without long-term current use of insulin ([place]) 01/27/2019   Chronic renal insufficiency, stage 3 (moderate) ([facility]) 09/10/2017   Chronic pain of both shoulders 04/27/2017   Vitamin D deficiency 01/28/2016   Non-insulin treated type 2 diabetes mellitus ([place]) 11/12/2015   Statin intolerance 08/18/2013   Neuromuscular disorder ([place])    Hemochromatosis, hereditary ([place]) 11/24/2012   S/P CABG x 2 08/09/2012   PVC's (premature ventricular contractions) 08/09/2012   Gout 06/20/2012   Hypertension associated with diabetes ([place]) 06/20/2012   HLD (hyperlipidemia) 06/20/2012   Arthritis 06/20/2012    Claris Pong, PT 01/02/2021, 8:06 PM  [facility] @ [place], [place], [address] Phone: [phone]   Fax:  [phone]  Name: Carmen [name] MRN: [id number] Date of Birth: Feb 11, 1941

## 2021-01-02 NOTE — Patient Instructions (Signed)
Physical Therapy Information for After Breast Cancer Surgery/Treatment:  Lymphedema is a swelling condition that you may be at risk for in your arm if you have lymph nodes removed from the armpit area.  After a sentinel node biopsy, the risk is approximately 5-9% and is higher after an axillary node dissection.  There is treatment available for this condition and it is not life-threatening.  Contact your physician or physical therapist with concerns. You may begin the 4 shoulder/posture exercises (see additional sheet) when permitted by your physician (typically a week after surgery).  If you have drains, you may need to wait until those are removed before beginning range of motion exercises.  A general recommendation is to not lift your arms above shoulder height until drains are removed.  These exercises should be done to your tolerance and gently.  This is not a "no pain/no gain" type of recovery so listen to your body and stretch into the range of motion that you can tolerate, stopping if you have pain.  If you are having immediate reconstruction, ask your plastic surgeon about doing exercises as he or she may want you to wait. We encourage you to attend the free one time ABC (After Breast Cancer) class offered by [facility].  You will learn information related to lymphedema risk, prevention and treatment and additional exercises to regain mobility following surgery.  You can call [phone] for more information.  This is offered the 1st and 3rd Monday of each month.  You only attend the class one time. While undergoing any medical procedure or treatment, try to avoid blood pressure being taken or needle sticks from occurring on the arm on the side of cancer.   This recommendation begins after surgery and continues for the rest of your life.  This may help reduce your risk of getting lymphedema (swelling in your arm). An excellent resource for those seeking information on  lymphedema is the National Lymphedema Network's web site. It can be accessed at [place].org If you notice swelling in your hand, arm or breast at any time following surgery (even if it is many years from now), please contact your doctor or physical therapist to discuss this.  Lymphedema can be treated at any time but it is easier for you if it is treated early on.  If you feel like your shoulder motion is not returning to normal in a reasonable amount of time, please contact your surgeon or physical therapist.  [facility]. [place], [place], [place] [phone]; [address], [place], [place] [address] ABC CLASS After Breast Cancer Class  After Breast Cancer Class is a specially designed exercise class to assist you in a safe recover after having breast cancer surgery.  In this class you will learn how to get back to full function whether your drains were just removed or if you had surgery a month ago.  This one-time class is held the 1st and 3rd Monday of every month from 11:00 a.m. until 12:00 noon at the [facility] located at [place], [place] [address]  This class is FREE and space is limited. For more information or to register for the next available class, call [phone].  Class Goals  Understand specific stretches to improve the flexibility of you chest and shoulder. Learn ways to safely strengthen your upper body and improve your posture. Understand the warning signs of infection and why you may be at risk for an arm infection.  Learn about Lymphedema and prevention.  ** You do not attend this class until after surgery.  Drains must be removed to participate  Patient was instructed today in a home exercise program today for post op shoulder range of motion. These included active assist shoulder flexion in sitting/supine, scapular retraction, wall walking with shoulder abduction, and hands behind head external rotation.  She was encouraged to  do these twice a day, holding 3 seconds and repeating 5 times when permitted by her physician.

## 2021-01-06 ENCOUNTER — Other Ambulatory Visit: Payer: Self-pay

## 2021-01-06 ENCOUNTER — Encounter (HOSPITAL_BASED_OUTPATIENT_CLINIC_OR_DEPARTMENT_OTHER): Payer: Self-pay | Admitting: General Surgery

## 2021-01-06 DIAGNOSIS — Z85820 Personal history of malignant melanoma of skin: Secondary | ICD-10-CM | POA: Diagnosis not present

## 2021-01-06 DIAGNOSIS — L720 Epidermal cyst: Secondary | ICD-10-CM | POA: Diagnosis not present

## 2021-01-06 DIAGNOSIS — Z85828 Personal history of other malignant neoplasm of skin: Secondary | ICD-10-CM | POA: Diagnosis not present

## 2021-01-06 DIAGNOSIS — D171 Benign lipomatous neoplasm of skin and subcutaneous tissue of trunk: Secondary | ICD-10-CM | POA: Diagnosis not present

## 2021-01-06 DIAGNOSIS — D2271 Melanocytic nevi of right lower limb, including hip: Secondary | ICD-10-CM | POA: Diagnosis not present

## 2021-01-06 DIAGNOSIS — D485 Neoplasm of uncertain behavior of skin: Secondary | ICD-10-CM | POA: Diagnosis not present

## 2021-01-06 DIAGNOSIS — L821 Other seborrheic keratosis: Secondary | ICD-10-CM | POA: Diagnosis not present

## 2021-01-07 ENCOUNTER — Encounter: Payer: Self-pay | Admitting: *Deleted

## 2021-01-07 NOTE — Progress Notes (Signed)
Received a call from patient asking about FMLA for her daughter. Oneita Hurt will be a primary support for patient and will need time off work starting next week to help her mom with her treatment. Confirmed with Jayda that we can complete paperwork and gave her our fax number.   Oncology Nurse Navigator Documentation  Oncology Nurse Navigator Flowsheets 01/07/2021  Abnormal Finding Date -  Confirmed Diagnosis Date -  Diagnosis Status -  Planned Course of Treatment -  Phase of Treatment -  Expected Surgery Date -  Navigator Follow Up Date: 01/14/2021  Navigator Follow Up Reason: Surgery  Navigator Location [facility]  Referral Date to RadOnc/MedOnc -  Navigator Encounter Type Telephone  Telephone FMLA/Disability;Incoming Call  Patient Visit Type MedOnc  Treatment Phase Pre-Tx/Tx Discussion  Barriers/Navigation Needs Coordination of Care;Education  Education Other  Interventions Disability/FMLA  Acuity Level 2-Minimal Needs (1-2 Barriers Identified)  Coordination of Care -  Education Method Verbal  Support Groups/Services Friends and Family  Time Spent with Patient 30

## 2021-01-08 ENCOUNTER — Encounter: Payer: Medicare Other | Admitting: Family Medicine

## 2021-01-08 ENCOUNTER — Encounter (HOSPITAL_BASED_OUTPATIENT_CLINIC_OR_DEPARTMENT_OTHER)
Admission: RE | Admit: 2021-01-08 | Discharge: 2021-01-08 | Disposition: A | Discharge status: Home / Self Care | Payer: Medicare Other | Source: Ambulatory Visit | Attending: General Surgery | Admitting: General Surgery

## 2021-01-08 DIAGNOSIS — Z01812 Encounter for preprocedural laboratory examination: Secondary | ICD-10-CM | POA: Diagnosis not present

## 2021-01-08 MED ORDER — CHLORHEXIDINE GLUCONATE CLOTH 2 % EX PADS: 6.0000 | MEDICATED_PAD | Freq: Once | CUTANEOUS | Status: DC

## 2021-01-08 NOTE — Progress Notes (Signed)

## 2021-01-09 LAB — BASIC METABOLIC PANEL
Anion gap: 7 (ref 5–15)
BUN: 21 mg/dL (ref 8–23)
CO2: 26 mmol/L (ref 22–32)
Calcium: 9.5 mg/dL (ref 8.9–10.3)
Chloride: 105 mmol/L (ref 98–111)
Creatinine, Ser: 1.22 mg/dL — ABNORMAL HIGH (ref 0.44–1.00)
GFR, Estimated: 45 mL/min — ABNORMAL LOW (ref >60)
Glucose, Bld: 141 mg/dL — ABNORMAL HIGH (ref 70–99)
Potassium: 6.3 mmol/L (ref 3.5–5.1)
Sodium: 138 mmol/L (ref 135–145)

## 2021-01-09 NOTE — Progress Notes (Signed)
K+ 6.3, Dr. Gloris Manchester aware, will repeat BMET day of surgery.

## 2021-01-10 ENCOUNTER — Other Ambulatory Visit: Payer: Self-pay | Admitting: Family Medicine

## 2021-01-10 DIAGNOSIS — Z951 Presence of aortocoronary bypass graft: Secondary | ICD-10-CM

## 2021-01-10 DIAGNOSIS — E1140 Type 2 diabetes mellitus with diabetic neuropathy, unspecified: Secondary | ICD-10-CM

## 2021-01-10 DIAGNOSIS — E1159 Type 2 diabetes mellitus with other circulatory complications: Secondary | ICD-10-CM

## 2021-01-10 DIAGNOSIS — I152 Hypertension secondary to endocrine disorders: Secondary | ICD-10-CM

## 2021-01-13 DIAGNOSIS — C50512 Malignant neoplasm of lower-outer quadrant of left female breast: Secondary | ICD-10-CM | POA: Diagnosis not present

## 2021-01-14 ENCOUNTER — Encounter (HOSPITAL_BASED_OUTPATIENT_CLINIC_OR_DEPARTMENT_OTHER)
Admission: RE | Disposition: A | Discharge status: Home / Self Care | Payer: Self-pay | Source: Home / Self Care | Attending: General Surgery

## 2021-01-14 ENCOUNTER — Ambulatory Visit (HOSPITAL_COMMUNITY): Payer: Medicare Other

## 2021-01-14 ENCOUNTER — Ambulatory Visit (HOSPITAL_BASED_OUTPATIENT_CLINIC_OR_DEPARTMENT_OTHER)
Admission: RE | Admit: 2021-01-14 | Discharge: 2021-01-14 | Disposition: A | Discharge status: Home / Self Care | Payer: Medicare Other | Attending: General Surgery | Admitting: General Surgery

## 2021-01-14 ENCOUNTER — Other Ambulatory Visit: Payer: Self-pay

## 2021-01-14 ENCOUNTER — Ambulatory Visit (HOSPITAL_BASED_OUTPATIENT_CLINIC_OR_DEPARTMENT_OTHER): Payer: Medicare Other | Admitting: Certified Registered"

## 2021-01-14 ENCOUNTER — Encounter (HOSPITAL_BASED_OUTPATIENT_CLINIC_OR_DEPARTMENT_OTHER): Payer: Self-pay | Admitting: General Surgery

## 2021-01-14 ENCOUNTER — Encounter: Payer: Self-pay | Admitting: *Deleted

## 2021-01-14 DIAGNOSIS — Z951 Presence of aortocoronary bypass graft: Secondary | ICD-10-CM | POA: Diagnosis not present

## 2021-01-14 DIAGNOSIS — Z171 Estrogen receptor negative status [ER-]: Secondary | ICD-10-CM | POA: Diagnosis not present

## 2021-01-14 DIAGNOSIS — L821 Other seborrheic keratosis: Secondary | ICD-10-CM | POA: Insufficient documentation

## 2021-01-14 DIAGNOSIS — D171 Benign lipomatous neoplasm of skin and subcutaneous tissue of trunk: Secondary | ICD-10-CM | POA: Diagnosis not present

## 2021-01-14 DIAGNOSIS — N1832 Chronic kidney disease, stage 3b: Secondary | ICD-10-CM | POA: Diagnosis not present

## 2021-01-14 DIAGNOSIS — Z452 Encounter for adjustment and management of vascular access device: Secondary | ICD-10-CM | POA: Diagnosis not present

## 2021-01-14 DIAGNOSIS — Z419 Encounter for procedure for purposes other than remedying health state, unspecified: Secondary | ICD-10-CM

## 2021-01-14 DIAGNOSIS — I129 Hypertensive chronic kidney disease with stage 1 through stage 4 chronic kidney disease, or unspecified chronic kidney disease: Secondary | ICD-10-CM | POA: Diagnosis not present

## 2021-01-14 DIAGNOSIS — I152 Hypertension secondary to endocrine disorders: Secondary | ICD-10-CM

## 2021-01-14 DIAGNOSIS — Z95828 Presence of other vascular implants and grafts: Secondary | ICD-10-CM

## 2021-01-14 DIAGNOSIS — C50512 Malignant neoplasm of lower-outer quadrant of left female breast: Secondary | ICD-10-CM | POA: Diagnosis not present

## 2021-01-14 DIAGNOSIS — C50912 Malignant neoplasm of unspecified site of left female breast: Secondary | ICD-10-CM | POA: Diagnosis not present

## 2021-01-14 DIAGNOSIS — G8918 Other acute postprocedural pain: Secondary | ICD-10-CM | POA: Diagnosis not present

## 2021-01-14 DIAGNOSIS — E1122 Type 2 diabetes mellitus with diabetic chronic kidney disease: Secondary | ICD-10-CM | POA: Diagnosis not present

## 2021-01-14 DIAGNOSIS — E1159 Type 2 diabetes mellitus with other circulatory complications: Secondary | ICD-10-CM

## 2021-01-14 HISTORY — PX: BREAST LUMPECTOMY WITH RADIOACTIVE SEED AND SENTINEL LYMPH NODE BIOPSY: SHX6550

## 2021-01-14 HISTORY — PX: PORTACATH PLACEMENT: SHX2246

## 2021-01-14 HISTORY — PX: LIPOMA EXCISION: SHX5283

## 2021-01-14 LAB — BASIC METABOLIC PANEL
Anion gap: 10 (ref 5–15)
BUN: 25 mg/dL — ABNORMAL HIGH (ref 8–23)
CO2: 24 mmol/L (ref 22–32)
Calcium: 9.3 mg/dL (ref 8.9–10.3)
Chloride: 102 mmol/L (ref 98–111)
Creatinine, Ser: 1.44 mg/dL — ABNORMAL HIGH (ref 0.44–1.00)
GFR, Estimated: 37 mL/min — ABNORMAL LOW (ref >60)
Glucose, Bld: 138 mg/dL — ABNORMAL HIGH (ref 70–99)
Potassium: 3.5 mmol/L (ref 3.5–5.1)
Sodium: 136 mmol/L (ref 135–145)

## 2021-01-14 LAB — GLUCOSE, CAPILLARY
Glucose-Capillary: 149 mg/dL — ABNORMAL HIGH (ref 70–99)
Glucose-Capillary: 170 mg/dL — ABNORMAL HIGH (ref 70–99)

## 2021-01-14 SURGERY — BREAST LUMPECTOMY WITH RADIOACTIVE SEED AND SENTINEL LYMPH NODE BIOPSY
Anesthesia: General | Site: Chest | Laterality: Right

## 2021-01-14 MED ORDER — DEXAMETHASONE SODIUM PHOSPHATE 4 MG/ML IJ SOLN
INTRAMUSCULAR | Status: DC | PRN
  Administered 2021-01-14: 4 mg via INTRAVENOUS

## 2021-01-14 MED ORDER — HYDROCODONE-ACETAMINOPHEN 5-325 MG PO TABS: 1.0000 | ORAL_TABLET | Freq: Four times a day (QID) | ORAL | 0 refills | Status: DC | PRN

## 2021-01-14 MED ORDER — PHENYLEPHRINE HCL (PRESSORS) 10 MG/ML IV SOLN
INTRAVENOUS | Status: DC | PRN
  Administered 2021-01-14 (×5): 80 ug via INTRAVENOUS

## 2021-01-14 MED ORDER — BUPIVACAINE-EPINEPHRINE 0.25% -1:200000 IJ SOLN
INTRAMUSCULAR | Status: DC | PRN
  Administered 2021-01-14: 27 mL
  Administered 2021-01-14: 3 mL

## 2021-01-14 MED ORDER — OXYCODONE HCL 5 MG/5ML PO SOLN: 5.0000 mg | Freq: Once | ORAL | Status: DC | PRN

## 2021-01-14 MED ORDER — ONDANSETRON HCL 4 MG/2ML IJ SOLN
INTRAMUSCULAR | Status: AC
  Filled 2021-01-14: qty 2

## 2021-01-14 MED ORDER — LIDOCAINE 2% (20 MG/ML) 5 ML SYRINGE
INTRAMUSCULAR | Status: AC
  Filled 2021-01-14: qty 5

## 2021-01-14 MED ORDER — FENTANYL CITRATE (PF) 100 MCG/2ML IJ SOLN
INTRAMUSCULAR | Status: DC | PRN
  Administered 2021-01-14: 50 ug via INTRAVENOUS

## 2021-01-14 MED ORDER — PHENYLEPHRINE 40 MCG/ML (10ML) SYRINGE FOR IV PUSH (FOR BLOOD PRESSURE SUPPORT)
PREFILLED_SYRINGE | INTRAVENOUS | Status: AC
  Filled 2021-01-14: qty 10

## 2021-01-14 MED ORDER — LIDOCAINE 2% (20 MG/ML) 5 ML SYRINGE
INTRAMUSCULAR | Status: DC | PRN
  Administered 2021-01-14: 40 mg via INTRAVENOUS

## 2021-01-14 MED ORDER — HEPARIN SOD (PORK) LOCK FLUSH 100 UNIT/ML IV SOLN
INTRAVENOUS | Status: AC
  Filled 2021-01-14: qty 5

## 2021-01-14 MED ORDER — ACETAMINOPHEN 500 MG PO TABS
ORAL_TABLET | ORAL | Status: AC
  Filled 2021-01-14: qty 2

## 2021-01-14 MED ORDER — HYDROMORPHONE HCL 1 MG/ML IJ SOLN: 0.2500 mg | INTRAMUSCULAR | Status: DC | PRN

## 2021-01-14 MED ORDER — MAGTRACE LYMPHATIC TRACER
INTRAMUSCULAR | Status: DC | PRN
  Administered 2021-01-14: 2 mL via INTRAMUSCULAR

## 2021-01-14 MED ORDER — FENTANYL CITRATE (PF) 100 MCG/2ML IJ SOLN
INTRAMUSCULAR | Status: AC
  Filled 2021-01-14: qty 2

## 2021-01-14 MED ORDER — PROPOFOL 10 MG/ML IV BOLUS
INTRAVENOUS | Status: AC
  Filled 2021-01-14: qty 20

## 2021-01-14 MED ORDER — MIDAZOLAM HCL 2 MG/2ML IJ SOLN
INTRAMUSCULAR | Status: AC
  Filled 2021-01-14: qty 2

## 2021-01-14 MED ORDER — GABAPENTIN 100 MG PO CAPS
ORAL_CAPSULE | ORAL | Status: AC
  Filled 2021-01-14: qty 1

## 2021-01-14 MED ORDER — HEPARIN SOD (PORK) LOCK FLUSH 100 UNIT/ML IV SOLN
INTRAVENOUS | Status: DC | PRN
  Administered 2021-01-14: 500 [IU]

## 2021-01-14 MED ORDER — PROMETHAZINE HCL 25 MG/ML IJ SOLN: 6.2500 mg | INTRAMUSCULAR | Status: DC | PRN

## 2021-01-14 MED ORDER — EPHEDRINE 5 MG/ML INJ
INTRAVENOUS | Status: AC
  Filled 2021-01-14: qty 10

## 2021-01-14 MED ORDER — GABAPENTIN 100 MG PO CAPS: 100.0000 mg | ORAL_CAPSULE | ORAL | Status: DC

## 2021-01-14 MED ORDER — HEPARIN (PORCINE) IN NACL 1000-0.9 UT/500ML-% IV SOLN
INTRAVENOUS | Status: AC
  Filled 2021-01-14: qty 500

## 2021-01-14 MED ORDER — EPHEDRINE SULFATE 50 MG/ML IJ SOLN
INTRAMUSCULAR | Status: DC | PRN
  Administered 2021-01-14 (×2): 15 mg via INTRAVENOUS
  Administered 2021-01-14 (×2): 10 mg via INTRAVENOUS

## 2021-01-14 MED ORDER — ACETAMINOPHEN 500 MG PO TABS
1000.0000 mg | ORAL_TABLET | ORAL | Status: AC
  Administered 2021-01-14: 1000 mg via ORAL

## 2021-01-14 MED ORDER — FENTANYL CITRATE (PF) 100 MCG/2ML IJ SOLN
50.0000 ug | Freq: Once | INTRAMUSCULAR | Status: AC
  Administered 2021-01-14: 50 ug via INTRAVENOUS

## 2021-01-14 MED ORDER — LACTATED RINGERS IV SOLN: INTRAVENOUS | Status: DC

## 2021-01-14 MED ORDER — MIDAZOLAM HCL 2 MG/2ML IJ SOLN
1.0000 mg | Freq: Once | INTRAMUSCULAR | Status: AC
  Administered 2021-01-14: 1 mg via INTRAVENOUS

## 2021-01-14 MED ORDER — CEFAZOLIN SODIUM-DEXTROSE 2-4 GM/100ML-% IV SOLN
2.0000 g | INTRAVENOUS | Status: AC
  Administered 2021-01-14: 2 g via INTRAVENOUS

## 2021-01-14 MED ORDER — OXYCODONE HCL 5 MG PO TABS: 5.0000 mg | ORAL_TABLET | Freq: Once | ORAL | Status: DC | PRN

## 2021-01-14 MED ORDER — CEFAZOLIN SODIUM-DEXTROSE 2-4 GM/100ML-% IV SOLN
INTRAVENOUS | Status: AC
  Filled 2021-01-14: qty 100

## 2021-01-14 MED ORDER — ROPIVACAINE HCL 5 MG/ML IJ SOLN
INTRAMUSCULAR | Status: DC | PRN
  Administered 2021-01-14: 30 mL via PERINEURAL

## 2021-01-14 MED ORDER — PROPOFOL 10 MG/ML IV BOLUS
INTRAVENOUS | Status: DC | PRN
  Administered 2021-01-14: 150 mg via INTRAVENOUS

## 2021-01-14 MED ORDER — ONDANSETRON HCL 4 MG/2ML IJ SOLN
INTRAMUSCULAR | Status: DC | PRN
  Administered 2021-01-14: 4 mg via INTRAVENOUS

## 2021-01-14 MED ORDER — HEPARIN (PORCINE) IN NACL 2-0.9 UNITS/ML
INTRAMUSCULAR | Status: AC | PRN
  Administered 2021-01-14: 1

## 2021-01-14 SURGICAL SUPPLY — 63 items
ADH SKN CLS APL DERMABOND .7 (GAUZE/BANDAGES/DRESSINGS) ×9
APL PRP STRL LF DISP 70% ISPRP (MISCELLANEOUS) ×3
APPLIER CLIP 9.375 MED OPEN (MISCELLANEOUS) ×8
APR CLP MED 9.3 20 MLT OPN (MISCELLANEOUS) ×6
BAG DECANTER FOR FLEXI CONT (MISCELLANEOUS) ×4 IMPLANT
BLADE SURG 15 STRL LF DISP TIS (BLADE) ×3 IMPLANT
BLADE SURG 15 STRL SS (BLADE) ×4
CANISTER SUC SOCK COL 7IN (MISCELLANEOUS) IMPLANT
CANISTER SUCT 1200ML W/VALVE (MISCELLANEOUS) IMPLANT
CHLORAPREP W/TINT 26 (MISCELLANEOUS) ×4 IMPLANT
CLEANER CAUTERY TIP 5X5 PAD (MISCELLANEOUS) ×3 IMPLANT
CLIP APPLIE 9.375 MED OPEN (MISCELLANEOUS) ×6 IMPLANT
COVER BACK TABLE 60X90IN (DRAPES) ×4 IMPLANT
COVER MAYO STAND STRL (DRAPES) ×4 IMPLANT
COVER PROBE W GEL 5X96 (DRAPES) ×8 IMPLANT
DECANTER SPIKE VIAL GLASS SM (MISCELLANEOUS) IMPLANT
DERMABOND ADVANCED (GAUZE/BANDAGES/DRESSINGS) ×3
DERMABOND ADVANCED .7 DNX12 (GAUZE/BANDAGES/DRESSINGS) ×9 IMPLANT
DRAPE C-ARM 42X72 X-RAY (DRAPES) ×4 IMPLANT
DRAPE LAPAROSCOPIC ABDOMINAL (DRAPES) ×4 IMPLANT
DRAPE UTILITY XL STRL (DRAPES) ×4 IMPLANT
ELECT COATED BLADE 2.86 ST (ELECTRODE) ×4 IMPLANT
ELECT REM PT RETURN 9FT ADLT (ELECTROSURGICAL) ×4
ELECTRODE REM PT RTRN 9FT ADLT (ELECTROSURGICAL) ×3 IMPLANT
GLOVE SURG ENC MOIS LTX SZ7.5 (GLOVE) ×4 IMPLANT
GLOVE SURG UNDER POLY LF SZ7 (GLOVE) ×8 IMPLANT
GOWN STRL REUS W/ TWL LRG LVL3 (GOWN DISPOSABLE) ×9 IMPLANT
GOWN STRL REUS W/ TWL XL LVL3 (GOWN DISPOSABLE) ×3 IMPLANT
GOWN STRL REUS W/TWL LRG LVL3 (GOWN DISPOSABLE) ×12
GOWN STRL REUS W/TWL XL LVL3 (GOWN DISPOSABLE) ×4
ILLUMINATOR WAVEGUIDE N/F (MISCELLANEOUS) IMPLANT
IV KIT MINILOC 20X1 SAFETY (NEEDLE) IMPLANT
KIT MARKER MARGIN INK (KITS) ×4 IMPLANT
KIT PORT POWER 8FR ISP CVUE (Port) ×4 IMPLANT
LIGHT WAVEGUIDE WIDE FLAT (MISCELLANEOUS) IMPLANT
NDL SAFETY ECLIPSE 18X1.5 (NEEDLE) ×3 IMPLANT
NEEDLE HYPO 18GX1.5 SHARP (NEEDLE) ×4
NEEDLE HYPO 22GX1.5 SAFETY (NEEDLE) IMPLANT
NEEDLE HYPO 25X1 1.5 SAFETY (NEEDLE) ×8 IMPLANT
NEEDLE SPNL 22GX3.5 QUINCKE BK (NEEDLE) IMPLANT
NS IRRIG 1000ML POUR BTL (IV SOLUTION) IMPLANT
PACK BASIN DAY SURGERY FS (CUSTOM PROCEDURE TRAY) ×4 IMPLANT
PAD CLEANER CAUTERY TIP 5X5 (MISCELLANEOUS) ×1
PENCIL SMOKE EVACUATOR (MISCELLANEOUS) ×4 IMPLANT
SLEEVE SCD COMPRESS KNEE MED (STOCKING) ×4 IMPLANT
SPONGE T-LAP 18X18 ~~LOC~~+RFID (SPONGE) ×8 IMPLANT
SUT MNCRL AB 4-0 PS2 18 (SUTURE) ×8 IMPLANT
SUT MON AB 4-0 PC3 18 (SUTURE) ×8 IMPLANT
SUT PROLENE 2 0 SH DA (SUTURE) ×4 IMPLANT
SUT SILK 2 0 SH (SUTURE) IMPLANT
SUT SILK 2 0 TIES 17X18 (SUTURE)
SUT SILK 2-0 18XBRD TIE BLK (SUTURE) IMPLANT
SUT VIC AB 3-0 SH 27 (SUTURE)
SUT VIC AB 3-0 SH 27X BRD (SUTURE) IMPLANT
SUT VICRYL 3-0 CR8 SH (SUTURE) ×4 IMPLANT
SYR 10ML LL (SYRINGE) IMPLANT
SYR 5ML LL (SYRINGE) ×4 IMPLANT
SYR CONTROL 10ML LL (SYRINGE) ×8 IMPLANT
TOWEL GREEN STERILE FF (TOWEL DISPOSABLE) ×8 IMPLANT
TRACER MAGTRACE VIAL (MISCELLANEOUS) ×4 IMPLANT
TRAY FAXITRON CT DISP (TRAY / TRAY PROCEDURE) ×4 IMPLANT
TUBE CONNECTING 20X1/4 (TUBING) ×4 IMPLANT
YANKAUER SUCT BULB TIP NO VENT (SUCTIONS) ×4 IMPLANT

## 2021-01-14 NOTE — Anesthesia Postprocedure Evaluation (Signed)
Anesthesia Post Note  Patient: Carmen [name]  Procedure(s) Performed: LEFT BREAST LUMPECTOMY WITH RADIOACTIVE SEED AND SENTINEL LYMPH NODE BIOPSY (Left: Breast) INSERTION PORT-A-CATH (Right: Chest) EXCISION LEFT CHEST WALL LIPOMA (Left: Chest)     Patient location during evaluation: PACU Anesthesia Type: General Level of consciousness: awake and alert and oriented Pain management: pain level controlled Vital Signs Assessment: post-procedure vital signs reviewed and stable Respiratory status: spontaneous breathing, nonlabored ventilation and respiratory function stable Cardiovascular status: blood pressure returned to baseline and stable Postop Assessment: no apparent nausea or vomiting Anesthetic complications: no   No notable events documented.  Last Vitals:  Vitals:   01/14/21 1200 01/14/21 1220  BP: (!) 137/56 (!) 131/52  Pulse: 76 79  Resp: 16 16  Temp:  36.4 C  SpO2: 94% 94%    Last Pain:  Vitals:   01/14/21 1220  TempSrc:   PainSc: 0-No pain                 [place] A.

## 2021-01-14 NOTE — Transfer of Care (Signed)
Immediate Anesthesia Transfer of Care Note  Patient: Carmen [name]  Procedure(s) Performed: LEFT BREAST LUMPECTOMY WITH RADIOACTIVE SEED AND SENTINEL LYMPH NODE BIOPSY (Left: Breast) INSERTION PORT-A-CATH (Right: Chest) EXCISION LEFT CHEST WALL LIPOMA (Left: Chest)  Patient Location: PACU  Anesthesia Type:General and Regional  Level of Consciousness: drowsy  Airway & Oxygen Therapy: Patient Spontanous Breathing and Patient connected to face mask oxygen  Post-op Assessment: Report given to RN and Post -op Vital signs reviewed and stable  Post vital signs: Reviewed and stable  Last Vitals:  Vitals Value Taken Time  BP 134/49 01/14/21 1131  Temp    Pulse 70 01/14/21 1133  Resp 13 01/14/21 1133  SpO2 98 % 01/14/21 1133  Vitals shown include unvalidated device data.  Last Pain:  Vitals:   01/14/21 0750  TempSrc: Oral  PainSc: 0-No pain         Complications: No notable events documented.

## 2021-01-14 NOTE — Discharge Instructions (Addendum)
  Post Anesthesia Home Care Instructions  Activity: Get plenty of rest for the remainder of the day. A responsible individual must stay with you for 24 hours following the procedure.  For the next 24 hours, DO NOT: -Drive a car -Paediatric nurse -Drink alcoholic beverages -Take any medication unless instructed by your physician -Make any legal decisions or sign important papers.  Meals: Start with liquid foods such as gelatin or soup. Progress to regular foods as tolerated. Avoid greasy, spicy, heavy foods. If nausea and/or vomiting occur, drink only clear liquids until the nausea and/or vomiting subsides. Call your physician if vomiting continues.  Special Instructions/Symptoms: Your throat may feel dry or sore from the anesthesia or the breathing tube placed in your throat during surgery. If this causes discomfort, gargle with warm salt water. The discomfort should disappear within 24 hours.  If you had a scopolamine patch placed behind your ear for the management of post- operative nausea and/or vomiting:  1. The medication in the patch is effective for 72 hours, after which it should be removed.  Wrap patch in a tissue and discard in the trash. Wash hands thoroughly with soap and water. 2. You may remove the patch earlier than 72 hours if you experience unpleasant side effects which may include dry mouth, dizziness or visual disturbances. 3. Avoid touching the patch. Wash your hands with soap and water after contact with the patch.     No Tylenol until after 2pm today.

## 2021-01-14 NOTE — Anesthesia Procedure Notes (Signed)
Procedure Name: LMA Insertion Date/Time: 01/14/2021 9:26 AM Performed by: Ezequiel Kayser, CRNA Pre-anesthesia Checklist: Patient identified, Emergency Drugs available, Suction available and Patient being monitored Patient Re-evaluated:Patient Re-evaluated prior to induction Oxygen Delivery Method: Circle System Utilized Preoxygenation: Pre-oxygenation with 100% oxygen Induction Type: IV induction Ventilation: Mask ventilation without difficulty LMA: LMA inserted LMA Size: 4.0 Number of attempts: 1 Airway Equipment and Method: Bite block Placement Confirmation: positive ETCO2 Tube secured with: Tape Dental Injury: Teeth and Oropharynx as per pre-operative assessment

## 2021-01-14 NOTE — Progress Notes (Signed)
Will follow for path results.   Oncology Nurse Navigator Documentation  Oncology Nurse Navigator Flowsheets 01/14/2021  Abnormal Finding Date -  Confirmed Diagnosis Date -  Diagnosis Status -  Planned Course of Treatment -  Phase of Treatment Surgery  Expected Surgery Date -  Surgery Actual Start Date: 01/14/2021  Navigator Follow Up Date: 01/17/2021  Navigator Follow Up Reason: Pathology  Navigator Location [facility]  Referral Date to RadOnc/MedOnc -  Navigator Encounter Type Appt/Treatment Plan Review  Telephone -  Treatment Initiated Date 01/14/2021  Patient Visit Type MedOnc  Treatment Phase Active Tx  Barriers/Navigation Needs Coordination of Care;Education  Education -  Interventions None Required  Acuity Level 2-Minimal Needs (1-2 Barriers Identified)  Coordination of Care -  Education Method -  Support Groups/Services Friends and Family  Time Spent with Patient 15

## 2021-01-14 NOTE — Anesthesia Procedure Notes (Signed)
Anesthesia Regional Block: Pectoralis block   Pre-Anesthetic Checklist: , timeout performed,  Correct Patient, Correct Site, Correct Laterality,  Correct Procedure, Correct Position, site marked,  Risks and benefits discussed,  Surgical consent,  Pre-op evaluation,  At surgeon's request and post-op pain management  Laterality: Left  Prep: chloraprep       Needles:  Injection technique: Single-shot  Needle Type: Stimiplex     Needle Length: 9cm  Needle Gauge: 21     Additional Needles:   Procedures:,,,, ultrasound used (permanent image in chart),,    Narrative:  Start time: 01/14/2021 8:44 AM End time: 01/14/2021 8:49 AM Injection made incrementally with aspirations every 5 mL.  Performed by: Personally  Anesthesiologist: Lynda Rainwater, MD

## 2021-01-14 NOTE — Anesthesia Preprocedure Evaluation (Signed)
Anesthesia Evaluation  Patient identified by MRN, date of birth, ID band Patient awake    Reviewed: Allergy & Precautions, NPO status , Patient's Chart, lab work & pertinent test results  History of Anesthesia Complications (+) PONV  Airway Mallampati: II  TM Distance: >3 FB Neck ROM: Full    Dental no notable dental hx.    Pulmonary neg pulmonary ROS,    Pulmonary exam normal breath sounds clear to auscultation       Cardiovascular hypertension, Pt. on medications + CAD and + Past MI  Normal cardiovascular exam Rhythm:Regular Rate:Normal     Neuro/Psych Depression negative neurological ROS  negative psych ROS   GI/Hepatic negative GI ROS, Neg liver ROS,   Endo/Other  diabetes, Type 2  Renal/GU Renal InsufficiencyRenal disease  negative genitourinary   Musculoskeletal  (+) Arthritis , Osteoarthritis,    Abdominal (+) + obese,   Peds negative pediatric ROS (+)  Hematology negative hematology ROS (+)   Anesthesia Other Findings   Reproductive/Obstetrics negative OB ROS                             Anesthesia Physical  Anesthesia Plan  ASA: III  Anesthesia Plan: General   Post-op Pain Management:  Regional for Post-op pain   Induction: Intravenous  PONV Risk Score and Plan: 4 or greater and Ondansetron, Treatment may vary due to age or medical condition, Dexamethasone, Midazolam and Droperidol  Airway Management Planned: LMA  Additional Equipment:   Intra-op Plan:   Post-operative Plan: Extubation in OR  Informed Consent: I have reviewed the patients History and Physical, chart, labs and discussed the procedure including the risks, benefits and alternatives for the proposed anesthesia with the patient or authorized representative who has indicated his/her understanding and acceptance.     Dental advisory given  Plan Discussed with: CRNA and Surgeon  Anesthesia Plan  Comments:         Anesthesia Quick Evaluation

## 2021-01-14 NOTE — Progress Notes (Signed)
Assisted Dr. Miller with left, ultrasound guided, pectoralis block. Side rails up, monitors on throughout procedure. See vital signs in flow sheet. Tolerated Procedure well. 

## 2021-01-14 NOTE — Op Note (Signed)
01/14/2021  11:25 AM  PATIENT:  Carmen [name]  80 y.o. female  PRE-OPERATIVE DIAGNOSIS:  LEFT BREAST CANCER  POST-OPERATIVE DIAGNOSIS:  LEFT BREAST CANCER  PROCEDURE:  Procedure(s): LEFT BREAST LUMPECTOMY WITH RADIOACTIVE SEED LOCALIZATION AND DEEP LEFT AXILLARY SENTINEL LYMPH NODE BIOPSY (Left) INSERTION PORT-A-CATH (Right) EXCISION LEFT CHEST WALL LIPOMA (Left)  SURGEON:  Surgeon(s) and Role:    * Jovita Kussmaul, MD - Primary  PHYSICIAN ASSISTANT:   ASSISTANTS: none   ANESTHESIA:   local and general  EBL:  5 mL   BLOOD ADMINISTERED:none  DRAINS: none   LOCAL MEDICATIONS USED:  MARCAINE     SPECIMEN:  Source of Specimen:  left breast tissue and sentinel node x 2, left chest wall lipoma  DISPOSITION OF SPECIMEN:  PATHOLOGY  COUNTS:  YES  TOURNIQUET:  * No tourniquets in log *  DICTATION: .Dragon Dictation  After informed consent was obtained the patient was brought to the operating room and placed in the supine position on the operating table.  After adequate induction of general anesthesia a roll was placed between the patient's shoulder blades to extend the shoulder slightly.  The patient's bilateral neck, chest, breast, and axillary areas were prepped with ChloraPrep, allowed to dry, and draped in usual sterile manner.  An appropriate timeout was performed.  At this point 2 cc of iron oxide mag trace was injected in the left subareolar plexus.  Attention was first turned to the right chest wall for placement of the port.  The patient was placed in Trendelenburg position.  The area lateral to the bend of the clavicle in the right chest wall was infiltrated with quarter percent Marcaine.  A large bore needle from the Port-A-Cath kit was used to try to slide beneath the bend of the clavicle heading towards the sternal notch.  I was not able to access the space between the clavicle and the ribs so I elected to move up to the neck.  I was then able to access the right  internal jugular vein without difficulty with the needle.  The wire was fed through the needle using the Seldinger technique without difficulty.  The wire was confirmed in the central venous system using real-time fluoroscopy.  Next a small incision was made at the wire entry site in the neck and a another small incision was made on the right chest wall lateral to the bend of the clavicle.  The incision on the chest wall was carried through the skin and subcutaneous tissue sharply with the electrocautery.  A subcutaneous pocket was created by blunt finger dissection inferior to the incision.  Next the 2 incisions were connected by blunt hemostat dissection.  A tendon passer was placed across the tunnel.  The tendon passer was used to bring the tubing through the tunnel.  The tubing was then placed on the reservoir and the reservoir was placed in the pocket.  The length of the tubing was estimated using real-time fluoroscopy and cut to the appropriate length.  Next a sheath and dilator were fed over the wire using the Seldinger technique without difficulty.  The dilator and wire were removed from the patient.  The tubing was fed through the sheath as far as it would go and then held in place while the sheath was gently cracked and separated.  Another real-time fluoroscopy image showed the tip of the catheter to be in the distal superior vena cava.  The tubing was then permanently anchored to the reservoir.  The reservoir was anchored in the pocket with two 2-0 Prolene stitches the port was then aspirated and it aspirated blood easily.  The port was then flushed initially with a dilute heparin solution and then with a more concentrated heparin solution.  The incision at the neck was closed with an interrupted 4-0 Monocryl subcuticular stitch.  The subcutaneous tissue was closed over the port with interrupted 3-0 Vicryl stitches.  The skin was closed with a running 4-0 Monocryl subcuticular stitch.  Attention was then  turned to the left chest wall.  The area around the lipoma was infiltrated with quarter percent Marcaine.  A small transversely oriented incision was made with a 15 blade knife.  The lipoma was separated from the rest of the subcutaneous tissue by blunt hemostat dissection.  The lipoma was removed.  It measured about 2 cm.  The lipoma was sent to pathology for further evaluation.  Hemostasis was achieved using the Bovie electrocautery.  The incision was closed with interrupted 4-0 Monocryl subcuticular stitches.  Attention was then turned to the left breast.  The neoprobe was set to I-125 in the area of radioactivity in the lower outer quadrant of the left breast was readily identified.  The area around this was infiltrated with quarter percent Marcaine.  An elliptical incision was made overlying the area of radioactivity with a 15 blade knife.  The incision was carried through the skin and subcutaneous tissue sharply with the electrocautery.  Dissection was then carried widely around the radioactive seed and all the way to the chest wall while checking the area of radioactivity frequently.  Once the specimen was removed it was oriented with the appropriate paint colors.  A specimen radiograph was obtained that showed the clip and seed to be near the center of the specimen.  The specimen was then sent to pathology for further evaluation.  Hemostasis was achieved using the Bovie electrocautery.  The wound was irrigated with saline and infiltrated with more quarter percent Marcaine.  The cavity was marked with clips.  The deep layer of the incision was then closed with layers of interrupted 3-0 Vicryl stitches.  The skin was closed with a running 4-0 Monocryl subcuticular stitch.  Attention was then turned to the left axilla.  The mag trace device was used to identify a signal in the left axilla.  The area overlying this was infiltrated with quarter percent Marcaine.  A small transversely oriented incision was made  with a 15 blade knife.  The incision was carried through the skin and subcutaneous tissue sharply with the electrocautery until the deep left axillary space was entered.  The mag trace was used to direct blunt hemostat dissection.  I was able to identify 2 hot lymph nodes.  Both of these nodes were excised sharply with the electrocautery and the surrounding small vessels and lymphatics were controlled with clips.  These were sent as sentinel nodes numbers 1 and 2.  Hemostasis was achieved using the Bovie electrocautery.  The deep layer of the incision was then closed with interrupted 3-0 Vicryl stitches.  The skin was then closed with a running 4-0 Monocryl subcuticular stitch.  Dermabond dressings were applied.  The patient tolerated the procedure well.  At the end of the case all needle sponge and instrument counts were correct.  The patient was then awakened and taken to recovery in stable condition.  PLAN OF CARE: Discharge to home after PACU  PATIENT DISPOSITION:  PACU - hemodynamically stable.   Delay  start of Pharmacological VTE agent (>24hrs) due to surgical blood loss or risk of bleeding: not applicable

## 2021-01-14 NOTE — Interval H&P Note (Signed)
History and Physical Interval Note:  01/14/2021 8:49 AM  Carmen [name]  has presented today for surgery, with the diagnosis of LEFT BREAST CANCER.  The various methods of treatment have been discussed with the patient and family. After consideration of risks, benefits and other options for treatment, the patient has consented to  Procedure(s): LEFT BREAST LUMPECTOMY WITH RADIOACTIVE SEED AND SENTINEL LYMPH NODE BIOPSY (Left) INSERTION PORT-A-CATH (N/A) as a surgical intervention.  The patient's history has been reviewed, patient examined, no change in status, stable for surgery.  I have reviewed the patient's chart and labs.  Questions were answered to the patient's satisfaction.     Autumn Messing III

## 2021-01-14 NOTE — H&P (Signed)
REFERRING PHYSICIAN: Claudina Lick, MD  PROVIDER: Landry Corporal, MD  MRN: [id number] DOB: 09/10/1940 Subjective  Chief Complaint: No chief complaint on file.   History of Present Illness: Carmen [name] is a 80 y.o. female who is seen today as an office consultation at the request of Dr. Truman Hayward for evaluation of No chief complaint on file. .   We are asked to see the patient in consultation by Dr. Robbi Garter to evaluate her for a new left breast cancer. The patient is a 80 year old white female who recently went for routine screening mammogram. At that time she was found to have a 1.9 cm mass on the lower outer quadrant of the left breast and 1 abnormal lymph node. The lymph node was biopsied and came back negative. The mass was biopsied and came back as an invasive breast cancer that was triple negative with a Ki-67 of 40%. She does have a significant heart history and is followed by Dr. Debara Pickett. She does not smoke. She also has hemochromatosis and is followed by Dr. Marin Olp  Review of Systems: A complete review of systems was obtained from the patient. I have reviewed this information and discussed as appropriate with the patient. See HPI as well for other ROS.  ROS   Medical History: History reviewed. No pertinent past medical history.  Patient Active Problem List  Diagnosis   Malignant neoplasm of lower-outer quadrant of left breast of female, estrogen receptor negative (CMS-HCC)   History reviewed. No pertinent surgical history.   Not on File  No current outpatient medications on file prior to visit.   No current facility-administered medications on file prior to visit.   History reviewed. No pertinent family history.   Social History   Tobacco Use  Smoking Status Not on file  Smokeless Tobacco Not on file    Social History   Socioeconomic History   Marital status: Unknown   Objective:  There were no vitals filed for this visit.  There is no height or weight  on file to calculate BMI.  Physical Exam Vitals reviewed.  Constitutional:  General: She is not in acute distress. Appearance: Normal appearance.  HENT:  Head: Normocephalic and atraumatic.  Right Ear: External ear normal.  Left Ear: External ear normal.  Nose: Nose normal.  Mouth/Throat:  Mouth: Mucous membranes are moist.  Pharynx: Oropharynx is clear.  Eyes:  General: No scleral icterus. Extraocular Movements: Extraocular movements intact.  Conjunctiva/sclera: Conjunctivae normal.  Pupils: Pupils are equal, round, and reactive to light.  Cardiovascular:  Rate and Rhythm: Normal rate and regular rhythm.  Pulses: Normal pulses.  Heart sounds: Normal heart sounds.  Pulmonary:  Effort: Pulmonary effort is normal. No respiratory distress.  Breath sounds: Normal breath sounds.  Abdominal:  General: Bowel sounds are normal.  Palpations: Abdomen is soft.  Tenderness: There is no abdominal tenderness.  Musculoskeletal:  General: No swelling, tenderness or deformity. Normal range of motion.  Cervical back: Normal range of motion and neck supple.  Skin: General: Skin is warm and dry.  Coloration: Skin is not jaundiced.  Neurological:  General: No focal deficit present.  Mental Status: She is alert and oriented to person, place, and time.  Psychiatric:  Mood and Affect: Mood normal.  Behavior: Behavior normal.   Breast: There is a 2 cm palpable bruise in the lower outer quadrant of the left breast. There is no other palpable mass in either breast. There is no palpable axillary, supraclavicular, or cervical lymphadenopathy.  Labs, Imaging and Diagnostic Testing:  Assessment and Plan:  Diagnoses and all orders for this visit:  Malignant neoplasm of lower-outer quadrant of left breast of female, estrogen receptor negative (CMS-HCC)    The patient appears to have a 1.9 cm cancer in the lower outer quadrant of the left breast with negative nodes. I have discussed with her the  different options for treatment and at this point she favors breast conservation which I feel is very reasonable. She is a good candidate for sentinel node biopsy as well. I will likely plan for a radioactive seed localized lumpectomy in case the bruise disappears. I have discussed with her in detail the risks and benefits of the operation as well as some of the technical aspects including the use of a radioactive seed and the need for a possible port given that she has triple negative and she understands and wishes to proceed. We will refer her to medical and radiation oncology to discuss adjuvant therapy. We will refer her to physical therapy for preoperative lymphedema testing. We will obtain cardiac clearance and begin surgical planning.

## 2021-01-15 ENCOUNTER — Encounter (HOSPITAL_BASED_OUTPATIENT_CLINIC_OR_DEPARTMENT_OTHER): Payer: Self-pay | Admitting: General Surgery

## 2021-01-16 LAB — SURGICAL PATHOLOGY

## 2021-01-17 ENCOUNTER — Encounter: Payer: Self-pay | Admitting: *Deleted

## 2021-01-17 NOTE — Progress Notes (Signed)
Oncology Nurse Navigator Documentation  Oncology Nurse Navigator Flowsheets 01/17/2021  Abnormal Finding Date -  Confirmed Diagnosis Date -  Diagnosis Status -  Planned Course of Treatment -  Phase of Treatment -  Expected Surgery Date -  Surgery Actual Start Date: -  Navigator Follow Up Date: 02/07/2021  Navigator Follow Up Reason: Follow-up Appointment  Navigator Location [facility]  Referral Date to RadOnc/MedOnc -  Navigator Encounter Type Pathology Review  Telephone -  Treatment Initiated Date -  Patient Visit Type MedOnc  Treatment Phase Active Tx  Barriers/Navigation Needs Coordination of Care;Education  Education -  Interventions None Required  Acuity Level 2-Minimal Needs (1-2 Barriers Identified)  Coordination of Care -  Education Method -  Support Groups/Services Friends and Family  Time Spent with Patient 15

## 2021-01-20 ENCOUNTER — Other Ambulatory Visit: Payer: Self-pay

## 2021-01-20 ENCOUNTER — Encounter: Payer: Self-pay | Admitting: Family Medicine

## 2021-01-20 ENCOUNTER — Ambulatory Visit (INDEPENDENT_AMBULATORY_CARE_PROVIDER_SITE_OTHER): Payer: Medicare Other | Admitting: Family Medicine

## 2021-01-20 VITALS — BP 121/58 | HR 67 | Temp 97.4°F | Ht 63.0 in | Wt 179.4 lb

## 2021-01-20 DIAGNOSIS — E782 Mixed hyperlipidemia: Secondary | ICD-10-CM | POA: Diagnosis not present

## 2021-01-20 DIAGNOSIS — Z23 Encounter for immunization: Secondary | ICD-10-CM

## 2021-01-20 DIAGNOSIS — E1165 Type 2 diabetes mellitus with hyperglycemia: Secondary | ICD-10-CM | POA: Insufficient documentation

## 2021-01-20 DIAGNOSIS — C50912 Malignant neoplasm of unspecified site of left female breast: Secondary | ICD-10-CM | POA: Diagnosis not present

## 2021-01-20 DIAGNOSIS — Z789 Other specified health status: Secondary | ICD-10-CM

## 2021-01-20 DIAGNOSIS — E1159 Type 2 diabetes mellitus with other circulatory complications: Secondary | ICD-10-CM | POA: Diagnosis not present

## 2021-01-20 DIAGNOSIS — I152 Hypertension secondary to endocrine disorders: Secondary | ICD-10-CM

## 2021-01-20 MED ORDER — METOPROLOL TARTRATE 25 MG PO TABS: ORAL_TABLET | ORAL | 3 refills | Status: DC

## 2021-01-20 MED ORDER — ICOSAPENT ETHYL 1 G PO CAPS: ORAL_CAPSULE | ORAL | 3 refills | Status: DC

## 2021-01-20 MED ORDER — OLMESARTAN MEDOXOMIL 20 MG PO TABS: 20.0000 mg | ORAL_TABLET | Freq: Every day | ORAL | 3 refills | Status: DC

## 2021-01-20 NOTE — Progress Notes (Signed)
Subjective:  Patient ID: Carmen [name],  female    DOB: 1940-09-11  Age: 80 y.o.    CC: Medical Management of Chronic Issues   HPI [name] presents for  follow-up of hypertension. Patient has no history of headache chest pain or shortness of breath or recent cough. Patient also denies symptoms of TIA such as numbness weakness lateralizing. Patient denies side effects from medication. States taking it regularly.  Patient also  in for follow-up of elevated cholesterol. Doing well without complaints on current medication. Denies side effects  including myalgia and arthralgia and nausea. Also in today for liver function testing. Currently no chest pain, shortness of breath or other cardiovascular related symptoms noted.  Follow-up of diabetes. Patient does check blood sugar at home.Following with Endocrine. Recent A1c 6.5. Patient denies symptoms such as excessive hunger or urinary frequency, excessive hunger, nausea No significant hypoglycemic spells noted. Medications reviewed. Pt reports taking them regularly. Pt. denies complication/adverse reaction today.   Pt. Had breast lumpectomy plus sentinel node harvest last week. Bx report attached. Reviewed with pt. She will start radiation and follow with chemo soon.    History [name] has a past medical history of Arthritis, Asymptomatic PVCs, Bilateral shoulder pain, CAD (coronary artery disease), Cancer ([place]) (11/2015), Chronic renal insufficiency, stage 3 (moderate) ([place]), Diabetes mellitus without complication ([place]), Diverticulosis, Endometrial polyp, External hemorrhoids, Gout, Hemochromatosis, hereditary ([place]) (11/24/2012), History of anemia, History of duodenal ulcer (1990), Hyperlipidemia, Hypertension, Internal hemorrhoids, Jaundice, Myocardial infarction ([place]) (2007), Neuromuscular disorder ([place]), PMB (postmenopausal bleeding), PONV (postoperative nausea and vomiting), Prolapse of female pelvic organs, Seizures ([place]), Stage  I breast cancer, left ([place]) (12/16/2020), Tick bite (08/12/2017), Type 2 diabetes mellitus with hyperglycemia, without long-term current use of insulin ([place]) (01/27/2019), and Vitamin D deficiency.   She has a past surgical history that includes Lipoma excision; Breast surgery; Cardiac catheterization (06/03/2005); Colonoscopy (11/28/2001); Upper gi endoscopy (01/20/1989); Coronary artery bypass graft (2007); Dilatation & curettage/hysteroscopy with myosure (N/A, 09/28/2017); Breast lumpectomy with radioactive seed and sentinel lymph node biopsy (Left, 01/14/2021); Portacath placement (Right, 01/14/2021); and Lipoma excision (Left, 01/14/2021).   Her family history includes Arthritis in her sister, sister, and sister; Cancer (age of onset: 22) in her sister; Diabetes in her brother, brother, brother, and father; Diverticulitis in her sister; Hemachromatosis in her brother, sister, sister, and sister; Hypertension in her father and mother; Lung cancer in her brother; Lupus in her sister; Neuropathy in her mother; Stroke in her father and mother.She reports that she has never smoked. She has never used smokeless tobacco. She reports current alcohol use. She reports that she does not use drugs.  Current Outpatient Medications on File Prior to Visit  Medication Sig Dispense Refill   allopurinol (ZYLOPRIM) 100 MG tablet TAKE  (1)  TABLET TWICE A DAY. 180 tablet 0   aspirin 81 MG tablet Take 81 mg by mouth daily.     colchicine 0.6 MG tablet TAKE (1) TABLET DAILY AS NEEDED. 30 tablet 1   diclofenac Sodium (VOLTAREN) 1 % GEL APPLY 4 GRAMS TO AFFECTED AREA(S) 4 TIMES A DAY AS NEEDED 100 g 0   fluticasone (FLONASE) 50 MCG/ACT nasal spray SPRAY 1 SPRAY IN EACH NOSTRIL ONCE DAILY. (Patient taking differently: SPRAY 1 SPRAY IN EACH NOSTRIL ONCE DAILY PRN.) 16 g 4   furosemide (LASIX) 20 MG tablet TAKE 1 TABLET DAILY 30 tablet 0   glipiZIDE (GLUCOTROL) 10 MG tablet Take 2 tablets (20 mg total) by mouth daily before  breakfast AND 1 tablet (10 mg total) daily before supper. 270 tablet 3   glucose blood (ONE TOUCH ULTRA TEST) test strip CHECK BLOOD SUGAR 2 TIMES A DAY 100 each prn   HYDROcodone-acetaminophen (NORCO/VICODIN) 5-325 MG tablet Take 1 tablet by mouth every 6 (six) hours as needed for moderate pain or severe pain. 15 tablet 0   Lancets Misc. (ACCU-CHEK FASTCLIX LANCET) KIT Use to check Blood Sugars 1 kit 0   meclizine (ANTIVERT) 12.5 MG tablet Take 1 tablet (12.5 mg total) by mouth 3 (three) times daily as needed for dizziness. 30 tablet 5   PRALUENT 150 MG/ML SOAJ Inject 1 Dose into the skin every 14 (fourteen) days. 2 mL 11   pregabalin (LYRICA) 50 MG capsule TAKE ONE CAPSULE BY MOUTH TWICE DAILY 180 capsule 1   RESTASIS 0.05 % ophthalmic emulsion Place 2 drops into both eyes 2 (two) times daily as needed.     Semaglutide (RYBELSUS) 7 MG TABS Take 1 tablet by mouth daily before breakfast. 90 tablet 3   Vitamin D, Ergocalciferol, (DRISDOL) 1.25 MG (50000 UT) CAPS capsule Take 1 capsule (50,000 Units total) by mouth 2 (two) times a week. (Patient taking differently: Take 10,000 Units by mouth 3 (three) times a week.) 26 capsule 3   VOLTAREN 1 % GEL APPLY 4 GRAMS TO AFFECTED AREA(S) 4 TIMES A DAY AS NEEDED 100 g 0   No current facility-administered medications on file prior to visit.    ROS Review of Systems  Objective:  BP (!) 121/58   Pulse 67   Temp (!) 97.4 F (36.3 C)   Ht 5' 3"  (1.6 m)   Wt 179 lb 6.4 oz (81.4 kg)   SpO2 95%   BMI 31.78 kg/m   BP Readings from Last 3 Encounters:  01/20/21 (!) 121/58  01/14/21 (!) 131/52  12/26/20 (!) 122/58    Wt Readings from Last 3 Encounters:  01/20/21 179 lb 6.4 oz (81.4 kg)  01/14/21 177 lb (80.3 kg)  12/26/20 179 lb (81.2 kg)     Physical Exam  Diabetic Foot Exam - Simple   No data filed       Assessment & Plan:   [name] was seen today for medical management of chronic issues.  Diagnoses and all orders for this  visit:  Hypertension associated with diabetes ([place]) -     Cancel: CMP14+EGFR -     CBC with Differential/Platelet -     CMP14+EGFR -     Lipid panel  Mixed hyperlipidemia -     Cancel: Lipid panel -     icosapent Ethyl (VASCEPA) 1 g capsule; TAKE (2) CAPSULES TWICE DAILY. -     CBC with Differential/Platelet -     CMP14+EGFR -     Lipid panel  Type 2 diabetes mellitus with hyperglycemia, without long-term current use of insulin (HCC) -     Cancel: Bayer DCA Hb A1c Waived -     Cancel: CBC with Differential/Platelet -     CBC with Differential/Platelet -     CMP14+EGFR -     Lipid panel  Hemochromatosis, hereditary (HCC) -     Cancel: Iron, TIBC and Ferritin Panel -     CBC with Differential/Platelet -     CMP14+EGFR -     Lipid panel  Need for immunization against influenza -     Flu Vaccine QUAD High Dose(Fluad)  Statin intolerance  Stage I breast cancer, left (HCC)  Other orders -  metoprolol tartrate (LOPRESSOR) 25 MG tablet; TAKE  (1)  TABLET TWICE A DAY. -     olmesartan (BENICAR) 20 MG tablet; Take 1 tablet (20 mg total) by mouth daily.  I have changed Janayah E. Files's olmesartan. I am also having her maintain her aspirin, Accu-Chek FastClix Lancet, glucose blood, Vitamin D (Ergocalciferol), fluticasone, colchicine, Voltaren, Restasis, meclizine, Praluent, diclofenac Sodium, allopurinol, Rybelsus, glipiZIDE, pregabalin, furosemide, HYDROcodone-acetaminophen, icosapent Ethyl, and metoprolol tartrate.  Meds ordered this encounter  Medications   icosapent Ethyl (VASCEPA) 1 g capsule    Sig: TAKE (2) CAPSULES TWICE DAILY.    Dispense:  360 capsule    Refill:  3   metoprolol tartrate (LOPRESSOR) 25 MG tablet    Sig: TAKE  (1)  TABLET TWICE A DAY.    Dispense:  180 tablet    Refill:  3   olmesartan (BENICAR) 20 MG tablet    Sig: Take 1 tablet (20 mg total) by mouth daily.    Dispense:  90 tablet    Refill:  3     Follow-up: No follow-ups on  file.  Claretta Fraise, M.D.

## 2021-01-21 LAB — CBC WITH DIFFERENTIAL/PLATELET
Basophils Absolute: 0.1 10*3/uL (ref 0.0–0.2)
Basos: 1 %
EOS (ABSOLUTE): 0.6 10*3/uL — ABNORMAL HIGH (ref 0.0–0.4)
Eos: 5 %
Hematocrit: 42 % (ref 34.0–46.6)
Hemoglobin: 13.8 g/dL (ref 11.1–15.9)
Immature Grans (Abs): 0.1 10*3/uL (ref 0.0–0.1)
Immature Granulocytes: 1 %
Lymphocytes Absolute: 3.5 10*3/uL — ABNORMAL HIGH (ref 0.7–3.1)
Lymphs: 34 %
MCH: 31 pg (ref 26.6–33.0)
MCHC: 32.9 g/dL (ref 31.5–35.7)
MCV: 94 fL (ref 79–97)
Monocytes Absolute: 0.6 10*3/uL (ref 0.1–0.9)
Monocytes: 5 %
Neutrophils Absolute: 5.5 10*3/uL (ref 1.4–7.0)
Neutrophils: 54 %
Platelets: 163 10*3/uL (ref 150–450)
RBC: 4.45 x10E6/uL (ref 3.77–5.28)
RDW: 14.5 % (ref 11.7–15.4)
WBC: 10.2 10*3/uL (ref 3.4–10.8)

## 2021-01-21 LAB — CMP14+EGFR
ALT: 24 IU/L (ref 0–32)
AST: 31 IU/L (ref 0–40)
Albumin/Globulin Ratio: 2 (ref 1.2–2.2)
Albumin: 4.4 g/dL (ref 3.7–4.7)
Alkaline Phosphatase: 100 IU/L (ref 44–121)
BUN/Creatinine Ratio: 15 (ref 12–28)
BUN: 18 mg/dL (ref 8–27)
Bilirubin Total: <0.2 mg/dL (ref 0.0–1.2)
CO2: 26 mmol/L (ref 20–29)
Calcium: 9.5 mg/dL (ref 8.7–10.3)
Chloride: 105 mmol/L (ref 96–106)
Creatinine, Ser: 1.19 mg/dL — ABNORMAL HIGH (ref 0.57–1.00)
Globulin, Total: 2.2 g/dL (ref 1.5–4.5)
Glucose: 108 mg/dL — ABNORMAL HIGH (ref 70–99)
Potassium: 4.2 mmol/L (ref 3.5–5.2)
Sodium: 147 mmol/L — ABNORMAL HIGH (ref 134–144)
Total Protein: 6.6 g/dL (ref 6.0–8.5)
eGFR: 46 mL/min/{1.73_m2} — ABNORMAL LOW (ref >59)

## 2021-01-21 LAB — LIPID PANEL
Chol/HDL Ratio: 3.3 ratio (ref 0.0–4.4)
Cholesterol, Total: 128 mg/dL (ref 100–199)
HDL: 39 mg/dL — ABNORMAL LOW (ref >39)
LDL Chol Calc (NIH): 54 mg/dL (ref 0–99)
Triglycerides: 215 mg/dL — ABNORMAL HIGH (ref 0–149)
VLDL Cholesterol Cal: 35 mg/dL (ref 5–40)

## 2021-01-21 NOTE — Progress Notes (Signed)
Hello Velicia,  Your lab result is normal and/or stable.Some minor variations that are not significant are commonly marked abnormal, but do not represent any medical problem for you.  Best regards, Claretta Fraise, M.D.

## 2021-01-22 ENCOUNTER — Encounter: Payer: Self-pay | Admitting: *Deleted

## 2021-01-22 ENCOUNTER — Other Ambulatory Visit: Payer: Self-pay | Admitting: *Deleted

## 2021-01-22 ENCOUNTER — Telehealth: Payer: Self-pay | Admitting: Family Medicine

## 2021-01-22 NOTE — Telephone Encounter (Signed)
It sounds harmless. If it spreads further she should be seen. If it causes shortness of breath call EMS

## 2021-01-22 NOTE — Progress Notes (Signed)
Received a call from patient's daughter, Maudie Mercury [phone] asking for an update on her FMLA. Spoke to several staff members and no one can recall receiving it. Requested that it be resent to our fax, or my email address. Informed her that I would expedite completing papers as her deadline to get them returned is 02/03/2021.  Oncology Nurse Navigator Documentation  Oncology Nurse Navigator Flowsheets 01/22/2021  Abnormal Finding Date -  Confirmed Diagnosis Date -  Diagnosis Status -  Planned Course of Treatment -  Phase of Treatment -  Expected Surgery Date -  Surgery Actual Start Date: -  Navigator Follow Up Date: 02/07/2021  Navigator Follow Up Reason: Follow-up Appointment  Navigator Location [facility]  Referral Date to RadOnc/MedOnc -  Navigator Encounter Type Telephone  Telephone FMLA/Disability;Incoming Call  Treatment Initiated Date -  Patient Visit Type MedOnc  Treatment Phase Active Tx  Barriers/Navigation Needs Coordination of Care;Education  Education -  Interventions Disability/FMLA  Acuity Level 2-Minimal Needs (1-2 Barriers Identified)  Coordination of Care -  Education Method Verbal  Support Groups/Services Friends and Family  Time Spent with Patient 15

## 2021-01-22 NOTE — Telephone Encounter (Signed)
PATIENT AWARE

## 2021-01-22 NOTE — Telephone Encounter (Signed)
Rash from waistline up, mostly breast and abdomin area and some on arms, tiny red dots, no itching.

## 2021-01-27 ENCOUNTER — Encounter: Payer: Self-pay | Admitting: *Deleted

## 2021-01-27 NOTE — Progress Notes (Signed)
Received FMLA forms for patient's daughter, Oneita Hurt. Papers completed and given to provider to sign and return to requesting employer.  Oncology Nurse Navigator Documentation  Oncology Nurse Navigator Flowsheets 01/27/2021  Abnormal Finding Date -  Confirmed Diagnosis Date -  Diagnosis Status -  Planned Course of Treatment -  Phase of Treatment -  Expected Surgery Date -  Surgery Actual Start Date: -  Navigator Follow Up Date: 02/07/2021  Navigator Follow Up Reason: Follow-up Appointment  Navigator Location [facility]  Referral Date to RadOnc/MedOnc -  Navigator Encounter Type Letter/Fax/Email  Telephone FMLA/Disability  Treatment Initiated Date -  Patient Visit Type MedOnc  Treatment Phase Active Tx  Barriers/Navigation Needs Coordination of Care;Education  Education -  Interventions Disability/FMLA  Acuity Level 2-Minimal Needs (1-2 Barriers Identified)  Coordination of Care -  Education Method -  Support Groups/Services Friends and Family  Time Spent with Patient 30

## 2021-01-29 ENCOUNTER — Encounter: Payer: Self-pay | Admitting: *Deleted

## 2021-01-29 NOTE — Progress Notes (Signed)
Patient called to check in. She saw and was released by her surgeon. She sees [name] next week but she's worried she may not be ready to start her next steps of treatment. Assured her that Dr Marin Olp and her could discuss it next week and if she needed more time, he would likely give it to her.  She also mentions she has a dental appointment today to have a crown re-glued. She had a port placed. Explained to her, that going forward, anytime she has a dental procedure she will need prophylactic antibiotics. Today she will be fine without, since they are simply re-gluing a tooth, but if they need to do anything additional she will notify us so that we can send in a prescription.   Oncology Nurse Navigator Documentation  Oncology Nurse Navigator Flowsheets 01/29/2021  Abnormal Finding Date -  Confirmed Diagnosis Date -  Diagnosis Status -  Planned Course of Treatment -  Phase of Treatment -  Expected Surgery Date -  Surgery Actual Start Date: -  Navigator Follow Up Date: 02/07/2021  Navigator Follow Up Reason: Follow-up Appointment  Navigator Location [facility]  Referral Date to RadOnc/MedOnc -  Navigator Encounter Type Telephone  Telephone Medication Assistance;Incoming Call  Treatment Initiated Date -  Patient Visit Type MedOnc  Treatment Phase Active Tx  Barriers/Navigation Needs Coordination of Care;Education  Education Other  Interventions Education;Psycho-Social Support  Acuity Level 2-Minimal Needs (1-2 Barriers Identified)  Coordination of Care -  Education Method Verbal;Teach-back  Support Groups/Services Friends and Family  Time Spent with Patient 30

## 2021-01-31 ENCOUNTER — Telehealth: Payer: Medicare Other

## 2021-02-04 ENCOUNTER — Ambulatory Visit: Payer: Medicare Other | Attending: General Surgery

## 2021-02-04 ENCOUNTER — Other Ambulatory Visit: Payer: Self-pay

## 2021-02-04 DIAGNOSIS — R293 Abnormal posture: Secondary | ICD-10-CM | POA: Insufficient documentation

## 2021-02-04 DIAGNOSIS — M25612 Stiffness of left shoulder, not elsewhere classified: Secondary | ICD-10-CM | POA: Insufficient documentation

## 2021-02-04 DIAGNOSIS — R600 Localized edema: Secondary | ICD-10-CM | POA: Diagnosis not present

## 2021-02-04 DIAGNOSIS — C50912 Malignant neoplasm of unspecified site of left female breast: Secondary | ICD-10-CM | POA: Diagnosis not present

## 2021-02-04 DIAGNOSIS — Z171 Estrogen receptor negative status [ER-]: Secondary | ICD-10-CM | POA: Diagnosis not present

## 2021-02-04 NOTE — Patient Instructions (Addendum)
     [facility]  [address]  [place] [address]  [phone]  After Breast Cancer Class It is recommended you attend the ABC class to be educated on lymphedema risk reduction. This class is free of charge and lasts for 1 hour. It is a 1-time class. You are scheduled for Monday, November 21 at 11:00 and it is online.  Mateo Flow will email you a link the weekend before the class.  Scar massage: not yet.  We will instruct you after incisions are healed   Home exercise Program;Continue your 4 post-op exercises 2x/day   Follow up PT: It is recommended you return every 3 months for the first 3 years following surgery to be assessed on the SOZO machine for an L-Dex score. This helps prevent clinically significant lymphedema in 95% of patients. These follow up screens are 10 minute appointments that you are not billed for. You are scheduled for January 9 at 3:30

## 2021-02-04 NOTE — Therapy (Signed)
[facility] [place], [place], [address] Phone: [phone]   Fax:  [phone]  Physical Therapy Treatment  Patient Details  Name: Carmen [name] MRN: [id number] Date of Birth: 10-18-1940 Referring Provider (PT): Dr. Marlou Starks   Encounter Date: 02/04/2021   PT End of Session - 02/04/21 1644     Visit Number 2    Number of Visits 10    Date for PT Re-Evaluation 03/04/21    PT Start Time 1400    PT Stop Time 1452    PT Time Calculation (min) 52 min    Activity Tolerance Patient tolerated treatment well    Behavior During Therapy [facility] for tasks assessed/performed             Past Medical History:  Diagnosis Date   Arthritis    Asymptomatic PVCs    Bilateral shoulder pain    CAD (coronary artery disease)    Cancer ([place]) 11/2015   [id number]  right upper arm   Chronic renal insufficiency, stage 3 (moderate) ([place])    Diabetes mellitus without complication ([place])    Diverticulosis    Endometrial polyp    External hemorrhoids    Gout    Hemochromatosis, hereditary ([place]) 11/24/2012   History of anemia    History of duodenal ulcer 1990   Hyperlipidemia    Hypertension    Internal hemorrhoids    Jaundice    age 80 or 80   Myocardial infarction ([place]) 2007   Neuromuscular disorder ([place])    peripheral neuropathy   PMB (postmenopausal bleeding)    PONV (postoperative nausea and vomiting)    Prolapse of female pelvic organs    uses pessary   Seizures ([place])    had one at Dr. Antonieta Pert office after getting blood drawn   Stage I breast cancer, left ([place]) 12/16/2020   Tick bite 08/12/2017   had 3 bites   Type 2 diabetes mellitus with hyperglycemia, without long-term current use of insulin ([place]) 01/27/2019   Vitamin D deficiency     Past Surgical History:  Procedure Laterality Date   BREAST LUMPECTOMY WITH RADIOACTIVE SEED AND SENTINEL LYMPH NODE BIOPSY Left 01/14/2021   Procedure: LEFT BREAST LUMPECTOMY  WITH RADIOACTIVE SEED AND SENTINEL LYMPH NODE BIOPSY;  Surgeon: Jovita Kussmaul, MD;  Location: [facility];  Service: General;  Laterality: Left;   BREAST SURGERY     left breast lump--benign   CARDIAC CATHETERIZATION  06/03/2005   COLONOSCOPY  11/28/2001   CORONARY ARTERY BYPASS GRAFT  2007   x2 Dr. Roxan Hockey, [place] to LAD, SVG to [place] N/A 09/28/2017   Procedure: [place];  Surgeon: Molli Posey, MD;  Location[facility];  Service: Gynecology;  Laterality: N/A;   LIPOMA EXCISION     back   LIPOMA EXCISION Left 01/14/2021   Procedure: EXCISION LEFT CHEST WALL LIPOMA;  Surgeon: Jovita Kussmaul, MD;  Location: [facility];  Service: General;  Laterality: Left;   PORTACATH PLACEMENT Right 01/14/2021   Procedure: INSERTION PORT-A-CATH;  Surgeon: Jovita Kussmaul, MD;  Location: [facility];  Service: General;  Laterality: Right;   UPPER GI ENDOSCOPY  01/20/1989    There were no vitals filed for this visit.   Subjective Assessment - 02/04/21 1403     Subjective I had the surgery and it went well. The LN's were negative. Got a  porta cath put in at the same time as my surgery. Feel like my shoulders are doing well.  I only took pain meds for 2 days. Under my arm is a little swollen.    Pertinent History Pt was diagnosed with Left Invasive Breast Cancer that is Triple Negative with a Ki67 of 40%.  She is s/p left lumpectomy with SLNB on 01/14/2021.Pt has hemochromatosis, DM,Melanoma arm, leg x 4, MI,open heart surgery,gout,Renal insufficiency    Patient Stated Goals Reassessment after surgery    Currently in Pain? No/denies    Pain Score 0-No pain    Multiple Pain Sites No                [facility] PT Assessment - 02/04/21 0001       Assessment   Medical Diagnosis Left Breast Cancer    Referring Provider (PT) Dr. Marlou Starks    Onset Date/Surgical  Date 01/14/21    Hand Dominance Right      Precautions   Precaution Comments lymphedema      Balance Screen   Has the patient fallen in the past 6 months No    Has the patient had a decrease in activity level because of a fear of falling?  No    Is the patient reluctant to leave their home because of a fear of falling?  No      Home Ecologist residence      Prior Function   Level of Independence Independent      Cognition   Overall Cognitive Status Within Functional Limits for tasks assessed      Observation/Other Assessments   Observations axillary incision and inferior breast incision healing with scabs still present.  Nipple black and blue, swelling noted inferior to axilla.  Left breast visibly smaller    Skin Integrity WNL except for healing incisions with small scabs      AROM   Left Shoulder Extension 65 Degrees    Left Shoulder Flexion 155 Degrees    Left Shoulder ABduction 175 Degrees    Left Shoulder Internal Rotation 65 Degrees    Left Shoulder External Rotation 95 Degrees               LYMPHEDEMA/ONCOLOGY QUESTIONNAIRE - 02/04/21 0001       Type   Cancer Type Invasive Breast Cancer      Surgeries   Lumpectomy Date 01/14/21    Sentinel Lymph Node Biopsy Date 01/14/21    Number Lymph Nodes Removed 0      Treatment   Active Chemotherapy Treatment --   pending   Past Chemotherapy Treatment No    Active Radiation Treatment --   pending?   Past Radiation Treatment No    Current Hormone Treatment No    Past Hormone Therapy No      What other symptoms do you have   Are you Having Heaviness or Tightness Yes    Are you having Pain No    Are you having pitting edema No    Is it Hard or Difficult finding clothes that fit No    Do you have infections No    Is there Decreased scar mobility No    Stemmer Sign No      Left Upper Extremity Lymphedema   10 cm Proximal to Olecranon Process 38.5 cm    Olecranon Process 29 cm     10 cm Proximal to Ulnar Styloid Process 24.2 cm    Just  Proximal to Ulnar Styloid Process 17.2 cm    At [facility] of 2nd Digit 6 cm                Quick Dash - 02/04/21 0001     Open a tight or new jar Severe difficulty    Do heavy household chores (wash walls, wash floors) No difficulty    Carry a shopping bag or briefcase Mild difficulty    Wash your back Mild difficulty    Use a knife to cut food No difficulty    Recreational activities in which you take some force or impact through your arm, shoulder, or hand (golf, hammering, tennis) Mild difficulty    During the past week, to what extent has your arm, shoulder or hand problem interfered with your normal social activities with family, friends, neighbors, or groups? Slightly    During the past week, to what extent has your arm, shoulder or hand problem limited your work or other regular daily activities Slightly    Arm, shoulder, or hand pain. None    Tingling (pins and needles) in your arm, shoulder, or hand None    Difficulty Sleeping No difficulty    DASH Score 18.18 %                             PT Education - 02/04/21 1643     Education Details reminded pt about ABC class Nov 21, and  next SOZO screen. Pt to continue post op exercises throughout radiation and to watch for breast swelling    Person(s) Educated Patient    Methods Explanation;Handout    Comprehension Verbalized understanding                 PT Long Term Goals - 02/04/21 1709       PT LONG TERM GOAL #1   Title Pt will achieve left shoulder AROM within 5-7 degrees of baselines ROM for ability to perform home activities    Time 4    Period Weeks    Status On-going    Target Date 03/04/21      PT LONG TERM GOAL #2   Title Pt will have decreased left axillary swelling by greater than 50%    Time 4    Period Weeks    Status New    Target Date 03/04/21      PT LONG TERM GOAL #3   Title Pt will be independent in self MLD  for left axillary/breast swelling    Time 4    Period Weeks    Status New    Target Date 03/04/21      PT LONG TERM GOAL #4   Title Pts quick dash will return to pre-surgical baseline of 11%    Time 4    Period Weeks    Status New    Target Date 03/04/21                   Plan - 02/04/21 1648     Clinical Impression Statement Pt is seen today for post op reassessment after having a left lumpectomy with SLNB on 01/14/2021 for triple negative breast Cancer. Shoulder AROM is progressing nicely and she will continue her 4 post op exercises.  She lacks 13 degrees of active flexion, and 11 degrees of ER but is otherwise doing very well.  There is no edema noted in the arm or breast, however there is  some inferior axillary swelling that may be a seroma.  It is non-tender. She is padding her bra with hankerchiefs and a foam pad covered with TG soft was made for inferior border of her bra to make it more comfortable for her. She will benefit from skilled therapy to address swelling in axillary region, and to instruct pt in MLD for swelling, as well as gaining the last few degrees of ROM.    Personal Factors and Comorbidities Comorbidity 3+    Comorbidities Invasive Breast CA pending Chemo and radiation,hemachromatosis, DM, renal insufficiency,CABG    Stability/Clinical Decision Making Stable/Uncomplicated    Clinical Decision Making Low    Rehab Potential Excellent    PT Frequency 2x / week    PT Duration 4 weeks    PT Treatment/Interventions ADLs/Self Care Home Management;Therapeutic exercise;Patient/family education;Manual techniques;Passive range of motion;Scar mobilization;Manual lymph drainage    PT Next Visit Plan Assess left  axillary swelling again and breast prn, perform MLD and instruct pt in MLD techniques, regain last few degrees of shoulder motion (may not require many visits)    PT Home Exercise Plan continue 4 post op exercises    Consulted and Agree with Plan of Care  Patient             Patient will benefit from skilled therapeutic intervention in order to improve the following deficits and impairments:  Decreased knowledge of precautions, Postural dysfunction, Increased edema, Decreased range of motion  Visit Diagnosis: Malignant neoplasm of left breast in female, estrogen receptor negative, unspecified site of breast (HCC)  Abnormal posture  Localized edema  Stiffness of left shoulder, not elsewhere classified     Problem List Patient Active Problem List   Diagnosis Date Noted   Type 2 diabetes mellitus with hyperglycemia, without long-term current use of insulin ([place]) 01/20/2021   Current moderate episode of major depressive disorder without prior episode ([place]) 12/26/2020   Coronary artery disease of bypass graft of native heart with stable angina pectoris ([place]) 12/26/2020   Stage I breast cancer, left ([place]) 12/16/2020   Type 2 diabetes mellitus with diabetic polyneuropathy, without long-term current use of insulin ([place]) 12/29/2019   Type 2 diabetes mellitus with stage 3b chronic kidney disease, without long-term current use of insulin ([place]) 01/27/2019   Chronic renal insufficiency, stage 3 (moderate) ([facility]) 09/10/2017   Chronic pain of both shoulders 04/27/2017   Vitamin D deficiency 01/28/2016   Non-insulin treated type 2 diabetes mellitus ([place]) 11/12/2015   Statin intolerance 08/18/2013   Neuromuscular disorder ([place])    Hemochromatosis, hereditary ([place]) 11/24/2012   S/P CABG x 2 08/09/2012   PVC's (premature ventricular contractions) 08/09/2012   Gout 06/20/2012   Hypertension associated with diabetes ([place]) 06/20/2012   HLD (hyperlipidemia) 06/20/2012   Arthritis 06/20/2012    Claris Pong, PT 02/04/2021, 5:13 PM  [facility] @ [place], [place], [address] Phone: [phone]   Fax:  [phone]  Name: JIHAN MELLETTE MRN: [id number] Date of Birth:  09-23-1940

## 2021-02-06 ENCOUNTER — Other Ambulatory Visit: Payer: Self-pay | Admitting: *Deleted

## 2021-02-06 DIAGNOSIS — C50912 Malignant neoplasm of unspecified site of left female breast: Secondary | ICD-10-CM

## 2021-02-07 ENCOUNTER — Other Ambulatory Visit: Payer: Self-pay

## 2021-02-07 ENCOUNTER — Telehealth: Payer: Self-pay | Admitting: *Deleted

## 2021-02-07 ENCOUNTER — Inpatient Hospital Stay (HOSPITAL_BASED_OUTPATIENT_CLINIC_OR_DEPARTMENT_OTHER): Payer: Medicare Other | Admitting: Hematology & Oncology

## 2021-02-07 ENCOUNTER — Inpatient Hospital Stay: Payer: Medicare Other | Attending: Hematology & Oncology

## 2021-02-07 ENCOUNTER — Other Ambulatory Visit: Payer: Self-pay | Admitting: *Deleted

## 2021-02-07 ENCOUNTER — Inpatient Hospital Stay: Payer: Medicare Other

## 2021-02-07 ENCOUNTER — Encounter: Payer: Self-pay | Admitting: Hematology & Oncology

## 2021-02-07 ENCOUNTER — Encounter: Payer: Self-pay | Admitting: *Deleted

## 2021-02-07 DIAGNOSIS — Z171 Estrogen receptor negative status [ER-]: Secondary | ICD-10-CM | POA: Insufficient documentation

## 2021-02-07 DIAGNOSIS — C50912 Malignant neoplasm of unspecified site of left female breast: Secondary | ICD-10-CM

## 2021-02-07 DIAGNOSIS — Z95828 Presence of other vascular implants and grafts: Secondary | ICD-10-CM

## 2021-02-07 DIAGNOSIS — Z7189 Other specified counseling: Secondary | ICD-10-CM | POA: Diagnosis not present

## 2021-02-07 DIAGNOSIS — Z800 Family history of malignant neoplasm of digestive organs: Secondary | ICD-10-CM | POA: Diagnosis not present

## 2021-02-07 DIAGNOSIS — Z801 Family history of malignant neoplasm of trachea, bronchus and lung: Secondary | ICD-10-CM | POA: Diagnosis not present

## 2021-02-07 HISTORY — DX: Other specified counseling: Z71.89

## 2021-02-07 LAB — CMP (CANCER CENTER ONLY)
ALT: 13 U/L (ref 0–44)
AST: 16 U/L (ref 15–41)
Albumin: 4.2 g/dL (ref 3.5–5.0)
Alkaline Phosphatase: 70 U/L (ref 38–126)
Anion gap: 8 (ref 5–15)
BUN: 28 mg/dL — ABNORMAL HIGH (ref 8–23)
CO2: 29 mmol/L (ref 22–32)
Calcium: 9.9 mg/dL (ref 8.9–10.3)
Chloride: 105 mmol/L (ref 98–111)
Creatinine: 1.22 mg/dL — ABNORMAL HIGH (ref 0.44–1.00)
GFR, Estimated: 45 mL/min — ABNORMAL LOW (ref >60)
Glucose, Bld: 171 mg/dL — ABNORMAL HIGH (ref 70–99)
Potassium: 3.9 mmol/L (ref 3.5–5.1)
Sodium: 142 mmol/L (ref 135–145)
Total Bilirubin: 0.4 mg/dL (ref 0.3–1.2)
Total Protein: 6.8 g/dL (ref 6.5–8.1)

## 2021-02-07 LAB — CBC WITH DIFFERENTIAL (CANCER CENTER ONLY)
Abs Immature Granulocytes: 0.04 10*3/uL (ref 0.00–0.07)
Basophils Absolute: 0.1 10*3/uL (ref 0.0–0.1)
Basophils Relative: 1 %
Eosinophils Absolute: 0.5 10*3/uL (ref 0.0–0.5)
Eosinophils Relative: 6 %
HCT: 39.6 % (ref 36.0–46.0)
Hemoglobin: 13.3 g/dL (ref 12.0–15.0)
Immature Granulocytes: 1 %
Lymphocytes Relative: 34 %
Lymphs Abs: 2.8 10*3/uL (ref 0.7–4.0)
MCH: 31.1 pg (ref 26.0–34.0)
MCHC: 33.6 g/dL (ref 30.0–36.0)
MCV: 92.5 fL (ref 80.0–100.0)
Monocytes Absolute: 0.5 10*3/uL (ref 0.1–1.0)
Monocytes Relative: 5 %
Neutro Abs: 4.5 10*3/uL (ref 1.7–7.7)
Neutrophils Relative %: 53 %
Platelet Count: 155 10*3/uL (ref 150–400)
RBC: 4.28 MIL/uL (ref 3.87–5.11)
RDW: 13.3 % (ref 11.5–15.5)
WBC Count: 8.3 10*3/uL (ref 4.0–10.5)
nRBC: 0 % (ref 0.0–0.2)

## 2021-02-07 LAB — IRON AND TIBC
Iron: 72 ug/dL (ref 41–142)
Saturation Ratios: 25 % (ref 21–57)
TIBC: 288 ug/dL (ref 236–444)
UIBC: 215 ug/dL (ref 120–384)

## 2021-02-07 LAB — FERRITIN: Ferritin: 59 ng/mL (ref 11–307)

## 2021-02-07 MED ORDER — AMOXICILLIN 500 MG PO TABS: 2000.0000 mg | ORAL_TABLET | Freq: Once | ORAL | 0 refills | Status: AC

## 2021-02-07 MED ORDER — HEPARIN SOD (PORK) LOCK FLUSH 100 UNIT/ML IV SOLN
500.0000 [IU] | Freq: Once | INTRAVENOUS | Status: AC
  Administered 2021-02-07: 500 [IU] via INTRAVENOUS

## 2021-02-07 MED ORDER — LIDOCAINE-PRILOCAINE 2.5-2.5 % EX CREA: 1.0000 "application " | TOPICAL_CREAM | CUTANEOUS | 0 refills | Status: DC | PRN

## 2021-02-07 MED ORDER — SODIUM CHLORIDE 0.9% FLUSH
10.0000 mL | Freq: Once | INTRAVENOUS | Status: AC
  Administered 2021-02-07: 10 mL via INTRAVENOUS

## 2021-02-07 NOTE — Patient Instructions (Signed)

## 2021-02-07 NOTE — Progress Notes (Signed)
Hematology and Oncology Follow Up Visit  Carmen [name] [id number] [birth year] 80 y.o. 02/07/2021   Principle Diagnosis:  Invasive ductal carcinoma of the left breast-stage IIA (T2N0M0) -- TRIPLE NEGATIVE Hemochromatosis (double heterozygote for C282Y and S65C mutations).  Current Therapy:         Phlebotomy to maintain ferritin less than 100 Adriamycin/Cytoxan --  start adjuvant therapy in 02/27/2021     Interim History:  [name] is back for follow-up.  Unfortunate, she does have breast cancer.  She did undergo a lumpectomy.  This was done on 01/14/2021.  The pathology report ([id number]) showed a 2.2 cm invasive ductal carcinoma.  All margins were negative.  She had 5 lymph nodes sampled and all were negative.  Her tumor, unfortunately, is TRIPLE NEGATIVE.  She has recovered quite nicely.  She comes in with her daughter.  She feels okay.  She still has little bit of stiffness in the left shoulder.  She has had no problems with cough or shortness of breath.  She has had no nausea or vomiting.  She has had no change in bowel or bladder habits.  Of note, she does have the hemochromatosis.  We have been following this for years.  So far, this really has not been a problem for her.  An early follow-up.  Looks like we now have a new problem.  Despite her age, she is still in great shape.  As such, I think she would be a candidate for adjuvant chemotherapy.  The question is whether or not she needs radiation therapy.  I know one of the margins was relatively close.  As such, she probably would be a candidate for radiation.  Currently, I would say her performance status is ECOG 1.      Medications:  Current Outpatient Medications:    allopurinol (ZYLOPRIM) 100 MG tablet, TAKE  (1)  TABLET TWICE A DAY., Disp: 180 tablet, Rfl: 0   aspirin 81 MG tablet, Take 81 mg by mouth daily., Disp: , Rfl:    Cholecalciferol (VITAMIN D3) 250 MCG (10000 UT) TABS, Take by mouth 3 (three)  times a week., Disp: , Rfl:    colchicine 0.6 MG tablet, TAKE (1) TABLET DAILY AS NEEDED., Disp: 30 tablet, Rfl: 1   diclofenac Sodium (VOLTAREN) 1 % GEL, APPLY 4 GRAMS TO AFFECTED AREA(S) 4 TIMES A DAY AS NEEDED, Disp: 100 g, Rfl: 0   furosemide (LASIX) 20 MG tablet, TAKE 1 TABLET DAILY, Disp: 30 tablet, Rfl: 0   glipiZIDE (GLUCOTROL) 10 MG tablet, Take 2 tablets (20 mg total) by mouth daily before breakfast AND 1 tablet (10 mg total) daily before supper., Disp: 270 tablet, Rfl: 3   glucose blood (ONE TOUCH ULTRA TEST) test strip, CHECK BLOOD SUGAR 2 TIMES A DAY, Disp: 100 each, Rfl: prn   icosapent Ethyl (VASCEPA) 1 g capsule, TAKE (2) CAPSULES TWICE DAILY., Disp: 360 capsule, Rfl: 3   Lancets Misc. (ACCU-CHEK FASTCLIX LANCET) KIT, Use to check Blood Sugars, Disp: 1 kit, Rfl: 0   metoprolol tartrate (LOPRESSOR) 25 MG tablet, TAKE  (1)  TABLET TWICE A DAY., Disp: 180 tablet, Rfl: 3   olmesartan (BENICAR) 20 MG tablet, Take 1 tablet (20 mg total) by mouth daily., Disp: 90 tablet, Rfl: 3   PRALUENT 150 MG/ML SOAJ, Inject 1 Dose into the skin every 14 (fourteen) days., Disp: 2 mL, Rfl: 11   pregabalin (LYRICA) 50 MG capsule, TAKE ONE CAPSULE BY MOUTH TWICE DAILY, Disp: 180 capsule, Rfl:  1   Semaglutide (RYBELSUS) 7 MG TABS, Take 1 tablet by mouth daily before breakfast., Disp: 90 tablet, Rfl: 3   amoxicillin (AMOXIL) 500 MG tablet, Take 4 tablets (2,000 mg total) by mouth once for 1 dose. Take 30 minutes prior to dental procedure., Disp: 4 tablet, Rfl: 0   fluticasone (FLONASE) 50 MCG/ACT nasal spray, SPRAY 1 SPRAY IN EACH NOSTRIL ONCE DAILY. (Patient not taking: Reported on 02/07/2021), Disp: 16 g, Rfl: 4   HYDROcodone-acetaminophen (NORCO/VICODIN) 5-325 MG tablet, Take 1 tablet by mouth every 6 (six) hours as needed for moderate pain or severe pain. (Patient not taking: Reported on 02/07/2021), Disp: 15 tablet, Rfl: 0   lidocaine-prilocaine (EMLA) cream, Apply 1 application topically as needed.,  Disp: 30 g, Rfl: 0   meclizine (ANTIVERT) 12.5 MG tablet, Take 1 tablet (12.5 mg total) by mouth 3 (three) times daily as needed for dizziness. (Patient not taking: Reported on 02/07/2021), Disp: 30 tablet, Rfl: 5   RESTASIS 0.05 % ophthalmic emulsion, Place 2 drops into both eyes 2 (two) times daily as needed. (Patient not taking: Reported on 02/07/2021), Disp: , Rfl:    VOLTAREN 1 % GEL, APPLY 4 GRAMS TO AFFECTED AREA(S) 4 TIMES A DAY AS NEEDED (Patient not taking: Reported on 02/07/2021), Disp: 100 g, Rfl: 0  Allergies:  Allergies  Allergen Reactions   Cymbalta [Duloxetine Hcl] Other (See Comments)    Profuse sweating   Farxiga [Dapagliflozin] Other (See Comments)    Dizziness   Januvia [Sitagliptin] Swelling   Meloxicam Nausea And Vomiting   Shellfish Allergy Other (See Comments)    Gout   Trulicity [Dulaglutide] Nausea Only   Crestor [Rosuvastatin] Other (See Comments)    Muscle aches on 5 mg daily and 10 mg twice weekly   Diclofenac Diarrhea   Lipitor [Atorvastatin] Other (See Comments)    Muscle aches   Livalo [Pitavastatin] Other (See Comments)        Sulfa Antibiotics Rash   Tape Other (See Comments)    Causes skin irritation   Zetia [Ezetimibe] Other (See Comments)    Muscle aches   Zocor [Simvastatin] Other (See Comments)    Muscle aches     Past Medical History, Surgical history, Social history, and Family History were reviewed and updated.  Review of Systems: Review of Systems  Constitutional: Negative.   HENT: Negative.    Eyes: Negative.   Respiratory: Negative.    Cardiovascular: Negative.   Gastrointestinal: Negative.   Genitourinary: Negative.   Musculoskeletal: Negative.   Skin: Negative.   Neurological: Negative.   Endo/Heme/Allergies: Negative.   Psychiatric/Behavioral: Negative.     Physical Exam:  weight is 179 lb 1.3 oz (81.2 kg). Her oral temperature is 97.9 F (36.6 C). Her blood pressure is 143/56 (abnormal) and her pulse is 59  (abnormal). Her respiration is 20 and oxygen saturation is 97%.   Physical Exam Vitals reviewed.  Constitutional:      Comments: Her breast exam shows right breast with no masses, edema or erythema.  There is no right axillary adenopathy.  Left breast shows the lumpectomy scar at about the 5 o'clock position.  This is healing.  She has little bit of ecchymosis about the areola.  There is little firmness at the lumpectomy site.  She has a healing left axillary lymphadenectomy scar.  HENT:     Head: Normocephalic and atraumatic.  Eyes:     Pupils: Pupils are equal, round, and reactive to light.  Cardiovascular:  Rate and Rhythm: Normal rate and regular rhythm.     Heart sounds: Normal heart sounds.  Pulmonary:     Effort: Pulmonary effort is normal.     Breath sounds: Normal breath sounds.  Abdominal:     General: Bowel sounds are normal.     Palpations: Abdomen is soft.  Musculoskeletal:        General: No tenderness or deformity. Normal range of motion.     Cervical back: Normal range of motion.  Lymphadenopathy:     Cervical: No cervical adenopathy.  Skin:    General: Skin is warm and dry.     Findings: No erythema or rash.  Neurological:     Mental Status: She is alert and oriented to person, place, and time.  Psychiatric:        Behavior: Behavior normal.        Thought Content: Thought content normal.        Judgment: Judgment normal.     Lab Results  Component Value Date   WBC 8.3 02/07/2021   HGB 13.3 02/07/2021   HCT 39.6 02/07/2021   MCV 92.5 02/07/2021   PLT 155 02/07/2021     Chemistry      Component Value Date/Time   NA 142 02/07/2021 0945   NA 147 (H) 01/20/2021 1446   NA 147 (H) 01/22/2017 1451   NA 140 12/31/2015 1052   K 3.9 02/07/2021 0945   K 4.9 (H) 01/22/2017 1451   K 4.2 12/31/2015 1052   CL 105 02/07/2021 0945   CL 106 01/22/2017 1451   CO2 29 02/07/2021 0945   CO2 29 01/22/2017 1451   CO2 23 12/31/2015 1052   BUN 28 (H) 02/07/2021  0945   BUN 18 01/20/2021 1446   BUN 23 (H) 01/22/2017 1451   BUN 21.4 12/31/2015 1052   CREATININE 1.22 (H) 02/07/2021 0945   CREATININE 1.6 (H) 01/22/2017 1451   CREATININE 1.4 (H) 12/31/2015 1052      Component Value Date/Time   CALCIUM 9.9 02/07/2021 0945   CALCIUM 10.3 01/22/2017 1451   CALCIUM 9.7 12/31/2015 1052   ALKPHOS 70 02/07/2021 0945   ALKPHOS 65 01/22/2017 1451   ALKPHOS 87 12/31/2015 1052   AST 16 02/07/2021 0945   AST 31 12/31/2015 1052   ALT 13 02/07/2021 0945   ALT 25 01/22/2017 1451   ALT 36 12/31/2015 1052   BILITOT 0.4 02/07/2021 0945   BILITOT 0.42 12/31/2015 1052      Impression and Plan: Ms. Garvey is 80 year old white female with a new diagnosis of stage  IIA infiltrating ductal carcinoma of the left breast.  Again, the tumor is triple negative.  We will see about getting her set up with adjuvant chemotherapy.  I think this would be reasonable.  I would probably consider her for Adriamycin/Cytoxan.  I think she would be able to tolerate this.  I would give her 6 cycles.  We will get her set up with an echocardiogram.  I gave her information sheets about the chemotherapy protocol.  I explained to her about the hair loss.  I explained to her about the white cell count going down.  She will likely need Neulasta after each cycle, given her age.  I really think that she should tolerate treatment well.  I think with the use of adjuvant chemotherapy, the risk of recurrence should be cut down to less than 10%.  As far as radiation goes, we will consider this after chemotherapy.  She needs to have some dental work done before we start treatment.  She is post to have this done after Thanksgiving.  Because of the Port-A-Cath, we did send in some amoxicillin for her to take prior to her procedure (2 g p.o. 30 minutes before her procedure).  We will plan to see her back ourselves when she has her second cycle of treatment.  We will have to monitor the  hemochromatosis throughout her breast cancer therapy.   Marland Kitchen Volanda Napoleon, MD 11/18/20224:34 PM

## 2021-02-07 NOTE — Telephone Encounter (Signed)
Received a call from [facility] stating that patient is scheduled to have a coronalectomy and asked about possible antibiotics.  Dr Marin Olp notified and Antibiotics Rx sent to pharmacy.  [facility] contacted to notify of above

## 2021-02-07 NOTE — Progress Notes (Signed)
START OFF PATHWAY REGIMEN - Breast   [id number]:AC (Doxorubicin IV + Cyclophosphamide IV) q21 Days:   A cycle is every 21 days:     Doxorubicin      Cyclophosphamide   **Always confirm dose/schedule in your pharmacy ordering system**  Patient Characteristics: Postoperative without Neoadjuvant Therapy (Pathologic Staging), Invasive Disease, Adjuvant Therapy, HER2 Negative/Unknown/Equivocal, ER Negative/Unknown, Node Negative, pT1a-c, N17m or pT1c or Higher, pN0 Therapeutic Status: Postoperative without Neoadjuvant Therapy (Pathologic Staging) AJCC Grade: G3 AJCC N Category: pN0 AJCC M Category: cM0 ER Status: Negative (-) AJCC 8 Stage Grouping: IIA HER2 Status: Negative (-) Oncotype Dx Recurrence Score: Not Appropriate AJCC T Category: pT2 PR Status: Negative (-) Adjuvant Therapy Status: No Adjuvant Therapy Received Yet or Changing Initial Adjuvant Regimen due to Tolerance Intent of Therapy: Curative Intent, Discussed with Patient

## 2021-02-10 ENCOUNTER — Encounter: Payer: Self-pay | Admitting: Family

## 2021-02-10 ENCOUNTER — Encounter: Payer: Self-pay | Admitting: *Deleted

## 2021-02-10 ENCOUNTER — Encounter: Payer: Self-pay | Admitting: Hematology & Oncology

## 2021-02-10 ENCOUNTER — Other Ambulatory Visit: Payer: Self-pay | Admitting: Pharmacist

## 2021-02-10 NOTE — Progress Notes (Signed)
Plan to begin patient on [facility] on 02/28/2021. Patient needs ECHO and chemo education prior to start.  ECHO scheduled for 02/11/2021 at [facility].  Chemo education scheduled for 02/18/2021.  Called patient and reviewed both appointments with her including time, date and location. Answered her questions to her satisfaction.   Patient also asked questions about wigs and bra inserts. Referred her to [facility]. Explained that the office would provide them with whatever they needed to file insurance.   Oncology Nurse Navigator Documentation  Oncology Nurse Navigator Flowsheets 02/10/2021  Abnormal Finding Date -  Confirmed Diagnosis Date -  Diagnosis Status -  Planned Course of Treatment -  Phase of Treatment -  Expected Surgery Date -  Surgery Actual Start Date: -  Navigator Follow Up Date: 02/18/2021  Navigator Follow Up Reason: Chemo Class  Navigator Location [facility]  Referral Date to RadOnc/MedOnc -  Navigator Encounter Type Telephone  Telephone Appt Confirmation/Clarification;Education;Outgoing Call  Treatment Initiated Date -  Patient Visit Type MedOnc  Treatment Phase Active Tx  Barriers/Navigation Needs Coordination of Care;Education  Education Other  Interventions Coordination of Care;Education;Psycho-Social Support  Acuity Level 2-Minimal Needs (1-2 Barriers Identified)  Referrals -  Coordination of Care Appts;Radiology  Education Method Verbal  Support Groups/Services Friends and Family  Specialty Items/DME Wigs  Time Spent with Patient 70

## 2021-02-10 NOTE — Progress Notes (Signed)
Patient is doing well today and is eager to speak to Dr Marin Olp about her planned treatment. After speaking with her family, she has decided to proceed with genetic testing. Blood sample drawn and mailed. Roma Kayser notified and referral order placed.   Will follow up with patient on Monday once Dr Marin Olp places treatment orders to get all necessary appointments scheduled.   Oncology Nurse Navigator Documentation  Oncology Nurse Navigator Flowsheets 02/07/2021  Abnormal Finding Date -  Confirmed Diagnosis Date -  Diagnosis Status -  Planned Course of Treatment -  Phase of Treatment -  Expected Surgery Date -  Surgery Actual Start Date: -  Navigator Follow Up Date: 02/10/2021  Navigator Follow Up Reason: Appointment Review  Navigator Location [facility]  Referral Date to RadOnc/MedOnc -  Navigator Encounter Type Follow-up Appt  Telephone -  Treatment Initiated Date -  Patient Visit Type MedOnc  Treatment Phase Active Tx  Barriers/Navigation Needs Coordination of Care;Education  Education -  Interventions Referrals  Acuity Level 2-Minimal Needs (1-2 Barriers Identified)  Referrals Genetics  Coordination of Care -  Education Method -  Support Groups/Services Friends and Family  Time Spent with Patient 25

## 2021-02-11 ENCOUNTER — Encounter: Payer: Self-pay | Admitting: *Deleted

## 2021-02-11 ENCOUNTER — Ambulatory Visit (HOSPITAL_COMMUNITY): Admission: RE | Admit: 2021-02-11 | Payer: Medicare Other | Source: Ambulatory Visit

## 2021-02-11 NOTE — Progress Notes (Signed)
Patient called upset that her echo appointment today was cancelled. Apparently her appointment was accidentally double booked and she had to be rescheduled to 02/26/2021. This is still prior to her chemo start on 02/28/2021. Assured her that as long as the ECHO is complete prior to treatment the later date is ok.  Oncology Nurse Navigator Documentation  Oncology Nurse Navigator Flowsheets 02/11/2021  Abnormal Finding Date -  Confirmed Diagnosis Date -  Diagnosis Status -  Planned Course of Treatment -  Phase of Treatment -  Expected Surgery Date -  Surgery Actual Start Date: -  Navigator Follow Up Date: 02/18/2021  Navigator Follow Up Reason: Chemo Class  Navigator Location [facility]  Referral Date to RadOnc/MedOnc -  Navigator Encounter Type Telephone  Telephone Incoming Call  Treatment Initiated Date -  Patient Visit Type MedOnc  Treatment Phase Active Tx  Barriers/Navigation Needs Coordination of Care;Education  Education -  Interventions Psycho-Social Support  Acuity Level 2-Minimal Needs (1-2 Barriers Identified)  Referrals -  Coordination of Care -  Education Method -  Support Groups/Services Friends and Family  Specialty Items/DME -  Time Spent with Patient 15

## 2021-02-12 ENCOUNTER — Other Ambulatory Visit: Payer: Self-pay | Admitting: *Deleted

## 2021-02-12 DIAGNOSIS — C50912 Malignant neoplasm of unspecified site of left female breast: Secondary | ICD-10-CM

## 2021-02-12 MED ORDER — DEXAMETHASONE 4 MG PO TABS: 8.0000 mg | ORAL_TABLET | Freq: Every day | ORAL | 1 refills | Status: DC

## 2021-02-12 MED ORDER — ONDANSETRON HCL 8 MG PO TABS: 8.0000 mg | ORAL_TABLET | Freq: Two times a day (BID) | ORAL | 1 refills | Status: DC | PRN

## 2021-02-12 MED ORDER — PROCHLORPERAZINE MALEATE 10 MG PO TABS: 10.0000 mg | ORAL_TABLET | Freq: Four times a day (QID) | ORAL | 1 refills | Status: DC | PRN

## 2021-02-17 ENCOUNTER — Other Ambulatory Visit: Payer: Self-pay

## 2021-02-17 ENCOUNTER — Ambulatory Visit (HOSPITAL_COMMUNITY)
Admission: RE | Admit: 2021-02-17 | Discharge: 2021-02-17 | Disposition: A | Discharge status: Home / Self Care | Payer: Medicare Other | Source: Ambulatory Visit | Attending: Hematology & Oncology | Admitting: Hematology & Oncology

## 2021-02-17 DIAGNOSIS — E785 Hyperlipidemia, unspecified: Secondary | ICD-10-CM | POA: Diagnosis not present

## 2021-02-17 DIAGNOSIS — I700 Atherosclerosis of aorta: Secondary | ICD-10-CM | POA: Insufficient documentation

## 2021-02-17 DIAGNOSIS — I3481 Nonrheumatic mitral (valve) annulus calcification: Secondary | ICD-10-CM | POA: Insufficient documentation

## 2021-02-17 DIAGNOSIS — I358 Other nonrheumatic aortic valve disorders: Secondary | ICD-10-CM | POA: Diagnosis not present

## 2021-02-17 DIAGNOSIS — I10 Essential (primary) hypertension: Secondary | ICD-10-CM | POA: Diagnosis not present

## 2021-02-17 DIAGNOSIS — Z951 Presence of aortocoronary bypass graft: Secondary | ICD-10-CM | POA: Diagnosis not present

## 2021-02-17 DIAGNOSIS — Z01818 Encounter for other preprocedural examination: Secondary | ICD-10-CM | POA: Diagnosis not present

## 2021-02-17 DIAGNOSIS — E119 Type 2 diabetes mellitus without complications: Secondary | ICD-10-CM | POA: Diagnosis not present

## 2021-02-17 DIAGNOSIS — Z0189 Encounter for other specified special examinations: Secondary | ICD-10-CM

## 2021-02-17 LAB — ECHOCARDIOGRAM COMPLETE
Area-P 1/2: 2.36 cm2
S' Lateral: 2.3 cm

## 2021-02-17 MED ORDER — PERFLUTREN LIPID MICROSPHERE
1.0000 mL | INTRAVENOUS | Status: AC | PRN
Start: 2021-02-17 — End: 2021-02-17
  Administered 2021-02-17: 16:00:00 2 mL via INTRAVENOUS
  Filled 2021-02-17: qty 10

## 2021-02-18 ENCOUNTER — Inpatient Hospital Stay: Payer: Medicare Other

## 2021-02-18 ENCOUNTER — Encounter: Payer: Self-pay | Admitting: *Deleted

## 2021-02-18 NOTE — Progress Notes (Signed)
Patient in chemotherapy education class with daughter [name].  Discussed side effects of      Adriamycin and Cytoxan which include but are not limited to myelosuppression, decreased appetite, fatigue, fever, allergic or infusional reaction, mucositis, cardiac toxicity, cough, SOB, altered taste, nausea and vomiting, diarrhea, constipation, elevated LFTs myalgia and arthralgias, hair loss or thinning, rash, skin dryness, nail changes, peripheral neuropathy, discolored urine, delayed wound healing, mental changes (Chemo brain), increased risk of infections, weight loss.  Reviewed infusion room and office policy and procedure and phone numbers 24 hours x 7 days a week.  Reviewed when to call the office with any concerns or problems.  Scientist, clinical (histocompatibility and immunogenetics) given.  Discussed portacath insertion and EMLA cream administration.  Antiemetic protocol and chemotherapy schedule reviewed. Patient verbalized understanding of chemotherapy indications and possible side effects.  Teachback done

## 2021-02-18 NOTE — Progress Notes (Signed)
Cycle one scheduled to start 02/28/2021.  Oncology Nurse Navigator Documentation  Oncology Nurse Navigator Flowsheets 02/18/2021  Abnormal Finding Date -  Confirmed Diagnosis Date -  Diagnosis Status -  Planned Course of Treatment Chemotherapy  Phase of Treatment Chemo  Expected Surgery Date -  Surgery Actual Start Date: -  Navigator Follow Up Date: 02/28/2021  Navigator Follow Up Reason: Chemotherapy  Navigator Location [facility]  Referral Date to RadOnc/MedOnc -  Navigator Encounter Type Education  Telephone -  Treatment Initiated Date -  Patient Visit Type MedOnc  Treatment Phase Active Tx  Barriers/Navigation Needs Coordination of Care;Education  Education -  Interventions None Required  Acuity Level 2-Minimal Needs (1-2 Barriers Identified)  Referrals -  Coordination of Care -  Education Method -  Support Groups/Services Friends and Family  Specialty Items/DME -  Time Spent with Patient 15

## 2021-02-20 ENCOUNTER — Other Ambulatory Visit: Payer: Self-pay

## 2021-02-20 ENCOUNTER — Encounter: Payer: Self-pay | Admitting: Physical Therapy

## 2021-02-20 ENCOUNTER — Ambulatory Visit: Payer: Medicare Other | Attending: General Surgery | Admitting: Physical Therapy

## 2021-02-20 DIAGNOSIS — R600 Localized edema: Secondary | ICD-10-CM | POA: Insufficient documentation

## 2021-02-20 DIAGNOSIS — C50912 Malignant neoplasm of unspecified site of left female breast: Secondary | ICD-10-CM | POA: Diagnosis not present

## 2021-02-20 DIAGNOSIS — Z171 Estrogen receptor negative status [ER-]: Secondary | ICD-10-CM | POA: Insufficient documentation

## 2021-02-20 DIAGNOSIS — R293 Abnormal posture: Secondary | ICD-10-CM | POA: Diagnosis not present

## 2021-02-20 DIAGNOSIS — M25612 Stiffness of left shoulder, not elsewhere classified: Secondary | ICD-10-CM | POA: Diagnosis not present

## 2021-02-20 NOTE — Therapy (Signed)
[facility] @ [place], [place], [address] Phone: [phone]   Fax:  [phone]  Physical Therapy Treatment  Patient Details  Name: Carmen [name] MRN: [id number] Date of Birth: May 16, 1940 Referring Provider (PT): Dr. Marlou Starks   Encounter Date: 02/20/2021   PT End of Session - 02/20/21 1112     Visit Number 3    Number of Visits 10    Date for PT Re-Evaluation 03/04/21    PT Start Time 64    PT Stop Time 9242    PT Time Calculation (min) 45 min             Past Medical History:  Diagnosis Date   Arthritis    Asymptomatic PVCs    Bilateral shoulder pain    CAD (coronary artery disease)    Cancer ([place]) 11/2015   [id number]  right upper arm   Chronic renal insufficiency, stage 3 (moderate) ([facility] [place])    Diabetes mellitus without complication ([place])    Diverticulosis    Endometrial polyp    External hemorrhoids    Goals of care, counseling/discussion 02/07/2021   Gout    Hemochromatosis, hereditary ([place]) 11/24/2012   History of anemia    History of duodenal ulcer 1990   Hyperlipidemia    Hypertension    Internal hemorrhoids    Jaundice    age 80 or 80   Myocardial infarction ([place]) 2007   Neuromuscular disorder ([place])    peripheral neuropathy   PMB (postmenopausal bleeding)    PONV (postoperative nausea and vomiting)    Prolapse of female pelvic organs    uses pessary   Seizures ([place])    had one at Dr. Antonieta Pert office after getting blood drawn   Stage I breast cancer, left ([place]) 12/16/2020   Tick bite 08/12/2017   had 3 bites   Type 2 diabetes mellitus with hyperglycemia, without long-term current use of insulin ([place]) 01/27/2019   Vitamin D deficiency     Past Surgical History:  Procedure Laterality Date   BREAST LUMPECTOMY WITH RADIOACTIVE SEED AND SENTINEL LYMPH NODE BIOPSY Left 01/14/2021   Procedure: LEFT BREAST LUMPECTOMY WITH RADIOACTIVE SEED AND SENTINEL LYMPH NODE BIOPSY;  Surgeon:  Jovita Kussmaul, MD;  Location: [facility];  Service: General;  Laterality: Left;   BREAST SURGERY     left breast lump--benign   CARDIAC CATHETERIZATION  06/03/2005   COLONOSCOPY  11/28/2001   CORONARY ARTERY BYPASS GRAFT  2007   x2 Dr. Roxan Hockey, [place] to [facility], [facility] to [place] N/A 09/28/2017   Procedure: [place];  Surgeon: Molli Posey, MD;  Location: [facility];  Service: Gynecology;  Laterality: N/A;   LIPOMA EXCISION     back   LIPOMA EXCISION Left 01/14/2021   Procedure: EXCISION LEFT CHEST WALL LIPOMA;  Surgeon: Jovita Kussmaul, MD;  Location: [facility];  Service: General;  Laterality: Left;   PORTACATH PLACEMENT Right 01/14/2021   Procedure: INSERTION PORT-A-CATH;  Surgeon: Jovita Kussmaul, MD;  Location: [facility];  Service: General;  Laterality: Right;   UPPER GI ENDOSCOPY  01/20/1989    There were no vitals filed for this visit.   Subjective Assessment - 02/20/21 1107     Subjective Pt needs to have extensive dental work next work and is starting chemotherpy next Friday.  She feels like her shoulders are doing very well, she  is doing her exerrcises and the swelling is better under under arm.  She says she is not sore under arm any more and the swelling there is better    Pertinent History Pt was diagnosed with Left Invasive Breast Cancer that is Triple Negative with a Ki67 of 40%.  She is s/p left lumpectomy with SLNB on 01/14/2021.Pt has hemochromatosis, DM,Melanoma arm, leg x 4, MI,open heart surgery,gout,Renal insufficiency    Patient Stated Goals Reassessment after surgery    Currently in Pain? No/denies                [facility] PT Assessment - 02/20/21 0001       Observation/Other Assessments   Other Surveys  Quick Dash    Quick DASH  25   Pt states limitation is from arthritis in her hands, not from recent surgery      AROM   Left Shoulder Flexion 185 Degrees    Left Shoulder ABduction 175 Degrees    Left Shoulder Internal Rotation 65 Degrees    Left Shoulder External Rotation 95 Degrees                   Quick Dash - 02/20/21 0001     Open a tight or new jar Unable    Do heavy household chores (wash walls, wash floors) Mild difficulty    Carry a shopping bag or briefcase Mild difficulty    Wash your back Mild difficulty    Use a knife to cut food Mild difficulty    Recreational activities in which you take some force or impact through your arm, shoulder, or hand (golf, hammering, tennis) Mild difficulty    During the past week, to what extent has your arm, shoulder or hand problem interfered with your normal social activities with family, friends, neighbors, or groups? Not at all    During the past week, to what extent has your arm, shoulder or hand problem limited your work or other regular daily activities Not at all    Arm, shoulder, or hand pain. Mild    Tingling (pins and needles) in your arm, shoulder, or hand None    Difficulty Sleeping Mild difficulty    DASH Score 25 %                    [facility] Adult PT Treatment/Exercise - 02/20/21 0001       Manual Therapy   Manual Therapy Manual Lymphatic Drainage (MLD)    Manual therapy comments Palpable softening in axilla at end of session    Manual Lymphatic Drainage (MLD) instructed in and perform stationary circles MLD to left axilla with pt in supine with arm overhead supported on pillow. Hand over hand instruction and use of mirror for visual cures.  Pt able to return demonstration and reports she will be able to do it at home                          PT Long Term Goals - 02/20/21 1110       PT LONG TERM GOAL #1   Title Pt will achieve left shoulder AROM within 5-7 degrees of baselines ROM for ability to perform home activities    Status Achieved      PT LONG TERM GOAL #2   Title Pt will have decreased  left axillary swelling by greater than 50%    Baseline Pt feels it is improved  Status Achieved      PT LONG TERM GOAL #3   Title Pt will be independent in self MLD for left axillary/breast swelling    Status Achieved      PT LONG TERM GOAL #4   Title Pts quick dash will return to pre-surgical baseline of 11%    Baseline Pt still has increased Quick DASH but feels it is due to her hand arthritis and not from recent surgery    Status Not Met                   Plan - 02/20/21 1113     Clinical Impression Statement Pt is continuing to do her exercises at home and feels that she is recovering nicely from surgery.  She has regained her shoulder ROM and feels that the swelling under her arm is better. She was given information about where to get bra from [facility] with a partial prosthetic as she  continues to wear handkerchiefs in her bra . Pt was instructed in self MLD to left axilla today She was able to perform it and reports she will follow through at home. Feel pt can be discharged from PT as she is doing well with her shoulder and can manage her symptoms at home. She will be focused on her dental work and chemotherapy.  She will come back in January for [facility] appt and can ask more questions then. Will discharge today    Personal Factors and Comorbidities Comorbidity 3+    Comorbidities Invasive Breast CA pending Chemo and radiation,hemachromatosis, DM, renal insufficiency,CABG    Stability/Clinical Decision Making Stable/Uncomplicated    Rehab Potential Excellent    PT Frequency 2x / week    PT Duration 4 weeks    PT Treatment/Interventions ADLs/Self Care Home Management;Therapeutic exercise;Patient/family education;Manual techniques;Passive range of motion;Scar mobilization;Manual lymph drainage    PT Next Visit Plan Discharge    PT Home Exercise Plan continue 4 post op exercises and self MLD    Consulted and Agree with Plan of Care Patient             Patient  will benefit from skilled therapeutic intervention in order to improve the following deficits and impairments:  Decreased knowledge of precautions, Postural dysfunction, Increased edema, Decreased range of motion  Visit Diagnosis: Malignant neoplasm of left breast in female, estrogen receptor negative, unspecified site of breast (HCC)  Abnormal posture  Localized edema  Stiffness of left shoulder, not elsewhere classified     Problem List Patient Active Problem List   Diagnosis Date Noted   Goals of care, counseling/discussion 02/07/2021   Type 2 diabetes mellitus with hyperglycemia, without long-term current use of insulin ([place]) 01/20/2021   Current moderate episode of major depressive disorder without prior episode ([place]) 12/26/2020   Coronary artery disease of bypass graft of native heart with stable angina pectoris ([place]) 12/26/2020   Stage I breast cancer, left ([place]) 12/16/2020   Type 2 diabetes mellitus with diabetic polyneuropathy, without long-term current use of insulin ([place]) 12/29/2019   Type 2 diabetes mellitus with stage 3b chronic kidney disease, without long-term current use of insulin ([place]) 01/27/2019   Chronic renal insufficiency, stage 3 (moderate) ([place]) 09/10/2017   Chronic pain of both shoulders 04/27/2017   Vitamin D deficiency 01/28/2016   Non-insulin treated type 2 diabetes mellitus ([place]) 11/12/2015   Statin intolerance 08/18/2013   Neuromuscular disorder ([place])    Hemochromatosis, hereditary ([place]) 11/24/2012   S/P CABG x 2  08/09/2012   PVC's (premature ventricular contractions) 08/09/2012   Gout 06/20/2012   Hypertension associated with diabetes ([place]) 06/20/2012   HLD (hyperlipidemia) 06/20/2012   Arthritis 06/20/2012   PHYSICAL THERAPY DISCHARGE SUMMARY  Visits from Start of Care: 3  Current functional level related to goals / functional outcomes: Pt is independent in self care    Remaining deficits: Minor swelling in left axilla    Education /  Equipment: Home exercise, self MLD   Patient agrees to discharge. Patient goals were partially met. Patient is being discharged due to being pleased with the current functional level.  Norwood Levo, PT 02/20/2021, 11:52 AM  [facility] @ [place], [place], [address] Phone: [phone]   Fax:  [phone]  Name: Carmen [name] MRN: [id number] Date of Birth: 05-09-1940

## 2021-02-20 NOTE — Patient Instructions (Signed)
First of all, check with your insurance company to see if provider is in network  ° ° °[facility] (for wigs and compression sleeves / gloves/gauntlets )  ° °[address] °[place], [place]    [address] °Phone: [phone]   Fax: [phone] °Will file some insurances --- call for appointment  ° °[facility] (for mastectomy prosthetics and garments) °[address] °[place], [place]  °Phone: [phone]  Fax: [phone] °Will file some insurances --- call for appointment ° °[facility]  °[address]  °[place], [place] [address] °[phone] °Lower extremity garments ° °[facility] °[address] °[place], [place]  [address] °[phone] ° ° °[facility] (for compression garments, especilly for lower extremities) °[address] °[place], [place]  [address] °[phone] °Call for appointment  ° ° °Melissa Meares ,certified fitter °[facility]  °[phone] ° °[facility] (for mastectomy supplies and garments) °[address] °[place], [place] [address] °[phone] ° °Other Resources: °National Lymphedema Network:  www.lymphnet.org °www.Klosetraining.com for patient articles and self manual lymph drainage information °www.lymphedemablog.com has informative articles.  °www.compressionguru.com °www.lymphedemaproducts.com °www.brightlifedirect.com  °

## 2021-02-21 ENCOUNTER — Other Ambulatory Visit: Payer: Self-pay | Admitting: Internal Medicine

## 2021-02-21 ENCOUNTER — Telehealth: Payer: Self-pay

## 2021-02-21 DIAGNOSIS — C50912 Malignant neoplasm of unspecified site of left female breast: Secondary | ICD-10-CM | POA: Diagnosis not present

## 2021-02-21 MED ORDER — INSULIN PEN NEEDLE 32G X 4 MM MISC: 1.0000 | Freq: Three times a day (TID) | 1 refills | Status: DC

## 2021-02-21 MED ORDER — NOVOLOG FLEXPEN 100 UNIT/ML ~~LOC~~ SOPN: PEN_INJECTOR | SUBCUTANEOUS | 11 refills | Status: DC

## 2021-02-21 NOTE — Telephone Encounter (Signed)
Patient states that she will start chemo next week. Patient states that she will be starting steroids and knows that her sugars will high. Patient states that you discuss starting another medication. She would like the pen if possible. Patient doesn't like long needles.

## 2021-02-21 NOTE — Telephone Encounter (Signed)
Patient has been giving information and verbalized understanding. I have also sent the sliding scale information in my chart.

## 2021-02-21 NOTE — Progress Notes (Signed)
Pharmacist Chemotherapy Monitoring - Initial Assessment    Anticipated start date: 02/28/21   The following has been reviewed per standard work regarding the patient's treatment regimen: The patient's diagnosis, treatment plan and drug doses, and organ/hematologic function Lab orders and baseline tests specific to treatment regimen  The treatment plan start date, drug sequencing, and pre-medications Prior authorization status  Patient's documented medication list, including drug-drug interaction screen and prescriptions for anti-emetics and supportive care specific to the treatment regimen The drug concentrations, fluid compatibility, administration routes, and timing of the medications to be used The patient's access for treatment and lifetime cumulative dose history, if applicable  The patient's medication allergies and previous infusion related reactions, if applicable   Changes made to treatment plan:  N/A  Follow up needed:  N/A   Claybon Jabs, [facility], 02/21/2021  3:16 PM

## 2021-02-21 NOTE — Progress Notes (Signed)
Pt will be started on prandial insulin per correction scale as she will be starting checmo with steroids     Novolog correctional insulin: Use the scale below to help guide you at breakfast , lunch and dinner   Blood sugar before meal Number of units to inject  Less than 180 0 unit  181 -  230 1 units  231 -  280 2 units  281 -  330 3 units  331 -  380 4 units  381 -  430 5 units  431 -  480 6 units  481 -  530 7 units

## 2021-02-26 ENCOUNTER — Other Ambulatory Visit (HOSPITAL_COMMUNITY): Payer: Medicare Other

## 2021-02-27 ENCOUNTER — Other Ambulatory Visit: Payer: Self-pay | Admitting: *Deleted

## 2021-02-27 ENCOUNTER — Encounter: Payer: Medicare Other | Admitting: Rehabilitation

## 2021-02-27 ENCOUNTER — Telehealth: Payer: Self-pay

## 2021-02-27 ENCOUNTER — Other Ambulatory Visit: Payer: Self-pay

## 2021-02-27 ENCOUNTER — Other Ambulatory Visit: Payer: Self-pay | Admitting: Internal Medicine

## 2021-02-27 DIAGNOSIS — I152 Hypertension secondary to endocrine disorders: Secondary | ICD-10-CM

## 2021-02-27 DIAGNOSIS — E1159 Type 2 diabetes mellitus with other circulatory complications: Secondary | ICD-10-CM

## 2021-02-27 DIAGNOSIS — Z951 Presence of aortocoronary bypass graft: Secondary | ICD-10-CM

## 2021-02-27 MED ORDER — INSULIN LISPRO (1 UNIT DIAL) 100 UNIT/ML (KWIKPEN): PEN_INJECTOR | SUBCUTANEOUS | 11 refills | Status: DC

## 2021-02-27 MED ORDER — FUROSEMIDE 20 MG PO TABS: 20.0000 mg | ORAL_TABLET | Freq: Every day | ORAL | 0 refills | Status: DC

## 2021-02-27 MED ORDER — DICLOFENAC SODIUM 1 % EX GEL: CUTANEOUS | 0 refills | Status: DC

## 2021-02-27 NOTE — Telephone Encounter (Signed)
Pharmacy states that insurance will cover the Humalog. Can you send a new script over

## 2021-02-28 ENCOUNTER — Encounter: Payer: Self-pay | Admitting: *Deleted

## 2021-02-28 ENCOUNTER — Inpatient Hospital Stay: Payer: Medicare Other | Attending: Hematology & Oncology

## 2021-02-28 ENCOUNTER — Inpatient Hospital Stay: Payer: Medicare Other

## 2021-02-28 ENCOUNTER — Other Ambulatory Visit: Payer: Self-pay

## 2021-02-28 VITALS — BP 160/65 | HR 72 | Temp 98.0°F | Resp 18

## 2021-02-28 DIAGNOSIS — C50912 Malignant neoplasm of unspecified site of left female breast: Secondary | ICD-10-CM | POA: Diagnosis not present

## 2021-02-28 DIAGNOSIS — Z5189 Encounter for other specified aftercare: Secondary | ICD-10-CM | POA: Insufficient documentation

## 2021-02-28 DIAGNOSIS — Z171 Estrogen receptor negative status [ER-]: Secondary | ICD-10-CM | POA: Insufficient documentation

## 2021-02-28 LAB — CBC WITH DIFFERENTIAL (CANCER CENTER ONLY)
Abs Immature Granulocytes: 0.03 10*3/uL (ref 0.00–0.07)
Basophils Absolute: 0 10*3/uL (ref 0.0–0.1)
Basophils Relative: 1 %
Eosinophils Absolute: 0.3 10*3/uL (ref 0.0–0.5)
Eosinophils Relative: 4 %
HCT: 39.2 % (ref 36.0–46.0)
Hemoglobin: 13.3 g/dL (ref 12.0–15.0)
Immature Granulocytes: 0 %
Lymphocytes Relative: 34 %
Lymphs Abs: 2.8 10*3/uL (ref 0.7–4.0)
MCH: 30.6 pg (ref 26.0–34.0)
MCHC: 33.9 g/dL (ref 30.0–36.0)
MCV: 90.1 fL (ref 80.0–100.0)
Monocytes Absolute: 0.4 10*3/uL (ref 0.1–1.0)
Monocytes Relative: 5 %
Neutro Abs: 4.6 10*3/uL (ref 1.7–7.7)
Neutrophils Relative %: 56 %
Platelet Count: 155 10*3/uL (ref 150–400)
RBC: 4.35 MIL/uL (ref 3.87–5.11)
RDW: 13.2 % (ref 11.5–15.5)
WBC Count: 8.3 10*3/uL (ref 4.0–10.5)
nRBC: 0 % (ref 0.0–0.2)

## 2021-02-28 LAB — CMP (CANCER CENTER ONLY)
ALT: 16 U/L (ref 0–44)
AST: 20 U/L (ref 15–41)
Albumin: 4.2 g/dL (ref 3.5–5.0)
Alkaline Phosphatase: 77 U/L (ref 38–126)
Anion gap: 10 (ref 5–15)
BUN: 18 mg/dL (ref 8–23)
CO2: 28 mmol/L (ref 22–32)
Calcium: 10.3 mg/dL (ref 8.9–10.3)
Chloride: 102 mmol/L (ref 98–111)
Creatinine: 1.21 mg/dL — ABNORMAL HIGH (ref 0.44–1.00)
GFR, Estimated: 45 mL/min — ABNORMAL LOW (ref >60)
Glucose, Bld: 127 mg/dL — ABNORMAL HIGH (ref 70–99)
Potassium: 3.6 mmol/L (ref 3.5–5.1)
Sodium: 140 mmol/L (ref 135–145)
Total Bilirubin: 0.4 mg/dL (ref 0.3–1.2)
Total Protein: 6.7 g/dL (ref 6.5–8.1)

## 2021-02-28 MED ORDER — HEPARIN SOD (PORK) LOCK FLUSH 100 UNIT/ML IV SOLN
500.0000 [IU] | Freq: Once | INTRAVENOUS | Status: AC | PRN
  Administered 2021-02-28: 500 [IU]

## 2021-02-28 MED ORDER — SODIUM CHLORIDE 0.9 % IV SOLN
10.0000 mg | Freq: Once | INTRAVENOUS | Status: AC
  Administered 2021-02-28: 10 mg via INTRAVENOUS
  Filled 2021-02-28: qty 10

## 2021-02-28 MED ORDER — SODIUM CHLORIDE 0.9 % IV SOLN
150.0000 mg | Freq: Once | INTRAVENOUS | Status: AC
  Administered 2021-02-28: 150 mg via INTRAVENOUS
  Filled 2021-02-28: qty 150

## 2021-02-28 MED ORDER — DOXORUBICIN HCL CHEMO IV INJECTION 2 MG/ML
54.0000 mg/m2 | Freq: Once | INTRAVENOUS | Status: AC
  Administered 2021-02-28: 102 mg via INTRAVENOUS
  Filled 2021-02-28: qty 51

## 2021-02-28 MED ORDER — PALONOSETRON HCL INJECTION 0.25 MG/5ML
0.2500 mg | Freq: Once | INTRAVENOUS | Status: AC
  Administered 2021-02-28: 0.25 mg via INTRAVENOUS
  Filled 2021-02-28: qty 5

## 2021-02-28 MED ORDER — SODIUM CHLORIDE 0.9 % IV SOLN
600.0000 mg/m2 | Freq: Once | INTRAVENOUS | Status: AC
  Administered 2021-02-28: 1140 mg via INTRAVENOUS
  Filled 2021-02-28: qty 5

## 2021-02-28 MED ORDER — SODIUM CHLORIDE 0.9% FLUSH
10.0000 mL | INTRAVENOUS | Status: DC | PRN
  Administered 2021-02-28: 10 mL

## 2021-02-28 MED ORDER — SODIUM CHLORIDE 0.9 % IV SOLN: Freq: Once | INTRAVENOUS | Status: AC

## 2021-02-28 NOTE — Patient Instructions (Signed)

## 2021-02-28 NOTE — Patient Instructions (Signed)
[facility]  Discharge Instructions: Thank you for choosing [facility] to provide your oncology and hematology care.   If you have a lab appointment with the [facility], please go directly to the [facility] and check in at the registration area.  Wear comfortable clothing and clothing appropriate for easy access to any Portacath or PICC line.   We strive to give you quality time with your provider. You may need to reschedule your appointment if you arrive late (15 or more minutes).  Arriving late affects you and other patients whose appointments are after yours.  Also, if you miss three or more appointments without notifying the office, you may be dismissed from the clinic at the provider's discretion.      For prescription refill requests, have your pharmacy contact our office and allow 72 hours for refills to be completed.    Today you received the following chemotherapy and/or immunotherapy agents Adriamycin; Cytoxan      To help prevent nausea and vomiting after your treatment, we encourage you to take your nausea medication as directed.  BELOW ARE SYMPTOMS THAT SHOULD BE REPORTED IMMEDIATELY: *FEVER GREATER THAN 100.4 F (38 C) OR HIGHER *CHILLS OR SWEATING *NAUSEA AND VOMITING THAT IS NOT CONTROLLED WITH YOUR NAUSEA MEDICATION *UNUSUAL SHORTNESS OF BREATH *UNUSUAL BRUISING OR BLEEDING *URINARY PROBLEMS (pain or burning when urinating, or frequent urination) *BOWEL PROBLEMS (unusual diarrhea, constipation, pain near the anus) TENDERNESS IN MOUTH AND THROAT WITH OR WITHOUT PRESENCE OF ULCERS (sore throat, sores in mouth, or a toothache) UNUSUAL RASH, SWELLING OR PAIN  UNUSUAL VAGINAL DISCHARGE OR ITCHING   Items with * indicate a potential emergency and should be followed up as soon as possible or go to the Emergency Department if any problems should occur.  Please show the CHEMOTHERAPY ALERT CARD or IMMUNOTHERAPY ALERT CARD at  check-in to the Emergency Department and triage nurse. Should you have questions after your visit or need to cancel or reschedule your appointment, please contact [facility]  [phone] and follow the prompts.  Office hours are 8:00 a.m. to 4:30 p.m. Monday - Friday. Please note that voicemails left after 4:00 p.m. may not be returned until the following business day.  We are closed weekends and major holidays. You have access to a nurse at all times for urgent questions. Please call the main number to the clinic [phone] and follow the prompts.  For any non-urgent questions, you may also contact your provider using MyChart. We now offer e-Visits for anyone 48 and older to request care online for non-urgent symptoms. For details visit [email or web address].   Also download the MyChart app! Go to the app store, search "MyChart", open the app, select [facility], and log in with your MyChart username and password.  Due to Covid, a mask is required upon entering the hospital/clinic. If you do not have a mask, one will be given to you upon arrival. For doctor visits, patients may have 1 support person aged 44 or older with them. For treatment visits, patients cannot have anyone with them due to current Covid guidelines and our immunocompromised population.

## 2021-02-28 NOTE — Progress Notes (Signed)
Patient here for first treatment. She is very nervous. Reviewed her treatment medications. Also reviewed all her take home medications and when she should take each of them.   She knows that the on-call service will cover our phones over the weekend, so if she runs into any trouble she knows to call the office.   Patient genetics have returned, but her referral was closed and not scheduled. Genetic referral placed.   Oncology Nurse Navigator Documentation  Oncology Nurse Navigator Flowsheets 02/28/2021  Abnormal Finding Date -  Confirmed Diagnosis Date -  Diagnosis Status -  Planned Course of Treatment -  Phase of Treatment Chemo  Chemotherapy Actual Start Date: 02/28/2021  Expected Surgery Date -  Surgery Actual Start Date: -  Navigator Follow Up Date: 03/19/2021  Navigator Follow Up Reason: Follow-up Appointment;Chemotherapy  Navigator Location [facility]  Referral Date to RadOnc/MedOnc -  Navigator Encounter Type Treatment;Education  Telephone -  Treatment Initiated Date -  Patient Visit Type MedOnc  Treatment Phase Active Tx  Barriers/Navigation Needs Coordination of International aid/development worker for Upcoming Surgery/ Treatment  Interventions Education;Psycho-Social Support;Referrals  Acuity Level 2-Minimal Needs (1-2 Barriers Identified)  Referrals Genetics  Coordination of Care -  Education Method Verbal;Teach-back  Support Groups/Services Friends and Family  Specialty Items/DME -  Time Spent with Patient 40

## 2021-03-03 ENCOUNTER — Other Ambulatory Visit: Payer: Self-pay

## 2021-03-03 ENCOUNTER — Inpatient Hospital Stay: Payer: Medicare Other

## 2021-03-03 VITALS — BP 154/54 | HR 62 | Temp 98.2°F | Resp 18

## 2021-03-03 DIAGNOSIS — Z171 Estrogen receptor negative status [ER-]: Secondary | ICD-10-CM | POA: Diagnosis not present

## 2021-03-03 DIAGNOSIS — C50912 Malignant neoplasm of unspecified site of left female breast: Secondary | ICD-10-CM

## 2021-03-03 DIAGNOSIS — Z5189 Encounter for other specified aftercare: Secondary | ICD-10-CM | POA: Diagnosis not present

## 2021-03-03 MED ORDER — PEGFILGRASTIM-BMEZ 6 MG/0.6ML ~~LOC~~ SOSY
6.0000 mg | PREFILLED_SYRINGE | Freq: Once | SUBCUTANEOUS | Status: AC
  Administered 2021-03-03: 6 mg via SUBCUTANEOUS
  Filled 2021-03-03: qty 0.6

## 2021-03-03 NOTE — Patient Instructions (Signed)

## 2021-03-04 ENCOUNTER — Telehealth: Payer: Self-pay | Admitting: Genetic Counselor

## 2021-03-04 NOTE — Telephone Encounter (Signed)
Scheduled appt per 12/9 referral. Pt is aware of appt date and time.

## 2021-03-10 ENCOUNTER — Other Ambulatory Visit: Payer: Self-pay | Admitting: *Deleted

## 2021-03-10 MED ORDER — ALLOPURINOL 100 MG PO TABS: ORAL_TABLET | ORAL | 0 refills | Status: DC

## 2021-03-12 ENCOUNTER — Encounter: Payer: Self-pay | Admitting: *Deleted

## 2021-03-12 NOTE — Progress Notes (Signed)
Patient calling with c/o swelling and firmness on her incision site in her axilla. She states that PT had taught her massage techniques for lymphedema which she has been doing. Today she noticed the area over her incision is swollen and firm. She states it's also slightly red, but denies pain or warmth. She called rehab, but they have discharged her from their service and would need a referral.   Offered to place a referral for PT to see her but she stated the person on the phone was coughing and said some staff were out sick. She doesn't want to go into that office. We see her next week for her next treatment. Recommended that she call the surgeon to speak with their office and see if they have concern about this development. Otherwise we can assess the site next week when she is here. She agreed. Gave her the phone number to [facility].   Oncology Nurse Navigator Documentation  Oncology Nurse Navigator Flowsheets 03/12/2021  Abnormal Finding Date -  Confirmed Diagnosis Date -  Diagnosis Status -  Planned Course of Treatment -  Phase of Treatment -  Chemotherapy Actual Start Date: -  Expected Surgery Date -  Surgery Actual Start Date: -  Navigator Follow Up Date: 03/19/2021  Navigator Follow Up Reason: Follow-up Appointment;Chemotherapy  Navigator Location [facility]  Referral Date to RadOnc/MedOnc -  Navigator Encounter Type Telephone  Telephone Symptom Mgt;Incoming Call  Treatment Initiated Date -  Patient Visit Type MedOnc  Treatment Phase Active Tx  Barriers/Navigation Needs Coordination of Care;Education  Education Pain/ Symptom Management;Other  Interventions Education;Psycho-Social Support  Acuity Level 2-Minimal Needs (1-2 Barriers Identified)  Referrals -  Coordination of Care -  Education Method Verbal  Support Groups/Services Friends and Family  Specialty Items/DME -  Time Spent with Patient 30

## 2021-03-13 DIAGNOSIS — C50912 Malignant neoplasm of unspecified site of left female breast: Secondary | ICD-10-CM | POA: Diagnosis not present

## 2021-03-19 ENCOUNTER — Encounter: Payer: Self-pay | Admitting: *Deleted

## 2021-03-19 ENCOUNTER — Inpatient Hospital Stay: Payer: Medicare Other

## 2021-03-19 ENCOUNTER — Encounter: Payer: Self-pay | Admitting: Hematology & Oncology

## 2021-03-19 ENCOUNTER — Inpatient Hospital Stay (HOSPITAL_BASED_OUTPATIENT_CLINIC_OR_DEPARTMENT_OTHER): Payer: Medicare Other | Admitting: Hematology & Oncology

## 2021-03-19 ENCOUNTER — Other Ambulatory Visit: Payer: Self-pay

## 2021-03-19 VITALS — BP 124/75 | HR 105 | Temp 99.0°F | Resp 19 | Ht 63.0 in | Wt 177.1 lb

## 2021-03-19 DIAGNOSIS — Z5189 Encounter for other specified aftercare: Secondary | ICD-10-CM | POA: Diagnosis not present

## 2021-03-19 DIAGNOSIS — C50912 Malignant neoplasm of unspecified site of left female breast: Secondary | ICD-10-CM | POA: Diagnosis not present

## 2021-03-19 DIAGNOSIS — Z95828 Presence of other vascular implants and grafts: Secondary | ICD-10-CM

## 2021-03-19 DIAGNOSIS — Z171 Estrogen receptor negative status [ER-]: Secondary | ICD-10-CM | POA: Diagnosis not present

## 2021-03-19 LAB — CBC WITH DIFFERENTIAL (CANCER CENTER ONLY)
Abs Immature Granulocytes: 1.2 10*3/uL — ABNORMAL HIGH (ref 0.00–0.07)
Basophils Absolute: 0.2 10*3/uL — ABNORMAL HIGH (ref 0.0–0.1)
Basophils Relative: 2 %
Eosinophils Absolute: 0 10*3/uL (ref 0.0–0.5)
Eosinophils Relative: 0 %
HCT: 39.6 % (ref 36.0–46.0)
Hemoglobin: 13.2 g/dL (ref 12.0–15.0)
Immature Granulocytes: 11 %
Lymphocytes Relative: 16 %
Lymphs Abs: 1.7 10*3/uL (ref 0.7–4.0)
MCH: 30.6 pg (ref 26.0–34.0)
MCHC: 33.3 g/dL (ref 30.0–36.0)
MCV: 91.9 fL (ref 80.0–100.0)
Monocytes Absolute: 0.7 10*3/uL (ref 0.1–1.0)
Monocytes Relative: 7 %
Neutro Abs: 7 10*3/uL (ref 1.7–7.7)
Neutrophils Relative %: 64 %
Platelet Count: 166 10*3/uL (ref 150–400)
RBC: 4.31 MIL/uL (ref 3.87–5.11)
RDW: 13.6 % (ref 11.5–15.5)
WBC Count: 10.8 10*3/uL — ABNORMAL HIGH (ref 4.0–10.5)
nRBC: 0 % (ref 0.0–0.2)

## 2021-03-19 LAB — CMP (CANCER CENTER ONLY)
ALT: 17 U/L (ref 0–44)
AST: 16 U/L (ref 15–41)
Albumin: 3.9 g/dL (ref 3.5–5.0)
Alkaline Phosphatase: 85 U/L (ref 38–126)
Anion gap: 9 (ref 5–15)
BUN: 22 mg/dL (ref 8–23)
CO2: 27 mmol/L (ref 22–32)
Calcium: 9.6 mg/dL (ref 8.9–10.3)
Chloride: 104 mmol/L (ref 98–111)
Creatinine: 1.09 mg/dL — ABNORMAL HIGH (ref 0.44–1.00)
GFR, Estimated: 51 mL/min — ABNORMAL LOW (ref >60)
Glucose, Bld: 164 mg/dL — ABNORMAL HIGH (ref 70–99)
Potassium: 4 mmol/L (ref 3.5–5.1)
Sodium: 140 mmol/L (ref 135–145)
Total Bilirubin: 0.3 mg/dL (ref 0.3–1.2)
Total Protein: 6.3 g/dL — ABNORMAL LOW (ref 6.5–8.1)

## 2021-03-19 MED ORDER — HEPARIN SOD (PORK) LOCK FLUSH 100 UNIT/ML IV SOLN
500.0000 [IU] | Freq: Once | INTRAVENOUS | Status: AC
  Administered 2021-03-19: 13:00:00 500 [IU] via INTRAVENOUS

## 2021-03-19 MED ORDER — MUPIROCIN CALCIUM 2 % EX CREA: 1.0000 "application " | TOPICAL_CREAM | Freq: Three times a day (TID) | CUTANEOUS | 1 refills | Status: DC

## 2021-03-19 MED ORDER — SODIUM CHLORIDE 0.9% FLUSH
10.0000 mL | Freq: Once | INTRAVENOUS | Status: AC
  Administered 2021-03-19: 13:00:00 10 mL via INTRAVENOUS

## 2021-03-19 NOTE — Progress Notes (Signed)
Patient was seen by the surgeon and a seroma was drained. She is on antibiotics. When assessed today, Dr Marin Olp felt like there was some degree of cellulitis to the axilla and that it would be prudent to hold treatment until signs of infection have cleared.   Oncology Nurse Navigator Documentation  Oncology Nurse Navigator Flowsheets 03/19/2021  Abnormal Finding Date -  Confirmed Diagnosis Date -  Diagnosis Status -  Planned Course of Treatment -  Phase of Treatment -  Chemotherapy Actual Start Date: -  Expected Surgery Date -  Surgery Actual Start Date: -  Navigator Follow Up Date: 03/26/2021  Navigator Follow Up Reason: Follow-up Appointment;Chemotherapy  Navigator Location [facility]  Referral Date to RadOnc/MedOnc -  Navigator Encounter Type Treatment;Appt/Treatment Plan Review  Telephone -  Treatment Initiated Date -  Patient Visit Type MedOnc  Treatment Phase Active Tx  Barriers/Navigation Needs Coordination of Care;Education  Education -  Interventions Psycho-Social Support  Acuity Level 2-Minimal Needs (1-2 Barriers Identified)  Referrals -  Coordination of Care -  Education Method -  Support Groups/Services Friends and Family  Specialty Items/DME -  Time Spent with Patient 15

## 2021-03-19 NOTE — Addendum Note (Signed)
Addended by: Shelda Altes on: 03/19/2021 01:28 PM   Modules accepted: Orders

## 2021-03-19 NOTE — Progress Notes (Signed)
Hematology and Oncology Follow Up Visit  Carmen [name] [id number] 08-09-1940 80 y.o. 03/19/2021   Principle Diagnosis:  Invasive ductal carcinoma of the left breast-stage IIA (T2N0M0) -- TRIPLE NEGATIVE Hemochromatosis (double heterozygote for C282Y and S65C mutations).  Current Therapy:         Phlebotomy to maintain ferritin less than 100 Adriamycin/Cytoxan --  s/p cycle #1-- started adjuvant therapy on 02/27/2021     Interim History:  [name] is back for follow-up.  Unfortunately, we may have to delay her chemotherapy for a week.  Looks like she has this seroma in the upper outer quadrant of the left breast.  This looks like you may have some infection.  It is red.  It is somewhat firm.  She says she had it drained a week ago.  She is on doxycycline.  I just worry about the fact that this could get infected and cause cellulitis if we give her chemotherapy right now.  She did well with the first cycle of chemotherapy.  Her hair is falling out.  She is having some discomfort in the back of the scalp.  She has had no problems with fever.  She has had few mouth sores..  There is been no diarrhea.  She has had no issues with nausea or vomiting.  Her weight is holding pretty steady.  She ate well over Christmas.  She has had no problems with leg swelling.  There is been no rashes in the legs.  She also has hemochromatosis.  Back in November we checked her iron studies, her ferritin was 59 with an iron saturation of 25%.  Overall, I would have to say that her performance status is probably ECOG 1.      Medications:  Current Outpatient Medications:    allopurinol (ZYLOPRIM) 100 MG tablet, TAKE  (1)  TABLET TWICE A DAY., Disp: 180 tablet, Rfl: 0   aspirin 81 MG tablet, Take 81 mg by mouth daily., Disp: , Rfl:    Cholecalciferol (VITAMIN D3) 250 MCG (10000 UT) TABS, Take by mouth 3 (three) times a week., Disp: , Rfl:    colchicine 0.6 MG tablet, TAKE (1) TABLET DAILY AS  NEEDED., Disp: 30 tablet, Rfl: 1   dexamethasone (DECADRON) 4 MG tablet, Take 2 tablets (8 mg total) by mouth daily. Take daily for 3 days after chemo. Take with food., Disp: 30 tablet, Rfl: 1   diclofenac Sodium (VOLTAREN) 1 % GEL, APPLY 4 GRAMS TO AFFECTED AREA(S) 4 TIMES A DAY AS NEEDED, Disp: 100 g, Rfl: 0   doxycycline (MONODOX) 100 MG capsule, Take 1 capsule by mouth 2 (two) times daily., Disp: , Rfl:    fluticasone (FLONASE) 50 MCG/ACT nasal spray, SPRAY 1 SPRAY IN EACH NOSTRIL ONCE DAILY., Disp: 16 g, Rfl: 4   furosemide (LASIX) 20 MG tablet, Take 1 tablet (20 mg total) by mouth daily., Disp: 30 tablet, Rfl: 0   glipiZIDE (GLUCOTROL) 10 MG tablet, Take 2 tablets (20 mg total) by mouth daily before breakfast AND 1 tablet (10 mg total) daily before supper., Disp: 270 tablet, Rfl: 3   glucose blood (ONE TOUCH ULTRA TEST) test strip, CHECK BLOOD SUGAR 2 TIMES A DAY, Disp: 100 each, Rfl: prn   HYDROcodone-acetaminophen (NORCO/VICODIN) 5-325 MG tablet, Take 1 tablet by mouth every 6 (six) hours as needed for moderate pain or severe pain., Disp: 15 tablet, Rfl: 0   icosapent Ethyl (VASCEPA) 1 g capsule, TAKE (2) CAPSULES TWICE DAILY., Disp: 360 capsule, Rfl:  3   insulin lispro (HUMALOG KWIKPEN) 100 UNIT/ML KwikPen, Max Daily 25 units, Disp: 15 mL, Rfl: 11   Insulin Pen Needle 32G X 4 MM MISC, 1 Device by Does not apply route 3 (three) times daily., Disp: 300 each, Rfl: 1   Lancets Misc. (ACCU-CHEK FASTCLIX LANCET) KIT, Use to check Blood Sugars, Disp: 1 kit, Rfl: 0   lidocaine-prilocaine (EMLA) cream, Apply 1 application topically as needed., Disp: 30 g, Rfl: 0   meclizine (ANTIVERT) 12.5 MG tablet, Take 1 tablet (12.5 mg total) by mouth 3 (three) times daily as needed for dizziness., Disp: 30 tablet, Rfl: 5   metoprolol tartrate (LOPRESSOR) 25 MG tablet, TAKE  (1)  TABLET TWICE A DAY., Disp: 180 tablet, Rfl: 3   mupirocin cream (BACTROBAN) 2 %, Apply 1 application topically 3 (three) times daily.,  Disp: 30 g, Rfl: 1   olmesartan (BENICAR) 20 MG tablet, Take 1 tablet (20 mg total) by mouth daily., Disp: 90 tablet, Rfl: 3   PRALUENT 150 MG/ML SOAJ, Inject 1 Dose into the skin every 14 (fourteen) days., Disp: 2 mL, Rfl: 11   pregabalin (LYRICA) 50 MG capsule, TAKE ONE CAPSULE BY MOUTH TWICE DAILY, Disp: 180 capsule, Rfl: 1   RESTASIS 0.05 % ophthalmic emulsion, Place 2 drops into both eyes 2 (two) times daily as needed., Disp: , Rfl:    Semaglutide (RYBELSUS) 7 MG TABS, Take 1 tablet by mouth daily before breakfast., Disp: 90 tablet, Rfl: 3   VOLTAREN 1 % GEL, APPLY 4 GRAMS TO AFFECTED AREA(S) 4 TIMES A DAY AS NEEDED, Disp: 100 g, Rfl: 0   ondansetron (ZOFRAN) 8 MG tablet, Take 1 tablet (8 mg total) by mouth 2 (two) times daily as needed. Start on the third day after chemotherapy. (Patient not taking: Reported on 03/19/2021), Disp: 30 tablet, Rfl: 1   prochlorperazine (COMPAZINE) 10 MG tablet, Take 1 tablet (10 mg total) by mouth every 6 (six) hours as needed (Nausea or vomiting). (Patient not taking: Reported on 03/19/2021), Disp: 30 tablet, Rfl: 1  Allergies:  Allergies  Allergen Reactions   Cymbalta [Duloxetine Hcl] Other (See Comments)    Profuse sweating   Farxiga [Dapagliflozin] Other (See Comments)    Dizziness   Januvia [Sitagliptin] Swelling   Meloxicam Nausea And Vomiting   Shellfish Allergy Other (See Comments)    Gout   Trulicity [Dulaglutide] Nausea Only   Crestor [Rosuvastatin] Other (See Comments)    Muscle aches on 5 mg daily and 10 mg twice weekly   Diclofenac Diarrhea   Lipitor [Atorvastatin] Other (See Comments)    Muscle aches   Livalo [Pitavastatin] Other (See Comments)        Sulfa Antibiotics Rash   Tape Other (See Comments)    Causes skin irritation   Zetia [Ezetimibe] Other (See Comments)    Muscle aches   Zocor [Simvastatin] Other (See Comments)    Muscle aches     Past Medical History, Surgical history, Social history, and Family History were  reviewed and updated.  Review of Systems: Review of Systems  Constitutional: Negative.   HENT: Negative.    Eyes: Negative.   Respiratory: Negative.    Cardiovascular: Negative.   Gastrointestinal: Negative.   Genitourinary: Negative.   Musculoskeletal: Negative.   Skin: Negative.   Neurological: Negative.   Endo/Heme/Allergies: Negative.   Psychiatric/Behavioral: Negative.     Physical Exam:  height is 5' 3"  (1.6 m) and weight is 177 lb 1.3 oz (80.3 kg). Her oral temperature is  99 F (37.2 C). Her blood pressure is 124/75 and her pulse is 105 (abnormal). Her respiration is 19 and oxygen saturation is 98%.   Physical Exam Vitals reviewed.  Constitutional:      Comments: Her breast exam shows right breast with no masses, edema or erythema.  There is no right axillary adenopathy.  Left breast shows the lumpectomy scar at about the 5 o'clock position.  This is healing.  Unfortunately, she has this area of seroma in the upper outer quadrant of the left breast.  There is erythema with this.  It is somewhat firm.  Is slightly tender.    HENT:     Head: Normocephalic and atraumatic.  Eyes:     Pupils: Pupils are equal, round, and reactive to light.  Cardiovascular:     Rate and Rhythm: Normal rate and regular rhythm.     Heart sounds: Normal heart sounds.  Pulmonary:     Effort: Pulmonary effort is normal.     Breath sounds: Normal breath sounds.  Abdominal:     General: Bowel sounds are normal.     Palpations: Abdomen is soft.  Musculoskeletal:        General: No tenderness or deformity. Normal range of motion.     Cervical back: Normal range of motion.  Lymphadenopathy:     Cervical: No cervical adenopathy.  Skin:    General: Skin is warm and dry.     Findings: No erythema or rash.  Neurological:     Mental Status: She is alert and oriented to person, place, and time.  Psychiatric:        Behavior: Behavior normal.        Thought Content: Thought content normal.         Judgment: Judgment normal.     Lab Results  Component Value Date   WBC 10.8 (H) 03/19/2021   HGB 13.2 03/19/2021   HCT 39.6 03/19/2021   MCV 91.9 03/19/2021   PLT 166 03/19/2021     Chemistry      Component Value Date/Time   NA 140 03/19/2021 1151   NA 147 (H) 01/20/2021 1446   NA 147 (H) 01/22/2017 1451   NA 140 12/31/2015 1052   K 4.0 03/19/2021 1151   K 4.9 (H) 01/22/2017 1451   K 4.2 12/31/2015 1052   CL 104 03/19/2021 1151   CL 106 01/22/2017 1451   CO2 27 03/19/2021 1151   CO2 29 01/22/2017 1451   CO2 23 12/31/2015 1052   BUN 22 03/19/2021 1151   BUN 18 01/20/2021 1446   BUN 23 (H) 01/22/2017 1451   BUN 21.4 12/31/2015 1052   CREATININE 1.09 (H) 03/19/2021 1151   CREATININE 1.6 (H) 01/22/2017 1451   CREATININE 1.4 (H) 12/31/2015 1052      Component Value Date/Time   CALCIUM 9.6 03/19/2021 1151   CALCIUM 10.3 01/22/2017 1451   CALCIUM 9.7 12/31/2015 1052   ALKPHOS 85 03/19/2021 1151   ALKPHOS 65 01/22/2017 1451   ALKPHOS 87 12/31/2015 1052   AST 16 03/19/2021 1151   AST 31 12/31/2015 1052   ALT 17 03/19/2021 1151   ALT 25 01/22/2017 1451   ALT 36 12/31/2015 1052   BILITOT 0.3 03/19/2021 1151   BILITOT 0.42 12/31/2015 1052      Impression and Plan: Ms. Pardy is 80 year old white female with a new diagnosis of stage  IIA infiltrating ductal carcinoma of the left breast.  Again, the tumor is triple negative.  Again,  we will hold the chemotherapy.  I just think it be wise for [name] to do this.  I do not want to see her get neutropenic and then have this wound developed into some bad cellulitis.  I do not see a problem with her being delayed 1 week for chemotherapy.  I did send in a prescription for Bactroban for her to place onto the wound.  This she will placed on the wound 3 times a day.  We will have her come back in 1 week.  Hopefully, the wound will be flicking a whole lot better we can resume treatment on her.   Volanda Napoleon,  MD 12/28/20223:51 PM

## 2021-03-19 NOTE — Patient Instructions (Signed)

## 2021-03-20 ENCOUNTER — Encounter: Payer: Self-pay | Admitting: Hematology & Oncology

## 2021-03-20 ENCOUNTER — Encounter: Payer: Self-pay | Admitting: Family

## 2021-03-21 ENCOUNTER — Inpatient Hospital Stay: Payer: Medicare Other

## 2021-03-21 ENCOUNTER — Other Ambulatory Visit: Payer: Self-pay | Admitting: Family Medicine

## 2021-03-21 DIAGNOSIS — Z951 Presence of aortocoronary bypass graft: Secondary | ICD-10-CM

## 2021-03-21 DIAGNOSIS — E1159 Type 2 diabetes mellitus with other circulatory complications: Secondary | ICD-10-CM

## 2021-03-21 DIAGNOSIS — I152 Hypertension secondary to endocrine disorders: Secondary | ICD-10-CM

## 2021-03-25 ENCOUNTER — Other Ambulatory Visit: Payer: Medicare Other

## 2021-03-26 ENCOUNTER — Other Ambulatory Visit: Payer: Medicare Other

## 2021-03-26 ENCOUNTER — Ambulatory Visit: Payer: Medicare Other | Admitting: Family

## 2021-03-26 ENCOUNTER — Encounter: Payer: Self-pay | Admitting: *Deleted

## 2021-03-26 ENCOUNTER — Ambulatory Visit: Payer: Medicare Other

## 2021-03-26 NOTE — Progress Notes (Signed)
Patient cancelled today's appointment for follow up and treatment. She is still working with her surgeon to clear up her cellulitis/seroma and is still taking antibiotics. She doesn't want to reschedule until she knows this complication has resolved.   Called to speak to patient. No answer. Voicemail left.   Oncology Nurse Navigator Documentation  Oncology Nurse Navigator Flowsheets 03/26/2021  Abnormal Finding Date -  Confirmed Diagnosis Date -  Diagnosis Status -  Planned Course of Treatment -  Phase of Treatment -  Chemotherapy Actual Start Date: -  Expected Surgery Date -  Surgery Actual Start Date: -  Navigator Follow Up Date: 04/01/2021  Navigator Follow Up Reason: Appointment Review  Navigator Location [facility]  Referral Date to RadOnc/MedOnc -  Navigator Encounter Type Appt/Treatment Plan Review;Telephone  Telephone Outgoing Call  Treatment Initiated Date -  Patient Visit Type MedOnc  Treatment Phase Active Tx  Barriers/Navigation Needs Coordination of Care;Education  Education -  Interventions -  Acuity Level 2-Minimal Needs (1-2 Barriers Identified)  Referrals -  Coordination of Care -  Education Method -  Support Groups/Services Friends and Family  Specialty Items/DME -  Time Spent with Patient 15

## 2021-03-26 NOTE — Progress Notes (Signed)
Patient called back. She is doing well. Despite her repeated drains of the seroma she feels like she is eating well and trying to stay active. Her next appointment will be 04/04/2021. She is hoping that at that surgical appointment, she will be cleared to start chemo once again. She knows to call if she has any needs.   Oncology Nurse Navigator Documentation  Oncology Nurse Navigator Flowsheets 03/26/2021  Abnormal Finding Date -  Confirmed Diagnosis Date -  Diagnosis Status -  Planned Course of Treatment -  Phase of Treatment -  Chemotherapy Actual Start Date: -  Expected Surgery Date -  Surgery Actual Start Date: -  Navigator Follow Up Date: -  Navigator Follow Up Reason: -  Navigator Location [facility]  Referral Date to RadOnc/MedOnc -  Navigator Encounter Type Telephone  Telephone Patient Update;Incoming Call  Treatment Initiated Date -  Patient Visit Type MedOnc  Treatment Phase Active Tx  Barriers/Navigation Needs Coordination of Care;Education  Education Pain/ Symptom Management  Interventions Education;Psycho-Social Support  Acuity Level 2-Minimal Needs (1-2 Barriers Identified)  Referrals -  Coordination of Care -  Education Method Verbal  Support Groups/Services Friends and Family  Specialty Items/DME -  Time Spent with Patient 15

## 2021-03-27 DIAGNOSIS — E1142 Type 2 diabetes mellitus with diabetic polyneuropathy: Secondary | ICD-10-CM | POA: Diagnosis not present

## 2021-03-27 DIAGNOSIS — L84 Corns and callosities: Secondary | ICD-10-CM | POA: Diagnosis not present

## 2021-03-27 DIAGNOSIS — M79676 Pain in unspecified toe(s): Secondary | ICD-10-CM | POA: Diagnosis not present

## 2021-03-27 DIAGNOSIS — B351 Tinea unguium: Secondary | ICD-10-CM | POA: Diagnosis not present

## 2021-03-31 ENCOUNTER — Other Ambulatory Visit: Payer: Self-pay

## 2021-03-31 ENCOUNTER — Ambulatory Visit: Payer: Medicare Other | Attending: General Surgery

## 2021-03-31 VITALS — Wt 176.0 lb

## 2021-03-31 DIAGNOSIS — Z483 Aftercare following surgery for neoplasm: Secondary | ICD-10-CM | POA: Insufficient documentation

## 2021-03-31 NOTE — Therapy (Signed)
[facility] @ [address] [place], [place], [address] Phone: [phone]   Fax:  [phone]  Physical Therapy Treatment  Patient Details  Name: Carmen [name] MRN: [id number] Date of Birth: [birth year] Referring Provider (PT): Dr. Marlou Starks   Encounter Date: 03/31/2021   PT End of Session - 03/31/21 1544     Visit Number 3   # unchanged due to screen only   PT Start Time 0998    PT Stop Time 3382    PT Time Calculation (min) 7 min             Past Medical History:  Diagnosis Date   Arthritis    Asymptomatic PVCs    Bilateral shoulder pain    CAD (coronary artery disease)    Cancer ([place]) 11/2015   [id number]  right upper arm   Chronic renal insufficiency, stage 3 (moderate) ([place])    Diabetes mellitus without complication ([place])    Diverticulosis    Endometrial polyp    External hemorrhoids    Goals of care, counseling/discussion 02/07/2021   Gout    Hemochromatosis, hereditary ([place]) 11/24/2012   History of anemia    History of duodenal ulcer 1990   Hyperlipidemia    Hypertension    Internal hemorrhoids    Jaundice    age 81 or 81   Myocardial infarction ([place]) 2007   Neuromuscular disorder ([place])    peripheral neuropathy   PMB (postmenopausal bleeding)    PONV (postoperative nausea and vomiting)    Prolapse of female pelvic organs    uses pessary   Seizures ([place])    had one at Dr. Antonieta Pert office after getting blood drawn   Stage I breast cancer, left ([place]) 12/16/2020   Tick bite 08/12/2017   had 3 bites   Type 2 diabetes mellitus with hyperglycemia, without long-term current use of insulin ([place]) 01/27/2019   Vitamin D deficiency     Past Surgical History:  Procedure Laterality Date   BREAST LUMPECTOMY WITH RADIOACTIVE SEED AND SENTINEL LYMPH NODE BIOPSY Left 01/14/2021   Procedure: LEFT BREAST LUMPECTOMY WITH RADIOACTIVE SEED AND SENTINEL LYMPH NODE BIOPSY;  Surgeon: Jovita Kussmaul, MD;  Location:  [facility];  Service: General;  Laterality: Left;   BREAST SURGERY     left breast lump--benign   CARDIAC CATHETERIZATION  06/03/2005   COLONOSCOPY  11/28/2001   CORONARY ARTERY BYPASS GRAFT  2007   x2 Dr. Roxan Hockey, [place] to LAD, [place]G to [place] N/A 09/28/2017   Procedure: [place];  Surgeon: Molli Posey, MD;  Location: [facility];  Service: Gynecology;  Laterality: N/A;   LIPOMA EXCISION     back   LIPOMA EXCISION Left 01/14/2021   Procedure: EXCISION LEFT CHEST WALL LIPOMA;  Surgeon: Jovita Kussmaul, MD;  Location: [facility];  Service: General;  Laterality: Left;   PORTACATH PLACEMENT Right 01/14/2021   Procedure: INSERTION PORT-A-CATH;  Surgeon: Jovita Kussmaul, MD;  Location: [facility];  Service: General;  Laterality: Right;   UPPER GI ENDOSCOPY  01/20/1989    Vitals:   03/31/21 1542  Weight: 176 lb (79.8 kg)     Subjective Assessment - 03/31/21 1543     Subjective Pt returnsfor her 3 month L-Dex screen.    Pertinent History Pt was diagnosed with Left Invasive Breast Cancer that is Triple Negative with a Ki67 of 40%.  She is s/p left lumpectomy with SLNB on 01/14/2021.Pt has hemochromatosis, DM,Melanoma arm, leg x 4, MI,open heart surgery,gout,Renal insufficiency                    L-DEX FLOWSHEETS - 03/31/21 1500       L-DEX LYMPHEDEMA SCREENING   Measurement Type Unilateral    L-DEX MEASUREMENT EXTREMITY Upper Extremity    POSITION  Standing    DOMINANT SIDE Right    At Risk Side Left    BASELINE SCORE (UNILATERAL) -1.6    L-DEX SCORE (UNILATERAL) 3    VALUE CHANGE (UNILAT) 4.6                                     PT Long Term Goals - 02/20/21 1110       PT LONG TERM GOAL #1   Title Pt will achieve left shoulder AROM within 5-7 degrees of baselines ROM for ability to perform  home activities    Status Achieved      PT LONG TERM GOAL #2   Title Pt will have decreased left axillary swelling by greater than 50%    Baseline Pt feels it is improved    Status Achieved      PT LONG TERM GOAL #3   Title Pt will be independent in self MLD for left axillary/breast swelling    Status Achieved      PT LONG TERM GOAL #4   Title Pts quick dash will return to pre-surgical baseline of 11%    Baseline Pt still has increased Quick DASH but feels it is due to her hand arthritis and not from recent surgery    Status Not Met                   Plan - 03/31/21 1549     Clinical Impression Statement Pt returns for her 3 month L-Dex screen. Her change from baseline of 4.6 is WNLs so no further treatment is required at this time except to cont every 3 month L-Dex screens which pt is agreeable to.    PT Next Visit Plan Cont every 3 months L-Dex screens for up to 2 years from her SLNB (~01/15/2023)    Consulted and Agree with Plan of Care Patient             Patient will benefit from skilled therapeutic intervention in order to improve the following deficits and impairments:     Visit Diagnosis: Aftercare following surgery for neoplasm     Problem List Patient Active Problem List   Diagnosis Date Noted   Goals of care, counseling/discussion 02/07/2021   Type 2 diabetes mellitus with hyperglycemia, without long-term current use of insulin ([place]) 01/20/2021   Current moderate episode of major depressive disorder without prior episode ([place]) 12/26/2020   Coronary artery disease of bypass graft of native heart with stable angina pectoris ([place]) 12/26/2020   Stage I breast cancer, left ([place]) 12/16/2020   Type 2 diabetes mellitus with diabetic polyneuropathy, without long-term current use of insulin ([place]) 12/29/2019   Type 2 diabetes mellitus with stage 3b chronic kidney disease, without long-term current use of insulin ([place]) 01/27/2019   Chronic renal insufficiency,  stage 3 (moderate) ([place]) 09/10/2017   Chronic pain of both shoulders 04/27/2017   Vitamin D deficiency 01/28/2016   Non-insulin treated type 2 diabetes mellitus ([place]) 11/12/2015   Statin intolerance  08/18/2013   Neuromuscular disorder ([place])    Hemochromatosis, hereditary ([place]) 11/24/2012   S/P CABG x 2 08/09/2012   PVC's (premature ventricular contractions) 08/09/2012   Gout 06/20/2012   Hypertension associated with diabetes ([place]) 06/20/2012   HLD (hyperlipidemia) 06/20/2012   Arthritis 06/20/2012    Otelia Limes, PTA 03/31/2021, 4:03 PM  [facility] @ [address] [place], [place], [address] Phone: [phone]   Fax:  [phone]  Name: Carmen [name] MRN: [id number] Date of Birth: [birth year]

## 2021-04-03 ENCOUNTER — Encounter: Payer: Self-pay | Admitting: Genetic Counselor

## 2021-04-03 DIAGNOSIS — Z1379 Encounter for other screening for genetic and chromosomal anomalies: Secondary | ICD-10-CM | POA: Insufficient documentation

## 2021-04-07 ENCOUNTER — Encounter: Payer: Self-pay | Admitting: *Deleted

## 2021-04-07 NOTE — Progress Notes (Signed)
Patient notifying this office that Dr Marlou Starks feels like patient is ready to restart her treatment after concluding his treatment for her infection/seroma.   Reviewed with Dr Marin Olp. He would like patient to come in this week to restart her treatment.   Scheduled patient for this Thursday. Called and spoke to patient. She is aware of appointments including date, time, and location.   Oncology Nurse Navigator Documentation  Oncology Nurse Navigator Flowsheets 04/07/2021  Abnormal Finding Date -  Confirmed Diagnosis Date -  Diagnosis Status -  Planned Course of Treatment -  Phase of Treatment -  Chemotherapy Actual Start Date: -  Expected Surgery Date -  Surgery Actual Start Date: -  Navigator Follow Up Date: 04/10/2021  Navigator Follow Up Reason: Follow-up Appointment;Chemotherapy  Navigator Location [facility]  Referral Date to RadOnc/MedOnc -  Navigator Encounter Type Appt/Treatment Plan Review;Telephone  Telephone Appt Confirmation/Clarification;Incoming Call  Treatment Initiated Date -  Patient Visit Type MedOnc  Treatment Phase Active Tx  Barriers/Navigation Needs Coordination of Care;Education  Education Pain/ Symptom Management  Interventions Coordination of Care;Education  Acuity Level 2-Minimal Needs (1-2 Barriers Identified)  Referrals -  Coordination of Care Appts  Education Method Verbal  Support Groups/Services Friends and Family  Specialty Items/DME -  Time Spent with Patient 2

## 2021-04-10 ENCOUNTER — Other Ambulatory Visit: Payer: Self-pay

## 2021-04-10 ENCOUNTER — Encounter: Payer: Self-pay | Admitting: *Deleted

## 2021-04-10 ENCOUNTER — Inpatient Hospital Stay: Payer: Medicare Other

## 2021-04-10 ENCOUNTER — Inpatient Hospital Stay: Payer: Medicare Other | Attending: Hematology & Oncology | Admitting: Family

## 2021-04-10 VITALS — BP 143/67 | HR 97 | Temp 98.4°F | Resp 16 | Ht 63.0 in | Wt 178.0 lb

## 2021-04-10 VITALS — BP 127/51 | HR 63

## 2021-04-10 DIAGNOSIS — C50912 Malignant neoplasm of unspecified site of left female breast: Secondary | ICD-10-CM | POA: Diagnosis not present

## 2021-04-10 DIAGNOSIS — Z171 Estrogen receptor negative status [ER-]: Secondary | ICD-10-CM | POA: Diagnosis not present

## 2021-04-10 DIAGNOSIS — Z5189 Encounter for other specified aftercare: Secondary | ICD-10-CM | POA: Diagnosis not present

## 2021-04-10 LAB — CBC WITH DIFFERENTIAL (CANCER CENTER ONLY)
Abs Immature Granulocytes: 0.05 10*3/uL (ref 0.00–0.07)
Basophils Absolute: 0.1 10*3/uL (ref 0.0–0.1)
Basophils Relative: 1 %
Eosinophils Absolute: 0.7 10*3/uL — ABNORMAL HIGH (ref 0.0–0.5)
Eosinophils Relative: 8 %
HCT: 40.2 % (ref 36.0–46.0)
Hemoglobin: 13.6 g/dL (ref 12.0–15.0)
Immature Granulocytes: 1 %
Lymphocytes Relative: 23 %
Lymphs Abs: 2 10*3/uL (ref 0.7–4.0)
MCH: 30.7 pg (ref 26.0–34.0)
MCHC: 33.8 g/dL (ref 30.0–36.0)
MCV: 90.7 fL (ref 80.0–100.0)
Monocytes Absolute: 0.6 10*3/uL (ref 0.1–1.0)
Monocytes Relative: 7 %
Neutro Abs: 5.2 10*3/uL (ref 1.7–7.7)
Neutrophils Relative %: 60 %
Platelet Count: 99 10*3/uL — ABNORMAL LOW (ref 150–400)
RBC: 4.43 MIL/uL (ref 3.87–5.11)
RDW: 14.4 % (ref 11.5–15.5)
WBC Count: 8.6 10*3/uL (ref 4.0–10.5)
nRBC: 0 % (ref 0.0–0.2)

## 2021-04-10 LAB — CMP (CANCER CENTER ONLY)
ALT: 14 U/L (ref 0–44)
AST: 19 U/L (ref 15–41)
Albumin: 4.2 g/dL (ref 3.5–5.0)
Alkaline Phosphatase: 66 U/L (ref 38–126)
Anion gap: 8 (ref 5–15)
BUN: 26 mg/dL — ABNORMAL HIGH (ref 8–23)
CO2: 30 mmol/L (ref 22–32)
Calcium: 10.2 mg/dL (ref 8.9–10.3)
Chloride: 101 mmol/L (ref 98–111)
Creatinine: 1.27 mg/dL — ABNORMAL HIGH (ref 0.44–1.00)
GFR, Estimated: 43 mL/min — ABNORMAL LOW (ref >60)
Glucose, Bld: 155 mg/dL — ABNORMAL HIGH (ref 70–99)
Potassium: 3.7 mmol/L (ref 3.5–5.1)
Sodium: 139 mmol/L (ref 135–145)
Total Bilirubin: 0.4 mg/dL (ref 0.3–1.2)
Total Protein: 6.6 g/dL (ref 6.5–8.1)

## 2021-04-10 MED ORDER — SODIUM CHLORIDE 0.9 % IV SOLN
10.0000 mg | Freq: Once | INTRAVENOUS | Status: AC
  Administered 2021-04-10: 10 mg via INTRAVENOUS
  Filled 2021-04-10: qty 10

## 2021-04-10 MED ORDER — SODIUM CHLORIDE 0.9 % IV SOLN: Freq: Once | INTRAVENOUS | Status: AC

## 2021-04-10 MED ORDER — SODIUM CHLORIDE 0.9 % IV SOLN
150.0000 mg | Freq: Once | INTRAVENOUS | Status: AC
  Administered 2021-04-10: 150 mg via INTRAVENOUS
  Filled 2021-04-10: qty 150

## 2021-04-10 MED ORDER — SODIUM CHLORIDE 0.9 % IV SOLN
600.0000 mg/m2 | Freq: Once | INTRAVENOUS | Status: AC
  Administered 2021-04-10: 1140 mg via INTRAVENOUS
  Filled 2021-04-10: qty 57

## 2021-04-10 MED ORDER — PALONOSETRON HCL INJECTION 0.25 MG/5ML
0.2500 mg | Freq: Once | INTRAVENOUS | Status: AC
  Administered 2021-04-10: 0.25 mg via INTRAVENOUS
  Filled 2021-04-10: qty 5

## 2021-04-10 MED ORDER — HEPARIN SOD (PORK) LOCK FLUSH 100 UNIT/ML IV SOLN
500.0000 [IU] | Freq: Once | INTRAVENOUS | Status: AC | PRN
  Administered 2021-04-10: 500 [IU]

## 2021-04-10 MED ORDER — SODIUM CHLORIDE 0.9% FLUSH
10.0000 mL | INTRAVENOUS | Status: DC | PRN
  Administered 2021-04-10: 10 mL

## 2021-04-10 MED ORDER — DOXORUBICIN HCL CHEMO IV INJECTION 2 MG/ML
54.0000 mg/m2 | Freq: Once | INTRAVENOUS | Status: AC
  Administered 2021-04-10: 102 mg via INTRAVENOUS
  Filled 2021-04-10: qty 51

## 2021-04-10 NOTE — Progress Notes (Signed)
Hematology and Oncology Follow Up Visit  Carmen [name] [id number] July 07, 1940 81 y.o. 04/10/2021   Principle Diagnosis:  Invasive ductal carcinoma of the left breast-stage IIA (T2N0M0) -- TRIPLE NEGATIVE Hemochromatosis (double heterozygote for C282Y and S65C mutations).   Current Therapy:  Phlebotomy to maintain ferritin less than 100 Adriamycin/Cytoxan - started adjuvant therapy on 02/27/2021, s/p cycle 1   Interim History:  Carmen [name] is here today for follow-up. She is doing well and has completed 3 rounds of antibiotic therapy for the left upper out quadrant seroma of the breast. She has been cleared to resume chemotherapy. The seroma is significantly decreased in size and there is no wound or drainage noted on exam. No redness or edema. She follows up with her PCP again in a month. No fever, chills, n/v, cough, rash, dizziness, SOB, chest pain, palpitations, abdominal pain or changes in bowel or bladder habits.  No swelling, tenderness in her extremities.  She has neuropathy in her feet that is unchanged from baseline.  No falls or syncope to report. She uses a cane when needed for added support when ambulating.  She is eating well and staying hydrated throughout the day. Her weight is stable at 178 lbs.   ECOG Performance Status: 1 - Symptomatic but completely ambulatory  Medications:  Allergies as of 04/10/2021       Reactions   Cymbalta [duloxetine Hcl] Other (See Comments)   Profuse sweating   Farxiga [dapagliflozin] Other (See Comments)   Dizziness   Januvia [sitagliptin] Swelling   Meloxicam Nausea And Vomiting   Shellfish Allergy Other (See Comments)   Gout   Trulicity [dulaglutide] Nausea Only   Crestor [rosuvastatin] Other (See Comments)   Muscle aches on 5 mg daily and 10 mg twice weekly   Diclofenac Diarrhea   Lipitor [atorvastatin] Other (See Comments)   Muscle aches   Livalo [pitavastatin] Other (See Comments)      Sulfa Antibiotics Rash   Tape  Other (See Comments)   Causes skin irritation   Zetia [ezetimibe] Other (See Comments)   Muscle aches   Zocor [simvastatin] Other (See Comments)   Muscle aches         Medication List        Accurate as of April 10, 2021 11:46 AM. If you have any questions, ask your nurse or doctor.          Accu-Chek [employer] Kit Use to check Blood Sugars   allopurinol 100 MG tablet Commonly known as: ZYLOPRIM TAKE  (1)  TABLET TWICE A DAY.   aspirin 81 MG tablet Take 81 mg by mouth daily.   colchicine 0.6 MG tablet TAKE (1) TABLET DAILY AS NEEDED.   dexamethasone 4 MG tablet Commonly known as: DECADRON Take 2 tablets (8 mg total) by mouth daily. Take daily for 3 days after chemo. Take with food.   fluticasone 50 MCG/ACT nasal spray Commonly known as: FLONASE SPRAY 1 SPRAY IN EACH NOSTRIL ONCE DAILY.   furosemide 20 MG tablet Commonly known as: LASIX TAKE 1 TABLET BY MOUTH EVERY DAY   glipiZIDE 10 MG tablet Commonly known as: GLUCOTROL Take 2 tablets (20 mg total) by mouth daily before breakfast AND 1 tablet (10 mg total) daily before supper.   glucose blood test strip Commonly known as: ONE TOUCH ULTRA TEST CHECK BLOOD SUGAR 2 TIMES A DAY   HYDROcodone-acetaminophen 5-325 MG tablet Commonly known as: NORCO/VICODIN Take 1 tablet by mouth every 6 (six) hours as needed for moderate  pain or severe pain.   icosapent Ethyl 1 g capsule Commonly known as: VASCEPA TAKE (2) CAPSULES TWICE DAILY.   insulin lispro 100 UNIT/ML KwikPen Commonly known as: HumaLOG KwikPen Max Daily 25 units   Insulin Pen Needle 32G X 4 MM Misc 1 Device by Does not apply route 3 (three) times daily.   lidocaine-prilocaine cream Commonly known as: EMLA Apply 1 application topically as needed.   meclizine 12.5 MG tablet Commonly known as: ANTIVERT Take 1 tablet (12.5 mg total) by mouth 3 (three) times daily as needed for dizziness.   metoprolol tartrate 25 MG tablet Commonly known  as: LOPRESSOR TAKE  (1)  TABLET TWICE A DAY.   mupirocin cream 2 % Commonly known as: Bactroban Apply 1 application topically 3 (three) times daily.   olmesartan 20 MG tablet Commonly known as: BENICAR Take 1 tablet (20 mg total) by mouth daily.   ondansetron 8 MG tablet Commonly known as: Zofran Take 1 tablet (8 mg total) by mouth 2 (two) times daily as needed. Start on the third day after chemotherapy.   Praluent 150 MG/ML Soaj Generic drug: Alirocumab Inject 1 Dose into the skin every 14 (fourteen) days.   pregabalin 50 MG capsule Commonly known as: LYRICA TAKE ONE CAPSULE BY MOUTH TWICE DAILY   prochlorperazine 10 MG tablet Commonly known as: COMPAZINE Take 1 tablet (10 mg total) by mouth every 6 (six) hours as needed (Nausea or vomiting).   Restasis 0.05 % ophthalmic emulsion Generic drug: cycloSPORINE Place 2 drops into both eyes 2 (two) times daily as needed.   Rybelsus 7 MG Tabs Generic drug: Semaglutide Take 1 tablet by mouth daily before breakfast.   Vitamin D3 250 MCG (10000 UT) Tabs Take by mouth 3 (three) times a week.   Voltaren 1 % Gel Generic drug: diclofenac Sodium APPLY 4 GRAMS TO AFFECTED AREA(S) 4 TIMES A DAY AS NEEDED   diclofenac Sodium 1 % Gel Commonly known as: VOLTAREN APPLY 4 GRAMS TO AFFECTED AREA(S) 4 TIMES A DAY AS NEEDED        Allergies:  Allergies  Allergen Reactions   Cymbalta [Duloxetine Hcl] Other (See Comments)    Profuse sweating   Farxiga [Dapagliflozin] Other (See Comments)    Dizziness   Januvia [Sitagliptin] Swelling   Meloxicam Nausea And Vomiting   Shellfish Allergy Other (See Comments)    Gout   Trulicity [Dulaglutide] Nausea Only   Crestor [Rosuvastatin] Other (See Comments)    Muscle aches on 5 mg daily and 10 mg twice weekly   Diclofenac Diarrhea   Lipitor [Atorvastatin] Other (See Comments)    Muscle aches   Livalo [Pitavastatin] Other (See Comments)        Sulfa Antibiotics Rash   Tape Other (See  Comments)    Causes skin irritation   Zetia [Ezetimibe] Other (See Comments)    Muscle aches   Zocor [Simvastatin] Other (See Comments)    Muscle aches     Past Medical History, Surgical history, Social history, and Family History were reviewed and updated.  Review of Systems: All other 10 point review of systems is negative.   Physical Exam:  height is $RemoveB'5\' 3"'PYPnVTrt$  (1.6 m) and weight is 178 lb (80.7 kg). Her oral temperature is 98.4 F (36.9 C). Her blood pressure is 143/67 (abnormal) and her pulse is 97. Her respiration is 16 and oxygen saturation is 96%.   Wt Readings from Last 3 Encounters:  04/10/21 178 lb (80.7 kg)  03/31/21 176 lb (  79.8 kg)  03/19/21 177 lb 1.3 oz (80.3 kg)    Ocular: Sclerae unicteric, pupils equal, round and reactive to light Ear-nose-throat: Oropharynx clear, dentition fair Lymphatic: No cervical, supraclavicular or axillary adenopathy Lungs no rales or rhonchi, good excursion bilaterally Heart regular rate and rhythm, no murmur appreciated Abd soft, nontender, positive bowel sounds MSK no focal spinal tenderness, no joint edema Neuro: non-focal, well-oriented, appropriate affect Breasts: Bilateral breast exam was unremarkable. Left outer quadrant seroma continues to resolve nicely. No new mass, no lesion or rash noted.  Lab Results  Component Value Date   WBC 10.8 (H) 03/19/2021   HGB 13.2 03/19/2021   HCT 39.6 03/19/2021   MCV 91.9 03/19/2021   PLT 166 03/19/2021   Lab Results  Component Value Date   FERRITIN 59 02/07/2021   IRON 72 02/07/2021   TIBC 288 02/07/2021   UIBC 215 02/07/2021   IRONPCTSAT 25 02/07/2021   Lab Results  Component Value Date   RBC 4.31 03/19/2021   No results found for: KPAFRELGTCHN, LAMBDASER, KAPLAMBRATIO No results found for: IGGSERUM, IGA, IGMSERUM No results found for: Odetta Pink, SPEI   Chemistry      Component Value Date/Time   NA 140 03/19/2021 1151    NA 147 (H) 01/20/2021 1446   NA 147 (H) 01/22/2017 1451   NA 140 12/31/2015 1052   K 4.0 03/19/2021 1151   K 4.9 (H) 01/22/2017 1451   K 4.2 12/31/2015 1052   CL 104 03/19/2021 1151   CL 106 01/22/2017 1451   CO2 27 03/19/2021 1151   CO2 29 01/22/2017 1451   CO2 23 12/31/2015 1052   BUN 22 03/19/2021 1151   BUN 18 01/20/2021 1446   BUN 23 (H) 01/22/2017 1451   BUN 21.4 12/31/2015 1052   CREATININE 1.09 (H) 03/19/2021 1151   CREATININE 1.6 (H) 01/22/2017 1451   CREATININE 1.4 (H) 12/31/2015 1052      Component Value Date/Time   CALCIUM 9.6 03/19/2021 1151   CALCIUM 10.3 01/22/2017 1451   CALCIUM 9.7 12/31/2015 1052   ALKPHOS 85 03/19/2021 1151   ALKPHOS 65 01/22/2017 1451   ALKPHOS 87 12/31/2015 1052   AST 16 03/19/2021 1151   AST 31 12/31/2015 1052   ALT 17 03/19/2021 1151   ALT 25 01/22/2017 1451   ALT 36 12/31/2015 1052   BILITOT 0.3 03/19/2021 1151   BILITOT 0.42 12/31/2015 1052       Impression and Plan: Ms. Novitsky is a very pleasant 81 yo caucasian female with stage IIA infiltrating ductal carcinoma of the left breast, triple negative.  We will proceed with cycle 2 today as planned.  Follow-up in 3 weeks.   Lottie Dawson, NP 1/19/202311:46 AM

## 2021-04-10 NOTE — Patient Instructions (Signed)
[facility]  Discharge Instructions: Thank you for choosing [facility] to provide your oncology and hematology care.   If you have a lab appointment with the [facility], please go directly to the [facility] and check in at the registration area.  Wear comfortable clothing and clothing appropriate for easy access to any Portacath or PICC line.   We strive to give you quality time with your provider. You may need to reschedule your appointment if you arrive late (15 or more minutes).  Arriving late affects you and other patients whose appointments are after yours.  Also, if you miss three or more appointments without notifying the office, you may be dismissed from the clinic at the providers discretion.      For prescription refill requests, have your pharmacy contact our office and allow 72 hours for refills to be completed.    Today you received the following chemotherapy and/or immunotherapy agents adriamycin, cytoxan, emend, decadron   To help prevent nausea and vomiting after your treatment, we encourage you to take your nausea medication as directed.  BELOW ARE SYMPTOMS THAT SHOULD BE REPORTED IMMEDIATELY: *FEVER GREATER THAN 100.4 F (38 C) OR HIGHER *CHILLS OR SWEATING *NAUSEA AND VOMITING THAT IS NOT CONTROLLED WITH YOUR NAUSEA MEDICATION *UNUSUAL SHORTNESS OF BREATH *UNUSUAL BRUISING OR BLEEDING *URINARY PROBLEMS (pain or burning when urinating, or frequent urination) *BOWEL PROBLEMS (unusual diarrhea, constipation, pain near the anus) TENDERNESS IN MOUTH AND THROAT WITH OR WITHOUT PRESENCE OF ULCERS (sore throat, sores in mouth, or a toothache) UNUSUAL RASH, SWELLING OR PAIN  UNUSUAL VAGINAL DISCHARGE OR ITCHING   Items with * indicate a potential emergency and should be followed up as soon as possible or go to the Emergency Department if any problems should occur.  Please show the CHEMOTHERAPY ALERT CARD or IMMUNOTHERAPY ALERT  CARD at check-in to the Emergency Department and triage nurse. Should you have questions after your visit or need to cancel or reschedule your appointment, please contact [facility]  [phone] and follow the prompts.  Office hours are 8:00 a.m. to 4:30 p.m. Monday - Friday. Please note that voicemails left after 4:00 p.m. may not be returned until the following business day.  We are closed weekends and major holidays. You have access to a nurse at all times for urgent questions. Please call the main number to the clinic [phone] and follow the prompts.  For any non-urgent questions, you may also contact your provider using MyChart. We now offer e-Visits for anyone 75 and older to request care online for non-urgent symptoms. For details visit [email or web address].   Also download the MyChart app! Go to the app store, search "MyChart", open the app, select [facility], and log in with your MyChart username and password.  Due to Covid, a mask is required upon entering the hospital/clinic. If you do not have a mask, one will be given to you upon arrival. For doctor visits, patients may have 1 support person aged 47 or older with them. For treatment visits, patients cannot have anyone with them due to current Covid guidelines and our immunocompromised population.

## 2021-04-10 NOTE — Progress Notes (Signed)
Visited with patient after her provider appointment. She is doing well. Her seroma is mostly resolved. She has lost all her hair which she says is "difficult" but tries to rebound stating "it will grown back". She has no questions or concerns at this time.   Oncology Nurse Navigator Documentation  Oncology Nurse Navigator Flowsheets 04/10/2021  Abnormal Finding Date -  Confirmed Diagnosis Date -  Diagnosis Status -  Planned Course of Treatment -  Phase of Treatment -  Chemotherapy Actual Start Date: -  Expected Surgery Date -  Surgery Actual Start Date: -  Navigator Follow Up Date: -  Navigator Follow Up Reason: Follow-up Appointment;Chemotherapy  Navigator Location [facility]  Referral Date to RadOnc/MedOnc -  Navigator Encounter Type Treatment  Telephone -  Treatment Initiated Date -  Patient Visit Type MedOnc  Treatment Phase Active Tx  Barriers/Navigation Needs Coordination of Care;Education  Education -  Interventions Psycho-Social Support  Acuity Level 2-Minimal Needs (1-2 Barriers Identified)  Referrals -  Coordination of Care -  Education Method -  Support Groups/Services Friends and Family  Specialty Items/DME -  Time Spent with Patient 30

## 2021-04-10 NOTE — Patient Instructions (Signed)
[facility]  Discharge Instructions: Thank you for choosing [facility] to provide your oncology and hematology care.   If you have a lab appointment with the [facility], please go directly to the [facility] and check in at the registration area.  Wear comfortable clothing and clothing appropriate for easy access to any Portacath or PICC line.   We strive to give you quality time with your provider. You may need to reschedule your appointment if you arrive late (15 or more minutes).  Arriving late affects you and other patients whose appointments are after yours.  Also, if you miss three or more appointments without notifying the office, you may be dismissed from the clinic at the providers discretion.      For prescription refill requests, have your pharmacy contact our office and allow 72 hours for refills to be completed.    Today you received the following chemotherapy and/or immunotherapy agents port flush     To help prevent nausea and vomiting after your treatment, we encourage you to take your nausea medication as directed.  BELOW ARE SYMPTOMS THAT SHOULD BE REPORTED IMMEDIATELY: *FEVER GREATER THAN 100.4 F (38 C) OR HIGHER *CHILLS OR SWEATING *NAUSEA AND VOMITING THAT IS NOT CONTROLLED WITH YOUR NAUSEA MEDICATION *UNUSUAL SHORTNESS OF BREATH *UNUSUAL BRUISING OR BLEEDING *URINARY PROBLEMS (pain or burning when urinating, or frequent urination) *BOWEL PROBLEMS (unusual diarrhea, constipation, pain near the anus) TENDERNESS IN MOUTH AND THROAT WITH OR WITHOUT PRESENCE OF ULCERS (sore throat, sores in mouth, or a toothache) UNUSUAL RASH, SWELLING OR PAIN  UNUSUAL VAGINAL DISCHARGE OR ITCHING   Items with * indicate a potential emergency and should be followed up as soon as possible or go to the Emergency Department if any problems should occur.  Please show the CHEMOTHERAPY ALERT CARD or IMMUNOTHERAPY ALERT CARD at check-in to the  Emergency Department and triage nurse. Should you have questions after your visit or need to cancel or reschedule your appointment, please contact [facility]  [phone] and follow the prompts.  Office hours are 8:00 a.m. to 4:30 p.m. Monday - Friday. Please note that voicemails left after 4:00 p.m. may not be returned until the following business day.  We are closed weekends and major holidays. You have access to a nurse at all times for urgent questions. Please call the main number to the clinic [phone] and follow the prompts.  For any non-urgent questions, you may also contact your provider using MyChart. We now offer e-Visits for anyone 42 and older to request care online for non-urgent symptoms. For details visit [email or web address].   Also download the MyChart app! Go to the app store, search "MyChart", open the app, select [facility], and log in with your MyChart username and password.  Due to Covid, a mask is required upon entering the hospital/clinic. If you do not have a mask, one will be given to you upon arrival. For doctor visits, patients may have 1 support person aged 65 or older with them. For treatment visits, patients cannot have anyone with them due to current Covid guidelines and our immunocompromised population.

## 2021-04-11 ENCOUNTER — Inpatient Hospital Stay: Payer: Medicare Other

## 2021-04-11 VITALS — BP 128/58 | HR 91 | Temp 98.3°F | Resp 18

## 2021-04-11 DIAGNOSIS — Z5189 Encounter for other specified aftercare: Secondary | ICD-10-CM | POA: Diagnosis not present

## 2021-04-11 DIAGNOSIS — C50912 Malignant neoplasm of unspecified site of left female breast: Secondary | ICD-10-CM | POA: Diagnosis not present

## 2021-04-11 DIAGNOSIS — Z171 Estrogen receptor negative status [ER-]: Secondary | ICD-10-CM | POA: Diagnosis not present

## 2021-04-11 MED ORDER — PEGFILGRASTIM-BMEZ 6 MG/0.6ML ~~LOC~~ SOSY
6.0000 mg | PREFILLED_SYRINGE | Freq: Once | SUBCUTANEOUS | Status: AC
  Administered 2021-04-11: 6 mg via SUBCUTANEOUS
  Filled 2021-04-11: qty 0.6

## 2021-04-11 NOTE — Patient Instructions (Signed)

## 2021-04-14 ENCOUNTER — Ambulatory Visit (INDEPENDENT_AMBULATORY_CARE_PROVIDER_SITE_OTHER): Payer: Medicare Other

## 2021-04-14 VITALS — Ht 63.0 in | Wt 178.0 lb

## 2021-04-14 DIAGNOSIS — Z0000 Encounter for general adult medical examination without abnormal findings: Secondary | ICD-10-CM | POA: Diagnosis not present

## 2021-04-14 NOTE — Patient Instructions (Signed)
Carmen [name] , Thank you for taking time to come for your Medicare Wellness Visit. I appreciate your ongoing commitment to your health goals. Please review the following plan we discussed and let me know if I can assist you in the future.   Screening recommendations/referrals: Colonoscopy: Done 10/20/2012 - no repeat Mammogram: Done 11/29/2020 - repeat every 6 months Bone Density: done 11/08/2017 - Repeat every 2 years *due Recommended yearly ophthalmology/optometry visit for glaucoma screening and checkup Recommended yearly dental visit for hygiene and checkup  Vaccinations: Influenza vaccine: done 01/20/2021 - Repeat annually  Pneumococcal vaccine: Done 2012 - other dates? Tdap vaccine: Due Shingles vaccine: Zostavax Done 01/24/2015 - due for Shingrix   Covid-19: Done 04/16/2019, 04/24/2019, 01/20/2020, 10/05/2020  Advanced directives: Advance directive discussed with you today. Even though you declined this today, please call our office should you change your mind, and we can give you the proper paperwork for you to fill out.   Conditions/risks identified: Aim for 30 minutes of exercise or brisk walking each day, drink 6-8 glasses of water and eat lots of fruits and vegetables.   Next appointment: Follow up in one year for your annual wellness visit    Preventive Care 65 Years and Older, Female Preventive care refers to lifestyle choices and visits with your health care provider that can promote health and wellness. What does preventive care include? A yearly physical exam. This is also called an annual well check. Dental exams once or twice a year. Routine eye exams. Ask your health care provider how often you should have your eyes checked. Personal lifestyle choices, including: Daily care of your teeth and gums. Regular physical activity. Eating a healthy diet. Avoiding tobacco and drug use. Limiting alcohol use. Practicing safe sex. Taking low-dose aspirin every day. Taking vitamin  and mineral supplements as recommended by your health care provider. What happens during an annual well check? The services and screenings done by your health care provider during your annual well check will depend on your age, overall health, lifestyle risk factors, and family history of disease. Counseling  Your health care provider may ask you questions about your: Alcohol use. Tobacco use. Drug use. Emotional well-being. Home and relationship well-being. Sexual activity. Eating habits. History of falls. Memory and ability to understand (cognition). Work and work Statistician. Reproductive health. Screening  You may have the following tests or measurements: Height, weight, and BMI. Blood pressure. Lipid and cholesterol levels. These may be checked every 5 years, or more frequently if you are over 66 years old. Skin check. Lung cancer screening. You may have this screening every year starting at age 36 if you have a 30-pack-year history of smoking and currently smoke or have quit within the past 15 years. Fecal occult blood test (FOBT) of the stool. You may have this test every year starting at age 18. Flexible sigmoidoscopy or colonoscopy. You may have a sigmoidoscopy every 5 years or a colonoscopy every 10 years starting at age 69. Hepatitis C blood test. Hepatitis B blood test. Sexually transmitted disease (STD) testing. Diabetes screening. This is done by checking your blood sugar (glucose) after you have not eaten for a while (fasting). You may have this done every 1-3 years. Bone density scan. This is done to screen for osteoporosis. You may have this done starting at age 45. Mammogram. This may be done every 1-2 years. Talk to your health care provider about how often you should have regular mammograms. Talk with your health care provider  about your test results, treatment options, and if necessary, the need for more tests. Vaccines  Your health care provider may recommend  certain vaccines, such as: Influenza vaccine. This is recommended every year. Tetanus, diphtheria, and acellular pertussis (Tdap, Td) vaccine. You may need a Td booster every 10 years. Zoster vaccine. You may need this after age 19. Pneumococcal 13-valent conjugate (PCV13) vaccine. One dose is recommended after age 72. Pneumococcal polysaccharide (PPSV23) vaccine. One dose is recommended after age 66. Talk to your health care provider about which screenings and vaccines you need and how often you need them. This information is not intended to replace advice given to you by your health care provider. Make sure you discuss any questions you have with your health care provider. Document Released: 04/05/2015 Document Revised: 11/27/2015 Document Reviewed: 01/08/2015 Elsevier Interactive Patient Education  2017 [place] Prevention in the Home Falls can cause injuries. They can happen to people of all ages. There are many things you can do to make your home safe and to help prevent falls. What can I do on the outside of my home? Regularly fix the edges of walkways and driveways and fix any cracks. Remove anything that might make you trip as you walk through a door, such as a raised step or threshold. Trim any bushes or trees on the path to your home. Use bright outdoor lighting. Clear any walking paths of anything that might make someone trip, such as rocks or tools. Regularly check to see if handrails are loose or broken. Make sure that both sides of any steps have handrails. Any raised decks and porches should have guardrails on the edges. Have any leaves, snow, or ice cleared regularly. Use sand or salt on walking paths during winter. Clean up any spills in your garage right away. This includes oil or grease spills. What can I do in the bathroom? Use night lights. Install grab bars by the toilet and in the tub and shower. Do not use towel bars as grab bars. Use non-skid mats or  decals in the tub or shower. If you need to sit down in the shower, use a plastic, non-slip stool. Keep the floor dry. Clean up any water that spills on the floor as soon as it happens. Remove soap buildup in the tub or shower regularly. Attach bath mats securely with double-sided non-slip rug tape. Do not have throw rugs and other things on the floor that can make you trip. What can I do in the bedroom? Use night lights. Make sure that you have a light by your bed that is easy to reach. Do not use any sheets or blankets that are too big for your bed. They should not hang down onto the floor. Have a firm chair that has side arms. You can use this for support while you get dressed. Do not have throw rugs and other things on the floor that can make you trip. What can I do in the kitchen? Clean up any spills right away. Avoid walking on wet floors. Keep items that you use a lot in easy-to-reach places. If you need to reach something above you, use a strong step stool that has a grab bar. Keep electrical cords out of the way. Do not use floor polish or wax that makes floors slippery. If you must use wax, use non-skid floor wax. Do not have throw rugs and other things on the floor that can make you trip. What can I do  with my stairs? Do not leave any items on the stairs. Make sure that there are handrails on both sides of the stairs and use them. Fix handrails that are broken or loose. Make sure that handrails are as long as the stairways. Check any carpeting to make sure that it is firmly attached to the stairs. Fix any carpet that is loose or worn. Avoid having throw rugs at the top or bottom of the stairs. If you do have throw rugs, attach them to the floor with carpet tape. Make sure that you have a light switch at the top of the stairs and the bottom of the stairs. If you do not have them, ask someone to add them for you. What else can I do to help prevent falls? Wear shoes that: Do not  have high heels. Have rubber bottoms. Are comfortable and fit you well. Are closed at the toe. Do not wear sandals. If you use a stepladder: Make sure that it is fully opened. Do not climb a closed stepladder. Make sure that both sides of the stepladder are locked into place. Ask someone to hold it for you, if possible. Clearly mark and make sure that you can see: Any grab bars or handrails. First and last steps. Where the edge of each step is. Use tools that help you move around (mobility aids) if they are needed. These include: Canes. Walkers. Scooters. Crutches. Turn on the lights when you go into a dark area. Replace any light bulbs as soon as they burn out. Set up your furniture so you have a clear path. Avoid moving your furniture around. If any of your floors are uneven, fix them. If there are any pets around you, be aware of where they are. Review your medicines with your doctor. Some medicines can make you feel dizzy. This can increase your chance of falling. Ask your doctor what other things that you can do to help prevent falls. This information is not intended to replace advice given to you by your health care provider. Make sure you discuss any questions you have with your health care provider. Document Released: 01/03/2009 Document Revised: 08/15/2015 Document Reviewed: 04/13/2014 Elsevier Interactive Patient Education  2017 [employer].

## 2021-04-14 NOTE — Progress Notes (Signed)
Subjective:   Carmen [name] is a 81 y.o. female who presents for Medicare Annual (Subsequent) preventive examination.  Virtual Visit via Telephone Note  I connected with  Carmen [name] on 04/14/21 at  1:15 PM EST by telephone and verified that I am speaking with the correct person using two identifiers.  Location: Patient: Home Provider: [facility] Persons participating in the virtual visit: patient/Nurse Health Advisor   I discussed the limitations, risks, security and privacy concerns of performing an evaluation and management service by telephone and the availability of in person appointments. The patient expressed understanding and agreed to proceed.  Interactive audio and video telecommunications were attempted between this nurse and patient, however failed, due to patient having technical difficulties OR patient did not have access to video capability.  We continued and completed visit with audio only.  Some vital signs may be absent or patient reported.   [name] E [name], LPN   Review of Systems     Cardiac Risk Factors include: advanced age (>37mn, >>40women);diabetes mellitus;obesity (BMI >30kg/m2);sedentary lifestyle;dyslipidemia;hypertension;Other (see comment), Risk factor comments: hemochromatosis, CAD, hx of CABG     Objective:    Today's Vitals   04/14/21 1319 04/14/21 1321  Weight: 178 lb (80.7 kg)   Height: 5' 3"  (1.6 m)   PainSc:  0-No pain   Body mass index is 31.53 kg/m.  Advanced Directives 04/14/2021 04/10/2021 03/19/2021 02/07/2021 01/14/2021 01/02/2021 12/16/2020  Does Patient Have a Medical Advance Directive? No No No No No No No  Does patient want to make changes to medical advance directive? - - - - - - -  Would patient like information on creating a medical advance directive? No - Patient declined - No - Patient declined No - Patient declined No - Patient declined No - Patient declined No - Patient declined    Current Medications  (verified) Outpatient Encounter Medications as of 04/14/2021  Medication Sig   allopurinol (ZYLOPRIM) 100 MG tablet TAKE  (1)  TABLET TWICE A DAY.   aspirin 81 MG tablet Take 81 mg by mouth daily.   Cholecalciferol (VITAMIN D3) 250 MCG (10000 UT) TABS Take by mouth 3 (three) times a week.   colchicine 0.6 MG tablet TAKE (1) TABLET DAILY AS NEEDED.   dexamethasone (DECADRON) 4 MG tablet Take 2 tablets (8 mg total) by mouth daily. Take daily for 3 days after chemo. Take with food.   diclofenac Sodium (VOLTAREN) 1 % GEL APPLY 4 GRAMS TO AFFECTED AREA(S) 4 TIMES A DAY AS NEEDED   fluticasone (FLONASE) 50 MCG/ACT nasal spray SPRAY 1 SPRAY IN EACH NOSTRIL ONCE DAILY.   furosemide (LASIX) 20 MG tablet TAKE 1 TABLET BY MOUTH EVERY DAY   glipiZIDE (GLUCOTROL) 10 MG tablet Take 2 tablets (20 mg total) by mouth daily before breakfast AND 1 tablet (10 mg total) daily before supper.   glucose blood (ONE TOUCH ULTRA TEST) test strip CHECK BLOOD SUGAR 2 TIMES A DAY   HYDROcodone-acetaminophen (NORCO/VICODIN) 5-325 MG tablet Take 1 tablet by mouth every 6 (six) hours as needed for moderate pain or severe pain.   icosapent Ethyl (VASCEPA) 1 g capsule TAKE (2) CAPSULES TWICE DAILY.   insulin lispro (HUMALOG KWIKPEN) 100 UNIT/ML KwikPen Max Daily 25 units   Insulin Pen Needle 32G X 4 MM MISC 1 Device by Does not apply route 3 (three) times daily.   Lancets Misc. (ACCU-CHEK FASTCLIX LANCET) KIT Use to check Blood Sugars   lidocaine-prilocaine (EMLA) cream Apply 1 application  topically as needed.   meclizine (ANTIVERT) 12.5 MG tablet Take 1 tablet (12.5 mg total) by mouth 3 (three) times daily as needed for dizziness.   metoprolol tartrate (LOPRESSOR) 25 MG tablet TAKE  (1)  TABLET TWICE A DAY.   mupirocin cream (BACTROBAN) 2 % Apply 1 application topically 3 (three) times daily.   olmesartan (BENICAR) 20 MG tablet Take 1 tablet (20 mg total) by mouth daily.   ondansetron (ZOFRAN) 8 MG tablet Take 1 tablet (8 mg  total) by mouth 2 (two) times daily as needed. Start on the third day after chemotherapy.   PRALUENT 150 MG/ML SOAJ Inject 1 Dose into the skin every 14 (fourteen) days.   pregabalin (LYRICA) 50 MG capsule TAKE ONE CAPSULE BY MOUTH TWICE DAILY   prochlorperazine (COMPAZINE) 10 MG tablet Take 1 tablet (10 mg total) by mouth every 6 (six) hours as needed (Nausea or vomiting).   RESTASIS 0.05 % ophthalmic emulsion Place 2 drops into both eyes 2 (two) times daily as needed.   Semaglutide (RYBELSUS) 7 MG TABS Take 1 tablet by mouth daily before breakfast.   VOLTAREN 1 % GEL APPLY 4 GRAMS TO AFFECTED AREA(S) 4 TIMES A DAY AS NEEDED   No facility-administered encounter medications on file as of 04/14/2021.    Allergies (verified) Cymbalta [duloxetine hcl], Farxiga [dapagliflozin], Januvia [sitagliptin], Meloxicam, Shellfish allergy, Trulicity [dulaglutide], Crestor [rosuvastatin], Diclofenac, Lipitor [atorvastatin], Livalo [pitavastatin], Sulfa antibiotics, Tape, Zetia [ezetimibe], and Zocor [simvastatin]   History: Past Medical History:  Diagnosis Date   Arthritis    Asymptomatic PVCs    Bilateral shoulder pain    CAD (coronary artery disease)    Cancer ([place]) 11/2015   melanomax4  right upper arm   Chronic renal insufficiency, stage 3 (moderate) ([place])    Diabetes mellitus without complication ([place])    Diverticulosis    Endometrial polyp    External hemorrhoids    Goals of care, counseling/discussion 02/07/2021   Gout    Hemochromatosis, hereditary ([place]) 11/24/2012   History of anemia    History of duodenal ulcer 1990   Hyperlipidemia    Hypertension    Internal hemorrhoids    Jaundice    age 67 or 1   Myocardial infarction ([place]) 2007   Neuromuscular disorder ([place])    peripheral neuropathy   PMB (postmenopausal bleeding)    PONV (postoperative nausea and vomiting)    Prolapse of female pelvic organs    uses pessary   Seizures ([place])    had one at Dr. Antonieta Pert office after getting  blood drawn   Stage I breast cancer, left ([place]) 12/16/2020   Tick bite 08/12/2017   had 3 bites   Type 2 diabetes mellitus with hyperglycemia, without long-term current use of insulin ([place]) 01/27/2019   Vitamin D deficiency    Past Surgical History:  Procedure Laterality Date   BREAST LUMPECTOMY WITH RADIOACTIVE SEED AND SENTINEL LYMPH NODE BIOPSY Left 01/14/2021   Procedure: LEFT BREAST LUMPECTOMY WITH RADIOACTIVE SEED AND SENTINEL LYMPH NODE BIOPSY;  Surgeon: Jovita Kussmaul, MD;  Location: [facility];  Service: General;  Laterality: Left;   BREAST SURGERY     left breast lump--benign   CARDIAC CATHETERIZATION  06/03/2005   COLONOSCOPY  11/28/2001   CORONARY ARTERY BYPASS GRAFT  2007   x2 Dr. Roxan Hockey, [place] to [place], SVG to [place] N/A 09/28/2017   Procedure: [place];  Surgeon: Molli Posey, MD;  Location:  [facility];  Service: Gynecology;  Laterality: N/A;   LIPOMA EXCISION     back   LIPOMA EXCISION Left 01/14/2021   Procedure: EXCISION LEFT CHEST WALL LIPOMA;  Surgeon: Jovita Kussmaul, MD;  Location: [facility];  Service: General;  Laterality: Left;   PORTACATH PLACEMENT Right 01/14/2021   Procedure: INSERTION PORT-A-CATH;  Surgeon: Jovita Kussmaul, MD;  Location: [facility];  Service: General;  Laterality: Right;   UPPER GI ENDOSCOPY  01/20/1989   Family History  Problem Relation Age of Onset   Stroke Mother    Hypertension Mother    Neuropathy Mother    Stroke Father    Hypertension Father    Diabetes Father    Cancer Sister 49       sarcoma   Diabetes Brother    Arthritis Sister    Arthritis Sister    Lupus Sister    Hemachromatosis Sister    Arthritis Sister    Hemachromatosis Sister    Hemachromatosis Sister    Diverticulitis Sister    Diabetes Brother    Diabetes Brother    Hemachromatosis Brother    Lung cancer  Brother    Social History   Socioeconomic History   Marital status: Widowed    Spouse name: Not on file   Number of children: 3   Years of education: 12   Highest education level: High school graduate  Occupational History   Occupation: retired  Tobacco Use   Smoking status: Never   Smokeless tobacco: Never   Tobacco comments:    never used tobacco  Vaping Use   Vaping Use: Never used  Substance and Sexual Activity   Alcohol use: Yes    Alcohol/week: 0.0 standard drinks    Comment: rare wine   Drug use: No   Sexual activity: Not Currently    Birth control/protection: Post-menopausal  Other Topics Concern   Not on file  Social History Narrative   Her 2 daughters live nearby and visit often   Social Determinants of Health   Financial Resource Strain: Low Risk    Difficulty of Paying Living Expenses: Not hard at all  Food Insecurity: No Food Insecurity   Worried About Charity fundraiser in the Last Year: Never true   Arboriculturist in the Last Year: Never true  Transportation Needs: No Transportation Needs   Lack of Transportation (Medical): No   Lack of Transportation (Non-Medical): No  Physical Activity: Insufficiently Active   Days of Exercise per Week: 7 days   Minutes of Exercise per Session: 10 min  Stress: No Stress Concern Present   Feeling of Stress : Only a little  Social Connections: Moderately Integrated   Frequency of Communication with Friends and Family: More than three times a week   Frequency of Social Gatherings with Friends and Family: More than three times a week   Attends Religious Services: 1 to 4 times per year   Active Member of [employer] or Organizations: Yes   Attends Archivist Meetings: 1 to 4 times per year   Marital Status: Widowed    Tobacco Counseling Counseling given: Not Answered Tobacco comments: never used tobacco   Clinical Intake:  Pre-visit preparation completed: Yes  Pain : No/denies pain Pain Score: 0-No  pain     BMI - recorded: 31.53 Nutritional Status: BMI > 30  Obese Nutritional Risks: Nausea/ vomitting/ diarrhea (nausea and loss of appetite due to cancer/ chemo)  Diabetes: Yes CBG done?: No Did pt. bring in CBG monitor from home?: No  How often do you need to have someone help you when you read instructions, pamphlets, or other written materials from your doctor or pharmacy?: 1 - Never  Diabetic? Nutrition Risk Assessment:   Has the patient had any N/V/D within the last 2 months?  Yes  - due to chemo Does the patient have any non-healing wounds?  No  Has the patient had any unintentional weight loss or weight gain?  No   Diabetes:  Is the patient diabetic?  Yes  If diabetic, was a CBG obtained today?  No  Did the patient bring in their glucometer from home?  No  How often do you monitor your CBG's? Once daily fasting, unless she is taking steroids -then she checks 2-3 times per day.   Financial Strains and Diabetes Management:  Are you having any financial strains with the device, your supplies or your medication? No .  Does the patient want to be seen by Chronic Care Management for management of their diabetes?  No  Would the patient like to be referred to a Nutritionist or for Diabetic Management?  No   Diabetic Exams:  Diabetic Eye Exam: Completed 2022.  Diabetic Foot Exam: Completed 05/03/2020. Pt has been advised about the importance in completing this exam. Pt is scheduled for diabetic foot exam on next visit.    Interpreter Needed?: No  Information entered by :: Ravneet Spilker, LPN   Activities of Daily Living In your present state of health, do you have any difficulty performing the following activities: 04/14/2021 01/14/2021  Hearing? Y N  Comment wears hearing aids sometimes -  Vision? N N  Difficulty concentrating or making decisions? N N  Walking or climbing stairs? N Y  Dressing or bathing? N N  Doing errands, shopping? N -  Preparing Food and eating ?  N -  Using the Toilet? N -  In the past six months, have you accidently leaked urine? N -  Do you have problems with loss of bowel control? N -  Managing your Medications? N -  Managing your Finances? N -  Housekeeping or managing your Housekeeping? N -  Some recent data might be hidden    Patient Care Team: Claretta Fraise, MD as PCP - General (Family Medicine) Debara Pickett Nadean Corwin, MD as PCP - Cardiology (Cardiology) Volanda Napoleon, MD as Consulting Physician (Oncology) Molli Posey, MD as Consulting Physician (Obstetrics and Gynecology) Debara Pickett Nadean Corwin, MD as Consulting Physician (Cardiology) Ilean China, RN as Case Manager Shea Evans Norva Riffle, LCSW as [place] Management (Licensed Clinical Social Worker) Cordelia Poche, RN as Oncology Nurse Navigator  Indicate any recent Medical Services you may have received from other than [facility] providers in the past year (date may be approximate).     Assessment:   This is a routine wellness examination for [place].  Hearing/Vision screen Hearing Screening - Comments:: Wears hearing aids when she goes out - from [place]:: Wears rx glasses - up to date with annual eye exams with [facility] in [place]  Dietary issues and exercise activities discussed: Current Exercise Habits: Home exercise routine, Type of exercise: walking, Time (Minutes): 15, Frequency (Times/Week): 7, Weekly Exercise (Minutes/Week): 105, Intensity: Mild, Exercise limited by: None identified   Goals Addressed               This Visit's Progress  Complete ADLs daily, manage daily needs (pt-stated)   On track     Timeframe:  Short-Term Goal Priority:  Medium This Visit's Progress:  On Track Start Date:    12/16/20                        Expected End Date:  03/11/21                  Follow Up Date 01/31/21 at 10:00 AM  Find Help in My Community (Patient);  Patient needs information on community resources to help  her manage medical conditions. Client to complete ADLs daily and manage her daily needs  Why is this important?   Knowing how and where to find help for yourself or family in your neighborhood and community is an important skill.  You will want to take some steps to learn how.   Patient Strengths: Has support from her daughter Takes medications as prescribed Attends scheduled medical appointments  Patient [place]  Needs some help occasionally with ADLs completion Social isolation occasionally   Patient Goals:  Client will use coping strategies in next 30 days to help her manage ongoing medical issues faced Client will complete ADLs daily as needed for next 30 days -  Follow Up Plan: LCSW to call client on 01/31/21 at 10:00 AM to assess client needs    -           Depression Screen PHQ 2/9 Scores 04/14/2021 01/20/2021 12/16/2020 09/03/2020 06/25/2020 04/12/2020 11/13/2019  PHQ - 2 Score 0 0 1 0 0 0 1  PHQ- 9 Score - - 4 2 - - -    Fall Risk Fall Risk  04/14/2021 01/20/2021 06/25/2020 04/12/2020 11/13/2019  Falls in the past year? 0 0 0 0 0  Number falls in past yr: 0 - - - -  Injury with Fall? 0 - - - -  Risk for fall due to : Impaired balance/gait;Medication side effect - - - -  Follow up Education provided;Falls prevention discussed - - - Falls evaluation completed    FALL RISK PREVENTION PERTAINING TO THE HOME:  Any stairs in or around the home? No  If so, are there any without handrails? No  Home free of loose throw rugs in walkways, pet beds, electrical cords, etc? Yes  Adequate lighting in your home to reduce risk of falls? Yes   ASSISTIVE DEVICES UTILIZED TO PREVENT FALLS:  Life alert? No  Use of a cane, walker or w/c? No  Grab bars in the bathroom? Yes  Shower chair or bench in shower? Yes  Elevated toilet seat or a handicapped toilet? Yes   TIMED UP AND GO:  Was the test performed? No . Telephonic visit  Cognitive Function: Normal cognitive status assessed  by direct observation by this Nurse Health Advisor. No abnormalities found.   MMSE - Mini Mental State Exam 11/08/2017 08/08/2014  Orientation to time 5 5  Orientation to Place 5 5  Registration 3 3  Attention/ Calculation 5 5  Recall 3 3  Language- name 2 objects 2 2  Language- repeat 1 1  Language- follow 3 step command 3 3  Language- read & follow direction 1 1  Write a sentence 1 1  Copy design 1 1  Total score 30 30     6CIT Screen 04/12/2020 11/10/2018  What Year? 0 points 0 points  What month? 0 points 0 points  What time? 0 points 0 points  Count back from 20 0 points 0 points  Months in reverse 0 points 0 points  Repeat phrase 0 points 0 points  Total Score 0 0    Immunizations Immunization History  Administered Date(s) Administered   Fluad Quad(high Dose 65+) 01/20/2021   Influenza, High Dose Seasonal PF 12/09/2012, 12/26/2013, 03/18/2018   Influenza,inj,Quad PF,6+ Mos 01/01/2015   Influenza,inj,quad, With Preservative 04/23/2017   Influenza-Unspecified 12/21/2013, 01/21/2016, 03/09/2020   PFIZER(Purple Top)SARS-COV-2 Vaccination 04/16/2019, 05/08/2019, 01/20/2020, 10/05/2020   Zoster, Live 01/24/2015    TDAP status: Due, Education has been provided regarding the importance of this vaccine. Advised may receive this vaccine at local pharmacy or Health Dept. Aware to provide a copy of the vaccination record if obtained from local pharmacy or Health Dept. Verbalized acceptance and understanding.  Flu Vaccine status: Up to date  Pneumococcal vaccine status: Due, Education has been provided regarding the importance of this vaccine. Advised may receive this vaccine at local pharmacy or Health Dept. Aware to provide a copy of the vaccination record if obtained from local pharmacy or Health Dept. Verbalized acceptance and understanding.  Covid-19 vaccine status: Completed vaccines  Qualifies for Shingles Vaccine? Yes   Zostavax completed Yes   Shingrix Completed?: No.     Education has been provided regarding the importance of this vaccine. Patient has been advised to call insurance company to determine out of pocket expense if they have not yet received this vaccine. Advised may also receive vaccine at local pharmacy or Health Dept. Verbalized acceptance and understanding.  Screening Tests Health Maintenance  Topic Date Due   Pneumonia Vaccine 64+ Years old (1 - PCV) Never done   Zoster Vaccines- Shingrix (1 of 2) Never done   OPHTHALMOLOGY EXAM  11/26/2018   DEXA SCAN  11/09/2019   COVID-19 Vaccine (5 - Booster for Pfizer series) 11/30/2020   TETANUS/TDAP  06/25/2021 (Originally 06/06/1959)   FOOT EXAM  05/03/2021   MAMMOGRAM  05/29/2021   HEMOGLOBIN A1C  06/26/2021   INFLUENZA VACCINE  Completed   HPV VACCINES  Aged Out    Health Maintenance  Health Maintenance Due  Topic Date Due   Pneumonia Vaccine 16+ Years old (1 - PCV) Never done   Zoster Vaccines- Shingrix (1 of 2) Never done   OPHTHALMOLOGY EXAM  11/26/2018   DEXA SCAN  11/09/2019   COVID-19 Vaccine (5 - Booster for [place] series) 11/30/2020    Colorectal cancer screening: No longer required.   Mammogram status: Completed 11/29/2020. Repeat every year (every 6 months)  Bone Density status: Completed 11/08/2017. Results reflect: Bone density results: OSTEOPENIA. Repeat every 2 years.  Lung Cancer Screening: (Low Dose CT Chest recommended if Age 5-80 years, 30 pack-year currently smoking OR have quit w/in 15years.) does not qualify.   Additional Screening:  Hepatitis C Screening: does not qualify  Vision Screening: Recommended annual ophthalmology exams for early detection of glaucoma and other disorders of the eye. Is the patient up to date with their annual eye exam?  Yes  Who is the provider or what is the name of the office in which the patient attends annual eye exams? [facility] If pt is not established with a provider, would they like to be referred to a provider  to establish care? No .   Dental Screening: Recommended annual dental exams for proper oral hygiene  Community Resource Referral / Chronic Care Management: CRR required this visit?  No   CCM required this visit?  No  Plan:     I have personally reviewed and noted the following in the patients chart:   Medical and social history Use of alcohol, tobacco or illicit drugs  Current medications and supplements including opioid prescriptions.  Functional ability and status Nutritional status Physical activity Advanced directives List of other physicians Hospitalizations, surgeries, and ER visits in previous 12 months Vitals Screenings to include cognitive, depression, and falls Referrals and appointments  In addition, I have reviewed and discussed with patient certain preventive protocols, quality metrics, and best practice recommendations. A written personalized care plan for preventive services as well as general preventive health recommendations were provided to patient.     Sandrea Hammond, LPN   [id number]   Nurse Notes: None

## 2021-04-15 ENCOUNTER — Encounter: Payer: Self-pay | Admitting: Family

## 2021-04-15 ENCOUNTER — Encounter: Payer: Self-pay | Admitting: Hematology & Oncology

## 2021-04-17 ENCOUNTER — Other Ambulatory Visit: Payer: Self-pay

## 2021-04-17 ENCOUNTER — Inpatient Hospital Stay (HOSPITAL_BASED_OUTPATIENT_CLINIC_OR_DEPARTMENT_OTHER): Payer: Medicare Other | Admitting: Genetic Counselor

## 2021-04-17 ENCOUNTER — Other Ambulatory Visit: Payer: Self-pay | Admitting: Family Medicine

## 2021-04-17 ENCOUNTER — Other Ambulatory Visit: Payer: Medicare Other

## 2021-04-17 DIAGNOSIS — C50512 Malignant neoplasm of lower-outer quadrant of left female breast: Secondary | ICD-10-CM

## 2021-04-17 DIAGNOSIS — I152 Hypertension secondary to endocrine disorders: Secondary | ICD-10-CM

## 2021-04-17 DIAGNOSIS — Z171 Estrogen receptor negative status [ER-]: Secondary | ICD-10-CM | POA: Diagnosis not present

## 2021-04-17 DIAGNOSIS — E1159 Type 2 diabetes mellitus with other circulatory complications: Secondary | ICD-10-CM

## 2021-04-17 DIAGNOSIS — Z951 Presence of aortocoronary bypass graft: Secondary | ICD-10-CM

## 2021-04-18 ENCOUNTER — Encounter: Payer: Self-pay | Admitting: Hematology & Oncology

## 2021-04-18 ENCOUNTER — Encounter: Payer: Self-pay | Admitting: Genetic Counselor

## 2021-04-18 ENCOUNTER — Encounter: Payer: Self-pay | Admitting: Family

## 2021-04-18 NOTE — Progress Notes (Signed)
REFERRING PROVIDER: Burney Gauze, MD [address] [place], [place] [address]  PRIMARY PROVIDER:  Claretta Fraise, MD  PRIMARY REASON FOR VISIT:  Encounter Diagnosis  Name Primary?   Malignant neoplasm of lower-outer quadrant of left breast of female, estrogen receptor negative ([place]) Yes    HISTORY OF PRESENT ILLNESS:   Carmen [name], a 81 y.o. female, was seen for a [facility] cancer genetics consultation at the request of Dr. Marin Olp due to a personal and family history of cancer.  Carmen [name] presents to clinic today to discuss her genetic test results.   In October 2023, at the age of 74, Carmen [name] was diagnosed with invasive ductal carcinoma of the left breast.   CANCER HISTORY:  Oncology History  Stage I breast cancer, left ([facility])  12/16/2020 Initial Diagnosis   Stage I breast cancer, left ([place])   02/07/2021 Cancer Staging   Staging form: Breast, AJCC 8th Edition - Clinical stage from 02/07/2021: Stage IIB (cT2, cN0, cM0, G3, ER-, PR-, HER2-) - Signed by Volanda Napoleon, MD on 02/07/2021 Nuclear grade: G3 Histologic grading system: 3 grade system    02/28/2021 -  Chemotherapy   Patient is on Treatment Plan : BREAST Adjuvant AC q21d      Genetic Testing   [facility] Multi-Cancer Panel was Negative. Of note, there was a variant of uncertain significance identified in the BARD1 gene (c.1868G>A), BRCA2 gene (c.4156G>T), and RAD50 gene (c.2299G>A). Report date is 02/23/2021.  The Multi-Cancer + RNA Panel offered by [facility] includes sequencing and/or deletion/duplication analysis of the following 84 genes:  AIP*, ALK, APC*, ATM*, AXIN2*, BAP1*, BARD1*, BLM*, BMPR1A*, BRCA1*, BRCA2*, BRIP1*, CASR, CDC73*, CDH1*, CDK4, CDKN1B*, CDKN1C*, CDKN2A, CEBPA, CHEK2*, CTNNA1*, DICER1*, DIS3L2*, EGFR, EPCAM, FH*, FLCN*, GATA2*, GPC3, GREM1, HOXB13, HRAS, KIT, MAX*, MEN1*, MET, MITF, MLH1*, MSH2*, MSH3*, MSH6*, MUTYH*, NBN*, NF1*, NF2*, NTHL1*, PALB2*, PDGFRA, PHOX2B, PMS2*, POLD1*,  POLE*, POT1*, PRKAR1A*, PTCH1*, PTEN*, RAD50*, RAD51C*, RAD51D*, RB1*, RECQL4, RET, RUNX1*, SDHA*, SDHAF2*, SDHB*, SDHC*, SDHD*, SMAD4*, SMARCA4*, SMARCB1*, SMARCE1*, STK11*, SUFU*, TERC, TERT, TMEM127*, Tp53*, TSC1*, TSC2*, VHL*, WRN*, and WT1.  RNA analysis is performed for * genes.     Past Medical History:  Diagnosis Date   Arthritis    Asymptomatic PVCs    Bilateral shoulder pain    CAD (coronary artery disease)    Cancer ([facility]) 11/2015   melanomax4  right upper arm   Chronic renal insufficiency, stage 3 (moderate) (H[facility])    Diabetes mellitus without complication ([facility])    Diverticulosis    Endometrial polyp    External hemorrhoids    Goals of care, counseling/discussion 02/07/2021   Gout    Hemochromatosis, hereditary ([facility]) 11/24/2012   History of anemia    History of duodenal ulcer 1990   Hyperlipidemia    Hypertension    Internal hemorrhoids    Jaundice    age 19 or 75   Myocardial infarction ([facility]) 2007   Neuromuscular disorder ([facility])    peripheral neuropathy   PMB (postmenopausal bleeding)    PONV (postoperative nausea and vomiting)    Prolapse of female pelvic organs    uses pessary   Seizures ([facility])    had one at Dr. Antonieta Pert office after getting blood drawn   Stage I breast cancer, left ([facility]) 12/16/2020   Tick bite 08/12/2017   had 3 bites   Type 2 diabetes mellitus with hyperglycemia, without long-term current use of insulin ([facility]) 01/27/2019   Vitamin D deficiency     Past  Surgical History:  Procedure Laterality Date   BREAST LUMPECTOMY WITH RADIOACTIVE SEED AND SENTINEL LYMPH NODE BIOPSY Left 01/14/2021   Procedure: LEFT BREAST LUMPECTOMY WITH RADIOACTIVE SEED AND SENTINEL LYMPH NODE BIOPSY;  Surgeon: Jovita Kussmaul, MD;  Location: [facility];  Service: General;  Laterality: Left;   BREAST SURGERY     left breast lump--benign   CARDIAC CATHETERIZATION  06/03/2005   COLONOSCOPY  11/28/2001   CORONARY ARTERY BYPASS GRAFT  2007   x2 Dr.  Roxan Hockey, [place] to LAD, [place] to [place] N/A 09/28/2017   Procedure: [place];  Surgeon: Molli Posey, MD;  [facility];  Service: Gynecology;  Laterality: N/A;   LIPOMA EXCISION     back   LIPOMA EXCISION Left 01/14/2021   Procedure: EXCISION LEFT CHEST WALL LIPOMA;  Surgeon: Jovita Kussmaul, MD;  Location: [facility];  Service: General;  Laterality: Left;   PORTACATH PLACEMENT Right 01/14/2021   Procedure: INSERTION PORT-A-CATH;  Surgeon: Jovita Kussmaul, MD;  Location: [facility];  Service: General;  Laterality: Right;   UPPER GI ENDOSCOPY  01/20/1989    Social History   Socioeconomic History   Marital status: Widowed    Spouse name: Not on file   Number of children: 3   Years of education: 12   Highest education level: High school graduate  Occupational History   Occupation: retired  Tobacco Use   Smoking status: Never   Smokeless tobacco: Never   Tobacco comments:    never used tobacco  Vaping Use   Vaping Use: Never used  Substance and Sexual Activity   Alcohol use: Yes    Alcohol/week: 0.0 standard drinks    Comment: rare wine   Drug use: No   Sexual activity: Not Currently    Birth control/protection: Post-menopausal  Other Topics Concern   Not on file  Social History Narrative   Her 2 daughters live nearby and visit often   Social Determinants of Health   Financial Resource Strain: Low Risk    Difficulty of Paying Living Expenses: Not hard at all  Food Insecurity: No Food Insecurity   Worried About Charity fundraiser in the Last Year: Never true   Arboriculturist in the Last Year: Never true  Transportation Needs: No Transportation Needs   Lack of Transportation (Medical): No   Lack of Transportation (Non-Medical): No  Physical Activity: Insufficiently Active   Days of Exercise per Week: 7 days   Minutes of  Exercise per Session: 10 min  Stress: No Stress Concern Present   Feeling of Stress : Only a little  Social Connections: Moderately Integrated   Frequency of Communication with Friends and Family: More than three times a week   Frequency of Social Gatherings with Friends and Family: More than three times a week   Attends Religious Services: 1 to 4 times per year   Active Member of [employer] or Organizations: Yes   Attends Archivist Meetings: 1 to 4 times per year   Marital Status: Widowed     FAMILY HISTORY:  We obtained a detailed, 4-generation family history.  Significant diagnoses are listed below: Family History  Problem Relation Age of Onset   Stroke Mother    Hypertension Mother    Neuropathy Mother    Stroke Father    Hypertension Father    Diabetes Father    Cancer  Sister 69       sarcoma   Arthritis Sister    Arthritis Sister    Lupus Sister    Hemachromatosis Sister    Arthritis Sister    Hemachromatosis Sister    Hemachromatosis Sister    Diverticulitis Sister    Diabetes Brother    Diabetes Brother    Diabetes Brother    Hemachromatosis Brother    Lung cancer Brother    Liver cancer Maternal Grandmother 64   Melanoma Daughter 96     Carmen [name]'s daughter has a history of melanoma diagnosed at age 31. She has a sister who was diagnosed with a sarcoma at age 3, she is deceased, and a sister who was diagnosed with bladder cancer at age 92. She has four sisters with no personal history of cancer. She has a brother who was diagnosed with lung cancer at an unknown age, he is deceased, and second brother who was diagnosed with melanoma in his 76s. She has six other brothers who do not have a history of cancer. She has a niece who was diagnosed with pancreatic cancer in her 59s. Additionally, her maternal grandmother was diagnosed with liver cancer at age 54, she is deceased. Carmen [name] is unaware of previous family history of genetic testing for hereditary  cancer risks.   GENETIC COUNSELING ASSESSMENT: Carmen [name] is a 81 y.o. female with a personal and family history of cancer which is somewhat suggestive of a hereditary predisposition to cancer. Her genetic testing results are available. Therefore, we reviewed the results as noted below.  GENETIC TEST RESULTS:  The [facility] Multi-Cancer Panel+RNA found no pathogenic mutations.  The Multi-Cancer + RNA Panel offered by [facility] includes sequencing and/or deletion/duplication analysis of the following 84 genes:  AIP*, ALK, APC*, ATM*, AXIN2*, BAP1*, BARD1*, BLM*, BMPR1A*, BRCA1*, BRCA2*, BRIP1*, CASR, CDC73*, CDH1*, CDK4, CDKN1B*, CDKN1C*, CDKN2A, CEBPA, CHEK2*, CTNNA1*, DICER1*, DIS3L2*, EGFR, EPCAM, FH*, FLCN*, GATA2*, GPC3, GREM1, HOXB13, HRAS, KIT, MAX*, MEN1*, MET, MITF, MLH1*, MSH2*, MSH3*, MSH6*, MUTYH*, NBN*, NF1*, NF2*, NTHL1*, PALB2*, PDGFRA, PHOX2B, PMS2*, POLD1*, POLE*, POT1*, PRKAR1A*, PTCH1*, PTEN*, RAD50*, RAD51C*, RAD51D*, RB1*, RECQL4, RET, RUNX1*, SDHA*, SDHAF2*, SDHB*, SDHC*, SDHD*, SMAD4*, SMARCA4*, SMARCB1*, SMARCE1*, STK11*, SUFU*, TERC, TERT, TMEM127*, Tp53*, TSC1*, TSC2*, VHL*, WRN*, and WT1.  RNA analysis is performed for * genes.    The test report has been scanned into EPIC and is located under the Molecular Pathology section of the Results Review tab.  A portion of the result report is included below for reference. Genetic testing reported out on 02/23/2021.      Genetic testing identified a variant of uncertain significance (VUS) in the BARD1 gene (c.1868G>A), BRCA2 gene (c.4156G>T), and RAD50 gene (c.2299G>A).  At this time, it is unknown if the variants are associated with an increased risk for cancer or if they are benign, but most uncertain variants are reclassified to benign. It should not be used to make medical management decisions. With time, we suspect the laboratory will determine the significance of the variants, if any. If the laboratory reclassifies the variants, we  will attempt to contact Carmen [name] to discuss it further.   Even though a pathogenic variant was not identified, possible explanations for the cancer in the family may include: There may be no hereditary risk for cancer in the family. The cancers in Carmen [name] and/or her family may be due to other genetic or environmental factors. There may be a gene mutation in one of these genes that current testing  methods cannot detect, but that chance is small. There could be another gene that has not yet been discovered, or that we have not yet tested, that is responsible for the cancer diagnoses in the family.  It is also possible there is a hereditary cause for the cancer in the family that Carmen [name] did not inherit. The variant of uncertain significance detected in the BARD1 or BRCA2 gene may be reclassified as a pathogenic variant in the future. At this time, we do not know if the variants increases the risk for cancer.  Therefore, it is important to remain in touch with cancer genetics in the future so that we can continue to offer Carmen [name] the most up to date genetic testing.   ADDITIONAL GENETIC TESTING:  We discussed with Carmen [name] that her genetic testing was fairly extensive.  If there are genes identified to increase cancer risk that can be analyzed in the future, we would be happy to discuss and coordinate this testing at that time.    CANCER SCREENING RECOMMENDATIONS:  Carmen [name] test result is considered negative (normal).  This means that we have not identified a hereditary cause for her personal and family history of cancer at this time. Most cancers happen by chance and this negative test suggests that her cancer may fall into this category.    An individual's cancer risk and medical management are not determined by genetic test results alone. Overall cancer risk assessment incorporates additional factors, including personal medical history, family history, and any available  genetic information that may result in a personalized plan for cancer prevention and surveillance. Therefore, it is recommended she continue to follow the cancer management and screening guidelines provided by her oncology and primary healthcare provider.  RECOMMENDATIONS FOR FAMILY MEMBERS:   Since she did not inherit a mutation in a cancer predisposition gene included on this panel, her children could not have inherited a mutation from her in one of these genes. We do not recommend familial testing for the variants of uncertain significance (VUS).  Ms. Sabel questions were answered to her satisfaction today. Our contact information was provided should additional questions or concerns arise. Thank you for the referral and allowing [name] to share in the care of your patient.   The patient was seen for a total of 30 minutes in face-to-face genetic counseling.  The patient brought her daughter, Carmen [name].  Drs. Lindi Adie and/or Burr Medico were available to discuss this case as needed.   _______________________________________________________________________ For Office Staff:  Number of people involved in session: 2 Was an Intern/ student involved with case: no

## 2021-04-20 ENCOUNTER — Other Ambulatory Visit: Payer: Self-pay | Admitting: Internal Medicine

## 2021-04-25 ENCOUNTER — Telehealth: Payer: Self-pay | Admitting: Family Medicine

## 2021-04-25 NOTE — Telephone Encounter (Signed)
Pt called to let PCP and Nurse know that she switched pharmacies. Says she went back to [facility] and to please send all of her Rx's there.  [facility].

## 2021-04-25 NOTE — Telephone Encounter (Signed)
Took [facility] out of pt's list & added [facility] back to it. In looking over list of meds prescribed by Dr. Livia Snellen looks like they have all been sent to [facility]

## 2021-04-30 ENCOUNTER — Inpatient Hospital Stay: Payer: Medicare Other | Attending: Hematology & Oncology

## 2021-04-30 ENCOUNTER — Inpatient Hospital Stay: Payer: Medicare Other

## 2021-04-30 ENCOUNTER — Other Ambulatory Visit: Payer: Self-pay

## 2021-04-30 ENCOUNTER — Inpatient Hospital Stay (HOSPITAL_BASED_OUTPATIENT_CLINIC_OR_DEPARTMENT_OTHER): Payer: Medicare Other | Admitting: Family

## 2021-04-30 ENCOUNTER — Encounter: Payer: Self-pay | Admitting: *Deleted

## 2021-04-30 ENCOUNTER — Encounter: Payer: Self-pay | Admitting: Family

## 2021-04-30 VITALS — BP 143/43 | HR 77 | Resp 20

## 2021-04-30 DIAGNOSIS — Z5189 Encounter for other specified aftercare: Secondary | ICD-10-CM | POA: Insufficient documentation

## 2021-04-30 DIAGNOSIS — Z171 Estrogen receptor negative status [ER-]: Secondary | ICD-10-CM | POA: Insufficient documentation

## 2021-04-30 DIAGNOSIS — C50912 Malignant neoplasm of unspecified site of left female breast: Secondary | ICD-10-CM

## 2021-04-30 DIAGNOSIS — Z5111 Encounter for antineoplastic chemotherapy: Secondary | ICD-10-CM | POA: Diagnosis not present

## 2021-04-30 DIAGNOSIS — C50512 Malignant neoplasm of lower-outer quadrant of left female breast: Secondary | ICD-10-CM | POA: Diagnosis not present

## 2021-04-30 LAB — CMP (CANCER CENTER ONLY)
ALT: 17 U/L (ref 0–44)
AST: 18 U/L (ref 15–41)
Albumin: 3.8 g/dL (ref 3.5–5.0)
Alkaline Phosphatase: 82 U/L (ref 38–126)
Anion gap: 9 (ref 5–15)
BUN: 18 mg/dL (ref 8–23)
CO2: 29 mmol/L (ref 22–32)
Calcium: 9.4 mg/dL (ref 8.9–10.3)
Chloride: 102 mmol/L (ref 98–111)
Creatinine: 1.02 mg/dL — ABNORMAL HIGH (ref 0.44–1.00)
GFR, Estimated: 56 mL/min — ABNORMAL LOW (ref >60)
Glucose, Bld: 147 mg/dL — ABNORMAL HIGH (ref 70–99)
Potassium: 4.1 mmol/L (ref 3.5–5.1)
Sodium: 140 mmol/L (ref 135–145)
Total Bilirubin: 0.3 mg/dL (ref 0.3–1.2)
Total Protein: 6.1 g/dL — ABNORMAL LOW (ref 6.5–8.1)

## 2021-04-30 LAB — CBC WITH DIFFERENTIAL (CANCER CENTER ONLY)
Abs Immature Granulocytes: 0.41 10*3/uL — ABNORMAL HIGH (ref 0.00–0.07)
Basophils Absolute: 0.1 10*3/uL (ref 0.0–0.1)
Basophils Relative: 1 %
Eosinophils Absolute: 0.1 10*3/uL (ref 0.0–0.5)
Eosinophils Relative: 1 %
HCT: 36.5 % (ref 36.0–46.0)
Hemoglobin: 12.3 g/dL (ref 12.0–15.0)
Immature Granulocytes: 5 %
Lymphocytes Relative: 15 %
Lymphs Abs: 1.2 10*3/uL (ref 0.7–4.0)
MCH: 30.8 pg (ref 26.0–34.0)
MCHC: 33.7 g/dL (ref 30.0–36.0)
MCV: 91.5 fL (ref 80.0–100.0)
Monocytes Absolute: 0.9 10*3/uL (ref 0.1–1.0)
Monocytes Relative: 11 %
Neutro Abs: 5.1 10*3/uL (ref 1.7–7.7)
Neutrophils Relative %: 67 %
Platelet Count: 249 10*3/uL (ref 150–400)
RBC: 3.99 MIL/uL (ref 3.87–5.11)
RDW: 14.3 % (ref 11.5–15.5)
WBC Count: 7.7 10*3/uL (ref 4.0–10.5)
nRBC: 0 % (ref 0.0–0.2)

## 2021-04-30 MED ORDER — SODIUM CHLORIDE 0.9 % IV SOLN
150.0000 mg | Freq: Once | INTRAVENOUS | Status: AC
  Administered 2021-04-30: 150 mg via INTRAVENOUS
  Filled 2021-04-30: qty 150

## 2021-04-30 MED ORDER — DOXORUBICIN HCL CHEMO IV INJECTION 2 MG/ML
54.0000 mg/m2 | Freq: Once | INTRAVENOUS | Status: AC
  Administered 2021-04-30: 102 mg via INTRAVENOUS
  Filled 2021-04-30: qty 51

## 2021-04-30 MED ORDER — SODIUM CHLORIDE 0.9 % IV SOLN
600.0000 mg/m2 | Freq: Once | INTRAVENOUS | Status: AC
  Administered 2021-04-30: 1140 mg via INTRAVENOUS
  Filled 2021-04-30: qty 57

## 2021-04-30 MED ORDER — HEPARIN SOD (PORK) LOCK FLUSH 100 UNIT/ML IV SOLN
500.0000 [IU] | Freq: Once | INTRAVENOUS | Status: AC | PRN
  Administered 2021-04-30: 500 [IU]

## 2021-04-30 MED ORDER — SODIUM CHLORIDE 0.9% FLUSH
10.0000 mL | INTRAVENOUS | Status: DC | PRN
  Administered 2021-04-30: 10 mL

## 2021-04-30 MED ORDER — PALONOSETRON HCL INJECTION 0.25 MG/5ML
0.2500 mg | Freq: Once | INTRAVENOUS | Status: AC
  Administered 2021-04-30: 0.25 mg via INTRAVENOUS
  Filled 2021-04-30: qty 5

## 2021-04-30 MED ORDER — SODIUM CHLORIDE 0.9 % IV SOLN
10.0000 mg | Freq: Once | INTRAVENOUS | Status: AC
  Administered 2021-04-30: 10 mg via INTRAVENOUS
  Filled 2021-04-30: qty 10

## 2021-04-30 MED ORDER — SODIUM CHLORIDE 0.9 % IV SOLN: Freq: Once | INTRAVENOUS | Status: AC

## 2021-04-30 NOTE — Patient Instructions (Signed)
Carmen [name]  Discharge Instructions: Thank you for choosing [facility] to provide your oncology and hematology care.   If you have a lab appointment with the [facility], please go directly to the [facility] and check in at the registration area.  Wear comfortable clothing and clothing appropriate for easy access to any Portacath or PICC line.   We strive to give you quality time with your provider. You may need to reschedule your appointment if you arrive late (15 or more minutes).  Arriving late affects you and other patients whose appointments are after yours.  Also, if you miss three or more appointments without notifying the office, you may be dismissed from the clinic at the providers discretion.      For prescription refill requests, have your pharmacy contact our office and allow 72 hours for refills to be completed.    Today you received the following chemotherapy and/or immunotherapy agents Adriamycin, Cytoxan       To help prevent nausea and vomiting after your treatment, we encourage you to take your nausea medication as directed.  BELOW ARE SYMPTOMS THAT SHOULD BE REPORTED IMMEDIATELY: *FEVER GREATER THAN 100.4 F (38 C) OR HIGHER *CHILLS OR SWEATING *NAUSEA AND VOMITING THAT IS NOT CONTROLLED WITH YOUR NAUSEA MEDICATION *UNUSUAL SHORTNESS OF BREATH *UNUSUAL BRUISING OR BLEEDING *URINARY PROBLEMS (pain or burning when urinating, or frequent urination) *BOWEL PROBLEMS (unusual diarrhea, constipation, pain near the anus) TENDERNESS IN MOUTH AND THROAT WITH OR WITHOUT PRESENCE OF ULCERS (sore throat, sores in mouth, or a toothache) UNUSUAL RASH, SWELLING OR PAIN  UNUSUAL VAGINAL DISCHARGE OR ITCHING   Items with * indicate a potential emergency and should be followed up as soon as possible or go to the Emergency Department if any problems should occur.  Please show the CHEMOTHERAPY ALERT CARD or IMMUNOTHERAPY ALERT CARD at  check-in to the Emergency Department and triage nurse. Should you have questions after your visit or need to cancel or reschedule your appointment, please contact [facility]  [phone] and follow the prompts.  Office hours are 8:00 a.m. to 4:30 p.m. Monday - Friday. Please note that voicemails left after 4:00 p.m. may not be returned until the following business day.  We are closed weekends and major holidays. You have access to a nurse at all times for urgent questions. Please call the main number to the clinic [phone] and follow the prompts.  For any non-urgent questions, you may also contact your provider using MyChart. We now offer e-Visits for anyone 20 and older to request care online for non-urgent symptoms. For details visit [email or web address].   Also download the MyChart app! Go to the app store, search "MyChart", open the app, select [facility], and log in with your MyChart username and password.  Due to Covid, a mask is required upon entering the hospital/clinic. If you do not have a mask, one will be given to you upon arrival. For doctor visits, patients may have 1 support person aged 24 or older with them. For treatment visits, patients cannot have anyone with them due to current Covid guidelines and our immunocompromised population.

## 2021-04-30 NOTE — Progress Notes (Signed)
Hematology and Oncology Follow Up Visit  Carmen [name] [id number] 08-27-1940 81 y.o. 04/30/2021   Principle Diagnosis:  Invasive ductal carcinoma of the left breast-stage IIA (T2N0M0) -- TRIPLE NEGATIVE Hemochromatosis (double heterozygote for C282Y and S65C mutations).   Current Therapy:  Phlebotomy to maintain ferritin less than 100 Adriamycin/Cytoxan - started adjuvant therapy on 02/27/2021, s/p cycle 2   Interim History:  Carmen [name] is here today for follow-up and treatment. She is doing well but states that  several days after her treatment she has had 3 days of constipation followed by a day of diarrhea. She had been taking Mirilax as well as a stool softener for constipation. She will omit the stool softener this round and just try doing the Mirilax and see if this will help.  She notes fatigue and had dizziness after the spell with diarrhea. Thankfully she had no falls or syncopal episodes.  No fever, chills, vomiting, cough, rash, dizziness, SOB, chest pain, palpitations, abdominal pain or changes in bladder habits.  She had some jaw pain after taking Zofran for nausea. No other symptoms at that time. She quit taking the Zofran and will take Compazine instead if needed.  She has a good appetite besides food tasting bad and is doing her best to stay well hydrated throughout the day. Her weight is stable at 179 lbs.  No adenopathy noted on exam. Her left breast seroma is stable and she follows up with her surgeon later this month.   ECOG Performance Status: 1 - Symptomatic but completely ambulatory  Medications:  Allergies as of 04/30/2021       Reactions   Cymbalta [duloxetine Hcl] Other (See Comments)   Profuse sweating   Farxiga [dapagliflozin] Other (See Comments)   Dizziness   Januvia [sitagliptin] Swelling   Meloxicam Nausea And Vomiting   Shellfish Allergy Other (See Comments)   Gout   Trulicity [dulaglutide] Nausea Only   Crestor [rosuvastatin] Other (See  Comments)   Muscle aches on 5 mg daily and 10 mg twice weekly   Diclofenac Diarrhea   Lipitor [atorvastatin] Other (See Comments)   Muscle aches   Livalo [pitavastatin] Other (See Comments)      Sulfa Antibiotics Rash   Tape Other (See Comments)   Causes skin irritation   Zetia [ezetimibe] Other (See Comments)   Muscle aches   Zocor [simvastatin] Other (See Comments)   Muscle aches         Medication List        Accurate as of April 30, 2021 12:58 PM. If you have any questions, ask your nurse or doctor.          Accu-Chek [employer] Kit Use to check Blood Sugars   allopurinol 100 MG tablet Commonly known as: ZYLOPRIM TAKE  (1)  TABLET TWICE A DAY.   aspirin 81 MG tablet Take 81 mg by mouth daily.   colchicine 0.6 MG tablet TAKE (1) TABLET DAILY AS NEEDED.   dexamethasone 4 MG tablet Commonly known as: DECADRON Take 2 tablets (8 mg total) by mouth daily. Take daily for 3 days after chemo. Take with food.   fluticasone 50 MCG/ACT nasal spray Commonly known as: FLONASE SPRAY 1 SPRAY IN EACH NOSTRIL ONCE DAILY.   furosemide 20 MG tablet Commonly known as: LASIX TAKE 1 TABLET BY MOUTH EVERY DAY   glipiZIDE 10 MG tablet Commonly known as: GLUCOTROL Take 2 tablets (20 mg total) by mouth daily before breakfast AND 1 tablet (10 mg total) daily  before supper.   glucose blood test strip Commonly known as: ONE TOUCH ULTRA TEST CHECK BLOOD SUGAR 2 TIMES A DAY   HYDROcodone-acetaminophen 5-325 MG tablet Commonly known as: NORCO/VICODIN Take 1 tablet by mouth every 6 (six) hours as needed for moderate pain or severe pain.   icosapent Ethyl 1 g capsule Commonly known as: VASCEPA TAKE (2) CAPSULES TWICE DAILY.   insulin lispro 100 UNIT/ML KwikPen Commonly known as: HumaLOG KwikPen Max Daily 25 units   Insulin Pen Needle 32G X 4 MM Misc 1 Device by Does not apply route 3 (three) times daily.   lidocaine-prilocaine cream Commonly known as: EMLA Apply  1 application topically as needed.   meclizine 12.5 MG tablet Commonly known as: ANTIVERT Take 1 tablet (12.5 mg total) by mouth 3 (three) times daily as needed for dizziness.   metoprolol tartrate 25 MG tablet Commonly known as: LOPRESSOR TAKE  (1)  TABLET TWICE A DAY.   mupirocin cream 2 % Commonly known as: Bactroban Apply 1 application topically 3 (three) times daily.   olmesartan 20 MG tablet Commonly known as: BENICAR Take 1 tablet (20 mg total) by mouth daily.   ondansetron 8 MG tablet Commonly known as: Zofran Take 1 tablet (8 mg total) by mouth 2 (two) times daily as needed. Start on the third day after chemotherapy.   Praluent 150 MG/ML Soaj Generic drug: Alirocumab Inject 1 Dose into the skin every 14 (fourteen) days.   pregabalin 50 MG capsule Commonly known as: LYRICA TAKE ONE CAPSULE BY MOUTH TWICE DAILY   prochlorperazine 10 MG tablet Commonly known as: COMPAZINE Take 1 tablet (10 mg total) by mouth every 6 (six) hours as needed (Nausea or vomiting).   Restasis 0.05 % ophthalmic emulsion Generic drug: cycloSPORINE Place 2 drops into both eyes 2 (two) times daily as needed.   Rybelsus 7 MG Tabs Generic drug: Semaglutide Take 1 tablet by mouth daily before breakfast.   Vitamin D3 250 MCG (10000 UT) Tabs Take by mouth 3 (three) times a week.   Voltaren 1 % Gel Generic drug: diclofenac Sodium APPLY 4 GRAMS TO AFFECTED AREA(S) 4 TIMES A DAY AS NEEDED   diclofenac Sodium 1 % Gel Commonly known as: VOLTAREN APPLY 4 GRAMS TO AFFECTED AREA(S) 4 TIMES A DAY AS NEEDED        Allergies:  Allergies  Allergen Reactions   Cymbalta [Duloxetine Hcl] Other (See Comments)    Profuse sweating   Farxiga [Dapagliflozin] Other (See Comments)    Dizziness   Januvia [Sitagliptin] Swelling   Meloxicam Nausea And Vomiting   Shellfish Allergy Other (See Comments)    Gout   Trulicity [Dulaglutide] Nausea Only   Crestor [Rosuvastatin] Other (See Comments)     Muscle aches on 5 mg daily and 10 mg twice weekly   Diclofenac Diarrhea   Lipitor [Atorvastatin] Other (See Comments)    Muscle aches   Livalo [Pitavastatin] Other (See Comments)        Sulfa Antibiotics Rash   Tape Other (See Comments)    Causes skin irritation   Zetia [Ezetimibe] Other (See Comments)    Muscle aches   Zocor [Simvastatin] Other (See Comments)    Muscle aches     Past Medical History, Surgical history, Social history, and Family History were reviewed and updated.  Review of Systems: All other 10 point review of systems is negative.   Physical Exam:  vitals were not taken for this visit.   Wt Readings from Last 3  Encounters:  04/14/21 178 lb (80.7 kg)  04/10/21 178 lb (80.7 kg)  03/31/21 176 lb (79.8 kg)    Ocular: Sclerae unicteric, pupils equal, round and reactive to light Ear-nose-throat: Oropharynx clear, dentition fair Lymphatic: No cervical, supraclavicular or axillary adenopathy Lungs no rales or rhonchi, good excursion bilaterally Heart regular rate and rhythm, no murmur appreciated Abd soft, nontender, positive bowel sounds MSK no focal spinal tenderness, no joint edema Neuro: non-focal, well-oriented, appropriate affect Breasts: left breast seroma stable, dry and intact. No lesion or rash noted  Lab Results  Component Value Date   WBC 7.7 04/30/2021   HGB 12.3 04/30/2021   HCT 36.5 04/30/2021   MCV 91.5 04/30/2021   PLT 249 04/30/2021   Lab Results  Component Value Date   FERRITIN 59 02/07/2021   IRON 72 02/07/2021   TIBC 288 02/07/2021   UIBC 215 02/07/2021   IRONPCTSAT 25 02/07/2021   Lab Results  Component Value Date   RBC 3.99 04/30/2021   No results found for: KPAFRELGTCHN, LAMBDASER, KAPLAMBRATIO No results found for: Kandis Cocking, IGMSERUM No results found for: Odetta Pink, SPEI   Chemistry      Component Value Date/Time   NA 139 04/10/2021 1138   NA 147 (H)  01/20/2021 1446   NA 147 (H) 01/22/2017 1451   NA 140 12/31/2015 1052   K 3.7 04/10/2021 1138   K 4.9 (H) 01/22/2017 1451   K 4.2 12/31/2015 1052   CL 101 04/10/2021 1138   CL 106 01/22/2017 1451   CO2 30 04/10/2021 1138   CO2 29 01/22/2017 1451   CO2 23 12/31/2015 1052   BUN 26 (H) 04/10/2021 1138   BUN 18 01/20/2021 1446   BUN 23 (H) 01/22/2017 1451   BUN 21.4 12/31/2015 1052   CREATININE 1.27 (H) 04/10/2021 1138   CREATININE 1.6 (H) 01/22/2017 1451   CREATININE 1.4 (H) 12/31/2015 1052      Component Value Date/Time   CALCIUM 10.2 04/10/2021 1138   CALCIUM 10.3 01/22/2017 1451   CALCIUM 9.7 12/31/2015 1052   ALKPHOS 66 04/10/2021 1138   ALKPHOS 65 01/22/2017 1451   ALKPHOS 87 12/31/2015 1052   AST 19 04/10/2021 1138   AST 31 12/31/2015 1052   ALT 14 04/10/2021 1138   ALT 25 01/22/2017 1451   ALT 36 12/31/2015 1052   BILITOT 0.4 04/10/2021 1138   BILITOT 0.42 12/31/2015 1052       Impression and Plan: Carmen [name] is a very pleasant 81 yo caucasian female with stage IIA infiltrating ductal carcinoma of the left breast, triple negative.  We will proceed with treatment cycle 3 today as planned per Dr. Marin Olp.  Iron studies pending.  Follow-up in 3 weeks.   Lottie Dawson, NP 2/8/202312:58 PM

## 2021-04-30 NOTE — Patient Instructions (Signed)

## 2021-05-01 ENCOUNTER — Encounter: Payer: Self-pay | Admitting: Hematology & Oncology

## 2021-05-01 ENCOUNTER — Inpatient Hospital Stay: Payer: Medicare Other

## 2021-05-01 ENCOUNTER — Encounter: Payer: Self-pay | Admitting: Family

## 2021-05-01 VITALS — BP 139/74 | HR 75 | Temp 97.7°F | Resp 18

## 2021-05-01 DIAGNOSIS — C50912 Malignant neoplasm of unspecified site of left female breast: Secondary | ICD-10-CM

## 2021-05-01 DIAGNOSIS — Z171 Estrogen receptor negative status [ER-]: Secondary | ICD-10-CM | POA: Diagnosis not present

## 2021-05-01 DIAGNOSIS — Z5189 Encounter for other specified aftercare: Secondary | ICD-10-CM | POA: Diagnosis not present

## 2021-05-01 DIAGNOSIS — Z5111 Encounter for antineoplastic chemotherapy: Secondary | ICD-10-CM | POA: Diagnosis not present

## 2021-05-01 LAB — IRON AND IRON BINDING CAPACITY (CC-WL,HP ONLY)
Iron: 118 ug/dL (ref 28–170)
Saturation Ratios: 43 % — ABNORMAL HIGH (ref 10.4–31.8)
TIBC: 274 ug/dL (ref 250–450)
UIBC: 156 ug/dL (ref 148–442)

## 2021-05-01 LAB — FERRITIN: Ferritin: 162 ng/mL (ref 11–307)

## 2021-05-01 MED ORDER — PEGFILGRASTIM-BMEZ 6 MG/0.6ML ~~LOC~~ SOSY
6.0000 mg | PREFILLED_SYRINGE | Freq: Once | SUBCUTANEOUS | Status: AC
  Administered 2021-05-01: 6 mg via SUBCUTANEOUS
  Filled 2021-05-01: qty 0.6

## 2021-05-01 NOTE — Patient Instructions (Signed)

## 2021-05-01 NOTE — Progress Notes (Signed)
Oncology Nurse Navigator Documentation  Oncology Nurse Navigator Flowsheets 04/30/2021  Abnormal Finding Date -  Confirmed Diagnosis Date -  Diagnosis Status -  Planned Course of Treatment -  Phase of Treatment -  Chemotherapy Actual Start Date: -  Expected Surgery Date -  Surgery Actual Start Date: -  Navigator Follow Up Date: 05/21/2021  Navigator Follow Up Reason: Follow-up Appointment;Chemotherapy  Navigator Location [facility]  Referral Date to RadOnc/MedOnc -  Navigator Encounter Type Treatment;Appt/Treatment Plan Review  Telephone -  Treatment Initiated Date -  Patient Visit Type MedOnc  Treatment Phase Active Tx  Barriers/Navigation Needs Coordination of Care;Education  Education -  Interventions Psycho-Social Support  Acuity Level 2-Minimal Needs (1-2 Barriers Identified)  Referrals -  Coordination of Care -  Education Method -  Support Groups/Services Friends and Family  Specialty Items/DME -  Time Spent with Patient 15

## 2021-05-13 ENCOUNTER — Other Ambulatory Visit: Payer: Self-pay | Admitting: Family Medicine

## 2021-05-13 ENCOUNTER — Other Ambulatory Visit: Payer: Self-pay | Admitting: *Deleted

## 2021-05-13 ENCOUNTER — Other Ambulatory Visit: Payer: Self-pay

## 2021-05-13 ENCOUNTER — Encounter: Payer: Self-pay | Admitting: *Deleted

## 2021-05-13 DIAGNOSIS — K1230 Oral mucositis (ulcerative), unspecified: Secondary | ICD-10-CM

## 2021-05-13 DIAGNOSIS — I152 Hypertension secondary to endocrine disorders: Secondary | ICD-10-CM

## 2021-05-13 DIAGNOSIS — Z951 Presence of aortocoronary bypass graft: Secondary | ICD-10-CM

## 2021-05-13 DIAGNOSIS — E1159 Type 2 diabetes mellitus with other circulatory complications: Secondary | ICD-10-CM

## 2021-05-13 MED ORDER — BIOTENE DRY MOUTH MT LIQD: 15.0000 mL | OROMUCOSAL | Status: DC | PRN

## 2021-05-13 MED ORDER — MAGIC MOUTHWASH W/LIDOCAINE: 5.0000 mL | Freq: Three times a day (TID) | ORAL | 6 refills | Status: DC | PRN

## 2021-05-13 NOTE — Progress Notes (Signed)
Patient calling with c/o "fever blisters to my mouth and lips". She has multiple sores inside her mouth and some externally on her lips. They are painful causing reduced oral intake. She also states that she has altered taste. Her daughters encouraged liquid intake this weekend so that she wouldn't get dehydrated.   We will send in prescriptions for MMW and Biotene for her to use prn. Also encouraged her to keep up with liquid intake so that she doesn't become dehydrated.   She is aware of new prescriptions and pharmacy confirmed.   Oncology Nurse Navigator Documentation  Oncology Nurse Navigator Flowsheets 05/13/2021  Abnormal Finding Date -  Confirmed Diagnosis Date -  Diagnosis Status -  Planned Course of Treatment -  Phase of Treatment -  Chemotherapy Actual Start Date: -  Expected Surgery Date -  Surgery Actual Start Date: -  Navigator Follow Up Date: 05/21/2021  Navigator Follow Up Reason: Follow-up Appointment;Chemotherapy  Navigator Location [facility]  Referral Date to RadOnc/MedOnc -  Navigator Encounter Type Telephone  Telephone Symptom Mgt;Incoming Call  Treatment Initiated Date -  Patient Visit Type MedOnc  Treatment Phase Active Tx  Barriers/Navigation Needs Coordination of Care;Education  Education Pain/ Symptom Management  Interventions Education;Medication Assistance;Psycho-Social Support  Acuity Level 2-Minimal Needs (1-2 Barriers Identified)  Referrals -  Coordination of Care -  Education Method Verbal  Support Groups/Services Friends and Family  Specialty Items/DME -  Time Spent with Patient 20

## 2021-05-15 ENCOUNTER — Encounter (HOSPITAL_COMMUNITY): Payer: Self-pay

## 2021-05-21 ENCOUNTER — Encounter: Payer: Self-pay | Admitting: *Deleted

## 2021-05-21 ENCOUNTER — Inpatient Hospital Stay: Payer: Medicare Other

## 2021-05-21 ENCOUNTER — Other Ambulatory Visit: Payer: Self-pay

## 2021-05-21 ENCOUNTER — Inpatient Hospital Stay: Payer: Medicare Other | Attending: Hematology & Oncology

## 2021-05-21 ENCOUNTER — Inpatient Hospital Stay (HOSPITAL_BASED_OUTPATIENT_CLINIC_OR_DEPARTMENT_OTHER): Payer: Medicare Other | Admitting: Hematology & Oncology

## 2021-05-21 ENCOUNTER — Encounter: Payer: Self-pay | Admitting: Hematology & Oncology

## 2021-05-21 VITALS — BP 138/61 | HR 88 | Temp 98.6°F | Resp 17 | Wt 174.1 lb

## 2021-05-21 DIAGNOSIS — C50912 Malignant neoplasm of unspecified site of left female breast: Secondary | ICD-10-CM | POA: Insufficient documentation

## 2021-05-21 DIAGNOSIS — Z5189 Encounter for other specified aftercare: Secondary | ICD-10-CM | POA: Insufficient documentation

## 2021-05-21 DIAGNOSIS — K1230 Oral mucositis (ulcerative), unspecified: Secondary | ICD-10-CM | POA: Insufficient documentation

## 2021-05-21 DIAGNOSIS — C50512 Malignant neoplasm of lower-outer quadrant of left female breast: Secondary | ICD-10-CM

## 2021-05-21 DIAGNOSIS — Z5111 Encounter for antineoplastic chemotherapy: Secondary | ICD-10-CM | POA: Insufficient documentation

## 2021-05-21 DIAGNOSIS — Z171 Estrogen receptor negative status [ER-]: Secondary | ICD-10-CM | POA: Diagnosis not present

## 2021-05-21 LAB — CBC WITH DIFFERENTIAL (CANCER CENTER ONLY)
Abs Immature Granulocytes: 0.25 10*3/uL — ABNORMAL HIGH (ref 0.00–0.07)
Basophils Absolute: 0.1 10*3/uL (ref 0.0–0.1)
Basophils Relative: 1 %
Eosinophils Absolute: 0 10*3/uL (ref 0.0–0.5)
Eosinophils Relative: 0 %
HCT: 33.1 % — ABNORMAL LOW (ref 36.0–46.0)
Hemoglobin: 11.4 g/dL — ABNORMAL LOW (ref 12.0–15.0)
Immature Granulocytes: 3 %
Lymphocytes Relative: 11 %
Lymphs Abs: 0.8 10*3/uL (ref 0.7–4.0)
MCH: 31.2 pg (ref 26.0–34.0)
MCHC: 34.4 g/dL (ref 30.0–36.0)
MCV: 90.7 fL (ref 80.0–100.0)
Monocytes Absolute: 0.7 10*3/uL (ref 0.1–1.0)
Monocytes Relative: 10 %
Neutro Abs: 5.5 10*3/uL (ref 1.7–7.7)
Neutrophils Relative %: 75 %
Platelet Count: 207 10*3/uL (ref 150–400)
RBC: 3.65 MIL/uL — ABNORMAL LOW (ref 3.87–5.11)
RDW: 16.1 % — ABNORMAL HIGH (ref 11.5–15.5)
WBC Count: 7.4 10*3/uL (ref 4.0–10.5)
nRBC: 0 % (ref 0.0–0.2)

## 2021-05-21 LAB — CMP (CANCER CENTER ONLY)
ALT: 13 U/L (ref 0–44)
AST: 16 U/L (ref 15–41)
Albumin: 3.8 g/dL (ref 3.5–5.0)
Alkaline Phosphatase: 74 U/L (ref 38–126)
Anion gap: 10 (ref 5–15)
BUN: 16 mg/dL (ref 8–23)
CO2: 27 mmol/L (ref 22–32)
Calcium: 9.6 mg/dL (ref 8.9–10.3)
Chloride: 104 mmol/L (ref 98–111)
Creatinine: 0.96 mg/dL (ref 0.44–1.00)
GFR, Estimated: 60 mL/min — ABNORMAL LOW (ref >60)
Glucose, Bld: 145 mg/dL — ABNORMAL HIGH (ref 70–99)
Potassium: 3.9 mmol/L (ref 3.5–5.1)
Sodium: 141 mmol/L (ref 135–145)
Total Bilirubin: 0.4 mg/dL (ref 0.3–1.2)
Total Protein: 6.3 g/dL — ABNORMAL LOW (ref 6.5–8.1)

## 2021-05-21 MED ORDER — DOXORUBICIN HCL CHEMO IV INJECTION 2 MG/ML
54.0000 mg/m2 | Freq: Once | INTRAVENOUS | Status: AC
  Administered 2021-05-21: 102 mg via INTRAVENOUS
  Filled 2021-05-21: qty 51

## 2021-05-21 MED ORDER — SODIUM CHLORIDE 0.9 % IV SOLN
600.0000 mg/m2 | Freq: Once | INTRAVENOUS | Status: AC
  Administered 2021-05-21: 1140 mg via INTRAVENOUS
  Filled 2021-05-21: qty 57

## 2021-05-21 MED ORDER — SODIUM CHLORIDE 0.9 % IV SOLN: Freq: Once | INTRAVENOUS | Status: AC

## 2021-05-21 MED ORDER — SODIUM CHLORIDE 0.9% FLUSH
10.0000 mL | INTRAVENOUS | Status: DC | PRN
  Administered 2021-05-21: 10 mL

## 2021-05-21 MED ORDER — SODIUM CHLORIDE 0.9 % IV SOLN
150.0000 mg | Freq: Once | INTRAVENOUS | Status: AC
  Administered 2021-05-21: 150 mg via INTRAVENOUS
  Filled 2021-05-21: qty 150

## 2021-05-21 MED ORDER — SODIUM CHLORIDE 0.9 % IV SOLN
10.0000 mg | Freq: Once | INTRAVENOUS | Status: AC
  Administered 2021-05-21: 10 mg via INTRAVENOUS
  Filled 2021-05-21: qty 10

## 2021-05-21 MED ORDER — HEPARIN SOD (PORK) LOCK FLUSH 100 UNIT/ML IV SOLN
500.0000 [IU] | Freq: Once | INTRAVENOUS | Status: AC | PRN
  Administered 2021-05-21: 500 [IU]

## 2021-05-21 MED ORDER — PALONOSETRON HCL INJECTION 0.25 MG/5ML
0.2500 mg | Freq: Once | INTRAVENOUS | Status: AC
  Administered 2021-05-21: 0.25 mg via INTRAVENOUS
  Filled 2021-05-21: qty 5

## 2021-05-21 NOTE — Progress Notes (Signed)
Patient feels like she's getting weaker after each cycle. Her mucositis was successfully treated but she thinks the decreased intake definitely played a part on the more pronounced fatigue. She still had drug at home with refills in case this symptom recurs.  ? ?Oncology Nurse Navigator Documentation ? ?Oncology Nurse Navigator Flowsheets 05/21/2021  ?Abnormal Finding Date -  ?Confirmed Diagnosis Date -  ?Diagnosis Status -  ?Planned Course of Treatment -  ?Phase of Treatment -  ?Chemotherapy Actual Start Date: -  ?Expected Surgery Date -  ?Surgery Actual Start Date: -  ?Navigator Follow Up Date: 06/11/2021  ?Navigator Follow Up Reason: Follow-up Appointment;Chemotherapy  ?Navigator Location [facility]  ?Referral Date to RadOnc/MedOnc -  ?Navigator Encounter Type Treatment;Appt/Treatment Plan Review  ?Telephone -  ?Treatment Initiated Date -  ?Patient Visit Type MedOnc  ?Treatment Phase Active Tx  ?Barriers/Navigation Needs Coordination of Care;Education  ?Education -  ?Interventions Psycho-Social Support  ?Acuity Level 2-Minimal Needs (1-2 Barriers Identified)  ?Referrals -  ?Coordination of Care -  ?Education Method -  ?Support Groups/Services Friends and Family  ?Specialty Items/DME -  ?Time Spent with Patient 15  ?  ?

## 2021-05-21 NOTE — Progress Notes (Signed)
Hematology and Oncology Follow Up Visit  Carmen [name] [id number] 03-14-1941 81 y.o. 05/21/2021   Principle Diagnosis:  Invasive ductal carcinoma of the left breast-stage IIA (T2N0M0) -- TRIPLE NEGATIVE Hemochromatosis (double heterozygote for C282Y and S65C mutations).   Current Therapy:  Phlebotomy to maintain ferritin less than 100 Adriamycin/Cytoxan - started adjuvant therapy on 02/27/2021, s/p cycle #3/6   Interim History:  [name] is here today for follow-up and treatment.  Overall, she is doing nicely.  She has had some problems with some mucositis.  She is on Magic mouthwash right now..  She says she gets little constipated after treatment.  She takes some natural laxatives and then she has diarrhea.  She feels good overall.  She really has done well with treatment.  She sees the surgeon for breast cancer.  She had a seroma that was noted in the left breast.  She has had the last time that she went this did not had to be drained.  She has had no fever.  There is been no rashes.  She has had no leg swelling.  As far as her hemochromatosis goes, her iron studies back a month ago showed a ferritin of 162 with an iron saturation of 43%.  Overall , her performance status is ECOG 1.     Medications:  Allergies as of 05/21/2021       Reactions   Cymbalta [duloxetine Hcl] Other (See Comments)   Profuse sweating   Farxiga [dapagliflozin] Other (See Comments)   Dizziness   Januvia [sitagliptin] Swelling   Meloxicam Nausea And Vomiting   Shellfish Allergy Other (See Comments)   Gout   Trulicity [dulaglutide] Nausea Only   Crestor [rosuvastatin] Other (See Comments)   Muscle aches on 5 mg daily and 10 mg twice weekly   Diclofenac Diarrhea   Lipitor [atorvastatin] Other (See Comments)   Muscle aches   Livalo [pitavastatin] Other (See Comments)      Sulfa Antibiotics Rash   Tape Other (See Comments)   Causes skin irritation   Zetia [ezetimibe] Other (See Comments)    Muscle aches   Zocor [simvastatin] Other (See Comments)   Muscle aches         Medication List        Accurate as of May 21, 2021  2:38 PM. If you have any questions, ask your nurse or doctor.          Accu-Chek [employer] Kit Use to check Blood Sugars   allopurinol 100 MG tablet Commonly known as: ZYLOPRIM TAKE  (1)  TABLET TWICE A DAY.   antiseptic oral rinse Liqd 15 mLs by Mouth Rinse route as needed for dry mouth.   aspirin 81 MG tablet Take 81 mg by mouth daily.   colchicine 0.6 MG tablet TAKE (1) TABLET DAILY AS NEEDED.   dexamethasone 4 MG tablet Commonly known as: DECADRON Take 2 tablets (8 mg total) by mouth daily. Take daily for 3 days after chemo. Take with food.   fluticasone 50 MCG/ACT nasal spray Commonly known as: FLONASE SPRAY 1 SPRAY IN EACH NOSTRIL ONCE DAILY.   furosemide 20 MG tablet Commonly known as: LASIX TAKE 1 TABLET BY MOUTH EVERY DAY   glipiZIDE 10 MG tablet Commonly known as: GLUCOTROL Take 2 tablets (20 mg total) by mouth daily before breakfast AND 1 tablet (10 mg total) daily before supper.   glucose blood test strip Commonly known as: ONE TOUCH ULTRA TEST CHECK BLOOD SUGAR 2 TIMES A DAY  HYDROcodone-acetaminophen 5-325 MG tablet Commonly known as: NORCO/VICODIN Take 1 tablet by mouth every 6 (six) hours as needed for moderate pain or severe pain.   icosapent Ethyl 1 g capsule Commonly known as: VASCEPA TAKE (2) CAPSULES TWICE DAILY.   insulin lispro 100 UNIT/ML KwikPen Commonly known as: HumaLOG KwikPen Max Daily 25 units   Insulin Pen Needle 32G X 4 MM Misc 1 Device by Does not apply route 3 (three) times daily.   lidocaine-prilocaine cream Commonly known as: EMLA Apply 1 application topically as needed.   magic mouthwash w/lidocaine Soln Take 5 mLs by mouth 3 (three) times daily as needed for mouth pain. benadryl  525 mg, hydrocortisone 60 mg and nystatin 0.6 mg.   meclizine 12.5 MG tablet Commonly  known as: ANTIVERT Take 1 tablet (12.5 mg total) by mouth 3 (three) times daily as needed for dizziness.   metoprolol tartrate 25 MG tablet Commonly known as: LOPRESSOR TAKE  (1)  TABLET TWICE A DAY.   mupirocin cream 2 % Commonly known as: Bactroban Apply 1 application topically 3 (three) times daily.   olmesartan 20 MG tablet Commonly known as: BENICAR Take 1 tablet (20 mg total) by mouth daily.   ondansetron 8 MG tablet Commonly known as: Zofran Take 1 tablet (8 mg total) by mouth 2 (two) times daily as needed. Start on the third day after chemotherapy.   Praluent 150 MG/ML Soaj Generic drug: Alirocumab Inject 1 Dose into the skin every 14 (fourteen) days.   pregabalin 50 MG capsule Commonly known as: LYRICA TAKE ONE CAPSULE BY MOUTH TWICE DAILY   prochlorperazine 10 MG tablet Commonly known as: COMPAZINE Take 1 tablet (10 mg total) by mouth every 6 (six) hours as needed (Nausea or vomiting).   Restasis 0.05 % ophthalmic emulsion Generic drug: cycloSPORINE Place 2 drops into both eyes 2 (two) times daily as needed.   Rybelsus 7 MG Tabs Generic drug: Semaglutide Take 1 tablet by mouth daily before breakfast.   Vitamin D3 250 MCG (10000 UT) Tabs Take by mouth 3 (three) times a week.   Voltaren 1 % Gel Generic drug: diclofenac Sodium APPLY 4 GRAMS TO AFFECTED AREA(S) 4 TIMES A DAY AS NEEDED   diclofenac Sodium 1 % Gel Commonly known as: VOLTAREN APPLY 4 GRAMS TO AFFECTED AREA(S) 4 TIMES A DAY AS NEEDED        Allergies:  Allergies  Allergen Reactions   Cymbalta [Duloxetine Hcl] Other (See Comments)    Profuse sweating   Farxiga [Dapagliflozin] Other (See Comments)    Dizziness   Januvia [Sitagliptin] Swelling   Meloxicam Nausea And Vomiting   Shellfish Allergy Other (See Comments)    Gout   Trulicity [Dulaglutide] Nausea Only   Crestor [Rosuvastatin] Other (See Comments)    Muscle aches on 5 mg daily and 10 mg twice weekly   Diclofenac Diarrhea    Lipitor [Atorvastatin] Other (See Comments)    Muscle aches   Livalo [Pitavastatin] Other (See Comments)        Sulfa Antibiotics Rash   Tape Other (See Comments)    Causes skin irritation   Zetia [Ezetimibe] Other (See Comments)    Muscle aches   Zocor [Simvastatin] Other (See Comments)    Muscle aches     Past Medical History, Surgical history, Social history, and Family History were reviewed and updated.  Review of Systems: Review of Systems  Constitutional: Negative.   HENT:  Positive for sore throat.   Eyes: Negative.   Respiratory:  Negative.    Cardiovascular: Negative.   Gastrointestinal: Negative.   Genitourinary: Negative.   Musculoskeletal: Negative.   Skin: Negative.   Neurological: Negative.   Endo/Heme/Allergies: Negative.   Psychiatric/Behavioral: Negative.      Physical Exam:  weight is 174 lb 1.9 oz (79 kg). Her oral temperature is 98.6 F (37 C). Her blood pressure is 138/61 and her pulse is 88. Her respiration is 17 and oxygen saturation is 98%.   Wt Readings from Last 3 Encounters:  05/21/21 174 lb 1.9 oz (79 kg)  04/30/21 179 lb (81.2 kg)  04/14/21 178 lb (80.7 kg)    Physical Exam Vitals reviewed.  HENT:     Head: Normocephalic and atraumatic.  Eyes:     Pupils: Pupils are equal, round, and reactive to light.  Cardiovascular:     Rate and Rhythm: Normal rate and regular rhythm.     Heart sounds: Normal heart sounds.  Pulmonary:     Effort: Pulmonary effort is normal.     Breath sounds: Normal breath sounds.  Abdominal:     General: Bowel sounds are normal.     Palpations: Abdomen is soft.  Musculoskeletal:        General: No tenderness or deformity. Normal range of motion.     Cervical back: Normal range of motion.  Lymphadenopathy:     Cervical: No cervical adenopathy.  Skin:    General: Skin is warm and dry.     Findings: No erythema or rash.  Neurological:     Mental Status: She is alert and oriented to person, place, and time.   Psychiatric:        Behavior: Behavior normal.        Thought Content: Thought content normal.        Judgment: Judgment normal.     Lab Results  Component Value Date   WBC 7.4 05/21/2021   HGB 11.4 (L) 05/21/2021   HCT 33.1 (L) 05/21/2021   MCV 90.7 05/21/2021   PLT 207 05/21/2021   Lab Results  Component Value Date   FERRITIN 162 04/30/2021   IRON 118 04/30/2021   TIBC 274 04/30/2021   UIBC 156 04/30/2021   IRONPCTSAT 43 (H) 04/30/2021   Lab Results  Component Value Date   RBC 3.65 (L) 05/21/2021   No results found for: KPAFRELGTCHN, LAMBDASER, KAPLAMBRATIO No results found for: IGGSERUM, IGA, IGMSERUM No results found for: Odetta Pink, SPEI   Chemistry      Component Value Date/Time   NA 141 05/21/2021 1325   NA 147 (H) 01/20/2021 1446   NA 147 (H) 01/22/2017 1451   NA 140 12/31/2015 1052   K 3.9 05/21/2021 1325   K 4.9 (H) 01/22/2017 1451   K 4.2 12/31/2015 1052   CL 104 05/21/2021 1325   CL 106 01/22/2017 1451   CO2 27 05/21/2021 1325   CO2 29 01/22/2017 1451   CO2 23 12/31/2015 1052   BUN 16 05/21/2021 1325   BUN 18 01/20/2021 1446   BUN 23 (H) 01/22/2017 1451   BUN 21.4 12/31/2015 1052   CREATININE 0.96 05/21/2021 1325   CREATININE 1.6 (H) 01/22/2017 1451   CREATININE 1.4 (H) 12/31/2015 1052      Component Value Date/Time   CALCIUM 9.6 05/21/2021 1325   CALCIUM 10.3 01/22/2017 1451   CALCIUM 9.7 12/31/2015 1052   ALKPHOS 74 05/21/2021 1325   ALKPHOS 65 01/22/2017 1451   ALKPHOS 87 12/31/2015 1052  AST 16 05/21/2021 1325   AST 31 12/31/2015 1052   ALT 13 05/21/2021 1325   ALT 25 01/22/2017 1451   ALT 36 12/31/2015 1052   BILITOT 0.4 05/21/2021 1325   BILITOT 0.42 12/31/2015 1052       Impression and Plan: Ms. Hanssen is a very pleasant 81 yo caucasian female with stage IIA infiltrating ductal carcinoma of the left breast, triple negative.   We have her on adjuvant chemotherapy  right now.  This will be her fourth cycle of Adriamycin/Cytoxan.  I really think she is done nicely to date.  We will get her back in 3 weeks for her fifth cycle.  Again we have 6 cycles planned.  Volanda Napoleon, MD 3/1/20232:38 PM

## 2021-05-21 NOTE — Patient Instructions (Signed)

## 2021-05-21 NOTE — Patient Instructions (Signed)
[facility]  Discharge Instructions: ?Thank you for choosing [facility] to provide your oncology and hematology care.  ? ?If you have a lab appointment with the [facility], please go directly to the [facility] and check in at the registration area. ? ?Wear comfortable clothing and clothing appropriate for easy access to any Portacath or PICC line.  ? ?We strive to give you quality time with your provider. You may need to reschedule your appointment if you arrive late (15 or more minutes).  Arriving late affects you and other patients whose appointments are after yours.  Also, if you miss three or more appointments without notifying the office, you may be dismissed from the clinic at the provider?s discretion.    ?  ?For prescription refill requests, have your pharmacy contact our office and allow 72 hours for refills to be completed.   ? ?Today you received the following chemotherapy and/or immunotherapy agents Adriamycin and Cytoxan    ?  ?To help prevent nausea and vomiting after your treatment, we encourage you to take your nausea medication as directed. ? ?BELOW ARE SYMPTOMS THAT SHOULD BE REPORTED IMMEDIATELY: ?*FEVER GREATER THAN 100.4 F (38 ?C) OR HIGHER ?*CHILLS OR SWEATING ?*NAUSEA AND VOMITING THAT IS NOT CONTROLLED WITH YOUR NAUSEA MEDICATION ?*UNUSUAL SHORTNESS OF BREATH ?*UNUSUAL BRUISING OR BLEEDING ?*URINARY PROBLEMS (pain or burning when urinating, or frequent urination) ?*BOWEL PROBLEMS (unusual diarrhea, constipation, pain near the anus) ?TENDERNESS IN MOUTH AND THROAT WITH OR WITHOUT PRESENCE OF ULCERS (sore throat, sores in mouth, or a toothache) ?UNUSUAL RASH, SWELLING OR PAIN  ?UNUSUAL VAGINAL DISCHARGE OR ITCHING  ? ?Items with * indicate a potential emergency and should be followed up as soon as possible or go to the Emergency Department if any problems should occur. ? ?Please show the CHEMOTHERAPY ALERT CARD or IMMUNOTHERAPY ALERT CARD at  check-in to the Emergency Department and triage nurse. ?Should you have questions after your visit or need to cancel or reschedule your appointment, please contact [facility]  [phone] and follow the prompts.  Office hours are 8:00 a.m. to 4:30 p.m. Monday - Friday. Please note that voicemails left after 4:00 p.m. may not be returned until the following business day.  We are closed weekends and major holidays. You have access to a nurse at all times for urgent questions. Please call the main number to the clinic [phone] and follow the prompts. ? ?For any non-urgent questions, you may also contact your provider using MyChart. We now offer e-Visits for anyone 33 and older to request care online for non-urgent symptoms. For details visit [email or web address]. ?  ?Also download the MyChart app! Go to the app store, search "MyChart", open the app, select [facility], and log in with your MyChart username and password. ? ?Due to Covid, a mask is required upon entering the hospital/clinic. If you do not have a mask, one will be given to you upon arrival. For doctor visits, patients may have 1 support person aged 32 or older with them. For treatment visits, patients cannot have anyone with them due to current Covid guidelines and our immunocompromised population.  ?

## 2021-05-22 ENCOUNTER — Inpatient Hospital Stay: Payer: Medicare Other

## 2021-05-22 LAB — IRON AND IRON BINDING CAPACITY (CC-WL,HP ONLY)
Iron: 61 ug/dL (ref 28–170)
Saturation Ratios: 21 % (ref 10.4–31.8)
TIBC: 291 ug/dL (ref 250–450)
UIBC: 230 ug/dL (ref 148–442)

## 2021-05-22 LAB — FERRITIN: Ferritin: 292 ng/mL (ref 11–307)

## 2021-05-23 ENCOUNTER — Inpatient Hospital Stay: Payer: Medicare Other

## 2021-05-23 ENCOUNTER — Other Ambulatory Visit: Payer: Self-pay

## 2021-05-23 VITALS — BP 128/70 | HR 88 | Temp 98.9°F | Resp 16

## 2021-05-23 DIAGNOSIS — C50912 Malignant neoplasm of unspecified site of left female breast: Secondary | ICD-10-CM

## 2021-05-23 DIAGNOSIS — Z171 Estrogen receptor negative status [ER-]: Secondary | ICD-10-CM | POA: Diagnosis not present

## 2021-05-23 DIAGNOSIS — Z5189 Encounter for other specified aftercare: Secondary | ICD-10-CM | POA: Diagnosis not present

## 2021-05-23 DIAGNOSIS — Z5111 Encounter for antineoplastic chemotherapy: Secondary | ICD-10-CM | POA: Diagnosis not present

## 2021-05-23 DIAGNOSIS — K1230 Oral mucositis (ulcerative), unspecified: Secondary | ICD-10-CM | POA: Diagnosis not present

## 2021-05-23 MED ORDER — PEGFILGRASTIM-BMEZ 6 MG/0.6ML ~~LOC~~ SOSY
6.0000 mg | PREFILLED_SYRINGE | Freq: Once | SUBCUTANEOUS | Status: AC
  Administered 2021-05-23: 6 mg via SUBCUTANEOUS
  Filled 2021-05-23: qty 0.6

## 2021-05-23 NOTE — Patient Instructions (Signed)

## 2021-05-27 ENCOUNTER — Encounter: Payer: Self-pay | Admitting: Family

## 2021-05-27 ENCOUNTER — Encounter: Payer: Self-pay | Admitting: Hematology & Oncology

## 2021-06-03 DIAGNOSIS — B351 Tinea unguium: Secondary | ICD-10-CM | POA: Diagnosis not present

## 2021-06-03 DIAGNOSIS — E1142 Type 2 diabetes mellitus with diabetic polyneuropathy: Secondary | ICD-10-CM | POA: Diagnosis not present

## 2021-06-03 DIAGNOSIS — L84 Corns and callosities: Secondary | ICD-10-CM | POA: Diagnosis not present

## 2021-06-03 DIAGNOSIS — M79676 Pain in unspecified toe(s): Secondary | ICD-10-CM | POA: Diagnosis not present

## 2021-06-08 ENCOUNTER — Other Ambulatory Visit: Payer: Self-pay | Admitting: Family Medicine

## 2021-06-10 ENCOUNTER — Other Ambulatory Visit: Payer: Self-pay | Admitting: Family Medicine

## 2021-06-10 DIAGNOSIS — Z951 Presence of aortocoronary bypass graft: Secondary | ICD-10-CM

## 2021-06-10 DIAGNOSIS — I152 Hypertension secondary to endocrine disorders: Secondary | ICD-10-CM

## 2021-06-10 NOTE — Telephone Encounter (Signed)
Thirty day supply given patient must be seen for any further refills. ?

## 2021-06-11 ENCOUNTER — Other Ambulatory Visit: Payer: Self-pay

## 2021-06-11 ENCOUNTER — Inpatient Hospital Stay (HOSPITAL_BASED_OUTPATIENT_CLINIC_OR_DEPARTMENT_OTHER): Payer: Medicare Other | Admitting: Hematology & Oncology

## 2021-06-11 ENCOUNTER — Inpatient Hospital Stay: Payer: Medicare Other

## 2021-06-11 ENCOUNTER — Encounter: Payer: Self-pay | Admitting: *Deleted

## 2021-06-11 ENCOUNTER — Encounter: Payer: Self-pay | Admitting: Hematology & Oncology

## 2021-06-11 VITALS — BP 164/71 | HR 86 | Temp 98.4°F | Resp 18 | Ht 63.0 in | Wt 179.0 lb

## 2021-06-11 DIAGNOSIS — C50912 Malignant neoplasm of unspecified site of left female breast: Secondary | ICD-10-CM

## 2021-06-11 DIAGNOSIS — Z171 Estrogen receptor negative status [ER-]: Secondary | ICD-10-CM | POA: Diagnosis not present

## 2021-06-11 DIAGNOSIS — Z5111 Encounter for antineoplastic chemotherapy: Secondary | ICD-10-CM | POA: Diagnosis not present

## 2021-06-11 DIAGNOSIS — Z5189 Encounter for other specified aftercare: Secondary | ICD-10-CM | POA: Diagnosis not present

## 2021-06-11 DIAGNOSIS — K1230 Oral mucositis (ulcerative), unspecified: Secondary | ICD-10-CM | POA: Diagnosis not present

## 2021-06-11 LAB — CMP (CANCER CENTER ONLY)
ALT: 11 U/L (ref 0–44)
AST: 14 U/L — ABNORMAL LOW (ref 15–41)
Albumin: 3.9 g/dL (ref 3.5–5.0)
Alkaline Phosphatase: 63 U/L (ref 38–126)
Anion gap: 9 (ref 5–15)
BUN: 11 mg/dL (ref 8–23)
CO2: 27 mmol/L (ref 22–32)
Calcium: 9.7 mg/dL (ref 8.9–10.3)
Chloride: 105 mmol/L (ref 98–111)
Creatinine: 0.95 mg/dL (ref 0.44–1.00)
GFR, Estimated: >60 mL/min (ref >60)
Glucose, Bld: 151 mg/dL — ABNORMAL HIGH (ref 70–99)
Potassium: 4 mmol/L (ref 3.5–5.1)
Sodium: 141 mmol/L (ref 135–145)
Total Bilirubin: 0.4 mg/dL (ref 0.3–1.2)
Total Protein: 6.1 g/dL — ABNORMAL LOW (ref 6.5–8.1)

## 2021-06-11 LAB — CBC WITH DIFFERENTIAL (CANCER CENTER ONLY)
Abs Immature Granulocytes: 0.19 10*3/uL — ABNORMAL HIGH (ref 0.00–0.07)
Basophils Absolute: 0 10*3/uL (ref 0.0–0.1)
Basophils Relative: 1 %
Eosinophils Absolute: 0 10*3/uL (ref 0.0–0.5)
Eosinophils Relative: 0 %
HCT: 32.7 % — ABNORMAL LOW (ref 36.0–46.0)
Hemoglobin: 10.9 g/dL — ABNORMAL LOW (ref 12.0–15.0)
Immature Granulocytes: 3 %
Lymphocytes Relative: 11 %
Lymphs Abs: 0.6 10*3/uL — ABNORMAL LOW (ref 0.7–4.0)
MCH: 31.9 pg (ref 26.0–34.0)
MCHC: 33.3 g/dL (ref 30.0–36.0)
MCV: 95.6 fL (ref 80.0–100.0)
Monocytes Absolute: 0.6 10*3/uL (ref 0.1–1.0)
Monocytes Relative: 11 %
Neutro Abs: 4.2 10*3/uL (ref 1.7–7.7)
Neutrophils Relative %: 74 %
Platelet Count: 170 10*3/uL (ref 150–400)
RBC: 3.42 MIL/uL — ABNORMAL LOW (ref 3.87–5.11)
RDW: 17.3 % — ABNORMAL HIGH (ref 11.5–15.5)
WBC Count: 5.6 10*3/uL (ref 4.0–10.5)
nRBC: 0 % (ref 0.0–0.2)

## 2021-06-11 LAB — RETICULOCYTES
Immature Retic Fract: 24.8 % — ABNORMAL HIGH (ref 2.3–15.9)
RBC.: 3.43 MIL/uL — ABNORMAL LOW (ref 3.87–5.11)
Retic Count, Absolute: 129.3 10*3/uL (ref 19.0–186.0)
Retic Ct Pct: 3.8 % — ABNORMAL HIGH (ref 0.4–3.1)

## 2021-06-11 MED ORDER — OLANZAPINE 10 MG PO TABS: 10.0000 mg | ORAL_TABLET | Freq: Every day | ORAL | 4 refills | Status: DC

## 2021-06-11 MED ORDER — SODIUM CHLORIDE 0.9 % IV SOLN
150.0000 mg | Freq: Once | INTRAVENOUS | Status: AC
  Administered 2021-06-11: 150 mg via INTRAVENOUS
  Filled 2021-06-11: qty 150

## 2021-06-11 MED ORDER — DOXORUBICIN HCL CHEMO IV INJECTION 2 MG/ML
54.0000 mg/m2 | Freq: Once | INTRAVENOUS | Status: AC
  Administered 2021-06-11: 102 mg via INTRAVENOUS
  Filled 2021-06-11: qty 51

## 2021-06-11 MED ORDER — PALONOSETRON HCL INJECTION 0.25 MG/5ML
0.2500 mg | Freq: Once | INTRAVENOUS | Status: AC
  Administered 2021-06-11: 0.25 mg via INTRAVENOUS

## 2021-06-11 MED ORDER — SODIUM CHLORIDE 0.9 % IV SOLN: Freq: Once | INTRAVENOUS | Status: AC

## 2021-06-11 MED ORDER — SODIUM CHLORIDE 0.9% FLUSH
10.0000 mL | INTRAVENOUS | Status: DC | PRN
  Administered 2021-06-11: 10 mL

## 2021-06-11 MED ORDER — SODIUM CHLORIDE 0.9 % IV SOLN
10.0000 mg | Freq: Once | INTRAVENOUS | Status: AC
  Administered 2021-06-11: 10 mg via INTRAVENOUS
  Filled 2021-06-11: qty 10

## 2021-06-11 MED ORDER — SODIUM CHLORIDE 0.9 % IV SOLN
600.0000 mg/m2 | Freq: Once | INTRAVENOUS | Status: AC
  Administered 2021-06-11: 1140 mg via INTRAVENOUS
  Filled 2021-06-11: qty 57

## 2021-06-11 MED ORDER — HEPARIN SOD (PORK) LOCK FLUSH 100 UNIT/ML IV SOLN
500.0000 [IU] | Freq: Once | INTRAVENOUS | Status: AC | PRN
  Administered 2021-06-11: 500 [IU]

## 2021-06-11 NOTE — Progress Notes (Signed)
?Hematology and Oncology Follow Up Visit ? ?JAYELLE PAGE ?[id number] ?1941/02/08 81 y.o. ?06/11/2021 ? ? ?Principle Diagnosis:  ?Invasive ductal carcinoma of the left breast-stage IIA (T2N0M0) -- TRIPLE NEGATIVE ?Hemochromatosis (double heterozygote for C282Y and S65C ?mutations). ?  ?Current Therapy:  ?Phlebotomy to maintain ferritin less than 100 ?Adriamycin/Cytoxan - started adjuvant therapy on 02/27/2021, s/p cycle #4/6 ?  ?Interim History:  Ms. Wrightson is here today for follow-up and treatment.  She continues to do amazing well.  Her main problem has been some nausea.  She has some problems with the Zofran.  It caused headaches.  She had some bony pain.  The bony pain may have been from the Neupogen that she gets.  I will try her on olanzapine (10 mg p.o. nightly) to see if this may help with the nausea. ? ?She has had no problems with diarrhea.  She has not had constipation.  She has tried multiple things for the constipation.  When she starts ago, she says it is then "explosive." ? ?I told her to try some milk of magnesia to see if this may help. ? ?She has a decent appetite.  She has had no vomiting. ? ?There is no visual changes.  She has had no mouth sores.  There is been no leg swelling. ? ?She does have hemochromatosis.  When we last saw her, her ferritin was 292 with an iron saturation of 21%.  We are just holding off on doing any phlebotomies right now while she is getting treated. ? ?Overall, I would say performance status is probably ECOG 1. ? ? ?Medications:  ?Allergies as of 06/11/2021   ? ?   Reactions  ? Cymbalta [duloxetine Hcl] Other (See Comments)  ? Profuse sweating  ? Wilder Glade [dapagliflozin] Other (See Comments)  ? Dizziness  ? Januvia [sitagliptin] Swelling  ? Meloxicam Nausea And Vomiting  ? Shellfish Allergy Other (See Comments)  ? Gout  ? Trulicity [dulaglutide] Nausea Only  ? Crestor [rosuvastatin] Other (See Comments)  ? Muscle aches on 5 mg daily and 10 mg twice weekly  ?  Diclofenac Diarrhea  ? Lipitor [atorvastatin] Other (See Comments)  ? Muscle aches  ? Livalo [pitavastatin] Other (See Comments)  ?   ? Sulfa Antibiotics Rash  ? Tape Other (See Comments)  ? Causes skin irritation  ? Zetia [ezetimibe] Other (See Comments)  ? Muscle aches  ? Zocor [simvastatin] Other (See Comments)  ? Muscle aches   ? ?  ? ?  ?Medication List  ?  ? ?  ? Accurate as of June 11, 2021 12:20 PM. If you have any questions, ask your nurse or doctor.  ?  ?  ? ?  ? ?Accu-Chek [employer] Kit ?Use to check Blood Sugars ?  ?allopurinol 100 MG tablet ?Commonly known as: ZYLOPRIM ?TAKE  (1)  TABLET TWICE A DAY.  (NEEDS TO BE SEEN BEFORE NEXT REFILL) ?  ?antiseptic oral rinse Liqd ?15 mLs by Mouth Rinse route as needed for dry mouth. ?  ?aspirin 81 MG tablet ?Take 81 mg by mouth daily. ?  ?colchicine 0.6 MG tablet ?TAKE (1) TABLET DAILY AS NEEDED. ?  ?dexamethasone 4 MG tablet ?Commonly known as: DECADRON ?Take 2 tablets (8 mg total) by mouth daily. Take daily for 3 days after chemo. Take with food. ?  ?fluticasone 50 MCG/ACT nasal spray ?Commonly known as: FLONASE ?SPRAY 1 SPRAY IN EACH NOSTRIL ONCE DAILY. ?  ?furosemide 20 MG tablet ?Commonly known as: LASIX ?Take  1 tablet (20 mg total) by mouth daily. Must be seen before any further refills. ?  ?glipiZIDE 10 MG tablet ?Commonly known as: GLUCOTROL ?Take 2 tablets (20 mg total) by mouth daily before breakfast AND 1 tablet (10 mg total) daily before supper. ?  ?glucose blood test strip ?Commonly known as: ONE TOUCH ULTRA TEST ?CHECK BLOOD SUGAR 2 TIMES A DAY ?  ?HYDROcodone-acetaminophen 5-325 MG tablet ?Commonly known as: NORCO/VICODIN ?Take 1 tablet by mouth every 6 (six) hours as needed for moderate pain or severe pain. ?  ?icosapent Ethyl 1 g capsule ?Commonly known as: VASCEPA ?TAKE (2) CAPSULES TWICE DAILY. ?  ?insulin lispro 100 UNIT/ML KwikPen ?Commonly known as: HumaLOG KwikPen ?Max Daily 25 units ?  ?Insulin Pen Needle 32G X 4 MM Misc ?1 Device by  Does not apply route 3 (three) times daily. ?  ?lidocaine-prilocaine cream ?Commonly known as: EMLA ?Apply 1 application topically as needed. ?  ?magic mouthwash w/lidocaine Soln ?Take 5 mLs by mouth 3 (three) times daily as needed for mouth pain. benadryl  525 mg, hydrocortisone 60 mg and nystatin 0.6 mg. ?  ?meclizine 12.5 MG tablet ?Commonly known as: ANTIVERT ?Take 1 tablet (12.5 mg total) by mouth 3 (three) times daily as needed for dizziness. ?  ?metoprolol tartrate 25 MG tablet ?Commonly known as: LOPRESSOR ?TAKE  (1)  TABLET TWICE A DAY. ?  ?mupirocin cream 2 % ?Commonly known as: Bactroban ?Apply 1 application topically 3 (three) times daily. ?  ?olmesartan 20 MG tablet ?Commonly known as: BENICAR ?Take 1 tablet (20 mg total) by mouth daily. ?  ?ondansetron 8 MG tablet ?Commonly known as: Zofran ?Take 1 tablet (8 mg total) by mouth 2 (two) times daily as needed. Start on the third day after chemotherapy. ?  ?Praluent 150 MG/ML Soaj ?Generic drug: Alirocumab ?Inject 1 Dose into the skin every 14 (fourteen) days. ?  ?pregabalin 50 MG capsule ?Commonly known as: LYRICA ?TAKE ONE CAPSULE BY MOUTH TWICE DAILY ?  ?prochlorperazine 10 MG tablet ?Commonly known as: COMPAZINE ?Take 1 tablet (10 mg total) by mouth every 6 (six) hours as needed (Nausea or vomiting). ?  ?Restasis 0.05 % ophthalmic emulsion ?Generic drug: cycloSPORINE ?Place 2 drops into both eyes 2 (two) times daily as needed. ?  ?Rybelsus 7 MG Tabs ?Generic drug: Semaglutide ?Take 1 tablet by mouth daily before breakfast. ?  ?Vitamin D3 250 MCG (10000 UT) Tabs ?Take by mouth 3 (three) times a week. ?  ?Voltaren 1 % Gel ?Generic drug: diclofenac Sodium ?APPLY 4 GRAMS TO AFFECTED AREA(S) 4 TIMES A DAY AS NEEDED ?  ?diclofenac Sodium 1 % Gel ?Commonly known as: VOLTAREN ?APPLY 4 GRAMS TO AFFECTED AREA(S) 4 TIMES A DAY AS NEEDED ?  ? ?  ? ? ?Allergies:  ?Allergies  ?Allergen Reactions  ? Cymbalta [Duloxetine Hcl] Other (See Comments)  ?  Profuse sweating   ? Wilder Glade [Dapagliflozin] Other (See Comments)  ?  Dizziness  ? Januvia [Sitagliptin] Swelling  ? Meloxicam Nausea And Vomiting  ? Shellfish Allergy Other (See Comments)  ?  Gout  ? Trulicity [Dulaglutide] Nausea Only  ? Crestor [Rosuvastatin] Other (See Comments)  ?  Muscle aches on 5 mg daily and 10 mg twice weekly  ? Diclofenac Diarrhea  ? Lipitor [Atorvastatin] Other (See Comments)  ?  Muscle aches  ? Livalo [Pitavastatin] Other (See Comments)  ?   ?  ? Sulfa Antibiotics Rash  ? Tape Other (See Comments)  ?  Causes skin irritation  ?  Zetia [Ezetimibe] Other (See Comments)  ?  Muscle aches  ? Zocor [Simvastatin] Other (See Comments)  ?  Muscle aches   ? ? ?Past Medical History, Surgical history, Social history, and Family History were reviewed and updated. ? ?Review of Systems: ?Review of Systems  ?Constitutional: Negative.   ?HENT:  Positive for sore throat.   ?Eyes: Negative.   ?Respiratory: Negative.    ?Cardiovascular: Negative.   ?Gastrointestinal: Negative.   ?Genitourinary: Negative.   ?Musculoskeletal: Negative.   ?Skin: Negative.   ?Neurological: Negative.   ?Endo/Heme/Allergies: Negative.   ?Psychiatric/Behavioral: Negative.    ? ? ?Physical Exam: ? height is 5' 3"  (1.6 m) and weight is 179 lb (81.2 kg). Her oral temperature is 98.4 ?F (36.9 ?C). Her blood pressure is 164/71 (abnormal) and her pulse is 86. Her respiration is 18 and oxygen saturation is 100%.  ? ?Wt Readings from Last 3 Encounters:  ?06/11/21 179 lb (81.2 kg)  ?05/21/21 174 lb 1.9 oz (79 kg)  ?04/30/21 179 lb (81.2 kg)  ? ? ?Physical Exam ?Vitals reviewed.  ?HENT:  ?   Head: Normocephalic and atraumatic.  ?Eyes:  ?   Pupils: Pupils are equal, round, and reactive to light.  ?Cardiovascular:  ?   Rate and Rhythm: Normal rate and regular rhythm.  ?   Heart sounds: Normal heart sounds.  ?Pulmonary:  ?   Effort: Pulmonary effort is normal.  ?   Breath sounds: Normal breath sounds.  ?Abdominal:  ?   General: Bowel sounds are normal.  ?    Palpations: Abdomen is soft.  ?Musculoskeletal:     ?   General: No tenderness or deformity. Normal range of motion.  ?   Cervical back: Normal range of motion.  ?Lymphadenopathy:  ?   Cervical: No cervical aden

## 2021-06-11 NOTE — Patient Instructions (Signed)
[facility]  Discharge Instructions: ?Thank you for choosing [facility] to provide your oncology and hematology care.  ? ?If you have a lab appointment with the [facility], please go directly to the [facility] and check in at the registration area. ? ?Wear comfortable clothing and clothing appropriate for easy access to any Portacath or PICC line.  ? ?We strive to give you quality time with your provider. You may need to reschedule your appointment if you arrive late (15 or more minutes).  Arriving late affects you and other patients whose appointments are after yours.  Also, if you miss three or more appointments without notifying the office, you may be dismissed from the clinic at the provider?s discretion.    ?  ?For prescription refill requests, have your pharmacy contact our office and allow 72 hours for refills to be completed.   ? ?Today you received the following chemotherapy and/or immunotherapy agents Adriamycin and Cytoxan    ?  ?To help prevent nausea and vomiting after your treatment, we encourage you to take your nausea medication as directed. ? ?BELOW ARE SYMPTOMS THAT SHOULD BE REPORTED IMMEDIATELY: ?*FEVER GREATER THAN 100.4 F (38 ?C) OR HIGHER ?*CHILLS OR SWEATING ?*NAUSEA AND VOMITING THAT IS NOT CONTROLLED WITH YOUR NAUSEA MEDICATION ?*UNUSUAL SHORTNESS OF BREATH ?*UNUSUAL BRUISING OR BLEEDING ?*URINARY PROBLEMS (pain or burning when urinating, or frequent urination) ?*BOWEL PROBLEMS (unusual diarrhea, constipation, pain near the anus) ?TENDERNESS IN MOUTH AND THROAT WITH OR WITHOUT PRESENCE OF ULCERS (sore throat, sores in mouth, or a toothache) ?UNUSUAL RASH, SWELLING OR PAIN  ?UNUSUAL VAGINAL DISCHARGE OR ITCHING  ? ?Items with * indicate a potential emergency and should be followed up as soon as possible or go to the Emergency Department if any problems should occur. ? ?Please show the CHEMOTHERAPY ALERT CARD or IMMUNOTHERAPY ALERT CARD at  check-in to the Emergency Department and triage nurse. ?Should you have questions after your visit or need to cancel or reschedule your appointment, please contact [facility]  [phone] and follow the prompts.  Office hours are 8:00 a.m. to 4:30 p.m. Monday - Friday. Please note that voicemails left after 4:00 p.m. may not be returned until the following business day.  We are closed weekends and major holidays. You have access to a nurse at all times for urgent questions. Please call the main number to the clinic [phone] and follow the prompts. ? ?For any non-urgent questions, you may also contact your provider using MyChart. We now offer e-Visits for anyone 52 and older to request care online for non-urgent symptoms. For details visit [email or web address]. ?  ?Also download the MyChart app! Go to the app store, search "MyChart", open the app, select [facility], and log in with your MyChart username and password. ? ?Due to Covid, a mask is required upon entering the hospital/clinic. If you do not have a mask, one will be given to you upon arrival. For doctor visits, patients may have 1 support person aged 23 or older with them. For treatment visits, patients cannot have anyone with them due to current Covid guidelines and our immunocompromised population.  ?

## 2021-06-11 NOTE — Telephone Encounter (Signed)
Pt scheduled for appt  08/14/21, stated she can not come in earlier due to her going through Chemotherapy ?

## 2021-06-11 NOTE — Progress Notes (Signed)
Patient is here for cycle five. She report not have much of a recovery between her cycles this time. She is excited that after today, she only has one more cycle. She report more nausea this time. Dr Marin Olp will send in a new medication to try and address this.  ? ?Oncology Nurse Navigator Documentation ? ? ?  06/11/2021  ? 12:30 PM  ?Oncology Nurse Navigator Flowsheets  ?Navigator Follow Up Date: 07/02/2021  ?Navigator Follow Up Reason: Follow-up Appointment;Chemotherapy  ?Navigator Location [facility]  ?Navigator Encounter Type Treatment;Appt/Treatment Plan Review  ?Patient Visit Type MedOnc  ?Treatment Phase Active Tx  ?Barriers/Navigation Needs Coordination of Care;Education  ?Interventions Psycho-Social Support  ?Acuity Level 2-Minimal Needs (1-2 Barriers Identified)  ?Support Groups/Services Friends and Family  ?Time Spent with Patient 30  ?  ?

## 2021-06-11 NOTE — Patient Instructions (Signed)

## 2021-06-12 ENCOUNTER — Telehealth: Payer: Self-pay | Admitting: *Deleted

## 2021-06-12 ENCOUNTER — Encounter: Payer: Self-pay | Admitting: Hematology & Oncology

## 2021-06-12 ENCOUNTER — Encounter: Payer: Self-pay | Admitting: Family

## 2021-06-12 LAB — IRON AND IRON BINDING CAPACITY (CC-WL,HP ONLY)
Iron: 71 ug/dL (ref 28–170)
Saturation Ratios: 26 % (ref 10.4–31.8)
TIBC: 276 ug/dL (ref 250–450)
UIBC: 205 ug/dL (ref 148–442)

## 2021-06-12 LAB — FERRITIN: Ferritin: 193 ng/mL (ref 11–307)

## 2021-06-12 NOTE — Telephone Encounter (Signed)
-----   Message from Volanda Napoleon, MD sent at 06/12/2021 11:21 AM EDT ----- ?Please call and let her know that the iron levels are still okay.  I do not think she needs to be phlebotomized yet.  Thanks.  Pete ?

## 2021-06-12 NOTE — Telephone Encounter (Signed)
Pt notified per order of Dr. Marin Olp "that the iron levels are still okay and that she does not need to be phlebotomized yet."  Pt appreciative of call and has no questions or concerns at this time.  ?

## 2021-06-13 ENCOUNTER — Other Ambulatory Visit: Payer: Self-pay

## 2021-06-13 ENCOUNTER — Inpatient Hospital Stay: Payer: Medicare Other

## 2021-06-13 VITALS — BP 150/50 | HR 70 | Temp 98.4°F | Resp 19

## 2021-06-13 DIAGNOSIS — Z5189 Encounter for other specified aftercare: Secondary | ICD-10-CM | POA: Diagnosis not present

## 2021-06-13 DIAGNOSIS — Z171 Estrogen receptor negative status [ER-]: Secondary | ICD-10-CM | POA: Diagnosis not present

## 2021-06-13 DIAGNOSIS — K1230 Oral mucositis (ulcerative), unspecified: Secondary | ICD-10-CM | POA: Diagnosis not present

## 2021-06-13 DIAGNOSIS — C50912 Malignant neoplasm of unspecified site of left female breast: Secondary | ICD-10-CM | POA: Diagnosis not present

## 2021-06-13 DIAGNOSIS — Z5111 Encounter for antineoplastic chemotherapy: Secondary | ICD-10-CM | POA: Diagnosis not present

## 2021-06-13 MED ORDER — PEGFILGRASTIM-BMEZ 6 MG/0.6ML ~~LOC~~ SOSY
6.0000 mg | PREFILLED_SYRINGE | Freq: Once | SUBCUTANEOUS | Status: AC
  Administered 2021-06-13: 6 mg via SUBCUTANEOUS
  Filled 2021-06-13: qty 0.6

## 2021-06-13 NOTE — Patient Instructions (Signed)

## 2021-06-24 ENCOUNTER — Other Ambulatory Visit: Payer: Self-pay | Admitting: *Deleted

## 2021-06-24 ENCOUNTER — Encounter: Payer: Self-pay | Admitting: *Deleted

## 2021-06-24 MED ORDER — ACYCLOVIR 400 MG PO TABS: 400.0000 mg | ORAL_TABLET | Freq: Three times a day (TID) | ORAL | 1 refills | Status: DC

## 2021-06-24 NOTE — Progress Notes (Signed)
Patient is callinge because the "chemo blisters" that she previously had on her mouth and nose during last cycles, are now affecting her rectum. She states they are sore and have bled some. They are all external that she can tell. She purchased a sitz bath and has been soaking in epsom salts and she believes she's a bit better.  ? ?She is also planning a visit to [place] about 5 hours from here after her next cycle. She wants to make sure this okay with Dr Marin Olp. She will be with her daughter, who will take good care of her. ? ?Reviewed with Dr Marin Olp. He would like patient to start on acyclovir '400mg'$  TID. He is also okay with the patient going to [place]. ? ?Patient is aware of new medication. Pharmacy confirmed. She is also aware of Dr Antonieta Pert okay with her travel.  ? ?Oncology Nurse Navigator Documentation ? ? ?  06/24/2021  ? 11:30 AM  ?Oncology Nurse Navigator Flowsheets  ?Navigator Follow Up Date: 07/02/2021  ?Navigator Follow Up Reason: Follow-up Appointment;Chemotherapy  ?Navigator Location [facility]  ?Navigator Encounter Type Telephone  ?Telephone Symptom Mgt;Incoming Call  ?Patient Visit Type MedOnc  ?Treatment Phase Active Tx  ?Barriers/Navigation Needs Coordination of Care;Education  ?Education Pain/ Symptom Management  ?Interventions Education;Psycho-Social Support  ?Acuity Level 2-Minimal Needs (1-2 Barriers Identified)  ?Education Method Verbal  ?Support Groups/Services Friends and Family  ?Time Spent with Patient 30  ?  ? ? ?

## 2021-06-27 ENCOUNTER — Other Ambulatory Visit: Payer: Self-pay | Admitting: Internal Medicine

## 2021-06-27 ENCOUNTER — Ambulatory Visit: Payer: Medicare Other | Admitting: Internal Medicine

## 2021-06-30 ENCOUNTER — Ambulatory Visit: Payer: Medicare Other

## 2021-07-02 ENCOUNTER — Inpatient Hospital Stay (HOSPITAL_BASED_OUTPATIENT_CLINIC_OR_DEPARTMENT_OTHER): Payer: Medicare Other | Admitting: Hematology & Oncology

## 2021-07-02 ENCOUNTER — Inpatient Hospital Stay: Payer: Medicare Other

## 2021-07-02 ENCOUNTER — Encounter: Payer: Self-pay | Admitting: *Deleted

## 2021-07-02 ENCOUNTER — Other Ambulatory Visit: Payer: Self-pay

## 2021-07-02 ENCOUNTER — Encounter: Payer: Self-pay | Admitting: Hematology & Oncology

## 2021-07-02 ENCOUNTER — Telehealth: Payer: Self-pay | Admitting: Radiation Oncology

## 2021-07-02 ENCOUNTER — Inpatient Hospital Stay: Payer: Medicare Other | Attending: Hematology & Oncology

## 2021-07-02 VITALS — BP 146/54 | HR 76 | Temp 98.3°F | Resp 18 | Ht 63.0 in | Wt 176.0 lb

## 2021-07-02 DIAGNOSIS — C50912 Malignant neoplasm of unspecified site of left female breast: Secondary | ICD-10-CM | POA: Insufficient documentation

## 2021-07-02 DIAGNOSIS — Z171 Estrogen receptor negative status [ER-]: Secondary | ICD-10-CM | POA: Insufficient documentation

## 2021-07-02 DIAGNOSIS — Z5111 Encounter for antineoplastic chemotherapy: Secondary | ICD-10-CM | POA: Insufficient documentation

## 2021-07-02 DIAGNOSIS — Z5189 Encounter for other specified aftercare: Secondary | ICD-10-CM | POA: Insufficient documentation

## 2021-07-02 LAB — CBC WITH DIFFERENTIAL (CANCER CENTER ONLY)
Abs Immature Granulocytes: 0.16 10*3/uL — ABNORMAL HIGH (ref 0.00–0.07)
Basophils Absolute: 0.1 10*3/uL (ref 0.0–0.1)
Basophils Relative: 1 %
Eosinophils Absolute: 0 10*3/uL (ref 0.0–0.5)
Eosinophils Relative: 0 %
HCT: 31.3 % — ABNORMAL LOW (ref 36.0–46.0)
Hemoglobin: 10.5 g/dL — ABNORMAL LOW (ref 12.0–15.0)
Immature Granulocytes: 3 %
Lymphocytes Relative: 9 %
Lymphs Abs: 0.5 10*3/uL — ABNORMAL LOW (ref 0.7–4.0)
MCH: 32.8 pg (ref 26.0–34.0)
MCHC: 33.5 g/dL (ref 30.0–36.0)
MCV: 97.8 fL (ref 80.0–100.0)
Monocytes Absolute: 0.6 10*3/uL (ref 0.1–1.0)
Monocytes Relative: 10 %
Neutro Abs: 4.5 10*3/uL (ref 1.7–7.7)
Neutrophils Relative %: 77 %
Platelet Count: 178 10*3/uL (ref 150–400)
RBC: 3.2 MIL/uL — ABNORMAL LOW (ref 3.87–5.11)
RDW: 16.7 % — ABNORMAL HIGH (ref 11.5–15.5)
WBC Count: 5.8 10*3/uL (ref 4.0–10.5)
nRBC: 0 % (ref 0.0–0.2)

## 2021-07-02 LAB — CMP (CANCER CENTER ONLY)
ALT: 13 U/L (ref 0–44)
AST: 17 U/L (ref 15–41)
Albumin: 3.8 g/dL (ref 3.5–5.0)
Alkaline Phosphatase: 58 U/L (ref 38–126)
Anion gap: 10 (ref 5–15)
BUN: 14 mg/dL (ref 8–23)
CO2: 26 mmol/L (ref 22–32)
Calcium: 9.3 mg/dL (ref 8.9–10.3)
Chloride: 105 mmol/L (ref 98–111)
Creatinine: 0.99 mg/dL (ref 0.44–1.00)
GFR, Estimated: 57 mL/min — ABNORMAL LOW (ref >60)
Glucose, Bld: 214 mg/dL — ABNORMAL HIGH (ref 70–99)
Potassium: 3.8 mmol/L (ref 3.5–5.1)
Sodium: 141 mmol/L (ref 135–145)
Total Bilirubin: 0.3 mg/dL (ref 0.3–1.2)
Total Protein: 5.7 g/dL — ABNORMAL LOW (ref 6.5–8.1)

## 2021-07-02 MED ORDER — SODIUM CHLORIDE 0.9% FLUSH
10.0000 mL | INTRAVENOUS | Status: DC | PRN
  Administered 2021-07-02: 10 mL

## 2021-07-02 MED ORDER — SODIUM CHLORIDE 0.9 % IV SOLN
600.0000 mg/m2 | Freq: Once | INTRAVENOUS | Status: AC
  Administered 2021-07-02: 1140 mg via INTRAVENOUS
  Filled 2021-07-02: qty 57

## 2021-07-02 MED ORDER — SODIUM CHLORIDE 0.9 % IV SOLN
10.0000 mg | Freq: Once | INTRAVENOUS | Status: AC
  Administered 2021-07-02: 10 mg via INTRAVENOUS
  Filled 2021-07-02: qty 10

## 2021-07-02 MED ORDER — SODIUM CHLORIDE 0.9 % IV SOLN: Freq: Once | INTRAVENOUS | Status: AC

## 2021-07-02 MED ORDER — PALONOSETRON HCL INJECTION 0.25 MG/5ML
0.2500 mg | Freq: Once | INTRAVENOUS | Status: AC
  Administered 2021-07-02: 0.25 mg via INTRAVENOUS
  Filled 2021-07-02: qty 5

## 2021-07-02 MED ORDER — DOXORUBICIN HCL CHEMO IV INJECTION 2 MG/ML
54.0000 mg/m2 | Freq: Once | INTRAVENOUS | Status: AC
  Administered 2021-07-02: 102 mg via INTRAVENOUS
  Filled 2021-07-02: qty 51

## 2021-07-02 MED ORDER — SODIUM CHLORIDE 0.9 % IV SOLN
150.0000 mg | Freq: Once | INTRAVENOUS | Status: AC
  Administered 2021-07-02: 150 mg via INTRAVENOUS
  Filled 2021-07-02: qty 150

## 2021-07-02 MED ORDER — HEPARIN SOD (PORK) LOCK FLUSH 100 UNIT/ML IV SOLN
500.0000 [IU] | Freq: Once | INTRAVENOUS | Status: AC | PRN
  Administered 2021-07-02: 500 [IU]

## 2021-07-02 NOTE — Patient Instructions (Signed)
[facility]  Discharge Instructions: ?Thank you for choosing [facility] to provide your oncology and hematology care.  ? ?If you have a lab appointment with the [facility], please go directly to the [facility] and check in at the registration area. ? ?Wear comfortable clothing and clothing appropriate for easy access to any Portacath or PICC line.  ? ?We strive to give you quality time with your provider. You may need to reschedule your appointment if you arrive late (15 or more minutes).  Arriving late affects you and other patients whose appointments are after yours.  Also, if you miss three or more appointments without notifying the office, you may be dismissed from the clinic at the provider?s discretion.    ?  ?For prescription refill requests, have your pharmacy contact our office and allow 72 hours for refills to be completed.   ? ?Today you received the following chemotherapy and/or immunotherapy agents Adriamycin and Cytoxan    ?  ?To help prevent nausea and vomiting after your treatment, we encourage you to take your nausea medication as directed. ? ?BELOW ARE SYMPTOMS THAT SHOULD BE REPORTED IMMEDIATELY: ?*FEVER GREATER THAN 100.4 F (38 ?C) OR HIGHER ?*CHILLS OR SWEATING ?*NAUSEA AND VOMITING THAT IS NOT CONTROLLED WITH YOUR NAUSEA MEDICATION ?*UNUSUAL SHORTNESS OF BREATH ?*UNUSUAL BRUISING OR BLEEDING ?*URINARY PROBLEMS (pain or burning when urinating, or frequent urination) ?*BOWEL PROBLEMS (unusual diarrhea, constipation, pain near the anus) ?TENDERNESS IN MOUTH AND THROAT WITH OR WITHOUT PRESENCE OF ULCERS (sore throat, sores in mouth, or a toothache) ?UNUSUAL RASH, SWELLING OR PAIN  ?UNUSUAL VAGINAL DISCHARGE OR ITCHING  ? ?Items with * indicate a potential emergency and should be followed up as soon as possible or go to the Emergency Department if any problems should occur. ? ?Please show the CHEMOTHERAPY ALERT CARD or IMMUNOTHERAPY ALERT CARD at  check-in to the Emergency Department and triage nurse. ?Should you have questions after your visit or need to cancel or reschedule your appointment, please contact [facility]  [phone] and follow the prompts.  Office hours are 8:00 a.m. to 4:30 p.m. Monday - Friday. Please note that voicemails left after 4:00 p.m. may not be returned until the following business day.  We are closed weekends and major holidays. You have access to a nurse at all times for urgent questions. Please call the main number to the clinic [phone] and follow the prompts. ? ?For any non-urgent questions, you may also contact your provider using MyChart. We now offer e-Visits for anyone 33 and older to request care online for non-urgent symptoms. For details visit [email or web address]. ?  ?Also download the MyChart app! Go to the app store, search "MyChart", open the app, select [facility], and log in with your MyChart username and password. ? ?Due to Covid, a mask is required upon entering the hospital/clinic. If you do not have a mask, one will be given to you upon arrival. For doctor visits, patients may have 1 support person aged 32 or older with them. For treatment visits, patients cannot have anyone with them due to current Covid guidelines and our immunocompromised population.  ?

## 2021-07-02 NOTE — Patient Instructions (Signed)

## 2021-07-02 NOTE — Progress Notes (Signed)
Patient is here for her last treatment. She is very happy about this. She will be spending some time with her daughter after this treatment. We will see her back in one month and decide then if additional treatment is necessary.  ? ?Oncology Nurse Navigator Documentation ? ? ?  07/02/2021  ? 11:45 AM  ?Oncology Nurse Navigator Flowsheets  ?Phase of Treatment Chemo  ?Chemotherapy Actual End Date: 07/02/2021  ?Navigator Follow Up Date: 08/07/2021  ?Navigator Follow Up Reason: Follow-up Appointment  ?Navigator Location [facility]  ?Navigator Encounter Type Treatment;Appt/Treatment Plan Review  ?Patient Visit Type MedOnc  ?Treatment Phase Final Chemo TX  ?Barriers/Navigation Needs Coordination of Care;Education  ?Interventions Psycho-Social Support  ?Acuity Level 2-Minimal Needs (1-2 Barriers Identified)  ?Support Groups/Services Friends and Family  ?Time Spent with Patient 15  ?  ?

## 2021-07-02 NOTE — Telephone Encounter (Signed)
4/12 @ 3:47 pm Left voicemail for patient to call our office. ?

## 2021-07-02 NOTE — Progress Notes (Signed)
?Hematology and Oncology Follow Up Visit ? ?Carmen [name] ?[id number] ?04-12-1940 81 y.o. ?07/02/2021 ? ? ?Principle Diagnosis:  ?Invasive ductal carcinoma of the left breast-stage IIA (T2N0M0) -- TRIPLE NEGATIVE -- s/p LEFT lobectomy on 01/14/2021 ?Hemochromatosis (double heterozygote for C282Y and S65C ?mutations). ?  ?Current Therapy:  ?Phlebotomy to maintain ferritin less than 100 ?Adriamycin/Cytoxan - started adjuvant therapy on 02/27/2021, s/p cycle #5/6 ?  ?Interim History:  Carmen [name] is here today for follow-up and treatment.  She did have a very nice Easter.  Her family came over to be with her. ? ?So happy that she is gone through treatment so nicely.  This will be her final treatment.  After this, we will get her over to Radiation Oncology. ? ?I think the real issue is whether not she will need radiation at her age.  Hopefully, she might need a reduced dose.  I will have to let the Radiation Oncologist deal with this. ? ?She has had no problems with diarrhea.  She has had no rashes.  She has had no leg swelling. ? ?She has had some nausea.  I think the olanzapine may have helped her a little bit. ? ?She also has a hemochromatosis.  This is a secondary issue right now.  When we last saw her back in March, her ferritin was was 193 with an iron saturation of 26%. ? ?She has had no problems mouth sores.  She has had no headache. ? ?Overall, I would say her performance status is probably ECOG 1.   ? ?Medications:  ?Allergies as of 07/02/2021   ? ?   Reactions  ? Zofran [ondansetron Hcl] Other (See Comments)  ? Severe headache and abdominal pain  ? Cymbalta [duloxetine Hcl] Other (See Comments)  ? Profuse sweating  ? Wilder Glade [dapagliflozin] Other (See Comments)  ? Dizziness  ? Januvia [sitagliptin] Swelling  ? Meloxicam Nausea And Vomiting  ? Shellfish Allergy Other (See Comments)  ? Gout  ? Trulicity [dulaglutide] Nausea Only  ? Crestor [rosuvastatin] Other (See Comments)  ? Muscle aches on 5 mg daily and  10 mg twice weekly  ? Diclofenac Diarrhea  ? Lipitor [atorvastatin] Other (See Comments)  ? Muscle aches  ? Livalo [pitavastatin] Other (See Comments)  ?   ? Sulfa Antibiotics Rash  ? Tape Other (See Comments)  ? Causes skin irritation  ? Zetia [ezetimibe] Other (See Comments)  ? Muscle aches  ? Zocor [simvastatin] Other (See Comments)  ? Muscle aches   ? ?  ? ?  ?Medication List  ?  ? ?  ? Accurate as of July 02, 2021 12:46 PM. If you have any questions, ask your nurse or doctor.  ?  ?  ? ?  ? ?Accu-Chek [employer] Kit ?Use to check Blood Sugars ?  ?acyclovir 400 MG tablet ?Commonly known as: ZOVIRAX ?Take 1 tablet (400 mg total) by mouth 3 (three) times daily. ?  ?allopurinol 100 MG tablet ?Commonly known as: ZYLOPRIM ?TAKE  (1)  TABLET TWICE A DAY.  (NEEDS TO BE SEEN BEFORE NEXT REFILL) ?  ?antiseptic oral rinse Liqd ?15 mLs by Mouth Rinse route as needed for dry mouth. ?  ?aspirin 81 MG tablet ?Take 81 mg by mouth daily. ?  ?colchicine 0.6 MG tablet ?TAKE (1) TABLET DAILY AS NEEDED. ?  ?dexamethasone 4 MG tablet ?Commonly known as: DECADRON ?Take 2 tablets (8 mg total) by mouth daily. Take daily for 3 days after chemo. Take with food. ?  ?  fluticasone 50 MCG/ACT nasal spray ?Commonly known as: FLONASE ?SPRAY 1 SPRAY IN EACH NOSTRIL ONCE DAILY. ?  ?furosemide 20 MG tablet ?Commonly known as: LASIX ?Take 1 tablet (20 mg total) by mouth daily. Must be seen before any further refills. ?  ?glipiZIDE 10 MG tablet ?Commonly known as: GLUCOTROL ?Take 2 tablets (20 mg total) by mouth daily before breakfast AND 1 tablet (10 mg total) daily before supper. ?  ?glucose blood test strip ?Commonly known as: ONE TOUCH ULTRA TEST ?CHECK BLOOD SUGAR 2 TIMES A DAY ?  ?HYDROcodone-acetaminophen 5-325 MG tablet ?Commonly known as: NORCO/VICODIN ?Take 1 tablet by mouth every 6 (six) hours as needed for moderate pain or severe pain. ?  ?icosapent Ethyl 1 g capsule ?Commonly known as: VASCEPA ?TAKE (2) CAPSULES TWICE DAILY. ?   ?insulin lispro 100 UNIT/ML KwikPen ?Commonly known as: HumaLOG KwikPen ?Max Daily 25 units ?  ?Insulin Pen Needle 32G X 4 MM Misc ?1 Device by Does not apply route 3 (three) times daily. ?  ?lidocaine-prilocaine cream ?Commonly known as: EMLA ?Apply 1 application topically as needed. ?  ?magic mouthwash w/lidocaine Soln ?Take 5 mLs by mouth 3 (three) times daily as needed for mouth pain. benadryl  525 mg, hydrocortisone 60 mg and nystatin 0.6 mg. ?  ?meclizine 12.5 MG tablet ?Commonly known as: ANTIVERT ?Take 1 tablet (12.5 mg total) by mouth 3 (three) times daily as needed for dizziness. ?  ?metoprolol tartrate 25 MG tablet ?Commonly known as: LOPRESSOR ?TAKE  (1)  TABLET TWICE A DAY. ?  ?mupirocin cream 2 % ?Commonly known as: Bactroban ?Apply 1 application topically 3 (three) times daily. ?  ?OLANZapine 10 MG tablet ?Commonly known as: ZYPREXA ?Take 1 tablet (10 mg total) by mouth at bedtime. ?  ?olmesartan 20 MG tablet ?Commonly known as: BENICAR ?Take 1 tablet (20 mg total) by mouth daily. ?  ?Praluent 150 MG/ML Soaj ?Generic drug: Alirocumab ?Inject 1 Dose into the skin every 14 (fourteen) days. ?  ?pregabalin 50 MG capsule ?Commonly known as: LYRICA ?TAKE ONE CAPSULE BY MOUTH TWICE DAILY ?  ?prochlorperazine 10 MG tablet ?Commonly known as: COMPAZINE ?Take 1 tablet (10 mg total) by mouth every 6 (six) hours as needed (Nausea or vomiting). ?  ?Restasis 0.05 % ophthalmic emulsion ?Generic drug: cycloSPORINE ?Place 2 drops into both eyes 2 (two) times daily as needed. ?  ?Rybelsus 7 MG Tabs ?Generic drug: Semaglutide ?Take 1 tablet by mouth daily before breakfast. ?  ?Vitamin D3 250 MCG (10000 UT) Tabs ?Take by mouth 3 (three) times a week. ?  ?Voltaren 1 % Gel ?Generic drug: diclofenac Sodium ?APPLY 4 GRAMS TO AFFECTED AREA(S) 4 TIMES A DAY AS NEEDED ?  ?diclofenac Sodium 1 % Gel ?Commonly known as: VOLTAREN ?APPLY 4 GRAMS TO AFFECTED AREA(S) 4 TIMES A DAY AS NEEDED ?  ? ?  ? ? ?Allergies:  ?Allergies   ?Allergen Reactions  ? Zofran [Ondansetron Hcl] Other (See Comments)  ?  Severe headache and abdominal pain  ? Cymbalta [Duloxetine Hcl] Other (See Comments)  ?  Profuse sweating  ? Wilder Glade [Dapagliflozin] Other (See Comments)  ?  Dizziness  ? Januvia [Sitagliptin] Swelling  ? Meloxicam Nausea And Vomiting  ? Shellfish Allergy Other (See Comments)  ?  Gout  ? Trulicity [Dulaglutide] Nausea Only  ? Crestor [Rosuvastatin] Other (See Comments)  ?  Muscle aches on 5 mg daily and 10 mg twice weekly  ? Diclofenac Diarrhea  ? Lipitor [Atorvastatin] Other (See Comments)  ?  Muscle aches  ? Livalo [Pitavastatin] Other (See Comments)  ?   ?  ? Sulfa Antibiotics Rash  ? Tape Other (See Comments)  ?  Causes skin irritation  ? Zetia [Ezetimibe] Other (See Comments)  ?  Muscle aches  ? Zocor [Simvastatin] Other (See Comments)  ?  Muscle aches   ? ? ?Past Medical History, Surgical history, Social history, and Family History were reviewed and updated. ? ?Review of Systems: ?Review of Systems  ?Constitutional: Negative.   ?HENT:  Positive for sore throat.   ?Eyes: Negative.   ?Respiratory: Negative.    ?Cardiovascular: Negative.   ?Gastrointestinal: Negative.   ?Genitourinary: Negative.   ?Musculoskeletal: Negative.   ?Skin: Negative.   ?Neurological: Negative.   ?Endo/Heme/Allergies: Negative.   ?Psychiatric/Behavioral: Negative.    ? ? ?Physical Exam: ? height is 5' 3"  (1.6 m) and weight is 176 lb (79.8 kg). Her oral temperature is 98.3 ?F (36.8 ?C). Her blood pressure is 146/54 (abnormal) and her pulse is 76. Her respiration is 18 and oxygen saturation is 96%.  ? ?Wt Readings from Last 3 Encounters:  ?07/02/21 176 lb (79.8 kg)  ?06/11/21 179 lb (81.2 kg)  ?05/21/21 174 lb 1.9 oz (79 kg)  ? ? ?Physical Exam ?Vitals reviewed.  ?HENT:  ?   Head: Normocephalic and atraumatic.  ?Eyes:  ?   Pupils: Pupils are equal, round, and reactive to light.  ?Cardiovascular:  ?   Rate and Rhythm: Normal rate and regular rhythm.  ?   Heart sounds:  Normal heart sounds.  ?Pulmonary:  ?   Effort: Pulmonary effort is normal.  ?   Breath sounds: Normal breath sounds.  ?Abdominal:  ?   General: Bowel sounds are normal.  ?   Palpations: Abdomen is soft.  ?Musc

## 2021-07-03 ENCOUNTER — Other Ambulatory Visit: Payer: Self-pay | Admitting: Family Medicine

## 2021-07-03 LAB — IRON AND IRON BINDING CAPACITY (CC-WL,HP ONLY)
Iron: 84 ug/dL (ref 28–170)
Saturation Ratios: 32 % — ABNORMAL HIGH (ref 10.4–31.8)
TIBC: 260 ug/dL (ref 250–450)
UIBC: 176 ug/dL (ref 148–442)

## 2021-07-03 LAB — FERRITIN: Ferritin: 263 ng/mL (ref 11–307)

## 2021-07-04 ENCOUNTER — Inpatient Hospital Stay: Payer: Medicare Other

## 2021-07-04 VITALS — BP 179/59 | HR 73 | Temp 98.6°F | Resp 18

## 2021-07-04 DIAGNOSIS — Z5111 Encounter for antineoplastic chemotherapy: Secondary | ICD-10-CM | POA: Diagnosis not present

## 2021-07-04 DIAGNOSIS — C50912 Malignant neoplasm of unspecified site of left female breast: Secondary | ICD-10-CM | POA: Diagnosis not present

## 2021-07-04 DIAGNOSIS — Z171 Estrogen receptor negative status [ER-]: Secondary | ICD-10-CM | POA: Diagnosis not present

## 2021-07-04 DIAGNOSIS — Z5189 Encounter for other specified aftercare: Secondary | ICD-10-CM | POA: Diagnosis not present

## 2021-07-04 MED ORDER — PEGFILGRASTIM-BMEZ 6 MG/0.6ML ~~LOC~~ SOSY
6.0000 mg | PREFILLED_SYRINGE | Freq: Once | SUBCUTANEOUS | Status: AC
  Administered 2021-07-04: 6 mg via SUBCUTANEOUS
  Filled 2021-07-04: qty 0.6

## 2021-07-04 NOTE — Progress Notes (Signed)
Ok to give Ziextenzo with elevated BP per Dr Marin Olp ?

## 2021-07-04 NOTE — Patient Instructions (Signed)

## 2021-07-07 ENCOUNTER — Inpatient Hospital Stay
Admission: RE | Admit: 2021-07-07 | Discharge: 2021-07-07 | Disposition: A | Discharge status: Home / Self Care | Payer: Self-pay | Source: Ambulatory Visit | Attending: Radiation Oncology | Admitting: Radiation Oncology

## 2021-07-07 ENCOUNTER — Ambulatory Visit
Admission: RE | Admit: 2021-07-07 | Discharge: 2021-07-07 | Disposition: A | Discharge status: Home / Self Care | Payer: Self-pay | Source: Ambulatory Visit | Attending: Radiation Oncology | Admitting: Radiation Oncology

## 2021-07-07 ENCOUNTER — Other Ambulatory Visit: Payer: Self-pay | Admitting: Radiation Oncology

## 2021-07-07 DIAGNOSIS — C50912 Malignant neoplasm of unspecified site of left female breast: Secondary | ICD-10-CM

## 2021-07-11 ENCOUNTER — Encounter: Payer: Self-pay | Admitting: Family

## 2021-07-11 ENCOUNTER — Encounter: Payer: Self-pay | Admitting: Hematology & Oncology

## 2021-07-14 NOTE — Progress Notes (Signed)
Location of Breast Cancer:  lower-outer quadrant of left breast  ? ?Histology per Pathology Report:  ?A. BREAST, LEFT W/SEED, LUMPECTOMY:  ?- Invasive ductal carcinoma, 2.2 cm.  ?- Margins not involved.  ?- Invasive carcinoma 0.2 cm from inferior and medial margins.  ?- Seborrheic keratosis of skin.  ?- See oncology table.  ? ?B. SENTINEL LYMPH NODE, LEFT AXILLARY #1, BIOPSY:  ?- One lymph node negative for metastatic carcinoma (0/1).  ?- Previous biopsy site.  ? ?C. SENTINEL LYMPH NODE, LEFT AXILLARY #2, BIOPSY:  ?- One lymph node negative for metastatic carcinoma (0/1).  ? ?D. SENTINEL LYMPH NODE, LEFT AXILLARY, BIOPSY:  ?- One lymph node negative for metastatic carcinoma (0/1).  ? ?E. SENTINEL LYMPH NODE, LEFT AXILLARY, BIOPSY:  ?- One lymph node negative for metastatic carcinoma (0/1).  ? ?F. SENTINEL LYMPH NODE, LEFT AXILLARY, BIOPSY:  ?- One lymph node negative for metastatic carcinoma (0/1).  ? ?G. LIPOMA, LEFT CHEST WALL, EXCISION:  ?- Benign adipose tissue consistent with lipoma. ? ?Receptor Status:  ?Estrogen Receptor: 0%, negative.  ?Progesterone Receptor: 0%, negative.  ?HER2: Equivocal with IHC.  Negative with FISH, ratio 1.56.  ?Ki-67: 40%.  ? ?Did patient present with symptoms (if so, please note symptoms) or was this found on screening mammography?:  routine mammogram done on 11/29/2020 showed a 1.9 x 1.9 x 1.1 cm mass at the 5 o'clock position in the left breast ? ?Past/Anticipated interventions by surgeon, if any:  ?PROCEDURE: LEFT BREAST LUMPECTOMY WITH RADIOACTIVE SEED LOCALIZATION AND DEEP LEFT AXILLARY SENTINEL LYMPH NODE BIOPSY (Left) ?INSERTION PORT-A-CATH (Right) ?EXCISION LEFT CHEST WALL LIPOMA (Left) ?  ?SURGEON: Carmen Kussmaul, MD  ? ?Past/Anticipated interventions by medical oncology, if any: Dr Marin Olp ?Current Therapy:  ?Phlebotomy to maintain ferritin less than 100 ?Adriamycin/Cytoxan - started adjuvant therapy on 02/27/2021 ? ?Lymphedema issues, if any:  no   ? ?Pain issues, if any:   no  ? ?SAFETY ISSUES: ?Prior radiation? no ?Pacemaker/ICD? no ?Possible current pregnancy?no, postmenopausal ?Is the patient on methotrexate? no ? ?Current Complaints / other details:  had seroma drained several times that has since resolved. ?   ?Vitals:  ? 07/23/21 1234  ?BP: (!) 158/84  ?Pulse: 99  ?Resp: 18  ?Temp: 97.6 ?F (36.4 ?C)  ?SpO2: 97%  ?Weight: 174 lb (78.9 kg)  ?Height: _0  (1.6 m)  ? ? ? ? ?

## 2021-07-22 NOTE — Progress Notes (Signed)
?Radiation Oncology         [phone] ?________________________________ ? ?Initial Outpatient Consultation ? ?Name: Carmen [name] MRN: [id number]  ?Date: 07/23/2021  DOB: 09-11-1940 ? ?JJ:OACZYS, Cletus Gash, MD  Volanda Napoleon, MD  ? ?REFERRING PHYSICIAN: Volanda Napoleon, MD ? ?DIAGNOSIS: The primary encounter diagnosis was Malignant neoplasm of lower-outer quadrant of left breast of female, estrogen receptor negative ([place]). A diagnosis of Stage I breast cancer, left ([place]) was also pertinent to this visit. ? ?S/p lumpectomy and adjuvant chemotherapy: Stage IIB (cT2, cN0, cM0) Left Breast LOQ, Invasive Ductal Carcinoma, ER- / PR- / Her2-, Grade 3 ? ?HISTORY OF PRESENT ILLNESS::Carmen [name] is a 81 y.o. female who is seen as a courtesy of Dr. Marin Olp for an opinion concerning radiation therapy as part of management for her diagnosed left breast cancer. (The patient has also been followed by Dr. Marin Olp for many years in regards to her history of hemochromatosis).  ? ?The patient presented for a routine screening mammogram on 11/13/20 which showed several abnormalities in the left breast, including: an 1.2 cm asymmetry at a posterior depth, a 1.5 cm mass at the 7 o'clock posterior depth, and a possible 0.6 cm mass in the retroareolar left breast.  ? ?Diagnostic left breast mammogram and left breast ultrasound at [facility] on 11/29/21 further revealed a 1.9 x 1.9 x 1.1 cm lobulated mass in the left breast at the 5 o'clock position middle depth, highly suggestive of malignancy, and suspicious focal cortical thickening the left axilla. So sonographic correlate was seen for the retroareolar abnormality seen on screening mammogram.  ? ?Biopsy of the 5 o'clock left breast mass on 12/12/20 revealed grade 3 invasive ductal carcinoma measuring 6 mm in the greatest linear extent, with microcalcifications. Prognostic indicators significant for: estrogen receptor negative; progesterone receptor negative;  Proliferation marker Ki67 at 40%; Her2 status negative; Grade 3.  Biopsy of a left axillary lymph node also performed was benign.  ? ?The patient opted to proceed with left lumpectomy with SLN biopsies on 01/14/21 under the care of Dr. Marlou Starks. Pathology from the procedure revealed: grade 3 invasive ductal carcinoma measuring 2.2 cm (as well as seborrheic keratosis of skin). All margins negative for carcinoma. Margin status to invasive disease of 0.2 cm from the inferior and medial margins. Nodal status of 5/5 left axillary sentinel lymph node excisions negative for carcinoma. Prognostic indicators significant for: estrogen receptor negative; progesterone receptor negative; Proliferation marker Ki67 at 40%; Her2 status negative; Grade 3.  A left chest wall lipoma was also biopsies and revealed benign adipose tissue consistent with lipoma.  ? ?The patient proceeded with adjuvant chemotherapy consisting of Adriamycin and Cytoxan x6 cycles from 02/27/2021 through 07/02/21 under the care of Dr. Marin Olp. The patient did well with her first cycle of chemotherapy other than hair loss. However, treatment had to be delayed following her first cycle due to development of a seroma in the UOQ of the left breast. This was aspirated and she completed 3 round of abx with slow but eventual resolution. She was able to resume with cycle 2 on systemic treatment on 04/10/21. The patient tolerated the remainder of her cycles quite well other than reports of constipation, nausea, and bony pain (likely from neupogen). ? ? ?PREVIOUS RADIATION THERAPY: No ? ?PAST MEDICAL HISTORY:  ?Past Medical History:  ?Diagnosis Date  ? Arthritis   ? Asymptomatic PVCs   ? Bilateral shoulder pain   ? CAD (coronary artery disease)   ? Cancer (  [place]) 11/2015  ? [id number]  right upper arm  ? Chronic renal insufficiency, stage 3 (moderate) (H[facility])   ? Diabetes mellitus without complication ([place])   ? Diverticulosis   ? Endometrial polyp   ? External hemorrhoids   ?  Goals of care, counseling/discussion 02/07/2021  ? Gout   ? Hemochromatosis, hereditary ([place]) 11/24/2012  ? History of anemia   ? History of duodenal ulcer 1990  ? Hyperlipidemia   ? Hypertension   ? Internal hemorrhoids   ? Jaundice   ? age 44 or 33  ? Myocardial infarction [facility]) 2007  ? Neuromuscular disorder ([place])   ? peripheral neuropathy  ? PMB (postmenopausal bleeding)   ? PONV (postoperative nausea and vomiting)   ? Prolapse of female pelvic organs   ? uses pessary  ? Seizures ([place])   ? had one at Dr. Antonieta Pert office after getting blood drawn  ? Stage I breast cancer, left ([place]) 12/16/2020  ? Tick bite 08/12/2017  ? had 3 bites  ? Type 2 diabetes mellitus with hyperglycemia, without long-term current use of insulin ([place]) 01/27/2019  ? Vitamin D deficiency   ? ? ?PAST SURGICAL HISTORY: ?Past Surgical History:  ?Procedure Laterality Date  ? BREAST LUMPECTOMY WITH RADIOACTIVE SEED AND SENTINEL LYMPH NODE BIOPSY Left 01/14/2021  ? Procedure: LEFT BREAST LUMPECTOMY WITH RADIOACTIVE SEED AND SENTINEL LYMPH NODE BIOPSY;  Surgeon: Jovita Kussmaul, MD;  Location: [place];  Service: General;  Laterality: Left;  ? BREAST SURGERY    ? left breast lump--benign  ? CARDIAC CATHETERIZATION  06/03/2005  ? COLONOSCOPY  11/28/2001  ? CORONARY ARTERY BYPASS GRAFT  2007  ? x2 Dr. Roxan Hockey, LIMA to LAD, SVG to PDA  ? DILATATION & CURETTAGE/HYSTEROSCOPY WITH MYOSURE N/A 09/28/2017  ? Procedure: Progress;  Surgeon: Molli Posey, MD;  [facility];  Service: Gynecology;  Laterality: N/A;  ? LIPOMA EXCISION    ? back  ? LIPOMA EXCISION Left 01/14/2021  ? Procedure: EXCISION LEFT CHEST WALL LIPOMA;  Surgeon: Jovita Kussmaul, MD;  Location: [place];  Service: General;  Laterality: Left;  ? PORTACATH PLACEMENT Right 01/14/2021  ? Procedure: INSERTION PORT-A-CATH;  Surgeon: Jovita Kussmaul, MD;  Location: [place];  Service:  General;  Laterality: Right;  ? UPPER GI ENDOSCOPY  01/20/1989  ? ? ?FAMILY HISTORY:  ?Family History  ?Problem Relation Age of Onset  ? Stroke Mother   ? Hypertension Mother   ? Neuropathy Mother   ? Stroke Father   ? Hypertension Father   ? Diabetes Father   ? Cancer Sister 34  ?     sarcoma  ? Arthritis Sister   ? Arthritis Sister   ? Lupus Sister   ? Hemachromatosis Sister   ? Arthritis Sister   ? Hemachromatosis Sister   ? Hemachromatosis Sister   ? Diverticulitis Sister   ? Diabetes Brother   ? Diabetes Brother   ? Diabetes Brother   ? Hemachromatosis Brother   ? Lung cancer Brother   ? Liver cancer Maternal Grandmother 26  ? Melanoma Daughter 66  ? ? ?SOCIAL HISTORY:  ?Social History  ? ?Tobacco Use  ? Smoking status: Never  ? Smokeless tobacco: Never  ? Tobacco comments:  ?  never used tobacco  ?Vaping Use  ? Vaping Use: Never used  ?Substance Use Topics  ? Alcohol use: Yes  ?  Alcohol/week: 0.0 standard drinks  ?  Comment: rare wine  ? Drug use: No  ? ? ?ALLERGIES:  ?Allergies  ?Allergen Reactions  ? Zofran [Ondansetron Hcl] Other (See Comments)  ?  Severe headache and abdominal pain  ? Cymbalta [Duloxetine Hcl] Other (See Comments)  ?  Profuse sweating  ? Wilder Glade [Dapagliflozin] Other (See Comments)  ?  Dizziness  ? Januvia [Sitagliptin] Swelling  ? Meloxicam Nausea And Vomiting  ? Shellfish Allergy Other (See Comments)  ?  Gout  ? Trulicity [Dulaglutide] Nausea Only  ? Crestor [Rosuvastatin] Other (See Comments)  ?  Muscle aches on 5 mg daily and 10 mg twice weekly  ? Diclofenac Diarrhea  ? Lipitor [Atorvastatin] Other (See Comments)  ?  Muscle aches  ? Livalo [Pitavastatin] Other (See Comments)  ?   ?  ? Sulfa Antibiotics Rash  ? Tape Other (See Comments)  ?  Causes skin irritation  ? Zetia [Ezetimibe] Other (See Comments)  ?  Muscle aches  ? Zocor [Simvastatin] Other (See Comments)  ?  Muscle aches   ? ? ?MEDICATIONS:  ?Current Outpatient Medications  ?Medication Sig Dispense Refill  ? acyclovir  (ZOVIRAX) 400 MG tablet Take 1 tablet (400 mg total) by mouth 3 (three) times daily. 90 tablet 1  ? allopurinol (ZYLOPRIM) 100 MG tablet TAKE (1) TABLET TWICE A DAY. 180 tablet 0  ? antiseptic oral rinse (BIOTENE)

## 2021-07-23 ENCOUNTER — Ambulatory Visit
Admission: RE | Admit: 2021-07-23 | Discharge: 2021-07-23 | Disposition: A | Discharge status: Home / Self Care | Payer: Medicare Other | Source: Ambulatory Visit | Attending: Radiation Oncology | Admitting: Radiation Oncology

## 2021-07-23 ENCOUNTER — Encounter: Payer: Self-pay | Admitting: Radiation Oncology

## 2021-07-23 ENCOUNTER — Other Ambulatory Visit: Payer: Self-pay

## 2021-07-23 VITALS — BP 158/84 | HR 99 | Temp 97.6°F | Resp 18 | Ht 63.0 in | Wt 174.0 lb

## 2021-07-23 DIAGNOSIS — R110 Nausea: Secondary | ICD-10-CM | POA: Diagnosis not present

## 2021-07-23 DIAGNOSIS — Z800 Family history of malignant neoplasm of digestive organs: Secondary | ICD-10-CM | POA: Diagnosis not present

## 2021-07-23 DIAGNOSIS — I2510 Atherosclerotic heart disease of native coronary artery without angina pectoris: Secondary | ICD-10-CM | POA: Diagnosis not present

## 2021-07-23 DIAGNOSIS — I129 Hypertensive chronic kidney disease with stage 1 through stage 4 chronic kidney disease, or unspecified chronic kidney disease: Secondary | ICD-10-CM | POA: Insufficient documentation

## 2021-07-23 DIAGNOSIS — C50912 Malignant neoplasm of unspecified site of left female breast: Secondary | ICD-10-CM

## 2021-07-23 DIAGNOSIS — E785 Hyperlipidemia, unspecified: Secondary | ICD-10-CM | POA: Diagnosis not present

## 2021-07-23 DIAGNOSIS — I252 Old myocardial infarction: Secondary | ICD-10-CM | POA: Diagnosis not present

## 2021-07-23 DIAGNOSIS — Z794 Long term (current) use of insulin: Secondary | ICD-10-CM | POA: Insufficient documentation

## 2021-07-23 DIAGNOSIS — Z7952 Long term (current) use of systemic steroids: Secondary | ICD-10-CM | POA: Insufficient documentation

## 2021-07-23 DIAGNOSIS — E1122 Type 2 diabetes mellitus with diabetic chronic kidney disease: Secondary | ICD-10-CM | POA: Diagnosis not present

## 2021-07-23 DIAGNOSIS — Z171 Estrogen receptor negative status [ER-]: Secondary | ICD-10-CM | POA: Diagnosis not present

## 2021-07-23 DIAGNOSIS — Z7982 Long term (current) use of aspirin: Secondary | ICD-10-CM | POA: Insufficient documentation

## 2021-07-23 DIAGNOSIS — C50512 Malignant neoplasm of lower-outer quadrant of left female breast: Secondary | ICD-10-CM | POA: Diagnosis not present

## 2021-07-23 DIAGNOSIS — Z7984 Long term (current) use of oral hypoglycemic drugs: Secondary | ICD-10-CM | POA: Diagnosis not present

## 2021-07-23 DIAGNOSIS — Z79899 Other long term (current) drug therapy: Secondary | ICD-10-CM | POA: Insufficient documentation

## 2021-07-23 DIAGNOSIS — K5900 Constipation, unspecified: Secondary | ICD-10-CM | POA: Diagnosis not present

## 2021-07-23 DIAGNOSIS — E1142 Type 2 diabetes mellitus with diabetic polyneuropathy: Secondary | ICD-10-CM | POA: Insufficient documentation

## 2021-07-23 DIAGNOSIS — Z801 Family history of malignant neoplasm of trachea, bronchus and lung: Secondary | ICD-10-CM | POA: Diagnosis not present

## 2021-07-23 DIAGNOSIS — Z923 Personal history of irradiation: Secondary | ICD-10-CM | POA: Diagnosis not present

## 2021-07-23 DIAGNOSIS — N183 Chronic kidney disease, stage 3 unspecified: Secondary | ICD-10-CM | POA: Diagnosis not present

## 2021-07-23 DIAGNOSIS — Z8719 Personal history of other diseases of the digestive system: Secondary | ICD-10-CM | POA: Insufficient documentation

## 2021-07-23 NOTE — Progress Notes (Signed)
See MD note for nursing evaluation. °

## 2021-07-24 ENCOUNTER — Encounter: Payer: Self-pay | Admitting: Internal Medicine

## 2021-07-24 ENCOUNTER — Ambulatory Visit (INDEPENDENT_AMBULATORY_CARE_PROVIDER_SITE_OTHER): Payer: Medicare Other | Admitting: Internal Medicine

## 2021-07-24 VITALS — BP 110/74 | HR 99 | Ht 63.0 in | Wt 175.0 lb

## 2021-07-24 DIAGNOSIS — E1122 Type 2 diabetes mellitus with diabetic chronic kidney disease: Secondary | ICD-10-CM | POA: Diagnosis not present

## 2021-07-24 DIAGNOSIS — E1142 Type 2 diabetes mellitus with diabetic polyneuropathy: Secondary | ICD-10-CM | POA: Diagnosis not present

## 2021-07-24 DIAGNOSIS — N1832 Chronic kidney disease, stage 3b: Secondary | ICD-10-CM

## 2021-07-24 DIAGNOSIS — R739 Hyperglycemia, unspecified: Secondary | ICD-10-CM

## 2021-07-24 LAB — POCT GLYCOSYLATED HEMOGLOBIN (HGB A1C): Hemoglobin A1C: 6.7 % — AB (ref 4.0–5.6)

## 2021-07-24 LAB — POCT GLUCOSE (DEVICE FOR HOME USE): Glucose Fasting, POC: 182 mg/dL — AB (ref 70–99)

## 2021-07-24 MED ORDER — RYBELSUS 7 MG PO TABS: 1.0000 | ORAL_TABLET | Freq: Every day | ORAL | 3 refills | Status: DC

## 2021-07-24 MED ORDER — GLIPIZIDE 10 MG PO TABS: ORAL_TABLET | ORAL | 3 refills | Status: DC

## 2021-07-24 NOTE — Progress Notes (Signed)
? ?Name: Carmen [name]  ?Age/ Sex: 81 y.o., female   ?MRN/ DOB: [id number], 03/01/1941    ? ?PCP: Claretta Fraise, MD   ?Reason for Endocrinology Evaluation: Type 2 Diabetes Mellitus  ?Initial Endocrine Consultative Visit: 09/14/2018  ? ? ?PATIENT IDENTIFIER: [name] is a 81 y.o. female with a past medical history of HTN, PVC, neuromuscular disorder, hemochromatosis and CAD (S/P CABG) . The patient has followed with Endocrinology clinic since 09/14/2018 for consultative assistance with management of her diabetes. ? ?DIABETIC HISTORY:  ?Carmen [name] was diagnosed with T2DM in 2016. Has tried oral glycemic agents in the past,Januvia- swelling , Trulicity - nausea , metformin - elevated LFT's . Her hemoglobin A1c has ranged from 6.6% in 2016, peaking at 8.6% in 2020. ? ?Wilder Glade stopped 10/2018 due to vertigo   ?Rybelsus started 2/21 ? ?SUBJECTIVE:  ? ?During the last visit (12/26/2021): A1c was 6.5%. We continued Glipizide and  Rybelsus  ? ? ? ?Today (07/24/2021): Carmen [name] is here for a follow up on diabetes management.  She checks her blood sugars 0 times in the past few weeks while visiting her daughter . The patient has not had hypoglycemic episodes since the last clinic visit.  ? ? ? ?She has been diagnosed with left breast ca ( invasive breast ca), She is S/P breast sx 01/14/2022. She finished chemo 07/02/2021 , planning radiation  Monday through Friday for 4 weeks  ? ?She has been nauseous  ?She has been having tingling or hands and feet ? ? ?HOME DIABETES REGIMEN:  ?Glipizide 10 mg 2 tabs before breakfast and 1 tablet before supper  ?Rybelsus 7 mg daily  ? ? ? ?METER DOWNLOAD SUMMARY: did not bring  ? ? ?DIABETIC COMPLICATIONS: ?Microvascular complications:  ?CKD III, neuropathy  ?Denies: retinopathy  ?Last eye exam: Completed 11/2020 ?  ?Macrovascular complications:  ?CAD (S/P CABG) ?Denies: PVD, CVA ? ? ? ? ?HISTORY:  ?Past Medical History:  ?Past Medical History:  ?Diagnosis Date  ? Arthritis    ? Asymptomatic PVCs   ? Bilateral shoulder pain   ? CAD (coronary artery disease)   ? Cancer ([place]) 11/2015  ? melanomax4  right upper arm  ? Chronic renal insufficiency, stage 3 (moderate) (HCC)   ? Diabetes mellitus without complication ([place])   ? Diverticulosis   ? Endometrial polyp   ? External hemorrhoids   ? Goals of care, counseling/discussion 02/07/2021  ? Gout   ? Hemochromatosis, hereditary ([place]) 11/24/2012  ? History of anemia   ? History of duodenal ulcer 1990  ? Hyperlipidemia   ? Hypertension   ? Internal hemorrhoids   ? Jaundice   ? age 65 or 89  ? Myocardial infarction [facility]) 2007  ? Neuromuscular disorder ([place])   ? peripheral neuropathy  ? PMB (postmenopausal bleeding)   ? PONV (postoperative nausea and vomiting)   ? Prolapse of female pelvic organs   ? uses pessary  ? Seizures ([place])   ? had one at Dr. Antonieta Pert office after getting blood drawn  ? Stage I breast cancer, left ([place]) 12/16/2020  ? Tick bite 08/12/2017  ? had 3 bites  ? Type 2 diabetes mellitus with hyperglycemia, without long-term current use of insulin ([place]) 01/27/2019  ? Vitamin D deficiency   ? ?Past Surgical History:  ?Past Surgical History:  ?Procedure Laterality Date  ? BREAST LUMPECTOMY WITH RADIOACTIVE SEED AND SENTINEL LYMPH NODE BIOPSY Left 01/14/2021  ? Procedure: LEFT BREAST LUMPECTOMY WITH RADIOACTIVE SEED AND SENTINEL  LYMPH NODE BIOPSY;  Surgeon: Jovita Kussmaul, MD;  Location: [facility];  Service: General;  Laterality: Left;  ? BREAST SURGERY    ? left breast lump--benign  ? CARDIAC CATHETERIZATION  06/03/2005  ? COLONOSCOPY  11/28/2001  ? CORONARY ARTERY BYPASS GRAFT  2007  ? x2 Dr. Roxan Hockey, LIMA to LAD, SVG to PDA  ? DILATATION & CURETTAGE/HYSTEROSCOPY WITH MYOSURE N/A 09/28/2017  ? Procedure: [place];  Surgeon: Molli Posey, MD;  Location: [facility];  Service: Gynecology;  Laterality: N/A;  ? LIPOMA EXCISION    ? back  ? LIPOMA EXCISION Left  01/14/2021  ? Procedure: EXCISION LEFT CHEST WALL LIPOMA;  Surgeon: Jovita Kussmaul, MD;  Location: [facility];  Service: General;  Laterality: Left;  ? PORTACATH PLACEMENT Right 01/14/2021  ? Procedure: INSERTION PORT-A-CATH;  Surgeon: Jovita Kussmaul, MD;  Location: [facility];  Service: General;  Laterality: Right;  ? UPPER GI ENDOSCOPY  01/20/1989  ? ?Social History:  reports that she has never smoked. She has never used smokeless tobacco. She reports current alcohol use. She reports that she does not use drugs. ?Family History:  ?Family History  ?Problem Relation Age of Onset  ? Stroke Mother   ? Hypertension Mother   ? Neuropathy Mother   ? Stroke Father   ? Hypertension Father   ? Diabetes Father   ? Cancer Sister 47  ?     sarcoma  ? Arthritis Sister   ? Arthritis Sister   ? Lupus Sister   ? Hemachromatosis Sister   ? Arthritis Sister   ? Hemachromatosis Sister   ? Hemachromatosis Sister   ? Diverticulitis Sister   ? Diabetes Brother   ? Diabetes Brother   ? Diabetes Brother   ? Hemachromatosis Brother   ? Lung cancer Brother   ? Liver cancer Maternal Grandmother 88  ? Melanoma Daughter 15  ? ? ? ?HOME MEDICATIONS: ?Allergies as of 07/24/2021   ? ?   Reactions  ? Zofran [ondansetron Hcl] Other (See Comments)  ? Severe headache and abdominal pain  ? Cymbalta [duloxetine Hcl] Other (See Comments)  ? Profuse sweating  ? Wilder Glade [dapagliflozin] Other (See Comments)  ? Dizziness  ? Januvia [sitagliptin] Swelling  ? Meloxicam Nausea And Vomiting  ? Shellfish Allergy Other (See Comments)  ? Gout  ? Trulicity [dulaglutide] Nausea Only  ? Crestor [rosuvastatin] Other (See Comments)  ? Muscle aches on 5 mg daily and 10 mg twice weekly  ? Diclofenac Diarrhea  ? Lipitor [atorvastatin] Other (See Comments)  ? Muscle aches  ? Livalo [pitavastatin] Other (See Comments)  ?   ? Sulfa Antibiotics Rash  ? Tape Other (See Comments)  ? Causes skin irritation  ? Zetia [ezetimibe] Other (See Comments)  ?  Muscle aches  ? Zocor [simvastatin] Other (See Comments)  ? Muscle aches   ? ?  ? ?  ?Medication List  ?  ? ?  ? Accurate as of Jul 24, 2021  3:11 PM. If you have any questions, ask your nurse or doctor.  ?  ?  ? ?  ? ?Accu-Chek [employer] Kit ?Use to check Blood Sugars ?  ?acyclovir 400 MG tablet ?Commonly known as: ZOVIRAX ?Take 1 tablet (400 mg total) by mouth 3 (three) times daily. ?  ?allopurinol 100 MG tablet ?Commonly known as: ZYLOPRIM ?TAKE (1) TABLET TWICE A DAY. ?  ?antiseptic oral rinse Liqd ?15 mLs  by Mouth Rinse route as needed for dry mouth. ?  ?aspirin 81 MG tablet ?Take 81 mg by mouth daily. ?  ?colchicine 0.6 MG tablet ?TAKE (1) TABLET DAILY AS NEEDED. ?  ?dexamethasone 4 MG tablet ?Commonly known as: DECADRON ?Take 2 tablets (8 mg total) by mouth daily. Take daily for 3 days after chemo. Take with food. ?  ?fluticasone 50 MCG/ACT nasal spray ?Commonly known as: FLONASE ?SPRAY 1 SPRAY IN EACH NOSTRIL ONCE DAILY. ?  ?furosemide 20 MG tablet ?Commonly known as: LASIX ?Take 1 tablet (20 mg total) by mouth daily. Must be seen before any further refills. ?  ?glipiZIDE 10 MG tablet ?Commonly known as: GLUCOTROL ?Take 2 tablets (20 mg total) by mouth daily before breakfast AND 1 tablet (10 mg total) daily before supper. ?  ?glucose blood test strip ?Commonly known as: ONE TOUCH ULTRA TEST ?CHECK BLOOD SUGAR 2 TIMES A DAY ?  ?HYDROcodone-acetaminophen 5-325 MG tablet ?Commonly known as: NORCO/VICODIN ?Take 1 tablet by mouth every 6 (six) hours as needed for moderate pain or severe pain. ?  ?icosapent Ethyl 1 g capsule ?Commonly known as: VASCEPA ?TAKE (2) CAPSULES TWICE DAILY. ?  ?insulin lispro 100 UNIT/ML KwikPen ?Commonly known as: HumaLOG KwikPen ?Max Daily 25 units ?  ?Insulin Pen Needle 32G X 4 MM Misc ?1 Device by Does not apply route 3 (three) times daily. ?  ?lidocaine-prilocaine cream ?Commonly known as: EMLA ?Apply 1 application topically as needed. ?  ?magic mouthwash w/lidocaine Soln ?Take  5 mLs by mouth 3 (three) times daily as needed for mouth pain. benadryl  525 mg, hydrocortisone 60 mg and nystatin 0.6 mg. ?  ?meclizine 12.5 MG tablet ?Commonly known as: ANTIVERT ?Take 1 tablet (12.5

## 2021-07-24 NOTE — Patient Instructions (Signed)
-   Continue Glipizide 10 mg, 2 tablets before breakfast and 1 tablet before supper   ?- Continue  Rybelsus 7 mg daily with Breakfast  ? ? ? ? ?- HOW TO TREAT LOW BLOOD SUGARS (Blood sugar LESS THAN 70 MG/DL) ?Please follow the RULE OF 15 for the treatment of hypoglycemia treatment (when your (blood sugars are less than 70 mg/dL)  ? ?STEP 1: Take 15 grams of carbohydrates when your blood sugar is low, which includes:  ?3-4 GLUCOSE TABS  OR ?3-4 OZ OF JUICE OR REGULAR SODA OR ?ONE TUBE OF GLUCOSE GEL   ? ?STEP 2: RECHECK blood sugar in 15 MINUTES ?STEP 3: If your blood sugar is still low at the 15 minute recheck --> then, go back to STEP 1 and treat AGAIN with another 15 grams of carbohydrates. ? ?

## 2021-07-28 ENCOUNTER — Ambulatory Visit
Admission: RE | Admit: 2021-07-28 | Discharge: 2021-07-28 | Disposition: A | Discharge status: Home / Self Care | Payer: Medicare Other | Source: Ambulatory Visit | Attending: Radiation Oncology | Admitting: Radiation Oncology

## 2021-07-28 ENCOUNTER — Other Ambulatory Visit: Payer: Self-pay

## 2021-07-28 DIAGNOSIS — C50512 Malignant neoplasm of lower-outer quadrant of left female breast: Secondary | ICD-10-CM | POA: Diagnosis not present

## 2021-07-28 DIAGNOSIS — Z171 Estrogen receptor negative status [ER-]: Secondary | ICD-10-CM | POA: Insufficient documentation

## 2021-07-28 DIAGNOSIS — Z510 Encounter for antineoplastic radiation therapy: Secondary | ICD-10-CM | POA: Diagnosis not present

## 2021-07-28 DIAGNOSIS — C50912 Malignant neoplasm of unspecified site of left female breast: Secondary | ICD-10-CM

## 2021-08-05 ENCOUNTER — Encounter: Payer: Self-pay | Admitting: *Deleted

## 2021-08-05 DIAGNOSIS — C50512 Malignant neoplasm of lower-outer quadrant of left female breast: Secondary | ICD-10-CM | POA: Diagnosis not present

## 2021-08-05 DIAGNOSIS — Z171 Estrogen receptor negative status [ER-]: Secondary | ICD-10-CM | POA: Diagnosis not present

## 2021-08-05 DIAGNOSIS — Z510 Encounter for antineoplastic radiation therapy: Secondary | ICD-10-CM | POA: Diagnosis not present

## 2021-08-05 NOTE — Progress Notes (Signed)
Patient asks if she can have a lipid panel drawn with her labs on Thursday for her cardiologist. Her appointment isn't until 11:30a. Explained to patient that she would have to fast for 8hours before the test and she said that would be too difficult. She can't wait that long to eat. She will follow up with her cardiologist.  ? ?Oncology Nurse Navigator Documentation ? ? ?  08/05/2021  ?  3:15 PM  ?Oncology Nurse Navigator Flowsheets  ?Navigator Follow Up Date: 08/07/2021  ?Navigator Follow Up Reason: Follow-up Appointment  ?Navigator Location [facility]  ?Navigator Encounter Type Telephone  ?Telephone Incoming Call  ?Patient Visit Type MedOnc  ?Treatment Phase Follow-up  ?Barriers/Navigation Needs Education  ?Education Other  ?Interventions Education;Psycho-Social Support  ?Acuity Level 2-Minimal Needs (1-2 Barriers Identified)  ?Education Method Verbal  ?Support Groups/Services Friends and Family  ?Time Spent with Patient 15  ?  ?

## 2021-08-06 ENCOUNTER — Other Ambulatory Visit: Payer: Self-pay

## 2021-08-06 ENCOUNTER — Ambulatory Visit
Admission: RE | Admit: 2021-08-06 | Discharge: 2021-08-06 | Disposition: A | Discharge status: Home / Self Care | Payer: Medicare Other | Source: Ambulatory Visit | Attending: Radiation Oncology | Admitting: Radiation Oncology

## 2021-08-06 DIAGNOSIS — Z510 Encounter for antineoplastic radiation therapy: Secondary | ICD-10-CM | POA: Diagnosis not present

## 2021-08-06 DIAGNOSIS — Z171 Estrogen receptor negative status [ER-]: Secondary | ICD-10-CM

## 2021-08-06 DIAGNOSIS — C50912 Malignant neoplasm of unspecified site of left female breast: Secondary | ICD-10-CM

## 2021-08-06 DIAGNOSIS — C50512 Malignant neoplasm of lower-outer quadrant of left female breast: Secondary | ICD-10-CM | POA: Diagnosis not present

## 2021-08-06 LAB — RAD ONC ARIA SESSION SUMMARY
Course Elapsed Days: 0
Plan Fractions Treated to Date: 1
Plan Prescribed Dose Per Fraction: 2.67 Gy
Plan Total Fractions Prescribed: 15
Plan Total Prescribed Dose: 40.05 Gy
Reference Point Dosage Given to Date: 2.67 Gy
Reference Point Session Dosage Given: 2.67 Gy
Session Number: 1

## 2021-08-07 ENCOUNTER — Other Ambulatory Visit: Payer: Self-pay

## 2021-08-07 ENCOUNTER — Encounter: Payer: Self-pay | Admitting: Hematology & Oncology

## 2021-08-07 ENCOUNTER — Inpatient Hospital Stay: Payer: Medicare Other | Attending: Hematology & Oncology

## 2021-08-07 ENCOUNTER — Inpatient Hospital Stay: Payer: Medicare Other

## 2021-08-07 ENCOUNTER — Ambulatory Visit
Admission: RE | Admit: 2021-08-07 | Discharge: 2021-08-07 | Disposition: A | Discharge status: Home / Self Care | Payer: Medicare Other | Source: Ambulatory Visit | Attending: Radiation Oncology | Admitting: Radiation Oncology

## 2021-08-07 ENCOUNTER — Inpatient Hospital Stay (HOSPITAL_BASED_OUTPATIENT_CLINIC_OR_DEPARTMENT_OTHER): Payer: Medicare Other | Admitting: Hematology & Oncology

## 2021-08-07 VITALS — BP 127/69 | HR 72 | Temp 97.9°F | Resp 18 | Wt 169.0 lb

## 2021-08-07 DIAGNOSIS — C50912 Malignant neoplasm of unspecified site of left female breast: Secondary | ICD-10-CM

## 2021-08-07 DIAGNOSIS — D0512 Intraductal carcinoma in situ of left breast: Secondary | ICD-10-CM | POA: Insufficient documentation

## 2021-08-07 DIAGNOSIS — Z171 Estrogen receptor negative status [ER-]: Secondary | ICD-10-CM | POA: Diagnosis not present

## 2021-08-07 DIAGNOSIS — Z95828 Presence of other vascular implants and grafts: Secondary | ICD-10-CM

## 2021-08-07 DIAGNOSIS — Z510 Encounter for antineoplastic radiation therapy: Secondary | ICD-10-CM | POA: Diagnosis not present

## 2021-08-07 DIAGNOSIS — C50512 Malignant neoplasm of lower-outer quadrant of left female breast: Secondary | ICD-10-CM | POA: Diagnosis not present

## 2021-08-07 LAB — CBC WITH DIFFERENTIAL (CANCER CENTER ONLY)
Abs Immature Granulocytes: 0.06 10*3/uL (ref 0.00–0.07)
Basophils Absolute: 0 10*3/uL (ref 0.0–0.1)
Basophils Relative: 1 %
Eosinophils Absolute: 0.2 10*3/uL (ref 0.0–0.5)
Eosinophils Relative: 4 %
HCT: 33.9 % — ABNORMAL LOW (ref 36.0–46.0)
Hemoglobin: 11.5 g/dL — ABNORMAL LOW (ref 12.0–15.0)
Immature Granulocytes: 1 %
Lymphocytes Relative: 14 %
Lymphs Abs: 0.9 10*3/uL (ref 0.7–4.0)
MCH: 33.8 pg (ref 26.0–34.0)
MCHC: 33.9 g/dL (ref 30.0–36.0)
MCV: 99.7 fL (ref 80.0–100.0)
Monocytes Absolute: 0.6 10*3/uL (ref 0.1–1.0)
Monocytes Relative: 9 %
Neutro Abs: 4.7 10*3/uL (ref 1.7–7.7)
Neutrophils Relative %: 71 %
Platelet Count: 104 10*3/uL — ABNORMAL LOW (ref 150–400)
RBC: 3.4 MIL/uL — ABNORMAL LOW (ref 3.87–5.11)
RDW: 14.4 % (ref 11.5–15.5)
WBC Count: 6.5 10*3/uL (ref 4.0–10.5)
nRBC: 0 % (ref 0.0–0.2)

## 2021-08-07 LAB — RAD ONC ARIA SESSION SUMMARY
Course Elapsed Days: 1
Plan Fractions Treated to Date: 2
Plan Prescribed Dose Per Fraction: 2.67 Gy
Plan Total Fractions Prescribed: 15
Plan Total Prescribed Dose: 40.05 Gy
Reference Point Dosage Given to Date: 5.34 Gy
Reference Point Session Dosage Given: 2.67 Gy
Session Number: 2

## 2021-08-07 LAB — FERRITIN: Ferritin: 216 ng/mL (ref 11–307)

## 2021-08-07 LAB — CMP (CANCER CENTER ONLY)
ALT: 13 U/L (ref 0–44)
AST: 18 U/L (ref 15–41)
Albumin: 4.1 g/dL (ref 3.5–5.0)
Alkaline Phosphatase: 59 U/L (ref 38–126)
Anion gap: 7 (ref 5–15)
BUN: 20 mg/dL (ref 8–23)
CO2: 28 mmol/L (ref 22–32)
Calcium: 9.8 mg/dL (ref 8.9–10.3)
Chloride: 104 mmol/L (ref 98–111)
Creatinine: 0.95 mg/dL (ref 0.44–1.00)
GFR, Estimated: >60 mL/min (ref >60)
Glucose, Bld: 146 mg/dL — ABNORMAL HIGH (ref 70–99)
Potassium: 3.5 mmol/L (ref 3.5–5.1)
Sodium: 139 mmol/L (ref 135–145)
Total Bilirubin: 0.4 mg/dL (ref 0.3–1.2)
Total Protein: 6.4 g/dL — ABNORMAL LOW (ref 6.5–8.1)

## 2021-08-07 LAB — IRON AND IRON BINDING CAPACITY (CC-WL,HP ONLY)
Iron: 103 ug/dL (ref 28–170)
Saturation Ratios: 33 % — ABNORMAL HIGH (ref 10.4–31.8)
TIBC: 309 ug/dL (ref 250–450)
UIBC: 206 ug/dL (ref 148–442)

## 2021-08-07 MED ORDER — SODIUM CHLORIDE 0.9% FLUSH
10.0000 mL | Freq: Once | INTRAVENOUS | Status: AC
  Administered 2021-08-07: 10 mL via INTRAVENOUS

## 2021-08-07 MED ORDER — HEPARIN SOD (PORK) LOCK FLUSH 100 UNIT/ML IV SOLN
500.0000 [IU] | Freq: Once | INTRAVENOUS | Status: AC
  Administered 2021-08-07: 500 [IU] via INTRAVENOUS

## 2021-08-07 NOTE — Progress Notes (Signed)
Hematology and Oncology Follow Up Visit  Carmen [name] [id number] September 12, 1940 81 y.o. 08/07/2021   Principle Diagnosis:  Invasive ductal carcinoma of the left breast-stage IIA (T2N0M0) -- TRIPLE NEGATIVE -- s/p LEFT lobectomy on 01/14/2021 Hemochromatosis (double heterozygote for C282Y and S65C mutations).   Current Therapy:  Phlebotomy to maintain ferritin less than 100 Adriamycin/Cytoxan - started adjuvant therapy on 02/27/2021, s/p cycle #6/6 XRT to the LEFT breast -- started on 08/06/2021   Interim History:  [name] is here today for follow-up.  She actually is doing quite well.  She has she started radiation therapy.  She has had 1 treatment so far.  She really got through chemotherapy nicely.  I was very impressed with her resilience.  She still feels tired.  I think she will probably feel tired for another 4 months or so.  She has had no problems with the hemochromatosis.  When we last saw her, her ferritin was 263 with an iron saturation of 32%.  We have not had to phlebotomize her while she has been getting treatment.  She has had no change in bowel or bladder habits.  She has had no issues with nausea or vomiting.  She has had no rashes.  She has had little bit of leg swelling.  Overall, I would say performance status is probably ECOG 1.    Medications:  Allergies as of 08/07/2021       Reactions   Zofran [ondansetron Hcl] Other (See Comments)   Severe headache and abdominal pain   Cymbalta [duloxetine Hcl] Other (See Comments)   Profuse sweating   Farxiga [dapagliflozin] Other (See Comments)   Dizziness   Januvia [sitagliptin] Swelling   Meloxicam Nausea And Vomiting   Shellfish Allergy Other (See Comments)   Gout   Trulicity [dulaglutide] Nausea Only   Crestor [rosuvastatin] Other (See Comments)   Muscle aches on 5 mg daily and 10 mg twice weekly   Diclofenac Diarrhea   Lipitor [atorvastatin] Other (See Comments)   Muscle aches   Livalo  [pitavastatin] Other (See Comments)      Sulfa Antibiotics Rash   Tape Other (See Comments)   Causes skin irritation   Zetia [ezetimibe] Other (See Comments)   Muscle aches   Zocor [simvastatin] Other (See Comments)   Muscle aches         Medication List        Accurate as of Aug 07, 2021  1:01 PM. If you have any questions, ask your nurse or doctor.          Accu-Chek [employer] Kit Use to check Blood Sugars   acyclovir 400 MG tablet Commonly known as: ZOVIRAX Take 1 tablet (400 mg total) by mouth 3 (three) times daily.   allopurinol 100 MG tablet Commonly known as: ZYLOPRIM TAKE (1) TABLET TWICE A DAY.   antiseptic oral rinse Liqd 15 mLs by Mouth Rinse route as needed for dry mouth.   aspirin 81 MG tablet Take 81 mg by mouth daily.   colchicine 0.6 MG tablet TAKE (1) TABLET DAILY AS NEEDED.   dexamethasone 4 MG tablet Commonly known as: DECADRON Take 2 tablets (8 mg total) by mouth daily. Take daily for 3 days after chemo. Take with food.   fluticasone 50 MCG/ACT nasal spray Commonly known as: FLONASE SPRAY 1 SPRAY IN EACH NOSTRIL ONCE DAILY.   furosemide 20 MG tablet Commonly known as: LASIX Take 1 tablet (20 mg total) by mouth daily. Must be seen before any  further refills.   glipiZIDE 10 MG tablet Commonly known as: GLUCOTROL Take 2 tablets (20 mg total) by mouth daily before breakfast AND 1 tablet (10 mg total) daily before supper.   glucose blood test strip Commonly known as: ONE TOUCH ULTRA TEST CHECK BLOOD SUGAR 2 TIMES A DAY   HYDROcodone-acetaminophen 5-325 MG tablet Commonly known as: NORCO/VICODIN Take 1 tablet by mouth every 6 (six) hours as needed for moderate pain or severe pain.   icosapent Ethyl 1 g capsule Commonly known as: VASCEPA TAKE (2) CAPSULES TWICE DAILY.   insulin lispro 100 UNIT/ML KwikPen Commonly known as: HumaLOG KwikPen Max Daily 25 units   Insulin Pen Needle 32G X 4 MM Misc 1 Device by Does not apply  route 3 (three) times daily.   lidocaine-prilocaine cream Commonly known as: EMLA Apply 1 application topically as needed.   magic mouthwash w/lidocaine Soln Take 5 mLs by mouth 3 (three) times daily as needed for mouth pain. benadryl  525 mg, hydrocortisone 60 mg and nystatin 0.6 mg.   meclizine 12.5 MG tablet Commonly known as: ANTIVERT Take 1 tablet (12.5 mg total) by mouth 3 (three) times daily as needed for dizziness.   metoprolol tartrate 25 MG tablet Commonly known as: LOPRESSOR TAKE  (1)  TABLET TWICE A DAY.   mupirocin cream 2 % Commonly known as: Bactroban Apply 1 application topically 3 (three) times daily.   OLANZapine 10 MG tablet Commonly known as: ZYPREXA Take 1 tablet (10 mg total) by mouth at bedtime.   olmesartan 20 MG tablet Commonly known as: BENICAR Take 1 tablet (20 mg total) by mouth daily.   Praluent 150 MG/ML Soaj Generic drug: Alirocumab Inject 1 Dose into the skin every 14 (fourteen) days.   pregabalin 50 MG capsule Commonly known as: LYRICA TAKE ONE CAPSULE BY MOUTH TWICE DAILY   prochlorperazine 10 MG tablet Commonly known as: COMPAZINE Take 1 tablet (10 mg total) by mouth every 6 (six) hours as needed (Nausea or vomiting).   Restasis 0.05 % ophthalmic emulsion Generic drug: cycloSPORINE Place 2 drops into both eyes 2 (two) times daily as needed.   Rybelsus 7 MG Tabs Generic drug: Semaglutide Take 1 tablet by mouth daily before breakfast.   Vitamin D3 250 MCG (10000 UT) Tabs Take by mouth 3 (three) times a week.   Voltaren 1 % Gel Generic drug: diclofenac Sodium APPLY 4 GRAMS TO AFFECTED AREA(S) 4 TIMES A DAY AS NEEDED   diclofenac Sodium 1 % Gel Commonly known as: VOLTAREN APPLY 4 GRAMS TO AFFECTED AREA(S) 4 TIMES A DAY AS NEEDED        Allergies:  Allergies  Allergen Reactions   Zofran [Ondansetron Hcl] Other (See Comments)    Severe headache and abdominal pain   Cymbalta [Duloxetine Hcl] Other (See Comments)     Profuse sweating   Farxiga [Dapagliflozin] Other (See Comments)    Dizziness   Januvia [Sitagliptin] Swelling   Meloxicam Nausea And Vomiting   Shellfish Allergy Other (See Comments)    Gout   Trulicity [Dulaglutide] Nausea Only   Crestor [Rosuvastatin] Other (See Comments)    Muscle aches on 5 mg daily and 10 mg twice weekly   Diclofenac Diarrhea   Lipitor [Atorvastatin] Other (See Comments)    Muscle aches   Livalo [Pitavastatin] Other (See Comments)        Sulfa Antibiotics Rash   Tape Other (See Comments)    Causes skin irritation   Zetia [Ezetimibe] Other (See Comments)  Muscle aches   Zocor [Simvastatin] Other (See Comments)    Muscle aches     Past Medical History, Surgical history, Social history, and Family History were reviewed and updated.  Review of Systems: Review of Systems  Constitutional: Negative.   HENT:  Positive for sore throat.   Eyes: Negative.   Respiratory: Negative.    Cardiovascular: Negative.   Gastrointestinal: Negative.   Genitourinary: Negative.   Musculoskeletal: Negative.   Skin: Negative.   Neurological: Negative.   Endo/Heme/Allergies: Negative.   Psychiatric/Behavioral: Negative.      Physical Exam:  weight is 169 lb (76.7 kg). Her oral temperature is 97.9 F (36.6 C). Her blood pressure is 127/69 and her pulse is 72. Her respiration is 18 and oxygen saturation is 99%.   Wt Readings from Last 3 Encounters:  08/07/21 169 lb (76.7 kg)  07/24/21 175 lb (79.4 kg)  07/23/21 174 lb (78.9 kg)    Physical Exam Vitals reviewed.  HENT:     Head: Normocephalic and atraumatic.  Eyes:     Pupils: Pupils are equal, round, and reactive to light.  Cardiovascular:     Rate and Rhythm: Normal rate and regular rhythm.     Heart sounds: Normal heart sounds.  Pulmonary:     Effort: Pulmonary effort is normal.     Breath sounds: Normal breath sounds.  Abdominal:     General: Bowel sounds are normal.     Palpations: Abdomen is soft.   Musculoskeletal:        General: No tenderness or deformity. Normal range of motion.     Cervical back: Normal range of motion.  Lymphadenopathy:     Cervical: No cervical adenopathy.  Skin:    General: Skin is warm and dry.     Findings: No erythema or rash.  Neurological:     Mental Status: She is alert and oriented to person, place, and time.  Psychiatric:        Behavior: Behavior normal.        Thought Content: Thought content normal.        Judgment: Judgment normal.     Lab Results  Component Value Date   WBC 6.5 08/07/2021   HGB 11.5 (L) 08/07/2021   HCT 33.9 (L) 08/07/2021   MCV 99.7 08/07/2021   PLT 104 (L) 08/07/2021   Lab Results  Component Value Date   FERRITIN 263 07/02/2021   IRON 84 07/02/2021   TIBC 260 07/02/2021   UIBC 176 07/02/2021   IRONPCTSAT 32 (H) 07/02/2021   Lab Results  Component Value Date   RETICCTPCT 3.8 (H) 06/11/2021   RBC 3.40 (L) 08/07/2021   No results found for: KPAFRELGTCHN, LAMBDASER, KAPLAMBRATIO No results found for: IGGSERUM, IGA, IGMSERUM No results found for: Odetta Pink, SPEI   Chemistry      Component Value Date/Time   NA 139 08/07/2021 1123   NA 147 (H) 01/20/2021 1446   NA 147 (H) 01/22/2017 1451   NA 140 12/31/2015 1052   K 3.5 08/07/2021 1123   K 4.9 (H) 01/22/2017 1451   K 4.2 12/31/2015 1052   CL 104 08/07/2021 1123   CL 106 01/22/2017 1451   CO2 28 08/07/2021 1123   CO2 29 01/22/2017 1451   CO2 23 12/31/2015 1052   BUN 20 08/07/2021 1123   BUN 18 01/20/2021 1446   BUN 23 (H) 01/22/2017 1451   BUN 21.4 12/31/2015 1052   CREATININE 0.95 08/07/2021  1123   CREATININE 1.6 (H) 01/22/2017 1451   CREATININE 1.4 (H) 12/31/2015 1052      Component Value Date/Time   CALCIUM 9.8 08/07/2021 1123   CALCIUM 10.3 01/22/2017 1451   CALCIUM 9.7 12/31/2015 1052   ALKPHOS 59 08/07/2021 1123   ALKPHOS 65 01/22/2017 1451   ALKPHOS 87 12/31/2015 1052   AST 18  08/07/2021 1123   AST 31 12/31/2015 1052   ALT 13 08/07/2021 1123   ALT 25 01/22/2017 1451   ALT 36 12/31/2015 1052   BILITOT 0.4 08/07/2021 1123   BILITOT 0.42 12/31/2015 1052       Impression and Plan: Ms. Wilton is a very pleasant 81 yo caucasian female with stage IIA infiltrating ductal carcinoma of the left breast, triple negative.   She completed adjuvant chemotherapy.  She really did well with this.  She now is getting radiation treatments.  Again I think that he will probably another 3 to 4 months before she starts to feel a little bit better.  We will plan to get her back in about 2 months now.  We had to make sure that we focus now on the hemochromatosis.  We did make sure that her iron levels do not get too high on this.   Volanda Napoleon, MD 5/18/20231:01 PM

## 2021-08-07 NOTE — Patient Instructions (Signed)

## 2021-08-08 ENCOUNTER — Other Ambulatory Visit: Payer: Self-pay

## 2021-08-08 ENCOUNTER — Encounter: Payer: Self-pay | Admitting: *Deleted

## 2021-08-08 ENCOUNTER — Ambulatory Visit
Admission: RE | Admit: 2021-08-08 | Discharge: 2021-08-08 | Disposition: A | Discharge status: Home / Self Care | Payer: Medicare Other | Source: Ambulatory Visit | Attending: Radiation Oncology | Admitting: Radiation Oncology

## 2021-08-08 DIAGNOSIS — Z510 Encounter for antineoplastic radiation therapy: Secondary | ICD-10-CM | POA: Diagnosis not present

## 2021-08-08 DIAGNOSIS — Z171 Estrogen receptor negative status [ER-]: Secondary | ICD-10-CM | POA: Diagnosis not present

## 2021-08-08 DIAGNOSIS — C50512 Malignant neoplasm of lower-outer quadrant of left female breast: Secondary | ICD-10-CM | POA: Diagnosis not present

## 2021-08-08 LAB — RAD ONC ARIA SESSION SUMMARY
Course Elapsed Days: 2
Plan Fractions Treated to Date: 3
Plan Prescribed Dose Per Fraction: 2.67 Gy
Plan Total Fractions Prescribed: 15
Plan Total Prescribed Dose: 40.05 Gy
Reference Point Dosage Given to Date: 8.01 Gy
Reference Point Session Dosage Given: 2.67 Gy
Session Number: 3

## 2021-08-08 NOTE — Progress Notes (Signed)
Patient has started radiation therapy now that she has completed her chemotherapy.   Oncology Nurse Navigator Documentation     08/08/2021    9:00 AM  Oncology Nurse Navigator Flowsheets  Phase of Treatment Radiation  Radiation Actual Start Date: 08/06/2021  Radiation Expected End Date: 09/04/2021  Navigator Follow Up Date: 10/09/2021  Navigator Follow Up Reason: Follow-up Appointment  Navigator Location [facility]  Navigator Encounter Type Appt/Treatment Plan Review  Patient Visit Type MedOnc  Treatment Phase Active Tx  Barriers/Navigation Needs No Barriers At This Time  Interventions None Required  Acuity Level 1-No Barriers  Support Groups/Services Friends and Family  Time Spent with Patient 15

## 2021-08-11 ENCOUNTER — Encounter: Payer: Self-pay | Admitting: Family

## 2021-08-11 ENCOUNTER — Ambulatory Visit
Admission: RE | Admit: 2021-08-11 | Discharge: 2021-08-11 | Disposition: A | Discharge status: Home / Self Care | Payer: Medicare Other | Source: Ambulatory Visit | Attending: Radiation Oncology | Admitting: Radiation Oncology

## 2021-08-11 ENCOUNTER — Other Ambulatory Visit: Payer: Self-pay

## 2021-08-11 ENCOUNTER — Encounter: Payer: Self-pay | Admitting: Hematology & Oncology

## 2021-08-11 DIAGNOSIS — Z171 Estrogen receptor negative status [ER-]: Secondary | ICD-10-CM | POA: Diagnosis not present

## 2021-08-11 DIAGNOSIS — C50512 Malignant neoplasm of lower-outer quadrant of left female breast: Secondary | ICD-10-CM | POA: Diagnosis not present

## 2021-08-11 DIAGNOSIS — Z510 Encounter for antineoplastic radiation therapy: Secondary | ICD-10-CM | POA: Diagnosis not present

## 2021-08-11 LAB — RAD ONC ARIA SESSION SUMMARY
Course Elapsed Days: 5
Plan Fractions Treated to Date: 4
Plan Prescribed Dose Per Fraction: 2.67 Gy
Plan Total Fractions Prescribed: 15
Plan Total Prescribed Dose: 40.05 Gy
Reference Point Dosage Given to Date: 10.68 Gy
Reference Point Session Dosage Given: 2.67 Gy
Session Number: 4

## 2021-08-12 ENCOUNTER — Other Ambulatory Visit: Payer: Self-pay

## 2021-08-12 ENCOUNTER — Ambulatory Visit
Admission: RE | Admit: 2021-08-12 | Discharge: 2021-08-12 | Disposition: A | Discharge status: Home / Self Care | Payer: Medicare Other | Source: Ambulatory Visit | Attending: Radiation Oncology | Admitting: Radiation Oncology

## 2021-08-12 ENCOUNTER — Other Ambulatory Visit: Payer: Self-pay | Admitting: Family Medicine

## 2021-08-12 DIAGNOSIS — Z171 Estrogen receptor negative status [ER-]: Secondary | ICD-10-CM

## 2021-08-12 DIAGNOSIS — E1140 Type 2 diabetes mellitus with diabetic neuropathy, unspecified: Secondary | ICD-10-CM

## 2021-08-12 DIAGNOSIS — Z510 Encounter for antineoplastic radiation therapy: Secondary | ICD-10-CM | POA: Diagnosis not present

## 2021-08-12 DIAGNOSIS — C50512 Malignant neoplasm of lower-outer quadrant of left female breast: Secondary | ICD-10-CM | POA: Diagnosis not present

## 2021-08-12 DIAGNOSIS — E1142 Type 2 diabetes mellitus with diabetic polyneuropathy: Secondary | ICD-10-CM | POA: Diagnosis not present

## 2021-08-12 DIAGNOSIS — L84 Corns and callosities: Secondary | ICD-10-CM | POA: Diagnosis not present

## 2021-08-12 DIAGNOSIS — B351 Tinea unguium: Secondary | ICD-10-CM | POA: Diagnosis not present

## 2021-08-12 DIAGNOSIS — M79676 Pain in unspecified toe(s): Secondary | ICD-10-CM | POA: Diagnosis not present

## 2021-08-12 LAB — RAD ONC ARIA SESSION SUMMARY
Course Elapsed Days: 6
Plan Fractions Treated to Date: 5
Plan Prescribed Dose Per Fraction: 2.67 Gy
Plan Total Fractions Prescribed: 15
Plan Total Prescribed Dose: 40.05 Gy
Reference Point Dosage Given to Date: 13.35 Gy
Reference Point Session Dosage Given: 2.67 Gy
Session Number: 5

## 2021-08-12 MED ORDER — RADIAPLEXRX EX GEL: Freq: Once | CUTANEOUS | Status: AC

## 2021-08-12 MED ORDER — ALRA NON-METALLIC DEODORANT (RAD-ONC)
1.0000 "application " | Freq: Once | TOPICAL | Status: AC
  Administered 2021-08-12: 1 via TOPICAL

## 2021-08-12 NOTE — Progress Notes (Signed)
Pt here for patient teaching.    Pt given Radiation and You booklet, skin care instructions, Alra deodorant, and Radiaplex gel.    Reviewed areas of pertinence such as fatigue, hair loss in treatment field, skin changes, breast tenderness, and breast swelling .   Pt able to give teach back of to pat skin and use unscented/gentle soap,apply Radiaplex bid, avoid applying anything to skin within 4 hours of treatment, avoid wearing an under wire bra, and to use an electric razor if they must shave.   Pt verbalizes understanding of information given and will contact nursing with any questions or concerns.          

## 2021-08-13 ENCOUNTER — Other Ambulatory Visit: Payer: Self-pay

## 2021-08-13 ENCOUNTER — Ambulatory Visit
Admission: RE | Admit: 2021-08-13 | Discharge: 2021-08-13 | Disposition: A | Discharge status: Home / Self Care | Payer: Medicare Other | Source: Ambulatory Visit | Attending: Radiation Oncology | Admitting: Radiation Oncology

## 2021-08-13 DIAGNOSIS — Z510 Encounter for antineoplastic radiation therapy: Secondary | ICD-10-CM | POA: Diagnosis not present

## 2021-08-13 DIAGNOSIS — Z171 Estrogen receptor negative status [ER-]: Secondary | ICD-10-CM | POA: Diagnosis not present

## 2021-08-13 DIAGNOSIS — C50512 Malignant neoplasm of lower-outer quadrant of left female breast: Secondary | ICD-10-CM | POA: Diagnosis not present

## 2021-08-13 LAB — RAD ONC ARIA SESSION SUMMARY
Course Elapsed Days: 7
Plan Fractions Treated to Date: 6
Plan Prescribed Dose Per Fraction: 2.67 Gy
Plan Total Fractions Prescribed: 15
Plan Total Prescribed Dose: 40.05 Gy
Reference Point Dosage Given to Date: 16.02 Gy
Reference Point Session Dosage Given: 2.67 Gy
Session Number: 6

## 2021-08-14 ENCOUNTER — Ambulatory Visit
Admission: RE | Admit: 2021-08-14 | Discharge: 2021-08-14 | Disposition: A | Discharge status: Home / Self Care | Payer: Medicare Other | Source: Ambulatory Visit | Attending: Radiation Oncology | Admitting: Radiation Oncology

## 2021-08-14 ENCOUNTER — Ambulatory Visit (INDEPENDENT_AMBULATORY_CARE_PROVIDER_SITE_OTHER): Payer: Medicare Other | Admitting: Family Medicine

## 2021-08-14 ENCOUNTER — Encounter: Payer: Self-pay | Admitting: Family Medicine

## 2021-08-14 ENCOUNTER — Other Ambulatory Visit: Payer: Self-pay

## 2021-08-14 VITALS — BP 157/82 | HR 80 | Temp 97.5°F | Ht 63.0 in | Wt 172.6 lb

## 2021-08-14 DIAGNOSIS — E782 Mixed hyperlipidemia: Secondary | ICD-10-CM | POA: Diagnosis not present

## 2021-08-14 DIAGNOSIS — N183 Chronic kidney disease, stage 3 unspecified: Secondary | ICD-10-CM | POA: Diagnosis not present

## 2021-08-14 DIAGNOSIS — I152 Hypertension secondary to endocrine disorders: Secondary | ICD-10-CM | POA: Diagnosis not present

## 2021-08-14 DIAGNOSIS — Z171 Estrogen receptor negative status [ER-]: Secondary | ICD-10-CM | POA: Diagnosis not present

## 2021-08-14 DIAGNOSIS — E1140 Type 2 diabetes mellitus with diabetic neuropathy, unspecified: Secondary | ICD-10-CM | POA: Diagnosis not present

## 2021-08-14 DIAGNOSIS — E1159 Type 2 diabetes mellitus with other circulatory complications: Secondary | ICD-10-CM | POA: Diagnosis not present

## 2021-08-14 DIAGNOSIS — C50512 Malignant neoplasm of lower-outer quadrant of left female breast: Secondary | ICD-10-CM | POA: Diagnosis not present

## 2021-08-14 DIAGNOSIS — Z510 Encounter for antineoplastic radiation therapy: Secondary | ICD-10-CM | POA: Diagnosis not present

## 2021-08-14 LAB — RAD ONC ARIA SESSION SUMMARY
Course Elapsed Days: 8
Plan Fractions Treated to Date: 7
Plan Prescribed Dose Per Fraction: 2.67 Gy
Plan Total Fractions Prescribed: 15
Plan Total Prescribed Dose: 40.05 Gy
Reference Point Dosage Given to Date: 18.69 Gy
Reference Point Session Dosage Given: 2.67 Gy
Session Number: 7

## 2021-08-14 MED ORDER — PREGABALIN 50 MG PO CAPS: ORAL_CAPSULE | ORAL | 1 refills | Status: DC

## 2021-08-14 NOTE — Progress Notes (Signed)
Subjective:  Patient ID: Carmen [name], female    DOB: 29-Dec-1940  Age: 81 y.o. MRN: [id number]  CC: Medical Management of Chronic Issues   HPI ARINE FOLEY presents forFollow-up of diabetes. Seeing endocrine and recent A1c was 6.6. Cards had her get echo before she started chemo.   Undergoing radiation and has pancytopenia. Tx for L breast Ca.    in for follow-up of elevated cholesterol. Doing well without complaints on current medication. Denies side effects of statin including myalgia and arthralgia and nausea. Currently no chest pain, shortness of breath or other cardiovascular related symptoms noted.   presents for  follow-up of hypertension. Patient has no history of headache chest pain or shortness of breath or recent cough. Patient also denies symptoms of TIA such as focal numbness or weakness. Patient denies side effects from medication. States taking it regularly. Home readings 142/70s. Wants to wait until Radiation done to consider adjustments.  History Teagan has a past medical history of Arthritis, Asymptomatic PVCs, Bilateral shoulder pain, CAD (coronary artery disease), Cancer ([place]) (11/2015), Chronic renal insufficiency, stage 3 (moderate) ([place]), Diabetes mellitus without complication ([place]), Diverticulosis, Endometrial polyp, External hemorrhoids, Goals of care, counseling/discussion (02/07/2021), Gout, Hemochromatosis, hereditary ([place]) (11/24/2012), History of anemia, History of duodenal ulcer (1990), Hyperlipidemia, Hypertension, Internal hemorrhoids, Jaundice, Myocardial infarction ([place]) (2007), Neuromuscular disorder ([place]), PMB (postmenopausal bleeding), PONV (postoperative nausea and vomiting), Prolapse of female pelvic organs, Seizures ([place]), Stage I breast cancer, left ([place]) (12/16/2020), Tick bite (08/12/2017), Type 2 diabetes mellitus with hyperglycemia, without long-term current use of insulin ([place]) (01/27/2019), and Vitamin D deficiency.   She has a past  surgical history that includes Lipoma excision; Breast surgery; Cardiac catheterization (06/03/2005); Colonoscopy (11/28/2001); Upper gi endoscopy (01/20/1989); Coronary artery bypass graft (2007); Dilatation & curettage/hysteroscopy with myosure (N/A, 09/28/2017); Breast lumpectomy with radioactive seed and sentinel lymph node biopsy (Left, 01/14/2021); Portacath placement (Right, 01/14/2021); and Lipoma excision (Left, 01/14/2021).   Her family history includes Arthritis in her sister, sister, and sister; Cancer (age of onset: 75) in her sister; Diabetes in her brother, brother, brother, and father; Diverticulitis in her sister; Hemachromatosis in her brother, sister, sister, and sister; Hypertension in her father and mother; Liver cancer (age of onset: 24) in her maternal grandmother; Lung cancer in her brother; Lupus in her sister; Melanoma (age of onset: 69) in her daughter; Neuropathy in her mother; Stroke in her father and mother.She reports that she has never smoked. She has never used smokeless tobacco. She reports current alcohol use. She reports that she does not use drugs.  Current Outpatient Medications on File Prior to Visit  Medication Sig Dispense Refill   acyclovir (ZOVIRAX) 400 MG tablet Take 1 tablet (400 mg total) by mouth 3 (three) times daily. 90 tablet 1   allopurinol (ZYLOPRIM) 100 MG tablet TAKE (1) TABLET TWICE A DAY. 180 tablet 0   antiseptic oral rinse (BIOTENE) LIQD 15 mLs by Mouth Rinse route as needed for dry mouth. 473 mL ML   aspirin 81 MG tablet Take 81 mg by mouth daily.     Cholecalciferol (VITAMIN D3) 250 MCG (10000 UT) TABS Take by mouth 3 (three) times a week.     colchicine 0.6 MG tablet TAKE (1) TABLET DAILY AS NEEDED. 30 tablet 1   dexamethasone (DECADRON) 4 MG tablet Take 2 tablets (8 mg total) by mouth daily. Take daily for 3 days after chemo. Take with food. 30 tablet 1   diclofenac Sodium (VOLTAREN) 1 % GEL APPLY  4 GRAMS TO AFFECTED AREA(S) 4 TIMES A DAY AS  NEEDED 100 g 0   fluticasone (FLONASE) 50 MCG/ACT nasal spray SPRAY 1 SPRAY IN EACH NOSTRIL ONCE DAILY. 16 g 4   furosemide (LASIX) 20 MG tablet Take 1 tablet (20 mg total) by mouth daily. Must be seen before any further refills. 30 tablet 0   glipiZIDE (GLUCOTROL) 10 MG tablet Take 2 tablets (20 mg total) by mouth daily before breakfast AND 1 tablet (10 mg total) daily before supper. 270 tablet 3   glucose blood (ONE TOUCH ULTRA TEST) test strip CHECK BLOOD SUGAR 2 TIMES A DAY 100 each prn   HYDROcodone-acetaminophen (NORCO/VICODIN) 5-325 MG tablet Take 1 tablet by mouth every 6 (six) hours as needed for moderate pain or severe pain. 15 tablet 0   icosapent Ethyl (VASCEPA) 1 g capsule TAKE (2) CAPSULES TWICE DAILY. 360 capsule 3   insulin lispro (HUMALOG KWIKPEN) 100 UNIT/ML KwikPen Max Daily 25 units 15 mL 11   Insulin Pen Needle 32G X 4 MM MISC 1 Device by Does not apply route 3 (three) times daily. 300 each 1   Lancets Misc. (ACCU-CHEK FASTCLIX LANCET) KIT Use to check Blood Sugars 1 kit 0   lidocaine-prilocaine (EMLA) cream Apply 1 application topically as needed. 30 g 0   magic mouthwash w/lidocaine SOLN Take 5 mLs by mouth 3 (three) times daily as needed for mouth pain. benadryl  525 mg, hydrocortisone 60 mg and nystatin 0.6 mg. 240 mL 6   meclizine (ANTIVERT) 12.5 MG tablet Take 1 tablet (12.5 mg total) by mouth 3 (three) times daily as needed for dizziness. 30 tablet 5   metoprolol tartrate (LOPRESSOR) 25 MG tablet TAKE  (1)  TABLET TWICE A DAY. 180 tablet 3   mupirocin cream (BACTROBAN) 2 % Apply 1 application topically 3 (three) times daily. 30 g 1   OLANZapine (ZYPREXA) 10 MG tablet Take 1 tablet (10 mg total) by mouth at bedtime. 30 tablet 4   olmesartan (BENICAR) 20 MG tablet Take 1 tablet (20 mg total) by mouth daily. 90 tablet 3   PRALUENT 150 MG/ML SOAJ Inject 1 Dose into the skin every 14 (fourteen) days. 2 mL 11   prochlorperazine (COMPAZINE) 10 MG tablet Take 1 tablet (10 mg  total) by mouth every 6 (six) hours as needed (Nausea or vomiting). 30 tablet 1   RESTASIS 0.05 % ophthalmic emulsion Place 2 drops into both eyes 2 (two) times daily as needed.     Semaglutide (RYBELSUS) 7 MG TABS Take 1 tablet by mouth daily before breakfast. 90 tablet 3   VOLTAREN 1 % GEL APPLY 4 GRAMS TO AFFECTED AREA(S) 4 TIMES A DAY AS NEEDED 100 g 0   No current facility-administered medications on file prior to visit.    ROS Review of Systems  Constitutional: Negative.   HENT: Negative.    Eyes:  Negative for visual disturbance.  Respiratory:  Negative for shortness of breath.   Cardiovascular:  Negative for chest pain.  Gastrointestinal:  Negative for abdominal pain.  Musculoskeletal:  Negative for arthralgias.   Objective:  BP (!) 157/82   Pulse 80   Temp (!) 97.5 F (36.4 C)   Ht $R'5\' 3"'ID$  (1.6 m)   Wt 172 lb 9.6 oz (78.3 kg)   SpO2 95%   BMI 30.57 kg/m   BP Readings from Last 3 Encounters:  08/14/21 (!) 157/82  08/07/21 127/69  07/24/21 110/74    Wt Readings from Last 3 Encounters:  08/14/21 172 lb 9.6 oz (78.3 kg)  08/07/21 169 lb (76.7 kg)  07/24/21 175 lb (79.4 kg)     Physical Exam Constitutional:      General: She is not in acute distress.    Appearance: She is well-developed.  Cardiovascular:     Rate and Rhythm: Normal rate and regular rhythm.  Pulmonary:     Breath sounds: Normal breath sounds.  Musculoskeletal:        General: Normal range of motion.  Skin:    General: Skin is warm and dry.  Neurological:     Mental Status: She is alert and oriented to person, place, and time.      Assessment & Plan:   Angelise was seen today for medical management of chronic issues.  Diagnoses and all orders for this visit:  Chronic renal insufficiency, stage 3 (moderate) ([place]) -     CMP14+EGFR -     CBC with Differential/Platelet  Mixed hyperlipidemia -     CMP14+EGFR -     CBC with Differential/Platelet -     Lipid panel  Hypertension  associated with diabetes ([place]) -     CMP14+EGFR -     CBC with Differential/Platelet -     Lipid panel  Malignant neoplasm of lower-outer quadrant of left breast of female, estrogen receptor negative (H[facility])  Type 2 diabetes mellitus with diabetic neuropathy, without long-term current use of insulin ([facility]) -     pregabalin (LYRICA) 50 MG capsule; TAKE ONE CAPSULE BY MOUTH TWICE DAILY      I am having Carmen [name] maintain her aspirin, Accu-Chek FastClix Lancet, glucose blood, fluticasone, colchicine, Voltaren, Restasis, meclizine, HYDROcodone-acetaminophen, icosapent Ethyl, metoprolol tartrate, olmesartan, Vitamin D3, lidocaine-prilocaine, prochlorperazine, dexamethasone, Insulin Pen Needle, diclofenac Sodium, insulin lispro, mupirocin cream, magic mouthwash w/lidocaine, antiseptic oral rinse, furosemide, OLANZapine, acyclovir, Praluent, allopurinol, glipiZIDE, Rybelsus, and pregabalin.  Meds ordered this encounter  Medications   pregabalin (LYRICA) 50 MG capsule    Sig: TAKE ONE CAPSULE BY MOUTH TWICE DAILY    Dispense:  180 capsule    Refill:  1     Follow-up: Return in about 6 months (around 02/14/2022).  Claretta Fraise, M.D.

## 2021-08-15 ENCOUNTER — Ambulatory Visit
Admission: RE | Admit: 2021-08-15 | Discharge: 2021-08-15 | Disposition: A | Discharge status: Home / Self Care | Payer: Medicare Other | Source: Ambulatory Visit | Attending: Radiation Oncology | Admitting: Radiation Oncology

## 2021-08-15 ENCOUNTER — Other Ambulatory Visit: Payer: Self-pay

## 2021-08-15 ENCOUNTER — Encounter: Payer: Self-pay | Admitting: Hematology & Oncology

## 2021-08-15 DIAGNOSIS — Z171 Estrogen receptor negative status [ER-]: Secondary | ICD-10-CM | POA: Diagnosis not present

## 2021-08-15 DIAGNOSIS — Z510 Encounter for antineoplastic radiation therapy: Secondary | ICD-10-CM | POA: Diagnosis not present

## 2021-08-15 DIAGNOSIS — C50512 Malignant neoplasm of lower-outer quadrant of left female breast: Secondary | ICD-10-CM | POA: Diagnosis not present

## 2021-08-15 LAB — RAD ONC ARIA SESSION SUMMARY
Course Elapsed Days: 9
Plan Fractions Treated to Date: 8
Plan Prescribed Dose Per Fraction: 2.67 Gy
Plan Total Fractions Prescribed: 15
Plan Total Prescribed Dose: 40.05 Gy
Reference Point Dosage Given to Date: 21.36 Gy
Reference Point Session Dosage Given: 2.67 Gy
Session Number: 8

## 2021-08-15 LAB — CBC WITH DIFFERENTIAL/PLATELET
Basophils Absolute: 0 10*3/uL (ref 0.0–0.2)
Basos: 1 %
EOS (ABSOLUTE): 0.3 10*3/uL (ref 0.0–0.4)
Eos: 6 %
Hematocrit: 33.9 % — ABNORMAL LOW (ref 34.0–46.6)
Hemoglobin: 11.8 g/dL (ref 11.1–15.9)
Immature Grans (Abs): 0 10*3/uL (ref 0.0–0.1)
Immature Granulocytes: 1 %
Lymphocytes Absolute: 1 10*3/uL (ref 0.7–3.1)
Lymphs: 17 %
MCH: 34.1 pg — ABNORMAL HIGH (ref 26.6–33.0)
MCHC: 34.8 g/dL (ref 31.5–35.7)
MCV: 98 fL — ABNORMAL HIGH (ref 79–97)
Monocytes Absolute: 0.4 10*3/uL (ref 0.1–0.9)
Monocytes: 7 %
Neutrophils Absolute: 4.2 10*3/uL (ref 1.4–7.0)
Neutrophils: 68 %
Platelets: 120 10*3/uL — ABNORMAL LOW (ref 150–450)
RBC: 3.46 x10E6/uL — ABNORMAL LOW (ref 3.77–5.28)
RDW: 14.1 % (ref 11.7–15.4)
WBC: 6.1 10*3/uL (ref 3.4–10.8)

## 2021-08-15 LAB — CMP14+EGFR
ALT: 15 IU/L (ref 0–32)
AST: 23 IU/L (ref 0–40)
Albumin/Globulin Ratio: 2.1 (ref 1.2–2.2)
Albumin: 4.1 g/dL (ref 3.6–4.6)
Alkaline Phosphatase: 76 IU/L (ref 44–121)
BUN/Creatinine Ratio: 11 — ABNORMAL LOW (ref 12–28)
BUN: 10 mg/dL (ref 8–27)
Bilirubin Total: 0.3 mg/dL (ref 0.0–1.2)
CO2: 27 mmol/L (ref 20–29)
Calcium: 9.9 mg/dL (ref 8.7–10.3)
Chloride: 103 mmol/L (ref 96–106)
Creatinine, Ser: 0.92 mg/dL (ref 0.57–1.00)
Globulin, Total: 2 g/dL (ref 1.5–4.5)
Glucose: 188 mg/dL — ABNORMAL HIGH (ref 70–99)
Potassium: 4.3 mmol/L (ref 3.5–5.2)
Sodium: 145 mmol/L — ABNORMAL HIGH (ref 134–144)
Total Protein: 6.1 g/dL (ref 6.0–8.5)
eGFR: 63 mL/min/{1.73_m2} (ref >59)

## 2021-08-15 LAB — LIPID PANEL
Chol/HDL Ratio: 2.9 ratio (ref 0.0–4.4)
Cholesterol, Total: 133 mg/dL (ref 100–199)
HDL: 46 mg/dL (ref >39)
LDL Chol Calc (NIH): 46 mg/dL (ref 0–99)
Triglycerides: 265 mg/dL — ABNORMAL HIGH (ref 0–149)
VLDL Cholesterol Cal: 41 mg/dL — ABNORMAL HIGH (ref 5–40)

## 2021-08-19 ENCOUNTER — Other Ambulatory Visit: Payer: Self-pay

## 2021-08-19 ENCOUNTER — Ambulatory Visit
Admission: RE | Admit: 2021-08-19 | Discharge: 2021-08-19 | Disposition: A | Discharge status: Home / Self Care | Payer: Medicare Other | Source: Ambulatory Visit | Attending: Radiation Oncology | Admitting: Radiation Oncology

## 2021-08-19 DIAGNOSIS — Z510 Encounter for antineoplastic radiation therapy: Secondary | ICD-10-CM | POA: Diagnosis not present

## 2021-08-19 DIAGNOSIS — Z171 Estrogen receptor negative status [ER-]: Secondary | ICD-10-CM | POA: Diagnosis not present

## 2021-08-19 DIAGNOSIS — C50512 Malignant neoplasm of lower-outer quadrant of left female breast: Secondary | ICD-10-CM | POA: Diagnosis not present

## 2021-08-19 LAB — RAD ONC ARIA SESSION SUMMARY
Course Elapsed Days: 13
Plan Fractions Treated to Date: 9
Plan Prescribed Dose Per Fraction: 2.67 Gy
Plan Total Fractions Prescribed: 15
Plan Total Prescribed Dose: 40.05 Gy
Reference Point Dosage Given to Date: 24.03 Gy
Reference Point Session Dosage Given: 2.67 Gy
Session Number: 9

## 2021-08-19 NOTE — Progress Notes (Signed)
Hello Velicia,  Your lab result is normal and/or stable.Some minor variations that are not significant are commonly marked abnormal, but do not represent any medical problem for you.  Best regards, Claretta Fraise, M.D.

## 2021-08-20 ENCOUNTER — Ambulatory Visit: Payer: Medicare Other

## 2021-08-20 ENCOUNTER — Other Ambulatory Visit: Payer: Self-pay

## 2021-08-20 ENCOUNTER — Ambulatory Visit
Admission: RE | Admit: 2021-08-20 | Discharge: 2021-08-20 | Disposition: A | Discharge status: Home / Self Care | Payer: Medicare Other | Source: Ambulatory Visit | Attending: Radiation Oncology | Admitting: Radiation Oncology

## 2021-08-20 DIAGNOSIS — Z510 Encounter for antineoplastic radiation therapy: Secondary | ICD-10-CM | POA: Diagnosis not present

## 2021-08-20 DIAGNOSIS — C50512 Malignant neoplasm of lower-outer quadrant of left female breast: Secondary | ICD-10-CM | POA: Diagnosis not present

## 2021-08-20 DIAGNOSIS — Z171 Estrogen receptor negative status [ER-]: Secondary | ICD-10-CM | POA: Diagnosis not present

## 2021-08-20 LAB — RAD ONC ARIA SESSION SUMMARY
Course Elapsed Days: 14
Plan Fractions Treated to Date: 10
Plan Prescribed Dose Per Fraction: 2.67 Gy
Plan Total Fractions Prescribed: 15
Plan Total Prescribed Dose: 40.05 Gy
Reference Point Dosage Given to Date: 26.7 Gy
Reference Point Session Dosage Given: 2.67 Gy
Session Number: 10

## 2021-08-21 ENCOUNTER — Other Ambulatory Visit: Payer: Self-pay

## 2021-08-21 ENCOUNTER — Ambulatory Visit
Admission: RE | Admit: 2021-08-21 | Discharge: 2021-08-21 | Disposition: A | Discharge status: Home / Self Care | Payer: Medicare Other | Source: Ambulatory Visit | Attending: Radiation Oncology | Admitting: Radiation Oncology

## 2021-08-21 DIAGNOSIS — Z171 Estrogen receptor negative status [ER-]: Secondary | ICD-10-CM | POA: Diagnosis not present

## 2021-08-21 DIAGNOSIS — Z510 Encounter for antineoplastic radiation therapy: Secondary | ICD-10-CM | POA: Insufficient documentation

## 2021-08-21 DIAGNOSIS — C50512 Malignant neoplasm of lower-outer quadrant of left female breast: Secondary | ICD-10-CM | POA: Diagnosis not present

## 2021-08-21 LAB — RAD ONC ARIA SESSION SUMMARY
Course Elapsed Days: 15
Plan Fractions Treated to Date: 11
Plan Prescribed Dose Per Fraction: 2.67 Gy
Plan Total Fractions Prescribed: 15
Plan Total Prescribed Dose: 40.05 Gy
Reference Point Dosage Given to Date: 29.37 Gy
Reference Point Session Dosage Given: 2.67 Gy
Session Number: 11

## 2021-08-22 ENCOUNTER — Ambulatory Visit
Admission: RE | Admit: 2021-08-22 | Discharge: 2021-08-22 | Disposition: A | Discharge status: Home / Self Care | Payer: Medicare Other | Source: Ambulatory Visit | Attending: Radiation Oncology | Admitting: Radiation Oncology

## 2021-08-22 ENCOUNTER — Other Ambulatory Visit: Payer: Self-pay

## 2021-08-22 DIAGNOSIS — Z510 Encounter for antineoplastic radiation therapy: Secondary | ICD-10-CM | POA: Diagnosis not present

## 2021-08-22 DIAGNOSIS — C50512 Malignant neoplasm of lower-outer quadrant of left female breast: Secondary | ICD-10-CM | POA: Diagnosis not present

## 2021-08-22 DIAGNOSIS — Z171 Estrogen receptor negative status [ER-]: Secondary | ICD-10-CM | POA: Diagnosis not present

## 2021-08-22 LAB — RAD ONC ARIA SESSION SUMMARY
Course Elapsed Days: 16
Plan Fractions Treated to Date: 12
Plan Prescribed Dose Per Fraction: 2.67 Gy
Plan Total Fractions Prescribed: 15
Plan Total Prescribed Dose: 40.05 Gy
Reference Point Dosage Given to Date: 32.04 Gy
Reference Point Session Dosage Given: 2.67 Gy
Session Number: 12

## 2021-08-25 ENCOUNTER — Other Ambulatory Visit: Payer: Self-pay

## 2021-08-25 ENCOUNTER — Ambulatory Visit
Admission: RE | Admit: 2021-08-25 | Discharge: 2021-08-25 | Disposition: A | Discharge status: Home / Self Care | Payer: Medicare Other | Source: Ambulatory Visit | Attending: Radiation Oncology | Admitting: Radiation Oncology

## 2021-08-25 DIAGNOSIS — Z171 Estrogen receptor negative status [ER-]: Secondary | ICD-10-CM | POA: Diagnosis not present

## 2021-08-25 DIAGNOSIS — C50512 Malignant neoplasm of lower-outer quadrant of left female breast: Secondary | ICD-10-CM | POA: Diagnosis not present

## 2021-08-25 DIAGNOSIS — Z510 Encounter for antineoplastic radiation therapy: Secondary | ICD-10-CM | POA: Diagnosis not present

## 2021-08-25 LAB — RAD ONC ARIA SESSION SUMMARY
Course Elapsed Days: 19
Plan Fractions Treated to Date: 13
Plan Prescribed Dose Per Fraction: 2.67 Gy
Plan Total Fractions Prescribed: 15
Plan Total Prescribed Dose: 40.05 Gy
Reference Point Dosage Given to Date: 34.71 Gy
Reference Point Session Dosage Given: 2.67 Gy
Session Number: 13

## 2021-08-26 ENCOUNTER — Other Ambulatory Visit: Payer: Self-pay

## 2021-08-26 ENCOUNTER — Ambulatory Visit
Admission: RE | Admit: 2021-08-26 | Discharge: 2021-08-26 | Disposition: A | Discharge status: Home / Self Care | Payer: Medicare Other | Source: Ambulatory Visit | Attending: Radiation Oncology | Admitting: Radiation Oncology

## 2021-08-26 ENCOUNTER — Ambulatory Visit: Payer: Medicare Other

## 2021-08-26 ENCOUNTER — Ambulatory Visit: Payer: Medicare Other | Admitting: Radiation Oncology

## 2021-08-26 DIAGNOSIS — Z510 Encounter for antineoplastic radiation therapy: Secondary | ICD-10-CM | POA: Diagnosis not present

## 2021-08-26 DIAGNOSIS — C50512 Malignant neoplasm of lower-outer quadrant of left female breast: Secondary | ICD-10-CM | POA: Diagnosis not present

## 2021-08-26 DIAGNOSIS — Z171 Estrogen receptor negative status [ER-]: Secondary | ICD-10-CM | POA: Diagnosis not present

## 2021-08-26 LAB — RAD ONC ARIA SESSION SUMMARY
Course Elapsed Days: 20
Plan Fractions Treated to Date: 14
Plan Prescribed Dose Per Fraction: 2.67 Gy
Plan Total Fractions Prescribed: 15
Plan Total Prescribed Dose: 40.05 Gy
Reference Point Dosage Given to Date: 37.38 Gy
Reference Point Session Dosage Given: 2.67 Gy
Session Number: 14

## 2021-08-27 ENCOUNTER — Other Ambulatory Visit: Payer: Self-pay

## 2021-08-27 ENCOUNTER — Ambulatory Visit
Admission: RE | Admit: 2021-08-27 | Discharge: 2021-08-27 | Disposition: A | Discharge status: Home / Self Care | Payer: Medicare Other | Source: Ambulatory Visit | Attending: Radiation Oncology | Admitting: Radiation Oncology

## 2021-08-27 DIAGNOSIS — Z510 Encounter for antineoplastic radiation therapy: Secondary | ICD-10-CM | POA: Diagnosis not present

## 2021-08-27 DIAGNOSIS — C50512 Malignant neoplasm of lower-outer quadrant of left female breast: Secondary | ICD-10-CM | POA: Diagnosis not present

## 2021-08-27 DIAGNOSIS — Z171 Estrogen receptor negative status [ER-]: Secondary | ICD-10-CM | POA: Diagnosis not present

## 2021-08-27 LAB — RAD ONC ARIA SESSION SUMMARY
Course Elapsed Days: 21
Plan Fractions Treated to Date: 15
Plan Prescribed Dose Per Fraction: 2.67 Gy
Plan Total Fractions Prescribed: 15
Plan Total Prescribed Dose: 40.05 Gy
Reference Point Dosage Given to Date: 40.05 Gy
Reference Point Session Dosage Given: 2.67 Gy
Session Number: 15

## 2021-08-28 ENCOUNTER — Other Ambulatory Visit: Payer: Self-pay

## 2021-08-28 ENCOUNTER — Ambulatory Visit
Admission: RE | Admit: 2021-08-28 | Discharge: 2021-08-28 | Disposition: A | Discharge status: Home / Self Care | Payer: Medicare Other | Source: Ambulatory Visit | Attending: Radiation Oncology | Admitting: Radiation Oncology

## 2021-08-28 DIAGNOSIS — Z510 Encounter for antineoplastic radiation therapy: Secondary | ICD-10-CM | POA: Diagnosis not present

## 2021-08-28 DIAGNOSIS — Z171 Estrogen receptor negative status [ER-]: Secondary | ICD-10-CM | POA: Diagnosis not present

## 2021-08-28 DIAGNOSIS — C50512 Malignant neoplasm of lower-outer quadrant of left female breast: Secondary | ICD-10-CM | POA: Diagnosis not present

## 2021-08-28 LAB — RAD ONC ARIA SESSION SUMMARY
Course Elapsed Days: 22
Plan Fractions Treated to Date: 1
Plan Prescribed Dose Per Fraction: 2 Gy
Plan Total Fractions Prescribed: 6
Plan Total Prescribed Dose: 12 Gy
Reference Point Dosage Given to Date: 42.05 Gy
Reference Point Session Dosage Given: 2 Gy
Session Number: 16

## 2021-08-29 ENCOUNTER — Ambulatory Visit
Admission: RE | Admit: 2021-08-29 | Discharge: 2021-08-29 | Disposition: A | Discharge status: Home / Self Care | Payer: Medicare Other | Source: Ambulatory Visit | Attending: Radiation Oncology | Admitting: Radiation Oncology

## 2021-08-29 ENCOUNTER — Other Ambulatory Visit: Payer: Self-pay

## 2021-08-29 DIAGNOSIS — C50512 Malignant neoplasm of lower-outer quadrant of left female breast: Secondary | ICD-10-CM | POA: Diagnosis not present

## 2021-08-29 DIAGNOSIS — Z171 Estrogen receptor negative status [ER-]: Secondary | ICD-10-CM | POA: Diagnosis not present

## 2021-08-29 DIAGNOSIS — Z510 Encounter for antineoplastic radiation therapy: Secondary | ICD-10-CM | POA: Diagnosis not present

## 2021-08-29 LAB — RAD ONC ARIA SESSION SUMMARY
Course Elapsed Days: 23
Plan Fractions Treated to Date: 2
Plan Prescribed Dose Per Fraction: 2 Gy
Plan Total Fractions Prescribed: 6
Plan Total Prescribed Dose: 12 Gy
Reference Point Dosage Given to Date: 44.05 Gy
Reference Point Session Dosage Given: 2 Gy
Session Number: 17

## 2021-09-01 ENCOUNTER — Other Ambulatory Visit: Payer: Self-pay

## 2021-09-01 ENCOUNTER — Ambulatory Visit
Admission: RE | Admit: 2021-09-01 | Discharge: 2021-09-01 | Disposition: A | Discharge status: Home / Self Care | Payer: Medicare Other | Source: Ambulatory Visit | Attending: Radiation Oncology | Admitting: Radiation Oncology

## 2021-09-01 DIAGNOSIS — Z510 Encounter for antineoplastic radiation therapy: Secondary | ICD-10-CM | POA: Diagnosis not present

## 2021-09-01 DIAGNOSIS — C50512 Malignant neoplasm of lower-outer quadrant of left female breast: Secondary | ICD-10-CM | POA: Diagnosis not present

## 2021-09-01 DIAGNOSIS — Z171 Estrogen receptor negative status [ER-]: Secondary | ICD-10-CM | POA: Diagnosis not present

## 2021-09-01 LAB — RAD ONC ARIA SESSION SUMMARY
Course Elapsed Days: 26
Plan Fractions Treated to Date: 3
Plan Prescribed Dose Per Fraction: 2 Gy
Plan Total Fractions Prescribed: 6
Plan Total Prescribed Dose: 12 Gy
Reference Point Dosage Given to Date: 46.05 Gy
Reference Point Session Dosage Given: 2 Gy
Session Number: 18

## 2021-09-02 ENCOUNTER — Other Ambulatory Visit: Payer: Self-pay

## 2021-09-02 ENCOUNTER — Ambulatory Visit: Payer: Medicare Other

## 2021-09-02 ENCOUNTER — Ambulatory Visit
Admission: RE | Admit: 2021-09-02 | Discharge: 2021-09-02 | Disposition: A | Discharge status: Home / Self Care | Payer: Medicare Other | Source: Ambulatory Visit | Attending: Radiation Oncology | Admitting: Radiation Oncology

## 2021-09-02 DIAGNOSIS — C50512 Malignant neoplasm of lower-outer quadrant of left female breast: Secondary | ICD-10-CM | POA: Diagnosis not present

## 2021-09-02 DIAGNOSIS — Z171 Estrogen receptor negative status [ER-]: Secondary | ICD-10-CM | POA: Diagnosis not present

## 2021-09-02 DIAGNOSIS — Z510 Encounter for antineoplastic radiation therapy: Secondary | ICD-10-CM | POA: Diagnosis not present

## 2021-09-02 LAB — RAD ONC ARIA SESSION SUMMARY
Course Elapsed Days: 27
Plan Fractions Treated to Date: 4
Plan Prescribed Dose Per Fraction: 2 Gy
Plan Total Fractions Prescribed: 6
Plan Total Prescribed Dose: 12 Gy
Reference Point Dosage Given to Date: 48.05 Gy
Reference Point Session Dosage Given: 2 Gy
Session Number: 19

## 2021-09-03 ENCOUNTER — Other Ambulatory Visit: Payer: Self-pay

## 2021-09-03 ENCOUNTER — Ambulatory Visit
Admission: RE | Admit: 2021-09-03 | Discharge: 2021-09-03 | Disposition: A | Discharge status: Home / Self Care | Payer: Medicare Other | Source: Ambulatory Visit | Attending: Radiation Oncology | Admitting: Radiation Oncology

## 2021-09-03 DIAGNOSIS — Z510 Encounter for antineoplastic radiation therapy: Secondary | ICD-10-CM | POA: Diagnosis not present

## 2021-09-03 DIAGNOSIS — C50512 Malignant neoplasm of lower-outer quadrant of left female breast: Secondary | ICD-10-CM | POA: Diagnosis not present

## 2021-09-03 DIAGNOSIS — Z171 Estrogen receptor negative status [ER-]: Secondary | ICD-10-CM | POA: Diagnosis not present

## 2021-09-03 LAB — RAD ONC ARIA SESSION SUMMARY
Course Elapsed Days: 28
Plan Fractions Treated to Date: 5
Plan Prescribed Dose Per Fraction: 2 Gy
Plan Total Fractions Prescribed: 6
Plan Total Prescribed Dose: 12 Gy
Reference Point Dosage Given to Date: 50.05 Gy
Reference Point Session Dosage Given: 2 Gy
Session Number: 20

## 2021-09-04 ENCOUNTER — Encounter: Payer: Self-pay | Admitting: Radiation Oncology

## 2021-09-04 ENCOUNTER — Other Ambulatory Visit: Payer: Self-pay

## 2021-09-04 ENCOUNTER — Ambulatory Visit
Admission: RE | Admit: 2021-09-04 | Discharge: 2021-09-04 | Disposition: A | Discharge status: Home / Self Care | Payer: Medicare Other | Source: Ambulatory Visit | Attending: Radiation Oncology | Admitting: Radiation Oncology

## 2021-09-04 DIAGNOSIS — Z510 Encounter for antineoplastic radiation therapy: Secondary | ICD-10-CM | POA: Diagnosis not present

## 2021-09-04 DIAGNOSIS — Z171 Estrogen receptor negative status [ER-]: Secondary | ICD-10-CM | POA: Diagnosis not present

## 2021-09-04 DIAGNOSIS — C50512 Malignant neoplasm of lower-outer quadrant of left female breast: Secondary | ICD-10-CM | POA: Diagnosis not present

## 2021-09-04 LAB — RAD ONC ARIA SESSION SUMMARY
Course Elapsed Days: 29
Plan Fractions Treated to Date: 6
Plan Prescribed Dose Per Fraction: 2 Gy
Plan Total Fractions Prescribed: 6
Plan Total Prescribed Dose: 12 Gy
Reference Point Dosage Given to Date: 52.05 Gy
Reference Point Session Dosage Given: 2 Gy
Session Number: 21

## 2021-09-05 ENCOUNTER — Encounter: Payer: Self-pay | Admitting: *Deleted

## 2021-09-05 DIAGNOSIS — C50912 Malignant neoplasm of unspecified site of left female breast: Secondary | ICD-10-CM

## 2021-09-05 DIAGNOSIS — Z95828 Presence of other vascular implants and grafts: Secondary | ICD-10-CM

## 2021-09-05 MED ORDER — AMOXICILLIN 500 MG PO TABS: ORAL_TABLET | ORAL | 2 refills | Status: DC

## 2021-09-05 NOTE — Progress Notes (Signed)
Patient calls with c/o broken tooth. She finished radiation yesterday and went out to dinner to celebrate and broke her front tooth at the gum line. She will reach out to her dentist, but she wanted to know if she needed any special considerations.   Patient instructed that as long as her port is in place, she will need prophylactic antibiotics prior to dental procedures. Prescription sent and pharmacy confirmed. Patient knows to take one hour prior to dental procedure.   Oncology Nurse Navigator Documentation     09/05/2021    9:45 AM  Oncology Nurse Navigator Flowsheets  Phase of Treatment Radiation  Radiation Actual End Date: 09/04/2021  Navigator Follow Up Date: 10/09/2021  Navigator Follow Up Reason: Follow-up Appointment  Navigator Location [facility]  Navigator Encounter Type Telephone  Telephone Incoming Call;Medication Assistance  Patient Visit Type MedOnc  Treatment Phase Active Tx  Barriers/Navigation Needs Education  Education Other  Interventions Medication Assistance  Acuity Level 1-No Barriers  Education Method Verbal;Teach-back  Support Groups/Services Friends and Family  Time Spent with Patient 15

## 2021-09-08 ENCOUNTER — Other Ambulatory Visit: Payer: Self-pay | Admitting: Pharmacist

## 2021-10-01 ENCOUNTER — Ambulatory Visit: Payer: Self-pay | Admitting: *Deleted

## 2021-10-01 ENCOUNTER — Encounter: Payer: Self-pay | Admitting: Radiation Oncology

## 2021-10-01 NOTE — Chronic Care Management (AMB) (Signed)
  Chronic Care Management   Note  10/01/2021 Name: Carmen [name] MRN: [id number] DOB: 11-28-1940   Patient has not recently engaged with the Chronic Care Management RN Care Manager. Removing RN Care Manager from Care Team and closing [place]. If patient is currently engaged with another CCM team member I will forward this encounter to inform them of my case closure. Patient may be eligible for re-engagement with RN Care Manager in the future if necessary and can discuss this with their PCP.  Chong Sicilian, BSN, RN-BC Embedded Chronic Care Manager [facility] Direct Dial: [phone]

## 2021-10-03 ENCOUNTER — Other Ambulatory Visit: Payer: Self-pay | Admitting: Family Medicine

## 2021-10-03 DIAGNOSIS — E1159 Type 2 diabetes mellitus with other circulatory complications: Secondary | ICD-10-CM

## 2021-10-03 DIAGNOSIS — Z951 Presence of aortocoronary bypass graft: Secondary | ICD-10-CM

## 2021-10-04 NOTE — Progress Notes (Incomplete)
Radiation Oncology         [phone] ________________________________  Name: Carmen [name] MRN: [id number]  Date: 10/06/2021  DOB: [birth year]  Follow-Up Visit Note  CC: Claretta Fraise, MD  Volanda Napoleon, MD  No diagnosis found.  Diagnosis: The primary encounter diagnosis was Malignant neoplasm of lower-outer quadrant of left breast of female, estrogen receptor negative ([place]). A diagnosis of Stage I breast cancer, left ([place]) was also pertinent to this visit.   S/p lumpectomy and adjuvant chemotherapy: Stage IIB (cT2, cN0, cM0) Left Breast LOQ, Invasive Ductal Carcinoma, ER- / PR- / Her2-, Grade 3  Interval Since Last Radiation: 1 month and 2 days   Intent: Curative  Radiation Treatment Dates: 08/06/2021 through 09/04/2021 Site Technique Total Dose (Gy) Dose per Fx (Gy) Completed Fx Beam Energies  Breast, Left: Breast_L_Bst 3D 12/12 2 6/6 6X    Narrative:  The patient returns today for routine follow-up.  The patient tolerated radiation therapy relatively well. During her final weekly treatment check on 09/02/21, the patient reported pain, mild fatigue, and some skin irritation. Physical exam performed on that same date showed mild erythema and mild hyperpigmentation changes to the left breast area. No skin breakdown was appreciated.     In the interval since her initial consultation, the patient followed up with Dr. Marin Olp on 08/07/21. During which time, the patient was noted feel well since completing chemotherapy other than some ongoing fatigue. The patient will follow up again with Dr. Marin Olp later this month.   ***                    Allergies:  is allergic to zofran [ondansetron hcl], cymbalta [duloxetine hcl], farxiga [dapagliflozin], januvia [sitagliptin], meloxicam, shellfish allergy, trulicity [dulaglutide], crestor [rosuvastatin], diclofenac, lipitor [atorvastatin], livalo [pitavastatin], sulfa antibiotics, tape, zetia [ezetimibe], and zocor  [simvastatin].  Meds: Current Outpatient Medications  Medication Sig Dispense Refill   acyclovir (ZOVIRAX) 400 MG tablet Take 1 tablet (400 mg total) by mouth 3 (three) times daily. 90 tablet 1   allopurinol (ZYLOPRIM) 100 MG tablet TAKE (1) TABLET TWICE A DAY. 180 tablet 0   amoxicillin (AMOXIL) 500 MG tablet Take one hour before dental work 4 tablet 2   antiseptic oral rinse (BIOTENE) LIQD 15 mLs by Mouth Rinse route as needed for dry mouth. 473 mL ML   aspirin 81 MG tablet Take 81 mg by mouth daily.     Cholecalciferol (VITAMIN D3) 250 MCG (10000 UT) TABS Take by mouth 3 (three) times a week.     colchicine 0.6 MG tablet TAKE (1) TABLET DAILY AS NEEDED. 30 tablet 1   diclofenac Sodium (VOLTAREN) 1 % GEL APPLY 4 GRAMS TO AFFECTED AREA(S) 4 TIMES A DAY AS NEEDED 100 g 0   fluticasone (FLONASE) 50 MCG/ACT nasal spray SPRAY 1 SPRAY IN EACH NOSTRIL ONCE DAILY. 16 g 4   furosemide (LASIX) 20 MG tablet Take 1 tablet (20 mg total) by mouth daily. Must be seen before any further refills. 30 tablet 0   glipiZIDE (GLUCOTROL) 10 MG tablet Take 2 tablets (20 mg total) by mouth daily before breakfast AND 1 tablet (10 mg total) daily before supper. 270 tablet 3   glucose blood (ONE TOUCH ULTRA TEST) test strip CHECK BLOOD SUGAR 2 TIMES A DAY 100 each prn   HYDROcodone-acetaminophen (NORCO/VICODIN) 5-325 MG tablet Take 1 tablet by mouth every 6 (six) hours as needed for moderate pain or severe pain. 15 tablet 0   icosapent Ethyl (  VASCEPA) 1 g capsule TAKE (2) CAPSULES TWICE DAILY. 360 capsule 3   insulin lispro (HUMALOG KWIKPEN) 100 UNIT/ML KwikPen Max Daily 25 units 15 mL 11   Insulin Pen Needle 32G X 4 MM MISC 1 Device by Does not apply route 3 (three) times daily. 300 each 1   Lancets Misc. (ACCU-CHEK FASTCLIX LANCET) KIT Use to check Blood Sugars 1 kit 0   lidocaine-prilocaine (EMLA) cream Apply 1 application topically as needed. 30 g 0   magic mouthwash w/lidocaine SOLN Take 5 mLs by mouth 3 (three)  times daily as needed for mouth pain. benadryl  525 mg, hydrocortisone 60 mg and nystatin 0.6 mg. 240 mL 6   meclizine (ANTIVERT) 12.5 MG tablet Take 1 tablet (12.5 mg total) by mouth 3 (three) times daily as needed for dizziness. 30 tablet 5   metoprolol tartrate (LOPRESSOR) 25 MG tablet TAKE  (1)  TABLET TWICE A DAY. 180 tablet 3   mupirocin cream (BACTROBAN) 2 % Apply 1 application topically 3 (three) times daily. 30 g 1   OLANZapine (ZYPREXA) 10 MG tablet Take 1 tablet (10 mg total) by mouth at bedtime. 30 tablet 4   olmesartan (BENICAR) 20 MG tablet Take 1 tablet (20 mg total) by mouth daily. 90 tablet 3   PRALUENT 150 MG/ML SOAJ Inject 1 Dose into the skin every 14 (fourteen) days. 2 mL 11   pregabalin (LYRICA) 50 MG capsule TAKE ONE CAPSULE BY MOUTH TWICE DAILY 180 capsule 1   RESTASIS 0.05 % ophthalmic emulsion Place 2 drops into both eyes 2 (two) times daily as needed.     Semaglutide (RYBELSUS) 7 MG TABS Take 1 tablet by mouth daily before breakfast. 90 tablet 3   VOLTAREN 1 % GEL APPLY 4 GRAMS TO AFFECTED AREA(S) 4 TIMES A DAY AS NEEDED 100 g 0   No current facility-administered medications for this encounter.    Physical Findings: The patient is in no acute distress. Patient is alert and oriented.  vitals were not taken for this visit. .  No significant changes. Lungs are clear to auscultation bilaterally. Heart has regular rate and rhythm. No palpable cervical, supraclavicular, or axillary adenopathy. Abdomen soft, non-tender, normal bowel sounds.  Right Breast: no palpable mass, nipple discharge or bleeding. Left Breast: ***   Lab Findings: Lab Results  Component Value Date   WBC 6.1 08/14/2021   HGB 11.8 08/14/2021   HCT 33.9 (L) 08/14/2021   MCV 98 (H) 08/14/2021   PLT 120 (L) 08/14/2021    Radiographic Findings: No results found.  Impression:  S/p lumpectomy and adjuvant chemotherapy: Stage IIB (cT2, cN0, cM0) Left Breast LOQ, Invasive Ductal Carcinoma, ER- / PR-  / Her2-, Grade 3  The patient is recovering from the effects of radiation.  ***  Plan:  ***   *** minutes of total time was spent for this patient encounter, including preparation, face-to-face counseling with the patient and coordination of care, physical exam, and documentation of the encounter. ____________________________________  Blair Promise, PhD, MD  This document serves as a record of services personally performed by Gery Pray, MD. It was created on his behalf by Roney Mans, a trained medical scribe. The creation of this record is based on the scribe's personal observations and the provider's statements to them. This document has been checked and approved by the attending provider.

## 2021-10-04 NOTE — Progress Notes (Incomplete)
  Radiation Oncology         [phone] ________________________________  Patient Name: Carmen [name] MRN: [id number] DOB: [birth year] Referring Physician: Burney Gauze (Profile Not Attached) Date of Service: 09/04/2021 [facility], [place]                                                        End Of Treatment Note  Diagnoses: C50.512-Malignant neoplasm of lower-outer quadrant of left female breast Z17.1-Estrogen receptor negative status [ER-]  Cancer Staging: The primary encounter diagnosis was Malignant neoplasm of lower-outer quadrant of left breast of female, estrogen receptor negative ([place]). A diagnosis of Stage I breast cancer, left ([place]) was also pertinent to this visit.   S/p lumpectomy and adjuvant chemotherapy: Stage IIB (cT2, cN0, cM0) Left Breast LOQ, Invasive Ductal Carcinoma, ER- / PR- / Her2-, Grade 3  Intent: Curative  Radiation Treatment Dates: 08/06/2021 through 09/04/2021 Site Technique Total Dose (Gy) Dose per Fx (Gy) Completed Fx Beam Energies  Breast, Left: Breast_L_Bst 3D 12/12 2 6/6 6X   Narrative: The patient tolerated radiation therapy relatively well. During her final weekly treatment check on 09/02/21, the patient reported pain, mild fatigue, and some skin irritation. Physical exam performed on that same date showed mild erythema and mild hyperpigmentation changes to the left breast area.  No skin breakdown was appreciated.  Plan: The patient will follow-up with radiation oncology in one month .  ________________________________________________  -----------------------------------  Blair Promise, PhD, MD  This document serves as a record of services personally performed by Gery Pray, MD. It was created on his behalf by Roney Mans, a trained medical scribe. The creation of this record is based on the scribe's personal observations and the provider's statements to them. This document has been checked and approved by the  attending provider.

## 2021-10-06 ENCOUNTER — Other Ambulatory Visit: Payer: Self-pay

## 2021-10-06 ENCOUNTER — Encounter: Payer: Self-pay | Admitting: Radiation Oncology

## 2021-10-06 ENCOUNTER — Ambulatory Visit
Admission: RE | Admit: 2021-10-06 | Discharge: 2021-10-06 | Disposition: A | Discharge status: Home / Self Care | Payer: Medicare Other | Source: Ambulatory Visit | Attending: Radiation Oncology | Admitting: Radiation Oncology

## 2021-10-06 VITALS — BP 151/69 | HR 65 | Temp 97.8°F | Resp 18 | Ht 63.0 in | Wt 170.4 lb

## 2021-10-06 DIAGNOSIS — C50512 Malignant neoplasm of lower-outer quadrant of left female breast: Secondary | ICD-10-CM

## 2021-10-06 DIAGNOSIS — Z7982 Long term (current) use of aspirin: Secondary | ICD-10-CM | POA: Diagnosis not present

## 2021-10-06 DIAGNOSIS — Z7984 Long term (current) use of oral hypoglycemic drugs: Secondary | ICD-10-CM | POA: Diagnosis not present

## 2021-10-06 DIAGNOSIS — Z853 Personal history of malignant neoplasm of breast: Secondary | ICD-10-CM | POA: Insufficient documentation

## 2021-10-06 DIAGNOSIS — C50912 Malignant neoplasm of unspecified site of left female breast: Secondary | ICD-10-CM

## 2021-10-06 DIAGNOSIS — Z923 Personal history of irradiation: Secondary | ICD-10-CM | POA: Diagnosis not present

## 2021-10-06 DIAGNOSIS — Z79899 Other long term (current) drug therapy: Secondary | ICD-10-CM | POA: Diagnosis not present

## 2021-10-06 HISTORY — DX: Personal history of irradiation: Z92.3

## 2021-10-06 NOTE — Progress Notes (Signed)
Carmen [name] is here today for follow up post radiation to the breast.   Breast Side:Left   They completed their radiation on: 09/04/2021   Does the patient complain of any of the following: Post radiation skin issues: no Breast Tenderness: Yes - wondering when she will need a mammogram. Breast Swelling: occasionally feels like it is swollen. Lymphadema: no Range of Motion limitations: no Fatigue post radiation: yes Appetite good/fair/poor:  good  Additional comments if applicable: none  BP (!) 151/69 (BP Location: Right Arm, Patient Position: Sitting, Cuff Size: Large)   Pulse 65   Temp 97.8 F (36.6 C) (Oral)   Resp 18   Ht '5\' 3"'$  (1.6 m)   Wt 170 lb 6 oz (77.3 kg)   SpO2 97%   BMI 30.18 kg/m

## 2021-10-09 ENCOUNTER — Inpatient Hospital Stay (HOSPITAL_BASED_OUTPATIENT_CLINIC_OR_DEPARTMENT_OTHER): Payer: Medicare Other | Admitting: Hematology & Oncology

## 2021-10-09 ENCOUNTER — Inpatient Hospital Stay: Payer: Medicare Other

## 2021-10-09 ENCOUNTER — Encounter: Payer: Self-pay | Admitting: Hematology & Oncology

## 2021-10-09 ENCOUNTER — Inpatient Hospital Stay: Payer: Medicare Other | Attending: Hematology & Oncology

## 2021-10-09 VITALS — BP 138/67 | HR 63 | Temp 98.3°F | Resp 17 | Wt 170.0 lb

## 2021-10-09 DIAGNOSIS — D0512 Intraductal carcinoma in situ of left breast: Secondary | ICD-10-CM | POA: Insufficient documentation

## 2021-10-09 DIAGNOSIS — C50912 Malignant neoplasm of unspecified site of left female breast: Secondary | ICD-10-CM

## 2021-10-09 DIAGNOSIS — Z95828 Presence of other vascular implants and grafts: Secondary | ICD-10-CM

## 2021-10-09 LAB — CBC WITH DIFFERENTIAL (CANCER CENTER ONLY)
Abs Immature Granulocytes: 0.03 10*3/uL (ref 0.00–0.07)
Basophils Absolute: 0 10*3/uL (ref 0.0–0.1)
Basophils Relative: 0 %
Eosinophils Absolute: 0.1 10*3/uL (ref 0.0–0.5)
Eosinophils Relative: 2 %
HCT: 38.1 % (ref 36.0–46.0)
Hemoglobin: 12.9 g/dL (ref 12.0–15.0)
Immature Granulocytes: 1 %
Lymphocytes Relative: 24 %
Lymphs Abs: 1.1 10*3/uL (ref 0.7–4.0)
MCH: 32.7 pg (ref 26.0–34.0)
MCHC: 33.9 g/dL (ref 30.0–36.0)
MCV: 96.5 fL (ref 80.0–100.0)
Monocytes Absolute: 0.4 10*3/uL (ref 0.1–1.0)
Monocytes Relative: 9 %
Neutro Abs: 3.1 10*3/uL (ref 1.7–7.7)
Neutrophils Relative %: 64 %
Platelet Count: 123 10*3/uL — ABNORMAL LOW (ref 150–400)
RBC: 3.95 MIL/uL (ref 3.87–5.11)
RDW: 13.1 % (ref 11.5–15.5)
WBC Count: 4.8 10*3/uL (ref 4.0–10.5)
nRBC: 0 % (ref 0.0–0.2)

## 2021-10-09 LAB — IRON AND IRON BINDING CAPACITY (CC-WL,HP ONLY)
Iron: 93 ug/dL (ref 28–170)
Saturation Ratios: 32 % — ABNORMAL HIGH (ref 10.4–31.8)
TIBC: 288 ug/dL (ref 250–450)
UIBC: 195 ug/dL (ref 148–442)

## 2021-10-09 LAB — CMP (CANCER CENTER ONLY)
ALT: 13 U/L (ref 0–44)
AST: 18 U/L (ref 15–41)
Albumin: 4.2 g/dL (ref 3.5–5.0)
Alkaline Phosphatase: 73 U/L (ref 38–126)
Anion gap: 8 (ref 5–15)
BUN: 15 mg/dL (ref 8–23)
CO2: 27 mmol/L (ref 22–32)
Calcium: 9.7 mg/dL (ref 8.9–10.3)
Chloride: 105 mmol/L (ref 98–111)
Creatinine: 1.02 mg/dL — ABNORMAL HIGH (ref 0.44–1.00)
GFR, Estimated: 55 mL/min — ABNORMAL LOW (ref >60)
Glucose, Bld: 177 mg/dL — ABNORMAL HIGH (ref 70–99)
Potassium: 3.8 mmol/L (ref 3.5–5.1)
Sodium: 140 mmol/L (ref 135–145)
Total Bilirubin: 0.4 mg/dL (ref 0.3–1.2)
Total Protein: 6.5 g/dL (ref 6.5–8.1)

## 2021-10-09 LAB — FERRITIN: Ferritin: 97 ng/mL (ref 11–307)

## 2021-10-09 MED ORDER — HEPARIN SOD (PORK) LOCK FLUSH 100 UNIT/ML IV SOLN
500.0000 [IU] | Freq: Once | INTRAVENOUS | Status: AC
  Administered 2021-10-09: 500 [IU] via INTRAVENOUS

## 2021-10-09 MED ORDER — SODIUM CHLORIDE 0.9% FLUSH
10.0000 mL | Freq: Once | INTRAVENOUS | Status: AC
  Administered 2021-10-09: 10 mL via INTRAVENOUS

## 2021-10-09 NOTE — Progress Notes (Signed)
Hematology and Oncology Follow Up Visit  Carmen [name] [id number] [birth year] 81 y.o. 10/09/2021   Principle Diagnosis:  Invasive ductal carcinoma of the left breast-stage IIA (T2N0M0) -- TRIPLE NEGATIVE -- s/p LEFT lobectomy on 01/14/2021 Hemochromatosis (double heterozygote for C282Y and S65C mutations).   Current Therapy:  Phlebotomy to maintain ferritin less than 100 Adriamycin/Cytoxan - started adjuvant therapy on 02/27/2021, s/p cycle #6/6 XRT to the LEFT breast -- started on 08/06/2021 -- completed 09/23/2021   Interim History:  Ms. Samad is here today for follow-up.  She actually is doing quite well.  She has not completed all of her therapy.  She completed radiation therapy in early July.  Her hair is coming back now.  She is happy about this.  She has more energy.  She has more stamina.  She has had no nausea or vomiting.  There is been no cough or shortness of breath.  She has had no mouth sores.  She has had little bit of swelling and rash on her lower legs.  I suspect this might be more so from diabetes anything else.  She has had no fever.  She has had no headache.  She also does have hemochromatosis.  We have been holding off on doing any phlebotomies with her because of her chemotherapy.  Her last iron studieback in May showed a ferritin of 260 with iron saturation of 33%.  Currently, I would have to say that her performance status is probably ECOG 1.     Medications:  Allergies as of 10/09/2021       Reactions   Zofran [ondansetron Hcl] Other (See Comments)   Severe headache and abdominal pain   Cymbalta [duloxetine Hcl] Other (See Comments)   Profuse sweating   Farxiga [dapagliflozin] Other (See Comments)   Dizziness   Januvia [sitagliptin] Swelling   Meloxicam Nausea And Vomiting   Shellfish Allergy Other (See Comments)   Gout   Trulicity [dulaglutide] Nausea Only   Crestor [rosuvastatin] Other (See Comments)   Muscle aches on 5 mg daily and  10 mg twice weekly   Diclofenac Diarrhea   Lipitor [atorvastatin] Other (See Comments)   Muscle aches   Livalo [pitavastatin] Other (See Comments)      Sulfa Antibiotics Rash   Tape Other (See Comments)   Causes skin irritation   Zetia [ezetimibe] Other (See Comments)   Muscle aches   Zocor [simvastatin] Other (See Comments)   Muscle aches         Medication List        Accurate as of October 09, 2021 12:25 PM. If you have any questions, ask your nurse or doctor.          STOP taking these medications    HYDROcodone-acetaminophen 5-325 MG tablet Commonly known as: NORCO/VICODIN Stopped by: Volanda Napoleon, MD       TAKE these medications    Accu-Chek FastClix Lancet Kit Use to check Blood Sugars   acyclovir 400 MG tablet Commonly known as: ZOVIRAX Take 1 tablet (400 mg total) by mouth 3 (three) times daily.   allopurinol 100 MG tablet Commonly known as: ZYLOPRIM TAKE (1) TABLET TWICE A DAY.   amoxicillin 500 MG tablet Commonly known as: AMOXIL Take one hour before dental work   antiseptic oral rinse Liqd 15 mLs by Mouth Rinse route as needed for dry mouth.   aspirin 81 MG tablet Take 81 mg by mouth daily.   colchicine 0.6 MG tablet TAKE (1) TABLET DAILY  AS NEEDED.   diclofenac Sodium 1 % Gel Commonly known as: VOLTAREN APPLY 4 GRAMS TO AFFECTED AREA(S) 4 TIMES A DAY AS NEEDED   fluticasone 50 MCG/ACT nasal spray Commonly known as: FLONASE SPRAY 1 SPRAY IN EACH NOSTRIL ONCE DAILY.   furosemide 20 MG tablet Commonly known as: LASIX TAKE 1 TABLET DAILY What changed: when to take this   glipiZIDE 10 MG tablet Commonly known as: GLUCOTROL Take 2 tablets (20 mg total) by mouth daily before breakfast AND 1 tablet (10 mg total) daily before supper.   glucose blood test strip Commonly known as: ONE TOUCH ULTRA TEST CHECK BLOOD SUGAR 2 TIMES A DAY   icosapent Ethyl 1 g capsule Commonly known as: VASCEPA TAKE (2) CAPSULES TWICE DAILY.   insulin  lispro 100 UNIT/ML KwikPen Commonly known as: HumaLOG KwikPen Max Daily 25 units   Insulin Pen Needle 32G X 4 MM Misc 1 Device by Does not apply route 3 (three) times daily.   lidocaine-prilocaine cream Commonly known as: EMLA Apply 1 application topically as needed.   magic mouthwash w/lidocaine Soln Take 5 mLs by mouth 3 (three) times daily as needed for mouth pain. benadryl  525 mg, hydrocortisone 60 mg and nystatin 0.6 mg.   meclizine 12.5 MG tablet Commonly known as: ANTIVERT Take 1 tablet (12.5 mg total) by mouth 3 (three) times daily as needed for dizziness.   metoprolol tartrate 25 MG tablet Commonly known as: LOPRESSOR TAKE  (1)  TABLET TWICE A DAY.   mupirocin cream 2 % Commonly known as: Bactroban Apply 1 application topically 3 (three) times daily.   OLANZapine 10 MG tablet Commonly known as: ZYPREXA Take 1 tablet (10 mg total) by mouth at bedtime.   olmesartan 20 MG tablet Commonly known as: BENICAR Take 1 tablet (20 mg total) by mouth daily.   Praluent 150 MG/ML Soaj Generic drug: Alirocumab Inject 1 Dose into the skin every 14 (fourteen) days.   pregabalin 50 MG capsule Commonly known as: LYRICA TAKE ONE CAPSULE BY MOUTH TWICE DAILY   Restasis 0.05 % ophthalmic emulsion Generic drug: cycloSPORINE Place 2 drops into both eyes 2 (two) times daily as needed.   Rybelsus 7 MG Tabs Generic drug: Semaglutide Take 1 tablet by mouth daily before breakfast.   Vitamin D3 250 MCG (10000 UT) Tabs Take by mouth 3 (three) times a week.        Allergies:  Allergies  Allergen Reactions   Zofran [Ondansetron Hcl] Other (See Comments)    Severe headache and abdominal pain   Cymbalta [Duloxetine Hcl] Other (See Comments)    Profuse sweating   Farxiga [Dapagliflozin] Other (See Comments)    Dizziness   Januvia [Sitagliptin] Swelling   Meloxicam Nausea And Vomiting   Shellfish Allergy Other (See Comments)    Gout   Trulicity [Dulaglutide] Nausea Only    Crestor [Rosuvastatin] Other (See Comments)    Muscle aches on 5 mg daily and 10 mg twice weekly   Diclofenac Diarrhea   Lipitor [Atorvastatin] Other (See Comments)    Muscle aches   Livalo [Pitavastatin] Other (See Comments)        Sulfa Antibiotics Rash   Tape Other (See Comments)    Causes skin irritation   Zetia [Ezetimibe] Other (See Comments)    Muscle aches   Zocor [Simvastatin] Other (See Comments)    Muscle aches     Past Medical History, Surgical history, Social history, and Family History were reviewed and updated.  Review of  Systems: Review of Systems  Constitutional: Negative.   HENT:  Positive for sore throat.   Eyes: Negative.   Respiratory: Negative.    Cardiovascular: Negative.   Gastrointestinal: Negative.   Genitourinary: Negative.   Musculoskeletal: Negative.   Skin: Negative.   Neurological: Negative.   Endo/Heme/Allergies: Negative.   Psychiatric/Behavioral: Negative.       Physical Exam:  weight is 170 lb (77.1 kg). Her oral temperature is 98.3 F (36.8 C). Her blood pressure is 138/67 and her pulse is 63. Her respiration is 17 and oxygen saturation is 100%.   Wt Readings from Last 3 Encounters:  10/09/21 170 lb (77.1 kg)  10/06/21 170 lb 6 oz (77.3 kg)  08/14/21 172 lb 9.6 oz (78.3 kg)    Physical Exam Vitals reviewed.  HENT:     Head: Normocephalic and atraumatic.  Eyes:     Pupils: Pupils are equal, round, and reactive to light.  Cardiovascular:     Rate and Rhythm: Normal rate and regular rhythm.     Heart sounds: Normal heart sounds.  Pulmonary:     Effort: Pulmonary effort is normal.     Breath sounds: Normal breath sounds.  Abdominal:     General: Bowel sounds are normal.     Palpations: Abdomen is soft.  Musculoskeletal:        General: No tenderness or deformity. Normal range of motion.     Cervical back: Normal range of motion.  Lymphadenopathy:     Cervical: No cervical adenopathy.  Skin:    General: Skin is warm and  dry.     Findings: No erythema or rash.  Neurological:     Mental Status: She is alert and oriented to person, place, and time.  Psychiatric:        Behavior: Behavior normal.        Thought Content: Thought content normal.        Judgment: Judgment normal.      Lab Results  Component Value Date   WBC 4.8 10/09/2021   HGB 12.9 10/09/2021   HCT 38.1 10/09/2021   MCV 96.5 10/09/2021   PLT 123 (L) 10/09/2021   Lab Results  Component Value Date   FERRITIN 216 08/07/2021   IRON 103 08/07/2021   TIBC 309 08/07/2021   UIBC 206 08/07/2021   IRONPCTSAT 33 (H) 08/07/2021   Lab Results  Component Value Date   RETICCTPCT 3.8 (H) 06/11/2021   RBC 3.95 10/09/2021   No results found for: "KPAFRELGTCHN", "LAMBDASER", "KAPLAMBRATIO" No results found for: "IGGSERUM", "IGA", "IGMSERUM" No results found for: "TOTALPROTELP", "ALBUMINELP", "A1GS", "A2GS", "BETS", "BETA2SER", "GAMS", "MSPIKE", "SPEI"   Chemistry      Component Value Date/Time   NA 145 (H) 08/14/2021 1454   NA 147 (H) 01/22/2017 1451   NA 140 12/31/2015 1052   K 4.3 08/14/2021 1454   K 4.9 (H) 01/22/2017 1451   K 4.2 12/31/2015 1052   CL 103 08/14/2021 1454   CL 106 01/22/2017 1451   CO2 27 08/14/2021 1454   CO2 29 01/22/2017 1451   CO2 23 12/31/2015 1052   BUN 10 08/14/2021 1454   BUN 23 (H) 01/22/2017 1451   BUN 21.4 12/31/2015 1052   CREATININE 0.92 08/14/2021 1454   CREATININE 0.95 08/07/2021 1123   CREATININE 1.6 (H) 01/22/2017 1451   CREATININE 1.4 (H) 12/31/2015 1052      Component Value Date/Time   CALCIUM 9.9 08/14/2021 1454   CALCIUM 10.3 01/22/2017 1451   CALCIUM  9.7 12/31/2015 1052   ALKPHOS 76 08/14/2021 1454   ALKPHOS 65 01/22/2017 1451   ALKPHOS 87 12/31/2015 1052   AST 23 08/14/2021 1454   AST 18 08/07/2021 1123   AST 31 12/31/2015 1052   ALT 15 08/14/2021 1454   ALT 13 08/07/2021 1123   ALT 25 01/22/2017 1451   ALT 36 12/31/2015 1052   BILITOT 0.3 08/14/2021 1454   BILITOT 0.4  08/07/2021 1123   BILITOT 0.42 12/31/2015 1052       Impression and Plan: Ms. Breeland is a very pleasant 81 yo caucasian female with stage IIA infiltrating ductal carcinoma of the left breast, triple negative.   She is not doing well with all of her adjuvant therapy.  We will just watch for right now.  I would think that her risk of recurrence probably should be less than 15%.  She was very aggressive with treatment.  She took treatment.  She really did a fantastic job despite her maturity.  I am just happy that she finish the treatments and did so well.  We will have to see what her iron studies look like.  These will be important for [name].  We had to keep in mind that she does have hemochromatosis.  I think we can probably get her back now in about 3 months.   Volanda Napoleon, MD 7/20/202312:25 PM

## 2021-10-09 NOTE — Patient Instructions (Signed)

## 2021-10-10 ENCOUNTER — Telehealth: Payer: Self-pay | Admitting: *Deleted

## 2021-10-10 NOTE — Telephone Encounter (Signed)
Pt notified per order of Dr. Marin Olp that "the iron is right at the border!!  We need to do a phlebotomy."  Pt states that she would like to wait and do the phlebotomy at her next scheduled appt on 11/17/21. Dr. Marin Olp notified and message sent to scheduling.

## 2021-10-10 NOTE — Telephone Encounter (Signed)
-----   Message from Volanda Napoleon, MD sent at 10/10/2021  5:55 AM EDT ----- Call - the iron is right at the border!!  We need to do a phlebotomy.  Please set this up!!   Laurey Arrow

## 2021-10-14 ENCOUNTER — Encounter: Payer: Self-pay | Admitting: *Deleted

## 2021-10-14 NOTE — Progress Notes (Signed)
Patient has completed all her treatment and is started to return to her baseline. She has no needs at this time.   Oncology Nurse Navigator Documentation     10/14/2021    8:30 AM  Oncology Nurse Navigator Flowsheets  Navigator Follow Up Date: 11/17/2021  Navigator Follow Up Reason: Follow-up Appointment  Navigator Location [facility]  Navigator Encounter Type Appt/Treatment Plan Review  Patient Visit Type MedOnc  Treatment Phase Post-Tx Follow-up  Barriers/Navigation Needs No Barriers At This Time  Interventions None Required  Acuity Level 1-No Barriers  Support Groups/Services Friends and Family  Time Spent with Patient 15

## 2021-10-22 ENCOUNTER — Other Ambulatory Visit: Payer: Self-pay | Admitting: Family Medicine

## 2021-10-28 DIAGNOSIS — M79676 Pain in unspecified toe(s): Secondary | ICD-10-CM | POA: Diagnosis not present

## 2021-10-28 DIAGNOSIS — L84 Corns and callosities: Secondary | ICD-10-CM | POA: Diagnosis not present

## 2021-10-28 DIAGNOSIS — B351 Tinea unguium: Secondary | ICD-10-CM | POA: Diagnosis not present

## 2021-10-28 DIAGNOSIS — E1142 Type 2 diabetes mellitus with diabetic polyneuropathy: Secondary | ICD-10-CM | POA: Diagnosis not present

## 2021-11-07 ENCOUNTER — Other Ambulatory Visit: Payer: Self-pay | Admitting: Family Medicine

## 2021-11-07 DIAGNOSIS — Z951 Presence of aortocoronary bypass graft: Secondary | ICD-10-CM

## 2021-11-07 DIAGNOSIS — E1159 Type 2 diabetes mellitus with other circulatory complications: Secondary | ICD-10-CM

## 2021-11-13 ENCOUNTER — Encounter: Payer: Self-pay | Admitting: *Deleted

## 2021-11-13 DIAGNOSIS — C50912 Malignant neoplasm of unspecified site of left female breast: Secondary | ICD-10-CM

## 2021-11-13 NOTE — Progress Notes (Signed)
Patient called asking for a CBC with her phlebotomy during her appointment on Monday. She is planning on going on vacation and she wants to make sure her counts are all good before making the decision to go.   Lab appointment made and order placed.   Oncology Nurse Navigator Documentation     11/13/2021    1:00 PM  Oncology Nurse Navigator Flowsheets  Navigator Follow Up Date: 11/17/2021  Navigator Follow Up Reason: Symptom Management  Navigator Location [facility]  Navigator Encounter Type Telephone  Telephone Incoming Call  Patient Visit Type MedOnc  Treatment Phase Post-Tx Follow-up  Barriers/Navigation Needs No Barriers At This Time  Interventions Coordination of Care  Acuity Level 1-No Barriers  Coordination of Care Appts  Education Method Verbal  Support Groups/Services Friends and Family  Time Spent with Patient 15

## 2021-11-14 ENCOUNTER — Other Ambulatory Visit: Payer: Self-pay | Admitting: *Deleted

## 2021-11-14 DIAGNOSIS — C50912 Malignant neoplasm of unspecified site of left female breast: Secondary | ICD-10-CM

## 2021-11-14 DIAGNOSIS — Z95828 Presence of other vascular implants and grafts: Secondary | ICD-10-CM

## 2021-11-17 ENCOUNTER — Inpatient Hospital Stay: Payer: Medicare Other

## 2021-11-17 ENCOUNTER — Inpatient Hospital Stay: Payer: Medicare Other | Attending: Hematology & Oncology

## 2021-11-17 ENCOUNTER — Encounter: Payer: Self-pay | Admitting: *Deleted

## 2021-11-17 ENCOUNTER — Other Ambulatory Visit: Payer: Self-pay | Admitting: *Deleted

## 2021-11-17 DIAGNOSIS — C50912 Malignant neoplasm of unspecified site of left female breast: Secondary | ICD-10-CM

## 2021-11-17 DIAGNOSIS — D0512 Intraductal carcinoma in situ of left breast: Secondary | ICD-10-CM | POA: Diagnosis not present

## 2021-11-17 DIAGNOSIS — Z95828 Presence of other vascular implants and grafts: Secondary | ICD-10-CM

## 2021-11-17 LAB — CBC WITH DIFFERENTIAL (CANCER CENTER ONLY)
Abs Immature Granulocytes: 0.06 10*3/uL (ref 0.00–0.07)
Basophils Absolute: 0 10*3/uL (ref 0.0–0.1)
Basophils Relative: 1 %
Eosinophils Absolute: 0.2 10*3/uL (ref 0.0–0.5)
Eosinophils Relative: 3 %
HCT: 35.9 % — ABNORMAL LOW (ref 36.0–46.0)
Hemoglobin: 12.3 g/dL (ref 12.0–15.0)
Immature Granulocytes: 1 %
Lymphocytes Relative: 21 %
Lymphs Abs: 1.4 10*3/uL (ref 0.7–4.0)
MCH: 32.5 pg (ref 26.0–34.0)
MCHC: 34.3 g/dL (ref 30.0–36.0)
MCV: 94.7 fL (ref 80.0–100.0)
Monocytes Absolute: 0.5 10*3/uL (ref 0.1–1.0)
Monocytes Relative: 7 %
Neutro Abs: 4.5 10*3/uL (ref 1.7–7.7)
Neutrophils Relative %: 67 %
Platelet Count: 118 10*3/uL — ABNORMAL LOW (ref 150–400)
RBC: 3.79 MIL/uL — ABNORMAL LOW (ref 3.87–5.11)
RDW: 13.5 % (ref 11.5–15.5)
WBC Count: 6.5 10*3/uL (ref 4.0–10.5)
nRBC: 0 % (ref 0.0–0.2)

## 2021-11-17 LAB — COMPREHENSIVE METABOLIC PANEL
ALT: 13 U/L (ref 0–44)
AST: 17 U/L (ref 15–41)
Albumin: 4.2 g/dL (ref 3.5–5.0)
Alkaline Phosphatase: 77 U/L (ref 38–126)
Anion gap: 9 (ref 5–15)
BUN: 27 mg/dL — ABNORMAL HIGH (ref 8–23)
CO2: 28 mmol/L (ref 22–32)
Calcium: 9.5 mg/dL (ref 8.9–10.3)
Chloride: 104 mmol/L (ref 98–111)
Creatinine, Ser: 1.37 mg/dL — ABNORMAL HIGH (ref 0.44–1.00)
GFR, Estimated: 39 mL/min — ABNORMAL LOW (ref >60)
Glucose, Bld: 141 mg/dL — ABNORMAL HIGH (ref 70–99)
Potassium: 3.5 mmol/L (ref 3.5–5.1)
Sodium: 141 mmol/L (ref 135–145)
Total Bilirubin: 0.5 mg/dL (ref 0.3–1.2)
Total Protein: 6.7 g/dL (ref 6.5–8.1)
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
## 2021-11-17 NOTE — Progress Notes (Signed)
Carmen [name] presents today for phlebotomy per MD orders. Phlebotomy procedure started at 1318 and ended at 1329 500 grams removed via 19 gauge power port.  Patient observed for 30 minutes after procedure without any incident. Patient tolerated procedure well and received replacement fluids after procedure.  Patient understands to call if he has any questions or concerns post discharge.

## 2021-11-17 NOTE — Patient Instructions (Signed)

## 2021-11-17 NOTE — Patient Instructions (Signed)

## 2021-12-08 NOTE — Progress Notes (Signed)
[facility] Quality Team Note  Name: ARLEENE SETTLE Date of Birth: 19-Jul-1940 MRN: [id number] Date: 12/08/2021  [facility] Quality Team has reviewed this patient's chart, please see recommendations below:  [facility] Quality Other; (KED: Kidney Health Evaluation Gap- Patient needs Urine Albumin Creatinine Ratio Test completed for gap closure. EGFR has already been completed, Patient has upcoming appointment with [facility] 02/18/2022.)

## 2021-12-24 DIAGNOSIS — Z853 Personal history of malignant neoplasm of breast: Secondary | ICD-10-CM | POA: Diagnosis not present

## 2021-12-31 ENCOUNTER — Encounter: Payer: Self-pay | Admitting: Hematology & Oncology

## 2021-12-31 ENCOUNTER — Inpatient Hospital Stay: Payer: Medicare Other

## 2021-12-31 ENCOUNTER — Inpatient Hospital Stay: Payer: Medicare Other | Attending: Hematology & Oncology

## 2021-12-31 ENCOUNTER — Encounter: Payer: Self-pay | Admitting: *Deleted

## 2021-12-31 ENCOUNTER — Other Ambulatory Visit: Payer: Self-pay

## 2021-12-31 ENCOUNTER — Inpatient Hospital Stay (HOSPITAL_BASED_OUTPATIENT_CLINIC_OR_DEPARTMENT_OTHER): Payer: Medicare Other | Admitting: Hematology & Oncology

## 2021-12-31 VITALS — BP 141/63 | HR 62 | Temp 98.9°F | Resp 18 | Ht 63.0 in | Wt 174.0 lb

## 2021-12-31 DIAGNOSIS — R42 Dizziness and giddiness: Secondary | ICD-10-CM | POA: Diagnosis not present

## 2021-12-31 DIAGNOSIS — Z95828 Presence of other vascular implants and grafts: Secondary | ICD-10-CM

## 2021-12-31 DIAGNOSIS — I679 Cerebrovascular disease, unspecified: Secondary | ICD-10-CM | POA: Diagnosis not present

## 2021-12-31 DIAGNOSIS — Z86000 Personal history of in-situ neoplasm of breast: Secondary | ICD-10-CM | POA: Insufficient documentation

## 2021-12-31 DIAGNOSIS — E119 Type 2 diabetes mellitus without complications: Secondary | ICD-10-CM | POA: Diagnosis not present

## 2021-12-31 DIAGNOSIS — C50912 Malignant neoplasm of unspecified site of left female breast: Secondary | ICD-10-CM | POA: Diagnosis not present

## 2021-12-31 LAB — CMP (CANCER CENTER ONLY)
ALT: 15 U/L (ref 0–44)
AST: 17 U/L (ref 15–41)
Albumin: 4.1 g/dL (ref 3.5–5.0)
Alkaline Phosphatase: 77 U/L (ref 38–126)
Anion gap: 8 (ref 5–15)
BUN: 24 mg/dL — ABNORMAL HIGH (ref 8–23)
CO2: 28 mmol/L (ref 22–32)
Calcium: 9.6 mg/dL (ref 8.9–10.3)
Chloride: 104 mmol/L (ref 98–111)
Creatinine: 1.35 mg/dL — ABNORMAL HIGH (ref 0.44–1.00)
GFR, Estimated: 39 mL/min — ABNORMAL LOW (ref >60)
Glucose, Bld: 124 mg/dL — ABNORMAL HIGH (ref 70–99)
Potassium: 3.9 mmol/L (ref 3.5–5.1)
Sodium: 140 mmol/L (ref 135–145)
Total Bilirubin: 0.4 mg/dL (ref 0.3–1.2)
Total Protein: 6.7 g/dL (ref 6.5–8.1)

## 2021-12-31 LAB — RETICULOCYTES
Immature Retic Fract: 14.9 % (ref 2.3–15.9)
RBC.: 3.71 MIL/uL — ABNORMAL LOW (ref 3.87–5.11)
Retic Count, Absolute: 68.6 10*3/uL (ref 19.0–186.0)
Retic Ct Pct: 1.9 % (ref 0.4–3.1)

## 2021-12-31 LAB — CBC WITH DIFFERENTIAL (CANCER CENTER ONLY)
Abs Immature Granulocytes: 0.05 10*3/uL (ref 0.00–0.07)
Basophils Absolute: 0 10*3/uL (ref 0.0–0.1)
Basophils Relative: 1 %
Eosinophils Absolute: 0.2 10*3/uL (ref 0.0–0.5)
Eosinophils Relative: 4 %
HCT: 35.4 % — ABNORMAL LOW (ref 36.0–46.0)
Hemoglobin: 12.1 g/dL (ref 12.0–15.0)
Immature Granulocytes: 1 %
Lymphocytes Relative: 24 %
Lymphs Abs: 1.5 10*3/uL (ref 0.7–4.0)
MCH: 32.5 pg (ref 26.0–34.0)
MCHC: 34.2 g/dL (ref 30.0–36.0)
MCV: 95.2 fL (ref 80.0–100.0)
Monocytes Absolute: 0.5 10*3/uL (ref 0.1–1.0)
Monocytes Relative: 8 %
Neutro Abs: 3.9 10*3/uL (ref 1.7–7.7)
Neutrophils Relative %: 62 %
Platelet Count: 121 10*3/uL — ABNORMAL LOW (ref 150–400)
RBC: 3.72 MIL/uL — ABNORMAL LOW (ref 3.87–5.11)
RDW: 13.7 % (ref 11.5–15.5)
WBC Count: 6.2 10*3/uL (ref 4.0–10.5)
nRBC: 0 % (ref 0.0–0.2)

## 2021-12-31 LAB — FERRITIN: Ferritin: 79 ng/mL (ref 11–307)

## 2021-12-31 MED ORDER — SODIUM CHLORIDE 0.9% FLUSH
10.0000 mL | Freq: Once | INTRAVENOUS | Status: AC
  Administered 2021-12-31: 10 mL via INTRAVENOUS

## 2021-12-31 MED ORDER — HEPARIN SOD (PORK) LOCK FLUSH 100 UNIT/ML IV SOLN
500.0000 [IU] | Freq: Once | INTRAVENOUS | Status: AC
  Administered 2021-12-31: 500 [IU] via INTRAVENOUS

## 2021-12-31 NOTE — Addendum Note (Signed)
Addended by: Burney Gauze R on: 12/31/2021 03:05 PM   Modules accepted: Orders

## 2021-12-31 NOTE — Progress Notes (Signed)
Patient is here for office follow up. She has completed treatment and had a mammogram last week which was negative. She states she is "over the moon". She also reports a slow return to her normal. Her energy and hair are starting to return and while she reports that her taste buds are better, she still has some ways to go.   Oncology Nurse Navigator Documentation     12/31/2021    1:45 PM  Oncology Nurse Navigator Flowsheets  Navigator Follow Up Date: 04/02/2022  Navigator Follow Up Reason: Follow-up Appointment  Navigator Location [facility]  Navigator Encounter Type Treatment;Appt/Treatment Plan Review  Patient Visit Type MedOnc  Treatment Phase Post-Tx Follow-up  Barriers/Navigation Needs No Barriers At This Time  Interventions Psycho-Social Support  Acuity Level 1-No Barriers  Support Groups/Services Friends and Family  Time Spent with Patient 15

## 2021-12-31 NOTE — Progress Notes (Signed)
Hematology and Oncology Follow Up Visit  Carmen [name] [id number] 1940/04/08 81 y.o. 12/31/2021   Principle Diagnosis:  Invasive ductal carcinoma of the left breast-stage IIA (T2N0M0) -- TRIPLE NEGATIVE -- s/p LEFT lobectomy on 01/14/2021 Hemochromatosis (double heterozygote for C282Y and S65C mutations).   Current Therapy:  Phlebotomy to maintain ferritin less than 100 Adriamycin/Cytoxan - started adjuvant therapy on 02/27/2021, s/p cycle #6/6 XRT to the LEFT breast -- started on 08/06/2021 -- completed 09/23/2021   Interim History:  Carmen [name] is here today for follow-up.  She is doing quite nicely.  She really has had no complaints since we last saw her.  There is been no problems with nausea or vomiting.  She has had no cough or shortness of breath.  There has been no rashes.  She has had no leg swelling.  She does have diabetes.  She is trying to manage her diabetes.  Her blood sugars have been slowly trending downward which is nice to see.  We also do worry about her hemochromatosis.  When she was last here in July, her ferritin was 97 with an iron saturation of 32%.  I think we may have phlebotomized her at that time.  She has had no obvious bleeding.  Overall, I would say that her performance status is ECOG 1.      Medications:  Allergies as of 12/31/2021       Reactions   Zofran [ondansetron Hcl] Other (See Comments)   Severe headache and abdominal pain   Cymbalta [duloxetine Hcl] Other (See Comments)   Profuse sweating   Farxiga [dapagliflozin] Other (See Comments)   Dizziness   Januvia [sitagliptin] Swelling   Meloxicam Nausea And Vomiting   Shellfish Allergy Other (See Comments)   Gout   Trulicity [dulaglutide] Nausea Only   Crestor [rosuvastatin] Other (See Comments)   Muscle aches on 5 mg daily and 10 mg twice weekly   Diclofenac Diarrhea   Lipitor [atorvastatin] Other (See Comments)   Muscle aches   Livalo [pitavastatin] Other (See Comments)       Sulfa Antibiotics Rash   Tape Other (See Comments)   Causes skin irritation   Zetia [ezetimibe] Other (See Comments)   Muscle aches   Zocor [simvastatin] Other (See Comments)   Muscle aches         Medication List        Accurate as of December 31, 2021  2:26 PM. If you have any questions, ask your nurse or doctor.          Accu-Chek [employer] Kit Use to check Blood Sugars   acyclovir 400 MG tablet Commonly known as: ZOVIRAX Take 1 tablet (400 mg total) by mouth 3 (three) times daily.   allopurinol 100 MG tablet Commonly known as: ZYLOPRIM TAKE (1) TABLET TWICE A DAY.   amoxicillin 500 MG tablet Commonly known as: AMOXIL Take one hour before dental work   antiseptic oral rinse Liqd 15 mLs by Mouth Rinse route as needed for dry mouth.   aspirin 81 MG tablet Take 81 mg by mouth daily.   colchicine 0.6 MG tablet TAKE (1) TABLET DAILY AS NEEDED.   diclofenac Sodium 1 % Gel Commonly known as: VOLTAREN APPLY 4 GRAMS TO AFFECTED AREA(S) 4 TIMES A DAY AS NEEDED   fluticasone 50 MCG/ACT nasal spray Commonly known as: FLONASE SPRAY 1 SPRAY IN EACH NOSTRIL ONCE DAILY.   furosemide 20 MG tablet Commonly known as: LASIX TAKE 1 TABLET DAILY   glipiZIDE  10 MG tablet Commonly known as: GLUCOTROL Take 2 tablets (20 mg total) by mouth daily before breakfast AND 1 tablet (10 mg total) daily before supper.   glucose blood test strip Commonly known as: ONE TOUCH ULTRA TEST CHECK BLOOD SUGAR 2 TIMES A DAY   icosapent Ethyl 1 g capsule Commonly known as: VASCEPA TAKE (2) CAPSULES TWICE DAILY.   insulin lispro 100 UNIT/ML KwikPen Commonly known as: HumaLOG KwikPen Max Daily 25 units   Insulin Pen Needle 32G X 4 MM Misc 1 Device by Does not apply route 3 (three) times daily.   lidocaine-prilocaine cream Commonly known as: EMLA Apply 1 application topically as needed.   magic mouthwash w/lidocaine Soln Take 5 mLs by mouth 3 (three) times daily as needed  for mouth pain. benadryl  525 mg, hydrocortisone 60 mg and nystatin 0.6 mg.   meclizine 12.5 MG tablet Commonly known as: ANTIVERT Take 1 tablet (12.5 mg total) by mouth 3 (three) times daily as needed for dizziness.   metoprolol tartrate 25 MG tablet Commonly known as: LOPRESSOR TAKE  (1)  TABLET TWICE A DAY.   mupirocin cream 2 % Commonly known as: Bactroban Apply 1 application topically 3 (three) times daily.   OLANZapine 10 MG tablet Commonly known as: ZYPREXA Take 1 tablet (10 mg total) by mouth at bedtime.   olmesartan 20 MG tablet Commonly known as: BENICAR Take 1 tablet (20 mg total) by mouth daily.   Praluent 150 MG/ML Soaj Generic drug: Alirocumab Inject 1 Dose into the skin every 14 (fourteen) days.   pregabalin 50 MG capsule Commonly known as: LYRICA TAKE ONE CAPSULE BY MOUTH TWICE DAILY   Restasis 0.05 % ophthalmic emulsion Generic drug: cycloSPORINE Place 2 drops into both eyes 2 (two) times daily as needed.   Rybelsus 7 MG Tabs Generic drug: Semaglutide Take 1 tablet by mouth daily before breakfast.   Vitamin D3 250 MCG (10000 UT) Tabs Take by mouth 3 (three) times a week.        Allergies:  Allergies  Allergen Reactions   Zofran [Ondansetron Hcl] Other (See Comments)    Severe headache and abdominal pain   Cymbalta [Duloxetine Hcl] Other (See Comments)    Profuse sweating   Farxiga [Dapagliflozin] Other (See Comments)    Dizziness   Januvia [Sitagliptin] Swelling   Meloxicam Nausea And Vomiting   Shellfish Allergy Other (See Comments)    Gout   Trulicity [Dulaglutide] Nausea Only   Crestor [Rosuvastatin] Other (See Comments)    Muscle aches on 5 mg daily and 10 mg twice weekly   Diclofenac Diarrhea   Lipitor [Atorvastatin] Other (See Comments)    Muscle aches   Livalo [Pitavastatin] Other (See Comments)        Sulfa Antibiotics Rash   Tape Other (See Comments)    Causes skin irritation   Zetia [Ezetimibe] Other (See Comments)     Muscle aches   Zocor [Simvastatin] Other (See Comments)    Muscle aches     Past Medical History, Surgical history, Social history, and Family History were reviewed and updated.  Review of Systems: Review of Systems  Constitutional: Negative.   HENT:  Positive for sore throat.   Eyes: Negative.   Respiratory: Negative.    Cardiovascular: Negative.   Gastrointestinal: Negative.   Genitourinary: Negative.   Musculoskeletal: Negative.   Skin: Negative.   Neurological: Negative.   Endo/Heme/Allergies: Negative.   Psychiatric/Behavioral: Negative.       Physical Exam:  height is  5' 3"  (1.6 m) and weight is 174 lb (78.9 kg). Her oral temperature is 98.9 F (37.2 C). Her blood pressure is 141/63 (abnormal) and her pulse is 62. Her respiration is 18 and oxygen saturation is 95%.   Wt Readings from Last 3 Encounters:  12/31/21 174 lb (78.9 kg)  10/09/21 170 lb (77.1 kg)  10/06/21 170 lb 6 oz (77.3 kg)    Physical Exam Vitals reviewed.  HENT:     Head: Normocephalic and atraumatic.  Eyes:     Pupils: Pupils are equal, round, and reactive to light.  Cardiovascular:     Rate and Rhythm: Normal rate and regular rhythm.     Heart sounds: Normal heart sounds.  Pulmonary:     Effort: Pulmonary effort is normal.     Breath sounds: Normal breath sounds.  Abdominal:     General: Bowel sounds are normal.     Palpations: Abdomen is soft.  Musculoskeletal:        General: No tenderness or deformity. Normal range of motion.     Cervical back: Normal range of motion.  Lymphadenopathy:     Cervical: No cervical adenopathy.  Skin:    General: Skin is warm and dry.     Findings: No erythema or rash.  Neurological:     Mental Status: She is alert and oriented to person, place, and time.  Psychiatric:        Behavior: Behavior normal.        Thought Content: Thought content normal.        Judgment: Judgment normal.      Lab Results  Component Value Date   WBC 6.2 12/31/2021    HGB 12.1 12/31/2021   HCT 35.4 (L) 12/31/2021   MCV 95.2 12/31/2021   PLT 121 (L) 12/31/2021   Lab Results  Component Value Date   FERRITIN 97 10/09/2021   IRON 93 10/09/2021   TIBC 288 10/09/2021   UIBC 195 10/09/2021   IRONPCTSAT 32 (H) 10/09/2021   Lab Results  Component Value Date   RETICCTPCT 1.9 12/31/2021   RBC 3.72 (L) 12/31/2021   RBC 3.71 (L) 12/31/2021   No results found for: "KPAFRELGTCHN", "LAMBDASER", "KAPLAMBRATIO" No results found for: "IGGSERUM", "IGA", "IGMSERUM" No results found for: "TOTALPROTELP", "ALBUMINELP", "A1GS", "A2GS", "BETS", "BETA2SER", "GAMS", "MSPIKE", "SPEI"   Chemistry      Component Value Date/Time   NA 140 12/31/2021 1311   NA 145 (H) 08/14/2021 1454   NA 147 (H) 01/22/2017 1451   NA 140 12/31/2015 1052   K 3.9 12/31/2021 1311   K 4.9 (H) 01/22/2017 1451   K 4.2 12/31/2015 1052   CL 104 12/31/2021 1311   CL 106 01/22/2017 1451   CO2 28 12/31/2021 1311   CO2 29 01/22/2017 1451   CO2 23 12/31/2015 1052   BUN 24 (H) 12/31/2021 1311   BUN 10 08/14/2021 1454   BUN 23 (H) 01/22/2017 1451   BUN 21.4 12/31/2015 1052   CREATININE 1.35 (H) 12/31/2021 1311   CREATININE 1.6 (H) 01/22/2017 1451   CREATININE 1.4 (H) 12/31/2015 1052      Component Value Date/Time   CALCIUM 9.6 12/31/2021 1311   CALCIUM 10.3 01/22/2017 1451   CALCIUM 9.7 12/31/2015 1052   ALKPHOS 77 12/31/2021 1311   ALKPHOS 65 01/22/2017 1451   ALKPHOS 87 12/31/2015 1052   AST 17 12/31/2021 1311   AST 31 12/31/2015 1052   ALT 15 12/31/2021 1311   ALT 25 01/22/2017 1451  ALT 36 12/31/2015 1052   BILITOT 0.4 12/31/2021 1311   BILITOT 0.42 12/31/2015 1052       Impression and Plan: Ms. Fritzsche is a very pleasant 81 yo caucasian female with stage IIA infiltrating ductal carcinoma of the left breast, triple negative.   She completed her adjuvant chemotherapy with A/C in April 2023.  She recently had a mammogram on 12/24/2021.  Mammogram did not show any evidence of  breast cancer, bilaterally.  We are still have to watch the hemochromatosis on her.  We will see what her iron studies look like.  I think that if all goes well, we can probably get her back after the Holiday season.  I know that she will be busy enjoying the holidays with her family.    Volanda Napoleon, MD 10/11/20232:26 PM

## 2021-12-31 NOTE — Addendum Note (Signed)
Addended by: Lucile Crater on: 12/31/2021 02:04 PM   Modules accepted: Orders

## 2022-01-01 LAB — IRON AND IRON BINDING CAPACITY (CC-WL,HP ONLY)
Iron: 66 ug/dL (ref 28–170)
Saturation Ratios: 22 % (ref 10.4–31.8)
TIBC: 302 ug/dL (ref 250–450)
UIBC: 236 ug/dL (ref 148–442)

## 2022-01-02 ENCOUNTER — Encounter: Payer: Self-pay | Admitting: Family

## 2022-01-06 DIAGNOSIS — E1142 Type 2 diabetes mellitus with diabetic polyneuropathy: Secondary | ICD-10-CM | POA: Diagnosis not present

## 2022-01-06 DIAGNOSIS — L84 Corns and callosities: Secondary | ICD-10-CM | POA: Diagnosis not present

## 2022-01-06 DIAGNOSIS — B351 Tinea unguium: Secondary | ICD-10-CM | POA: Diagnosis not present

## 2022-01-06 DIAGNOSIS — M79676 Pain in unspecified toe(s): Secondary | ICD-10-CM | POA: Diagnosis not present

## 2022-01-12 DIAGNOSIS — H10013 Acute follicular conjunctivitis, bilateral: Secondary | ICD-10-CM | POA: Diagnosis not present

## 2022-01-21 ENCOUNTER — Other Ambulatory Visit: Payer: Self-pay | Admitting: Family Medicine

## 2022-01-26 NOTE — Progress Notes (Unsigned)
Name: Carmen [name]  Age/ Sex: 81 y.o., female   MRN/ DOB: [id number], [birth year]     PCP: Claretta Fraise, MD   Reason for Endocrinology Evaluation: Type 2 Diabetes Mellitus  Initial Endocrine Consultative Visit: 09/14/2018    PATIENT IDENTIFIER: Carmen [name] is a 81 y.o. female with a past medical history of HTN, PVC, neuromuscular disorder, hemochromatosis and CAD (S/P CABG) . The patient has followed with Endocrinology clinic since 09/14/2018 for consultative assistance with management of her diabetes.  DIABETIC HISTORY:  Ms. Laborde was diagnosed with T2DM in 2016. Has tried oral glycemic agents in the past,Januvia- swelling , Trulicity - nausea , metformin - elevated LFT's . Her hemoglobin A1c has ranged from 6.6% in 2016, peaking at 8.6% in 2020.  Wilder Glade stopped 10/2018 due to vertigo   Rybelsus started 2/21  SUBJECTIVE:   During the last visit (08/27/2021): A1c was 6.7%.     Today (01/27/2022): [name] is here for a follow up on diabetes management.  She checks her blood sugars 2-3 times in week  . The patient has not had hypoglycemic episodes since the last clinic visit.     She has been diagnosed with left breast ca ( invasive breast ca), She is S/P breast sx 01/14/2022. She finished chemo 07/02/2021 .  Continues to follow-up with oncology, she is on phlebotomy schedule to maintain a ferritin level of less than 100 Completed left breast radiation 09/2021  Denies nausea, vomiting or diarrhea  She dropped an object on her left great toe and now its bruised  HOME DIABETES REGIMEN:  Glipizide 10 mg 2 tabs before breakfast and 1 tablet before supper  Rybelsus 7 mg daily     METER DOWNLOAD SUMMARY: did not bring    DIABETIC COMPLICATIONS: Microvascular complications:  CKD III, neuropathy  Denies: retinopathy  Last eye exam: Completed 12/2021   Macrovascular complications:  CAD (S/P CABG) Denies: PVD, CVA     HISTORY:  Past Medical History:   Past Medical History:  Diagnosis Date   Arthritis    Asymptomatic PVCs    Bilateral shoulder pain    CAD (coronary artery disease)    Cancer ([place]) 11/2015   [id number]  right upper arm   Chronic renal insufficiency, stage 3 (moderate) (HCC)    Diabetes mellitus without complication (HCC)    Diverticulosis    Endometrial polyp    External hemorrhoids    Goals of care, counseling/discussion 02/07/2021   Gout    Hemochromatosis, hereditary ([place]) 11/24/2012   History of anemia    History of duodenal ulcer 1990   History of radiation therapy    Left breast- 08/06/21-09/04/21- Dr. Gery Pray   Hyperlipidemia    Hypertension    Internal hemorrhoids    Jaundice    age 55 or 63   Myocardial infarction ([place]) 2007   Neuromuscular disorder ([place])    peripheral neuropathy   PMB (postmenopausal bleeding)    PONV (postoperative nausea and vomiting)    Prolapse of female pelvic organs    uses pessary   Seizures ([place])    had one at Dr. Antonieta Pert office after getting blood drawn   Stage I breast cancer, left ([place]) 12/16/2020   Tick bite 08/12/2017   had 3 bites   Type 2 diabetes mellitus with hyperglycemia, without long-term current use of insulin ([place]) 01/27/2019   Vitamin D deficiency    Past Surgical History:  Past Surgical History:  Procedure Laterality Date   BREAST  LUMPECTOMY WITH RADIOACTIVE SEED AND SENTINEL LYMPH NODE BIOPSY Left 01/14/2021   Procedure: LEFT BREAST LUMPECTOMY WITH RADIOACTIVE SEED AND SENTINEL LYMPH NODE BIOPSY;  Surgeon: Jovita Kussmaul, MD;  Location: [facility];  Service: General;  Laterality: Left;   BREAST SURGERY     left breast lump--benign   CARDIAC CATHETERIZATION  06/03/2005   COLONOSCOPY  11/28/2001   CORONARY ARTERY BYPASS GRAFT  2007   x2 Dr. Roxan Hockey, LIMA to LAD, SVG to [facility] N/A 09/28/2017   Procedure: [place];  Surgeon: Molli Posey, MD;  Location: [facility];  Service: Gynecology;  Laterality: N/A;   LIPOMA EXCISION     back   LIPOMA EXCISION Left 01/14/2021   Procedure: EXCISION LEFT CHEST WALL LIPOMA;  Surgeon: Jovita Kussmaul, MD;  Location: [facility];  Service: General;  Laterality: Left;   PORTACATH PLACEMENT Right 01/14/2021   Procedure: INSERTION PORT-A-CATH;  Surgeon: Jovita Kussmaul, MD;  Location: [facility];  Service: General;  Laterality: Right;   UPPER GI ENDOSCOPY  01/20/1989   Social History:  reports that she has never smoked. She has never used smokeless tobacco. She reports current alcohol use. She reports that she does not use drugs. Family History:  Family History  Problem Relation Age of Onset   Stroke Mother    Hypertension Mother    Neuropathy Mother    Stroke Father    Hypertension Father    Diabetes Father    Cancer Sister 55       sarcoma   Arthritis Sister    Arthritis Sister    Lupus Sister    Hemachromatosis Sister    Arthritis Sister    Hemachromatosis Sister    Hemachromatosis Sister    Diverticulitis Sister    Diabetes Brother    Diabetes Brother    Diabetes Brother    Hemachromatosis Brother    Lung cancer Brother    Liver cancer Maternal Grandmother 48   Melanoma Daughter 73     HOME MEDICATIONS: Allergies as of 01/27/2022       Reactions   Zofran [ondansetron Hcl] Other (See Comments)   Severe headache and abdominal pain   Cymbalta [duloxetine Hcl] Other (See Comments)   Profuse sweating   Farxiga [dapagliflozin] Other (See Comments)   Dizziness   Januvia [sitagliptin] Swelling   Meloxicam Nausea And Vomiting   Shellfish Allergy Other (See Comments)   Gout   Trulicity [dulaglutide] Nausea Only   Crestor [rosuvastatin] Other (See Comments)   Muscle aches on 5 mg daily and 10 mg twice weekly   Diclofenac Diarrhea   Lipitor [atorvastatin] Other (See Comments)   Muscle aches   Livalo [pitavastatin] Other  (See Comments)      Sulfa Antibiotics Rash   Tape Other (See Comments)   Causes skin irritation   Zetia [ezetimibe] Other (See Comments)   Muscle aches   Zocor [simvastatin] Other (See Comments)   Muscle aches         Medication List        Accurate as of January 27, 2022  2:08 PM. If you have any questions, ask your nurse or doctor.          STOP taking these medications    amoxicillin 500 MG tablet Commonly known as: AMOXIL Stopped by: Dorita Sciara, MD   antiseptic oral rinse Liqd Stopped by: Dorita Sciara, MD  OLANZapine 10 MG tablet Commonly known as: ZYPREXA Stopped by: Dorita Sciara, MD   Restasis 0.05 % ophthalmic emulsion Generic drug: cycloSPORINE Stopped by: Dorita Sciara, MD       TAKE these medications    Accu-Chek [employer] Kit Use to check Blood Sugars   acyclovir 400 MG tablet Commonly known as: ZOVIRAX Take 1 tablet (400 mg total) by mouth 3 (three) times daily.   allopurinol 100 MG tablet Commonly known as: ZYLOPRIM TAKE (1) TABLET TWICE A DAY.   aspirin 81 MG tablet Take 81 mg by mouth daily.   colchicine 0.6 MG tablet TAKE (1) TABLET DAILY AS NEEDED.   diclofenac Sodium 1 % Gel Commonly known as: VOLTAREN APPLY 4 GRAMS TO AFFECTED AREA(S) 4 TIMES A DAY AS NEEDED   fluticasone 50 MCG/ACT nasal spray Commonly known as: FLONASE SPRAY 1 SPRAY IN EACH NOSTRIL ONCE DAILY.   furosemide 20 MG tablet Commonly known as: LASIX TAKE 1 TABLET DAILY   glipiZIDE 10 MG tablet Commonly known as: GLUCOTROL Take 2 tablets (20 mg total) by mouth daily before breakfast AND 1 tablet (10 mg total) daily before supper.   glucose blood test strip Commonly known as: ONE TOUCH ULTRA TEST CHECK BLOOD SUGAR 2 TIMES A DAY   icosapent Ethyl 1 g capsule Commonly known as: VASCEPA TAKE (2) CAPSULES TWICE DAILY.   insulin lispro 100 UNIT/ML KwikPen Commonly known as: HumaLOG KwikPen Max Daily 25 units    Insulin Pen Needle 32G X 4 MM Misc 1 Device by Does not apply route 3 (three) times daily.   lidocaine-prilocaine cream Commonly known as: EMLA Apply 1 application topically as needed.   magic mouthwash w/lidocaine Soln Take 5 mLs by mouth 3 (three) times daily as needed for mouth pain. benadryl  525 mg, hydrocortisone 60 mg and nystatin 0.6 mg.   meclizine 12.5 MG tablet Commonly known as: ANTIVERT Take 1 tablet (12.5 mg total) by mouth 3 (three) times daily as needed for dizziness.   metoprolol tartrate 25 MG tablet Commonly known as: LOPRESSOR TAKE  (1)  TABLET TWICE A DAY.   mupirocin cream 2 % Commonly known as: Bactroban Apply 1 application topically 3 (three) times daily.   olmesartan 20 MG tablet Commonly known as: BENICAR Take 1 tablet (20 mg total) by mouth daily.   Praluent 150 MG/ML Soaj Generic drug: Alirocumab Inject 1 Dose into the skin every 14 (fourteen) days.   pregabalin 50 MG capsule Commonly known as: LYRICA TAKE ONE CAPSULE BY MOUTH TWICE DAILY   Rybelsus 7 MG Tabs Generic drug: Semaglutide Take 1 tablet by mouth daily before breakfast.   Vitamin D3 250 MCG (10000 UT) Tabs Take by mouth 3 (three) times a week.         OBJECTIVE:   Vital Signs: BP 124/72 (BP Location: Left Arm, Patient Position: Sitting, Cuff Size: Large)   Pulse 70   Ht _0  (1.6 m)   Wt 174 lb (78.9 kg)   SpO2 94%   BMI 30.82 kg/m   Wt Readings from Last 3 Encounters:  01/27/22 174 lb (78.9 kg)  12/31/21 174 lb (78.9 kg)  10/09/21 170 lb (77.1 kg)     Exam: General: Pt appears well and is in NAD  Lungs: Clear with good BS bilat   Heart: RRR   Extremities: Trace  pretibial edema.  Neuro: MS is good with appropriate affect, pt is alert and Ox3     DM foot exam: 01/27/2022  The skin of the feet is  without sores or ulcerations, there's a blood blister at the tip of the left great toe nail  The pedal pulses are 2+ on right and 2+ on left. The sensation is  decreased to a screening 5.07, 10 gram monofilament bilaterally   Latest Reference Range & Units 12/31/21 13:11  Sodium 135 - 145 mmol/L 140  Potassium 3.5 - 5.1 mmol/L 3.9  Chloride 98 - 111 mmol/L 104  CO2 22 - 32 mmol/L 28  Glucose 70 - 99 mg/dL 124 (H)  BUN 8 - 23 mg/dL 24 (H)  Creatinine 0.44 - 1.00 mg/dL 1.35 (H)  Calcium 8.9 - 10.3 mg/dL 9.6  Anion gap 5 - 15  8  Alkaline Phosphatase 38 - 126 U/L 77  Albumin 3.5 - 5.0 g/dL 4.1  AST 15 - 41 U/L 17  ALT 0 - 44 U/L 15  Total Protein 6.5 - 8.1 g/dL 6.7  Total Bilirubin 0.3 - 1.2 mg/dL 0.4  GFR, Est Non African American >60 mL/min 39 (L)    DATA REVIEWED:  Lab Results  Component Value Date   HGBA1C 5.8 (A) 01/27/2022   HGBA1C 6.7 (A) 07/24/2021   HGBA1C 6.5 (A) 12/26/2020         In office BG 105 mg/dL   ASSESSMENT / PLAN / RECOMMENDATIONS:   1) Type 2 Diabetes Mellitus, Optimally  controlled, With neuropathic and CKD III complications - Most recent A1c of 5.8 %. Goal A1c < 7.5 %.    -A1c is trending down, will reduce glipizide as below   MEDICATIONS: -Decrease glipizide 10 mg , 1 tab before  Breakfast and 1 tablet before  Supper  - Continue  Rybelsus 7 mg daily with breakfast    EDUCATION / INSTRUCTIONS: BG monitoring instructions: Patient is instructed to check her blood sugars 3 times a week fasting . Call [facility] if: BG persistently < 70  I reviewed the Rule of 15 for the treatment of hypoglycemia in detail with the patient. Literature supplied.   2) Toe injury:  -She has hematoma at the tip of the left great toe, she was warned that this could eventually rupture and blood will use -I have advised the patient to monitor the toe daily and avoid any injury and to monitor for signs of infection -Reassurance provided at this time  F/U in 6 months    Signed electronically by: Mack Guise, MD  [facility]  [facility] [address] [place], [place] [address] Phone: [phone] FAX: [phone]   CC: [address], [place] [address] Phone: [phone]  Fax: [phone]  Return to Endocrinology clinic as below: Future Appointments  Date Time Provider [place]  02/10/2022  1:00 PM [facility] INJ NURSE [facility] None  02/18/2022 12:55 PM Claretta Fraise, MD [facility] None  04/02/2022 11:30 AM [facility] LAB [facility] None  04/02/2022 11:45 AM [facility] INJ NURSE [facility] None  04/02/2022 12:00 PM Ennever, Rudell Cobb, MD [facility] None  04/15/2022 11:15 AM [facility]NUAL WELLNESS VISIT [facility] None

## 2022-01-27 ENCOUNTER — Ambulatory Visit (INDEPENDENT_AMBULATORY_CARE_PROVIDER_SITE_OTHER): Payer: Medicare Other | Admitting: Internal Medicine

## 2022-01-27 ENCOUNTER — Encounter: Payer: Self-pay | Admitting: Internal Medicine

## 2022-01-27 VITALS — BP 124/72 | HR 70 | Ht 63.0 in | Wt 174.0 lb

## 2022-01-27 DIAGNOSIS — S99922A Unspecified injury of left foot, initial encounter: Secondary | ICD-10-CM

## 2022-01-27 DIAGNOSIS — E1122 Type 2 diabetes mellitus with diabetic chronic kidney disease: Secondary | ICD-10-CM | POA: Diagnosis not present

## 2022-01-27 DIAGNOSIS — N1832 Chronic kidney disease, stage 3b: Secondary | ICD-10-CM | POA: Diagnosis not present

## 2022-01-27 DIAGNOSIS — E1142 Type 2 diabetes mellitus with diabetic polyneuropathy: Secondary | ICD-10-CM | POA: Diagnosis not present

## 2022-01-27 LAB — POCT GLUCOSE (DEVICE FOR HOME USE): POC Glucose: 105 mg/dl — AB (ref 70–99)

## 2022-01-27 LAB — POCT GLYCOSYLATED HEMOGLOBIN (HGB A1C): Hemoglobin A1C: 5.8 % — AB (ref 4.0–5.6)

## 2022-01-27 MED ORDER — GLIPIZIDE 10 MG PO TABS: 10.0000 mg | ORAL_TABLET | Freq: Two times a day (BID) | ORAL | 3 refills | Status: DC

## 2022-01-27 MED ORDER — RYBELSUS 7 MG PO TABS: 1.0000 | ORAL_TABLET | Freq: Every day | ORAL | 3 refills | Status: DC

## 2022-01-27 NOTE — Patient Instructions (Signed)
-   Decrease  Glipizide 10 mg, 1 tablet before breakfast and 1 tablet before supper   - Continue  Rybelsus 7 mg daily with Breakfast      - HOW TO TREAT LOW BLOOD SUGARS (Blood sugar LESS THAN 70 MG/DL) Please follow the RULE OF 15 for the treatment of hypoglycemia treatment (when your (blood sugars are less than 70 mg/dL)   STEP 1: Take 15 grams of carbohydrates when your blood sugar is low, which includes:  3-4 GLUCOSE TABS  OR 3-4 OZ OF JUICE OR REGULAR SODA OR ONE TUBE OF GLUCOSE GEL    STEP 2: RECHECK blood sugar in 15 MINUTES STEP 3: If your blood sugar is still low at the 15 minute recheck --> then, go back to STEP 1 and treat AGAIN with another 15 grams of carbohydrates.

## 2022-02-03 DIAGNOSIS — C50512 Malignant neoplasm of lower-outer quadrant of left female breast: Secondary | ICD-10-CM | POA: Diagnosis not present

## 2022-02-03 DIAGNOSIS — Z171 Estrogen receptor negative status [ER-]: Secondary | ICD-10-CM | POA: Diagnosis not present

## 2022-02-05 ENCOUNTER — Other Ambulatory Visit: Payer: Self-pay | Admitting: Family Medicine

## 2022-02-05 DIAGNOSIS — E1159 Type 2 diabetes mellitus with other circulatory complications: Secondary | ICD-10-CM

## 2022-02-05 DIAGNOSIS — Z951 Presence of aortocoronary bypass graft: Secondary | ICD-10-CM

## 2022-02-10 ENCOUNTER — Other Ambulatory Visit: Payer: Self-pay | Admitting: Family Medicine

## 2022-02-10 ENCOUNTER — Inpatient Hospital Stay: Payer: Medicare Other

## 2022-02-10 DIAGNOSIS — E1140 Type 2 diabetes mellitus with diabetic neuropathy, unspecified: Secondary | ICD-10-CM

## 2022-02-10 MED ORDER — PREGABALIN 50 MG PO CAPS: ORAL_CAPSULE | ORAL | 0 refills | Status: DC

## 2022-02-10 NOTE — Telephone Encounter (Signed)
Informed pt that refill would have to be done at her 02/18/22 visit She wants to know if she could get 3-4 days worth to get her to her appt. Her appt was made when she left on 08/14/21 and she said that it ws a schedule issue.

## 2022-02-10 NOTE — Telephone Encounter (Signed)
  Prescription Request  02/10/2022  Is this a "Controlled Substance" medicine?   Have you seen your PCP in the last 2 weeks? Appt 11/29  If YES, route message to pool  -  If NO, patient needs to be scheduled for appointment.  What is the name of the medication or equipment? pregabalin (LYRICA) 50 MG capsule   Have you contacted your pharmacy to request a refill? YES   Which pharmacy would you like this sent to? [facility]   Patient notified that their request is being sent to the clinical staff for review and that they should receive a response within 2 business days.

## 2022-02-18 ENCOUNTER — Inpatient Hospital Stay: Payer: Medicare Other | Attending: Hematology & Oncology

## 2022-02-18 ENCOUNTER — Encounter: Payer: Self-pay | Admitting: Family Medicine

## 2022-02-18 ENCOUNTER — Ambulatory Visit (INDEPENDENT_AMBULATORY_CARE_PROVIDER_SITE_OTHER): Payer: Medicare Other | Admitting: Family Medicine

## 2022-02-18 VITALS — BP 189/70 | HR 69 | Temp 98.2°F | Resp 17

## 2022-02-18 VITALS — BP 146/85 | HR 74 | Temp 97.7°F | Ht 63.0 in | Wt 176.6 lb

## 2022-02-18 DIAGNOSIS — Z95828 Presence of other vascular implants and grafts: Secondary | ICD-10-CM

## 2022-02-18 DIAGNOSIS — Z951 Presence of aortocoronary bypass graft: Secondary | ICD-10-CM

## 2022-02-18 DIAGNOSIS — I152 Hypertension secondary to endocrine disorders: Secondary | ICD-10-CM

## 2022-02-18 DIAGNOSIS — Z853 Personal history of malignant neoplasm of breast: Secondary | ICD-10-CM | POA: Diagnosis not present

## 2022-02-18 DIAGNOSIS — E782 Mixed hyperlipidemia: Secondary | ICD-10-CM | POA: Diagnosis not present

## 2022-02-18 DIAGNOSIS — E1140 Type 2 diabetes mellitus with diabetic neuropathy, unspecified: Secondary | ICD-10-CM | POA: Diagnosis not present

## 2022-02-18 DIAGNOSIS — E1159 Type 2 diabetes mellitus with other circulatory complications: Secondary | ICD-10-CM | POA: Diagnosis not present

## 2022-02-18 DIAGNOSIS — Z23 Encounter for immunization: Secondary | ICD-10-CM | POA: Diagnosis not present

## 2022-02-18 MED ORDER — HEPARIN SOD (PORK) LOCK FLUSH 100 UNIT/ML IV SOLN
500.0000 [IU] | Freq: Once | INTRAVENOUS | Status: AC
  Administered 2022-02-18: 500 [IU] via INTRAVENOUS

## 2022-02-18 MED ORDER — PREGABALIN 50 MG PO CAPS: ORAL_CAPSULE | ORAL | 1 refills | Status: DC

## 2022-02-18 MED ORDER — SODIUM CHLORIDE 0.9% FLUSH
10.0000 mL | Freq: Once | INTRAVENOUS | Status: AC
  Administered 2022-02-18: 10 mL via INTRAVENOUS

## 2022-02-18 MED ORDER — ICOSAPENT ETHYL 1 G PO CAPS: ORAL_CAPSULE | ORAL | 3 refills | Status: DC

## 2022-02-18 MED ORDER — DICLOFENAC SODIUM 1 % EX GEL: CUTANEOUS | 5 refills | Status: DC

## 2022-02-18 MED ORDER — FUROSEMIDE 20 MG PO TABS: 20.0000 mg | ORAL_TABLET | Freq: Every day | ORAL | 3 refills | Status: DC

## 2022-02-18 MED ORDER — OLMESARTAN MEDOXOMIL 20 MG PO TABS: 20.0000 mg | ORAL_TABLET | Freq: Every day | ORAL | 3 refills | Status: DC

## 2022-02-18 MED ORDER — NEOMYCIN-POLYMYXIN-HC 3.5-10000-1 OT SOLN: 4.0000 [drp] | Freq: Four times a day (QID) | OTIC | 0 refills | Status: DC

## 2022-02-18 NOTE — Progress Notes (Signed)
Subjective:  Patient ID: Carmen [name],  female    DOB: 12-01-1940  Age: 81 y.o.    CC: Medical Management of Chronic Issues   HPI Carmen [name] presents for  follow-up of hypertension. Patient has no history of headache chest pain or shortness of breath or recent cough. Patient also denies symptoms of TIA such as numbness weakness lateralizing. Patient denies side effects from medication. States taking it regularly.  Patient also  in for follow-up of elevated cholesterol. Doing well without complaints on current medication. Denies side effects  including myalgia and arthralgia and nausea. Also in today for liver function testing. Currently no chest pain, shortness of breath or other cardiovascular related symptoms noted. Pregabalin controlling burning of peripheral neuropathy. Present since 2000. Actually had it before she developed diabetes. Some burning in hands. Helps with sleep too.   History Carmen [name] has a past medical history of Arthritis, Asymptomatic PVCs, Bilateral shoulder pain, CAD (coronary artery disease), Cancer ([place]) (11/2015), Chronic renal insufficiency, stage 3 (moderate) ([place]), Diabetes mellitus without complication ([place]), Diverticulosis, Endometrial polyp, External hemorrhoids, Goals of care, counseling/discussion (02/07/2021), Gout, Hemochromatosis, hereditary ([place]) (11/24/2012), History of anemia, History of duodenal ulcer (1990), History of radiation therapy, Hyperlipidemia, Hypertension, Internal hemorrhoids, Jaundice, Myocardial infarction ([place]) (2007), Neuromuscular disorder ([place]), PMB (postmenopausal bleeding), PONV (postoperative nausea and vomiting), Prolapse of female pelvic organs, Seizures ([place]), Stage I breast cancer, left ([place]) (12/16/2020), Tick bite (08/12/2017), Type 2 diabetes mellitus with hyperglycemia, without long-term current use of insulin ([place]) (01/27/2019), and Vitamin D deficiency.   She has a past surgical history that includes Lipoma  excision; Breast surgery; Cardiac catheterization (06/03/2005); Colonoscopy (11/28/2001); Upper gi endoscopy (01/20/1989); Coronary artery bypass graft (2007); Dilatation & curettage/hysteroscopy with myosure (N/A, 09/28/2017); Breast lumpectomy with radioactive seed and sentinel lymph node biopsy (Left, 01/14/2021); Portacath placement (Right, 01/14/2021); and Lipoma excision (Left, 01/14/2021).   Her family history includes Arthritis in her sister, sister, and sister; Cancer (age of onset: 46) in her sister; Diabetes in her brother, brother, brother, and father; Diverticulitis in her sister; Hemachromatosis in her brother, sister, sister, and sister; Hypertension in her father and mother; Liver cancer (age of onset: 31) in her maternal grandmother; Lung cancer in her brother; Lupus in her sister; Melanoma (age of onset: 49) in her daughter; Neuropathy in her mother; Stroke in her father and mother.She reports that she has never smoked. She has never used smokeless tobacco. She reports current alcohol use. She reports that she does not use drugs.  Current Outpatient Medications on File Prior to Visit  Medication Sig Dispense Refill   acyclovir (ZOVIRAX) 400 MG tablet Take 1 tablet (400 mg total) by mouth 3 (three) times daily. 90 tablet 1   allopurinol (ZYLOPRIM) 100 MG tablet TAKE (1) TABLET TWICE A DAY. 180 tablet 0   aspirin 81 MG tablet Take 81 mg by mouth daily.     Cholecalciferol (VITAMIN D3) 250 MCG (10000 UT) TABS Take by mouth 3 (three) times a week.     colchicine 0.6 MG tablet TAKE (1) TABLET DAILY AS NEEDED. 30 tablet 1   fluticasone (FLONASE) 50 MCG/ACT nasal spray SPRAY 1 SPRAY IN EACH NOSTRIL ONCE DAILY. 16 g 4   glipiZIDE (GLUCOTROL) 10 MG tablet Take 1 tablet (10 mg total) by mouth 2 (two) times daily before a meal. 180 tablet 3   glucose blood (ONE TOUCH ULTRA TEST) test strip CHECK BLOOD SUGAR 2 TIMES A DAY 100 each prn   insulin lispro (HUMALOG  KWIKPEN) 100 UNIT/ML KwikPen Max Daily  25 units 15 mL 11   Insulin Pen Needle 32G X 4 MM MISC 1 Device by Does not apply route 3 (three) times daily. 300 each 1   Lancets Misc. (ACCU-CHEK FASTCLIX LANCET) KIT Use to check Blood Sugars 1 kit 0   lidocaine-prilocaine (EMLA) cream Apply 1 application topically as needed. 30 g 0   magic mouthwash w/lidocaine SOLN Take 5 mLs by mouth 3 (three) times daily as needed for mouth pain. benadryl  525 mg, hydrocortisone 60 mg and nystatin 0.6 mg. 240 mL 6   meclizine (ANTIVERT) 12.5 MG tablet Take 1 tablet (12.5 mg total) by mouth 3 (three) times daily as needed for dizziness. 30 tablet 5   metoprolol tartrate (LOPRESSOR) 25 MG tablet TAKE  (1)  TABLET TWICE A DAY. 180 tablet 3   mupirocin cream (BACTROBAN) 2 % Apply 1 application topically 3 (three) times daily. 30 g 1   PRALUENT 150 MG/ML SOAJ Inject 1 Dose into the skin every 14 (fourteen) days. 2 mL 11   Semaglutide (RYBELSUS) 7 MG TABS Take 1 tablet by mouth daily before breakfast. 90 tablet 3   [DISCONTINUED] prochlorperazine (COMPAZINE) 10 MG tablet Take 1 tablet (10 mg total) by mouth every 6 (six) hours as needed (Nausea or vomiting). 30 tablet 1   No current facility-administered medications on file prior to visit.    ROS Review of Systems  Constitutional: Negative.   HENT: Negative.    Eyes:  Negative for visual disturbance.  Respiratory:  Negative for shortness of breath.   Cardiovascular:  Negative for chest pain.  Gastrointestinal:  Negative for abdominal pain.  Musculoskeletal:  Negative for arthralgias.    Objective:  BP (!) 146/85   Pulse 74   Temp 97.7 F (36.5 C)   Ht _0  (1.6 m)   Wt 176 lb 9.6 oz (80.1 kg)   SpO2 97%   BMI 31.28 kg/m   BP Readings from Last 3 Encounters:  02/18/22 (!) 189/70  02/18/22 (!) 146/85  01/27/22 124/72    Wt Readings from Last 3 Encounters:  02/18/22 176 lb 9.6 oz (80.1 kg)  01/27/22 174 lb (78.9 kg)  12/31/21 174 lb (78.9 kg)     Physical Exam Constitutional:       General: She is not in acute distress.    Appearance: She is well-developed.  HENT:     Head: Normocephalic and atraumatic.  Eyes:     Conjunctiva/sclera: Conjunctivae normal.     Pupils: Pupils are equal, round, and reactive to light.  Neck:     Thyroid: No thyromegaly.  Cardiovascular:     Rate and Rhythm: Normal rate and regular rhythm.     Heart sounds: Normal heart sounds. No murmur heard. Pulmonary:     Effort: Pulmonary effort is normal. No respiratory distress.     Breath sounds: Normal breath sounds. No wheezing or rales.  Abdominal:     General: Bowel sounds are normal. There is no distension.     Palpations: Abdomen is soft.     Tenderness: There is no abdominal tenderness.  Musculoskeletal:        General: Normal range of motion.     Cervical back: Normal range of motion and neck supple.  Lymphadenopathy:     Cervical: No cervical adenopathy.  Skin:    General: Skin is warm and dry.  Neurological:     Mental Status: She is alert and oriented to person, place,  and time.  Psychiatric:        Behavior: Behavior normal.        Thought Content: Thought content normal.        Judgment: Judgment normal.     Diabetic Foot Exam - Simple   No data filed     Lab Results  Component Value Date   HGBA1C 5.8 (A) 01/27/2022   HGBA1C 6.7 (A) 07/24/2021   HGBA1C 6.5 (A) 12/26/2020    Assessment & Plan:   Carmen [name] was seen today for medical management of chronic issues.  Diagnoses and all orders for this visit:  Mixed hyperlipidemia -     Lipid panel -     icosapent Ethyl (VASCEPA) 1 g capsule; TAKE (2) CAPSULES TWICE DAILY.  Type 2 diabetes mellitus with diabetic neuropathy, without long-term current use of insulin ([facility]) -     Microalbumin / creatinine urine ratio -     pregabalin (LYRICA) 50 MG capsule; TAKE ONE CAPSULE BY MOUTH TWICE DAILY  S/P CABG x 2 -     furosemide (LASIX) 20 MG tablet; Take 1 tablet (20 mg total) by mouth daily.  Hypertension  associated with diabetes ([place]) -     furosemide (LASIX) 20 MG tablet; Take 1 tablet (20 mg total) by mouth daily.  Need for immunization against influenza -     Flu Vaccine QUAD High Dose(Fluad)  Other orders -     olmesartan (BENICAR) 20 MG tablet; Take 1 tablet (20 mg total) by mouth daily. -     diclofenac Sodium (VOLTAREN) 1 % GEL; APPLY 4 GRAMS TO AFFECTED AREA(S) 4 TIMES A DAY AS NEEDED -     neomycin-polymyxin-hydrocortisone (CORTISPORIN) OTIC solution; Place 4 drops into both ears 4 (four) times daily.   I have changed Carmen [name]'s furosemide. I am also having her start on neomycin-polymyxin-hydrocortisone. Additionally, I am having her maintain her aspirin, Accu-Chek FastClix Lancet, glucose blood, fluticasone, colchicine, meclizine, metoprolol tartrate, Vitamin D3, lidocaine-prilocaine, Insulin Pen Needle, insulin lispro, mupirocin cream, magic mouthwash w/lidocaine, acyclovir, Praluent, allopurinol, glipiZIDE, Rybelsus, icosapent Ethyl, olmesartan, pregabalin, and diclofenac Sodium.  Meds ordered this encounter  Medications   furosemide (LASIX) 20 MG tablet    Sig: Take 1 tablet (20 mg total) by mouth daily.    Dispense:  90 tablet    Refill:  3   icosapent Ethyl (VASCEPA) 1 g capsule    Sig: TAKE (2) CAPSULES TWICE DAILY.    Dispense:  360 capsule    Refill:  3   olmesartan (BENICAR) 20 MG tablet    Sig: Take 1 tablet (20 mg total) by mouth daily.    Dispense:  90 tablet    Refill:  3   pregabalin (LYRICA) 50 MG capsule    Sig: TAKE ONE CAPSULE BY MOUTH TWICE DAILY    Dispense:  180 capsule    Refill:  1   diclofenac Sodium (VOLTAREN) 1 % GEL    Sig: APPLY 4 GRAMS TO AFFECTED AREA(S) 4 TIMES A DAY AS NEEDED    Dispense:  350 g    Refill:  5   neomycin-polymyxin-hydrocortisone (CORTISPORIN) OTIC solution    Sig: Place 4 drops into both ears 4 (four) times daily.    Dispense:  10 mL    Refill:  0     Follow-up: Return in about 3 months (around  05/21/2022).  Claretta Fraise, M.D.

## 2022-02-20 LAB — MICROALBUMIN / CREATININE URINE RATIO
Creatinine, Urine: 115.6 mg/dL
Microalb/Creat Ratio: 18 mg/g creat (ref 0–29)
Microalbumin, Urine: 21 ug/mL

## 2022-03-24 ENCOUNTER — Other Ambulatory Visit: Payer: Self-pay | Admitting: Family Medicine

## 2022-03-24 DIAGNOSIS — L84 Corns and callosities: Secondary | ICD-10-CM | POA: Diagnosis not present

## 2022-03-24 DIAGNOSIS — E1142 Type 2 diabetes mellitus with diabetic polyneuropathy: Secondary | ICD-10-CM | POA: Diagnosis not present

## 2022-03-24 DIAGNOSIS — M79676 Pain in unspecified toe(s): Secondary | ICD-10-CM | POA: Diagnosis not present

## 2022-03-24 DIAGNOSIS — B351 Tinea unguium: Secondary | ICD-10-CM | POA: Diagnosis not present

## 2022-04-02 ENCOUNTER — Encounter: Payer: Self-pay | Admitting: Hematology & Oncology

## 2022-04-02 ENCOUNTER — Other Ambulatory Visit: Payer: Self-pay

## 2022-04-02 ENCOUNTER — Inpatient Hospital Stay: Payer: Medicare Other

## 2022-04-02 ENCOUNTER — Encounter: Payer: Self-pay | Admitting: *Deleted

## 2022-04-02 ENCOUNTER — Inpatient Hospital Stay (HOSPITAL_BASED_OUTPATIENT_CLINIC_OR_DEPARTMENT_OTHER): Payer: Medicare Other | Admitting: Hematology & Oncology

## 2022-04-02 ENCOUNTER — Inpatient Hospital Stay: Payer: Medicare Other | Attending: Hematology & Oncology

## 2022-04-02 VITALS — BP 131/64 | HR 87 | Temp 98.4°F | Resp 17 | Ht 63.0 in | Wt 174.0 lb

## 2022-04-02 DIAGNOSIS — Z853 Personal history of malignant neoplasm of breast: Secondary | ICD-10-CM | POA: Diagnosis not present

## 2022-04-02 DIAGNOSIS — C50912 Malignant neoplasm of unspecified site of left female breast: Secondary | ICD-10-CM | POA: Diagnosis not present

## 2022-04-02 DIAGNOSIS — E1140 Type 2 diabetes mellitus with diabetic neuropathy, unspecified: Secondary | ICD-10-CM | POA: Insufficient documentation

## 2022-04-02 DIAGNOSIS — Z95828 Presence of other vascular implants and grafts: Secondary | ICD-10-CM

## 2022-04-02 LAB — CBC WITH DIFFERENTIAL (CANCER CENTER ONLY)
Abs Immature Granulocytes: 0.06 10*3/uL (ref 0.00–0.07)
Basophils Absolute: 0 10*3/uL (ref 0.0–0.1)
Basophils Relative: 1 %
Eosinophils Absolute: 0.2 10*3/uL (ref 0.0–0.5)
Eosinophils Relative: 4 %
HCT: 37.9 % (ref 36.0–46.0)
Hemoglobin: 12.8 g/dL (ref 12.0–15.0)
Immature Granulocytes: 1 %
Lymphocytes Relative: 23 %
Lymphs Abs: 1.4 10*3/uL (ref 0.7–4.0)
MCH: 31.9 pg (ref 26.0–34.0)
MCHC: 33.8 g/dL (ref 30.0–36.0)
MCV: 94.5 fL (ref 80.0–100.0)
Monocytes Absolute: 0.5 10*3/uL (ref 0.1–1.0)
Monocytes Relative: 8 %
Neutro Abs: 3.9 10*3/uL (ref 1.7–7.7)
Neutrophils Relative %: 63 %
Platelet Count: 133 10*3/uL — ABNORMAL LOW (ref 150–400)
RBC: 4.01 MIL/uL (ref 3.87–5.11)
RDW: 14.1 % (ref 11.5–15.5)
WBC Count: 6.1 10*3/uL (ref 4.0–10.5)
nRBC: 0 % (ref 0.0–0.2)

## 2022-04-02 LAB — IRON AND IRON BINDING CAPACITY (CC-WL,HP ONLY)
Iron: 139 ug/dL (ref 28–170)
Saturation Ratios: 47 % — ABNORMAL HIGH (ref 10.4–31.8)
TIBC: 298 ug/dL (ref 250–450)
UIBC: 159 ug/dL (ref 148–442)

## 2022-04-02 LAB — CMP (CANCER CENTER ONLY)
ALT: 15 U/L (ref 0–44)
AST: 18 U/L (ref 15–41)
Albumin: 4.2 g/dL (ref 3.5–5.0)
Alkaline Phosphatase: 82 U/L (ref 38–126)
Anion gap: 10 (ref 5–15)
BUN: 29 mg/dL — ABNORMAL HIGH (ref 8–23)
CO2: 27 mmol/L (ref 22–32)
Calcium: 9.9 mg/dL (ref 8.9–10.3)
Chloride: 103 mmol/L (ref 98–111)
Creatinine: 1.34 mg/dL — ABNORMAL HIGH (ref 0.44–1.00)
GFR, Estimated: 40 mL/min — ABNORMAL LOW (ref >60)
Glucose, Bld: 153 mg/dL — ABNORMAL HIGH (ref 70–99)
Potassium: 3.7 mmol/L (ref 3.5–5.1)
Sodium: 140 mmol/L (ref 135–145)
Total Bilirubin: 0.5 mg/dL (ref 0.3–1.2)
Total Protein: 6.8 g/dL (ref 6.5–8.1)

## 2022-04-02 LAB — FERRITIN: Ferritin: 86 ng/mL (ref 11–307)

## 2022-04-02 LAB — LACTATE DEHYDROGENASE: LDH: 164 U/L (ref 98–192)

## 2022-04-02 MED ORDER — SODIUM CHLORIDE 0.9% FLUSH
10.0000 mL | Freq: Once | INTRAVENOUS | Status: AC
  Administered 2022-04-02: 10 mL via INTRAVENOUS

## 2022-04-02 MED ORDER — HEPARIN SOD (PORK) LOCK FLUSH 100 UNIT/ML IV SOLN
500.0000 [IU] | Freq: Once | INTRAVENOUS | Status: AC
  Administered 2022-04-02: 500 [IU] via INTRAVENOUS

## 2022-04-02 NOTE — Progress Notes (Signed)
Hematology and Oncology Follow Up Visit  Carmen [name] [id number] 1940/04/11 82 y.o. 04/02/2022   Principle Diagnosis:  Invasive ductal carcinoma of the left breast-stage IIA (T2N0M0) -- TRIPLE NEGATIVE -- s/p LEFT lobectomy on 01/14/2021 Hemochromatosis (double heterozygote for C282Y and S65C mutations).   Current Therapy:  Phlebotomy to maintain ferritin less than 100 Adriamycin/Cytoxan - started adjuvant therapy on 02/27/2021, s/p cycle #6/6 XRT to the LEFT breast -- started on 08/06/2021 -- completed 09/23/2021   Interim History:  Carmen [name] is here today for follow-up.  We last saw her back in October.  At that time, we checked her iron studies.  Her ferritin was 79 with an iron saturation of 22%.  She is complaining of some tingling down the right arm.  I suspect this might be coming from her neck.  She probably does have an MRI of the cervical spine to see what might be going on.  Patient also has some neuropathy from her diabetes.  She is on Lyrica.  She has had no issues with bowels or bladder.  She has had no diarrhea.  She has had no nausea or vomiting.  She did have a very nice Holiday season with her family.  Her family came over to her house.  She has had no issues with fever.  She is still being very diligent with watching for the influenza virus and the COVID virus.  She has had no cough or shortness of breath.  Overall, I would say performance status is probably ECOG 1.     Medications:  Allergies as of 04/02/2022       Reactions   Zofran [ondansetron Hcl] Other (See Comments)   Severe headache and abdominal pain   Cymbalta [duloxetine Hcl] Other (See Comments)   Profuse sweating   Farxiga [dapagliflozin] Other (See Comments)   Dizziness   Januvia [sitagliptin] Swelling   Meloxicam Nausea And Vomiting   Shellfish Allergy Other (See Comments)   Gout   Trulicity [dulaglutide] Nausea Only   Crestor [rosuvastatin] Other (See Comments)   Muscle aches  on 5 mg daily and 10 mg twice weekly   Diclofenac Diarrhea   Lipitor [atorvastatin] Other (See Comments)   Muscle aches   Livalo [pitavastatin] Other (See Comments)      Sulfa Antibiotics Rash   Tape Other (See Comments)   Causes skin irritation   Zetia [ezetimibe] Other (See Comments)   Muscle aches   Zocor [simvastatin] Other (See Comments)   Muscle aches         Medication List        Accurate as of April 02, 2022  1:00 PM. If you have any questions, ask your nurse or doctor.          Accu-Chek [employer] Kit Use to check Blood Sugars   acyclovir 400 MG tablet Commonly known as: ZOVIRAX Take 1 tablet (400 mg total) by mouth 3 (three) times daily.   allopurinol 100 MG tablet Commonly known as: ZYLOPRIM TAKE (1) TABLET TWICE A DAY.   aspirin 81 MG tablet Take 81 mg by mouth daily.   colchicine 0.6 MG tablet TAKE (1) TABLET DAILY AS NEEDED.   diclofenac Sodium 1 % Gel Commonly known as: VOLTAREN APPLY 4 GRAMS TO AFFECTED AREA(S) 4 TIMES A DAY AS NEEDED   fluticasone 50 MCG/ACT nasal spray Commonly known as: FLONASE SPRAY 1 SPRAY IN EACH NOSTRIL ONCE DAILY.   furosemide 20 MG tablet Commonly known as: LASIX Take 1 tablet (20  mg total) by mouth daily.   glipiZIDE 10 MG tablet Commonly known as: GLUCOTROL Take 1 tablet (10 mg total) by mouth 2 (two) times daily before a meal.   glucose blood test strip Commonly known as: ONE TOUCH ULTRA TEST CHECK BLOOD SUGAR 2 TIMES A DAY   icosapent Ethyl 1 g capsule Commonly known as: VASCEPA TAKE (2) CAPSULES TWICE DAILY.   insulin lispro 100 UNIT/ML KwikPen Commonly known as: HumaLOG KwikPen Max Daily 25 units   Insulin Pen Needle 32G X 4 MM Misc 1 Device by Does not apply route 3 (three) times daily.   lidocaine-prilocaine cream Commonly known as: EMLA Apply 1 application topically as needed.   magic mouthwash w/lidocaine Soln Take 5 mLs by mouth 3 (three) times daily as needed for mouth pain.  benadryl  525 mg, hydrocortisone 60 mg and nystatin 0.6 mg.   meclizine 12.5 MG tablet Commonly known as: ANTIVERT Take 1 tablet (12.5 mg total) by mouth 3 (three) times daily as needed for dizziness.   metoprolol tartrate 25 MG tablet Commonly known as: LOPRESSOR TAKE 1 TABLET 2 TIMES A DAY   mupirocin cream 2 % Commonly known as: Bactroban Apply 1 application topically 3 (three) times daily.   neomycin-polymyxin-hydrocortisone OTIC solution Commonly known as: CORTISPORIN Place 4 drops into both ears 4 (four) times daily.   olmesartan 20 MG tablet Commonly known as: BENICAR Take 1 tablet (20 mg total) by mouth daily.   Praluent 150 MG/ML Soaj Generic drug: Alirocumab Inject 1 Dose into the skin every 14 (fourteen) days.   pregabalin 50 MG capsule Commonly known as: LYRICA TAKE ONE CAPSULE BY MOUTH TWICE DAILY   Rybelsus 7 MG Tabs Generic drug: Semaglutide Take 1 tablet by mouth daily before breakfast.   Vitamin D3 250 MCG (10000 UT) Tabs Take by mouth 3 (three) times a week.        Allergies:  Allergies  Allergen Reactions   Zofran [Ondansetron Hcl] Other (See Comments)    Severe headache and abdominal pain   Cymbalta [Duloxetine Hcl] Other (See Comments)    Profuse sweating   Farxiga [Dapagliflozin] Other (See Comments)    Dizziness   Januvia [Sitagliptin] Swelling   Meloxicam Nausea And Vomiting   Shellfish Allergy Other (See Comments)    Gout   Trulicity [Dulaglutide] Nausea Only   Crestor [Rosuvastatin] Other (See Comments)    Muscle aches on 5 mg daily and 10 mg twice weekly   Diclofenac Diarrhea   Lipitor [Atorvastatin] Other (See Comments)    Muscle aches   Livalo [Pitavastatin] Other (See Comments)        Sulfa Antibiotics Rash   Tape Other (See Comments)    Causes skin irritation   Zetia [Ezetimibe] Other (See Comments)    Muscle aches   Zocor [Simvastatin] Other (See Comments)    Muscle aches     Past Medical History, Surgical  history, Social history, and Family History were reviewed and updated.  Review of Systems: Review of Systems  Constitutional: Negative.   HENT:  Positive for sore throat.   Eyes: Negative.   Respiratory: Negative.    Cardiovascular: Negative.   Gastrointestinal: Negative.   Genitourinary: Negative.   Musculoskeletal: Negative.   Skin: Negative.   Neurological: Negative.   Endo/Heme/Allergies: Negative.   Psychiatric/Behavioral: Negative.       Physical Exam:  height is '5\' 3"'$  (1.6 m) and weight is 174 lb (78.9 kg). Her oral temperature is 98.4 F (36.9 C). Her blood  pressure is 131/64 and her pulse is 87. Her respiration is 17 and oxygen saturation is 98%.   Wt Readings from Last 3 Encounters:  04/02/22 174 lb (78.9 kg)  02/18/22 176 lb 9.6 oz (80.1 kg)  01/27/22 174 lb (78.9 kg)    Physical Exam Vitals reviewed.  HENT:     Head: Normocephalic and atraumatic.  Eyes:     Pupils: Pupils are equal, round, and reactive to light.  Cardiovascular:     Rate and Rhythm: Normal rate and regular rhythm.     Heart sounds: Normal heart sounds.  Pulmonary:     Effort: Pulmonary effort is normal.     Breath sounds: Normal breath sounds.  Abdominal:     General: Bowel sounds are normal.     Palpations: Abdomen is soft.  Musculoskeletal:        General: No tenderness or deformity. Normal range of motion.     Cervical back: Normal range of motion.  Lymphadenopathy:     Cervical: No cervical adenopathy.  Skin:    General: Skin is warm and dry.     Findings: No erythema or rash.  Neurological:     Mental Status: She is alert and oriented to person, place, and time.  Psychiatric:        Behavior: Behavior normal.        Thought Content: Thought content normal.        Judgment: Judgment normal.      Lab Results  Component Value Date   WBC 6.1 04/02/2022   HGB 12.8 04/02/2022   HCT 37.9 04/02/2022   MCV 94.5 04/02/2022   PLT 133 (L) 04/02/2022   Lab Results  Component  Value Date   FERRITIN 79 12/31/2021   IRON 66 12/31/2021   TIBC 302 12/31/2021   UIBC 236 12/31/2021   IRONPCTSAT 22 12/31/2021   Lab Results  Component Value Date   RETICCTPCT 1.9 12/31/2021   RBC 4.01 04/02/2022   No results found for: "KPAFRELGTCHN", "LAMBDASER", "KAPLAMBRATIO" No results found for: "IGGSERUM", "IGA", "IGMSERUM" No results found for: "TOTALPROTELP", "ALBUMINELP", "A1GS", "A2GS", "BETS", "BETA2SER", "GAMS", "MSPIKE", "SPEI"   Chemistry      Component Value Date/Time   NA 140 04/02/2022 1210   NA 145 (H) 08/14/2021 1454   NA 147 (H) 01/22/2017 1451   NA 140 12/31/2015 1052   K 3.7 04/02/2022 1210   K 4.9 (H) 01/22/2017 1451   K 4.2 12/31/2015 1052   CL 103 04/02/2022 1210   CL 106 01/22/2017 1451   CO2 27 04/02/2022 1210   CO2 29 01/22/2017 1451   CO2 23 12/31/2015 1052   BUN 29 (H) 04/02/2022 1210   BUN 10 08/14/2021 1454   BUN 23 (H) 01/22/2017 1451   BUN 21.4 12/31/2015 1052   CREATININE 1.34 (H) 04/02/2022 1210   CREATININE 1.6 (H) 01/22/2017 1451   CREATININE 1.4 (H) 12/31/2015 1052      Component Value Date/Time   CALCIUM 9.9 04/02/2022 1210   CALCIUM 10.3 01/22/2017 1451   CALCIUM 9.7 12/31/2015 1052   ALKPHOS 82 04/02/2022 1210   ALKPHOS 65 01/22/2017 1451   ALKPHOS 87 12/31/2015 1052   AST 18 04/02/2022 1210   AST 31 12/31/2015 1052   ALT 15 04/02/2022 1210   ALT 25 01/22/2017 1451   ALT 36 12/31/2015 1052   BILITOT 0.5 04/02/2022 1210   BILITOT 0.42 12/31/2015 1052       Impression and Plan: Carmen [name] is a very  pleasant 82 yo caucasian female with stage IIA infiltrating ductal carcinoma of the left breast, triple negative.   She completed her adjuvant chemotherapy with A/C in April 2023.  She recently had a mammogram on 12/24/2021.  The mammogram did not show any evidence of breast cancer, bilaterally.  Again, I suspect she is going to need an MRI of the cervical spine.  She wants to hold off on this for right now.   We  will see what her iron studies look like.  Hopefully, we will not have to do any phlebotomy.  I will plan to get her back to see [name] in another 4 months.   Volanda Napoleon, MD 1/11/20241:00 PM

## 2022-04-02 NOTE — Progress Notes (Signed)
Patient is on observation. She has no current navigation needs. Will discontinue active navigation, however be available to the patient as needed.   Oncology Nurse Navigator Documentation     04/02/2022   12:30 PM  Oncology Nurse Navigator Flowsheets  Navigation Complete Date: 04/02/2022  Post Navigation: Continue to Follow Patient? No  Reason Not Navigating Patient: No Treatment, Observation Only  Navigator Location [facility]  Navigator Encounter Type Appt/Treatment Plan Review  Patient Visit Type MedOnc  Treatment Phase Post-Tx Follow-up  Barriers/Navigation Needs No Barriers At This Time  Interventions None Required  Acuity Level 1-No Barriers  Support Groups/Services Friends and Family  Time Spent with Patient 15

## 2022-04-02 NOTE — Patient Instructions (Signed)

## 2022-04-06 ENCOUNTER — Telehealth: Payer: Self-pay | Admitting: *Deleted

## 2022-04-06 NOTE — Telephone Encounter (Signed)
Per 04/02/22 los - called and gave upcoming appointments - confirmed

## 2022-04-15 ENCOUNTER — Other Ambulatory Visit: Payer: Self-pay | Admitting: Family Medicine

## 2022-04-15 ENCOUNTER — Ambulatory Visit (INDEPENDENT_AMBULATORY_CARE_PROVIDER_SITE_OTHER): Payer: Medicare Other

## 2022-04-15 VITALS — Ht 63.0 in | Wt 174.0 lb

## 2022-04-15 DIAGNOSIS — Z0000 Encounter for general adult medical examination without abnormal findings: Secondary | ICD-10-CM

## 2022-04-15 DIAGNOSIS — Z780 Asymptomatic menopausal state: Secondary | ICD-10-CM

## 2022-04-15 NOTE — Progress Notes (Signed)
Subjective:   Carmen [name] is a 82 y.o. female who presents for Medicare Annual (Subsequent) preventive examination. I connected with  [name] on 04/15/22 by a audio enabled telemedicine application and verified that I am speaking with the correct person using two identifiers.  Patient Location: Home  Provider Location: Home Office  I discussed the limitations of evaluation and management by telemedicine. The patient expressed understanding and agreed to proceed.  Review of Systems     Cardiac Risk Factors include: advanced age (>57mn, >>25women);diabetes mellitus;dyslipidemia     Objective:    Today's Vitals   04/15/22 1118  Weight: 174 lb (78.9 kg)  Height: '5\' 3"'$  (1.6 m)   Body mass index is 30.82 kg/m.     04/15/2022   11:22 AM 12/31/2021    2:17 PM 10/09/2021   12:00 PM 10/06/2021   10:57 AM 08/07/2021   12:14 PM 07/23/2021   12:33 PM 07/02/2021   12:03 PM  Advanced Directives  Does Patient Have a Medical Advance Directive? Yes No No No No No No  Type of [place] will        Copy of HRippeyin Chart? No - copy requested        Would patient like information on creating a medical advance directive?  No - Patient declined No - Patient declined  No - Patient declined No - Patient declined No - Patient declined    Current Medications (verified) Outpatient Encounter Medications as of 04/15/2022  Medication Sig   acyclovir (ZOVIRAX) 400 MG tablet Take 1 tablet (400 mg total) by mouth 3 (three) times daily.   allopurinol (ZYLOPRIM) 100 MG tablet TAKE (1) TABLET TWICE A DAY.   aspirin 81 MG tablet Take 81 mg by mouth daily.   Cholecalciferol (VITAMIN D3) 250 MCG (10000 UT) TABS Take by mouth 3 (three) times a week.   colchicine 0.6 MG tablet TAKE (1) TABLET DAILY AS NEEDED.   diclofenac Sodium (VOLTAREN) 1 % GEL APPLY 4 GRAMS TO AFFECTED AREA(S) 4 TIMES A DAY AS NEEDED   fluticasone (FLONASE)  50 MCG/ACT nasal spray SPRAY 1 SPRAY IN EACH NOSTRIL ONCE DAILY.   furosemide (LASIX) 20 MG tablet Take 1 tablet (20 mg total) by mouth daily.   glipiZIDE (GLUCOTROL) 10 MG tablet Take 1 tablet (10 mg total) by mouth 2 (two) times daily before a meal.   glucose blood (ONE TOUCH ULTRA TEST) test strip CHECK BLOOD SUGAR 2 TIMES A DAY   icosapent Ethyl (VASCEPA) 1 g capsule TAKE (2) CAPSULES TWICE DAILY.   insulin lispro (HUMALOG KWIKPEN) 100 UNIT/ML KwikPen Max Daily 25 units   Insulin Pen Needle 32G X 4 MM MISC 1 Device by Does not apply route 3 (three) times daily.   Lancets Misc. (ACCU-CHEK FASTCLIX LANCET) KIT Use to check Blood Sugars   lidocaine-prilocaine (EMLA) cream Apply 1 application topically as needed.   magic mouthwash w/lidocaine SOLN Take 5 mLs by mouth 3 (three) times daily as needed for mouth pain. benadryl  525 mg, hydrocortisone 60 mg and nystatin 0.6 mg.   meclizine (ANTIVERT) 12.5 MG tablet Take 1 tablet (12.5 mg total) by mouth 3 (three) times daily as needed for dizziness.   metoprolol tartrate (LOPRESSOR) 25 MG tablet TAKE 1 TABLET 2 TIMES A DAY   mupirocin cream (BACTROBAN) 2 % Apply 1 application topically 3 (three) times daily.   neomycin-polymyxin-hydrocortisone (CORTISPORIN) OTIC solution Place 4 drops into  both ears 4 (four) times daily.   olmesartan (BENICAR) 20 MG tablet Take 1 tablet (20 mg total) by mouth daily.   PRALUENT 150 MG/ML SOAJ Inject 1 Dose into the skin every 14 (fourteen) days.   pregabalin (LYRICA) 50 MG capsule TAKE ONE CAPSULE BY MOUTH TWICE DAILY   Semaglutide (RYBELSUS) 7 MG TABS Take 1 tablet by mouth daily before breakfast.   [DISCONTINUED] prochlorperazine (COMPAZINE) 10 MG tablet Take 1 tablet (10 mg total) by mouth every 6 (six) hours as needed (Nausea or vomiting).   No facility-administered encounter medications on file as of 04/15/2022.    Allergies (verified) Zofran [ondansetron hcl], Cymbalta [duloxetine hcl], Farxiga  [dapagliflozin], Januvia [sitagliptin], Meloxicam, Shellfish allergy, Trulicity [dulaglutide], Crestor [rosuvastatin], Diclofenac, Lipitor [atorvastatin], Livalo [pitavastatin], Sulfa antibiotics, Tape, Zetia [ezetimibe], and Zocor [simvastatin]   History: Past Medical History:  Diagnosis Date   Arthritis    Asymptomatic PVCs    Bilateral shoulder pain    CAD (coronary artery disease)    Cancer ([place]) 11/2015   [id number]  right upper arm   Chronic renal insufficiency, stage 3 (moderate) ([place])    Diabetes mellitus without complication ([place])    Diverticulosis    Endometrial polyp    External hemorrhoids    Goals of care, counseling/discussion 02/07/2021   Gout    Hemochromatosis, hereditary ([place]) 11/24/2012   History of anemia    History of duodenal ulcer 1990   History of radiation therapy    Left breast- 08/06/21-09/04/21- Dr. Gery Pray   Hyperlipidemia    Hypertension    Internal hemorrhoids    Jaundice    age 71 or 64   Myocardial infarction ([place]) 2007   Neuromuscular disorder ([place])    peripheral neuropathy   PMB (postmenopausal bleeding)    PONV (postoperative nausea and vomiting)    Prolapse of female pelvic organs    uses pessary   Seizures ([place])    had one at Dr. Antonieta Pert office after getting blood drawn   Stage I breast cancer, left ([place]) 12/16/2020   Tick bite 08/12/2017   had 3 bites   Type 2 diabetes mellitus with hyperglycemia, without long-term current use of insulin ([place]) 01/27/2019   Vitamin D deficiency    Past Surgical History:  Procedure Laterality Date   BREAST LUMPECTOMY WITH RADIOACTIVE SEED AND SENTINEL LYMPH NODE BIOPSY Left 01/14/2021   Procedure: LEFT BREAST LUMPECTOMY WITH RADIOACTIVE SEED AND SENTINEL LYMPH NODE BIOPSY;  Surgeon: Jovita Kussmaul, MD;  Location: [facility];  Service: General;  Laterality: Left;   BREAST SURGERY     left breast lump--benign   CARDIAC CATHETERIZATION  06/03/2005   COLONOSCOPY  11/28/2001    CORONARY ARTERY BYPASS GRAFT  2007   x2 Dr. Roxan Hockey, [facility] to [facility], SVG to [facility] N/A 09/28/2017   Procedure: [place];  Surgeon: Molli Posey, MD;  Location[facility];  Service: Gynecology;  Laterality: N/A;   LIPOMA EXCISION     back   LIPOMA EXCISION Left 01/14/2021   Procedure: EXCISION LEFT CHEST WALL LIPOMA;  Surgeon: Jovita Kussmaul, MD;  Location: [facility];  Service: General;  Laterality: Left;   PORTACATH PLACEMENT Right 01/14/2021   Procedure: INSERTION PORT-A-CATH;  Surgeon: Jovita Kussmaul, MD;  Location: [facility];  Service: General;  Laterality: Right;   UPPER GI ENDOSCOPY  01/20/1989   Family History  Problem Relation Age of Onset  Stroke Mother    Hypertension Mother    Neuropathy Mother    Stroke Father    Hypertension Father    Diabetes Father    Cancer Sister 50       sarcoma   Arthritis Sister    Arthritis Sister    Lupus Sister    Hemachromatosis Sister    Arthritis Sister    Hemachromatosis Sister    Hemachromatosis Sister    Diverticulitis Sister    Diabetes Brother    Diabetes Brother    Diabetes Brother    Hemachromatosis Brother    Lung cancer Brother    Liver cancer Maternal Grandmother 61   Melanoma Daughter 49   Social History   Socioeconomic History   Marital status: Widowed    Spouse name: Not on file   Number of children: 3   Years of education: 12   Highest education level: High school graduate  Occupational History   Occupation: retired  Tobacco Use   Smoking status: Never   Smokeless tobacco: Never   Tobacco comments:    never used tobacco  Vaping Use   Vaping Use: Never used  Substance and Sexual Activity   Alcohol use: Yes    Alcohol/week: 0.0 standard drinks of alcohol    Comment: rare wine   Drug use: No   Sexual activity: Not Currently    Birth control/protection:  Post-menopausal  Other Topics Concern   Not on file  Social History Narrative   Her 2 daughters live nearby and visit often   Social Determinants of Health   Financial Resource Strain: Low Risk  (04/15/2022)   Overall Financial Resource Strain (CARDIA)    Difficulty of Paying Living Expenses: Not hard at all  Food Insecurity: No Food Insecurity (04/15/2022)   Hunger Vital Sign    Worried About Running Out of Food in the Last Year: Never true    [place] in the Last Year: Never true  Transportation Needs: No Transportation Needs (04/15/2022)   PRAPARE - Hydrologist (Medical): No    Lack of Transportation (Non-Medical): No  Physical Activity: Insufficiently Active (04/15/2022)   Exercise Vital Sign    Days of Exercise per Week: 3 days    Minutes of Exercise per Session: 30 min  Stress: No Stress Concern Present (04/15/2022)   [place]    Feeling of Stress : Not at all  Social Connections: Moderately Isolated (04/15/2022)   Social Connection and Isolation Panel [NHANES]    Frequency of Communication with Friends and Family: More than three times a week    Frequency of Social Gatherings with Friends and Family: More than three times a week    Attends Religious Services: More than 4 times per year    Active Member of [employer] or Organizations: No    Attends Archivist Meetings: Never    Marital Status: Widowed    Tobacco Counseling Counseling given: Not Answered Tobacco comments: never used tobacco   Clinical Intake:  Pre-visit preparation completed: Yes  Pain : No/denies pain     Nutritional Risks: None Diabetes: Yes CBG done?: No Did pt. bring in CBG monitor from home?: No  How often do you need to have someone help you when you read instructions, pamphlets, or other written materials from your doctor or pharmacy?: 1 - Never  Diabetic?yes Nutrition Risk  Assessment:  Has the patient had any N/V/D within  the last 2 months?  No  Does the patient have any non-healing wounds?  No  Has the patient had any unintentional weight loss or weight gain?  No   Diabetes:  Is the patient diabetic?  Yes  If diabetic, was a CBG obtained today?  No  Did the patient bring in their glucometer from home?  No  How often do you monitor your CBG's? Daily    Financial Strains and Diabetes Management:  Are you having any financial strains with the device, your supplies or your medication? No .  Does the patient want to be seen by Chronic Care Management for management of their diabetes?  No  Would the patient like to be referred to a Nutritionist or for Diabetic Management?  No   Diabetic Exams:  Diabetic Eye Exam: Completed 01/2022 Diabetic Foot Exam: Overdue, Pt has been advised about the importance in completing this exam. Pt is scheduled for diabetic foot exam on next office visit .   Interpreter Needed?: No  Information entered by :: Jadene Pierini, LPN   Activities of Daily Living    04/15/2022   11:23 AM  In your present state of health, do you have any difficulty performing the following activities:  Hearing? 0  Vision? 0  Difficulty concentrating or making decisions? 0  Walking or climbing stairs? 0  Dressing or bathing? 0  Doing errands, shopping? 0  Preparing Food and eating ? N  Using the Toilet? N  In the past six months, have you accidently leaked urine? N  Do you have problems with loss of bowel control? N  Managing your Medications? N  Managing your Finances? N  Housekeeping or managing your Housekeeping? N    Patient Care Team: Claretta Fraise, MD as PCP - General (Family Medicine) Debara Pickett Nadean Corwin, MD as PCP - Cardiology (Cardiology) Marin Olp Rudell Cobb, MD as Consulting Physician (Oncology) Molli Posey, MD as Consulting Physician (Obstetrics and Gynecology) Debara Pickett Nadean Corwin, MD as Consulting Physician  (Cardiology) Shea Evans Norva Riffle, LCSW as [place] (Licensed Clinical Social Worker)  Indicate any recent [place] you may have received from other than [facility] providers in the past year (date may be approximate).     Assessment:   This is a routine wellness examination for Durene.  Hearing/Vision screen Vision Screening - Comments:: Wears rx glasses - up to date with routine eye exams with  Dr.Lee  Dietary issues and exercise activities discussed: Current Exercise Habits: Home exercise routine, Type of exercise: strength training/weights, Time (Minutes): 30, Frequency (Times/Week): 3, Weekly Exercise (Minutes/Week): 90, Intensity: Mild, Exercise limited by: None identified   Goals Addressed             This Visit's Progress    AWV   On track    04/12/2020 AWV Goal: Diabetes Management  Patient will maintain an A1C level below 8.0 Patient will not develop any diabetic foot complications Patient will not experience any hypoglycemic episodes over the next 3 months Patient will notify our office of any CBG readings outside of the provider recommended range by calling [phone] Patient will adhere to provider recommendations for diabetes management  Patient Self Management Activities take all medications as prescribed and report any negative side effects monitor and record blood sugar readings as directed adhere to a low carbohydrate diet that incorporates lean proteins, vegetables, whole grains, low glycemic fruits check feet daily noting any sores, cracks, injuries, or callous formations see PCP or podiatrist if she notices  any changes in her legs, feet, or toenails Patient will visit PCP and have an A1C level checked every 3 to 6 months as directed  have a yearly eye exam to monitor for vascular changes associated with diabetes and will request that the report be sent to her pcp.  consult with her PCP regarding any changes in her health  or new or worsening symptoms  04/12/2020 AWV Goal: Fall Prevention  Over the next year, patient will decrease their risk for falls by: Using assistive devices, such as a cane or walker, as needed Identifying fall risks within their home and correcting them by: Removing throw rugs Adding handrails to stairs or ramps Removing clutter and keeping a clear pathway throughout the home Increasing light, especially at night Adding shower handles/bars Raising toilet seat Identifying potential personal risk factors for falls: Medication side effects Incontinence/urgency Vestibular dysfunction Hearing loss Musculoskeletal disorders Neurological disorders Orthostatic hypotension       DIET - INCREASE WATER INTAKE   On track    Try to drink 6-8 glasses of water daily.       Depression Screen    04/15/2022   11:22 AM 02/18/2022   12:55 PM 08/14/2021    2:12 PM 04/14/2021    1:25 PM 01/20/2021    1:54 PM 12/16/2020    5:52 PM 09/03/2020    2:51 PM  PHQ 2/9 Scores  PHQ - 2 Score 0 0 0 0 0 1 0  PHQ- 9 Score      4 2    Fall Risk    04/15/2022   11:20 AM 02/18/2022   12:55 PM 08/14/2021    2:12 PM 04/14/2021    1:28 PM 01/20/2021    1:54 PM  [place] in the past year? 0 0 0 0 0  Number falls in past yr: 0   0   Injury with Fall? 0   0   Risk for fall due to : No Fall Risks   Impaired balance/gait;Medication side effect   Follow up Falls prevention discussed   Education provided;Falls prevention discussed     FALL RISK PREVENTION PERTAINING TO THE HOME:  Any stairs in or around the home? Yes  If so, are there any without handrails? No  Home free of loose throw rugs in walkways, pet beds, electrical cords, etc? Yes  Adequate lighting in your home to reduce risk of falls? Yes   ASSISTIVE DEVICES UTILIZED TO PREVENT FALLS:  Life alert? No  Use of a cane, walker or w/c? Yes  Grab bars in the bathroom? Yes  Shower chair or bench in shower? Yes  Elevated toilet seat or  a handicapped toilet? Yes       11/08/2017    5:06 PM 08/08/2014    3:16 PM  MMSE - Mini Mental State Exam  Orientation to time 5 5  Orientation to Place 5 5  Registration 3 3  Attention/ Calculation 5 5  Recall 3 3  Language- name 2 objects 2 2  Language- repeat 1 1  Language- follow 3 step command 3 3  Language- read & follow direction 1 1  Write a sentence 1 1  Copy design 1 1  Total score 30 30        04/15/2022   11:23 AM 04/12/2020    1:52 PM 11/10/2018    1:41 PM  6CIT Screen  What Year? 0 points 0 points 0 points  What month? 0 points 0  points 0 points  What time? 0 points 0 points 0 points  Count back from 20 0 points 0 points 0 points  Months in reverse 0 points 0 points 0 points  Repeat phrase 0 points 0 points 0 points  Total Score 0 points 0 points 0 points    Immunizations Immunization History  Administered Date(s) Administered   Fluad Quad(high Dose 65+) 01/20/2021, 02/18/2022   Influenza, High Dose Seasonal PF 12/09/2012, 12/26/2013, 03/18/2018   Influenza,inj,Quad PF,6+ Mos 01/01/2015   Influenza,inj,quad, With Preservative 04/23/2017   Influenza-Unspecified 12/21/2013, 01/21/2016, 03/09/2020   PFIZER(Purple Top)SARS-COV-2 Vaccination 04/16/2019, 05/08/2019, 01/20/2020, 10/05/2020   Zoster, Live 01/24/2015    TDAP status: Due, Education has been provided regarding the importance of this vaccine. Advised may receive this vaccine at local pharmacy or Health Dept. Aware to provide a copy of the vaccination record if obtained from local pharmacy or Health Dept. Verbalized acceptance and understanding.  Flu Vaccine status: Up to date  Pneumococcal vaccine status: Due, Education has been provided regarding the importance of this vaccine. Advised may receive this vaccine at local pharmacy or Health Dept. Aware to provide a copy of the vaccination record if obtained from local pharmacy or Health Dept. Verbalized acceptance and understanding.  Covid-19  vaccine status: Completed vaccines  Qualifies for Shingles Vaccine? Yes   Zostavax completed No   Shingrix Completed?: No.    Education has been provided regarding the importance of this vaccine. Patient has been advised to call insurance company to determine out of pocket expense if they have not yet received this vaccine. Advised may also receive vaccine at local pharmacy or Health Dept. Verbalized acceptance and understanding.  Screening Tests Health Maintenance  Topic Date Due   DTaP/Tdap/Td (1 - Tdap) Never done   OPHTHALMOLOGY EXAM  11/26/2018   DEXA SCAN  11/09/2019   COVID-19 Vaccine (5 - 2023-24 season) 11/21/2021   Zoster Vaccines- Shingrix (1 of 2) 05/21/2022 (Originally 06/06/1959)   Pneumonia Vaccine 58+ Years old (1 - PCV) 02/19/2023 (Originally 06/05/2005)   MAMMOGRAM  06/25/2022   FOOT EXAM  07/25/2022   HEMOGLOBIN A1C  07/28/2022   Diabetic kidney evaluation - Urine ACR  02/19/2023   Diabetic kidney evaluation - eGFR measurement  04/03/2023   Medicare Annual Wellness (AWV)  04/16/2023   INFLUENZA VACCINE  Completed   HPV VACCINES  Aged Out    Health Maintenance  Health Maintenance Due  Topic Date Due   DTaP/Tdap/Td (1 - Tdap) Never done   OPHTHALMOLOGY EXAM  11/26/2018   DEXA SCAN  11/09/2019   COVID-19 Vaccine (5 - 2023-24 season) 11/21/2021    Colorectal cancer screening: No longer required.   Mammogram status: No longer required due to age.  Bone Density status: Ordered 04/15/2022. Pt provided with contact info and advised to call to schedule appt.  Lung Cancer Screening: (Low Dose CT Chest recommended if Age 79-80 years, 30 pack-year currently smoking OR have quit w/in 15years.) does not qualify.   Lung Cancer Screening Referral: n/a  Additional Screening:  Hepatitis C Screening: does not qualify;   Vision Screening: Recommended annual ophthalmology exams for early detection of glaucoma and other disorders of the eye. Is the patient up to date with  their annual eye exam?  Yes  Who is the provider or what is the name of the office in which the patient attends annual eye exams? Dr.Lee  If pt is not established with a provider, would they like to be referred to a provider  to establish care? No .   Dental Screening: Recommended annual dental exams for proper oral hygiene  Community Resource Referral / Chronic Care Management: CRR required this visit?  No   CCM required this visit?  No      Plan:     I have personally reviewed and noted the following in the patient's chart:   Medical and social history Use of alcohol, tobacco or illicit drugs  Current medications and supplements including opioid prescriptions. Patient is not currently taking opioid prescriptions. Functional ability and status Nutritional status Physical activity Advanced directives List of other physicians Hospitalizations, surgeries, and ER visits in previous 12 months Vitals Screenings to include cognitive, depression, and falls Referrals and appointments  In addition, I have reviewed and discussed with patient certain preventive protocols, quality metrics, and best practice recommendations. A written personalized care plan for preventive services as well as general preventive health recommendations were provided to patient.     Daphane Shepherd, LPN   11/18/9369   Nurse Notes: Due TDAP/Pneumonia Vaccine

## 2022-04-15 NOTE — Patient Instructions (Signed)
Carmen [name] , Thank you for taking time to come for your Medicare Wellness Visit. I appreciate your ongoing commitment to your health goals. Please review the following plan we discussed and let me know if I can assist you in the future.   These are the goals we discussed:  Goals       AWV      04/12/2020 AWV Goal: Diabetes Management  Patient will maintain an A1C level below 8.0 Patient will not develop any diabetic foot complications Patient will not experience any hypoglycemic episodes over the next 3 months Patient will notify our office of any CBG readings outside of the provider recommended range by calling [phone] Patient will adhere to provider recommendations for diabetes management  Patient Self Management Activities take all medications as prescribed and report any negative side effects monitor and record blood sugar readings as directed adhere to a low carbohydrate diet that incorporates lean proteins, vegetables, whole grains, low glycemic fruits check feet daily noting any sores, cracks, injuries, or callous formations see PCP or podiatrist if she notices any changes in her legs, feet, or toenails Patient will visit PCP and have an A1C level checked every 3 to 6 months as directed  have a yearly eye exam to monitor for vascular changes associated with diabetes and will request that the report be sent to her pcp.  consult with her PCP regarding any changes in her health or new or worsening symptoms  04/12/2020 AWV Goal: Fall Prevention  Over the next year, patient will decrease their risk for falls by: Using assistive devices, such as a cane or walker, as needed Identifying fall risks within their home and correcting them by: Removing throw rugs Adding handrails to stairs or ramps Removing clutter and keeping a clear pathway throughout the home Increasing light, especially at night Adding shower handles/bars Raising toilet seat Identifying potential personal risk  factors for falls: Medication side effects Incontinence/urgency Vestibular dysfunction Hearing loss Musculoskeletal disorders Neurological disorders Orthostatic hypotension        Client will call LCSW in next 30 days to talk about her management of medical condtions and about her completion of ADLs (pt-stated)      CARE PLAN ENTRY   Current Barriers:  Vertigo issues in client with chronic diagnoses of HTN, HLD, Arthritis, S/P CABG X 2, Viatmin D Deficiency, CKD Mobility issues  Clinical Social Work Clinical Goal(s):  LCSW to call client in next 30 days to talk with her about management of her medical condition and her completion of daily ADLs  Interventions: Talked with client about CCM program support Talked with client about RNCM support with CCM program Talked with client about pain issues of client Talked with client about social support network of client (has  support from daughter) Talked with client about physical therapy sessions of client  Talked with client about ambulation needs of client (uses a cane to help her walk) Talked with client about DME of client (has a tub seat, hand held shower, grab bars, low step shower) Talked with client about vision challenges of client  Talked with client about her recent visit with endocrinologist Talked with client about relaxation activities (likes to read and sit on front porch, likes to work with flowers/roses) Talked with client about her upcoming client appointments  Talked with client about her meals provision   Patient Self Care Activities:  Does ADLs independently Attends scheduled medical appointments Drives to appointments and drives to complete errands  Patient Self Care  Deficits:  Balance issues Ambulation issues Vertigo issues  Initial goal documentation       Complete ADLs daily, manage daily needs (pt-stated)      Timeframe:  Short-Term Goal Priority:  Medium This Visit's Progress:  On Track Start  Date:    12/16/20                        Expected End Date:  03/11/21                  Follow Up Date 01/31/21 at 10:00 AM  Find Help in My Community (Patient);  Patient needs information on community resources to help her manage medical conditions. Client to complete ADLs daily and manage her daily needs  Why is this important?   Knowing how and where to find help for yourself or family in your neighborhood and community is an important skill.  You will want to take some steps to learn how.   Patient Strengths: Has support from her daughter Takes medications as prescribed Attends scheduled medical appointments  Patient Defictis  Needs some help occasionally with ADLs completion Social isolation occasionally   Patient Goals:  Client will use coping strategies in next 30 days to help her manage ongoing medical issues faced Client will complete ADLs daily as needed for next 30 days -  Follow Up Plan: LCSW to call client on 01/31/21 at 10:00 AM to assess client needs    -          DIET - INCREASE WATER INTAKE      Try to drink 6-8 glasses of water daily.      HEMOGLOBIN A1C < 7.0      Decrease carbohydrate intake       LDL CALC < 70      Manage My Emotions      Protect My Health        This is a list of the screening recommended for you and due dates:  Health Maintenance  Topic Date Due   DTaP/Tdap/Td vaccine (1 - Tdap) Never done   Eye exam for diabetics  11/26/2018   DEXA scan (bone density measurement)  11/09/2019   COVID-19 Vaccine (5 - 2023-24 season) 11/21/2021   Zoster (Shingles) Vaccine (1 of 2) 05/21/2022*   Pneumonia Vaccine (1 - PCV) 02/19/2023*   Mammogram  06/25/2022   Complete foot exam   07/25/2022   Hemoglobin A1C  07/28/2022   Yearly kidney health urinalysis for diabetes  02/19/2023   Yearly kidney function blood test for diabetes  04/03/2023   Medicare Annual Wellness Visit  04/16/2023   Flu Shot  Completed   HPV Vaccine  Aged Out  *Topic was  postponed. The date shown is not the original due date.    Advanced directives: Advance directive discussed with you today. I have provided a copy for you to complete at home and have notarized. Once this is complete please bring a copy in to our office so we can scan it into your chart.   Conditions/risks identified: Aim for 30 minutes of exercise or brisk walking, 6-8 glasses of water, and 5 servings of fruits and vegetables each day.   Next appointment: Follow up in one year for your annual wellness visit    Preventive Care 65 Years and Older, Female Preventive care refers to lifestyle choices and visits with your health care provider that can promote health and wellness. What does preventive care include? A  yearly physical exam. This is also called an annual well check. Dental exams once or twice a year. Routine eye exams. Ask your health care provider how often you should have your eyes checked. Personal lifestyle choices, including: Daily care of your teeth and gums. Regular physical activity. Eating a healthy diet. Avoiding tobacco and drug use. Limiting alcohol use. Practicing safe sex. Taking low-dose aspirin every day. Taking vitamin and mineral supplements as recommended by your health care provider. What happens during an annual well check? The services and screenings done by your health care provider during your annual well check will depend on your age, overall health, lifestyle risk factors, and family history of disease. Counseling  Your health care provider may ask you questions about your: Alcohol use. Tobacco use. Drug use. Emotional well-being. Home and relationship well-being. Sexual activity. Eating habits. History of falls. Memory and ability to understand (cognition). Work and work Statistician. Reproductive health. Screening  You may have the following tests or measurements: Height, weight, and BMI. Blood pressure. Lipid and cholesterol levels.  These may be checked every 5 years, or more frequently if you are over 50 years old. Skin check. Lung cancer screening. You may have this screening every year starting at age 56 if you have a 30-pack-year history of smoking and currently smoke or have quit within the past 15 years. Fecal occult blood test (FOBT) of the stool. You may have this test every year starting at age 46. Flexible sigmoidoscopy or colonoscopy. You may have a sigmoidoscopy every 5 years or a colonoscopy every 10 years starting at age 45. Hepatitis C blood test. Hepatitis B blood test. Sexually transmitted disease (STD) testing. Diabetes screening. This is done by checking your blood sugar (glucose) after you have not eaten for a while (fasting). You may have this done every 1-3 years. Bone density scan. This is done to screen for osteoporosis. You may have this done starting at age 48. Mammogram. This may be done every 1-2 years. Talk to your health care provider about how often you should have regular mammograms. Talk with your health care provider about your test results, treatment options, and if necessary, the need for more tests. Vaccines  Your health care provider may recommend certain vaccines, such as: Influenza vaccine. This is recommended every year. Tetanus, diphtheria, and acellular pertussis (Tdap, Td) vaccine. You may need a Td booster every 10 years. Zoster vaccine. You may need this after age 31. Pneumococcal 13-valent conjugate (PCV13) vaccine. One dose is recommended after age 11. Pneumococcal polysaccharide (PPSV23) vaccine. One dose is recommended after age 6. Talk to your health care provider about which screenings and vaccines you need and how often you need them. This information is not intended to replace advice given to you by your health care provider. Make sure you discuss any questions you have with your health care provider. Document Released: 04/05/2015 Document Revised: 11/27/2015 Document  Reviewed: 01/08/2015 Elsevier Interactive Patient Education  2017 [place] Prevention in the Home Falls can cause injuries. They can happen to people of all ages. There are many things you can do to make your home safe and to help prevent falls. What can I do on the outside of my home? Regularly fix the edges of walkways and driveways and fix any cracks. Remove anything that might make you trip as you walk through a door, such as a raised step or threshold. Trim any bushes or trees on the path to your home. Use bright  outdoor lighting. Clear any walking paths of anything that might make someone trip, such as rocks or tools. Regularly check to see if handrails are loose or broken. Make sure that both sides of any steps have handrails. Any raised decks and porches should have guardrails on the edges. Have any leaves, snow, or ice cleared regularly. Use sand or salt on walking paths during winter. Clean up any spills in your garage right away. This includes oil or grease spills. What can I do in the bathroom? Use night lights. Install grab bars by the toilet and in the tub and shower. Do not use towel bars as grab bars. Use non-skid mats or decals in the tub or shower. If you need to sit down in the shower, use a plastic, non-slip stool. Keep the floor dry. Clean up any water that spills on the floor as soon as it happens. Remove soap buildup in the tub or shower regularly. Attach bath mats securely with double-sided non-slip rug tape. Do not have throw rugs and other things on the floor that can make you trip. What can I do in the bedroom? Use night lights. Make sure that you have a light by your bed that is easy to reach. Do not use any sheets or blankets that are too big for your bed. They should not hang down onto the floor. Have a firm chair that has side arms. You can use this for support while you get dressed. Do not have throw rugs and other things on the floor that can  make you trip. What can I do in the kitchen? Clean up any spills right away. Avoid walking on wet floors. Keep items that you use a lot in easy-to-reach places. If you need to reach something above you, use a strong step stool that has a grab bar. Keep electrical cords out of the way. Do not use floor polish or wax that makes floors slippery. If you must use wax, use non-skid floor wax. Do not have throw rugs and other things on the floor that can make you trip. What can I do with my stairs? Do not leave any items on the stairs. Make sure that there are handrails on both sides of the stairs and use them. Fix handrails that are broken or loose. Make sure that handrails are as long as the stairways. Check any carpeting to make sure that it is firmly attached to the stairs. Fix any carpet that is loose or worn. Avoid having throw rugs at the top or bottom of the stairs. If you do have throw rugs, attach them to the floor with carpet tape. Make sure that you have a light switch at the top of the stairs and the bottom of the stairs. If you do not have them, ask someone to add them for you. What else can I do to help prevent falls? Wear shoes that: Do not have high heels. Have rubber bottoms. Are comfortable and fit you well. Are closed at the toe. Do not wear sandals. If you use a stepladder: Make sure that it is fully opened. Do not climb a closed stepladder. Make sure that both sides of the stepladder are locked into place. Ask someone to hold it for you, if possible. Clearly mark and make sure that you can see: Any grab bars or handrails. First and last steps. Where the edge of each step is. Use tools that help you move around (mobility aids) if they are needed. These include:  Canes. Walkers. Scooters. Crutches. Turn on the lights when you go into a dark area. Replace any light bulbs as soon as they burn out. Set up your furniture so you have a clear path. Avoid moving your furniture  around. If any of your floors are uneven, fix them. If there are any pets around you, be aware of where they are. Review your medicines with your doctor. Some medicines can make you feel dizzy. This can increase your chance of falling. Ask your doctor what other things that you can do to help prevent falls. This information is not intended to replace advice given to you by your health care provider. Make sure you discuss any questions you have with your health care provider. Document Released: 01/03/2009 Document Revised: 08/15/2015 Document Reviewed: 04/13/2014 Elsevier Interactive Patient Education  2017 [employer].

## 2022-04-24 ENCOUNTER — Inpatient Hospital Stay: Payer: Medicare Other | Attending: Hematology & Oncology

## 2022-04-24 MED ORDER — SODIUM CHLORIDE 0.9% FLUSH
10.0000 mL | Freq: Once | INTRAVENOUS | Status: AC
  Administered 2022-04-24: 10 mL via INTRAVENOUS

## 2022-04-24 MED ORDER — HEPARIN SOD (PORK) LOCK FLUSH 100 UNIT/ML IV SOLN
500.0000 [IU] | Freq: Once | INTRAVENOUS | Status: AC
  Administered 2022-04-24: 500 [IU] via INTRAVENOUS

## 2022-04-24 NOTE — Patient Instructions (Signed)

## 2022-04-24 NOTE — Progress Notes (Signed)
Carmen [name] presents today for phlebotomy per MD orders. Phlebotomy procedure started at 1423 and ended at 1442 500 grams removed via 19G PAC.  Patient observed for 30 minutes after procedure without any incident. Patient tolerated procedure well. Patient stayed for drinks and snacks post phlebotomy.  Patient understands to call if he has any questions or concerns post discharge.

## 2022-04-29 ENCOUNTER — Other Ambulatory Visit: Payer: Self-pay

## 2022-05-15 ENCOUNTER — Telehealth: Payer: Self-pay | Admitting: Family Medicine

## 2022-05-15 NOTE — Telephone Encounter (Signed)
Pt says she takes pregabalin for the neuropathy in her feet but says since finishing chemo, she is getting neuropathy in her hands too and wants to know if Dr Livia Snellen can increase her dosage of Pregabalin. Says right now she's not able to sleep at night because the neuropathy is so bad.   Please advise.

## 2022-05-15 NOTE — Telephone Encounter (Signed)
It is a controlled substance. Please set up an office visit

## 2022-05-19 ENCOUNTER — Encounter: Payer: Self-pay | Admitting: Family Medicine

## 2022-05-19 ENCOUNTER — Ambulatory Visit (INDEPENDENT_AMBULATORY_CARE_PROVIDER_SITE_OTHER): Payer: Medicare Other | Admitting: Family Medicine

## 2022-05-19 VITALS — BP 152/81 | HR 72 | Temp 97.3°F | Ht 63.0 in | Wt 179.6 lb

## 2022-05-19 DIAGNOSIS — E1122 Type 2 diabetes mellitus with diabetic chronic kidney disease: Secondary | ICD-10-CM | POA: Diagnosis not present

## 2022-05-19 DIAGNOSIS — R5383 Other fatigue: Secondary | ICD-10-CM | POA: Diagnosis not present

## 2022-05-19 DIAGNOSIS — E559 Vitamin D deficiency, unspecified: Secondary | ICD-10-CM | POA: Diagnosis not present

## 2022-05-19 DIAGNOSIS — E1140 Type 2 diabetes mellitus with diabetic neuropathy, unspecified: Secondary | ICD-10-CM | POA: Diagnosis not present

## 2022-05-19 DIAGNOSIS — C50912 Malignant neoplasm of unspecified site of left female breast: Secondary | ICD-10-CM | POA: Diagnosis not present

## 2022-05-19 DIAGNOSIS — N1832 Chronic kidney disease, stage 3b: Secondary | ICD-10-CM

## 2022-05-19 DIAGNOSIS — E1159 Type 2 diabetes mellitus with other circulatory complications: Secondary | ICD-10-CM

## 2022-05-19 DIAGNOSIS — I152 Hypertension secondary to endocrine disorders: Secondary | ICD-10-CM

## 2022-05-19 DIAGNOSIS — E782 Mixed hyperlipidemia: Secondary | ICD-10-CM

## 2022-05-19 LAB — BAYER DCA HB A1C WAIVED: HB A1C (BAYER DCA - WAIVED): 6.7 % — ABNORMAL HIGH (ref 4.8–5.6)

## 2022-05-19 MED ORDER — RYBELSUS 14 MG PO TABS: 14.0000 mg | ORAL_TABLET | Freq: Every day | ORAL | 1 refills | Status: DC

## 2022-05-19 MED ORDER — PREGABALIN 50 MG PO CAPS: 50.0000 mg | ORAL_CAPSULE | Freq: Three times a day (TID) | ORAL | 1 refills | Status: DC

## 2022-05-19 NOTE — Progress Notes (Signed)
Subjective:  Patient ID: Carmen [name], female    DOB: Jul 27, 1940  Age: 82 y.o. MRN: [id number]  CC: Medical Management of Chronic Issues   HPI ZYIAH GELPI presents for presents forFollow-up of diabetes. Patient checks blood sugar at home.   160-200 fasting and 179-200 postprandial Patient denies symptoms such as polyuria, polydipsia, excessive hunger, nausea No significant hypoglycemic spells noted. Medications reviewed. Pt reports taking them regularly without complication/adverse reaction being reported today.   Lab Results  Component Value Date   HGBA1C 6.7 (H) 05/19/2022   HGBA1C 5.8 (A) 01/27/2022   HGBA1C 6.7 (A) 07/24/2021      Worsening neuropathy in hands - stocking glove distribution. Worse in the afternoon and the evening. Wants to increase the Lyrica. Finished Chemo last April or May. Up at 4 AM rubbing them, soaking them, etc. Legs treated with Lyrica are stable.       05/19/2022    3:26 PM 04/15/2022   11:22 AM 02/18/2022   12:55 PM  Depression screen PHQ 2/9  Decreased Interest 0 0 0  Down, Depressed, Hopeless 0 0 0  PHQ - 2 Score 0 0 0    History Carmen [name] has a past medical history of Arthritis, Asymptomatic PVCs, Bilateral shoulder pain, CAD (coronary artery disease), Cancer ([place]) (11/2015), Chronic renal insufficiency, stage 3 (moderate) ([place]), Diabetes mellitus without complication ([place]), Diverticulosis, Endometrial polyp, External hemorrhoids, Goals of care, counseling/discussion (02/07/2021), Gout, Hemochromatosis, hereditary ([place]) (11/24/2012), History of anemia, History of duodenal ulcer (1990), History of radiation therapy, Hyperlipidemia, Hypertension, Internal hemorrhoids, Jaundice, Myocardial infarction ([place]) (2007), Neuromuscular disorder ([place]), PMB (postmenopausal bleeding), PONV (postoperative nausea and vomiting), Prolapse of female pelvic organs, Seizures ([place]), Stage I breast cancer, left ([place]) (12/16/2020), Tick bite (08/12/2017),  Type 2 diabetes mellitus with hyperglycemia, without long-term current use of insulin ([place]) (01/27/2019), and Vitamin D deficiency.   She has a past surgical history that includes Lipoma excision; Breast surgery; Cardiac catheterization (06/03/2005); Colonoscopy (11/28/2001); Upper gi endoscopy (01/20/1989); Coronary artery bypass graft (2007); Dilatation & curettage/hysteroscopy with myosure (N/A, 09/28/2017); Breast lumpectomy with radioactive seed and sentinel lymph node biopsy (Left, 01/14/2021); Portacath placement (Right, 01/14/2021); and Lipoma excision (Left, 01/14/2021).   Her family history includes Arthritis in her sister, sister, and sister; Cancer (age of onset: 60) in her sister; Diabetes in her brother, brother, brother, and father; Diverticulitis in her sister; Hemachromatosis in her brother, sister, sister, and sister; Hypertension in her father and mother; Liver cancer (age of onset: 46) in her maternal grandmother; Lung cancer in her brother; Lupus in her sister; Melanoma (age of onset: 66) in her daughter; Neuropathy in her mother; Stroke in her father and mother.She reports that she has never smoked. She has never used smokeless tobacco. She reports current alcohol use. She reports that she does not use drugs.    ROS Review of Systems  Constitutional: Negative.   HENT: Negative.    Eyes:  Negative for visual disturbance.  Respiratory:  Negative for shortness of breath.   Cardiovascular:  Negative for chest pain.  Gastrointestinal:  Negative for abdominal pain.  Musculoskeletal:  Negative for arthralgias.    Objective:  BP (!) 152/81   Pulse 72   Temp (!) 97.3 F (36.3 C)   Ht '5\' 3"'$  (1.6 m)   Wt 179 lb 9.6 oz (81.5 kg)   SpO2 95%   BMI 31.81 kg/m   BP Readings from Last 3 Encounters:  05/19/22 (!) 152/81  04/24/22 (!) 116/54  04/02/22 131/64  Wt Readings from Last 3 Encounters:  05/19/22 179 lb 9.6 oz (81.5 kg)  04/15/22 174 lb (78.9 kg)  04/02/22 174 lb  (78.9 kg)     Physical Exam Constitutional:      General: She is not in acute distress.    Appearance: She is well-developed.  Cardiovascular:     Rate and Rhythm: Normal rate and regular rhythm.  Pulmonary:     Breath sounds: Normal breath sounds.  Musculoskeletal:        General: Normal range of motion.  Skin:    General: Skin is warm and dry.  Neurological:     Mental Status: She is alert and oriented to person, place, and time.       Assessment & Plan:   Carmen [name] was seen today for medical management of chronic issues.  Diagnoses and all orders for this visit:  Type 2 diabetes mellitus with diabetic neuropathy, without long-term current use of insulin (HCC) -     Bayer DCA Hb A1c Waived -     pregabalin (LYRICA) 50 MG capsule; Take 1 capsule (50 mg total) by mouth 3 (three) times daily.  Mixed hyperlipidemia -     Lipid panel  Hypertension associated with diabetes ([place]) -     CBC with Differential/Platelet -     CMP14+EGFR  Vitamin D deficiency -     VITAMIN D 25 Hydroxy (Vit-D Deficiency, Fractures)  Fatigue, unspecified type -     Vitamin B12  Stage I breast cancer, left (HCC)  Type 2 diabetes mellitus with stage 3b chronic kidney disease, without long-term current use of insulin (HCC)  Other orders -     Semaglutide (RYBELSUS) 14 MG TABS; Take 1 tablet (14 mg total) by mouth daily.       I have discontinued Carmen [name]'s Rybelsus. I have also changed her pregabalin. Additionally, I am having her start on Rybelsus. Lastly, I am having her maintain her aspirin, Accu-Chek Fast[employer]ix Lancet, glucose blood, fluticasone, colchicine, meclizine, Vitamin D3, lidocaine-prilocaine, Insulin Pen Needle, insulin lispro, mupirocin cream, magic mouthwash w/lidocaine, acyclovir, Praluent, glipiZIDE, furosemide, icosapent Ethyl, olmesartan, diclofenac Sodium, neomycin-polymyxin-hydrocortisone, metoprolol tartrate, and allopurinol.  Allergies as of 05/19/2022        Reactions   Zofran [ondansetron Hcl] Other (See Comments)   Severe headache and abdominal pain   Cymbalta [duloxetine Hcl] Other (See Comments)   Profuse sweating   Farxiga [dapagliflozin] Other (See Comments)   Dizziness   Januvia [sitagliptin] Swelling   Meloxicam Nausea And Vomiting   Shellfish Allergy Other (See Comments)   Gout   Trulicity [dulaglutide] Nausea Only   Crestor [rosuvastatin] Other (See Comments)   Muscle aches on 5 mg daily and 10 mg twice weekly   Diclofenac Diarrhea   Lipitor [atorvastatin] Other (See Comments)   Muscle aches   Livalo [pitavastatin] Other (See Comments)      Sulfa Antibiotics Rash   Tape Other (See Comments)   Causes skin irritation   Zetia [ezetimibe] Other (See Comments)   Muscle aches   Zocor [simvastatin] Other (See Comments)   Muscle aches         Medication List        Accurate as of May 19, 2022  8:15 PM. If you have any questions, ask your nurse or doctor.          Accu-Chek [employer] Kit Use to check Blood Sugars   acyclovir 400 MG tablet Commonly known as: ZOVIRAX Take 1 tablet (400 mg  total) by mouth 3 (three) times daily.   allopurinol 100 MG tablet Commonly known as: ZYLOPRIM TAKE ONE TABLET TWICE DAILY   aspirin 81 MG tablet Take 81 mg by mouth daily.   colchicine 0.6 MG tablet TAKE (1) TABLET DAILY AS NEEDED.   diclofenac Sodium 1 % Gel Commonly known as: VOLTAREN APPLY 4 GRAMS TO AFFECTED AREA(S) 4 TIMES A DAY AS NEEDED   fluticasone 50 MCG/ACT nasal spray Commonly known as: FLONASE SPRAY 1 SPRAY IN EACH NOSTRIL ONCE DAILY.   furosemide 20 MG tablet Commonly known as: LASIX Take 1 tablet (20 mg total) by mouth daily.   glipiZIDE 10 MG tablet Commonly known as: GLUCOTROL Take 1 tablet (10 mg total) by mouth 2 (two) times daily before a meal.   glucose blood test strip Commonly known as: ONE TOUCH ULTRA TEST CHECK BLOOD SUGAR 2 TIMES A DAY   icosapent Ethyl 1 g  capsule Commonly known as: VASCEPA TAKE (2) CAPSULES TWICE DAILY.   insulin lispro 100 UNIT/ML KwikPen Commonly known as: HumaLOG KwikPen Max Daily 25 units   Insulin Pen Needle 32G X 4 MM Misc 1 Device by Does not apply route 3 (three) times daily.   lidocaine-prilocaine cream Commonly known as: EMLA Apply 1 application topically as needed.   magic mouthwash w/lidocaine Soln Take 5 mLs by mouth 3 (three) times daily as needed for mouth pain. benadryl  525 mg, hydrocortisone 60 mg and nystatin 0.6 mg.   meclizine 12.5 MG tablet Commonly known as: ANTIVERT Take 1 tablet (12.5 mg total) by mouth 3 (three) times daily as needed for dizziness.   metoprolol tartrate 25 MG tablet Commonly known as: LOPRESSOR TAKE 1 TABLET 2 TIMES A DAY   mupirocin cream 2 % Commonly known as: Bactroban Apply 1 application topically 3 (three) times daily.   neomycin-polymyxin-hydrocortisone OTIC solution Commonly known as: CORTISPORIN Place 4 drops into both ears 4 (four) times daily.   olmesartan 20 MG tablet Commonly known as: BENICAR Take 1 tablet (20 mg total) by mouth daily.   Praluent 150 MG/ML Soaj Generic drug: Alirocumab Inject 1 Dose into the skin every 14 (fourteen) days.   pregabalin 50 MG capsule Commonly known as: LYRICA Take 1 capsule (50 mg total) by mouth 3 (three) times daily. What changed:  how much to take how to take this when to take this additional instructions Changed by: Claretta Fraise, MD   Rybelsus 14 MG Tabs Generic drug: Semaglutide Take 1 tablet (14 mg total) by mouth daily. What changed:  medication strength how much to take when to take this Changed by: Claretta Fraise, MD   Vitamin D3 250 MCG (10000 UT) Tabs Generic drug: Cholecalciferol Take by mouth 3 (three) times a week.         Follow-up: Return in about 3 months (around 08/17/2022).  Claretta Fraise, M.D.

## 2022-05-20 NOTE — Progress Notes (Signed)
Dear [name], Your Vitamin D is  low. You need a prescription strength supplement I will send that in for you. Nurse, if at all possible, could you send in a prescription for the patient for vitamin D 50,000 units, 1 p.o. weekly #13 with 3 refills? Many thanks, WS

## 2022-05-21 ENCOUNTER — Other Ambulatory Visit: Payer: Self-pay | Admitting: *Deleted

## 2022-05-21 MED ORDER — VITAMIN D (ERGOCALCIFEROL) 1.25 MG (50000 UNIT) PO CAPS: 50000.0000 [IU] | ORAL_CAPSULE | ORAL | 3 refills | Status: DC

## 2022-05-23 LAB — LIPID PANEL
Chol/HDL Ratio: 4 ratio (ref 0.0–4.4)
Cholesterol, Total: 163 mg/dL (ref 100–199)
HDL: 41 mg/dL (ref >39)
LDL Chol Calc (NIH): 86 mg/dL (ref 0–99)
Triglycerides: 213 mg/dL — ABNORMAL HIGH (ref 0–149)
VLDL Cholesterol Cal: 36 mg/dL (ref 5–40)

## 2022-05-23 LAB — CBC WITH DIFFERENTIAL/PLATELET
Basophils Absolute: 0 10*3/uL (ref 0.0–0.2)
Basos: 1 %
EOS (ABSOLUTE): 0.2 10*3/uL (ref 0.0–0.4)
Eos: 4 %
Hematocrit: 38.4 % (ref 34.0–46.6)
Hemoglobin: 12.7 g/dL (ref 11.1–15.9)
Immature Grans (Abs): 0 10*3/uL (ref 0.0–0.1)
Immature Granulocytes: 1 %
Lymphocytes Absolute: 1.8 10*3/uL (ref 0.7–3.1)
Lymphs: 29 %
MCH: 30.9 pg (ref 26.6–33.0)
MCHC: 33.1 g/dL (ref 31.5–35.7)
MCV: 93 fL (ref 79–97)
Monocytes Absolute: 0.5 10*3/uL (ref 0.1–0.9)
Monocytes: 8 %
Neutrophils Absolute: 3.6 10*3/uL (ref 1.4–7.0)
Neutrophils: 57 %
Platelets: 143 10*3/uL — ABNORMAL LOW (ref 150–450)
RBC: 4.11 x10E6/uL (ref 3.77–5.28)
RDW: 14 % (ref 11.7–15.4)
WBC: 6.1 10*3/uL (ref 3.4–10.8)

## 2022-05-23 LAB — CMP14+EGFR
ALT: 17 IU/L (ref 0–32)
AST: 17 IU/L (ref 0–40)
Albumin/Globulin Ratio: 1.7 (ref 1.2–2.2)
Albumin: 4.2 g/dL (ref 3.7–4.7)
Alkaline Phosphatase: 106 IU/L (ref 44–121)
BUN/Creatinine Ratio: 19 (ref 12–28)
BUN: 22 mg/dL (ref 8–27)
Bilirubin Total: 0.4 mg/dL (ref 0.0–1.2)
CO2: 25 mmol/L (ref 20–29)
Calcium: 9.8 mg/dL (ref 8.7–10.3)
Chloride: 102 mmol/L (ref 96–106)
Creatinine, Ser: 1.16 mg/dL — ABNORMAL HIGH (ref 0.57–1.00)
Globulin, Total: 2.5 g/dL (ref 1.5–4.5)
Glucose: 123 mg/dL — ABNORMAL HIGH (ref 70–99)
Potassium: 4.4 mmol/L (ref 3.5–5.2)
Sodium: 140 mmol/L (ref 134–144)
Total Protein: 6.7 g/dL (ref 6.0–8.5)
eGFR: 47 mL/min/{1.73_m2} — ABNORMAL LOW (ref >59)

## 2022-05-23 LAB — VITAMIN B12: Vitamin B-12: 367 pg/mL (ref 232–1245)

## 2022-05-23 LAB — VITAMIN D 25 HYDROXY (VIT D DEFICIENCY, FRACTURES): Vit D, 25-Hydroxy: 21.9 ng/mL — ABNORMAL LOW (ref 30.0–100.0)

## 2022-05-26 DIAGNOSIS — B351 Tinea unguium: Secondary | ICD-10-CM | POA: Diagnosis not present

## 2022-05-26 DIAGNOSIS — L84 Corns and callosities: Secondary | ICD-10-CM | POA: Diagnosis not present

## 2022-05-26 DIAGNOSIS — M79676 Pain in unspecified toe(s): Secondary | ICD-10-CM | POA: Diagnosis not present

## 2022-05-26 DIAGNOSIS — E1142 Type 2 diabetes mellitus with diabetic polyneuropathy: Secondary | ICD-10-CM | POA: Diagnosis not present

## 2022-05-28 ENCOUNTER — Encounter: Payer: Self-pay | Admitting: *Deleted

## 2022-05-28 ENCOUNTER — Other Ambulatory Visit: Payer: Self-pay | Admitting: *Deleted

## 2022-05-28 DIAGNOSIS — C50512 Malignant neoplasm of lower-outer quadrant of left female breast: Secondary | ICD-10-CM

## 2022-05-28 DIAGNOSIS — C50912 Malignant neoplasm of unspecified site of left female breast: Secondary | ICD-10-CM

## 2022-05-28 MED ORDER — UNABLE TO FIND: 0 refills | Status: AC

## 2022-05-28 NOTE — Progress Notes (Signed)
Patient called and requests that a prescription for bras be [facility] to [facility] ([phone]). Prescription signed, and faxed as requested.  Oncology Nurse Navigator Documentation     05/28/2022   12:00 PM  Oncology Nurse Navigator Flowsheets  Navigator Location [facility]  Navigator Encounter Type Telephone  Patient Visit Type MedOnc  Treatment Phase Post-Tx Follow-up  Barriers/Navigation Needs No Barriers At This Time  Interventions None Required  Acuity Level 1-No Barriers  Support Groups/Services Friends and Family  Time Spent with Patient 15

## 2022-06-01 ENCOUNTER — Inpatient Hospital Stay: Payer: Medicare Other | Attending: Hematology & Oncology

## 2022-06-01 ENCOUNTER — Other Ambulatory Visit: Payer: Self-pay | Admitting: *Deleted

## 2022-06-01 VITALS — BP 124/39 | HR 69 | Temp 97.9°F | Resp 17

## 2022-06-01 DIAGNOSIS — Z95828 Presence of other vascular implants and grafts: Secondary | ICD-10-CM

## 2022-06-01 DIAGNOSIS — Z853 Personal history of malignant neoplasm of breast: Secondary | ICD-10-CM | POA: Insufficient documentation

## 2022-06-01 MED ORDER — PYRIDOXINE HCL 500 MG PO TABS: 500.0000 mg | ORAL_TABLET | Freq: Every day | ORAL | 3 refills | Status: DC

## 2022-06-01 MED ORDER — SODIUM CHLORIDE 0.9% FLUSH
10.0000 mL | Freq: Once | INTRAVENOUS | Status: AC
  Administered 2022-06-01: 10 mL via INTRAVENOUS

## 2022-06-01 MED ORDER — HEPARIN SOD (PORK) LOCK FLUSH 100 UNIT/ML IV SOLN
500.0000 [IU] | Freq: Once | INTRAVENOUS | Status: AC
  Administered 2022-06-01: 500 [IU] via INTRAVENOUS

## 2022-06-02 ENCOUNTER — Other Ambulatory Visit: Payer: Self-pay | Admitting: *Deleted

## 2022-06-02 DIAGNOSIS — G609 Hereditary and idiopathic neuropathy, unspecified: Secondary | ICD-10-CM

## 2022-06-02 DIAGNOSIS — Z95828 Presence of other vascular implants and grafts: Secondary | ICD-10-CM

## 2022-06-02 MED ORDER — PYRIDOXINE HCL 500 MG PO TABS: 500.0000 mg | ORAL_TABLET | Freq: Every day | ORAL | 3 refills | Status: AC

## 2022-06-02 MED ORDER — LIDOCAINE-PRILOCAINE 2.5-2.5 % EX CREA: 1.0000 | TOPICAL_CREAM | CUTANEOUS | 0 refills | Status: AC | PRN

## 2022-06-03 DIAGNOSIS — C50512 Malignant neoplasm of lower-outer quadrant of left female breast: Secondary | ICD-10-CM | POA: Diagnosis not present

## 2022-06-22 ENCOUNTER — Telehealth: Payer: Self-pay | Admitting: Family Medicine

## 2022-06-22 NOTE — Telephone Encounter (Signed)
Contacted Carmen [name] to schedule their annual wellness visit. Appointment made for 04/21/2023.  Thank you,  [name],  [place] [facility] Direct Dial ??[phone]

## 2022-06-29 ENCOUNTER — Encounter: Payer: Self-pay | Admitting: *Deleted

## 2022-06-30 ENCOUNTER — Ambulatory Visit: Payer: Medicare Other | Admitting: Family Medicine

## 2022-07-13 ENCOUNTER — Other Ambulatory Visit: Payer: Self-pay | Admitting: Family Medicine

## 2022-07-25 ENCOUNTER — Other Ambulatory Visit: Payer: Self-pay | Admitting: Internal Medicine

## 2022-07-27 NOTE — Telephone Encounter (Signed)
Risk factors: CAD s/p CABG, T2DM Hx of gout, statin and ezetimibe intolerance LDLc goal <55 mg/dl  Last LDLc above goal on Praluent 150 mg/dl Z6X. Given hx of gout addition of bempedoic acid would be inappropriate

## 2022-07-31 ENCOUNTER — Inpatient Hospital Stay: Payer: Medicare Other | Attending: Hematology & Oncology

## 2022-07-31 ENCOUNTER — Encounter: Payer: Self-pay | Admitting: Hematology & Oncology

## 2022-07-31 ENCOUNTER — Inpatient Hospital Stay (HOSPITAL_BASED_OUTPATIENT_CLINIC_OR_DEPARTMENT_OTHER): Payer: Medicare Other | Admitting: Hematology & Oncology

## 2022-07-31 ENCOUNTER — Inpatient Hospital Stay: Payer: Medicare Other

## 2022-07-31 ENCOUNTER — Other Ambulatory Visit: Payer: Self-pay

## 2022-07-31 VITALS — BP 96/51 | HR 80 | Temp 98.1°F | Resp 18 | Ht 63.0 in | Wt 178.0 lb

## 2022-07-31 DIAGNOSIS — Z171 Estrogen receptor negative status [ER-]: Secondary | ICD-10-CM | POA: Diagnosis not present

## 2022-07-31 DIAGNOSIS — C50912 Malignant neoplasm of unspecified site of left female breast: Secondary | ICD-10-CM

## 2022-07-31 DIAGNOSIS — E1140 Type 2 diabetes mellitus with diabetic neuropathy, unspecified: Secondary | ICD-10-CM | POA: Insufficient documentation

## 2022-07-31 LAB — CBC WITH DIFFERENTIAL (CANCER CENTER ONLY)
Abs Immature Granulocytes: 0.05 10*3/uL (ref 0.00–0.07)
Basophils Absolute: 0 10*3/uL (ref 0.0–0.1)
Basophils Relative: 1 %
Eosinophils Absolute: 0.2 10*3/uL (ref 0.0–0.5)
Eosinophils Relative: 2 %
HCT: 39.7 % (ref 36.0–46.0)
Hemoglobin: 13.3 g/dL (ref 12.0–15.0)
Immature Granulocytes: 1 %
Lymphocytes Relative: 25 %
Lymphs Abs: 1.9 10*3/uL (ref 0.7–4.0)
MCH: 31.2 pg (ref 26.0–34.0)
MCHC: 33.5 g/dL (ref 30.0–36.0)
MCV: 93.2 fL (ref 80.0–100.0)
Monocytes Absolute: 0.5 10*3/uL (ref 0.1–1.0)
Monocytes Relative: 7 %
Neutro Abs: 4.8 10*3/uL (ref 1.7–7.7)
Neutrophils Relative %: 64 %
Platelet Count: 144 10*3/uL — ABNORMAL LOW (ref 150–400)
RBC: 4.26 MIL/uL (ref 3.87–5.11)
RDW: 14.3 % (ref 11.5–15.5)
WBC Count: 7.5 10*3/uL (ref 4.0–10.5)
nRBC: 0 % (ref 0.0–0.2)

## 2022-07-31 LAB — CMP (CANCER CENTER ONLY)
ALT: 17 U/L (ref 0–44)
AST: 22 U/L (ref 15–41)
Albumin: 4.5 g/dL (ref 3.5–5.0)
Alkaline Phosphatase: 68 U/L (ref 38–126)
Anion gap: 10 (ref 5–15)
BUN: 29 mg/dL — ABNORMAL HIGH (ref 8–23)
CO2: 28 mmol/L (ref 22–32)
Calcium: 10.1 mg/dL (ref 8.9–10.3)
Chloride: 101 mmol/L (ref 98–111)
Creatinine: 1.5 mg/dL — ABNORMAL HIGH (ref 0.44–1.00)
GFR, Estimated: 35 mL/min — ABNORMAL LOW (ref >60)
Glucose, Bld: 219 mg/dL — ABNORMAL HIGH (ref 70–99)
Potassium: 4.1 mmol/L (ref 3.5–5.1)
Sodium: 139 mmol/L (ref 135–145)
Total Bilirubin: 0.6 mg/dL (ref 0.3–1.2)
Total Protein: 7.1 g/dL (ref 6.5–8.1)

## 2022-07-31 LAB — FERRITIN: Ferritin: 63 ng/mL (ref 11–307)

## 2022-07-31 LAB — RETICULOCYTES
Immature Retic Fract: 9.8 % (ref 2.3–15.9)
RBC.: 4.29 MIL/uL (ref 3.87–5.11)
Retic Count, Absolute: 69.5 10*3/uL (ref 19.0–186.0)
Retic Ct Pct: 1.6 % (ref 0.4–3.1)

## 2022-07-31 NOTE — Patient Instructions (Signed)

## 2022-07-31 NOTE — Progress Notes (Signed)
Hematology and Oncology Follow Up Visit  Carmen [name] [id number] Mar 11, 1941 82 y.o. 07/31/2022   Principle Diagnosis:  Invasive ductal carcinoma of the left breast-stage IIA (T2N0M0) -- TRIPLE NEGATIVE -- s/p LEFT lobectomy on 01/14/2021 Hemochromatosis (double heterozygote for C282Y and S65C mutations).   Current Therapy:  Phlebotomy to maintain ferritin less than 100 Adriamycin/Cytoxan - started adjuvant therapy on 02/27/2021, s/p cycle #6/6 -completed on 06/30/2021 XRT to the LEFT breast -- started on 08/06/2021 -- completed 09/23/2021   Interim History:  Carmen [name] is here today for follow-up.  Overall, I think she is doing quite well.  She was last seen back in January.  She has both the breast cancer and the hemochromatosis.  As far as the hemochromatosis is concerned, back in January, her ferritin was 86 with an iron saturation of 47%.  We did go ahead and phlebotomize her.  She is mostly having problems with her blood sugars.  She really needs to have a CGM in my opinion.  Hopefully she will be able to talk to her doctor about this.  She has had no issues with respect to her breast cancer.  She does have fairly aggressive breast cancer with triple negative disease.  She completed her adjuvant chemotherapy back in April 2023.  She has had no cough or shortness of breath.  She has had no change in bowel or bladder habits.  She and her family went down to the beach last weekend.  They had a wonderful time.  She does have some neuropathy in her hands and feet.  I think a lot of this is probably secondary to her diabetes.  She has performed status for now of ECOG 1.     Medications:  Allergies as of 07/31/2022       Reactions   Zofran [ondansetron Hcl] Other (See Comments)   Severe headache and abdominal pain   Cymbalta [duloxetine Hcl] Other (See Comments)   Profuse sweating   Farxiga [dapagliflozin] Other (See Comments)   Dizziness   Januvia [sitagliptin] Swelling    Meloxicam Nausea And Vomiting   Shellfish Allergy Other (See Comments)   Gout   Trulicity [dulaglutide] Nausea Only   Crestor [rosuvastatin] Other (See Comments)   Muscle aches on 5 mg daily and 10 mg twice weekly   Diclofenac Diarrhea   Lipitor [atorvastatin] Other (See Comments)   Muscle aches   Livalo [pitavastatin] Other (See Comments)      Sulfa Antibiotics Rash   Tape Other (See Comments)   Causes skin irritation   Zetia [ezetimibe] Other (See Comments)   Muscle aches   Zocor [simvastatin] Other (See Comments)   Muscle aches         Medication List        Accurate as of Jul 31, 2022  3:14 PM. If you have any questions, ask your nurse or doctor.          Accu-Chek [employer] Kit Use to check Blood Sugars   acyclovir 400 MG tablet Commonly known as: ZOVIRAX Take 1 tablet (400 mg total) by mouth 3 (three) times daily.   allopurinol 100 MG tablet Commonly known as: ZYLOPRIM TAKE ONE TABLET TWICE DAILY   aspirin 81 MG tablet Take 81 mg by mouth daily.   colchicine 0.6 MG tablet TAKE (1) TABLET DAILY AS NEEDED.   diclofenac Sodium 1 % Gel Commonly known as: VOLTAREN APPLY 4 GRAMS TO AFFECTED AREA(S) 4 TIMES A DAY AS NEEDED   fluticasone 50 MCG/ACT  nasal spray Commonly known as: FLONASE SPRAY 1 SPRAY IN EACH NOSTRIL ONCE DAILY.   furosemide 20 MG tablet Commonly known as: LASIX Take 1 tablet (20 mg total) by mouth daily.   glipiZIDE 10 MG tablet Commonly known as: GLUCOTROL Take 1 tablet (10 mg total) by mouth 2 (two) times daily before a meal.   glucose blood test strip Commonly known as: ONE TOUCH ULTRA TEST CHECK BLOOD SUGAR 2 TIMES A DAY   icosapent Ethyl 1 g capsule Commonly known as: VASCEPA TAKE (2) CAPSULES TWICE DAILY.   insulin lispro 100 UNIT/ML KwikPen Commonly known as: HumaLOG KwikPen Max Daily 25 units   Insulin Pen Needle 32G X 4 MM Misc 1 Device by Does not apply route 3 (three) times daily.   lidocaine-prilocaine  cream Commonly known as: EMLA Apply 1 Application topically as needed.   magic mouthwash w/lidocaine Soln Take 5 mLs by mouth 3 (three) times daily as needed for mouth pain. benadryl  525 mg, hydrocortisone 60 mg and nystatin 0.6 mg.   meclizine 12.5 MG tablet Commonly known as: ANTIVERT Take 1 tablet (12.5 mg total) by mouth 3 (three) times daily as needed for dizziness.   metoprolol tartrate 25 MG tablet Commonly known as: LOPRESSOR TAKE 1 TABLET 2 TIMES A DAY   mupirocin cream 2 % Commonly known as: Bactroban Apply 1 application topically 3 (three) times daily.   neomycin-polymyxin-hydrocortisone OTIC solution Commonly known as: CORTISPORIN Place 4 drops into both ears 4 (four) times daily.   olmesartan 20 MG tablet Commonly known as: BENICAR Take 1 tablet (20 mg total) by mouth daily.   Praluent 150 MG/ML Soaj Generic drug: Alirocumab Inject 1 Dose into the skin every 14 (fourteen) days.   pregabalin 50 MG capsule Commonly known as: LYRICA Take 1 capsule (50 mg total) by mouth 3 (three) times daily.   pyridoxine 500 MG tablet Commonly known as: B-6 Take 1 tablet (500 mg total) by mouth daily.   Rybelsus 14 MG Tabs Generic drug: Semaglutide Take 1 tablet (14 mg total) by mouth daily.   UNABLE TO FIND Specialty bra s/p breast cancer surgery   Vitamin D (Ergocalciferol) 1.25 MG (50000 UNIT) Caps capsule Commonly known as: DRISDOL Take 1 capsule (50,000 Units total) by mouth every 7 (seven) days.   Vitamin D3 250 MCG (10000 UT) Tabs Take by mouth 3 (three) times a week.        Allergies:  Allergies  Allergen Reactions   Zofran [Ondansetron Hcl] Other (See Comments)    Severe headache and abdominal pain   Cymbalta [Duloxetine Hcl] Other (See Comments)    Profuse sweating   Farxiga [Dapagliflozin] Other (See Comments)    Dizziness   Januvia [Sitagliptin] Swelling   Meloxicam Nausea And Vomiting   Shellfish Allergy Other (See Comments)    Gout    Trulicity [Dulaglutide] Nausea Only   Crestor [Rosuvastatin] Other (See Comments)    Muscle aches on 5 mg daily and 10 mg twice weekly   Diclofenac Diarrhea   Lipitor [Atorvastatin] Other (See Comments)    Muscle aches   Livalo [Pitavastatin] Other (See Comments)        Sulfa Antibiotics Rash   Tape Other (See Comments)    Causes skin irritation   Zetia [Ezetimibe] Other (See Comments)    Muscle aches   Zocor [Simvastatin] Other (See Comments)    Muscle aches     Past Medical History, Surgical history, Social history, and Family History were reviewed and updated.  Review of Systems: Review of Systems  Constitutional: Negative.   HENT:  Positive for sore throat.   Eyes: Negative.   Respiratory: Negative.    Cardiovascular: Negative.   Gastrointestinal: Negative.   Genitourinary: Negative.   Musculoskeletal: Negative.   Skin: Negative.   Neurological: Negative.   Endo/Heme/Allergies: Negative.   Psychiatric/Behavioral: Negative.       Physical Exam:  height is 5\' 3"  (1.6 m) and weight is 178 lb (80.7 kg). Her oral temperature is 98.1 F (36.7 C). Her blood pressure is 96/51 (abnormal) and her pulse is 80. Her respiration is 18 and oxygen saturation is 95%.   Wt Readings from Last 3 Encounters:  07/31/22 178 lb (80.7 kg)  05/19/22 179 lb 9.6 oz (81.5 kg)  04/15/22 174 lb (78.9 kg)    Physical Exam Vitals reviewed.  HENT:     Head: Normocephalic and atraumatic.  Eyes:     Pupils: Pupils are equal, round, and reactive to light.  Cardiovascular:     Rate and Rhythm: Normal rate and regular rhythm.     Heart sounds: Normal heart sounds.  Pulmonary:     Effort: Pulmonary effort is normal.     Breath sounds: Normal breath sounds.  Abdominal:     General: Bowel sounds are normal.     Palpations: Abdomen is soft.  Musculoskeletal:        General: No tenderness or deformity. Normal range of motion.     Cervical back: Normal range of motion.  Lymphadenopathy:      Cervical: No cervical adenopathy.  Skin:    General: Skin is warm and dry.     Findings: No erythema or rash.  Neurological:     Mental Status: She is alert and oriented to person, place, and time.  Psychiatric:        Behavior: Behavior normal.        Thought Content: Thought content normal.        Judgment: Judgment normal.     Lab Results  Component Value Date   WBC 7.5 07/31/2022   HGB 13.3 07/31/2022   HCT 39.7 07/31/2022   MCV 93.2 07/31/2022   PLT 144 (L) 07/31/2022   Lab Results  Component Value Date   FERRITIN 86 04/02/2022   IRON 139 04/02/2022   TIBC 298 04/02/2022   UIBC 159 04/02/2022   IRONPCTSAT 47 (H) 04/02/2022   Lab Results  Component Value Date   RETICCTPCT 1.6 07/31/2022   RBC 4.26 07/31/2022   RBC 4.29 07/31/2022   No results found for: "KPAFRELGTCHN", "LAMBDASER", "KAPLAMBRATIO" No results found for: "IGGSERUM", "IGA", "IGMSERUM" No results found for: "TOTALPROTELP", "ALBUMINELP", "A1GS", "A2GS", "BETS", "BETA2SER", "GAMS", "MSPIKE", "SPEI"   Chemistry      Component Value Date/Time   NA 140 05/19/2022 1528   NA 147 (H) 01/22/2017 1451   NA 140 12/31/2015 1052   K 4.4 05/19/2022 1528   K 4.9 (H) 01/22/2017 1451   K 4.2 12/31/2015 1052   CL 102 05/19/2022 1528   CL 106 01/22/2017 1451   CO2 25 05/19/2022 1528   CO2 29 01/22/2017 1451   CO2 23 12/31/2015 1052   BUN 22 05/19/2022 1528   BUN 23 (H) 01/22/2017 1451   BUN 21.4 12/31/2015 1052   CREATININE 1.16 (H) 05/19/2022 1528   CREATININE 1.34 (H) 04/02/2022 1210   CREATININE 1.6 (H) 01/22/2017 1451   CREATININE 1.4 (H) 12/31/2015 1052      Component Value Date/Time   CALCIUM  9.8 05/19/2022 1528   CALCIUM 10.3 01/22/2017 1451   CALCIUM 9.7 12/31/2015 1052   ALKPHOS 106 05/19/2022 1528   ALKPHOS 65 01/22/2017 1451   ALKPHOS 87 12/31/2015 1052   AST 17 05/19/2022 1528   AST 18 04/02/2022 1210   AST 31 12/31/2015 1052   ALT 17 05/19/2022 1528   ALT 15 04/02/2022 1210   ALT 25  01/22/2017 1451   ALT 36 12/31/2015 1052   BILITOT 0.4 05/19/2022 1528   BILITOT 0.5 04/02/2022 1210   BILITOT 0.42 12/31/2015 1052       Impression and Plan: Ms. Lengel is a very pleasant 82 yo caucasian female with stage IIA infiltrating ductal carcinoma of the left breast, triple negative.   She completed her adjuvant chemotherapy with A/C in April 2023.  We will have to see what her iron studies look like.  Overall, I think we can probably get her after Summer.  I will see her back after Labor Day.   Josph Macho, MD 5/10/20243:14 PM

## 2022-08-03 ENCOUNTER — Ambulatory Visit (INDEPENDENT_AMBULATORY_CARE_PROVIDER_SITE_OTHER): Payer: Medicare Other | Admitting: Internal Medicine

## 2022-08-03 ENCOUNTER — Telehealth: Payer: Self-pay

## 2022-08-03 ENCOUNTER — Encounter: Payer: Self-pay | Admitting: Family

## 2022-08-03 ENCOUNTER — Encounter: Payer: Self-pay | Admitting: Internal Medicine

## 2022-08-03 VITALS — BP 120/74 | HR 78 | Ht 63.0 in | Wt 178.0 lb

## 2022-08-03 DIAGNOSIS — N1832 Chronic kidney disease, stage 3b: Secondary | ICD-10-CM

## 2022-08-03 DIAGNOSIS — E1122 Type 2 diabetes mellitus with diabetic chronic kidney disease: Secondary | ICD-10-CM

## 2022-08-03 DIAGNOSIS — Z7984 Long term (current) use of oral hypoglycemic drugs: Secondary | ICD-10-CM

## 2022-08-03 LAB — POCT GLUCOSE (DEVICE FOR HOME USE): POC Glucose: 105 mg/dL — AB (ref 70–99)

## 2022-08-03 LAB — IRON AND IRON BINDING CAPACITY (CC-WL,HP ONLY)
Iron: 110 ug/dL (ref 28–170)
Saturation Ratios: 34 % — ABNORMAL HIGH (ref 10.4–31.8)
TIBC: 322 ug/dL (ref 250–450)
UIBC: 212 ug/dL (ref 148–442)

## 2022-08-03 LAB — POCT GLYCOSYLATED HEMOGLOBIN (HGB A1C): Hemoglobin A1C: 6.3 % — AB (ref 4.0–5.6)

## 2022-08-03 MED ORDER — FREESTYLE LIBRE 3 SENSOR MISC: 1.0000 | 11 refills | Status: DC

## 2022-08-03 MED ORDER — RYBELSUS 14 MG PO TABS: 14.0000 mg | ORAL_TABLET | Freq: Every day | ORAL | 3 refills | Status: DC

## 2022-08-03 MED ORDER — GLIPIZIDE 10 MG PO TABS: 10.0000 mg | ORAL_TABLET | Freq: Two times a day (BID) | ORAL | 3 refills | Status: DC

## 2022-08-03 NOTE — Progress Notes (Signed)
Name: Carmen [name]  Age/ Sex: 82 y.o., female   MRN/ DOB: [id number], Sep 01, 1940     PCP: Mechele Claude, MD   Reason for Endocrinology Evaluation: Type 2 Diabetes Mellitus  Initial Endocrine Consultative Visit: 09/14/2018    PATIENT IDENTIFIER: Carmen [name] is a 82 y.o. female with a past medical history of HTN, PVC, neuromuscular disorder, hemochromatosis and CAD (S/P CABG) . The patient has followed with Endocrinology clinic since 09/14/2018 for consultative assistance with management of her diabetes.  DIABETIC HISTORY:  Carmen [name] was diagnosed with T2DM in 2016. Has tried oral glycemic agents in the past,Januvia- swelling , Trulicity - nausea , metformin - elevated LFT's . Her hemoglobin A1c has ranged from 6.6% in 2016, peaking at 8.6% in 2020.  Marcelline Deist stopped 10/2018 due to vertigo   Rybelsus started 2/21  SUBJECTIVE:   During the last visit (01/27/2022): A1c was 5.8%.     Today (08/03/2022): Carmen [name] is here for a follow up on diabetes management.  She checks her blood sugars 2-3 times in week  . The patient has not had hypoglycemic episodes since the last clinic visit.   She is unable to check glucose due to painful neuropathy    She continues to follow-up with oncology for breast cancer and hemochromatosis She has been diagnosed with left breast ca ( invasive breast ca), She is S/P breast sx 01/14/2022. She finished chemo 07/02/2021 . Completed left breast radiation 09/2021  Denies nausea, vomiting or diarrhea    HOME DIABETES REGIMEN:  Glipizide 10 mg twice daily Rybelsus 7 mg daily     METER DOWNLOAD SUMMARY: did not bring    DIABETIC COMPLICATIONS: Microvascular complications:  CKD III, neuropathy  Denies: retinopathy  Last eye exam: Completed 07/28/2022   Macrovascular complications:  CAD (S/P CABG) Denies: PVD, CVA     HISTORY:  Past Medical History:  Past Medical History:  Diagnosis Date   Arthritis    Asymptomatic PVCs     Bilateral shoulder pain    CAD (coronary artery disease)    Cancer (HCC) 11/2015   [id number]  right upper arm   Chronic renal insufficiency, stage 3 (moderate) (HCC)    Diabetes mellitus without complication (HCC)    Diverticulosis    Endometrial polyp    External hemorrhoids    Goals of care, counseling/discussion 02/07/2021   Gout    Hemochromatosis, hereditary (HCC) 11/24/2012   History of anemia    History of duodenal ulcer 1990   History of radiation therapy    Left breast- 08/06/21-09/04/21- Dr. Antony Blackbird   Hyperlipidemia    Hypertension    Internal hemorrhoids    Jaundice    age 60 or 28   Myocardial infarction (HCC) 2007   Neuromuscular disorder (HCC)    peripheral neuropathy   PMB (postmenopausal bleeding)    PONV (postoperative nausea and vomiting)    Prolapse of female pelvic organs    uses pessary   Seizures (HCC)    had one at Dr. Gustavo Lah office after getting blood drawn   Stage I breast cancer, left (HCC) 12/16/2020   Tick bite 08/12/2017   had 3 bites   Type 2 diabetes mellitus with hyperglycemia, without long-term current use of insulin (HCC) 01/27/2019   Vitamin D deficiency    Past Surgical History:  Past Surgical History:  Procedure Laterality Date   BREAST LUMPECTOMY WITH RADIOACTIVE SEED AND SENTINEL LYMPH NODE BIOPSY Left 01/14/2021   Procedure: LEFT BREAST LUMPECTOMY WITH  RADIOACTIVE SEED AND SENTINEL LYMPH NODE BIOPSY;  Surgeon: Griselda Miner, MD;  Location: [facility];  Service: General;  Laterality: Left;   BREAST SURGERY     left breast lump--benign   CARDIAC CATHETERIZATION  06/03/2005   COLONOSCOPY  11/28/2001   CORONARY ARTERY BYPASS GRAFT  2007   x2 Dr. Dorris Fetch, LIMA to LAD, SVG to PDA   DILATATION & CURETTAGE/HYSTEROSCOPY WITH MYOSURE N/A 09/28/2017   Procedure: DILATATION & CURETTAGE/HYSTEROSCOPY WITH MYOSURE;  Surgeon: Richarda Overlie, MD;  Location: [facility];  Service: Gynecology;   Laterality: N/A;   LIPOMA EXCISION     back   LIPOMA EXCISION Left 01/14/2021   Procedure: EXCISION LEFT CHEST WALL LIPOMA;  Surgeon: Griselda Miner, MD;  Location: [facility];  Service: General;  Laterality: Left;   PORTACATH PLACEMENT Right 01/14/2021   Procedure: INSERTION PORT-A-CATH;  Surgeon: Griselda Miner, MD;  Location: [facility];  Service: General;  Laterality: Right;   UPPER GI ENDOSCOPY  01/20/1989   Social History:  reports that she has never smoked. She has never used smokeless tobacco. She reports current alcohol use. She reports that she does not use drugs. Family History:  Family History  Problem Relation Age of Onset   Stroke Mother    Hypertension Mother    Neuropathy Mother    Stroke Father    Hypertension Father    Diabetes Father    Cancer Sister 65       sarcoma   Arthritis Sister    Arthritis Sister    Lupus Sister    Hemachromatosis Sister    Arthritis Sister    Hemachromatosis Sister    Hemachromatosis Sister    Diverticulitis Sister    Diabetes Brother    Diabetes Brother    Diabetes Brother    Hemachromatosis Brother    Lung cancer Brother    Liver cancer Maternal Grandmother 54   Melanoma Daughter 80     HOME MEDICATIONS: Allergies as of 08/03/2022       Reactions   Zofran [ondansetron Hcl] Other (See Comments)   Severe headache and abdominal pain   Cymbalta [duloxetine Hcl] Other (See Comments)   Profuse sweating   Farxiga [dapagliflozin] Other (See Comments)   Dizziness   Januvia [sitagliptin] Swelling   Meloxicam Nausea And Vomiting   Shellfish Allergy Other (See Comments)   Gout   Trulicity [dulaglutide] Nausea Only   Diclofenac Diarrhea   Lipitor [atorvastatin] Other (See Comments)   Muscle aches   Livalo [pitavastatin] Other (See Comments)      Sulfa Antibiotics Rash   Tape Other (See Comments)   Causes skin irritation   Zetia [ezetimibe] Other (See Comments)   Muscle aches   Zocor  [simvastatin] Other (See Comments)   Muscle aches         Medication List        Accurate as of Aug 03, 2022  2:07 PM. If you have any questions, ask your nurse or doctor.          Accu-Chek [employer] Kit Use to check Blood Sugars   allopurinol 100 MG tablet Commonly known as: ZYLOPRIM TAKE ONE TABLET TWICE DAILY   aspirin 81 MG tablet Take 81 mg by mouth daily.   colchicine 0.6 MG tablet TAKE (1) TABLET DAILY AS NEEDED.   diclofenac Sodium 1 % Gel Commonly known as: VOLTAREN APPLY 4 GRAMS TO AFFECTED AREA(S) 4 TIMES A DAY AS NEEDED  furosemide 20 MG tablet Commonly known as: LASIX Take 1 tablet (20 mg total) by mouth daily.   glipiZIDE 10 MG tablet Commonly known as: GLUCOTROL Take 1 tablet (10 mg total) by mouth 2 (two) times daily before a meal.   glucose blood test strip Commonly known as: ONE TOUCH ULTRA TEST CHECK BLOOD SUGAR 2 TIMES A DAY   icosapent Ethyl 1 g capsule Commonly known as: VASCEPA TAKE (2) CAPSULES TWICE DAILY.   lidocaine-prilocaine cream Commonly known as: EMLA Apply 1 Application topically as needed.   meclizine 12.5 MG tablet Commonly known as: ANTIVERT Take 1 tablet (12.5 mg total) by mouth 3 (three) times daily as needed for dizziness.   metoprolol tartrate 25 MG tablet Commonly known as: LOPRESSOR TAKE 1 TABLET 2 TIMES A DAY   neomycin-polymyxin-hydrocortisone OTIC solution Commonly known as: CORTISPORIN Place 4 drops into both ears 4 (four) times daily.   olmesartan 20 MG tablet Commonly known as: BENICAR Take 1 tablet (20 mg total) by mouth daily.   Praluent 150 MG/ML Soaj Generic drug: Alirocumab Inject 1 Dose into the skin every 14 (fourteen) days.   pregabalin 50 MG capsule Commonly known as: LYRICA Take 1 capsule (50 mg total) by mouth 3 (three) times daily.   pyridoxine 500 MG tablet Commonly known as: B-6 Take 1 tablet (500 mg total) by mouth daily.   Rybelsus 14 MG Tabs Generic drug:  Semaglutide Take 1 tablet (14 mg total) by mouth daily.   UNABLE TO FIND Specialty bra s/p breast cancer surgery   Vitamin D (Ergocalciferol) 1.25 MG (50000 UNIT) Caps capsule Commonly known as: DRISDOL Take 1 capsule (50,000 Units total) by mouth every 7 (seven) days.   Vitamin D3 250 MCG (10000 UT) Tabs Take by mouth 3 (three) times a week.         OBJECTIVE:   Vital Signs: BP 120/74 (BP Location: Left Arm, Patient Position: Sitting, Cuff Size: Large)   Pulse 78   Ht 5\' 3"  (1.6 m)   Wt 178 lb (80.7 kg)   SpO2 96%   BMI 31.53 kg/m   Wt Readings from Last 3 Encounters:  08/03/22 178 lb (80.7 kg)  07/31/22 178 lb (80.7 kg)  05/19/22 179 lb 9.6 oz (81.5 kg)     Exam: General: Pt appears well and is in NAD  Lungs: Clear with good BS bilat   Heart: RRR   Extremities: Trace  pretibial edema.  Neuro: MS is good with appropriate affect, pt is alert and Ox3     DM foot exam: 01/27/2022 The skin of the feet is  without sores or ulcerations, there's a blood blister at the tip of the left great toe nail  The pedal pulses are 2+ on right and 2+ on left. The sensation is decreased to a screening 5.07, 10 gram monofilament bilaterally DATA REVIEWED:  Lab Results  Component Value Date   HGBA1C 6.7 (H) 05/19/2022   HGBA1C 5.8 (A) 01/27/2022   HGBA1C 6.7 (A) 07/24/2021    Latest Reference Range & Units 07/31/22 14:40  Sodium 135 - 145 mmol/L 139  Potassium 3.5 - 5.1 mmol/L 4.1  Chloride 98 - 111 mmol/L 101  CO2 22 - 32 mmol/L 28  Glucose 70 - 99 mg/dL 161 (H)  BUN 8 - 23 mg/dL 29 (H)  Creatinine 0.96 - 1.00 mg/dL 0.45 (H)  Calcium 8.9 - 10.3 mg/dL 40.9  Anion gap 5 - 15  10  Alkaline Phosphatase 38 - 126 U/L 68  Albumin 3.5 - 5.0 g/dL 4.5  AST 15 - 41 U/L 22  ALT 0 - 44 U/L 17  Total Protein 6.5 - 8.1 g/dL 7.1  Total Bilirubin 0.3 - 1.2 mg/dL 0.6  GFR, Est Non African American >60 mL/min 35 (L)     In office BG 105 mg/dL   ASSESSMENT / PLAN /  RECOMMENDATIONS:   1) Type 2 Diabetes Mellitus, Optimally  controlled, With neuropathic and CKD III complications - Most recent A1c of 6.3 %. Goal A1c < 7.5 %.    -A1c remains at goal  - She continues to endorse hyperglycemia - postprandial  - Unable to check glucose frequently due to neuropathy with pain  - Freestyle libre sent and sample sensor provided    MEDICATIONS: -Continue Glipizide 10 mg , 1 tab before  Breakfast and 1 tablet before  Supper  - Continue  Rybelsus 14 mg daily with breakfast    EDUCATION / INSTRUCTIONS: BG monitoring instructions: Patient is instructed to check her blood sugars 3 times a week fasting . Call [facility] if: BG persistently < 70  I reviewed the Rule of 15 for the treatment of hypoglycemia in detail with the patient. Literature supplied.    F/U in 6 months   I spent 25 minutes preparing to see the patient by review of recent labs, imaging and procedures, obtaining and reviewing separately obtained history, communicating with the patient, ordering medications, and documenting clinical information in the EHR including the differential Dx, treatment, and any further evaluation and other management   Signed electronically by: Lyndle Herrlich, MD  [facility]  [facility] [address] [place] [place], [place] [address] Phone: [phone] FAX: [phone]   CC: Mechele Claude, MD [address] [place] [address] Phone: [phone]  Fax: [phone]  Return to Endocrinology clinic as below: Future Appointments  Date Time Provider Department Center  08/19/2022  3:40 PM Mechele Claude, MD [facility] None  04/21/2023  2:15 PM [facility]-ANNUAL WELLNESS VISIT [facility] None

## 2022-08-03 NOTE — Patient Instructions (Addendum)
-   Continue Glipizide 10 mg, 1 tablet before breakfast and 1 tablet before supper   - Continue  Rybelsus 14 mg daily with Breakfast      - HOW TO TREAT LOW BLOOD SUGARS (Blood sugar LESS THAN 70 MG/DL) Please follow the RULE OF 15 for the treatment of hypoglycemia treatment (when your (blood sugars are less than 70 mg/dL)   STEP 1: Take 15 grams of carbohydrates when your blood sugar is low, which includes:  3-4 GLUCOSE TABS  OR 3-4 OZ OF JUICE OR REGULAR SODA OR ONE TUBE OF GLUCOSE GEL    STEP 2: RECHECK blood sugar in 15 MINUTES STEP 3: If your blood sugar is still low at the 15 minute recheck --> then, go back to STEP 1 and treat AGAIN with another 15 grams of carbohydrates.

## 2022-08-03 NOTE — Telephone Encounter (Signed)
-----   Message from Josph Macho, MD sent at 08/03/2022  1:20 PM EDT ----- Please call and let her know that the iron saturation is little bit on the higher side.  We probably need to do 1 phlebotomy on her.  Please set this up.  Cindee Lame

## 2022-08-04 DIAGNOSIS — M79676 Pain in unspecified toe(s): Secondary | ICD-10-CM | POA: Diagnosis not present

## 2022-08-04 DIAGNOSIS — B351 Tinea unguium: Secondary | ICD-10-CM | POA: Diagnosis not present

## 2022-08-04 DIAGNOSIS — L84 Corns and callosities: Secondary | ICD-10-CM | POA: Diagnosis not present

## 2022-08-04 DIAGNOSIS — E1142 Type 2 diabetes mellitus with diabetic polyneuropathy: Secondary | ICD-10-CM | POA: Diagnosis not present

## 2022-08-06 ENCOUNTER — Inpatient Hospital Stay: Payer: Medicare Other

## 2022-08-11 ENCOUNTER — Inpatient Hospital Stay: Payer: Medicare Other

## 2022-08-11 DIAGNOSIS — C50912 Malignant neoplasm of unspecified site of left female breast: Secondary | ICD-10-CM | POA: Diagnosis not present

## 2022-08-11 DIAGNOSIS — Z95828 Presence of other vascular implants and grafts: Secondary | ICD-10-CM

## 2022-08-11 DIAGNOSIS — Z171 Estrogen receptor negative status [ER-]: Secondary | ICD-10-CM | POA: Diagnosis not present

## 2022-08-11 DIAGNOSIS — E1140 Type 2 diabetes mellitus with diabetic neuropathy, unspecified: Secondary | ICD-10-CM | POA: Diagnosis not present

## 2022-08-11 MED ORDER — SODIUM CHLORIDE 0.9% FLUSH
10.0000 mL | Freq: Once | INTRAVENOUS | Status: AC
  Administered 2022-08-11: 10 mL via INTRAVENOUS

## 2022-08-11 MED ORDER — HEPARIN SOD (PORK) LOCK FLUSH 100 UNIT/ML IV SOLN
500.0000 [IU] | Freq: Once | INTRAVENOUS | Status: AC
  Administered 2022-08-11: 500 [IU] via INTRAVENOUS

## 2022-08-11 NOTE — Progress Notes (Signed)
Carmen [name] presents today for phlebotomy per MD orders. Phlebotomy procedure started at 14:35 and ended at 14:50. 500 grams removed. Phlebotomy performed through a 19G needle placed in port-a-cath.  Patient observed for 30 minutes after procedure without any incident. Patient tolerated procedure well. Drinks and snacks given post phlebotomy.

## 2022-08-13 ENCOUNTER — Ambulatory Visit: Payer: Medicare Other | Admitting: Family Medicine

## 2022-08-19 ENCOUNTER — Encounter: Payer: Self-pay | Admitting: Family Medicine

## 2022-08-19 ENCOUNTER — Ambulatory Visit (INDEPENDENT_AMBULATORY_CARE_PROVIDER_SITE_OTHER): Payer: Medicare Other | Admitting: Family Medicine

## 2022-08-19 VITALS — BP 122/61 | HR 77 | Temp 97.6°F | Ht 63.0 in | Wt 177.6 lb

## 2022-08-19 DIAGNOSIS — E1142 Type 2 diabetes mellitus with diabetic polyneuropathy: Secondary | ICD-10-CM | POA: Diagnosis not present

## 2022-08-19 DIAGNOSIS — E559 Vitamin D deficiency, unspecified: Secondary | ICD-10-CM

## 2022-08-19 DIAGNOSIS — I152 Hypertension secondary to endocrine disorders: Secondary | ICD-10-CM

## 2022-08-19 DIAGNOSIS — E782 Mixed hyperlipidemia: Secondary | ICD-10-CM

## 2022-08-19 DIAGNOSIS — E1159 Type 2 diabetes mellitus with other circulatory complications: Secondary | ICD-10-CM

## 2022-08-19 DIAGNOSIS — E1140 Type 2 diabetes mellitus with diabetic neuropathy, unspecified: Secondary | ICD-10-CM

## 2022-08-19 DIAGNOSIS — Z7984 Long term (current) use of oral hypoglycemic drugs: Secondary | ICD-10-CM

## 2022-08-19 MED ORDER — PREGABALIN 75 MG PO CAPS: 75.0000 mg | ORAL_CAPSULE | Freq: Three times a day (TID) | ORAL | 1 refills | Status: DC

## 2022-08-19 NOTE — Progress Notes (Signed)
Subjective:  Patient ID: Carmen [name], female    DOB: 1940/07/05  Age: 82 y.o. MRN: [id number]  CC: Medical Management of Chronic Issues     HPI KELCE RIDEAUX presents forFollow-up of diabetes. Patient checks blood sugar at home.  Aic is 6.3 at last check with Dr. Lonzo Cloud this month.  Patient denies symptoms such as polyuria, polydipsia, excessive hunger, nausea No significant hypoglycemic spells noted. Medications reviewed. Pt reports taking them regularly without complication/adverse reaction being reported today.    presents for  follow-up of hypertension. Patient has no history of headache chest pain or shortness of breath or recent cough. Patient also denies symptoms of TIA such as focal numbness or weakness. Patient denies side effects from medication. States taking it regularly.    in for follow-up of elevated cholesterol. Doing well without complaints on current medication. Denies side effects of statin including myalgia and arthralgia and nausea. Currently no chest pain, shortness of breath or other cardiovascular related symptoms noted.  Pt. Also has low Vitamin D. Taking supplement.  History Florabel has a past medical history of Arthritis, Asymptomatic PVCs, Bilateral shoulder pain, CAD (coronary artery disease), Cancer (HCC) (11/2015), Chronic renal insufficiency, stage 3 (moderate) (HCC), Diabetes mellitus without complication (HCC), Diverticulosis, Endometrial polyp, External hemorrhoids, Goals of care, counseling/discussion (02/07/2021), Gout, Hemochromatosis, hereditary (HCC) (11/24/2012), History of anemia, History of duodenal ulcer (1990), History of radiation therapy, Hyperlipidemia, Hypertension, Internal hemorrhoids, Jaundice, Myocardial infarction (HCC) (2007), Neuromuscular disorder (HCC), PMB (postmenopausal bleeding), PONV (postoperative nausea and vomiting), Prolapse of female pelvic organs, Seizures (HCC), Stage I breast cancer, left (HCC) (12/16/2020),  Tick bite (08/12/2017), Type 2 diabetes mellitus with hyperglycemia, without long-term current use of insulin (HCC) (01/27/2019), and Vitamin D deficiency.   She has a past surgical history that includes Lipoma excision; Breast surgery; Cardiac catheterization (06/03/2005); Colonoscopy (11/28/2001); Upper gi endoscopy (01/20/1989); Coronary artery bypass graft (2007); Dilatation & curettage/hysteroscopy with myosure (N/A, 09/28/2017); Breast lumpectomy with radioactive seed and sentinel lymph node biopsy (Left, 01/14/2021); Portacath placement (Right, 01/14/2021); and Lipoma excision (Left, 01/14/2021).   Her family history includes Arthritis in her sister, sister, and sister; Cancer (age of onset: 62) in her sister; Diabetes in her brother, brother, brother, and father; Diverticulitis in her sister; Hemachromatosis in her brother, sister, sister, and sister; Hypertension in her father and mother; Liver cancer (age of onset: 16) in her maternal grandmother; Lung cancer in her brother; Lupus in her sister; Melanoma (age of onset: 98) in her daughter; Neuropathy in her mother; Stroke in her father and mother.She reports that she has never smoked. She has never used smokeless tobacco. She reports current alcohol use. She reports that she does not use drugs.  Current Outpatient Medications on File Prior to Visit  Medication Sig Dispense Refill   allopurinol (ZYLOPRIM) 100 MG tablet TAKE ONE TABLET TWICE DAILY 180 tablet 0   aspirin 81 MG tablet Take 81 mg by mouth daily.     Cholecalciferol (VITAMIN D3) 250 MCG (10000 UT) TABS Take by mouth 3 (three) times a week.     colchicine 0.6 MG tablet TAKE (1) TABLET DAILY AS NEEDED. 30 tablet 1   Continuous Glucose Sensor (FREESTYLE LIBRE 3 SENSOR) MISC 1 Device by Does not apply route every 14 (fourteen) days. Place 1 sensor on the skin every 14 days. Use to check glucose continuously 2 each 11   diclofenac Sodium (VOLTAREN) 1 % GEL APPLY 4 GRAMS TO AFFECTED AREA(S)  4 TIMES A DAY AS NEEDED  350 g 5   furosemide (LASIX) 20 MG tablet Take 1 tablet (20 mg total) by mouth daily. 90 tablet 3   glipiZIDE (GLUCOTROL) 10 MG tablet Take 1 tablet (10 mg total) by mouth 2 (two) times daily before a meal. 180 tablet 3   glucose blood (ONE TOUCH ULTRA TEST) test strip CHECK BLOOD SUGAR 2 TIMES A DAY 100 each prn   icosapent Ethyl (VASCEPA) 1 g capsule TAKE (2) CAPSULES TWICE DAILY. 360 capsule 3   Lancets Misc. (ACCU-CHEK FASTCLIX LANCET) KIT Use to check Blood Sugars 1 kit 0   lidocaine-prilocaine (EMLA) cream Apply 1 Application topically as needed. 30 g 0   meclizine (ANTIVERT) 12.5 MG tablet Take 1 tablet (12.5 mg total) by mouth 3 (three) times daily as needed for dizziness. 30 tablet 5   metoprolol tartrate (LOPRESSOR) 25 MG tablet TAKE 1 TABLET 2 TIMES A DAY 180 tablet 1   neomycin-polymyxin-hydrocortisone (CORTISPORIN) OTIC solution Place 4 drops into both ears 4 (four) times daily. 10 mL 0   olmesartan (BENICAR) 20 MG tablet Take 1 tablet (20 mg total) by mouth daily. 90 tablet 3   PRALUENT 150 MG/ML SOAJ Inject 1 Dose into the skin every 14 (fourteen) days. 2 mL 11   pyridoxine (B-6) 500 MG tablet Take 1 tablet (500 mg total) by mouth daily. 30 tablet 3   Semaglutide (RYBELSUS) 14 MG TABS Take 1 tablet (14 mg total) by mouth daily. 90 tablet 3   UNABLE TO FIND Specialty bra s/p breast cancer surgery 1 Units 0   Vitamin D, Ergocalciferol, (DRISDOL) 1.25 MG (50000 UNIT) CAPS capsule Take 1 capsule (50,000 Units total) by mouth every 7 (seven) days. 13 capsule 3   [DISCONTINUED] prochlorperazine (COMPAZINE) 10 MG tablet Take 1 tablet (10 mg total) by mouth every 6 (six) hours as needed (Nausea or vomiting). 30 tablet 1   No current facility-administered medications on file prior to visit.    ROS Review of Systems  Constitutional: Negative.   HENT: Negative.    Eyes:  Negative for visual disturbance.  Respiratory:  Negative for shortness of breath.    Cardiovascular:  Negative for chest pain.  Gastrointestinal:  Negative for abdominal pain.  Musculoskeletal:  Negative for arthralgias.    Objective:  BP 122/61   Pulse 77   Temp 97.6 F (36.4 C)   Ht 5\' 3"  (1.6 m)   Wt 177 lb 9.6 oz (80.6 kg)   SpO2 96%   BMI 31.46 kg/m   BP Readings from Last 3 Encounters:  08/19/22 122/61  08/11/22 (!) 133/54  08/03/22 120/74    Wt Readings from Last 3 Encounters:  08/19/22 177 lb 9.6 oz (80.6 kg)  08/03/22 178 lb (80.7 kg)  07/31/22 178 lb (80.7 kg)     Physical Exam Constitutional:      General: She is not in acute distress.    Appearance: She is well-developed.  Cardiovascular:     Rate and Rhythm: Normal rate and regular rhythm.  Pulmonary:     Breath sounds: Normal breath sounds.  Musculoskeletal:        General: Normal range of motion.  Skin:    General: Skin is warm and dry.  Neurological:     Mental Status: She is alert and oriented to person, place, and time.       Assessment & Plan:   Markya was seen today for medical management of chronic issues.  Diagnoses and all orders for this visit:  Hypertension  associated with diabetes (HCC)  Vitamin D deficiency  Type 2 diabetes mellitus with diabetic polyneuropathy, without long-term current use of insulin (HCC)  Mixed hyperlipidemia  Type 2 diabetes mellitus with diabetic neuropathy, without long-term current use of insulin (HCC) -     pregabalin (LYRICA) 75 MG capsule; Take 1 capsule (75 mg total) by mouth 3 (three) times daily.      I have changed Deysha E. Soroka's pregabalin. I am also having her maintain her aspirin, Accu-Chek [employer] Lancet, glucose blood, colchicine, meclizine, Vitamin D3, furosemide, icosapent Ethyl, olmesartan, diclofenac Sodium, neomycin-polymyxin-hydrocortisone, metoprolol tartrate, Vitamin D (Ergocalciferol), UNABLE TO FIND, pyridoxine, lidocaine-prilocaine, allopurinol, Praluent, FreeStyle Libre 3 Sensor, glipiZIDE, and  Rybelsus.  Meds ordered this encounter  Medications   pregabalin (LYRICA) 75 MG capsule    Sig: Take 1 capsule (75 mg total) by mouth 3 (three) times daily.    Dispense:  270 capsule    Refill:  1     Follow-up: Return in about 6 months (around 02/19/2023).  Mechele Claude, M.D.

## 2022-08-20 DIAGNOSIS — C50512 Malignant neoplasm of lower-outer quadrant of left female breast: Secondary | ICD-10-CM | POA: Diagnosis not present

## 2022-08-20 DIAGNOSIS — Z171 Estrogen receptor negative status [ER-]: Secondary | ICD-10-CM | POA: Diagnosis not present

## 2022-09-19 ENCOUNTER — Other Ambulatory Visit: Payer: Self-pay | Admitting: Family Medicine

## 2022-10-10 ENCOUNTER — Other Ambulatory Visit: Payer: Self-pay | Admitting: Family Medicine

## 2022-10-13 DIAGNOSIS — E1142 Type 2 diabetes mellitus with diabetic polyneuropathy: Secondary | ICD-10-CM | POA: Diagnosis not present

## 2022-10-13 DIAGNOSIS — B351 Tinea unguium: Secondary | ICD-10-CM | POA: Diagnosis not present

## 2022-10-13 DIAGNOSIS — L84 Corns and callosities: Secondary | ICD-10-CM | POA: Diagnosis not present

## 2022-10-13 DIAGNOSIS — M79676 Pain in unspecified toe(s): Secondary | ICD-10-CM | POA: Diagnosis not present

## 2022-10-20 ENCOUNTER — Inpatient Hospital Stay: Payer: Medicare Other | Attending: Hematology & Oncology

## 2022-10-20 DIAGNOSIS — R42 Dizziness and giddiness: Secondary | ICD-10-CM

## 2022-10-20 DIAGNOSIS — Z171 Estrogen receptor negative status [ER-]: Secondary | ICD-10-CM | POA: Insufficient documentation

## 2022-10-20 DIAGNOSIS — C50912 Malignant neoplasm of unspecified site of left female breast: Secondary | ICD-10-CM | POA: Diagnosis not present

## 2022-10-20 DIAGNOSIS — Z95828 Presence of other vascular implants and grafts: Secondary | ICD-10-CM

## 2022-10-20 MED ORDER — SODIUM CHLORIDE 0.9% FLUSH: 10.0000 mL | INTRAVENOUS | Status: DC | PRN

## 2022-10-20 MED ORDER — HEPARIN SOD (PORK) LOCK FLUSH 100 UNIT/ML IV SOLN: 500.0000 [IU] | Freq: Once | INTRAVENOUS | Status: DC

## 2022-10-20 MED ORDER — HEPARIN SOD (PORK) LOCK FLUSH 100 UNIT/ML IV SOLN
500.0000 [IU] | Freq: Once | INTRAVENOUS | Status: AC
  Administered 2022-10-20: 500 [IU] via INTRAVENOUS

## 2022-10-20 MED ORDER — SODIUM CHLORIDE 0.9% FLUSH
10.0000 mL | INTRAVENOUS | Status: DC | PRN
  Administered 2022-10-20: 10 mL via INTRAVENOUS

## 2022-11-04 ENCOUNTER — Telehealth: Payer: Self-pay | Admitting: Family Medicine

## 2022-11-04 ENCOUNTER — Other Ambulatory Visit: Payer: Self-pay | Admitting: Family Medicine

## 2022-11-04 MED ORDER — CIPROFLOXACIN HCL 500 MG PO TABS: 500.0000 mg | ORAL_TABLET | Freq: Two times a day (BID) | ORAL | 0 refills | Status: DC

## 2022-11-04 NOTE — Telephone Encounter (Signed)
Patient said her brother saw Dr Darlyn Read today (8/13) and she was told by him to call us back because they were all exposed to an infectious disease and her other brother is in the hospital in [place] for this.   She was told by her brother that saw Stacks that he would call in an antibitic if she talked to [name], wants to be called back at [phone]

## 2022-11-04 NOTE — Telephone Encounter (Signed)
Please let the patient know that I sent their prescription to their pharmacy. Thanks, WS 

## 2022-11-04 NOTE — Telephone Encounter (Signed)
Meningitis  No symptoms but a cancer patient. She was with her brother over the weekend.  Carmen [name] - 4/30- not sure of year  Illene Silver- brother Stacks treated.  [facility]

## 2022-11-05 NOTE — Telephone Encounter (Signed)
Patient aware, reports that the meningitis is viral

## 2022-12-14 ENCOUNTER — Inpatient Hospital Stay: Payer: Medicare Other | Attending: Hematology & Oncology

## 2022-12-14 ENCOUNTER — Inpatient Hospital Stay: Payer: Medicare Other

## 2022-12-14 ENCOUNTER — Encounter: Payer: Self-pay | Admitting: Medical Oncology

## 2022-12-14 ENCOUNTER — Other Ambulatory Visit: Payer: Self-pay

## 2022-12-14 ENCOUNTER — Inpatient Hospital Stay (HOSPITAL_BASED_OUTPATIENT_CLINIC_OR_DEPARTMENT_OTHER): Payer: Medicare Other | Admitting: Medical Oncology

## 2022-12-14 VITALS — BP 115/50 | HR 69 | Temp 97.8°F | Resp 19 | Wt 173.0 lb

## 2022-12-14 DIAGNOSIS — Z171 Estrogen receptor negative status [ER-]: Secondary | ICD-10-CM

## 2022-12-14 DIAGNOSIS — R590 Localized enlarged lymph nodes: Secondary | ICD-10-CM

## 2022-12-14 DIAGNOSIS — C50912 Malignant neoplasm of unspecified site of left female breast: Secondary | ICD-10-CM | POA: Insufficient documentation

## 2022-12-14 DIAGNOSIS — C50512 Malignant neoplasm of lower-outer quadrant of left female breast: Secondary | ICD-10-CM | POA: Diagnosis not present

## 2022-12-14 DIAGNOSIS — D696 Thrombocytopenia, unspecified: Secondary | ICD-10-CM | POA: Insufficient documentation

## 2022-12-14 DIAGNOSIS — Z95828 Presence of other vascular implants and grafts: Secondary | ICD-10-CM

## 2022-12-14 DIAGNOSIS — E1140 Type 2 diabetes mellitus with diabetic neuropathy, unspecified: Secondary | ICD-10-CM | POA: Insufficient documentation

## 2022-12-14 LAB — CMP (CANCER CENTER ONLY)
ALT: 15 U/L (ref 0–44)
AST: 18 U/L (ref 15–41)
Albumin: 4 g/dL (ref 3.5–5.0)
Alkaline Phosphatase: 80 U/L (ref 38–126)
Anion gap: 8 (ref 5–15)
BUN: 21 mg/dL (ref 8–23)
CO2: 28 mmol/L (ref 22–32)
Calcium: 9.3 mg/dL (ref 8.9–10.3)
Chloride: 102 mmol/L (ref 98–111)
Creatinine: 1.3 mg/dL — ABNORMAL HIGH (ref 0.44–1.00)
GFR, Estimated: 41 mL/min — ABNORMAL LOW (ref >60)
Glucose, Bld: 141 mg/dL — ABNORMAL HIGH (ref 70–99)
Potassium: 3.9 mmol/L (ref 3.5–5.1)
Sodium: 138 mmol/L (ref 135–145)
Total Bilirubin: 0.5 mg/dL (ref 0.3–1.2)
Total Protein: 6.4 g/dL — ABNORMAL LOW (ref 6.5–8.1)

## 2022-12-14 LAB — CBC WITH DIFFERENTIAL (CANCER CENTER ONLY)
Abs Immature Granulocytes: 0.06 10*3/uL (ref 0.00–0.07)
Basophils Absolute: 0 10*3/uL (ref 0.0–0.1)
Basophils Relative: 0 %
Eosinophils Absolute: 0.2 10*3/uL (ref 0.0–0.5)
Eosinophils Relative: 3 %
HCT: 37.5 % (ref 36.0–46.0)
Hemoglobin: 12.8 g/dL (ref 12.0–15.0)
Immature Granulocytes: 1 %
Lymphocytes Relative: 27 %
Lymphs Abs: 2.1 10*3/uL (ref 0.7–4.0)
MCH: 32.2 pg (ref 26.0–34.0)
MCHC: 34.1 g/dL (ref 30.0–36.0)
MCV: 94.2 fL (ref 80.0–100.0)
Monocytes Absolute: 0.5 10*3/uL (ref 0.1–1.0)
Monocytes Relative: 7 %
Neutro Abs: 4.6 10*3/uL (ref 1.7–7.7)
Neutrophils Relative %: 62 %
Platelet Count: 126 10*3/uL — ABNORMAL LOW (ref 150–400)
RBC: 3.98 MIL/uL (ref 3.87–5.11)
RDW: 14.3 % (ref 11.5–15.5)
WBC Count: 7.5 10*3/uL (ref 4.0–10.5)
nRBC: 0 % (ref 0.0–0.2)

## 2022-12-14 LAB — LACTATE DEHYDROGENASE: LDH: 183 U/L (ref 98–192)

## 2022-12-14 LAB — FERRITIN: Ferritin: 77 ng/mL (ref 11–307)

## 2022-12-14 MED ORDER — HEPARIN SOD (PORK) LOCK FLUSH 100 UNIT/ML IV SOLN
500.0000 [IU] | Freq: Once | INTRAVENOUS | Status: AC
  Administered 2022-12-14: 500 [IU] via INTRAVENOUS

## 2022-12-14 MED ORDER — SODIUM CHLORIDE 0.9% FLUSH
10.0000 mL | INTRAVENOUS | Status: DC | PRN
  Administered 2022-12-14: 10 mL via INTRAVENOUS

## 2022-12-14 NOTE — Progress Notes (Signed)
Hematology and Oncology Follow Up Visit  Carmen [name] [id number] 07-13-1940 82 y.o. 12/14/2022   Principle Diagnosis:  1) Invasive ductal carcinoma of the left breast-stage IIA (T2N0M0) -- TRIPLE NEGATIVE -- s/p LEFT lobectomy on 01/14/2021 2) Hemochromatosis (double heterozygote for C282Y and S65C mutations). 3) Thrombocytopenia   Previous Therapy:  1) Adriamycin/Cytoxan - started adjuvant therapy on 02/27/2021, s/p cycle #6/6 -completed on 06/30/2021 1) XRT to the LEFT breast -- started on 08/06/2021 -- completed 09/23/2021  Current Therapy:  2) Phlebotomy to maintain ferritin less than 100    Interim History:  Carmen [name] is here today for follow-up.  She is followed by Dr. Carolynne Edouard and Dr. Myna Hidalgo for her breast cancer surveillance.   She reports being a bit stressed with some family illnesses.   She has both the breast cancer and the hemochromatosis.  As far as the hemochromatosis is concerned, back in May, her ferritin was 63 with an iron saturation of 34%.   At her last visit she was having troubles controlling her blood sugars. Today she reports that they have been a bit better recently.   She has had no issues with respect to her breast cancer.  She does have fairly aggressive breast cancer with triple negative disease.  She completed her adjuvant chemotherapy back in April 2023. Mammogram 12/2021. No unintentional weight loss, night sweats, new/worsening bone pain.   She has had no cough or shortness of breath.  She has had no change in bowel or bladder habits.    She does have some neuropathy in her hands and feet.  I think a lot of this is probably secondary to her diabetes.  She has performed status for now of ECOG 1.   Wt Readings from Last 3 Encounters:  12/14/22 173 lb (78.5 kg)  08/19/22 177 lb 9.6 oz (80.6 kg)  08/03/22 178 lb (80.7 kg)     Medications:  Allergies as of 12/14/2022       Reactions   Zofran [ondansetron Hcl] Other (See Comments)    Severe headache and abdominal pain   Cymbalta [duloxetine Hcl] Other (See Comments)   Profuse sweating   Farxiga [dapagliflozin] Other (See Comments)   Dizziness   Januvia [sitagliptin] Swelling   Meloxicam Nausea And Vomiting   Shellfish Allergy Other (See Comments)   Gout   Trulicity [dulaglutide] Nausea Only   Diclofenac Diarrhea   Lipitor [atorvastatin] Other (See Comments)   Muscle aches   Livalo [pitavastatin] Other (See Comments)      Sulfa Antibiotics Rash   Tape Other (See Comments)   Causes skin irritation   Zetia [ezetimibe] Other (See Comments)   Muscle aches   Zocor [simvastatin] Other (See Comments)   Muscle aches         Medication List        Accurate as of December 14, 2022  2:05 PM. If you have any questions, ask your nurse or doctor.          Accu-Chek [employer] Kit Use to check Blood Sugars   allopurinol 100 MG tablet Commonly known as: ZYLOPRIM TAKE ONE TABLET TWICE DAILY   aspirin 81 MG tablet Take 81 mg by mouth daily.   ciprofloxacin 500 MG tablet Commonly known as: Cipro Take 1 tablet (500 mg total) by mouth 2 (two) times daily.   colchicine 0.6 MG tablet TAKE (1) TABLET DAILY AS NEEDED.   diclofenac Sodium 1 % Gel Commonly known as: VOLTAREN APPLY 4 GRAMS TO AFFECTED AREA(S)  4 TIMES A DAY AS NEEDED   FreeStyle Libre 3 Sensor Misc 1 Device by Does not apply route every 14 (fourteen) days. Place 1 sensor on the skin every 14 days. Use to check glucose continuously   furosemide 20 MG tablet Commonly known as: LASIX Take 1 tablet (20 mg total) by mouth daily.   glipiZIDE 10 MG tablet Commonly known as: GLUCOTROL Take 1 tablet (10 mg total) by mouth 2 (two) times daily before a meal.   glucose blood test strip Commonly known as: ONE TOUCH ULTRA TEST CHECK BLOOD SUGAR 2 TIMES A DAY   icosapent Ethyl 1 g capsule Commonly known as: VASCEPA TAKE (2) CAPSULES TWICE DAILY.   lidocaine-prilocaine cream Commonly known  as: EMLA Apply 1 Application topically as needed.   meclizine 12.5 MG tablet Commonly known as: ANTIVERT Take 1 tablet (12.5 mg total) by mouth 3 (three) times daily as needed for dizziness.   metoprolol tartrate 25 MG tablet Commonly known as: LOPRESSOR TAKE 1 TABLET 2 TIMES A DAY   neomycin-polymyxin-hydrocortisone OTIC solution Commonly known as: CORTISPORIN Place 4 drops into both ears 4 (four) times daily.   olmesartan 20 MG tablet Commonly known as: BENICAR Take 1 tablet (20 mg total) by mouth daily.   Praluent 150 MG/ML Soaj Generic drug: Alirocumab Inject 1 Dose into the skin every 14 (fourteen) days.   pregabalin 75 MG capsule Commonly known as: LYRICA Take 1 capsule (75 mg total) by mouth 3 (three) times daily.   pyridoxine 500 MG tablet Commonly known as: B-6 Take 1 tablet (500 mg total) by mouth daily.   Rybelsus 14 MG Tabs Generic drug: Semaglutide Take 1 tablet (14 mg total) by mouth daily.   UNABLE TO FIND Specialty bra s/p breast cancer surgery   Vitamin D (Ergocalciferol) 1.25 MG (50000 UNIT) Caps capsule Commonly known as: DRISDOL Take 1 capsule (50,000 Units total) by mouth every 7 (seven) days.   Vitamin D3 250 MCG (10000 UT) Tabs Take by mouth 3 (three) times a week.        Allergies:  Allergies  Allergen Reactions   Zofran [Ondansetron Hcl] Other (See Comments)    Severe headache and abdominal pain   Cymbalta [Duloxetine Hcl] Other (See Comments)    Profuse sweating   Farxiga [Dapagliflozin] Other (See Comments)    Dizziness   Januvia [Sitagliptin] Swelling   Meloxicam Nausea And Vomiting   Shellfish Allergy Other (See Comments)    Gout   Trulicity [Dulaglutide] Nausea Only   Diclofenac Diarrhea   Lipitor [Atorvastatin] Other (See Comments)    Muscle aches   Livalo [Pitavastatin] Other (See Comments)        Sulfa Antibiotics Rash   Tape Other (See Comments)    Causes skin irritation   Zetia [Ezetimibe] Other (See Comments)     Muscle aches   Zocor [Simvastatin] Other (See Comments)    Muscle aches     Past Medical History, Surgical history, Social history, and Family History were reviewed and updated.  Review of Systems: Review of Systems  Constitutional: Negative.   HENT:  Negative for sore throat.   Eyes: Negative.   Respiratory: Negative.    Cardiovascular: Negative.   Gastrointestinal: Negative.   Genitourinary: Negative.   Musculoskeletal: Negative.   Skin: Negative.   Neurological: Negative.   Endo/Heme/Allergies: Negative.   Psychiatric/Behavioral: Negative.       Physical Exam:  weight is 173 lb (78.5 kg). Her oral temperature is 97.8 F (36.6 C). Her  blood pressure is 115/50 (abnormal) and her pulse is 69. Her respiration is 19 and oxygen saturation is 98%.   Wt Readings from Last 3 Encounters:  12/14/22 173 lb (78.5 kg)  08/19/22 177 lb 9.6 oz (80.6 kg)  08/03/22 178 lb (80.7 kg)    Physical Exam Vitals reviewed.  HENT:     Head: Normocephalic and atraumatic.  Eyes:     Pupils: Pupils are equal, round, and reactive to light.  Cardiovascular:     Rate and Rhythm: Normal rate and regular rhythm.     Heart sounds: Normal heart sounds.  Pulmonary:     Effort: Pulmonary effort is normal.     Breath sounds: Normal breath sounds.  Abdominal:     General: Bowel sounds are normal.     Palpations: Abdomen is soft.  Musculoskeletal:        General: No tenderness or deformity. Normal range of motion.     Cervical back: Normal range of motion.  Lymphadenopathy:     Cervical: No cervical adenopathy.  Skin:    General: Skin is warm and dry.     Findings: No erythema or rash.  Neurological:     Mental Status: She is alert and oriented to person, place, and time.  Psychiatric:        Behavior: Behavior normal.        Thought Content: Thought content normal.        Judgment: Judgment normal.      Lab Results  Component Value Date   WBC 7.5 12/14/2022   HGB 12.8 12/14/2022    HCT 37.5 12/14/2022   MCV 94.2 12/14/2022   PLT 126 (L) 12/14/2022   Lab Results  Component Value Date   FERRITIN 63 07/31/2022   IRON 110 07/31/2022   TIBC 322 07/31/2022   UIBC 212 07/31/2022   IRONPCTSAT 34 (H) 07/31/2022   Lab Results  Component Value Date   RETICCTPCT 1.6 07/31/2022   RBC 3.98 12/14/2022   No results found for: "KPAFRELGTCHN", "LAMBDASER", "KAPLAMBRATIO" No results found for: "IGGSERUM", "IGA", "IGMSERUM" No results found for: "TOTALPROTELP", "ALBUMINELP", "A1GS", "A2GS", "BETS", "BETA2SER", "GAMS", "MSPIKE", "SPEI"   Chemistry      Component Value Date/Time   NA 139 07/31/2022 1440   NA 140 05/19/2022 1528   NA 147 (H) 01/22/2017 1451   NA 140 12/31/2015 1052   K 4.1 07/31/2022 1440   K 4.9 (H) 01/22/2017 1451   K 4.2 12/31/2015 1052   CL 101 07/31/2022 1440   CL 106 01/22/2017 1451   CO2 28 07/31/2022 1440   CO2 29 01/22/2017 1451   CO2 23 12/31/2015 1052   BUN 29 (H) 07/31/2022 1440   BUN 22 05/19/2022 1528   BUN 23 (H) 01/22/2017 1451   BUN 21.4 12/31/2015 1052   CREATININE 1.50 (H) 07/31/2022 1440   CREATININE 1.6 (H) 01/22/2017 1451   CREATININE 1.4 (H) 12/31/2015 1052      Component Value Date/Time   CALCIUM 10.1 07/31/2022 1440   CALCIUM 10.3 01/22/2017 1451   CALCIUM 9.7 12/31/2015 1052   ALKPHOS 68 07/31/2022 1440   ALKPHOS 65 01/22/2017 1451   ALKPHOS 87 12/31/2015 1052   AST 22 07/31/2022 1440   AST 31 12/31/2015 1052   ALT 17 07/31/2022 1440   ALT 25 01/22/2017 1451   ALT 36 12/31/2015 1052   BILITOT 0.6 07/31/2022 1440   BILITOT 0.42 12/31/2015 1052      Impression and Plan: Ms. Osthoff is a  very pleasant 82 yo caucasian female with stage IIA infiltrating ductal carcinoma of the left breast, triple negative. She also has Hemochromatosis (double heterozygote for C282Y and S65C mutations).   She completed her adjuvant chemotherapy with A/C in April 2023. She is due to see Dr. Carolynne Edouard in October. She gets her mammograms  at [facility] and she is due for next mammogram in Oct. Today on examination I feel some right axillary adenopathy. I have discussed this with patient and Dr. Myna Hidalgo. At this time we will proceed forward with further breast imaging. Orders placed.   She continues to have mild thrombocytopenia which is stable. Her Hgb today is 12.8. Hct is 37.5. Iron studies pending. Unlikely to need a phlebotomy at this time however if ferritin is greater than 100 this will be recommended.   Disposition Diagnostic mammogram and [name] orders placed RTC 4 months MD, labs ( CBC w/, CMP, iron, ferritin, LDH)-[place]    Rushie Chestnut, PA-C 9/23/20242:05 PM

## 2022-12-15 LAB — IRON AND IRON BINDING CAPACITY (CC-WL,HP ONLY)
Iron: 110 ug/dL (ref 28–170)
Saturation Ratios: 34 % — ABNORMAL HIGH (ref 10.4–31.8)
TIBC: 322 ug/dL (ref 250–450)
UIBC: 212 ug/dL (ref 148–442)

## 2022-12-22 DIAGNOSIS — L84 Corns and callosities: Secondary | ICD-10-CM | POA: Diagnosis not present

## 2022-12-22 DIAGNOSIS — M79676 Pain in unspecified toe(s): Secondary | ICD-10-CM | POA: Diagnosis not present

## 2022-12-22 DIAGNOSIS — B351 Tinea unguium: Secondary | ICD-10-CM | POA: Diagnosis not present

## 2022-12-22 DIAGNOSIS — E1142 Type 2 diabetes mellitus with diabetic polyneuropathy: Secondary | ICD-10-CM | POA: Diagnosis not present

## 2022-12-28 DIAGNOSIS — C50512 Malignant neoplasm of lower-outer quadrant of left female breast: Secondary | ICD-10-CM | POA: Diagnosis not present

## 2022-12-28 DIAGNOSIS — Z171 Estrogen receptor negative status [ER-]: Secondary | ICD-10-CM | POA: Diagnosis not present

## 2022-12-29 ENCOUNTER — Inpatient Hospital Stay: Payer: Medicare Other | Attending: Hematology & Oncology

## 2022-12-29 ENCOUNTER — Other Ambulatory Visit: Payer: Self-pay

## 2022-12-29 ENCOUNTER — Ambulatory Visit (HOSPITAL_BASED_OUTPATIENT_CLINIC_OR_DEPARTMENT_OTHER)
Admission: RE | Admit: 2022-12-29 | Discharge: 2022-12-29 | Disposition: A | Discharge status: Home / Self Care | Payer: Medicare Other | Source: Ambulatory Visit | Attending: Hematology & Oncology | Admitting: Hematology & Oncology

## 2022-12-29 DIAGNOSIS — R42 Dizziness and giddiness: Secondary | ICD-10-CM | POA: Insufficient documentation

## 2022-12-29 DIAGNOSIS — I10 Essential (primary) hypertension: Secondary | ICD-10-CM | POA: Diagnosis not present

## 2022-12-29 DIAGNOSIS — Z171 Estrogen receptor negative status [ER-]: Secondary | ICD-10-CM | POA: Insufficient documentation

## 2022-12-29 DIAGNOSIS — I2510 Atherosclerotic heart disease of native coronary artery without angina pectoris: Secondary | ICD-10-CM | POA: Diagnosis not present

## 2022-12-29 DIAGNOSIS — C50912 Malignant neoplasm of unspecified site of left female breast: Secondary | ICD-10-CM | POA: Insufficient documentation

## 2022-12-29 DIAGNOSIS — I6521 Occlusion and stenosis of right carotid artery: Secondary | ICD-10-CM | POA: Diagnosis not present

## 2022-12-29 DIAGNOSIS — I679 Cerebrovascular disease, unspecified: Secondary | ICD-10-CM | POA: Insufficient documentation

## 2022-12-29 DIAGNOSIS — E1142 Type 2 diabetes mellitus with diabetic polyneuropathy: Secondary | ICD-10-CM

## 2022-12-29 NOTE — Patient Instructions (Signed)

## 2022-12-29 NOTE — Progress Notes (Signed)
Carmen [name] presents today for phlebotomy per MD orders. Phlebotomy procedure started at 1215 and ended at 1245. 500 grams removed from rt [facility] by MMorris, RN. Patient observed for 30 minutes after procedure without any incident. Patient tolerated procedure well. IV needle removed intact.

## 2022-12-31 DIAGNOSIS — Z853 Personal history of malignant neoplasm of breast: Secondary | ICD-10-CM | POA: Diagnosis not present

## 2022-12-31 LAB — HM MAMMOGRAPHY

## 2023-01-05 ENCOUNTER — Other Ambulatory Visit: Payer: Self-pay | Admitting: Family Medicine

## 2023-01-14 DIAGNOSIS — C50512 Malignant neoplasm of lower-outer quadrant of left female breast: Secondary | ICD-10-CM | POA: Diagnosis not present

## 2023-01-26 ENCOUNTER — Telehealth: Payer: Self-pay | Admitting: *Deleted

## 2023-01-26 NOTE — Telephone Encounter (Signed)
TC from [place] w/ [facility] Did quantiflow Mild PAD on LLE - 0.85, RLE was 0.98

## 2023-02-04 ENCOUNTER — Encounter: Payer: Self-pay | Admitting: Internal Medicine

## 2023-02-04 ENCOUNTER — Ambulatory Visit: Payer: Medicare Other | Admitting: Internal Medicine

## 2023-02-04 VITALS — BP 122/80 | HR 60 | Ht 63.0 in | Wt 176.0 lb

## 2023-02-04 DIAGNOSIS — E1122 Type 2 diabetes mellitus with diabetic chronic kidney disease: Secondary | ICD-10-CM | POA: Diagnosis not present

## 2023-02-04 DIAGNOSIS — Z7984 Long term (current) use of oral hypoglycemic drugs: Secondary | ICD-10-CM

## 2023-02-04 DIAGNOSIS — N1832 Chronic kidney disease, stage 3b: Secondary | ICD-10-CM

## 2023-02-04 DIAGNOSIS — C50512 Malignant neoplasm of lower-outer quadrant of left female breast: Secondary | ICD-10-CM | POA: Diagnosis not present

## 2023-02-04 DIAGNOSIS — E1142 Type 2 diabetes mellitus with diabetic polyneuropathy: Secondary | ICD-10-CM

## 2023-02-04 LAB — POCT GLUCOSE (DEVICE FOR HOME USE): POC Glucose: 177 mg/dL — AB (ref 70–99)

## 2023-02-04 LAB — POCT GLYCOSYLATED HEMOGLOBIN (HGB A1C): Hemoglobin A1C: 6.1 % — AB (ref 4.0–5.6)

## 2023-02-04 MED ORDER — ONETOUCH ULTRASOFT LANCETS MISC: 1.0000 | Freq: Every day | 3 refills | Status: AC

## 2023-02-04 MED ORDER — GLIPIZIDE 5 MG PO TABS: 5.0000 mg | ORAL_TABLET | Freq: Two times a day (BID) | ORAL | 3 refills | Status: DC

## 2023-02-04 MED ORDER — RYBELSUS 14 MG PO TABS: 14.0000 mg | ORAL_TABLET | Freq: Every day | ORAL | 3 refills | Status: DC

## 2023-02-04 MED ORDER — GLUCOSE BLOOD VI STRP: 1.0000 | ORAL_STRIP | Freq: Every day | 3 refills | Status: AC

## 2023-02-04 NOTE — Patient Instructions (Signed)
-   Decrease Glipizide 5 mg, 1 tablet before breakfast and 1 tablet before supper   - Continue  Rybelsus 14 mg daily with Breakfast      - HOW TO TREAT LOW BLOOD SUGARS (Blood sugar LESS THAN 70 MG/DL) Please follow the RULE OF 15 for the treatment of hypoglycemia treatment (when your (blood sugars are less than 70 mg/dL)   STEP 1: Take 15 grams of carbohydrates when your blood sugar is low, which includes:  3-4 GLUCOSE TABS  OR 3-4 OZ OF JUICE OR REGULAR SODA OR ONE TUBE OF GLUCOSE GEL    STEP 2: RECHECK blood sugar in 15 MINUTES STEP 3: If your blood sugar is still low at the 15 minute recheck --> then, go back to STEP 1 and treat AGAIN with another 15 grams of carbohydrates.

## 2023-02-04 NOTE — Progress Notes (Signed)
Name: Carmen [name]  Age/ Sex: 82 y.o., female   MRN/ DOB: [id number], [birth year]     PCP: Mechele Claude, MD   Reason for Endocrinology Evaluation: Type 2 Diabetes Mellitus  Initial Endocrine Consultative Visit: 09/14/2018    PATIENT IDENTIFIER: Carmen [name] is a 82 y.o. female with a past medical history of HTN, PVC, neuromuscular disorder, hemochromatosis and CAD (S/P CABG) . The patient has followed with Endocrinology clinic since 09/14/2018 for consultative assistance with management of her diabetes.  DIABETIC HISTORY:  Carmen [name] was diagnosed with T2DM in 2016. Has tried oral glycemic agents in the past,Januvia- swelling , Trulicity - nausea , metformin - elevated LFT's . Her hemoglobin A1c has ranged from 6.6% in 2016, peaking at 8.6% in 2020.  Marcelline Deist stopped 10/2018 due to vertigo   Rybelsus started 2/21  SUBJECTIVE:   During the last visit (08/03/2022): A1c was 6.3%.     Today (02/04/2023): Carmen [name] is here for a follow up on diabetes management.   She is unable to check glucose due to painful neuropathy    She continues to follow-up with oncology for breast cancer and hemochromatosis She was diagnosed with left breast ca ( invasive breast ca), She is S/P breast sx 01/14/2022. She finished chemo 07/02/2021 . Completed left breast radiation 09/2021 She is s/p phlebotomy 12/2022, to maintain ferritin below 100  She has noted with hair loss that she attributes to stress  Denies nausea, vomiting Denies constipation  or diarrhea    HOME DIABETES REGIMEN:  Glipizide 10 mg twice daily Rybelsus 14 mg daily     METER DOWNLOAD SUMMARY: did not bring    DIABETIC COMPLICATIONS: Microvascular complications:  CKD III, neuropathy  Denies: retinopathy  Last eye exam: Completed 07/28/2022   Macrovascular complications:  CAD (S/P CABG) Denies: PVD, CVA     HISTORY:  Past Medical History:  Past Medical History:  Diagnosis Date   Arthritis     Asymptomatic PVCs    Bilateral shoulder pain    CAD (coronary artery disease)    Cancer (HCC) 11/2015   [id number]  right upper arm   Chronic renal insufficiency, stage 3 (moderate) (HCC)    Diabetes mellitus without complication (HCC)    Diverticulosis    Endometrial polyp    External hemorrhoids    Goals of care, counseling/discussion 02/07/2021   Gout    Hemochromatosis, hereditary (HCC) 11/24/2012   History of anemia    History of duodenal ulcer 1990   History of radiation therapy    Left breast- 08/06/21-09/04/21- Dr. Antony Blackbird   Hyperlipidemia    Hypertension    Internal hemorrhoids    Jaundice    age 39 or 19   Myocardial infarction (HCC) 2007   Neuromuscular disorder (HCC)    peripheral neuropathy   PMB (postmenopausal bleeding)    PONV (postoperative nausea and vomiting)    Prolapse of female pelvic organs    uses pessary   Seizures (HCC)    had one at Dr. Gustavo Lah office after getting blood drawn   Stage I breast cancer, left (HCC) 12/16/2020   Tick bite 08/12/2017   had 3 bites   Type 2 diabetes mellitus with hyperglycemia, without long-term current use of insulin (HCC) 01/27/2019   Vitamin D deficiency    Past Surgical History:  Past Surgical History:  Procedure Laterality Date   BREAST LUMPECTOMY WITH RADIOACTIVE SEED AND SENTINEL LYMPH NODE BIOPSY Left 01/14/2021   Procedure: LEFT BREAST LUMPECTOMY WITH  RADIOACTIVE SEED AND SENTINEL LYMPH NODE BIOPSY;  Surgeon: Griselda Miner, MD;  Location: [facility];  Service: General;  Laterality: Left;   BREAST SURGERY     left breast lump--benign   CARDIAC CATHETERIZATION  06/03/2005   COLONOSCOPY  11/28/2001   CORONARY ARTERY BYPASS GRAFT  2007   x2 Dr. Dorris Fetch, LIMA to LAD, SVG to PDA   DILATATION & CURETTAGE/HYSTEROSCOPY WITH MYOSURE N/A 09/28/2017   Procedure: DILATATION & CURETTAGE/HYSTEROSCOPY WITH MYOSURE;  Surgeon: Richarda Overlie, MD;  Location: [facility];  Service:  Gynecology;  Laterality: N/A;   LIPOMA EXCISION     back   LIPOMA EXCISION Left 01/14/2021   Procedure: EXCISION LEFT CHEST WALL LIPOMA;  Surgeon: Griselda Miner, MD;  Location: [facility];  Service: General;  Laterality: Left;   PORTACATH PLACEMENT Right 01/14/2021   Procedure: INSERTION PORT-A-CATH;  Surgeon: Griselda Miner, MD;  Location: [facility];  Service: General;  Laterality: Right;   UPPER GI ENDOSCOPY  01/20/1989   Social History:  reports that she has never smoked. She has never used smokeless tobacco. She reports current alcohol use. She reports that she does not use drugs. Family History:  Family History  Problem Relation Age of Onset   Stroke Mother    Hypertension Mother    Neuropathy Mother    Stroke Father    Hypertension Father    Diabetes Father    Cancer Sister 27       sarcoma   Arthritis Sister    Arthritis Sister    Lupus Sister    Hemachromatosis Sister    Arthritis Sister    Hemachromatosis Sister    Hemachromatosis Sister    Diverticulitis Sister    Diabetes Brother    Diabetes Brother    Diabetes Brother    Hemachromatosis Brother    Lung cancer Brother    Liver cancer Maternal Grandmother 2   Melanoma Daughter 47     HOME MEDICATIONS: Allergies as of 02/04/2023       Reactions   Zofran [ondansetron Hcl] Other (See Comments)   Severe headache and abdominal pain   Cymbalta [duloxetine Hcl] Other (See Comments)   Profuse sweating   Farxiga [dapagliflozin] Other (See Comments)   Dizziness   Januvia [sitagliptin] Swelling   Meloxicam Nausea And Vomiting   Shellfish Allergy Other (See Comments)   Gout   Trulicity [dulaglutide] Nausea Only   Diclofenac Diarrhea   Lipitor [atorvastatin] Other (See Comments)   Muscle aches   Livalo [pitavastatin] Other (See Comments)      Sulfa Antibiotics Rash   Tape Other (See Comments)   Causes skin irritation   Zetia [ezetimibe] Other (See Comments)   Muscle aches    Zocor [simvastatin] Other (See Comments)   Muscle aches         Medication List        Accurate as of February 04, 2023 11:46 AM. If you have any questions, ask your nurse or doctor.          Accu-Chek [employer] Kit Use to check Blood Sugars   allopurinol 100 MG tablet Commonly known as: ZYLOPRIM TAKE ONE TABLET TWICE DAILY   aspirin 81 MG tablet Take 81 mg by mouth daily.   ciprofloxacin 500 MG tablet Commonly known as: Cipro Take 1 tablet (500 mg total) by mouth 2 (two) times daily.   colchicine 0.6 MG tablet TAKE (1) TABLET DAILY AS NEEDED.  diclofenac Sodium 1 % Gel Commonly known as: VOLTAREN APPLY 4 GRAMS TO AFFECTED AREA(S) 4 TIMES A DAY AS NEEDED   FreeStyle Libre 3 Sensor Misc 1 Device by Does not apply route every 14 (fourteen) days. Place 1 sensor on the skin every 14 days. Use to check glucose continuously   furosemide 20 MG tablet Commonly known as: LASIX Take 1 tablet (20 mg total) by mouth daily.   glipiZIDE 10 MG tablet Commonly known as: GLUCOTROL Take 1 tablet (10 mg total) by mouth 2 (two) times daily before a meal.   glucose blood test strip Commonly known as: ONE TOUCH ULTRA TEST CHECK BLOOD SUGAR 2 TIMES A DAY   icosapent Ethyl 1 g capsule Commonly known as: VASCEPA TAKE (2) CAPSULES TWICE DAILY.   lidocaine-prilocaine cream Commonly known as: EMLA Apply 1 Application topically as needed.   meclizine 12.5 MG tablet Commonly known as: ANTIVERT Take 1 tablet (12.5 mg total) by mouth 3 (three) times daily as needed for dizziness.   metoprolol tartrate 25 MG tablet Commonly known as: LOPRESSOR TAKE 1 TABLET 2 TIMES A DAY   neomycin-polymyxin-hydrocortisone OTIC solution Commonly known as: CORTISPORIN Place 4 drops into both ears 4 (four) times daily.   olmesartan 20 MG tablet Commonly known as: BENICAR Take 1 tablet (20 mg total) by mouth daily.   Praluent 150 MG/ML Soaj Generic drug: Alirocumab Inject 1 Dose into  the skin every 14 (fourteen) days.   pregabalin 75 MG capsule Commonly known as: LYRICA Take 1 capsule (75 mg total) by mouth 3 (three) times daily.   pyridoxine 500 MG tablet Commonly known as: B-6 Take 1 tablet (500 mg total) by mouth daily.   Rybelsus 14 MG Tabs Generic drug: Semaglutide Take 1 tablet (14 mg total) by mouth daily.   UNABLE TO FIND Specialty bra s/p breast cancer surgery   Vitamin D (Ergocalciferol) 1.25 MG (50000 UNIT) Caps capsule Commonly known as: DRISDOL Take 1 capsule (50,000 Units total) by mouth every 7 (seven) days.   Vitamin D3 250 MCG (10000 UT) Tabs Take by mouth 3 (three) times a week.         OBJECTIVE:   Vital Signs: BP 122/80 (BP Location: Left Arm, Patient Position: Sitting, Cuff Size: Large)   Pulse 60   Ht 5\' 3"  (1.6 m)   Wt 176 lb (79.8 kg)   SpO2 96%   BMI 31.18 kg/m   Wt Readings from Last 3 Encounters:  02/04/23 176 lb (79.8 kg)  12/14/22 173 lb (78.5 kg)  08/19/22 177 lb 9.6 oz (80.6 kg)     Exam: General: Pt appears well and is in NAD  Lungs: Clear with good BS bilat   Heart: RRR   Extremities: Trace  pretibial edema.  Neuro: MS is good with appropriate affect, pt is alert and Ox3     DM foot exam: 02/04/2023 The skin of the feet is  without sores or ulcerations, there's a blood blister at the tip of the left great toe nail  The pedal pulses are 1+ on right and 1+ on left. The sensation is absent  to a screening 5.07, 10 gram monofilament bilaterally      DATA REVIEWED:  Lab Results  Component Value Date   HGBA1C 6.1 (A) 02/04/2023   HGBA1C 6.3 (A) 08/03/2022   HGBA1C 6.7 (H) 05/19/2022    Latest Reference Range & Units 12/14/22 13:35  Sodium 135 - 145 mmol/L 138  Potassium 3.5 - 5.1 mmol/L 3.9  Chloride 98 - 111 mmol/L 102  CO2 22 - 32 mmol/L 28  Glucose 70 - 99 mg/dL 161 (H)  BUN 8 - 23 mg/dL 21  Creatinine 0.96 - 0.45 mg/dL 4.09 (H)  Calcium 8.9 - 10.3 mg/dL 9.3  Anion gap 5 - 15  8   Alkaline Phosphatase 38 - 126 U/L 80  Albumin 3.5 - 5.0 g/dL 4.0  AST 15 - 41 U/L 18  ALT 0 - 44 U/L 15  Total Protein 6.5 - 8.1 g/dL 6.4 (L)  Total Bilirubin 0.3 - 1.2 mg/dL 0.5  GFR, Est Non African American >60 mL/min 41 (L)     In office BG 177 mg/dL   ASSESSMENT / PLAN / RECOMMENDATIONS:   1) Type 2 Diabetes Mellitus, Optimally  controlled, With neuropathic and CKD III complications - Most recent A1c of 6.1 %. Goal A1c < 7.5 %.    -A1c is skewed due to phlebotomies -A postprandial BG 177 Mg/DL - Unable to check glucose frequently due to neuropathic pain - Freestyle libre not covered by insurance -I have recommended decreasing glipizide as below  MEDICATIONS: -Decrease Glipizide 5 mg , 1 tab before  Breakfast and 1 tablet before  Supper  - Continue  Rybelsus 14 mg daily with breakfast    EDUCATION / INSTRUCTIONS: BG monitoring instructions: Patient is instructed to check her blood sugars 3 times a week fasting . Call [place] [facility] if: BG persistently < 70  I reviewed the Rule of 15 for the treatment of hypoglycemia in detail with the patient. Literature supplied.    F/U in 6 months   I spent 25 minutes preparing to see the patient by review of recent labs, imaging and procedures, obtaining and reviewing separately obtained history, communicating with the patient, ordering medications, tests or procedures, and documenting clinical information in the EHR including the differential Dx, treatment, and any further evaluation and other management   Signed electronically by: Lyndle Herrlich, MD  [facility] End[facility]  [facility] [address] [place], [place] [address] Phone: [phone] FAX: [phone]   CC: Mechele Claude, MD [address] [place] [address] Phone: [phone]  Fax: [phone]  Return to Endocrinology clinic as below: Future Appointments  Date Time Provider Department Center  02/08/2023   1:30 PM [facility] INJ NURSE [facility] None  02/24/2023  3:10 PM Mechele Claude, MD [facility] None  04/16/2023  1:15 PM [facility] LAB [facility] None  04/16/2023  1:30 PM [facility] INJ NURSE [facility] None  04/16/2023  1:45 PM Josph Macho, MD [facility] None  04/21/2023  2:30 PM [facility]NUAL WELLNESS VISIT [facility] None  04/23/2023  1:40 PM Chrystie Nose, MD [facility] None  06/01/2023  1:30 PM [facility] INJ NURSE [facility] None  07/26/2023  1:30 PM [facility] INJ NURSE [facility] None  09/20/2023  1:30 PM [facility] INJ NURSE [facility] None  11/08/2023  1:30 PM [facility] INJ NURSE [facility] None

## 2023-02-08 ENCOUNTER — Inpatient Hospital Stay: Payer: Medicare Other | Attending: Hematology & Oncology

## 2023-02-08 VITALS — BP 150/68 | HR 68 | Temp 97.9°F | Resp 18

## 2023-02-08 DIAGNOSIS — D696 Thrombocytopenia, unspecified: Secondary | ICD-10-CM | POA: Diagnosis not present

## 2023-02-08 DIAGNOSIS — C50512 Malignant neoplasm of lower-outer quadrant of left female breast: Secondary | ICD-10-CM

## 2023-02-08 DIAGNOSIS — Z853 Personal history of malignant neoplasm of breast: Secondary | ICD-10-CM | POA: Diagnosis not present

## 2023-02-08 MED ORDER — HEPARIN SOD (PORK) LOCK FLUSH 100 UNIT/ML IV SOLN
500.0000 [IU] | Freq: Once | INTRAVENOUS | Status: AC
  Administered 2023-02-08: 500 [IU] via INTRAVENOUS

## 2023-02-08 MED ORDER — SODIUM CHLORIDE 0.9% FLUSH
10.0000 mL | INTRAVENOUS | Status: DC | PRN
  Administered 2023-02-08: 10 mL via INTRAVENOUS

## 2023-02-08 NOTE — Patient Instructions (Signed)

## 2023-02-11 ENCOUNTER — Telehealth: Payer: Self-pay

## 2023-02-11 NOTE — Telephone Encounter (Signed)
Rybelsus needs PA  

## 2023-02-12 ENCOUNTER — Other Ambulatory Visit (HOSPITAL_COMMUNITY): Payer: Self-pay

## 2023-02-12 ENCOUNTER — Encounter: Payer: Self-pay | Admitting: Family

## 2023-02-15 ENCOUNTER — Telehealth: Payer: Self-pay

## 2023-02-15 ENCOUNTER — Other Ambulatory Visit (HOSPITAL_COMMUNITY): Payer: Self-pay

## 2023-02-15 NOTE — Telephone Encounter (Signed)
Pharmacy Patient Advocate Encounter   Received notification from Pt Calls Messages that prior authorization for Rybelsus is required/requested.   Insurance verification completed.   The patient is insured through [facility] Medicare Part D.   Per test claim: Refill too soon. PA is not needed at this time. Medication was filled 02/03/23. Next eligible fill date is 04/12/23.

## 2023-02-24 ENCOUNTER — Ambulatory Visit: Payer: Medicare Other | Admitting: Family Medicine

## 2023-02-24 ENCOUNTER — Ambulatory Visit (INDEPENDENT_AMBULATORY_CARE_PROVIDER_SITE_OTHER): Payer: Medicare Other | Admitting: Family Medicine

## 2023-02-24 ENCOUNTER — Encounter: Payer: Self-pay | Admitting: Family Medicine

## 2023-02-24 VITALS — BP 125/66 | HR 72 | Temp 97.9°F | Ht 63.0 in | Wt 181.6 lb

## 2023-02-24 DIAGNOSIS — E1140 Type 2 diabetes mellitus with diabetic neuropathy, unspecified: Secondary | ICD-10-CM

## 2023-02-24 DIAGNOSIS — E782 Mixed hyperlipidemia: Secondary | ICD-10-CM

## 2023-02-24 DIAGNOSIS — E1142 Type 2 diabetes mellitus with diabetic polyneuropathy: Secondary | ICD-10-CM | POA: Diagnosis not present

## 2023-02-24 DIAGNOSIS — E1159 Type 2 diabetes mellitus with other circulatory complications: Secondary | ICD-10-CM | POA: Diagnosis not present

## 2023-02-24 DIAGNOSIS — R5383 Other fatigue: Secondary | ICD-10-CM

## 2023-02-24 DIAGNOSIS — I152 Hypertension secondary to endocrine disorders: Secondary | ICD-10-CM

## 2023-02-24 LAB — BAYER DCA HB A1C WAIVED: HB A1C (BAYER DCA - WAIVED): 6.1 % — ABNORMAL HIGH (ref 4.8–5.6)

## 2023-02-24 MED ORDER — PREGABALIN 75 MG PO CAPS: 75.0000 mg | ORAL_CAPSULE | Freq: Three times a day (TID) | ORAL | 1 refills | Status: DC

## 2023-02-24 MED ORDER — MOMETASONE FUROATE 0.1 % EX CREA: TOPICAL_CREAM | Freq: Every day | CUTANEOUS | 1 refills | Status: AC

## 2023-02-24 NOTE — Progress Notes (Signed)
Subjective:  Patient ID: Carmen [name],  female    DOB: January 09, 1941  Age: 82 y.o.    CC: Medical Management of Chronic Issues   HPI Carmen [name] presents for  follow-up of hypertension. Patient has no history of headache chest pain or shortness of breath or recent cough. Patient also denies symptoms of TIA such as numbness weakness lateralizing. Patient denies side effects from medication. States taking it regularly.  Patient also  in for follow-up of elevated cholesterol. Doing well without complaints on current medication. Denies side effects  including myalgia and arthralgia and nausea. Also in today for liver function testing. Currently no chest pain, shortness of breath or other cardiovascular related symptoms noted.  Follow-up of diabetes. Patient does check blood sugar at home. Readings run between 120 and 140 Patient denies symptoms such as excessive hunger or urinary frequency, excessive hunger, nausea No significant hypoglycemic spells noted. Has neuropathy that impacts balance.  Medications reviewed. Pt reports taking them regularly. Pt. denies complication/adverse reaction today.    History Carmen [name] has a past medical history of Arthritis, Asymptomatic PVCs, Bilateral shoulder pain, CAD (coronary artery disease), Cancer (HCC) (11/2015), Chronic renal insufficiency, stage 3 (moderate) (HCC), Diabetes mellitus without complication (HCC), Diverticulosis, Endometrial polyp, External hemorrhoids, Goals of care, counseling/discussion (02/07/2021), Gout, Hemochromatosis, hereditary (HCC) (11/24/2012), History of anemia, History of duodenal ulcer (1990), History of radiation therapy, Hyperlipidemia, Hypertension, Internal hemorrhoids, Jaundice, Myocardial infarction (HCC) (2007), Neuromuscular disorder (HCC), PMB (postmenopausal bleeding), PONV (postoperative nausea and vomiting), Prolapse of female pelvic organs, Seizures (HCC), Stage I breast cancer, left (HCC) (12/16/2020),  Tick bite (08/12/2017), Type 2 diabetes mellitus with hyperglycemia, without long-term current use of insulin (HCC) (01/27/2019), and Vitamin D deficiency.   She has a past surgical history that includes Lipoma excision; Breast surgery; Cardiac catheterization (06/03/2005); Colonoscopy (11/28/2001); Upper gi endoscopy (01/20/1989); Coronary artery bypass graft (2007); Dilatation & curettage/hysteroscopy with myosure (N/A, 09/28/2017); Breast lumpectomy with radioactive seed and sentinel lymph node biopsy (Left, 01/14/2021); Portacath placement (Right, 01/14/2021); and Lipoma excision (Left, 01/14/2021).   Her family history includes Arthritis in her sister, sister, and sister; Cancer (age of onset: 71) in her sister; Diabetes in her brother, brother, brother, and father; Diverticulitis in her sister; Hemachromatosis in her brother, sister, sister, and sister; Hypertension in her father and mother; Liver cancer (age of onset: 19) in her maternal grandmother; Lung cancer in her brother; Lupus in her sister; Melanoma (age of onset: 53) in her daughter; Neuropathy in her mother; Stroke in her father and mother.She reports that she has never smoked. She has never used smokeless tobacco. She reports current alcohol use. She reports that she does not use drugs.  Current Outpatient Medications on File Prior to Visit  Medication Sig Dispense Refill   allopurinol (ZYLOPRIM) 100 MG tablet TAKE ONE TABLET TWICE DAILY 180 tablet 0   aspirin 81 MG tablet Take 81 mg by mouth daily.     Cholecalciferol (VITAMIN D3) 250 MCG (10000 UT) TABS Take by mouth 3 (three) times a week.     ciprofloxacin (CIPRO) 500 MG tablet Take 1 tablet (500 mg total) by mouth 2 (two) times daily. 14 tablet 0   colchicine 0.6 MG tablet TAKE (1) TABLET DAILY AS NEEDED. 30 tablet 1   diclofenac Sodium (VOLTAREN) 1 % GEL APPLY 4 GRAMS TO AFFECTED AREA(S) 4 TIMES A DAY AS NEEDED 350 g 5   furosemide (LASIX) 20 MG tablet Take 1 tablet (20 mg total)  by mouth daily.  90 tablet 3   glipiZIDE (GLUCOTROL) 5 MG tablet Take 1 tablet (5 mg total) by mouth 2 (two) times daily before a meal. 180 tablet 3   glucose blood (ONE TOUCH ULTRA TEST) test strip 1 each by Other route daily in the afternoon. CHECK BLOOD SUGAR 2 TIMES A DAY 100 each 3   icosapent Ethyl (VASCEPA) 1 g capsule TAKE (2) CAPSULES TWICE DAILY. 360 capsule 3   Lancets (ONETOUCH ULTRASOFT) lancets 1 each by Other route daily in the afternoon. Use as instructed 100 each 3   Lancets Misc. (ACCU-CHEK FASTCLIX LANCET) KIT Use to check Blood Sugars 1 kit 0   lidocaine-prilocaine (EMLA) cream Apply 1 Application topically as needed. 30 g 0   meclizine (ANTIVERT) 12.5 MG tablet Take 1 tablet (12.5 mg total) by mouth 3 (three) times daily as needed for dizziness. 30 tablet 5   metoprolol tartrate (LOPRESSOR) 25 MG tablet TAKE 1 TABLET 2 TIMES A DAY 180 tablet 1   neomycin-polymyxin-hydrocortisone (CORTISPORIN) OTIC solution Place 4 drops into both ears 4 (four) times daily. 10 mL 0   olmesartan (BENICAR) 20 MG tablet Take 1 tablet (20 mg total) by mouth daily. 90 tablet 3   PRALUENT 150 MG/ML SOAJ Inject 1 Dose into the skin every 14 (fourteen) days. 2 mL 11   pyridoxine (B-6) 500 MG tablet Take 1 tablet (500 mg total) by mouth daily. 30 tablet 3   Semaglutide (RYBELSUS) 14 MG TABS Take 1 tablet (14 mg total) by mouth daily. 90 tablet 3   UNABLE TO FIND Specialty bra s/p breast cancer surgery 1 Units 0   Vitamin D, Ergocalciferol, (DRISDOL) 1.25 MG (50000 UNIT) CAPS capsule Take 1 capsule (50,000 Units total) by mouth every 7 (seven) days. 13 capsule 3   [DISCONTINUED] prochlorperazine (COMPAZINE) 10 MG tablet Take 1 tablet (10 mg total) by mouth every 6 (six) hours as needed (Nausea or vomiting). 30 tablet 1   No current facility-administered medications on file prior to visit.    ROS Review of Systems  Constitutional: Negative.   HENT: Negative.    Eyes:  Negative for visual disturbance.   Respiratory:  Negative for shortness of breath.   Cardiovascular:  Negative for chest pain.  Gastrointestinal:  Negative for abdominal pain.  Musculoskeletal:  Negative for arthralgias.       Right shoulder goes to sleep at night. Better if she hangs the arm over the side of the bed.   Skin:        Hasn't come back very much since chemo.    Neurological:        Pt. Reports poor balance     Objective:  BP 125/66   Pulse 72   Temp 97.9 F (36.6 C)   Ht 5\' 3"  (1.6 m)   Wt 181 lb 9.6 oz (82.4 kg)   SpO2 94%   BMI 32.17 kg/m   BP Readings from Last 3 Encounters:  02/24/23 125/66  02/08/23 (!) 150/68  02/04/23 122/80    Wt Readings from Last 3 Encounters:  02/24/23 181 lb 9.6 oz (82.4 kg)  02/04/23 176 lb (79.8 kg)  12/14/22 173 lb (78.5 kg)     Physical Exam Constitutional:      General: She is not in acute distress.    Appearance: She is well-developed.  Cardiovascular:     Rate and Rhythm: Normal rate and regular rhythm.  Pulmonary:     Breath sounds: Normal breath sounds.  Musculoskeletal:  General: Normal range of motion.  Skin:    General: Skin is warm and dry.  Neurological:     Mental Status: She is alert and oriented to person, place, and time.     Diabetic Foot Exam - Simple   No data filed     Lab Results  Component Value Date   HGBA1C 6.1 (H) 02/24/2023   HGBA1C 6.1 (A) 02/04/2023   HGBA1C 6.3 (A) 08/03/2022    Assessment & Plan:   Ferrell was seen today for medical management of chronic issues.  Diagnoses and all orders for this visit:  Type 2 diabetes mellitus with diabetic polyneuropathy, without long-term current use of insulin (HCC) -     Bayer DCA Hb A1c Waived -     Microalbumin / creatinine urine ratio  Hypertension associated with diabetes (HCC) -     Bayer DCA Hb A1c Waived -     CBC with Differential/Platelet -     CMP14+EGFR  Mixed hyperlipidemia -     Bayer DCA Hb A1c Waived -     Lipid panel  Fatigue,  unspecified type -     Magnesium -     TSH  Type 2 diabetes mellitus with diabetic neuropathy, without long-term current use of insulin (HCC) -     pregabalin (LYRICA) 75 MG capsule; Take 1 capsule (75 mg total) by mouth 3 (three) times daily.  Other orders -     mometasone (ELOCON) 0.1 % cream; Apply topically daily.   I am having Carmen [name] start on mometasone. I am also having her maintain her aspirin, Accu-Chek FastClix Lancet, colchicine, meclizine, Vitamin D3, furosemide, icosapent Ethyl, olmesartan, diclofenac Sodium, neomycin-polymyxin-hydrocortisone, Vitamin D (Ergocalciferol), UNABLE TO FIND, pyridoxine, lidocaine-prilocaine, Praluent, metoprolol tartrate, ciprofloxacin, allopurinol, Rybelsus, glipiZIDE, onetouch ultrasoft, glucose blood, and pregabalin.  Meds ordered this encounter  Medications   pregabalin (LYRICA) 75 MG capsule    Sig: Take 1 capsule (75 mg total) by mouth 3 (three) times daily.    Dispense:  270 capsule    Refill:  1   mometasone (ELOCON) 0.1 % cream    Sig: Apply topically daily.    Dispense:  15 g    Refill:  1     Follow-up: Return in about 3 months (around 05/25/2023).  Mechele Claude, M.D.

## 2023-02-25 LAB — CBC WITH DIFFERENTIAL/PLATELET
Basophils Absolute: 0 10*3/uL (ref 0.0–0.2)
Basos: 0 %
EOS (ABSOLUTE): 0.3 10*3/uL (ref 0.0–0.4)
Eos: 3 %
Hematocrit: 41.2 % (ref 34.0–46.6)
Hemoglobin: 13.9 g/dL (ref 11.1–15.9)
Immature Grans (Abs): 0.1 10*3/uL (ref 0.0–0.1)
Immature Granulocytes: 1 %
Lymphocytes Absolute: 2.5 10*3/uL (ref 0.7–3.1)
Lymphs: 32 %
MCH: 32.4 pg (ref 26.6–33.0)
MCHC: 33.7 g/dL (ref 31.5–35.7)
MCV: 96 fL (ref 79–97)
Monocytes Absolute: 0.6 10*3/uL (ref 0.1–0.9)
Monocytes: 8 %
Neutrophils Absolute: 4.4 10*3/uL (ref 1.4–7.0)
Neutrophils: 56 %
Platelets: 145 10*3/uL — ABNORMAL LOW (ref 150–450)
RBC: 4.29 x10E6/uL (ref 3.77–5.28)
RDW: 13.2 % (ref 11.7–15.4)
WBC: 7.9 10*3/uL (ref 3.4–10.8)

## 2023-02-25 LAB — TSH: TSH: 2.76 u[IU]/mL (ref 0.450–4.500)

## 2023-02-25 LAB — CMP14+EGFR
ALT: 23 [IU]/L (ref 0–32)
AST: 31 [IU]/L (ref 0–40)
Albumin: 4.2 g/dL (ref 3.7–4.7)
Alkaline Phosphatase: 119 [IU]/L (ref 44–121)
BUN/Creatinine Ratio: 17 (ref 12–28)
BUN: 21 mg/dL (ref 8–27)
Bilirubin Total: 0.2 mg/dL (ref 0.0–1.2)
CO2: 26 mmol/L (ref 20–29)
Calcium: 10 mg/dL (ref 8.7–10.3)
Chloride: 101 mmol/L (ref 96–106)
Creatinine, Ser: 1.24 mg/dL — ABNORMAL HIGH (ref 0.57–1.00)
Globulin, Total: 2.8 g/dL (ref 1.5–4.5)
Glucose: 112 mg/dL — ABNORMAL HIGH (ref 70–99)
Potassium: 4.2 mmol/L (ref 3.5–5.2)
Sodium: 143 mmol/L (ref 134–144)
Total Protein: 7 g/dL (ref 6.0–8.5)
eGFR: 43 mL/min/{1.73_m2} — ABNORMAL LOW (ref >59)

## 2023-02-25 LAB — LIPID PANEL
Chol/HDL Ratio: 3.3 {ratio} (ref 0.0–4.4)
Cholesterol, Total: 147 mg/dL (ref 100–199)
HDL: 44 mg/dL (ref >39)
LDL Chol Calc (NIH): 70 mg/dL (ref 0–99)
Triglycerides: 199 mg/dL — ABNORMAL HIGH (ref 0–149)
VLDL Cholesterol Cal: 33 mg/dL (ref 5–40)

## 2023-02-25 LAB — MICROALBUMIN / CREATININE URINE RATIO
Creatinine, Urine: 30.3 mg/dL
Microalb/Creat Ratio: <10 mg/g{creat} (ref 0–29)
Microalbumin, Urine: <3 ug/mL

## 2023-02-25 LAB — MAGNESIUM: Magnesium: 1.7 mg/dL (ref 1.6–2.3)

## 2023-02-26 ENCOUNTER — Encounter: Payer: Self-pay | Admitting: Family Medicine

## 2023-02-28 NOTE — Progress Notes (Signed)
Hello Velicia,  Your lab result is normal and/or stable.Some minor variations that are not significant are commonly marked abnormal, but do not represent any medical problem for you.  Best regards, Claretta Fraise, M.D.

## 2023-03-01 ENCOUNTER — Ambulatory Visit: Payer: Medicare Other | Attending: Internal Medicine | Admitting: Internal Medicine

## 2023-03-01 ENCOUNTER — Encounter: Payer: Self-pay | Admitting: Internal Medicine

## 2023-03-01 VITALS — BP 135/79 | HR 68 | Ht 63.0 in | Wt 184.0 lb

## 2023-03-01 DIAGNOSIS — E1159 Type 2 diabetes mellitus with other circulatory complications: Secondary | ICD-10-CM | POA: Diagnosis not present

## 2023-03-01 DIAGNOSIS — M791 Myalgia, unspecified site: Secondary | ICD-10-CM | POA: Diagnosis not present

## 2023-03-01 DIAGNOSIS — T466X5D Adverse effect of antihyperlipidemic and antiarteriosclerotic drugs, subsequent encounter: Secondary | ICD-10-CM | POA: Diagnosis not present

## 2023-03-01 DIAGNOSIS — I25810 Atherosclerosis of coronary artery bypass graft(s) without angina pectoris: Secondary | ICD-10-CM | POA: Diagnosis not present

## 2023-03-01 DIAGNOSIS — I152 Hypertension secondary to endocrine disorders: Secondary | ICD-10-CM | POA: Diagnosis not present

## 2023-03-01 DIAGNOSIS — T466X5A Adverse effect of antihyperlipidemic and antiarteriosclerotic drugs, initial encounter: Secondary | ICD-10-CM

## 2023-03-01 NOTE — Patient Instructions (Signed)
Medication Instructions:  NO CHANGES  *If you need a refill on your cardiac medications before your next appointment, please call your pharmacy*   Follow-Up: At [facility], you and your health needs are our priority.  As part of our continuing mission to provide you with exceptional heart care, we have created designated Provider Care Teams.  These Care Teams include your primary Cardiologist (physician) and Advanced Practice Providers (APPs -  Physician Assistants and Nurse Practitioners) who all work together to provide you with the care you need, when you need it.  We recommend signing up for the patient portal called "MyChart".  Sign up information is provided on this After Visit Summary.  MyChart is used to connect with patients for Virtual Visits (Telemedicine).  Patients are able to view lab/test results, encounter notes, upcoming appointments, etc.  Non-urgent messages can be sent to your provider as well.   To learn more about what you can do with MyChart, go to [email or web address].    Your next appointment:    12 months with Dr. Hilty  

## 2023-03-01 NOTE — Progress Notes (Unsigned)
OFFICE NOTE  Chief Complaint:  Follow-up  Primary Care Physician: Mechele Claude, MD  HPI:  Carmen [name] is a 82 year-old female comes in for followup of coronary disease. She had bypass surgery in 2007 and at that time had a 40% ejection fraction. She had a nuclear study in May of 2012 that was a low-risk study, but we could not calculate an ejection fraction because of ectopy. She has had no episodes of angina, no unusual shortness of breath unless she walks up a hill or a flight of steps, and then she becomes mildly breathless. She does not have PND or orthopnea. She does not have a productive cough. She has had no awareness of any arrhythmias, no dizziness, no syncope. She does have mild left ankle edema that is dependent in nature. She wears support hose infrequently for this. She has recurrent problems with gout, usually in her great toe. She has made some significant dietary changes, and the gout episodes seem to be less.  She has been diabetic for around 2 years. She has a hemoglobin A1c of 5.8, has had no significant episodes of hypoglycemia. She has noted some proximal lower extremity weakness. She gave an example working in her garden, when she tried to get up her legs were so weak that she had to get up on all fours, and one time her husband had to get her up. She has gone through physical therapy recently because of right hip bursitis, and it may very well be that she needs additional physical therapy for proximal muscle weakness and core strength building. No significant problems from the pollen. She has had some problems with constipation. Otherwise, no new complaints today.  Of note she's had a markedly abnormal lipid profile with particle numbers greater than 3000. She has been intolerant to all statins and has failed Crestor, Zetia, Zocor, Lipitor and Livalo in the past. I feel that she remains at very high risk of cardiac events and there is a great likelihood that she may  develop premature graft failure with her high cholesterol numbers. We did discuss the new PCSK9 inhibitors, for which she may be a great candidate if she is willing to give herself an injection every 2-4 weeks..  I saw [name] back in the office today. She is doing really well and denies any chest pain or worsening shortness of breath. She was referred for a trial of a study drug for PCS K9 inhibition. Unfortunately this was never arranged however subsequently to [facility] SK 9 inhibitors came out to the market. She was subsequently started on Praluent by her primary care provider and is been using that for several months. Hopefully this will get her cholesterol down to goal.  11/12/2015  Carmen [name] returns today for follow-up. Over the past year she's done fairly well. She occasionally gets some swelling in her legs. She denies any chest pain or shortness of breath. We had started her on Praluent at her last office visit due to statin intolerance. I again discussed the importance of using at least low-dose statin with the Praluent foremost benefit, however she has had a significant reduction in her cholesterol with LDL that was greater than 782 now down to 82. Although she has not had any coronary event since her bypass, it is now been 10 years since her bypass surgery. She last had a stress test in 2012 which was negative for ischemia.  10/29/2016  Carmen [name] was seen today in follow-up. Unfortunately she  lost her husband at the beginning of this year to an acute hemorrhagic stroke. This is her second husband but they've been married for 10 years and she said that he was the love of her life. Overall she is doing well from a coronary standpoint. She denies any chest pain or worsening shortness of breath. Show stress test in November of last year which was negative for ischemia. She's had marked improvement in her cholesterol on Praluent, but her triglycerides remain elevated. Most of this is likely  related to elevated blood sugars but that is also being worked on. She is currently on Januvia.  11/09/2017  Carmen [name] returns for follow-up.  Recently she saw [facility] care wife for preoperative risk assessment for GYN surgery.  She had a uterine fibroid which was removed uneventfully.  She has marked dyslipidemia on Praluent and has been intolerant to statins.  Recent lipid profile showed total cholesterol 188, triglycerides 358 (on Lovaza), HDL 42 and LDL 74.  Overall reasonable lipid profile except for elevated triglycerides which percent residual risk.  We discussed the possibility of switching her over to [facility] when it obtains FDA approval in October.  Blood pressure is well controlled today.  She denies any chest pain or worsening shortness of breath.  Unfortunately her blood sugars been very poorly controlled, probably contributing to her elevated triglycerides.  04/27/2018  [name] is seen today in follow-up.  She has successfully been using Praluent with some improvement in her lipid profile however most recently her total cholesterol is 187, triglycerides 466, HDL 37 and a direct LDL was not measured.  Presumably her LDL has come down further.  She continues to have persistently elevated triglycerides however which as previously mentioned may lead to persistently elevated cardiovascular risk.  Her only other complaint today is leg swelling.  She noted that she has had some persistently elevated edema despite being on triamterene HCTZ.  She is wondering if there is something stronger for her.  06/10/2020  Carmen [name] is seen today in follow-up.  She continues to do well with Praluent and Vascepa.  She has not had her lipids done since last summer however her LDL was low at 56.  Triglycerides remained elevated.  Recently she has been established with endocrine.  Her A1c is come down from 7.3-6.4.  She denies chest pain or shortness of breath.  She pointed out small mass about the size of a  walnut over the left lower rib to me today.  This did feel somewhat firm and most likely is a lipoma.  She says she has a history of that.  I have encouraged her to follow-up with her primary care provider regarding that.  03/01/2023  Ms. Sicking is seen today in follow-up.  She seems to be doing well.  She had recent repeat lipids showing total cholesterol 147, triglycerides 199, HDL 44 and LDL 70.  Hemoglobin A1c 6.1%.  She is primarily been undergoing therapy and follow-up with Dr. Myna Hidalgo at the cancer center.  She is on low-dose aspirin.  She continues on Vascepa and Praluent 75 mg every 2 weeks.  PMHx:  Past Medical History:  Diagnosis Date   Arthritis    Asymptomatic PVCs    Bilateral shoulder pain    CAD (coronary artery disease)    Cancer (HCC) 11/2015   [id number]  right upper arm   Chronic renal insufficiency, stage 3 (moderate) (HCC)    Diabetes mellitus without complication (HCC)    Diverticulosis  Endometrial polyp    External hemorrhoids    Goals of care, counseling/discussion 02/07/2021   Gout    Hemochromatosis, hereditary (HCC) 11/24/2012   History of anemia    History of duodenal ulcer 1990   History of radiation therapy    Left breast- 08/06/21-09/04/21- Dr. Antony Blackbird   Hyperlipidemia    Hypertension    Internal hemorrhoids    Jaundice    age 72 or 55   Myocardial infarction (HCC) 2007   Neuromuscular disorder (HCC)    peripheral neuropathy   PMB (postmenopausal bleeding)    PONV (postoperative nausea and vomiting)    Prolapse of female pelvic organs    uses pessary   Seizures (HCC)    had one at Dr. Gustavo Lah office after getting blood drawn   Stage I breast cancer, left (HCC) 12/16/2020   Tick bite 08/12/2017   had 3 bites   Type 2 diabetes mellitus with hyperglycemia, without long-term current use of insulin (HCC) 01/27/2019   Vitamin D deficiency     Past Surgical History:  Procedure Laterality Date   BREAST LUMPECTOMY WITH RADIOACTIVE  SEED AND SENTINEL LYMPH NODE BIOPSY Left 01/14/2021   Procedure: LEFT BREAST LUMPECTOMY WITH RADIOACTIVE SEED AND SENTINEL LYMPH NODE BIOPSY;  Surgeon: Griselda Miner, MD;  Location: [facility];  Service: General;  Laterality: Left;   BREAST SURGERY     left breast lump--benign   CARDIAC CATHETERIZATION  06/03/2005   COLONOSCOPY  11/28/2001   CORONARY ARTERY BYPASS GRAFT  2007   x2 Dr. Dorris Fetch, LIMA to LAD, SVG to PDA   DILATATION & CURETTAGE/HYSTEROSCOPY WITH MYOSURE N/A 09/28/2017   Procedure: DILATATION & CURETTAGE/HYSTEROSCOPY WITH MYOSURE;  Surgeon: Richarda Overlie, MD;  Location: [facility];  Service: Gynecology;  Laterality: N/A;   LIPOMA EXCISION     back   LIPOMA EXCISION Left 01/14/2021   Procedure: EXCISION LEFT CHEST WALL LIPOMA;  Surgeon: Griselda Miner, MD;  Location: [facility];  Service: General;  Laterality: Left;   PORTACATH PLACEMENT Right 01/14/2021   Procedure: INSERTION PORT-A-CATH;  Surgeon: Griselda Miner, MD;  Location: [facility];  Service: General;  Laterality: Right;   UPPER GI ENDOSCOPY  01/20/1989    FAMHx:  Family History  Problem Relation Age of Onset   Stroke Mother    Hypertension Mother    Neuropathy Mother    Stroke Father    Hypertension Father    Diabetes Father    Cancer Sister 54       sarcoma   Arthritis Sister    Arthritis Sister    Lupus Sister    Hemachromatosis Sister    Arthritis Sister    Hemachromatosis Sister    Hemachromatosis Sister    Diverticulitis Sister    Diabetes Brother    Diabetes Brother    Diabetes Brother    Hemachromatosis Brother    Lung cancer Brother    Liver cancer Maternal Grandmother 36   Melanoma Daughter 48    SOCHx:   reports that she has never smoked. She has never used smokeless tobacco. She reports current alcohol use. She reports that she does not use drugs.  ALLERGIES:  Allergies  Allergen Reactions   Zofran [Ondansetron Hcl]  Other (See Comments)    Severe headache and abdominal pain   Cymbalta [Duloxetine Hcl] Other (See Comments)    Profuse sweating   Farxiga [Dapagliflozin] Other (See Comments)    Dizziness   Januvia [  Sitagliptin] Swelling   Meloxicam Nausea And Vomiting   Shellfish Allergy Other (See Comments)    Gout   Trulicity [Dulaglutide] Nausea Only   Diclofenac Diarrhea   Lipitor [Atorvastatin] Other (See Comments)    Muscle aches   Livalo [Pitavastatin] Other (See Comments)        Sulfa Antibiotics Rash   Tape Other (See Comments)    Causes skin irritation   Zetia [Ezetimibe] Other (See Comments)    Muscle aches   Zocor [Simvastatin] Other (See Comments)    Muscle aches     ROS: Pertinent items noted in HPI and remainder of comprehensive ROS otherwise negative.  HOME MEDS: Current Outpatient Medications  Medication Sig Dispense Refill   allopurinol (ZYLOPRIM) 100 MG tablet TAKE ONE TABLET TWICE DAILY 180 tablet 0   aspirin 81 MG tablet Take 81 mg by mouth daily.     Cholecalciferol (VITAMIN D3) 250 MCG (10000 UT) TABS Take by mouth 3 (three) times a week.     colchicine 0.6 MG tablet TAKE (1) TABLET DAILY AS NEEDED. 30 tablet 1   diclofenac Sodium (VOLTAREN) 1 % GEL APPLY 4 GRAMS TO AFFECTED AREA(S) 4 TIMES A DAY AS NEEDED 350 g 5   furosemide (LASIX) 20 MG tablet Take 1 tablet (20 mg total) by mouth daily. 90 tablet 3   glipiZIDE (GLUCOTROL) 5 MG tablet Take 1 tablet (5 mg total) by mouth 2 (two) times daily before a meal. 180 tablet 3   glucose blood (ONE TOUCH ULTRA TEST) test strip 1 each by Other route daily in the afternoon. CHECK BLOOD SUGAR 2 TIMES A DAY 100 each 3   icosapent Ethyl (VASCEPA) 1 g capsule TAKE (2) CAPSULES TWICE DAILY. 360 capsule 3   Lancets (ONETOUCH ULTRASOFT) lancets 1 each by Other route daily in the afternoon. Use as instructed 100 each 3   Lancets Misc. (ACCU-CHEK FASTCLIX LANCET) KIT Use to check Blood Sugars 1 kit 0   lidocaine-prilocaine (EMLA) cream  Apply 1 Application topically as needed. 30 g 0   meclizine (ANTIVERT) 12.5 MG tablet Take 1 tablet (12.5 mg total) by mouth 3 (three) times daily as needed for dizziness. 30 tablet 5   metoprolol tartrate (LOPRESSOR) 25 MG tablet TAKE 1 TABLET 2 TIMES A DAY 180 tablet 1   mometasone (ELOCON) 0.1 % cream Apply topically daily. 15 g 1   olmesartan (BENICAR) 20 MG tablet Take 1 tablet (20 mg total) by mouth daily. 90 tablet 3   PRALUENT 150 MG/ML SOAJ Inject 1 Dose into the skin every 14 (fourteen) days. 2 mL 11   pregabalin (LYRICA) 75 MG capsule Take 1 capsule (75 mg total) by mouth 3 (three) times daily. 270 capsule 1   pyridoxine (B-6) 500 MG tablet Take 1 tablet (500 mg total) by mouth daily. 30 tablet 3   Semaglutide (RYBELSUS) 14 MG TABS Take 1 tablet (14 mg total) by mouth daily. 90 tablet 3   UNABLE TO FIND Specialty bra s/p breast cancer surgery 1 Units 0   Vitamin D, Ergocalciferol, (DRISDOL) 1.25 MG (50000 UNIT) CAPS capsule Take 1 capsule (50,000 Units total) by mouth every 7 (seven) days. 13 capsule 3   ciprofloxacin (CIPRO) 500 MG tablet Take 1 tablet (500 mg total) by mouth 2 (two) times daily. (Patient not taking: Reported on 03/01/2023) 14 tablet 0   neomycin-polymyxin-hydrocortisone (CORTISPORIN) OTIC solution Place 4 drops into both ears 4 (four) times daily. (Patient not taking: Reported on 03/01/2023) 10 mL 0  No current facility-administered medications for this visit.    LABS/IMAGING: No results found. However, due to the size of the patient record, not all encounters were searched. Please check Results Review for a complete set of results. No results found.  VITALS: BP 135/79   Pulse 68   Ht 5\' 3"  (1.6 m)   Wt 184 lb (83.5 kg)   SpO2 96%   BMI 32.59 kg/m   EXAM: General appearance: alert, no distress and mildly obese Neck: no carotid bruit and no JVD Lungs: clear to auscultation bilaterally Heart: regular rate and rhythm, S1, S2 normal, no murmur, click, rub or  gallop Abdomen: soft, non-tender; bowel sounds normal; no masses,  no organomegaly Extremities: extremities normal, atraumatic, no cyanosis or edema, venous stasis dermatitis noted and Walnut sized rubbery mass over the left lower rib border in the midline Pulses: 2+ and symmetric Skin: Skin color, texture, turgor normal. No rashes or lesions Neurologic: Grossly normal Psych: Pleasant  EKG: EKG Interpretation Date/Time:  Monday March 01 2023 14:02:34 EST Ventricular Rate:  68 PR Interval:  158 QRS Duration:  88 QT Interval:  366 QTC Calculation: 389 R Axis:   -72  Text Interpretation: Normal sinus rhythm with sinus arrhythmia Left axis deviation No significant change since last tracing Confirmed by Zoila Shutter [phone]) on 03/01/2023 2:24:52 PM    ASSESSMENT: Coronary artery bypass grafting x2 vessels with LIMA to LAD and SVG to PDA in 2007 - negative myoview in 01/2016 Asymptomatic PVCs Hypertension-controlled Dyslipidemia - intolerant to all statins and zetia, markedly improved on Praluent Diabetes Gout  Possible lipoma Breast cancer  PLAN: 1.   Ms. Stumbaugh is doing well from a cardiac standpoint.  She continues to have well-controlled dyslipidemia combination therapy with Praluent and Vascepa.  I would continue with that at this time.  No changes to her medications at this time.  Plan follow-up with me annually or sooner as necessary.  Chrystie Nose, MD, [facility], FACP  [place]  [facility]  Medical Director of the [facility]  [facility] Diplomate of the American Board of Clinical Lipidology Attending Cardiologist  Direct Dial: [phone]  Fax: [phone]  Website:  [email or web address] Nicely Chi Garlow 03/01/2023, 2:24 PM

## 2023-03-02 DIAGNOSIS — L84 Corns and callosities: Secondary | ICD-10-CM | POA: Diagnosis not present

## 2023-03-02 DIAGNOSIS — B351 Tinea unguium: Secondary | ICD-10-CM | POA: Diagnosis not present

## 2023-03-02 DIAGNOSIS — E1142 Type 2 diabetes mellitus with diabetic polyneuropathy: Secondary | ICD-10-CM | POA: Diagnosis not present

## 2023-03-02 DIAGNOSIS — M79676 Pain in unspecified toe(s): Secondary | ICD-10-CM | POA: Diagnosis not present

## 2023-03-13 ENCOUNTER — Other Ambulatory Visit: Payer: Self-pay | Admitting: Family Medicine

## 2023-03-31 ENCOUNTER — Other Ambulatory Visit: Payer: Self-pay | Admitting: Family Medicine

## 2023-03-31 DIAGNOSIS — E1159 Type 2 diabetes mellitus with other circulatory complications: Secondary | ICD-10-CM

## 2023-03-31 DIAGNOSIS — E782 Mixed hyperlipidemia: Secondary | ICD-10-CM

## 2023-03-31 DIAGNOSIS — Z951 Presence of aortocoronary bypass graft: Secondary | ICD-10-CM

## 2023-04-06 ENCOUNTER — Inpatient Hospital Stay: Payer: Medicare Other

## 2023-04-16 ENCOUNTER — Inpatient Hospital Stay: Payer: Medicare Other | Attending: Hematology & Oncology

## 2023-04-16 ENCOUNTER — Encounter: Payer: Self-pay | Admitting: Hematology & Oncology

## 2023-04-16 ENCOUNTER — Inpatient Hospital Stay (HOSPITAL_BASED_OUTPATIENT_CLINIC_OR_DEPARTMENT_OTHER): Payer: Medicare Other | Admitting: Hematology & Oncology

## 2023-04-16 ENCOUNTER — Inpatient Hospital Stay: Payer: Medicare Other

## 2023-04-16 VITALS — BP 133/54 | HR 66 | Temp 98.1°F | Resp 20 | Ht 63.0 in | Wt 183.0 lb

## 2023-04-16 DIAGNOSIS — C50912 Malignant neoplasm of unspecified site of left female breast: Secondary | ICD-10-CM

## 2023-04-16 DIAGNOSIS — Z171 Estrogen receptor negative status [ER-]: Secondary | ICD-10-CM

## 2023-04-16 DIAGNOSIS — E1140 Type 2 diabetes mellitus with diabetic neuropathy, unspecified: Secondary | ICD-10-CM | POA: Insufficient documentation

## 2023-04-16 DIAGNOSIS — Z9221 Personal history of antineoplastic chemotherapy: Secondary | ICD-10-CM | POA: Insufficient documentation

## 2023-04-16 DIAGNOSIS — Z853 Personal history of malignant neoplasm of breast: Secondary | ICD-10-CM | POA: Insufficient documentation

## 2023-04-16 LAB — IRON AND IRON BINDING CAPACITY (CC-WL,HP ONLY)
Iron: 90 ug/dL (ref 28–170)
Saturation Ratios: 27 % (ref 10.4–31.8)
TIBC: 329 ug/dL (ref 250–450)
UIBC: 239 ug/dL (ref 148–442)

## 2023-04-16 LAB — CMP (CANCER CENTER ONLY)
ALT: 19 U/L (ref 0–44)
AST: 23 U/L (ref 15–41)
Albumin: 4.3 g/dL (ref 3.5–5.0)
Alkaline Phosphatase: 89 U/L (ref 38–126)
Anion gap: 9 (ref 5–15)
BUN: 32 mg/dL — ABNORMAL HIGH (ref 8–23)
CO2: 28 mmol/L (ref 22–32)
Calcium: 9.5 mg/dL (ref 8.9–10.3)
Chloride: 102 mmol/L (ref 98–111)
Creatinine: 1.45 mg/dL — ABNORMAL HIGH (ref 0.44–1.00)
GFR, Estimated: 36 mL/min — ABNORMAL LOW
Glucose, Bld: 138 mg/dL — ABNORMAL HIGH (ref 70–99)
Potassium: 4.1 mmol/L (ref 3.5–5.1)
Sodium: 139 mmol/L (ref 135–145)
Total Bilirubin: 0.4 mg/dL (ref 0.0–1.2)
Total Protein: 6.6 g/dL (ref 6.5–8.1)

## 2023-04-16 LAB — CBC WITH DIFFERENTIAL (CANCER CENTER ONLY)
Abs Immature Granulocytes: 0.05 K/uL (ref 0.00–0.07)
Basophils Absolute: 0 K/uL (ref 0.0–0.1)
Basophils Relative: 0 %
Eosinophils Absolute: 0.2 K/uL (ref 0.0–0.5)
Eosinophils Relative: 3 %
HCT: 38.8 % (ref 36.0–46.0)
Hemoglobin: 13.4 g/dL (ref 12.0–15.0)
Immature Granulocytes: 1 %
Lymphocytes Relative: 24 %
Lymphs Abs: 1.9 K/uL (ref 0.7–4.0)
MCH: 31.8 pg (ref 26.0–34.0)
MCHC: 34.5 g/dL (ref 30.0–36.0)
MCV: 92.2 fL (ref 80.0–100.0)
Monocytes Absolute: 0.6 K/uL (ref 0.1–1.0)
Monocytes Relative: 7 %
Neutro Abs: 5.1 K/uL (ref 1.7–7.7)
Neutrophils Relative %: 65 %
Platelet Count: 120 K/uL — ABNORMAL LOW (ref 150–400)
RBC: 4.21 MIL/uL (ref 3.87–5.11)
RDW: 13.8 % (ref 11.5–15.5)
WBC Count: 7.9 K/uL (ref 4.0–10.5)
nRBC: 0 % (ref 0.0–0.2)

## 2023-04-16 LAB — FERRITIN: Ferritin: 56 ng/mL (ref 11–307)

## 2023-04-16 LAB — LACTATE DEHYDROGENASE: LDH: 194 U/L — ABNORMAL HIGH (ref 98–192)

## 2023-04-16 MED ORDER — SODIUM CHLORIDE 0.9% FLUSH
10.0000 mL | INTRAVENOUS | Status: DC | PRN
  Administered 2023-04-16: 10 mL via INTRAVENOUS

## 2023-04-16 MED ORDER — HEPARIN SOD (PORK) LOCK FLUSH 100 UNIT/ML IV SOLN
500.0000 [IU] | Freq: Once | INTRAVENOUS | Status: AC
  Administered 2023-04-16: 500 [IU] via INTRAVENOUS

## 2023-04-16 NOTE — Patient Instructions (Signed)
Implanted [facility] Guide An implanted port is a device that is placed under the skin. It is usually placed in the chest. The device may vary based on the need. Implanted ports can be used to give IV medicine, to take blood, or to give fluids. You may have an implanted port if: You need IV medicine that would be irritating to the small veins in your hands or arms. You need IV medicines, such as chemotherapy, for a long period of time. You need IV nutrition for a long period of time. You may have fewer limitations when using a port than you would if you used other types of long-term IVs. You will also likely be able to return to normal activities after your incision heals. An implanted port has two main parts: Reservoir. The reservoir is the part where a needle is inserted to give medicines or draw blood. The reservoir is round. After the port is placed, it appears as a small, raised area under your skin. Catheter. The catheter is a small, thin tube that connects the reservoir to a vein. Medicine that is inserted into the reservoir goes into the catheter and then into the vein. How is my port accessed? To access your port: A numbing cream may be placed on the skin over the port site. Your health care provider will put on a mask and sterile gloves. The skin over your port will be cleaned carefully with a germ-killing soap and allowed to dry. Your health care provider will gently pinch the port and insert a needle into it. Your health care provider will check for a blood return to make sure the port is in the vein and is still working (patent). If your port needs to remain accessed to get medicine continuously (constant infusion), your health care provider will place a clear bandage (dressing) over the needle site. The dressing and needle will need to be changed every week, or as told by your health care provider. What is flushing? Flushing helps keep the port working. Follow instructions from your  health care provider about how and when to flush the port. Ports are usually flushed with saline solution or a medicine called heparin. The need for flushing will depend on how the port is used: If the port is only used from time to time to give medicines or draw blood, the port may need to be flushed: Before and after medicines have been given. Before and after blood has been drawn. As part of routine maintenance. Flushing may be recommended every 4-6 weeks. If a constant infusion is running, the port may not need to be flushed. Throw away any syringes in a disposal container that is meant for sharp items (sharps container). You can buy a sharps container from a pharmacy, or you can make one by using an empty hard plastic bottle with a cover. How long will my port stay implanted? The port can stay in for as long as your health care provider thinks it is needed. When it is time for the port to come out, a surgery will be done to remove it. The surgery will be similar to the procedure that was done to put the port in. Follow these instructions at home: Caring for your port and port site Flush your port as told by your health care provider. If you need an infusion over several days, follow instructions from your health care provider about how to take care of your port site. Make sure you: Change your  dressing as told by your health care provider. Wash your hands with soap and water for at least 20 seconds before and after you change your dressing. If soap and water are not available, use alcohol-based hand sanitizer. Place any used dressings or infusion bags into a plastic bag. Throw that bag in the trash. Keep the dressing that covers the needle clean and dry. Do not get it wet. Do not use scissors or sharp objects near the infusion tubing. Keep any external tubes clamped, unless they are being used. Check your port site every day for signs of infection. Check for: Redness, swelling, or  pain. Fluid or blood. Warmth. Pus or a bad smell. Protect the skin around the port site. Avoid wearing bra straps that rub or irritate the site. Protect the skin around your port from seat belts. Place a soft pad over your chest if needed. Bathe or shower as told by your health care provider. The site may get wet as long as you are not actively receiving an infusion. General instructions  Return to your normal activities as told by your health care provider. Ask your health care provider what activities are safe for you. Carry a medical alert card or wear a medical alert bracelet at all times. This will let health care providers know that you have an implanted port in case of an emergency. Where to find more information American Cancer Society: www.cancer.org American Society of Clinical Oncology: www.cancer.net Contact a health care provider if: You have a fever or chills. You have redness, swelling, or pain at the port site. You have fluid or blood coming from your port site. Your incision feels warm to the touch. You have pus or a bad smell coming from the port site. Summary Implanted ports are usually placed in the chest for long-term IV access. Follow instructions from your health care provider about flushing the port and changing bandages (dressings). Take care of the area around your port by avoiding clothing that puts pressure on the area, and by watching for signs of infection. Protect the skin around your port from seat belts. Place a soft pad over your chest if needed. Contact a health care provider if you have a fever or you have redness, swelling, pain, fluid, or a bad smell at the port site. This information is not intended to replace advice given to you by your health care provider. Make sure you discuss any questions you have with your health care provider. Document Revised: 09/10/2020 Document Reviewed: 09/10/2020 Elsevier Patient Education  2024 [employer].

## 2023-04-16 NOTE — Progress Notes (Signed)
Hematology and Oncology Follow Up Visit  Carmen [name] [id number] 23-Dec-1940 83 y.o. 04/16/2023   Principle Diagnosis:  Invasive ductal carcinoma of the left breast-stage IIA (T2N0M0) -- TRIPLE NEGATIVE -- s/p LEFT lobectomy on 01/14/2021 Hemochromatosis (double heterozygote for C282Y and S65C mutations).   Current Therapy:  Phlebotomy to maintain ferritin less than 100 Adriamycin/Cytoxan - started adjuvant therapy on 02/27/2021, s/p cycle #6/6 -completed on 06/30/2021 XRT to the LEFT breast -- started on 08/06/2021 -- completed 09/23/2021   Interim History:  Carmen [name] is here today for follow-up.  Overall, I think she is doing quite well.  She was last seen back in  September..  She has both the breast cancer and the hemochromatosis.  As far as the hemochromatosis is concerned, back in September January, her ferritin was 86 with an iron saturation of 34%.    Thankfully, her blood sugars are doing okay right now.  This is can be her biggest problem in the long-term.  Over the holidays, she says she had pain in her right rib cage.  She thought maybe she had done a little too much or eaten too much food.  She has some little angiomas on her abdominal wall.  Told her this is nothing to worry about and nothing related to her hemochromatosis or her breast cancer.  She has had no change in bowel or bladder habits.  She has had no leg swelling.  She has had little bit of neuropathy from the diabetes.  Overall, I would say performance status is probably ECOG 1.   Medications:  Allergies as of 04/16/2023       Reactions   Zofran [ondansetron Hcl] Other (See Comments)   Severe headache and abdominal pain   Cymbalta [duloxetine Hcl] Other (See Comments)   Profuse sweating   Farxiga [dapagliflozin] Other (See Comments)   Dizziness   Januvia [sitagliptin] Swelling   Meloxicam Nausea And Vomiting   Shellfish Allergy Other (See Comments)   Gout   Trulicity [dulaglutide] Nausea  Only   Diclofenac Diarrhea   Lipitor [atorvastatin] Other (See Comments)   Muscle aches   Livalo [pitavastatin] Other (See Comments)      Sulfa Antibiotics Rash   Tape Other (See Comments)   Causes skin irritation   Zetia [ezetimibe] Other (See Comments)   Muscle aches   Zocor [simvastatin] Other (See Comments)   Muscle aches         Medication List        Accurate as of April 16, 2023  2:00 PM. If you have any questions, ask your nurse or doctor.          STOP taking these medications    ciprofloxacin 500 MG tablet Commonly known as: Cipro Stopped by: Josph Macho   neomycin-polymyxin-hydrocortisone OTIC solution Commonly known as: CORTISPORIN Stopped by: Josph Macho       TAKE these medications    Accu-Chek FastClix Lancet Kit Use to check Blood Sugars   allopurinol 100 MG tablet Commonly known as: ZYLOPRIM TAKE ONE TABLET TWICE DAILY   aspirin 81 MG tablet Take 81 mg by mouth daily.   colchicine 0.6 MG tablet TAKE (1) TABLET DAILY AS NEEDED.   diclofenac Sodium 1 % Gel Commonly known as: VOLTAREN APPLY 4 GRAMS TO AFFECTED AREA(S) 4 TIMES A DAY AS NEEDED   furosemide 20 MG tablet Commonly known as: LASIX TAKE ONE TABLET EVERY DAY   glipiZIDE 5 MG tablet Commonly known as: GLUCOTROL Take 1 tablet (  5 mg total) by mouth 2 (two) times daily before a meal.   glucose blood test strip Commonly known as: ONE TOUCH ULTRA TEST 1 each by Other route daily in the afternoon. CHECK BLOOD SUGAR 2 TIMES A DAY   lidocaine-prilocaine cream Commonly known as: EMLA Apply 1 Application topically as needed.   meclizine 12.5 MG tablet Commonly known as: ANTIVERT Take 1 tablet (12.5 mg total) by mouth 3 (three) times daily as needed for dizziness.   metoprolol tartrate 25 MG tablet Commonly known as: LOPRESSOR TAKE 1 TABLET 2 TIMES A DAY   mometasone 0.1 % cream Commonly known as: ELOCON Apply topically daily.   olmesartan 20 MG  tablet Commonly known as: BENICAR TAKE ONE TABLET EVERY DAY   onetouch ultrasoft lancets 1 each by Other route daily in the afternoon. Use as instructed   Praluent 150 MG/ML Soaj Generic drug: Alirocumab Inject 1 Dose into the skin every 14 (fourteen) days.   pregabalin 75 MG capsule Commonly known as: LYRICA Take 1 capsule (75 mg total) by mouth 3 (three) times daily.   pyridoxine 500 MG tablet Commonly known as: B-6 Take 1 tablet (500 mg total) by mouth daily.   Rybelsus 14 MG Tabs Generic drug: Semaglutide Take 1 tablet (14 mg total) by mouth daily.   UNABLE TO FIND Specialty bra s/p breast cancer surgery   Vascepa 1 g capsule Generic drug: icosapent Ethyl TAKE TWO CAPSULES TWICE DAILY   Vitamin D (Ergocalciferol) 1.25 MG (50000 UNIT) Caps capsule Commonly known as: DRISDOL Take 1 capsule (50,000 Units total) by mouth every 7 (seven) days.   Vitamin D3 250 MCG (10000 UT) Tabs Take by mouth 3 (three) times a week.        Allergies:  Allergies  Allergen Reactions   Zofran [Ondansetron Hcl] Other (See Comments)    Severe headache and abdominal pain   Cymbalta [Duloxetine Hcl] Other (See Comments)    Profuse sweating   Farxiga [Dapagliflozin] Other (See Comments)    Dizziness   Januvia [Sitagliptin] Swelling   Meloxicam Nausea And Vomiting   Shellfish Allergy Other (See Comments)    Gout   Trulicity [Dulaglutide] Nausea Only   Diclofenac Diarrhea   Lipitor [Atorvastatin] Other (See Comments)    Muscle aches   Livalo [Pitavastatin] Other (See Comments)        Sulfa Antibiotics Rash   Tape Other (See Comments)    Causes skin irritation   Zetia [Ezetimibe] Other (See Comments)    Muscle aches   Zocor [Simvastatin] Other (See Comments)    Muscle aches     Past Medical History, Surgical history, Social history, and Family History were reviewed and updated.  Review of Systems: Review of Systems  Constitutional: Negative.   HENT:  Positive for sore  throat.   Eyes: Negative.   Respiratory: Negative.    Cardiovascular: Negative.   Gastrointestinal: Negative.   Genitourinary: Negative.   Musculoskeletal: Negative.   Skin: Negative.   Neurological: Negative.   Endo/Heme/Allergies: Negative.   Psychiatric/Behavioral: Negative.       Physical Exam:  height is 5\' 3"  (1.6 m) and weight is 183 lb (83 kg). Her oral temperature is 98.1 F (36.7 C). Her blood pressure is 133/54 (abnormal) and her pulse is 66. Her respiration is 20 and oxygen saturation is 96%.   Wt Readings from Last 3 Encounters:  04/16/23 183 lb (83 kg)  03/01/23 184 lb (83.5 kg)  02/24/23 181 lb 9.6 oz (82.4 kg)  Physical Exam Vitals reviewed.  HENT:     Head: Normocephalic and atraumatic.  Eyes:     Pupils: Pupils are equal, round, and reactive to light.  Cardiovascular:     Rate and Rhythm: Normal rate and regular rhythm.     Heart sounds: Normal heart sounds.  Pulmonary:     Effort: Pulmonary effort is normal.     Breath sounds: Normal breath sounds.  Abdominal:     General: Bowel sounds are normal.     Palpations: Abdomen is soft.  Musculoskeletal:        General: No tenderness or deformity. Normal range of motion.     Cervical back: Normal range of motion.  Lymphadenopathy:     Cervical: No cervical adenopathy.  Skin:    General: Skin is warm and dry.     Findings: No erythema or rash.  Neurological:     Mental Status: She is alert and oriented to person, place, and time.  Psychiatric:        Behavior: Behavior normal.        Thought Content: Thought content normal.        Judgment: Judgment normal.      Lab Results  Component Value Date   WBC 7.9 04/16/2023   HGB 13.4 04/16/2023   HCT 38.8 04/16/2023   MCV 92.2 04/16/2023   PLT 120 (L) 04/16/2023   Lab Results  Component Value Date   FERRITIN 77 12/14/2022   IRON 110 12/14/2022   TIBC 322 12/14/2022   UIBC 212 12/14/2022   IRONPCTSAT 34 (H) 12/14/2022   Lab Results   Component Value Date   RETICCTPCT 1.6 07/31/2022   RBC 4.21 04/16/2023   No results found for: "KPAFRELGTCHN", "LAMBDASER", "KAPLAMBRATIO" No results found for: "IGGSERUM", "IGA", "IGMSERUM" No results found for: "TOTALPROTELP", "ALBUMINELP", "A1GS", "A2GS", "BETS", "BETA2SER", "GAMS", "MSPIKE", "SPEI"   Chemistry      Component Value Date/Time   NA 143 02/24/2023 1517   NA 147 (H) 01/22/2017 1451   NA 140 12/31/2015 1052   K 4.2 02/24/2023 1517   K 4.9 (H) 01/22/2017 1451   K 4.2 12/31/2015 1052   CL 101 02/24/2023 1517   CL 106 01/22/2017 1451   CO2 26 02/24/2023 1517   CO2 29 01/22/2017 1451   CO2 23 12/31/2015 1052   BUN 21 02/24/2023 1517   BUN 23 (H) 01/22/2017 1451   BUN 21.4 12/31/2015 1052   CREATININE 1.24 (H) 02/24/2023 1517   CREATININE 1.30 (H) 12/14/2022 1335   CREATININE 1.6 (H) 01/22/2017 1451   CREATININE 1.4 (H) 12/31/2015 1052      Component Value Date/Time   CALCIUM 10.0 02/24/2023 1517   CALCIUM 10.3 01/22/2017 1451   CALCIUM 9.7 12/31/2015 1052   ALKPHOS 119 02/24/2023 1517   ALKPHOS 65 01/22/2017 1451   ALKPHOS 87 12/31/2015 1052   AST 31 02/24/2023 1517   AST 18 12/14/2022 1335   AST 31 12/31/2015 1052   ALT 23 02/24/2023 1517   ALT 15 12/14/2022 1335   ALT 25 01/22/2017 1451   ALT 36 12/31/2015 1052   BILITOT 0.2 02/24/2023 1517   BILITOT 0.5 12/14/2022 1335   BILITOT 0.42 12/31/2015 1052       Impression and Plan: Ms. Hazzard is a very pleasant 83 yo caucasian female with stage IIA infiltrating ductal carcinoma of the left breast, triple negative.   She completed her adjuvant chemotherapy with A/C in April 2023.  I think everything looks okay with  her breast cancer.  I do not see anything with the breast cancer that looks suspicious.  Of note, her last mammogram was done back in August.  We will see what her iron studies look like.  Will plan to get her back in another 4 months.  She still has her Port-A-Cath and so we have to  flush this in 2 months.   1/24/20252:00 PM

## 2023-04-23 ENCOUNTER — Ambulatory Visit: Payer: Medicare Other | Admitting: Internal Medicine

## 2023-04-27 ENCOUNTER — Ambulatory Visit: Payer: Medicare Other

## 2023-04-27 VITALS — Ht 63.0 in | Wt 183.0 lb

## 2023-04-27 DIAGNOSIS — Z0000 Encounter for general adult medical examination without abnormal findings: Secondary | ICD-10-CM | POA: Diagnosis not present

## 2023-04-27 NOTE — Progress Notes (Signed)
 Subjective:   Carmen [name] is a 83 y.o. female who presents for Medicare Annual (Subsequent) preventive examination.  Visit Complete: Virtual I connected with  Carmen [name] on 04/27/23 by a audio enabled telemedicine application and verified that I am speaking with the correct person using two identifiers.  Patient Location: Home  Provider Location: Home Office  This patient declined Interactive audio and video telecommunications. Therefore the visit was completed with audio only.  I discussed the limitations of evaluation and management by telemedicine. The patient expressed understanding and agreed to proceed.  Vital Signs: Because this visit was a virtual/telehealth visit, some criteria may be missing or patient reported. Any vitals not documented were not able to be obtained and vitals that have been documented are patient reported.  Cardiac Risk Factors include: advanced age (>23men, >31 women);diabetes mellitus;dyslipidemia     Objective:    Today's Vitals   04/27/23 1434  Weight: 183 lb (83 kg)  Height: 5' 3 (1.6 m)   Body mass index is 32.42 kg/m.     04/16/2023    1:40 PM 12/14/2022    1:50 PM 07/31/2022    3:10 PM 04/15/2022   11:22 AM 12/31/2021    2:17 PM 10/09/2021   12:00 PM 10/06/2021   10:57 AM  Advanced Directives  Does Patient Have a Medical Advance Directive? No No No Yes No No No  Type of Theme Park Manager;Living will     Copy of Healthcare Power of Attorney in Chart?    No - copy requested     Would patient like information on creating a medical advance directive? No - Patient declined  No - Patient declined  No - Patient declined No - Patient declined     Current Medications (verified) Outpatient Encounter Medications as of 04/27/2023  Medication Sig   allopurinol  (ZYLOPRIM ) 100 MG tablet TAKE ONE TABLET TWICE DAILY   aspirin 81 MG tablet Take 81 mg by mouth daily.   Cholecalciferol (VITAMIN D3) 250 MCG  (10000 UT) TABS Take by mouth 3 (three) times a week.   colchicine  0.6 MG tablet TAKE (1) TABLET DAILY AS NEEDED.   diclofenac  Sodium (VOLTAREN ) 1 % GEL APPLY 4 GRAMS TO AFFECTED AREA(S) 4 TIMES A DAY AS NEEDED   furosemide  (LASIX ) 20 MG tablet TAKE ONE TABLET EVERY DAY   glipiZIDE  (GLUCOTROL ) 5 MG tablet Take 1 tablet (5 mg total) by mouth 2 (two) times daily before a meal.   glucose blood (ONE TOUCH ULTRA TEST) test strip 1 each by Other route daily in the afternoon. CHECK BLOOD SUGAR 2 TIMES A DAY   icosapent  Ethyl (VASCEPA ) 1 g capsule TAKE TWO CAPSULES TWICE DAILY   Lancets (ONETOUCH ULTRASOFT) lancets 1 each by Other route daily in the afternoon. Use as instructed   Lancets Misc. (ACCU-CHEK FASTCLIX LANCET) KIT Use to check Blood Sugars   lidocaine -prilocaine  (EMLA ) cream Apply 1 Application topically as needed.   meclizine  (ANTIVERT ) 12.5 MG tablet Take 1 tablet (12.5 mg total) by mouth 3 (three) times daily as needed for dizziness.   metoprolol  tartrate (LOPRESSOR ) 25 MG tablet TAKE 1 TABLET 2 TIMES A DAY   mometasone  (ELOCON ) 0.1 % cream Apply topically daily.   olmesartan  (BENICAR ) 20 MG tablet TAKE ONE TABLET EVERY DAY   PRALUENT  150 MG/ML SOAJ Inject 1 Dose into the skin every 14 (fourteen) days.   pregabalin  (LYRICA ) 75 MG capsule Take 1 capsule (75 mg total) by  mouth 3 (three) times daily.   pyridoxine  (B-6) 500 MG tablet Take 1 tablet (500 mg total) by mouth daily.   Semaglutide  (RYBELSUS ) 14 MG TABS Take 1 tablet (14 mg total) by mouth daily.   UNABLE TO FIND Specialty bra s/p breast cancer surgery   Vitamin D , Ergocalciferol , (DRISDOL ) 1.25 MG (50000 UNIT) CAPS capsule Take 1 capsule (50,000 Units total) by mouth every 7 (seven) days.   [DISCONTINUED] prochlorperazine  (COMPAZINE ) 10 MG tablet Take 1 tablet (10 mg total) by mouth every 6 (six) hours as needed (Nausea or vomiting).   No facility-administered encounter medications on file as of 04/27/2023.    Allergies  (verified) Zofran  [ondansetron  hcl], Cymbalta  [duloxetine  hcl], Farxiga  [dapagliflozin ], Januvia  [sitagliptin ], Meloxicam , Shellfish allergy, Trulicity  [dulaglutide ], Diclofenac , Lipitor [atorvastatin], Livalo [pitavastatin], Sulfa antibiotics, Tape, Zetia [ezetimibe], and Zocor [simvastatin]   History: Past Medical History:  Diagnosis Date   Arthritis    Asymptomatic PVCs    Bilateral shoulder pain    CAD (coronary artery disease)    Cancer (HCC) 11/2015   [id number]  right upper arm   Chronic renal insufficiency, stage 3 (moderate) (HCC)    Diabetes mellitus without complication (HCC)    Diverticulosis    Endometrial polyp    External hemorrhoids    Goals of care, counseling/discussion 02/07/2021   Gout    Hemochromatosis, hereditary (HCC) 11/24/2012   History of anemia    History of duodenal ulcer 1990   History of radiation therapy    Left breast- 08/06/21-09/04/21- Dr. Lynwood Nasuti   Hyperlipidemia    Hypertension    Internal hemorrhoids    Jaundice    age 5 or 84   Myocardial infarction (HCC) 2007   Neuromuscular disorder (HCC)    peripheral neuropathy   PMB (postmenopausal bleeding)    PONV (postoperative nausea and vomiting)    Prolapse of female pelvic organs    uses pessary   Seizures (HCC)    had one at Dr. Jessy office after getting blood drawn   Stage I breast cancer, left (HCC) 12/16/2020   Tick bite 08/12/2017   had 3 bites   Type 2 diabetes mellitus with hyperglycemia, without long-term current use of insulin  (HCC) 01/27/2019   Vitamin D  deficiency    Past Surgical History:  Procedure Laterality Date   BREAST LUMPECTOMY WITH RADIOACTIVE SEED AND SENTINEL LYMPH NODE BIOPSY Left 01/14/2021   Procedure: LEFT BREAST LUMPECTOMY WITH RADIOACTIVE SEED AND SENTINEL LYMPH NODE BIOPSY;  Surgeon: Curvin Deward MOULD, MD;  Location: [facility];  Service: General;  Laterality: Left;   BREAST SURGERY     left breast lump--benign   CARDIAC  CATHETERIZATION  06/03/2005   COLONOSCOPY  11/28/2001   CORONARY ARTERY BYPASS GRAFT  2007   x2 Dr. Kerrin, LIMA to LAD, SVG to PDA   DILATATION & CURETTAGE/HYSTEROSCOPY WITH MYOSURE N/A 09/28/2017   Procedure: DILATATION & CURETTAGE/HYSTEROSCOPY WITH MYOSURE;  Surgeon: Johnnye Ade, MD;  Location: [facility];  Service: Gynecology;  Laterality: N/A;   LIPOMA EXCISION     back   LIPOMA EXCISION Left 01/14/2021   Procedure: EXCISION LEFT CHEST WALL LIPOMA;  Surgeon: Curvin Deward MOULD, MD;  Location: [facility];  Service: General;  Laterality: Left;   PORTACATH PLACEMENT Right 01/14/2021   Procedure: INSERTION PORT-A-CATH;  Surgeon: Curvin Deward MOULD, MD;  Location: [facility];  Service: General;  Laterality: Right;   UPPER GI ENDOSCOPY  01/20/1989   Family History  Problem Relation  Age of Onset   Stroke Mother    Hypertension Mother    Neuropathy Mother    Stroke Father    Hypertension Father    Diabetes Father    Cancer Sister 8       sarcoma   Arthritis Sister    Arthritis Sister    Lupus Sister    Hemachromatosis Sister    Arthritis Sister    Hemachromatosis Sister    Hemachromatosis Sister    Diverticulitis Sister    Diabetes Brother    Diabetes Brother    Diabetes Brother    Hemachromatosis Brother    Lung cancer Brother    Liver cancer Maternal Grandmother 65   Melanoma Daughter 58   Social History   Socioeconomic History   Marital status: Widowed    Spouse name: Not on file   Number of children: 3   Years of education: 51   Highest education level: 12th grade  Occupational History   Occupation: retired  Tobacco Use   Smoking status: Never   Smokeless tobacco: Never   Tobacco comments:    never used tobacco  Vaping Use   Vaping status: Never Used  Substance and Sexual Activity   Alcohol use: Yes    Alcohol/week: 0.0 standard drinks of alcohol    Comment: rare wine   Drug use: No   Sexual activity: Not  Currently    Birth control/protection: Post-menopausal  Other Topics Concern   Not on file  Social History Narrative   Her 2 daughters live nearby and visit often   Social Drivers of Health   Financial Resource Strain: Low Risk  (04/27/2023)   Overall Financial Resource Strain (CARDIA)    Difficulty of Paying Living Expenses: Not hard at all  Food Insecurity: No Food Insecurity (04/27/2023)   Hunger Vital Sign    Worried About Running Out of Food in the Last Year: Never true    Ran Out of Food in the Last Year: Never true  Transportation Needs: No Transportation Needs (04/27/2023)   PRAPARE - Administrator, Civil Service (Medical): No    Lack of Transportation (Non-Medical): No  Physical Activity: Insufficiently Active (04/27/2023)   Exercise Vital Sign    Days of Exercise per Week: 3 days    Minutes of Exercise per Session: 10 min  Stress: No Stress Concern Present (04/27/2023)   [employer] of Occupational Health - Occupational Stress Questionnaire    Feeling of Stress : Not at all  Social Connections: Moderately Integrated (04/27/2023)   Social Connection and Isolation Panel [NHANES]    Frequency of Communication with Friends and Family: More than three times a week    Frequency of Social Gatherings with Friends and Family: Three times a week    Attends Religious Services: More than 4 times per year    Active Member of Clubs or Organizations: Yes    Attends Banker Meetings: More than 4 times per year    Marital Status: Widowed    Tobacco Counseling Counseling given: Not Answered Tobacco comments: never used tobacco   Clinical Intake:  Pre-visit preparation completed: Yes  Pain : No/denies pain     Diabetes: No  How often do you need to have someone help you when you read instructions, pamphlets, or other written materials from your doctor or pharmacy?: 1 - Never  Interpreter Needed?: No  Information entered by :: Charmaine Bloodgood  LPN   Activities of Daily Living  04/27/2023    2:40 PM  In your present state of health, do you have any difficulty performing the following activities:  Hearing? 0  Vision? 0  Difficulty concentrating or making decisions? 0  Walking or climbing stairs? 0  Dressing or bathing? 0  Doing errands, shopping? 0  Preparing Food and eating ? N  Using the Toilet? N  In the past six months, have you accidently leaked urine? N  Do you have problems with loss of bowel control? N  Managing your Medications? N  Managing your Finances? N  Housekeeping or managing your Housekeeping? N    Patient Care Team: Zollie Lowers, MD as PCP - General (Family Medicine) Mona Vinie BROCKS, MD as PCP - Cardiology (Cardiology) Timmy Maude SAUNDERS, MD as Consulting Physician (Oncology) Johnnye Ade, MD as Consulting Physician (Obstetrics and Gynecology) Mona Vinie BROCKS, MD as Consulting Physician (Cardiology) Frances Ozell RAMAN, LCSW as [facility] Care Management (Licensed Clinical Social Worker) Ladora, Ross Lacy Cohn, MD as Referring Physician (Optometry) Shamleffer, Donell Cardinal, MD as Attending Physician (Endocrinology)  Indicate any recent Medical Services you may have received from other than [facility] providers in the past year (date may be approximate).     Assessment:   This is a routine wellness examination for Katrinka.  Hearing/Vision screen Hearing Screening - Comments:: Some hearing problems; has hearing aids  Vision Screening - Comments:: Wears rx glasses - up to date with routine eye exams with Dr. Ladora     Goals Addressed             This Visit's Progress    COMPLETED: AWV       04/12/2020 AWV Goal: Diabetes Management  Patient will maintain an A1C level below 8.0 Patient will not develop any diabetic foot complications Patient will not experience any hypoglycemic episodes over the next 3 months Patient will notify our office of any CBG readings outside of the  provider recommended range by calling [phone] Patient will adhere to provider recommendations for diabetes management  Patient Self Management Activities take all medications as prescribed and report any negative side effects monitor and record blood sugar readings as directed adhere to a low carbohydrate diet that incorporates lean proteins, vegetables, whole grains, low glycemic fruits check feet daily noting any sores, cracks, injuries, or callous formations see PCP or podiatrist if she notices any changes in her legs, feet, or toenails Patient will visit PCP and have an A1C level checked every 3 to 6 months as directed  have a yearly eye exam to monitor for vascular changes associated with diabetes and will request that the report be sent to her pcp.  consult with her PCP regarding any changes in her health or new or worsening symptoms  04/12/2020 AWV Goal: Fall Prevention  Over the next year, patient will decrease their risk for falls by: Using assistive devices, such as a cane or walker, as needed Identifying fall risks within their home and correcting them by: Removing throw rugs Adding handrails to stairs or ramps Removing clutter and keeping a clear pathway throughout the home Increasing light, especially at night Adding shower handles/bars Raising toilet seat Identifying potential personal risk factors for falls: Medication side effects Incontinence/urgency Vestibular dysfunction Hearing loss Musculoskeletal disorders Neurological disorders Orthostatic hypotension        Depression Screen    04/27/2023    2:38 PM 02/24/2023    3:13 PM 08/19/2022    3:53 PM 05/19/2022    3:26 PM 04/15/2022  11:22 AM 02/18/2022   12:55 PM 08/14/2021    2:12 PM  PHQ 2/9 Scores  PHQ - 2 Score 0 0 0 0 0 0 0    Fall Risk    04/27/2023    2:40 PM 02/24/2023    3:13 PM 08/19/2022    3:53 PM 05/19/2022    3:26 PM 04/15/2022   11:20 AM  Fall Risk   Falls in the past year? 0 0 0 0 0   Number falls in past yr: 0    0  Injury with Fall? 0    0  Risk for fall due to : Impaired mobility    No Fall Risks  Follow up Follow up appointment;Falls prevention discussed;Education provided;Falls evaluation completed    Falls prevention discussed    MEDICARE RISK AT HOME: Medicare Risk at Home Any stairs in or around the home?: No If so, are there any without handrails?: No Home free of loose throw rugs in walkways, pet beds, electrical cords, etc?: Yes Adequate lighting in your home to reduce risk of falls?: Yes Life alert?: No Use of a cane, walker or w/c?: Yes Grab bars in the bathroom?: Yes Shower chair or bench in shower?: No Elevated toilet seat or a handicapped toilet?: Yes  TIMED UP AND GO:  Was the test performed?  No    Cognitive Function:    11/08/2017    5:06 PM 08/08/2014    3:16 PM  MMSE - Mini Mental State Exam  Orientation to time 5 5  Orientation to Place 5 5  Registration 3 3  Attention/ Calculation 5 5  Recall 3 3  Language- name 2 objects 2 2  Language- repeat 1 1  Language- follow 3 step command 3 3  Language- read & follow direction 1 1  Write a sentence 1 1  Copy design 1 1  Total score 30 30        04/27/2023    2:40 PM 04/15/2022   11:23 AM 04/12/2020    1:52 PM 11/10/2018    1:41 PM  6CIT Screen  What Year? 0 points 0 points 0 points 0 points  What month? 0 points 0 points 0 points 0 points  What time? 0 points 0 points 0 points 0 points  Count back from 20 0 points 0 points 0 points 0 points  Months in reverse 0 points 0 points 0 points 0 points  Repeat phrase 0 points 0 points 0 points 0 points  Total Score 0 points 0 points 0 points 0 points    Immunizations Immunization History  Administered Date(s) Administered   Fluad Quad(high Dose 65+) 01/20/2021, 02/18/2022   Fluad Trivalent(High Dose 65+) 03/11/2023   Influenza, High Dose Seasonal PF 12/09/2012, 12/26/2013, 03/18/2018   Influenza,inj,Quad PF,6+ Mos 01/01/2015    Influenza,inj,quad, With Preservative 04/23/2017   Influenza-Unspecified 12/21/2013, 01/21/2016, 03/09/2020   PFIZER(Purple Top)SARS-COV-2 Vaccination 04/16/2019, 05/08/2019, 01/20/2020, 10/05/2020   Zoster, Live 01/24/2015    TDAP status: Due, Education has been provided regarding the importance of this vaccine. Advised may receive this vaccine at local pharmacy or Health Dept. Aware to provide a copy of the vaccination record if obtained from local pharmacy or Health Dept. Verbalized acceptance and understanding.  Flu Vaccine status: Up to date  Pneumococcal vaccine status: Declined,  Education has been provided regarding the importance of this vaccine but patient still declined. Advised may receive this vaccine at local pharmacy or Health Dept. Aware to provide a copy of the  vaccination record if obtained from local pharmacy or Health Dept. Verbalized acceptance and understanding.   Covid-19 vaccine status: Declined, Education has been provided regarding the importance of this vaccine but patient still declined. Advised may receive this vaccine at local pharmacy or Health Dept.or vaccine clinic. Aware to provide a copy of the vaccination record if obtained from local pharmacy or Health Dept. Verbalized acceptance and understanding.  Qualifies for Shingles Vaccine? Yes   Zostavax completed No   Shingrix Completed?: No.    Education has been provided regarding the importance of this vaccine. Patient has been advised to call insurance company to determine out of pocket expense if they have not yet received this vaccine. Advised may also receive vaccine at local pharmacy or Health Dept. Verbalized acceptance and understanding.  Screening Tests Health Maintenance  Topic Date Due   DEXA SCAN  11/09/2019   COVID-19 Vaccine (5 - 2024-25 season) 11/22/2022   Zoster Vaccines- Shingrix (1 of 2) 05/25/2023 (Originally 06/06/1959)   DTaP/Tdap/Td (1 - Tdap) 02/24/2024 (Originally 06/06/1959)   Pneumonia  Vaccine 35+ Years old (1 of 2 - PCV) 02/24/2024 (Originally 06/06/1946)   MAMMOGRAM  07/01/2023   OPHTHALMOLOGY EXAM  07/28/2023   HEMOGLOBIN A1C  08/25/2023   FOOT EXAM  02/04/2024   Diabetic kidney evaluation - Urine ACR  02/24/2024   Diabetic kidney evaluation - eGFR measurement  04/15/2024   Medicare Annual Wellness (AWV)  04/26/2024   INFLUENZA VACCINE  Completed   HPV VACCINES  Aged Out    Health Maintenance  Health Maintenance Due  Topic Date Due   DEXA SCAN  11/09/2019   COVID-19 Vaccine (5 - 2024-25 season) 11/22/2022    Colorectal cancer screening: No longer required.   Mammogram status: Completed 12/31/22. Repeat every year  Bone Density status: Completed 11/08/17. Results reflect: Bone density results: OSTEOPENIA. Repeat every 2 years.  Lung Cancer Screening: (Low Dose CT Chest recommended if Age 33-80 years, 20 pack-year currently smoking OR have quit w/in 15years.) does not qualify.   Lung Cancer Screening Referral: n/a   Additional Screening:  Hepatitis C Screening: does not qualify  Vision Screening: Recommended annual ophthalmology exams for early detection of glaucoma and other disorders of the eye. Is the patient up to date with their annual eye exam?  Yes  Who is the provider or what is the name of the office in which the patient attends annual eye exams? Dr. Ladora  If pt is not established with a provider, would they like to be referred to a provider to establish care? No .   Dental Screening: Recommended annual dental exams for proper oral hygiene  Diabetic Foot Exam: Diabetic Foot Exam: Completed 02/04/23  Community Resource Referral / Chronic Care Management: CRR required this visit?  No   CCM required this visit?  No     Plan:     I have personally reviewed and noted the following in the patient's chart:   Medical and social history Use of alcohol, tobacco or illicit drugs  Current medications and supplements including opioid prescriptions.  Patient is not currently taking opioid prescriptions. Functional ability and status Nutritional status Physical activity Advanced directives List of other physicians Hospitalizations, surgeries, and ER visits in previous 12 months Vitals Screenings to include cognitive, depression, and falls Referrals and appointments  In addition, I have reviewed and discussed with patient certain preventive protocols, quality metrics, and best practice recommendations. A written personalized care plan for preventive services as well as general preventive health  recommendations were provided to patient.     Lavelle Pfeiffer [place], [place]   09/24/7972   After Visit Summary: (MyChart) Due to this being a telephonic visit, the after visit summary with patients personalized plan was offered to patient via MyChart   Nurse Notes: No concerns at this time

## 2023-04-27 NOTE — Patient Instructions (Signed)
 Ms. Longmire , Thank you for taking time to come for your Medicare Wellness Visit. I appreciate your ongoing commitment to your health goals. Please review the following plan we discussed and let me know if I can assist you in the future.   Referrals/Orders/Follow-Ups/Clinician Recommendations: Aim for 30 minutes of exercise or brisk walking, 6-8 glasses of water, and 5 servings of fruits and vegetables each day.  This is a list of the screening recommended for you and due dates:  Health Maintenance  Topic Date Due   DEXA scan (bone density measurement)  11/09/2019   COVID-19 Vaccine (5 - 2024-25 season) 11/22/2022   Zoster (Shingles) Vaccine (1 of 2) 05/25/2023*   DTaP/Tdap/Td vaccine (1 - Tdap) 02/24/2024*   Pneumonia Vaccine (1 of 2 - PCV) 02/24/2024*   Mammogram  07/01/2023   Eye exam for diabetics  07/28/2023   Hemoglobin A1C  08/25/2023   Complete foot exam   02/04/2024   Yearly kidney health urinalysis for diabetes  02/24/2024   Yearly kidney function blood test for diabetes  04/15/2024   Medicare Annual Wellness Visit  04/26/2024   Flu Shot  Completed   HPV Vaccine  Aged Out  *Topic was postponed. The date shown is not the original due date.    Advanced directives: (ACP Link)Information on Advanced Care Planning can be found at [place]  Secretary of [facility] Advance Health Care Directives Advance Health Care Directives ([email or web address]   Next Medicare Annual Wellness Visit scheduled for next year: Yes

## 2023-05-03 ENCOUNTER — Other Ambulatory Visit: Payer: Self-pay | Admitting: Family Medicine

## 2023-05-11 DIAGNOSIS — L84 Corns and callosities: Secondary | ICD-10-CM | POA: Diagnosis not present

## 2023-05-11 DIAGNOSIS — B351 Tinea unguium: Secondary | ICD-10-CM | POA: Diagnosis not present

## 2023-05-11 DIAGNOSIS — M79676 Pain in unspecified toe(s): Secondary | ICD-10-CM | POA: Diagnosis not present

## 2023-05-11 DIAGNOSIS — E1142 Type 2 diabetes mellitus with diabetic polyneuropathy: Secondary | ICD-10-CM | POA: Diagnosis not present

## 2023-05-14 ENCOUNTER — Other Ambulatory Visit: Payer: Self-pay | Admitting: Family Medicine

## 2023-05-25 ENCOUNTER — Encounter: Payer: Self-pay | Admitting: Family Medicine

## 2023-05-25 ENCOUNTER — Ambulatory Visit (INDEPENDENT_AMBULATORY_CARE_PROVIDER_SITE_OTHER): Payer: Medicare Other | Admitting: Family Medicine

## 2023-05-25 VITALS — BP 137/66 | HR 74 | Temp 97.8°F | Ht 63.0 in | Wt 182.0 lb

## 2023-05-25 DIAGNOSIS — E1140 Type 2 diabetes mellitus with diabetic neuropathy, unspecified: Secondary | ICD-10-CM | POA: Diagnosis not present

## 2023-05-25 DIAGNOSIS — Z951 Presence of aortocoronary bypass graft: Secondary | ICD-10-CM

## 2023-05-25 DIAGNOSIS — E1159 Type 2 diabetes mellitus with other circulatory complications: Secondary | ICD-10-CM

## 2023-05-25 DIAGNOSIS — E1142 Type 2 diabetes mellitus with diabetic polyneuropathy: Secondary | ICD-10-CM

## 2023-05-25 DIAGNOSIS — R5383 Other fatigue: Secondary | ICD-10-CM

## 2023-05-25 DIAGNOSIS — E782 Mixed hyperlipidemia: Secondary | ICD-10-CM

## 2023-05-25 DIAGNOSIS — C50912 Malignant neoplasm of unspecified site of left female breast: Secondary | ICD-10-CM

## 2023-05-25 DIAGNOSIS — I152 Hypertension secondary to endocrine disorders: Secondary | ICD-10-CM

## 2023-05-25 LAB — BAYER DCA HB A1C WAIVED: HB A1C (BAYER DCA - WAIVED): 6.6 % — ABNORMAL HIGH (ref 4.8–5.6)

## 2023-05-25 MED ORDER — ALLOPURINOL 100 MG PO TABS: 100.0000 mg | ORAL_TABLET | Freq: Two times a day (BID) | ORAL | 0 refills | Status: DC

## 2023-05-25 MED ORDER — FEXOFENADINE-PSEUDOEPHED ER 60-120 MG PO TB12: 1.0000 | ORAL_TABLET | Freq: Two times a day (BID) | ORAL | 5 refills | Status: DC

## 2023-05-25 MED ORDER — PREGABALIN 75 MG PO CAPS: 75.0000 mg | ORAL_CAPSULE | Freq: Three times a day (TID) | ORAL | 1 refills | Status: DC

## 2023-05-25 MED ORDER — ICOSAPENT ETHYL 1 G PO CAPS: 2.0000 g | ORAL_CAPSULE | Freq: Two times a day (BID) | ORAL | 3 refills | Status: AC

## 2023-05-25 MED ORDER — FUROSEMIDE 20 MG PO TABS: 20.0000 mg | ORAL_TABLET | Freq: Every day | ORAL | 0 refills | Status: DC

## 2023-05-25 MED ORDER — FLUTICASONE PROPIONATE 50 MCG/ACT NA SUSP: 2.0000 | Freq: Every day | NASAL | 6 refills | Status: AC

## 2023-05-25 MED ORDER — METOPROLOL TARTRATE 25 MG PO TABS: 25.0000 mg | ORAL_TABLET | Freq: Two times a day (BID) | ORAL | 0 refills | Status: DC

## 2023-05-25 NOTE — Progress Notes (Signed)
 Subjective:  Patient ID: Carmen [name], female    DOB: 09-14-1940  Age: 83 y.o. MRN: [id number]  CC: Medical Management of Chronic Issues, Sinus Problem (Stopped up and then drainage. Sinuses feel swollen at times. Only on left said. Ongoing for months. /Would like to start back on flonase or fluticasone. ), and Neck Pain (Stiff neck that last a while but doing a little better now. Hurts when she goes to get up mostly or with movement. Neck felt swollen and could feel nodules in neck swollen. )   HPI Carmen [name] presents for presents forFollow-up of diabetes. Patient checks blood sugar at home.   150-200 fasting and not checking  postprandial Patient denies symptoms such as polyuria, polydipsia, excessive hunger, nausea No significant hypoglycemic spells noted. Medications reviewed. Pt reports taking them regularly without complication/adverse reaction being reported today.  Lab Results  Component Value Date   HGBA1C 6.1 (H) 02/24/2023   HGBA1C 6.1 (A) 02/04/2023   HGBA1C 6.3 (A) 08/03/2022        05/25/2023    1:09 PM 04/27/2023    2:38 PM 02/24/2023    3:13 PM  Depression screen PHQ 2/9  Decreased Interest 0 0 0  Down, Depressed, Hopeless 0 0 0  PHQ - 2 Score 0 0 0  Altered sleeping 0    Tired, decreased energy 2    Change in appetite 0    Feeling bad or failure about yourself  0    Trouble concentrating 0    Moving slowly or fidgety/restless 3    Suicidal thoughts 0    PHQ-9 Score 5    Difficult doing work/chores Not difficult at all      History Carmen [name] has a past medical history of Arthritis, Asymptomatic PVCs, Bilateral shoulder pain, CAD (coronary artery disease), Cancer (HCC) (11/2015), Chronic renal insufficiency, stage 3 (moderate) (HCC), Diabetes mellitus without complication (HCC), Diverticulosis, Endometrial polyp, External hemorrhoids, Goals of care, counseling/discussion (02/07/2021), Gout, Hemochromatosis, hereditary (HCC) (11/24/2012), History of  anemia, History of duodenal ulcer (1990), History of radiation therapy, Hyperlipidemia, Hypertension, Internal hemorrhoids, Jaundice, Myocardial infarction (HCC) (2007), Neuromuscular disorder (HCC), PMB (postmenopausal bleeding), PONV (postoperative nausea and vomiting), Prolapse of female pelvic organs, Seizures (HCC), Stage I breast cancer, left (HCC) (12/16/2020), Tick bite (08/12/2017), Type 2 diabetes mellitus with hyperglycemia, without long-term current use of insulin (HCC) (01/27/2019), and Vitamin D deficiency.   She has a past surgical history that includes Lipoma excision; Breast surgery; Cardiac catheterization (06/03/2005); Colonoscopy (11/28/2001); Upper gi endoscopy (01/20/1989); Coronary artery bypass graft (2007); Dilatation & curettage/hysteroscopy with myosure (N/A, 09/28/2017); Breast lumpectomy with radioactive seed and sentinel lymph node biopsy (Left, 01/14/2021); Portacath placement (Right, 01/14/2021); and Lipoma excision (Left, 01/14/2021).   Her family history includes Arthritis in her sister, sister, and sister; Cancer (age of onset: 72) in her sister; Diabetes in her brother, brother, brother, and father; Diverticulitis in her sister; Hemachromatosis in her brother, sister, sister, and sister; Hypertension in her father and mother; Liver cancer (age of onset: 49) in her maternal grandmother; Lung cancer in her brother; Lupus in her sister; Melanoma (age of onset: 48) in her daughter; Neuropathy in her mother; Stroke in her father and mother.She reports that she has never smoked. She has never used smokeless tobacco. She reports current alcohol use. She reports that she does not use drugs.++-    ROS Review of Systems  Constitutional: Negative.   HENT:  Negative for congestion.   Eyes:  Negative for visual disturbance.  Respiratory:  Negative for shortness of breath.   Cardiovascular:  Negative for chest pain.  Gastrointestinal:  Negative for abdominal pain, constipation,  diarrhea, nausea and vomiting.  Genitourinary:  Negative for difficulty urinating.  Musculoskeletal:  Positive for arthralgias (hands, due to neuropathy. Partial relief with lyrica). Negative for myalgias.  Neurological:  Negative for headaches.  Psychiatric/Behavioral:  Negative for sleep disturbance.     Objective:  BP 137/66   Pulse 74   Temp 97.8 F (36.6 C)   Ht 5\' 3"  (1.6 m)   Wt 182 lb (82.6 kg)   SpO2 94%   BMI 32.24 kg/m   BP Readings from Last 3 Encounters:  05/25/23 137/66  04/16/23 (!) 133/54  03/01/23 135/79    Wt Readings from Last 3 Encounters:  05/25/23 182 lb (82.6 kg)  04/27/23 183 lb (83 kg)  04/16/23 183 lb (83 kg)     Physical Exam Constitutional:      General: She is not in acute distress.    Appearance: She is well-developed.  HENT:     Head: Normocephalic and atraumatic.  Eyes:     Conjunctiva/sclera: Conjunctivae normal.     Pupils: Pupils are equal, round, and reactive to light.  Neck:     Thyroid: No thyromegaly.  Cardiovascular:     Rate and Rhythm: Normal rate and regular rhythm.     Heart sounds: Normal heart sounds. No murmur heard. Pulmonary:     Effort: Pulmonary effort is normal. No respiratory distress.     Breath sounds: Normal breath sounds. No wheezing or rales.  Abdominal:     General: Bowel sounds are normal. There is no distension.     Palpations: Abdomen is soft.     Tenderness: There is no abdominal tenderness.  Musculoskeletal:        General: Normal range of motion.     Cervical back: Normal range of motion and neck supple.  Lymphadenopathy:     Head:     Right side of head: No submental, submandibular, preauricular or posterior auricular adenopathy.     Left side of head: No submental, submandibular, preauricular or posterior auricular adenopathy.     Cervical: No cervical adenopathy.     Right cervical: No superficial or posterior cervical adenopathy.    Left cervical: No superficial or posterior cervical  adenopathy.  Skin:    General: Skin is warm and dry.  Neurological:     Mental Status: She is alert and oriented to person, place, and time.  Psychiatric:        Behavior: Behavior normal.        Thought Content: Thought content normal.        Judgment: Judgment normal.       Assessment & Plan:   Carmen [name] was seen today for medical management of chronic issues, sinus problem and neck pain.  Diagnoses and all orders for this visit:  Type 2 diabetes mellitus with diabetic polyneuropathy, without long-term current use of insulin (HCC) -     Bayer DCA Hb A1c Waived  Hypertension associated with diabetes (HCC) -     CBC with Differential/Platelet -     CMP14+EGFR -     furosemide (LASIX) 20 MG tablet; Take 1 tablet (20 mg total) by mouth daily.  Fatigue, unspecified type -     CBC with Differential/Platelet -     CMP14+EGFR  Mixed hyperlipidemia -     Lipid panel -     icosapent Ethyl (VASCEPA) 1  g capsule; Take 2 capsules (2 g total) by mouth 2 (two) times daily.  Stage I breast cancer, left (HCC) -     CBC with Differential/Platelet  S/P CABG x 2 -     furosemide (LASIX) 20 MG tablet; Take 1 tablet (20 mg total) by mouth daily.  Type 2 diabetes mellitus with diabetic neuropathy, without long-term current use of insulin (HCC) -     pregabalin (LYRICA) 75 MG capsule; Take 1 capsule (75 mg total) by mouth 3 (three) times daily.  Other orders -     allopurinol (ZYLOPRIM) 100 MG tablet; Take 1 tablet (100 mg total) by mouth 2 (two) times daily. -     metoprolol tartrate (LOPRESSOR) 25 MG tablet; Take 1 tablet (25 mg total) by mouth 2 (two) times daily. -     fluticasone (FLONASE) 50 MCG/ACT nasal spray; Place 2 sprays into both nostrils daily. -     fexofenadine-pseudoephedrine (ALLEGRA-D) 60-120 MG 12 hr tablet; Take 1 tablet by mouth 2 (two) times daily.       I have changed Carmen [name]'s Vascepa to icosapent Ethyl. I have also changed her furosemide,  allopurinol, and metoprolol tartrate. I am also having her start on fluticasone and fexofenadine-pseudoephedrine. Additionally, I am having her maintain her aspirin, Accu-Chek [employer]ix Lancet, colchicine, meclizine, Vitamin D3, Vitamin D (Ergocalciferol), UNABLE TO FIND, pyridoxine, lidocaine-prilocaine, Praluent, Rybelsus, glipiZIDE, onetouch ultrasoft, glucose blood, mometasone, olmesartan, diclofenac Sodium, and pregabalin.  Allergies as of 05/25/2023       Reactions   Zofran [ondansetron Hcl] Other (See Comments)   Severe headache and abdominal pain   Cymbalta [duloxetine Hcl] Other (See Comments)   Profuse sweating   Farxiga [dapagliflozin] Other (See Comments)   Dizziness   Januvia [sitagliptin] Swelling   Meloxicam Nausea And Vomiting   Shellfish Allergy Other (See Comments)   Gout   Trulicity [dulaglutide] Nausea Only   Diclofenac Diarrhea   Lipitor [atorvastatin] Other (See Comments)   Muscle aches   Livalo [pitavastatin] Other (See Comments)      Sulfa Antibiotics Rash   Tape Other (See Comments)   Causes skin irritation   Zetia [ezetimibe] Other (See Comments)   Muscle aches   Zocor [simvastatin] Other (See Comments)   Muscle aches         Medication List        Accurate as of May 25, 2023  1:56 PM. If you have any questions, ask your nurse or doctor.          Accu-Chek [employer] Kit Use to check Blood Sugars   allopurinol 100 MG tablet Commonly known as: ZYLOPRIM Take 1 tablet (100 mg total) by mouth 2 (two) times daily.   aspirin 81 MG tablet Take 81 mg by mouth daily.   colchicine 0.6 MG tablet TAKE (1) TABLET DAILY AS NEEDED.   diclofenac Sodium 1 % Gel Commonly known as: VOLTAREN APPLY FOUR grams TO affected AREAS FOUR TIMES DAILY AS NEEDED   fexofenadine-pseudoephedrine 60-120 MG 12 hr tablet Commonly known as: ALLEGRA-D Take 1 tablet by mouth 2 (two) times daily. Started by: Fatumata Kashani   fluticasone 50 MCG/ACT nasal  spray Commonly known as: FLONASE Place 2 sprays into both nostrils daily. Started by: Alfonzia Woolum   furosemide 20 MG tablet Commonly known as: LASIX Take 1 tablet (20 mg total) by mouth daily.   glipiZIDE 5 MG tablet Commonly known as: GLUCOTROL Take 1 tablet (5 mg total) by mouth 2 (two) times daily  before a meal.   glucose blood test strip Commonly known as: ONE TOUCH ULTRA TEST 1 each by Other route daily in the afternoon. CHECK BLOOD SUGAR 2 TIMES A DAY   icosapent Ethyl 1 g capsule Commonly known as: Vascepa Take 2 capsules (2 g total) by mouth 2 (two) times daily. What changed: See the new instructions. Changed by: Lauriann Milillo   lidocaine-prilocaine cream Commonly known as: EMLA Apply 1 Application topically as needed.   meclizine 12.5 MG tablet Commonly known as: ANTIVERT Take 1 tablet (12.5 mg total) by mouth 3 (three) times daily as needed for dizziness.   metoprolol tartrate 25 MG tablet Commonly known as: LOPRESSOR Take 1 tablet (25 mg total) by mouth 2 (two) times daily.   mometasone 0.1 % cream Commonly known as: ELOCON Apply topically daily.   olmesartan 20 MG tablet Commonly known as: BENICAR TAKE ONE TABLET EVERY DAY   onetouch ultrasoft lancets 1 each by Other route daily in the afternoon. Use as instructed   Praluent 150 MG/ML Soaj Generic drug: Alirocumab Inject 1 Dose into the skin every 14 (fourteen) days.   pregabalin 75 MG capsule Commonly known as: LYRICA Take 1 capsule (75 mg total) by mouth 3 (three) times daily.   pyridoxine 500 MG tablet Commonly known as: B-6 Take 1 tablet (500 mg total) by mouth daily.   Rybelsus 14 MG Tabs Generic drug: Semaglutide Take 1 tablet (14 mg total) by mouth daily.   UNABLE TO FIND Specialty bra s/p breast cancer surgery   Vitamin D (Ergocalciferol) 1.25 MG (50000 UNIT) Caps capsule Commonly known as: DRISDOL Take 1 capsule (50,000 Units total) by mouth every 7 (seven) days.   Vitamin D3  250 MCG (10000 UT) Tabs Take by mouth 3 (three) times a week.         Follow-up: Return in about 6 months (around 11/25/2023).  Mechele Claude, M.D.

## 2023-05-26 ENCOUNTER — Encounter: Payer: Self-pay | Admitting: Family Medicine

## 2023-05-26 LAB — CBC WITH DIFFERENTIAL/PLATELET
Basophils Absolute: 0 10*3/uL (ref 0.0–0.2)
Basos: 1 %
EOS (ABSOLUTE): 0.3 10*3/uL (ref 0.0–0.4)
Eos: 4 %
Hematocrit: 41.4 % (ref 34.0–46.6)
Hemoglobin: 13.7 g/dL (ref 11.1–15.9)
Immature Grans (Abs): 0 10*3/uL (ref 0.0–0.1)
Immature Granulocytes: 1 %
Lymphocytes Absolute: 1.7 10*3/uL (ref 0.7–3.1)
Lymphs: 25 %
MCH: 31.4 pg (ref 26.6–33.0)
MCHC: 33.1 g/dL (ref 31.5–35.7)
MCV: 95 fL (ref 79–97)
Monocytes Absolute: 0.4 10*3/uL (ref 0.1–0.9)
Monocytes: 5 %
Neutrophils Absolute: 4.2 10*3/uL (ref 1.4–7.0)
Neutrophils: 64 %
Platelets: 132 10*3/uL — ABNORMAL LOW (ref 150–450)
RBC: 4.36 x10E6/uL (ref 3.77–5.28)
RDW: 14.4 % (ref 11.7–15.4)
WBC: 6.6 10*3/uL (ref 3.4–10.8)

## 2023-05-26 LAB — CMP14+EGFR
ALT: 17 IU/L (ref 0–32)
AST: 24 IU/L (ref 0–40)
Albumin: 4.2 g/dL (ref 3.7–4.7)
Alkaline Phosphatase: 111 IU/L (ref 44–121)
BUN/Creatinine Ratio: 10 — ABNORMAL LOW (ref 12–28)
BUN: 12 mg/dL (ref 8–27)
Bilirubin Total: 0.4 mg/dL (ref 0.0–1.2)
CO2: 26 mmol/L (ref 20–29)
Calcium: 9.5 mg/dL (ref 8.7–10.3)
Chloride: 101 mmol/L (ref 96–106)
Creatinine, Ser: 1.21 mg/dL — ABNORMAL HIGH (ref 0.57–1.00)
Globulin, Total: 2.1 g/dL (ref 1.5–4.5)
Glucose: 235 mg/dL — ABNORMAL HIGH (ref 70–99)
Potassium: 4.8 mmol/L (ref 3.5–5.2)
Sodium: 139 mmol/L (ref 134–144)
Total Protein: 6.3 g/dL (ref 6.0–8.5)
eGFR: 45 mL/min/{1.73_m2} — ABNORMAL LOW (ref >59)

## 2023-05-26 LAB — LIPID PANEL
Chol/HDL Ratio: 4 ratio (ref 0.0–4.4)
Cholesterol, Total: 147 mg/dL (ref 100–199)
HDL: 37 mg/dL — ABNORMAL LOW (ref >39)
LDL Chol Calc (NIH): 63 mg/dL (ref 0–99)
Triglycerides: 294 mg/dL — ABNORMAL HIGH (ref 0–149)
VLDL Cholesterol Cal: 47 mg/dL — ABNORMAL HIGH (ref 5–40)

## 2023-05-26 NOTE — Progress Notes (Signed)
Hello Velicia,  Your lab result is normal and/or stable.Some minor variations that are not significant are commonly marked abnormal, but do not represent any medical problem for you.  Best regards, Claretta Fraise, M.D.

## 2023-06-01 ENCOUNTER — Telehealth: Payer: Self-pay

## 2023-06-01 ENCOUNTER — Inpatient Hospital Stay: Payer: Medicare Other | Attending: Hematology & Oncology

## 2023-06-01 DIAGNOSIS — Z853 Personal history of malignant neoplasm of breast: Secondary | ICD-10-CM | POA: Diagnosis not present

## 2023-06-01 NOTE — Telephone Encounter (Signed)
 Pharmacy Patient Advocate Encounter   Received notification from Pt Calls Messages that prior authorization for Rybelsus 14mg  is required/requested.   Insurance verification completed.   The patient is insured through [facility] .   Per test claim: PA required; PA submitted to above mentioned insurance via CoverMyMeds Key/confirmation #/EOC [facility] Status is pending

## 2023-06-01 NOTE — Patient Instructions (Signed)

## 2023-06-01 NOTE — Telephone Encounter (Signed)
 Rybelsus needs PA

## 2023-06-10 DIAGNOSIS — Z85820 Personal history of malignant melanoma of skin: Secondary | ICD-10-CM | POA: Diagnosis not present

## 2023-06-10 DIAGNOSIS — L080 Pyoderma: Secondary | ICD-10-CM | POA: Diagnosis not present

## 2023-06-10 DIAGNOSIS — D0339 Melanoma in situ of other parts of face: Secondary | ICD-10-CM | POA: Diagnosis not present

## 2023-06-10 DIAGNOSIS — D2262 Melanocytic nevi of left upper limb, including shoulder: Secondary | ICD-10-CM | POA: Diagnosis not present

## 2023-06-10 DIAGNOSIS — L905 Scar conditions and fibrosis of skin: Secondary | ICD-10-CM | POA: Diagnosis not present

## 2023-06-10 DIAGNOSIS — Z85828 Personal history of other malignant neoplasm of skin: Secondary | ICD-10-CM | POA: Diagnosis not present

## 2023-06-10 DIAGNOSIS — D485 Neoplasm of uncertain behavior of skin: Secondary | ICD-10-CM | POA: Diagnosis not present

## 2023-06-10 DIAGNOSIS — L821 Other seborrheic keratosis: Secondary | ICD-10-CM | POA: Diagnosis not present

## 2023-06-10 DIAGNOSIS — D225 Melanocytic nevi of trunk: Secondary | ICD-10-CM | POA: Diagnosis not present

## 2023-06-10 DIAGNOSIS — D2271 Melanocytic nevi of right lower limb, including hip: Secondary | ICD-10-CM | POA: Diagnosis not present

## 2023-06-15 DIAGNOSIS — D2371 Other benign neoplasm of skin of right lower limb, including hip: Secondary | ICD-10-CM | POA: Diagnosis not present

## 2023-06-15 DIAGNOSIS — M79671 Pain in right foot: Secondary | ICD-10-CM | POA: Diagnosis not present

## 2023-06-18 ENCOUNTER — Inpatient Hospital Stay: Payer: Medicare Other

## 2023-06-18 ENCOUNTER — Ambulatory Visit: Payer: Medicare Other | Admitting: Hematology & Oncology

## 2023-06-18 ENCOUNTER — Other Ambulatory Visit: Payer: Medicare Other

## 2023-06-23 ENCOUNTER — Encounter: Payer: Self-pay | Admitting: *Deleted

## 2023-06-23 NOTE — Progress Notes (Signed)
 Patient called to notify this office that she has been diagnosed with an Early Stage Melanoma on her chin. She is scheduled on 4/16 with [facility] for removal. She states Dr Karlyn Agee or Dr Irene Limbo will be the physician.   Requested that when she go for her procedure she ask them to send all records to this office. She agreed. Will follow up to ensure our office receives path report.   Oncology Nurse Navigator Documentation     06/23/2023    2:00 PM  Oncology Nurse Navigator Flowsheets  Navigator Location [facility]  Navigator Encounter Type Telephone  Telephone Incoming Call  Patient Visit Type MedOnc  Treatment Phase Post-Tx Follow-up  Barriers/Navigation Needs No Barriers At This Time  Interventions Education  Acuity Level 1-No Barriers  Time Spent with Patient 15

## 2023-07-05 ENCOUNTER — Other Ambulatory Visit: Payer: Self-pay | Admitting: Family Medicine

## 2023-07-07 DIAGNOSIS — D0339 Melanoma in situ of other parts of face: Secondary | ICD-10-CM | POA: Diagnosis not present

## 2023-07-15 ENCOUNTER — Encounter: Payer: Self-pay | Admitting: *Deleted

## 2023-07-15 NOTE — Progress Notes (Signed)
 [facility] [phone]) to follow up on patient's path report. They stated that path was still pending, but also included that Dr Maria Shiner is not on her list of MDs to communicate with and they will need a release from the patient.   Called and spoke to [place]. She states she told that MD that she wanted Dr Maria Shiner to have results, but she will reach out again for report to be shared with this office.   Oncology Nurse Navigator Documentation     07/15/2023    9:30 AM  Oncology Nurse Navigator Flowsheets  Navigator Location [facility]  Navigator Encounter Type Telephone  Telephone Outgoing Call  Patient Visit Type MedOnc  Treatment Phase Post-Tx Follow-up  Barriers/Navigation Needs No Barriers At This Time  Interventions Education  Acuity Level 1-No Barriers  Time Spent with Patient 15

## 2023-07-16 ENCOUNTER — Encounter: Payer: Self-pay | Admitting: *Deleted

## 2023-07-16 NOTE — Progress Notes (Signed)
 Received path reports from [facility]. Both shared with Dr Maria Shiner and placed in scan bin. Path confirms Melanoma in situ with negative margins.   Oncology Nurse Navigator Documentation     07/16/2023   12:30 PM  Oncology Nurse Navigator Flowsheets  Navigator Location [facility]  Navigator Encounter Type Pathology Review  Patient Visit Type MedOnc  Treatment Phase Post-Tx Follow-up  Barriers/Navigation Needs No Barriers At This Time  Interventions None Required  Acuity Level 1-No Barriers  Time Spent with Patient 15

## 2023-07-20 DIAGNOSIS — B351 Tinea unguium: Secondary | ICD-10-CM | POA: Diagnosis not present

## 2023-07-20 DIAGNOSIS — L84 Corns and callosities: Secondary | ICD-10-CM | POA: Diagnosis not present

## 2023-07-20 DIAGNOSIS — M79674 Pain in right toe(s): Secondary | ICD-10-CM | POA: Diagnosis not present

## 2023-07-20 DIAGNOSIS — E1142 Type 2 diabetes mellitus with diabetic polyneuropathy: Secondary | ICD-10-CM | POA: Diagnosis not present

## 2023-07-20 DIAGNOSIS — M79675 Pain in left toe(s): Secondary | ICD-10-CM | POA: Diagnosis not present

## 2023-07-26 ENCOUNTER — Inpatient Hospital Stay: Payer: Medicare Other

## 2023-07-30 NOTE — Telephone Encounter (Signed)
 Pharmacy Patient Advocate Encounter  Received notification from [employer] that Prior Authorization for Rybelsus  has been APPROVED through 03/22/2024   PA #/Case ID/Reference #:  [id number]

## 2023-08-04 ENCOUNTER — Ambulatory Visit: Payer: Medicare Other | Admitting: Internal Medicine

## 2023-08-04 ENCOUNTER — Other Ambulatory Visit: Payer: Self-pay | Admitting: Internal Medicine

## 2023-08-09 ENCOUNTER — Other Ambulatory Visit: Payer: Self-pay | Admitting: Family Medicine

## 2023-08-11 ENCOUNTER — Ambulatory Visit (INDEPENDENT_AMBULATORY_CARE_PROVIDER_SITE_OTHER): Admitting: Nurse Practitioner

## 2023-08-11 ENCOUNTER — Encounter: Payer: Self-pay | Admitting: Nurse Practitioner

## 2023-08-11 ENCOUNTER — Ambulatory Visit: Payer: Self-pay

## 2023-08-11 VITALS — BP 108/62 | HR 87 | Temp 97.8°F | Ht 63.0 in | Wt 177.4 lb

## 2023-08-11 DIAGNOSIS — B9689 Other specified bacterial agents as the cause of diseases classified elsewhere: Secondary | ICD-10-CM | POA: Insufficient documentation

## 2023-08-11 DIAGNOSIS — J028 Acute pharyngitis due to other specified organisms: Secondary | ICD-10-CM | POA: Diagnosis not present

## 2023-08-11 DIAGNOSIS — R051 Acute cough: Secondary | ICD-10-CM | POA: Diagnosis not present

## 2023-08-11 MED ORDER — BENZONATATE 100 MG PO CAPS: 100.0000 mg | ORAL_CAPSULE | Freq: Three times a day (TID) | ORAL | 0 refills | Status: DC | PRN

## 2023-08-11 MED ORDER — CEFDINIR 300 MG PO CAPS: 300.0000 mg | ORAL_CAPSULE | Freq: Two times a day (BID) | ORAL | 0 refills | Status: DC

## 2023-08-11 NOTE — Progress Notes (Signed)
 Acute Office Visit  Subjective:     Patient ID: Carmen [name], female    DOB: 05-01-1940, 83 y.o.   MRN: [id number]  Chief Complaint  Patient presents with   Sore Throat    Symptoms for almost 2 weeks    Cough    HPI  Carmen [name] is a 83 y.o. female presenting Aug 11, 2023 for an acute visit  who complains of swollen glands and cough described as productive of yellow sputum for 2-weeks.no swab ordered dur the length of the illness  She denies a history of anorexia, chest pain, chills, dizziness, fevers, nausea, shortness of breath, vomiting, and wheezing and denies a history of asthma. Patient denies smoke cigarettes.   Active Ambulatory Problems    Diagnosis Date Noted   Gout 06/20/2012   Hypertension associated with diabetes (HCC) 06/20/2012   HLD (hyperlipidemia) 06/20/2012   Arthritis 06/20/2012   S/P CABG x 2 08/09/2012   PVC's (premature ventricular contractions) 08/09/2012   Hemochromatosis, hereditary (HCC) 11/24/2012   Neuromuscular disorder (HCC)    Statin intolerance 08/18/2013   Non-insulin  treated type 2 diabetes mellitus (HCC) 11/12/2015   Vitamin D  deficiency 01/28/2016   Chronic pain of both shoulders 04/27/2017   Chronic renal insufficiency, stage 3 (moderate) (HCC) 09/10/2017   Type 2 diabetes mellitus with stage 3b chronic kidney disease, without long-term current use of insulin  (HCC) 01/27/2019   Type 2 diabetes mellitus with diabetic polyneuropathy, without long-term current use of insulin  (HCC) 12/29/2019   Stage I breast cancer, left (HCC) 12/16/2020   Current moderate episode of major depressive disorder without prior episode (HCC) 12/26/2020   Coronary artery disease of bypass graft of native heart with stable angina pectoris (HCC) 12/26/2020   Type 2 diabetes mellitus with hyperglycemia, without long-term current use of insulin  (HCC) 01/20/2021   Genetic testing 04/03/2021   Malignant neoplasm of lower-outer quadrant of left breast of  female, estrogen receptor negative (HCC) 12/18/2020   Acute bacterial pharyngitis 08/11/2023   Resolved Ambulatory Problems    Diagnosis Date Noted   DM (diabetes mellitus) type II controlled, neurological manifestation (HCC) 06/20/2012   Pain 10/20/2012   Pre-operative clearance 09/10/2017   Type 2 diabetes mellitus with hyperglycemia, without long-term current use of insulin  (HCC) 01/27/2019   Type 2 diabetes mellitus with stage 3a chronic kidney disease, without long-term current use of insulin  (HCC) 12/29/2019   Past Medical History:  Diagnosis Date   Asymptomatic PVCs    Bilateral shoulder pain    CAD (coronary artery disease)    Cancer (HCC) 11/2015   Diabetes mellitus without complication (HCC)    Diverticulosis    Endometrial polyp    External hemorrhoids    Goals of care, counseling/discussion 02/07/2021   History of anemia    History of duodenal ulcer 1990   History of radiation therapy    Hyperlipidemia    Hypertension    Internal hemorrhoids    Jaundice    Myocardial infarction (HCC) 2007   PMB (postmenopausal bleeding)    PONV (postoperative nausea and vomiting)    Prolapse of female pelvic organs    Seizures (HCC)    Tick bite 08/12/2017    Review of Systems  Constitutional:  Negative for chills and fever.  HENT:  Negative for congestion.        Itchy scratchy throat  Respiratory:  Positive for sputum production.        Yellow  Cardiovascular:  Negative for chest pain and leg  swelling.  Gastrointestinal:  Negative for abdominal pain, diarrhea, nausea and vomiting.  Skin:  Negative for itching and rash.  Neurological:  Negative for dizziness and headaches.   Negative unless indicated in HPI    Objective:    BP 108/62   Pulse 87   Temp 97.8 F (36.6 C) (Temporal)   Ht 5\' 3"  (1.6 m)   Wt 177 lb 6.4 oz (80.5 kg)   SpO2 97%   BMI 31.42 kg/m  BP Readings from Last 3 Encounters:  08/11/23 108/62  05/25/23 137/66  04/16/23 (!) 133/54   Wt  Readings from Last 3 Encounters:  08/11/23 177 lb 6.4 oz (80.5 kg)  05/25/23 182 lb (82.6 kg)  04/27/23 183 lb (83 kg)      Physical Exam Vitals and nursing note reviewed.  Constitutional:      General: She is not in acute distress. HENT:     Head: Normocephalic and atraumatic.     Right Ear: Tympanic membrane, ear canal and external ear normal. No drainage or swelling. There is no impacted cerumen.     Left Ear: Tympanic membrane, ear canal and external ear normal. There is no impacted cerumen.     Nose: Nose normal. No congestion.     Mouth/Throat:     Mouth: Mucous membranes are moist.  Eyes:     Conjunctiva/sclera: Conjunctivae normal.     Pupils: Pupils are equal, round, and reactive to light.  Cardiovascular:     Heart sounds: Normal heart sounds.  Pulmonary:     Effort: Pulmonary effort is normal.     Breath sounds: Normal breath sounds.  Musculoskeletal:        General: Normal range of motion.  Lymphadenopathy:     Cervical: Cervical adenopathy present.     Right cervical: Deep cervical adenopathy present.     Left cervical: Deep cervical adenopathy present.  Skin:    General: Skin is warm and dry.  Neurological:     Mental Status: She is alert and oriented to person, place, and time.     Comments: Use a cane for ambulation  Psychiatric:        Mood and Affect: Mood normal.        Behavior: Behavior normal.        Thought Content: Thought content normal.        Judgment: Judgment normal.     No results found for any visits on 08/11/23.      Assessment & Plan:  Acute bacterial pharyngitis -     Cefdinir; Take 1 capsule (300 mg total) by mouth 2 (two) times daily.  Dispense: 14 capsule; Refill: 0  Acute cough -     Benzonatate; Take 1 capsule (100 mg total) by mouth 3 (three) times daily as needed for cough.  Dispense: 20 capsule; Refill: 0  Carmen [name] is 83 yrs old Caucasian female seen today for pharyngitis, no acute distress Cefdinir 300 mg 1 tablet  twice daily for 7-day #14 dispense Cough: Tessalon Perles 100 mg 1 tab twice daily as needed Symptomatic therapy suggested: push fluids, rest, and return office visit prn if symptoms persist or worsen. Lack of antibiotic effectiveness discussed with her. Call or return to clinic prn if these symptoms worsen or fail to improve as anticipated.   The above assessment and management plan was discussed with the patient. The patient verbalized understanding of and has agreed to the management plan. Patient is aware to call the clinic if they develop  any new symptoms or if symptoms persist or worsen. Patient is aware when to return to the clinic for a follow-up visit. Patient educated on when it is appropriate to go to the emergency department.   Return if symptoms worsen or fail to improve.  Tannya Gonet [place], DNP [facility] [address] [place], [place] [address] [phone]  Note: This document was prepared by [employer] voice dictation technology and any errors that results from this process are unintentional.

## 2023-08-11 NOTE — Telephone Encounter (Signed)
 Appointment made. LS

## 2023-08-11 NOTE — Telephone Encounter (Signed)
 Copied from CRM [id number]. Topic: Clinical - Red Word Triage >> Aug 11, 2023 10:43 AM Tiffany H wrote: Red Word that prompted transfer to Nurse Triage: Patient called to advise that a sore throat started on Mother's day. There was a slight fever. She's had a persistent cough with productive green mucus. Patient advised that she's fatigued and has been trying different medications to treat the sore throat. It will not go away.   Patient feels as though she may have had laryngitis last week. Her voice left and then returned.  Chief Complaint: sore throat, cough Symptoms: sore throat, red & hoarse throat with yellow mucous in throat, cough with green sputum,  Frequency: 08/01/2023 Pertinent Negatives: Patient denies bloody mucous Disposition: [] ED /[] Urgent Care (no appt availability in office) / [x] Appointment(In office/virtual)/ []  [facility]/ [] Home Care/ [] Refused Recommended Disposition /[] [facility]/ []  Follow-up with PCP Additional Notes: pt has been running low grade temp and is feeling tired.  Scheduled in office visit for today.  Reason for Disposition  Fever present > 3 days (72 hours)  [1] Sore throat is the only symptom AND [2] present > 48 hours  Answer Assessment - Initial Assessment Questions 1. ONSET: "When did the throat start hurting?" (Hours or days ago)      Mother's day 2. SEVERITY: "How bad is the sore throat?" (Scale 1-10; mild, moderate or severe)   - MILD (1-3):  Doesn't interfere with eating or normal activities.   - MODERATE (4-7): Interferes with eating some solids and normal activities.   - SEVERE (8-10):  Excruciating pain, interferes with most normal activities.   - SEVERE WITH DYSPHAGIA (10): Can't swallow liquids, drooling.     moderate 3. STREP EXPOSURE: "Has there been any exposure to strep within the past week?" If Yes, ask: "What type of contact occurred?"      no 4.  VIRAL SYMPTOMS: "Are there any symptoms of a cold, such as a  runny nose, cough, hoarse voice or red eyes?"      Hoarse, red throat, yellowish mucous in throat,  5. FEVER: "Do you have a fever?" If Yes, ask: "What is your temperature, how was it measured, and when did it start?"     Low grade temp 6. PUS ON THE TONSILS: "Is there pus on the tonsils in the back of your throat?"     N/a 7. OTHER SYMPTOMS: "Do you have any other symptoms?" (e.g., difficulty breathing, headache, rash)     np 8. PREGNANCY: "Is there any chance you are pregnant?" "When was your last menstrual period?"     N/a  Answer Assessment - Initial Assessment Questions 1. ONSET: "When did the cough begin?"      Mother's day 2. SEVERITY: "How bad is the cough today?"      moderate 3. SPUTUM: "Describe the color of your sputum" (none, dry cough; clear, white, yellow, green)     green 4. HEMOPTYSIS: "Are you coughing up any blood?" If so ask: "How much?" (flecks, streaks, tablespoons, etc.)     no 5. DIFFICULTY BREATHING: "Are you having difficulty breathing?" If Yes, ask: "How bad is it?" (e.g., mild, moderate, severe)    - MILD: No SOB at rest, mild SOB with walking, speaks normally in sentences, can lie down, no retractions, pulse < 100.    - MODERATE: SOB at rest, SOB with minimal exertion and prefers to sit, cannot lie down flat, speaks in phrases, mild retractions, audible wheezing, pulse 100-120.    -  SEVERE: Very SOB at rest, speaks in single words, struggling to breathe, sitting hunched forward, retractions, pulse > 120      no 6. FEVER: "Do you have a fever?" If Yes, ask: "What is your temperature, how was it measured, and when did it start?"     Low grade 7. CARDIAC HISTORY: "Do you have any history of heart disease?" (e.g., heart attack, congestive heart failure)      N/a 8. LUNG HISTORY: "Do you have any history of lung disease?"  (e.g., pulmonary embolus, asthma, emphysema)     N/a 9. PE RISK FACTORS: "Do you have a history of blood clots?" (or: recent major surgery,  recent prolonged travel, bedridden)     N/a 10. OTHER SYMPTOMS: "Do you have any other symptoms?" (e.g., runny nose, wheezing, chest pain)       Sore throat, hoarse, cough 11. PREGNANCY: "Is there any chance you are pregnant?" "When was your last menstrual period?"       N/a 12. TRAVEL: "Have you traveled out of the country in the last month?" (e.g., travel history, exposures)       N/a  Protocols used: Sore Throat-A-AH, Cough - Acute Productive-A-AH

## 2023-08-13 ENCOUNTER — Encounter: Payer: Self-pay | Admitting: Internal Medicine

## 2023-08-13 ENCOUNTER — Ambulatory Visit (INDEPENDENT_AMBULATORY_CARE_PROVIDER_SITE_OTHER): Payer: Medicare Other | Admitting: Internal Medicine

## 2023-08-13 VITALS — BP 124/78 | HR 83 | Ht 63.0 in | Wt 179.0 lb

## 2023-08-13 DIAGNOSIS — E1122 Type 2 diabetes mellitus with diabetic chronic kidney disease: Secondary | ICD-10-CM

## 2023-08-13 DIAGNOSIS — E1142 Type 2 diabetes mellitus with diabetic polyneuropathy: Secondary | ICD-10-CM

## 2023-08-13 DIAGNOSIS — N1832 Chronic kidney disease, stage 3b: Secondary | ICD-10-CM | POA: Diagnosis not present

## 2023-08-13 DIAGNOSIS — Z7984 Long term (current) use of oral hypoglycemic drugs: Secondary | ICD-10-CM

## 2023-08-13 LAB — POCT GLUCOSE (DEVICE FOR HOME USE): POC Glucose: 160 mg/dL — AB (ref 70–99)

## 2023-08-13 LAB — POCT GLYCOSYLATED HEMOGLOBIN (HGB A1C): Hemoglobin A1C: 6.1 % — AB (ref 4.0–5.6)

## 2023-08-13 NOTE — Progress Notes (Signed)
 Name: Carmen [name]  Age/ Sex: 83 y.o., female   MRN/ DOB: [id number], 1941/01/16     PCP: Roise Cleaver, MD   Reason for Endocrinology Evaluation: Type 2 Diabetes Mellitus  Initial Endocrine Consultative Visit: 09/14/2018    PATIENT IDENTIFIER: Carmen [name] is a 83 y.o. female with a past medical history of HTN, PVC, neuromuscular disorder, hemochromatosis and CAD (S/P CABG) . The patient has followed with Endocrinology clinic since 09/14/2018 for consultative assistance with management of her diabetes.  DIABETIC HISTORY:  Carmen [name] was diagnosed with T2DM in 2016. Has tried oral glycemic agents in the past,Januvia - swelling , Trulicity  - nausea , metformin - elevated LFT's . Her hemoglobin A1c has ranged from 6.6% in 2016, peaking at 8.6% in 2020.  Farxiga  stopped 10/2018 due to vertigo   Rybelsus  started 2/21  SUBJECTIVE:   During the last visit (02/04/2023): A1c was 6.3%.     Today (08/13/2023): Carmen [name] is here for a follow up on diabetes management. She checks glucose occasionally due to painful neuropathy    She continues to follow-up with oncology for breast cancer and hemochromatosis She was diagnosed with left breast ca ( invasive breast ca), She is S/P breast sx 01/14/2022. She finished chemo 07/02/2021 . Completed left breast radiation 09/2021  She had a follow-up with podiatry 03/02/2023 She follows with cardiology for HTN, CAD and dyslipidemia  Denies nausea, vomiting Denies constipation  or diarrhea  She continues with hair loss , on vitamins and Biotin  She has been having congestion and hoarseness    HOME DIABETES REGIMEN:  Glipizide  5 mg twice daily Rybelsus  14 mg daily     METER DOWNLOAD SUMMARY: did not bring    DIABETIC COMPLICATIONS: Microvascular complications:  CKD III, neuropathy  Denies: retinopathy  Last eye exam: Completed 07/28/2022   Macrovascular complications:  CAD (S/P CABG) Denies: PVD, CVA     HISTORY:   Past Medical History:  Past Medical History:  Diagnosis Date   Arthritis    Asymptomatic PVCs    Bilateral shoulder pain    CAD (coronary artery disease)    Cancer (HCC) 11/2015   [id number]  right upper arm   Chronic renal insufficiency, stage 3 (moderate) (HCC)    Diabetes mellitus without complication (HCC)    Diverticulosis    Endometrial polyp    External hemorrhoids    Goals of care, counseling/discussion 02/07/2021   Gout    Hemochromatosis, hereditary (HCC) 11/24/2012   History of anemia    History of duodenal ulcer 1990   History of radiation therapy    Left breast- 08/06/21-09/04/21- Dr. Retta Caster   Hyperlipidemia    Hypertension    Internal hemorrhoids    Jaundice    age 40 or 62   Myocardial infarction (HCC) 2007   Neuromuscular disorder (HCC)    peripheral neuropathy   PMB (postmenopausal bleeding)    PONV (postoperative nausea and vomiting)    Prolapse of female pelvic organs    uses pessary   Seizures (HCC)    had one at Dr. Birt Bulla office after getting blood drawn   Stage I breast cancer, left (HCC) 12/16/2020   Tick bite 08/12/2017   had 3 bites   Type 2 diabetes mellitus with hyperglycemia, without long-term current use of insulin  (HCC) 01/27/2019   Vitamin D  deficiency    Past Surgical History:  Past Surgical History:  Procedure Laterality Date   BREAST LUMPECTOMY WITH RADIOACTIVE SEED AND SENTINEL LYMPH NODE  BIOPSY Left 01/14/2021   Procedure: LEFT BREAST LUMPECTOMY WITH RADIOACTIVE SEED AND SENTINEL LYMPH NODE BIOPSY;  Surgeon: Caralyn Chandler, MD;  Location: [facility];  Service: General;  Laterality: Left;   BREAST SURGERY     left breast lump--benign   CARDIAC CATHETERIZATION  06/03/2005   COLONOSCOPY  11/28/2001   CORONARY ARTERY BYPASS GRAFT  2007   x2 Dr. Luna Salinas, LIMA to LAD, SVG to PDA   DILATATION & CURETTAGE/HYSTEROSCOPY WITH MYOSURE N/A 09/28/2017   Procedure: DILATATION & CURETTAGE/HYSTEROSCOPY WITH MYOSURE;   Surgeon: Woodrow Hazy, MD;  Location: [facility];  Service: Gynecology;  Laterality: N/A;   LIPOMA EXCISION     back   LIPOMA EXCISION Left 01/14/2021   Procedure: EXCISION LEFT CHEST WALL LIPOMA;  Surgeon: Caralyn Chandler, MD;  Location: [facility];  Service: General;  Laterality: Left;   PORTACATH PLACEMENT Right 01/14/2021   Procedure: INSERTION PORT-A-CATH;  Surgeon: Caralyn Chandler, MD;  Location: [facility];  Service: General;  Laterality: Right;   UPPER GI ENDOSCOPY  01/20/1989   Social History:  reports that she has never smoked. She has never used smokeless tobacco. She reports current alcohol use. She reports that she does not use drugs. Family History:  Family History  Problem Relation Age of Onset   Stroke Mother    Hypertension Mother    Neuropathy Mother    Stroke Father    Hypertension Father    Diabetes Father    Cancer Sister 65       sarcoma   Arthritis Sister    Arthritis Sister    Lupus Sister    Hemachromatosis Sister    Arthritis Sister    Hemachromatosis Sister    Hemachromatosis Sister    Diverticulitis Sister    Diabetes Brother    Diabetes Brother    Diabetes Brother    Hemachromatosis Brother    Lung cancer Brother    Liver cancer Maternal Grandmother 89   Melanoma Daughter 44     HOME MEDICATIONS: Allergies as of 08/13/2023       Reactions   Zofran  [ondansetron  Hcl] Other (See Comments)   Severe headache and abdominal pain   Cymbalta  [duloxetine  Hcl] Other (See Comments)   Profuse sweating   Farxiga  [dapagliflozin ] Other (See Comments)   Dizziness   Januvia  [sitagliptin ] Swelling   Meloxicam  Nausea And Vomiting   Shellfish Allergy Other (See Comments)   Gout   Trulicity  [dulaglutide ] Nausea Only   Diclofenac  Diarrhea   Lipitor [atorvastatin] Other (See Comments)   Muscle aches   Livalo [pitavastatin] Other (See Comments)      Sulfa Antibiotics Rash   Tape Other (See Comments)    Causes skin irritation   Zetia [ezetimibe] Other (See Comments)   Muscle aches   Zocor [simvastatin] Other (See Comments)   Muscle aches         Medication List        Accurate as of Aug 13, 2023 12:13 PM. If you have any questions, ask your nurse or doctor.          Accu-Chek [employer] Kit Use to check Blood Sugars   allopurinol  100 MG tablet Commonly known as: ZYLOPRIM  TAKE ONE TABLET TWICE DAILY   aspirin 81 MG tablet Take 81 mg by mouth daily.   benzonatate 100 MG capsule Commonly known as: Tessalon Perles Take 1 capsule (100 mg total) by mouth 3 (three) times daily as needed for  cough.   cefdinir 300 MG capsule Commonly known as: OMNICEF Take 1 capsule (300 mg total) by mouth 2 (two) times daily.   colchicine  0.6 MG tablet TAKE (1) TABLET DAILY AS NEEDED.   diclofenac  Sodium 1 % Gel Commonly known as: VOLTAREN  APPLY FOUR grams TO affected AREAS FOUR TIMES DAILY AS NEEDED   fexofenadine -pseudoephedrine 60-120 MG 12 hr tablet Commonly known as: ALLEGRA-D Take 1 tablet by mouth 2 (two) times daily.   Fluad 0.5 ML injection Generic drug: influenza vaccine adjuvanted   fluticasone  50 MCG/ACT nasal spray Commonly known as: FLONASE  Place 2 sprays into both nostrils daily.   furosemide  20 MG tablet Commonly known as: LASIX  Take 1 tablet (20 mg total) by mouth daily.   glipiZIDE  5 MG tablet Commonly known as: GLUCOTROL  Take 1 tablet (5 mg total) by mouth 2 (two) times daily before a meal.   glucose blood test strip Commonly known as: ONE TOUCH ULTRA TEST 1 each by Other route daily in the afternoon. CHECK BLOOD SUGAR 2 TIMES A DAY   icosapent  Ethyl 1 g capsule Commonly known as: Vascepa  Take 2 capsules (2 g total) by mouth 2 (two) times daily.   lidocaine -prilocaine  cream Commonly known as: EMLA  Apply 1 Application topically as needed.   meclizine  12.5 MG tablet Commonly known as: ANTIVERT  Take 1 tablet (12.5 mg total) by mouth 3 (three)  times daily as needed for dizziness.   metoprolol  tartrate 25 MG tablet Commonly known as: LOPRESSOR  Take 1 tablet (25 mg total) by mouth 2 (two) times daily.   mometasone  0.1 % cream Commonly known as: ELOCON  Apply topically daily.   mupirocin  ointment 2 % Commonly known as: BACTROBAN  SMARTSIG:sparingly Topical Daily   olmesartan  20 MG tablet Commonly known as: BENICAR  TAKE ONE TABLET EVERY DAY   onetouch ultrasoft lancets 1 each by Other route daily in the afternoon. Use as instructed   Praluent  150 MG/ML Soaj Generic drug: Alirocumab  Inject 1 Dose into the skin every 14 (fourteen) days.   pregabalin  75 MG capsule Commonly known as: LYRICA  Take 1 capsule (75 mg total) by mouth 3 (three) times daily.   pyridoxine  500 MG tablet Commonly known as: B-6 Take 1 tablet (500 mg total) by mouth daily.   Rybelsus  14 MG Tabs Generic drug: Semaglutide  Take 1 tablet (14 mg total) by mouth daily.   UNABLE TO FIND Specialty bra s/p breast cancer surgery   Vitamin D  (Ergocalciferol ) 1.25 MG (50000 UNIT) Caps capsule Commonly known as: DRISDOL  TAKE ONE CAPSULE ONCE WEEKLY   Vitamin D3 250 MCG (10000 UT) Tabs Take by mouth 3 (three) times a week.         OBJECTIVE:   Vital Signs: BP 124/78 (BP Location: Left Arm, Patient Position: Sitting, Cuff Size: Normal)   Pulse 83   Ht 5\' 3"  (1.6 m)   Wt 179 lb (81.2 kg)   SpO2 99%   BMI 31.71 kg/m   Wt Readings from Last 3 Encounters:  08/13/23 179 lb (81.2 kg)  08/11/23 177 lb 6.4 oz (80.5 kg)  05/25/23 182 lb (82.6 kg)     Exam: General: Pt appears well and is in NAD  Lungs: Clear with good BS bilat   Heart: RRR   Extremities: No  pretibial edema.  Neuro: MS is good with appropriate affect, pt is alert and Ox3     DM foot exam: 03/02/2023 per podiatry      DATA REVIEWED:  Lab Results  Component Value Date  HGBA1C 6.1 (A) 08/13/2023   HGBA1C 6.6 (H) 05/25/2023   HGBA1C 6.1 (H) 02/24/2023    Latest  Reference Range & Units 05/25/23 13:09  Sodium 134 - 144 mmol/L 139  Potassium 3.5 - 5.2 mmol/L 4.8  Chloride 96 - 106 mmol/L 101  CO2 20 - 29 mmol/L 26  Glucose 70 - 99 mg/dL 604 (H)  BUN 8 - 27 mg/dL 12  Creatinine 5.40 - 9.81 mg/dL 1.91 (H)  Calcium  8.7 - 10.3 mg/dL 9.5  BUN/Creatinine Ratio 12 - 28  10 (L)  eGFR >59 mL/min/1.73 45 (L)  Alkaline Phosphatase 44 - 121 IU/L 111  Albumin 3.7 - 4.7 g/dL 4.2  AST 0 - 40 IU/L 24  ALT 0 - 32 IU/L 17  Total Protein 6.0 - 8.5 g/dL 6.3  Total Bilirubin 0.0 - 1.2 mg/dL 0.4    Latest Reference Range & Units 05/25/23 13:09  Total CHOL/HDL Ratio 0.0 - 4.4 ratio 4.0  Cholesterol, Total 100 - 199 mg/dL 478  HDL Cholesterol >29 mg/dL 37 (L)  Triglycerides 0 - 149 mg/dL 562 (H)  VLDL Cholesterol Cal 5 - 40 mg/dL 47 (H)  LDL Chol Calc (NIH) 0 - 99 mg/dL 63      In office BG 130 mg/dL   ASSESSMENT / PLAN / RECOMMENDATIONS:   1) Type 2 Diabetes Mellitus, Optimally  controlled, With neuropathic and CKD III complications - Most recent A1c of 6.1 %. Goal A1c < 7.5 %.    -A1c stable  -A postprandial BG 160 Mg/DL - Freestyle libre not covered by insurance - No change   MEDICATIONS: - Continue  Glipizide  5 mg , 1 tab before  Breakfast and 1 tablet before  Supper  - Continue  Rybelsus  14 mg daily with breakfast    EDUCATION / INSTRUCTIONS: BG monitoring instructions: Patient is instructed to check her blood sugars 3 times a week fasting . Call [facility] if: BG persistently < 70  I reviewed the Rule of 15 for the treatment of hypoglycemia in detail with the patient. Literature supplied.    F/U in 6 months     Signed electronically by: Natale Bail, MD  [facility]  [facility] [address] [place] [place], [place] [address] Phone: [phone] FAX: [phone]   CC: Roise Cleaver, MD [address] [place] [address] Phone: [phone]  Fax:  [phone]  Return to Endocrinology clinic as below: Future Appointments  Date Time Provider Department Center  08/20/2023  1:15 PM [facility] LAB [facility] None  08/20/2023  1:30 PM [facility] INJ NURSE [facility] None  08/20/2023  1:45 PM Ivor Mars, MD [facility] None  09/20/2023  1:30 PM [facility] INJ NURSE [facility] None  11/08/2023  1:30 PM [facility] INJ NURSE [facility] None  12/02/2023  1:10 PM Roise Cleaver, MD [facility] None  04/27/2024  2:30 PM [facility]NUAL WELLNESS VISIT [facility] None

## 2023-08-13 NOTE — Patient Instructions (Signed)
-   Continue  Glipizide  5 mg, 1 tablet before breakfast and 1 tablet before supper   - Continue  Rybelsus  14 mg daily with Breakfast      - HOW TO TREAT LOW BLOOD SUGARS (Blood sugar LESS THAN 70 MG/DL) Please follow the RULE OF 15 for the treatment of hypoglycemia treatment (when your (blood sugars are less than 70 mg/dL)   STEP 1: Take 15 grams of carbohydrates when your blood sugar is low, which includes:  3-4 GLUCOSE TABS  OR 3-4 OZ OF JUICE OR REGULAR SODA OR ONE TUBE OF GLUCOSE GEL    STEP 2: RECHECK blood sugar in 15 MINUTES STEP 3: If your blood sugar is still low at the 15 minute recheck --> then, go back to STEP 1 and treat AGAIN with another 15 grams of carbohydrates.

## 2023-08-19 DIAGNOSIS — Z85828 Personal history of other malignant neoplasm of skin: Secondary | ICD-10-CM | POA: Diagnosis not present

## 2023-08-19 DIAGNOSIS — L738 Other specified follicular disorders: Secondary | ICD-10-CM | POA: Diagnosis not present

## 2023-08-19 DIAGNOSIS — L905 Scar conditions and fibrosis of skin: Secondary | ICD-10-CM | POA: Diagnosis not present

## 2023-08-19 DIAGNOSIS — Z85820 Personal history of malignant melanoma of skin: Secondary | ICD-10-CM | POA: Diagnosis not present

## 2023-08-20 ENCOUNTER — Other Ambulatory Visit: Payer: Self-pay

## 2023-08-20 ENCOUNTER — Inpatient Hospital Stay: Payer: Medicare Other | Attending: Hematology & Oncology

## 2023-08-20 ENCOUNTER — Inpatient Hospital Stay: Payer: Medicare Other

## 2023-08-20 ENCOUNTER — Inpatient Hospital Stay (HOSPITAL_BASED_OUTPATIENT_CLINIC_OR_DEPARTMENT_OTHER): Payer: Medicare Other | Admitting: Hematology & Oncology

## 2023-08-20 ENCOUNTER — Encounter: Payer: Self-pay | Admitting: Hematology & Oncology

## 2023-08-20 DIAGNOSIS — Z9221 Personal history of antineoplastic chemotherapy: Secondary | ICD-10-CM | POA: Insufficient documentation

## 2023-08-20 DIAGNOSIS — Z853 Personal history of malignant neoplasm of breast: Secondary | ICD-10-CM | POA: Diagnosis not present

## 2023-08-20 DIAGNOSIS — C50912 Malignant neoplasm of unspecified site of left female breast: Secondary | ICD-10-CM

## 2023-08-20 DIAGNOSIS — Z923 Personal history of irradiation: Secondary | ICD-10-CM | POA: Diagnosis not present

## 2023-08-20 LAB — CMP (CANCER CENTER ONLY)
ALT: 14 U/L (ref 0–44)
AST: 17 U/L (ref 15–41)
Albumin: 4.3 g/dL (ref 3.5–5.0)
Alkaline Phosphatase: 81 U/L (ref 38–126)
Anion gap: 8 (ref 5–15)
BUN: 20 mg/dL (ref 8–23)
CO2: 28 mmol/L (ref 22–32)
Calcium: 9.3 mg/dL (ref 8.9–10.3)
Chloride: 104 mmol/L (ref 98–111)
Creatinine: 1.3 mg/dL — ABNORMAL HIGH (ref 0.44–1.00)
GFR, Estimated: 41 mL/min — ABNORMAL LOW (ref >60)
Glucose, Bld: 110 mg/dL — ABNORMAL HIGH (ref 70–99)
Potassium: 3.9 mmol/L (ref 3.5–5.1)
Sodium: 140 mmol/L (ref 135–145)
Total Bilirubin: 0.4 mg/dL (ref 0.0–1.2)
Total Protein: 6.9 g/dL (ref 6.5–8.1)

## 2023-08-20 LAB — RETICULOCYTES
Immature Retic Fract: 11.2 % (ref 2.3–15.9)
RBC.: 4.17 MIL/uL (ref 3.87–5.11)
Retic Count, Absolute: 55.9 10*3/uL (ref 19.0–186.0)
Retic Ct Pct: 1.3 % (ref 0.4–3.1)

## 2023-08-20 LAB — CBC WITH DIFFERENTIAL (CANCER CENTER ONLY)
Abs Immature Granulocytes: 0.06 10*3/uL (ref 0.00–0.07)
Basophils Absolute: 0 10*3/uL (ref 0.0–0.1)
Basophils Relative: 1 %
Eosinophils Absolute: 0.3 10*3/uL (ref 0.0–0.5)
Eosinophils Relative: 4 %
HCT: 37.9 % (ref 36.0–46.0)
Hemoglobin: 13.1 g/dL (ref 12.0–15.0)
Immature Granulocytes: 1 %
Lymphocytes Relative: 29 %
Lymphs Abs: 2.1 10*3/uL (ref 0.7–4.0)
MCH: 31.8 pg (ref 26.0–34.0)
MCHC: 34.6 g/dL (ref 30.0–36.0)
MCV: 92 fL (ref 80.0–100.0)
Monocytes Absolute: 0.6 10*3/uL (ref 0.1–1.0)
Monocytes Relative: 8 %
Neutro Abs: 4.2 10*3/uL (ref 1.7–7.7)
Neutrophils Relative %: 57 %
Platelet Count: 139 10*3/uL — ABNORMAL LOW (ref 150–400)
RBC: 4.12 MIL/uL (ref 3.87–5.11)
RDW: 13.6 % (ref 11.5–15.5)
WBC Count: 7.2 10*3/uL (ref 4.0–10.5)
nRBC: 0 % (ref 0.0–0.2)

## 2023-08-20 LAB — FERRITIN: Ferritin: 131 ng/mL (ref 11–307)

## 2023-08-20 MED ORDER — HEPARIN SOD (PORK) LOCK FLUSH 100 UNIT/ML IV SOLN
500.0000 [IU] | Freq: Once | INTRAVENOUS | Status: AC
  Administered 2023-08-20: 500 [IU] via INTRAVENOUS

## 2023-08-20 MED ORDER — SODIUM CHLORIDE 0.9% FLUSH
10.0000 mL | INTRAVENOUS | Status: DC | PRN
  Administered 2023-08-20 (×2): 10 mL via INTRAVENOUS

## 2023-08-20 NOTE — Patient Instructions (Signed)

## 2023-08-20 NOTE — Progress Notes (Signed)
 Hematology and Oncology Follow Up Visit  Carmen [name] [id number] May 13, 1940 83 y.o. 08/20/2023   Principle Diagnosis:  Invasive ductal carcinoma of the left breast-stage IIA (T2N0M0) -- TRIPLE NEGATIVE -- s/p LEFT lobectomy on 01/14/2021 Hemochromatosis (double heterozygote for C282Y and S65C mutations).   Current Therapy:  Phlebotomy to maintain ferritin less than 100 Adriamycin /Cytoxan  - started adjuvant therapy on 02/27/2021, s/p cycle #6/6 -completed on 06/30/2021 XRT to the LEFT breast -- started on 08/06/2021 -- completed 09/23/2021   Interim History:  Carmen [name] is here today for follow-up.  We last saw her back in January.  At that time, everything was doing pretty well.  She is doing fairly well.  She is trying to watch her blood sugars.  She does have the hemochromatosis.  When we last saw her, the ferritin of 56 and iron saturation of 27%.  I think she may have been phlebotomized.  She has had no issues with respect to the breast cancer.  She has had no problems with bony pain.  She has had no change in bowel or bladder habits.  She has had no headache.  She has had no cough or shortness of breath.  Thankfully, she has avoided COVID and Influenza.  She has had a little bit of leg swelling.  She may have a little bit of neuropathy in the feet.  Currently, I would say that her performance status is probably ECOG 1.    Medications:  Allergies as of 08/20/2023       Reactions   Zofran  [ondansetron  Hcl] Other (See Comments)   Severe headache and abdominal pain   Cymbalta  [duloxetine  Hcl] Other (See Comments)   Profuse sweating   Farxiga  [dapagliflozin ] Other (See Comments)   Dizziness   Januvia  [sitagliptin ] Swelling   Meloxicam  Nausea And Vomiting   Shellfish Allergy Other (See Comments)   Gout   Trulicity  [dulaglutide ] Nausea Only   Diclofenac  Diarrhea   Lipitor [atorvastatin] Other (See Comments)   Muscle aches   Livalo [pitavastatin] Other (See  Comments)      Sulfa Antibiotics Rash   Tape Other (See Comments)   Causes skin irritation   Zetia [ezetimibe] Other (See Comments)   Muscle aches   Zocor [simvastatin] Other (See Comments)   Muscle aches         Medication List        Accurate as of Aug 20, 2023  2:03 PM. If you have any questions, ask your nurse or doctor.          Accu-Chek [employer] Kit Use to check Blood Sugars   allopurinol  100 MG tablet Commonly known as: ZYLOPRIM  TAKE ONE TABLET TWICE DAILY   aspirin 81 MG tablet Take 81 mg by mouth daily.   benzonatate 100 MG capsule Commonly known as: Tessalon Perles Take 1 capsule (100 mg total) by mouth 3 (three) times daily as needed for cough.   cefdinir 300 MG capsule Commonly known as: OMNICEF Take 1 capsule (300 mg total) by mouth 2 (two) times daily.   colchicine  0.6 MG tablet TAKE (1) TABLET DAILY AS NEEDED.   diclofenac  Sodium 1 % Gel Commonly known as: VOLTAREN  APPLY FOUR grams TO affected AREAS FOUR TIMES DAILY AS NEEDED   fexofenadine -pseudoephedrine 60-120 MG 12 hr tablet Commonly known as: ALLEGRA-D Take 1 tablet by mouth 2 (two) times daily.   Fluad 0.5 ML injection Generic drug: influenza vaccine adjuvanted   fluticasone  50 MCG/ACT nasal spray Commonly known as: FLONASE  Place 2  sprays into both nostrils daily.   furosemide  20 MG tablet Commonly known as: LASIX  Take 1 tablet (20 mg total) by mouth daily.   glipiZIDE  5 MG tablet Commonly known as: GLUCOTROL  Take 1 tablet (5 mg total) by mouth 2 (two) times daily before a meal.   glucose blood test strip Commonly known as: ONE TOUCH ULTRA TEST 1 each by Other route daily in the afternoon. CHECK BLOOD SUGAR 2 TIMES A DAY   icosapent  Ethyl 1 g capsule Commonly known as: Vascepa  Take 2 capsules (2 g total) by mouth 2 (two) times daily.   lidocaine -prilocaine  cream Commonly known as: EMLA  Apply 1 Application topically as needed.   meclizine  12.5 MG  tablet Commonly known as: ANTIVERT  Take 1 tablet (12.5 mg total) by mouth 3 (three) times daily as needed for dizziness.   metoprolol  tartrate 25 MG tablet Commonly known as: LOPRESSOR  Take 1 tablet (25 mg total) by mouth 2 (two) times daily.   mometasone  0.1 % cream Commonly known as: ELOCON  Apply topically daily.   mupirocin  ointment 2 % Commonly known as: BACTROBAN  SMARTSIG:sparingly Topical Daily   olmesartan  20 MG tablet Commonly known as: BENICAR  TAKE ONE TABLET EVERY DAY   onetouch ultrasoft lancets 1 each by Other route daily in the afternoon. Use as instructed   Praluent  150 MG/ML Soaj Generic drug: Alirocumab  Inject 1 Dose into the skin every 14 (fourteen) days.   pregabalin  75 MG capsule Commonly known as: LYRICA  Take 1 capsule (75 mg total) by mouth 3 (three) times daily.   pyridoxine  500 MG tablet Commonly known as: B-6 Take 1 tablet (500 mg total) by mouth daily.   Rybelsus  14 MG Tabs Generic drug: Semaglutide  Take 1 tablet (14 mg total) by mouth daily.   UNABLE TO FIND Specialty bra s/p breast cancer surgery   Vitamin D  (Ergocalciferol ) 1.25 MG (50000 UNIT) Caps capsule Commonly known as: DRISDOL  TAKE ONE CAPSULE ONCE WEEKLY   Vitamin D3 250 MCG (10000 UT) Tabs Take by mouth 3 (three) times a week.        Allergies:  Allergies  Allergen Reactions   Zofran  [Ondansetron  Hcl] Other (See Comments)    Severe headache and abdominal pain   Cymbalta  [Duloxetine  Hcl] Other (See Comments)    Profuse sweating   Farxiga  [Dapagliflozin ] Other (See Comments)    Dizziness   Januvia  [Sitagliptin ] Swelling   Meloxicam  Nausea And Vomiting   Shellfish Allergy Other (See Comments)    Gout   Trulicity  [Dulaglutide ] Nausea Only   Diclofenac  Diarrhea   Lipitor [Atorvastatin] Other (See Comments)    Muscle aches   Livalo [Pitavastatin] Other (See Comments)        Sulfa Antibiotics Rash   Tape Other (See Comments)    Causes skin irritation   Zetia  [Ezetimibe] Other (See Comments)    Muscle aches   Zocor [Simvastatin] Other (See Comments)    Muscle aches     Past Medical History, Surgical history, Social history, and Family History were reviewed and updated.  Review of Systems: Review of Systems  Constitutional: Negative.   HENT:  Positive for sore throat.   Eyes: Negative.   Respiratory: Negative.    Cardiovascular: Negative.   Gastrointestinal: Negative.   Genitourinary: Negative.   Musculoskeletal: Negative.   Skin: Negative.   Neurological: Negative.   Endo/Heme/Allergies: Negative.   Psychiatric/Behavioral: Negative.       Physical Exam:  Vital signs show temperature of 98.1.  Pulse 67.  Blood pressure 137/58.  Weight is 178 pounds.  Wt Readings from Last 3 Encounters:  08/13/23 179 lb (81.2 kg)  08/11/23 177 lb 6.4 oz (80.5 kg)  05/25/23 182 lb (82.6 kg)    Physical Exam Vitals reviewed.  HENT:     Head: Normocephalic and atraumatic.  Eyes:     Pupils: Pupils are equal, round, and reactive to light.  Cardiovascular:     Rate and Rhythm: Normal rate and regular rhythm.     Heart sounds: Normal heart sounds.  Pulmonary:     Effort: Pulmonary effort is normal.     Breath sounds: Normal breath sounds.  Abdominal:     General: Bowel sounds are normal.     Palpations: Abdomen is soft.  Musculoskeletal:        General: No tenderness or deformity. Normal range of motion.     Cervical back: Normal range of motion.  Lymphadenopathy:     Cervical: No cervical adenopathy.  Skin:    General: Skin is warm and dry.     Findings: No erythema or rash.  Neurological:     Mental Status: She is alert and oriented to person, place, and time.  Psychiatric:        Behavior: Behavior normal.        Thought Content: Thought content normal.        Judgment: Judgment normal.     Lab Results  Component Value Date   WBC 7.2 08/20/2023   HGB 13.1 08/20/2023   HCT 37.9 08/20/2023   MCV 92.0 08/20/2023   PLT 139  (L) 08/20/2023   Lab Results  Component Value Date   FERRITIN 56 04/16/2023   IRON 90 04/16/2023   TIBC 329 04/16/2023   UIBC 239 04/16/2023   IRONPCTSAT 27 04/16/2023   Lab Results  Component Value Date   RETICCTPCT 1.3 08/20/2023   RBC 4.12 08/20/2023   RBC 4.17 08/20/2023   No results found for: "KPAFRELGTCHN", "LAMBDASER", "KAPLAMBRATIO" No results found for: "IGGSERUM", "IGA", "IGMSERUM" No results found for: "TOTALPROTELP", "ALBUMINELP", "A1GS", "A2GS", "BETS", "BETA2SER", "GAMS", "MSPIKE", "SPEI"   Chemistry      Component Value Date/Time   NA 140 08/20/2023 1307   NA 139 05/25/2023 1309   NA 147 (H) 01/22/2017 1451   NA 140 12/31/2015 1052   K 3.9 08/20/2023 1307   K 4.9 (H) 01/22/2017 1451   K 4.2 12/31/2015 1052   CL 104 08/20/2023 1307   CL 106 01/22/2017 1451   CO2 28 08/20/2023 1307   CO2 29 01/22/2017 1451   CO2 23 12/31/2015 1052   BUN 20 08/20/2023 1307   BUN 12 05/25/2023 1309   BUN 23 (H) 01/22/2017 1451   BUN 21.4 12/31/2015 1052   CREATININE 1.30 (H) 08/20/2023 1307   CREATININE 1.6 (H) 01/22/2017 1451   CREATININE 1.4 (H) 12/31/2015 1052      Component Value Date/Time   CALCIUM  9.3 08/20/2023 1307   CALCIUM  10.3 01/22/2017 1451   CALCIUM  9.7 12/31/2015 1052   ALKPHOS 81 08/20/2023 1307   ALKPHOS 65 01/22/2017 1451   ALKPHOS 87 12/31/2015 1052   AST 17 08/20/2023 1307   AST 31 12/31/2015 1052   ALT 14 08/20/2023 1307   ALT 25 01/22/2017 1451   ALT 36 12/31/2015 1052   BILITOT 0.4 08/20/2023 1307   BILITOT 0.42 12/31/2015 1052       Impression and Plan: Ms. Spickler is a very pleasant 83 yo caucasian female with stage IIA infiltrating ductal carcinoma of  the left breast, triple negative.   She completed her adjuvant chemotherapy with A/C in April 2023.  I think everything looks okay with her breast cancer.  I do not see anything with the breast cancer that looks suspicious.  We will see what her iron levels look like.  We will get  her through the Summertime now.  I know that she still has her Port-A-Cath in.  We will make sure that this stays flushed every couple months.   5/30/20252:03 PM

## 2023-08-23 ENCOUNTER — Ambulatory Visit: Payer: Self-pay | Admitting: Hematology & Oncology

## 2023-08-23 LAB — IRON AND IRON BINDING CAPACITY (CC-WL,HP ONLY)
Iron: 110 ug/dL (ref 28–170)
Saturation Ratios: 38 % — ABNORMAL HIGH (ref 10.4–31.8)
TIBC: 291 ug/dL (ref 250–450)
UIBC: 181 ug/dL (ref 148–442)

## 2023-08-30 ENCOUNTER — Inpatient Hospital Stay: Attending: Hematology & Oncology

## 2023-08-30 DIAGNOSIS — Z853 Personal history of malignant neoplasm of breast: Secondary | ICD-10-CM | POA: Diagnosis not present

## 2023-08-30 MED ORDER — SODIUM CHLORIDE 0.9% FLUSH
10.0000 mL | Freq: Once | INTRAVENOUS | Status: AC
  Administered 2023-08-30: 10 mL via INTRAVENOUS

## 2023-08-30 MED ORDER — HEPARIN SOD (PORK) LOCK FLUSH 100 UNIT/ML IV SOLN
500.0000 [IU] | Freq: Once | INTRAVENOUS | Status: AC
  Administered 2023-08-30: 500 [IU] via INTRAVENOUS

## 2023-08-30 NOTE — Progress Notes (Signed)
 Carmen [name] presents today for phlebotomy per MD orders. Phlebotomy procedure started via PAC at 1304 and ended at 1324. 500 grams removed. Patient observed for 30 minutes after procedure without any incident. Patient tolerated procedure well. IV needle removed intact.

## 2023-09-06 DIAGNOSIS — C50512 Malignant neoplasm of lower-outer quadrant of left female breast: Secondary | ICD-10-CM | POA: Diagnosis not present

## 2023-09-07 DIAGNOSIS — C50512 Malignant neoplasm of lower-outer quadrant of left female breast: Secondary | ICD-10-CM | POA: Diagnosis not present

## 2023-09-13 ENCOUNTER — Other Ambulatory Visit: Payer: Self-pay | Admitting: Family Medicine

## 2023-09-20 ENCOUNTER — Inpatient Hospital Stay: Payer: Medicare Other

## 2023-09-28 DIAGNOSIS — E1142 Type 2 diabetes mellitus with diabetic polyneuropathy: Secondary | ICD-10-CM | POA: Diagnosis not present

## 2023-09-28 DIAGNOSIS — L84 Corns and callosities: Secondary | ICD-10-CM | POA: Diagnosis not present

## 2023-09-28 DIAGNOSIS — M79674 Pain in right toe(s): Secondary | ICD-10-CM | POA: Diagnosis not present

## 2023-09-28 DIAGNOSIS — B351 Tinea unguium: Secondary | ICD-10-CM | POA: Diagnosis not present

## 2023-09-28 DIAGNOSIS — M79675 Pain in left toe(s): Secondary | ICD-10-CM | POA: Diagnosis not present

## 2023-10-15 ENCOUNTER — Inpatient Hospital Stay: Attending: Hematology & Oncology

## 2023-10-15 VITALS — BP 125/54 | HR 73 | Temp 98.2°F | Resp 18

## 2023-10-15 DIAGNOSIS — Z853 Personal history of malignant neoplasm of breast: Secondary | ICD-10-CM | POA: Diagnosis not present

## 2023-10-15 DIAGNOSIS — Z95828 Presence of other vascular implants and grafts: Secondary | ICD-10-CM

## 2023-10-15 MED ORDER — HEPARIN SOD (PORK) LOCK FLUSH 100 UNIT/ML IV SOLN
500.0000 [IU] | Freq: Once | INTRAVENOUS | Status: AC
  Administered 2023-10-15: 500 [IU] via INTRAVENOUS

## 2023-10-15 MED ORDER — SODIUM CHLORIDE 0.9% FLUSH
10.0000 mL | Freq: Once | INTRAVENOUS | Status: AC
  Administered 2023-10-15: 10 mL via INTRAVENOUS

## 2023-10-15 NOTE — Patient Instructions (Signed)

## 2023-11-08 ENCOUNTER — Inpatient Hospital Stay: Payer: Medicare Other

## 2023-11-15 ENCOUNTER — Other Ambulatory Visit: Payer: Self-pay | Admitting: Family Medicine

## 2023-11-23 ENCOUNTER — Other Ambulatory Visit: Payer: Self-pay | Admitting: Family Medicine

## 2023-11-23 DIAGNOSIS — E1159 Type 2 diabetes mellitus with other circulatory complications: Secondary | ICD-10-CM

## 2023-11-23 DIAGNOSIS — Z951 Presence of aortocoronary bypass graft: Secondary | ICD-10-CM

## 2023-12-02 ENCOUNTER — Encounter: Payer: Self-pay | Admitting: Family Medicine

## 2023-12-02 ENCOUNTER — Telehealth: Payer: Self-pay

## 2023-12-02 ENCOUNTER — Ambulatory Visit: Admitting: Family Medicine

## 2023-12-02 VITALS — BP 128/70 | HR 75 | Temp 98.0°F | Ht 63.0 in | Wt 177.0 lb

## 2023-12-02 DIAGNOSIS — E1142 Type 2 diabetes mellitus with diabetic polyneuropathy: Secondary | ICD-10-CM | POA: Diagnosis not present

## 2023-12-02 DIAGNOSIS — E782 Mixed hyperlipidemia: Secondary | ICD-10-CM

## 2023-12-02 DIAGNOSIS — I152 Hypertension secondary to endocrine disorders: Secondary | ICD-10-CM

## 2023-12-02 DIAGNOSIS — R2689 Other abnormalities of gait and mobility: Secondary | ICD-10-CM | POA: Diagnosis not present

## 2023-12-02 DIAGNOSIS — E1159 Type 2 diabetes mellitus with other circulatory complications: Secondary | ICD-10-CM | POA: Diagnosis not present

## 2023-12-02 LAB — BAYER DCA HB A1C WAIVED: HB A1C (BAYER DCA - WAIVED): 6.3 % — ABNORMAL HIGH (ref 4.8–5.6)

## 2023-12-02 MED ORDER — GLIPIZIDE 5 MG PO TABS: ORAL_TABLET | ORAL | 1 refills | Status: DC

## 2023-12-02 NOTE — Telephone Encounter (Signed)
 Patient states that her BS have been running in the 200 rang for the last week. Would like to know if she should increase the Glipizide ?

## 2023-12-02 NOTE — Progress Notes (Signed)
 Subjective:  Patient ID: Carmen [name], female    DOB: Nov 21, 1940  Age: 83 y.o. MRN: [id number]  CC: Medical Management of Chronic Issues   HPI  Discussed the use of AI scribe software for clinical note transcription with the patient, who gave verbal consent to proceed.  History of Present Illness Carmen [name] is an 83 year old female with neuropathy who presents with balance issues and dizziness.  She has been experiencing balance issues and dizziness for the past 25 years, characterized by difficulty maintaining balance, especially when stopping quickly or turning around, leading to frequent falls against door frames. No vertigo is present, as she does not feel the room spinning. She uses a cane for stability.  She has a history of neuropathy, diagnosed following a nerve conduction test performed in [place] many years ago, which indicated nerve problems. She experiences burning sensations in her feet, managed with pregabalin  taken three times daily. Since undergoing chemotherapy, she has developed neuropathy in her fingers and hands, causing pain when checking her blood sugar.  Her diabetes management includes checking her blood sugar three times a week. Recently, she missed a dose of her diabetes medication, leading to elevated blood sugar levels above 200 mg/dL.  She mentions a past incident involving her brother, who passed away after a fall and subsequent strokes. She is also excited about an upcoming trip to [place]  to visit her granddaughter, whom she hasn't seen in over two years.          12/02/2023    1:44 PM 10/15/2023    1:08 PM 05/25/2023    1:09 PM  Depression screen PHQ 2/9  Decreased Interest 0 0 0  Down, Depressed, Hopeless 0 0 0  PHQ - 2 Score 0 0 0  Altered sleeping 0  0  Tired, decreased energy 2  2  Change in appetite 0  0  Feeling bad or failure about yourself  0  0  Trouble concentrating 0  0  Moving slowly or fidgety/restless 0  3   Suicidal thoughts 0  0  PHQ-9 Score 2  5  Difficult doing work/chores Not difficult at all  Not difficult at all    History Keyona has a past medical history of Arthritis, Asymptomatic PVCs, Bilateral shoulder pain, CAD (coronary artery disease), Cancer (HCC) (11/2015), Chronic renal insufficiency, stage 3 (moderate) (HCC), Diabetes mellitus without complication (HCC), Diverticulosis, Endometrial polyp, External hemorrhoids, Goals of care, counseling/discussion (02/07/2021), Gout, Hemochromatosis, hereditary (HCC) (11/24/2012), History of anemia, History of duodenal ulcer (1990), History of radiation therapy, Hyperlipidemia, Hypertension, Internal hemorrhoids, Jaundice, Myocardial infarction (HCC) (2007), Neuromuscular disorder (HCC), PMB (postmenopausal bleeding), PONV (postoperative nausea and vomiting), Prolapse of female pelvic organs, Seizures (HCC), Stage I breast cancer, left (HCC) (12/16/2020), Tick bite (08/12/2017), Type 2 diabetes mellitus with hyperglycemia, without long-term current use of insulin  (HCC) (01/27/2019), and Vitamin D  deficiency.   She has a past surgical history that includes Lipoma excision; Breast surgery; Cardiac catheterization (06/03/2005); Colonoscopy (11/28/2001); Upper gi endoscopy (01/20/1989); Coronary artery bypass graft (2007); Dilatation & curettage/hysteroscopy with myosure (N/A, 09/28/2017); Breast lumpectomy with radioactive seed and sentinel lymph node biopsy (Left, 01/14/2021); Portacath placement (Right, 01/14/2021); and Lipoma excision (Left, 01/14/2021).   Her family history includes Arthritis in her sister, sister, and sister; Cancer (age of onset: 66) in her sister; Diabetes in her brother, brother, brother, and father; Diverticulitis in her sister; Hemachromatosis in her brother, sister, sister, and sister; Hypertension in her father and mother; Liver cancer (  age of onset: 2) in her maternal grandmother; Lung cancer in her brother; Lupus in her sister;  Melanoma (age of onset: 34) in her daughter; Neuropathy in her mother; Stroke in her father and mother.She reports that she has never smoked. She has never used smokeless tobacco. She reports current alcohol use. She reports that she does not use drugs.    ROS Review of Systems  Constitutional: Negative.   HENT: Negative.    Eyes:  Negative for visual disturbance.  Respiratory:  Negative for shortness of breath.   Cardiovascular:  Negative for chest pain.  Gastrointestinal:  Negative for abdominal pain.  Musculoskeletal:  Negative for arthralgias.  Neurological:  Positive for dizziness.    Objective:  BP 128/70   Pulse 75   Temp 98 F (36.7 C)   Ht 5' 3 (1.6 m)   Wt 177 lb (80.3 kg)   SpO2 96%   BMI 31.35 kg/m   BP Readings from Last 3 Encounters:  12/02/23 128/70  10/15/23 (!) 125/54  08/30/23 (!) 121/54    Wt Readings from Last 3 Encounters:  12/02/23 177 lb (80.3 kg)  08/20/23 178 lb (80.7 kg)  08/13/23 179 lb (81.2 kg)     Physical Exam Constitutional:      General: She is not in acute distress.    Appearance: She is well-developed.  HENT:     Head: Normocephalic and atraumatic.  Eyes:     Conjunctiva/sclera: Conjunctivae normal.     Pupils: Pupils are equal, round, and reactive to light.  Neck:     Thyroid : No thyromegaly.  Cardiovascular:     Rate and Rhythm: Normal rate and regular rhythm.     Heart sounds: Normal heart sounds. No murmur heard. Pulmonary:     Effort: Pulmonary effort is normal. No respiratory distress.     Breath sounds: Normal breath sounds. No wheezing or rales.  Abdominal:     General: Bowel sounds are normal. There is no distension.     Palpations: Abdomen is soft.     Tenderness: There is no abdominal tenderness.  Musculoskeletal:        General: Normal range of motion.     Cervical back: Normal range of motion and neck supple.  Lymphadenopathy:     Cervical: No cervical adenopathy.  Skin:    General: Skin is warm and  dry.  Neurological:     Mental Status: She is alert and oriented to person, place, and time.  Psychiatric:        Behavior: Behavior normal.        Thought Content: Thought content normal.        Judgment: Judgment normal.      Assessment & Plan:  Poor balance  Type 2 diabetes mellitus with diabetic polyneuropathy, without long-term current use of insulin  (HCC) -     Bayer DCA Hb A1c Waived -     CMP14+EGFR  Mixed hyperlipidemia -     Lipid panel  Hypertension associated with diabetes (HCC) -     CBC with Differential/Platelet    Assessment and Plan Assessment & Plan Type 2 diabetes mellitus with diabetic polyneuropathy   She is experiencing elevated blood glucose levels due to a recent lapse in medication adherence. Neuropathy symptoms include a burning sensation in her feet and a new onset in her hands post-chemotherapy. Pregabalin  has reduced the burning in her feet. Increase the morning dose of diabetes medication to 10 mg as advised by the endocrinologist. Continue the  current regimen of pregabalin  for neuropathy. Monitor blood glucose levels three times a week and ensure adherence to the diabetes medication schedule.  Balance impairment   She has had chronic balance impairment for approximately 25 years, with no vertigo or ear-related issues identified. Neuropathy may contribute to her balance issues. A neurologist referral was discussed but deemed unnecessary unless additional symptoms develop. Consider using a walker for improved stability and fall prevention.       Follow-up: Return in about 6 months (around 05/31/2024).  Butler Der, M.D.

## 2023-12-02 NOTE — Telephone Encounter (Signed)
 Patient notified and will make medication changes.

## 2023-12-03 LAB — LIPID PANEL
Chol/HDL Ratio: 3.6 ratio (ref 0.0–4.4)
Cholesterol, Total: 136 mg/dL (ref 100–199)
HDL: 38 mg/dL — ABNORMAL LOW (ref >39)
LDL Chol Calc (NIH): 69 mg/dL (ref 0–99)
Triglycerides: 172 mg/dL — ABNORMAL HIGH (ref 0–149)
VLDL Cholesterol Cal: 29 mg/dL (ref 5–40)

## 2023-12-03 LAB — CMP14+EGFR
ALT: 17 IU/L (ref 0–32)
AST: 22 IU/L (ref 0–40)
Albumin: 4.2 g/dL (ref 3.7–4.7)
Alkaline Phosphatase: 96 IU/L (ref 44–121)
BUN/Creatinine Ratio: 15 (ref 12–28)
BUN: 18 mg/dL (ref 8–27)
Bilirubin Total: 0.6 mg/dL (ref 0.0–1.2)
CO2: 21 mmol/L (ref 20–29)
Calcium: 9.7 mg/dL (ref 8.7–10.3)
Chloride: 104 mmol/L (ref 96–106)
Creatinine, Ser: 1.2 mg/dL — ABNORMAL HIGH (ref 0.57–1.00)
Globulin, Total: 2.4 g/dL (ref 1.5–4.5)
Glucose: 137 mg/dL — ABNORMAL HIGH (ref 70–99)
Potassium: 4.4 mmol/L (ref 3.5–5.2)
Sodium: 141 mmol/L (ref 134–144)
Total Protein: 6.6 g/dL (ref 6.0–8.5)
eGFR: 45 mL/min/1.73 — ABNORMAL LOW (ref >59)

## 2023-12-03 LAB — CBC WITH DIFFERENTIAL/PLATELET
Basophils Absolute: 0 x10E3/uL (ref 0.0–0.2)
Basos: 0 %
EOS (ABSOLUTE): 0.2 x10E3/uL (ref 0.0–0.4)
Eos: 2 %
Hematocrit: 41.9 % (ref 34.0–46.6)
Hemoglobin: 14 g/dL (ref 11.1–15.9)
Immature Grans (Abs): 0 x10E3/uL (ref 0.0–0.1)
Immature Granulocytes: 0 %
Lymphocytes Absolute: 1 x10E3/uL (ref 0.7–3.1)
Lymphs: 14 %
MCH: 32.1 pg (ref 26.6–33.0)
MCHC: 33.4 g/dL (ref 31.5–35.7)
MCV: 96 fL (ref 79–97)
Monocytes Absolute: 0.4 x10E3/uL (ref 0.1–0.9)
Monocytes: 6 %
Neutrophils Absolute: 5.4 x10E3/uL (ref 1.4–7.0)
Neutrophils: 78 %
Platelets: 120 x10E3/uL — ABNORMAL LOW (ref 150–450)
RBC: 4.36 x10E6/uL (ref 3.77–5.28)
RDW: 14 % (ref 11.7–15.4)
WBC: 7.1 x10E3/uL (ref 3.4–10.8)

## 2023-12-06 ENCOUNTER — Ambulatory Visit: Payer: Self-pay | Admitting: Family Medicine

## 2023-12-06 NOTE — Progress Notes (Signed)
Hello Velicia,  Your lab result is normal and/or stable.Some minor variations that are not significant are commonly marked abnormal, but do not represent any medical problem for you.  Best regards, Claretta Fraise, M.D.

## 2023-12-10 ENCOUNTER — Ambulatory Visit: Admitting: Hematology & Oncology

## 2023-12-10 ENCOUNTER — Other Ambulatory Visit

## 2023-12-17 ENCOUNTER — Ambulatory Visit: Admitting: Hematology & Oncology

## 2023-12-17 ENCOUNTER — Other Ambulatory Visit

## 2023-12-17 ENCOUNTER — Inpatient Hospital Stay

## 2023-12-18 ENCOUNTER — Other Ambulatory Visit: Payer: Self-pay | Admitting: Family Medicine

## 2023-12-18 DIAGNOSIS — E1140 Type 2 diabetes mellitus with diabetic neuropathy, unspecified: Secondary | ICD-10-CM

## 2023-12-21 DIAGNOSIS — E1142 Type 2 diabetes mellitus with diabetic polyneuropathy: Secondary | ICD-10-CM | POA: Diagnosis not present

## 2023-12-21 DIAGNOSIS — B351 Tinea unguium: Secondary | ICD-10-CM | POA: Diagnosis not present

## 2023-12-21 DIAGNOSIS — M79674 Pain in right toe(s): Secondary | ICD-10-CM | POA: Diagnosis not present

## 2023-12-21 DIAGNOSIS — M79675 Pain in left toe(s): Secondary | ICD-10-CM | POA: Diagnosis not present

## 2023-12-21 DIAGNOSIS — L84 Corns and callosities: Secondary | ICD-10-CM | POA: Diagnosis not present

## 2023-12-22 ENCOUNTER — Inpatient Hospital Stay: Attending: Hematology & Oncology

## 2023-12-22 ENCOUNTER — Inpatient Hospital Stay (HOSPITAL_BASED_OUTPATIENT_CLINIC_OR_DEPARTMENT_OTHER): Admitting: Hematology & Oncology

## 2023-12-22 ENCOUNTER — Encounter: Payer: Self-pay | Admitting: Hematology & Oncology

## 2023-12-22 ENCOUNTER — Inpatient Hospital Stay

## 2023-12-22 VITALS — BP 140/62 | HR 71 | Temp 98.4°F | Resp 20 | Ht 63.0 in | Wt 178.0 lb

## 2023-12-22 DIAGNOSIS — C50912 Malignant neoplasm of unspecified site of left female breast: Secondary | ICD-10-CM

## 2023-12-22 DIAGNOSIS — Z853 Personal history of malignant neoplasm of breast: Secondary | ICD-10-CM | POA: Diagnosis not present

## 2023-12-22 DIAGNOSIS — Z9221 Personal history of antineoplastic chemotherapy: Secondary | ICD-10-CM | POA: Insufficient documentation

## 2023-12-22 LAB — CBC WITH DIFFERENTIAL (CANCER CENTER ONLY)
Abs Immature Granulocytes: 0.04 K/uL (ref 0.00–0.07)
Basophils Absolute: 0 K/uL (ref 0.0–0.1)
Basophils Relative: 1 %
Eosinophils Absolute: 0.3 K/uL (ref 0.0–0.5)
Eosinophils Relative: 3 %
HCT: 40.6 % (ref 36.0–46.0)
Hemoglobin: 13.8 g/dL (ref 12.0–15.0)
Immature Granulocytes: 1 %
Lymphocytes Relative: 26 %
Lymphs Abs: 2 K/uL (ref 0.7–4.0)
MCH: 31.5 pg (ref 26.0–34.0)
MCHC: 34 g/dL (ref 30.0–36.0)
MCV: 92.7 fL (ref 80.0–100.0)
Monocytes Absolute: 0.5 K/uL (ref 0.1–1.0)
Monocytes Relative: 7 %
Neutro Abs: 4.8 K/uL (ref 1.7–7.7)
Neutrophils Relative %: 62 %
Platelet Count: 123 K/uL — ABNORMAL LOW (ref 150–400)
RBC: 4.38 MIL/uL (ref 3.87–5.11)
RDW: 14.6 % (ref 11.5–15.5)
WBC Count: 7.6 K/uL (ref 4.0–10.5)
nRBC: 0 % (ref 0.0–0.2)

## 2023-12-22 LAB — CMP (CANCER CENTER ONLY)
ALT: 17 U/L (ref 0–44)
AST: 25 U/L (ref 15–41)
Albumin: 4.2 g/dL (ref 3.5–5.0)
Alkaline Phosphatase: 91 U/L (ref 38–126)
Anion gap: 11 (ref 5–15)
BUN: 21 mg/dL (ref 8–23)
CO2: 28 mmol/L (ref 22–32)
Calcium: 9.5 mg/dL (ref 8.9–10.3)
Chloride: 101 mmol/L (ref 98–111)
Creatinine: 1.14 mg/dL — ABNORMAL HIGH (ref 0.44–1.00)
GFR, Estimated: 48 mL/min — ABNORMAL LOW (ref >60)
Glucose, Bld: 158 mg/dL — ABNORMAL HIGH (ref 70–99)
Potassium: 4.2 mmol/L (ref 3.5–5.1)
Sodium: 139 mmol/L (ref 135–145)
Total Bilirubin: 0.5 mg/dL (ref 0.0–1.2)
Total Protein: 6.8 g/dL (ref 6.5–8.1)

## 2023-12-22 LAB — IRON AND IRON BINDING CAPACITY (CC-WL,HP ONLY)
Iron: 128 ug/dL (ref 28–170)
Saturation Ratios: 38 % — ABNORMAL HIGH (ref 10.4–31.8)
TIBC: 335 ug/dL (ref 250–450)
UIBC: 207 ug/dL

## 2023-12-22 LAB — FERRITIN: Ferritin: 72 ng/mL (ref 11–307)

## 2023-12-22 NOTE — Patient Instructions (Signed)

## 2023-12-22 NOTE — Progress Notes (Signed)
 Hematology and Oncology Follow Up Visit  BROCK MOKRY [id number] 1940-09-07 83 y.o. 12/22/2023   Principle Diagnosis:  Invasive ductal carcinoma of the left breast-stage IIA (T2N0M0) -- TRIPLE NEGATIVE -- s/p LEFT lobectomy on 01/14/2021 Hemochromatosis (double heterozygote for C282Y and S65C mutations).   Current Therapy:  Phlebotomy to maintain ferritin less than 100 Adriamycin /Cytoxan  - started adjuvant therapy on 02/27/2021, s/p cycle #6/6 -completed on 06/30/2021 XRT to the LEFT breast -- started on 08/06/2021 -- completed 09/23/2021   Interim History:  Ms. Lubinski is here today for follow-up.  We last saw her back in May.  Since then, she has been quite busy.  She actually just got back from [place] .  She had a granddaughter who lives out there.  She went out with a son and his wife.  They had a wonderful time out there.  She feels good.  She is due for mammogram soon.  So far, we have had no problems with the hemochromatosis.  When I saw her, her iron saturation was 38%.  I think she may have been phlebotomized.  She has had no problems with expectedly breast cancer.  She has had no issues with nausea or vomiting.  She has had no change in bowel or bladder habits.  She has had no rashes.  There is been no leg swelling.  Overall, I would have to set her performance status is probably ECOG 1.    Medications:  Allergies as of 12/22/2023       Reactions   Zofran  [ondansetron  Hcl] Other (See Comments)   Severe headache and abdominal pain   Cymbalta  [duloxetine  Hcl] Other (See Comments)   Profuse sweating   Farxiga  [dapagliflozin ] Other (See Comments)   Dizziness   Januvia  [sitagliptin ] Swelling   Meloxicam  Nausea And Vomiting   Shellfish Allergy Other (See Comments)   Gout   Trulicity  [dulaglutide ] Nausea Only   Celecoxib Other (See Comments)   Diclofenac  Diarrhea   Lipitor [atorvastatin] Other (See Comments)   Muscle aches   Livalo [pitavastatin] Other (See  Comments)      Sulfa Antibiotics Rash   Tape Other (See Comments)   Causes skin irritation   Zetia [ezetimibe] Other (See Comments)   Muscle aches   Zocor [simvastatin] Other (See Comments)   Muscle aches         Medication List        Accurate as of December 22, 2023  1:59 PM. If you have any questions, ask your nurse or doctor.          STOP taking these medications    Fluad 0.5 ML injection Generic drug: influenza vaccine adjuvanted Stopped by: Fatmata Legere R Sakoya Win       TAKE these medications    Accu-Chek [employer] Lancet Kit Use to check Blood Sugars   allopurinol  100 MG tablet Commonly known as: ZYLOPRIM  TAKE ONE TABLET TWICE DAILY   aspirin 81 MG tablet Take 81 mg by mouth daily.   colchicine  0.6 MG tablet TAKE (1) TABLET DAILY AS NEEDED.   diclofenac  Sodium 1 % Gel Commonly known as: VOLTAREN  APPLY FOUR grams TO affected AREAS FOUR TIMES DAILY AS NEEDED   fluticasone  50 MCG/ACT nasal spray Commonly known as: FLONASE  Place 2 sprays into both nostrils daily.   furosemide  20 MG tablet Commonly known as: LASIX  TAKE ONE TABLET ONCE DAILY   glipiZIDE  5 MG tablet Commonly known as: GLUCOTROL  Take 2 tablets (10 mg total) by mouth daily before breakfast AND 1 tablet (5  mg total) daily before supper.   glucose blood test strip Commonly known as: ONE TOUCH ULTRA TEST 1 each by Other route daily in the afternoon. CHECK BLOOD SUGAR 2 TIMES A DAY   icosapent  Ethyl 1 g capsule Commonly known as: Vascepa  Take 2 capsules (2 g total) by mouth 2 (two) times daily.   lidocaine -prilocaine  cream Commonly known as: EMLA  Apply 1 Application topically as needed.   Lovaza  1 g capsule Generic drug: omega-3 acid ethyl esters 2 capsules Orally Twice a day   meclizine  12.5 MG tablet Commonly known as: ANTIVERT  Take 1 tablet (12.5 mg total) by mouth 3 (three) times daily as needed for dizziness.   metoprolol  tartrate 25 MG tablet Commonly known as: LOPRESSOR  TAKE  ONE TABLET TWICE DAILY   mometasone  0.1 % cream Commonly known as: ELOCON  Apply topically daily.   mupirocin  ointment 2 % Commonly known as: BACTROBAN  SMARTSIG:sparingly Topical Daily   olmesartan  20 MG tablet Commonly known as: BENICAR  TAKE ONE TABLET ONCE DAILY   onetouch ultrasoft lancets 1 each by Other route daily in the afternoon. Use as instructed   Praluent  150 MG/ML Soaj Generic drug: Alirocumab  Inject 1 Dose into the skin every 14 (fourteen) days.   pregabalin  75 MG capsule Commonly known as: LYRICA  TAKE ONE CAPSULE THREE TIMES DAILY   pyridoxine  500 MG tablet Commonly known as: B-6 Take 1 tablet (500 mg total) by mouth daily.   Rybelsus  14 MG Tabs Generic drug: Semaglutide  Take 1 tablet (14 mg total) by mouth daily.   triamterene -hydrochlorothiazide 37.5-25 MG capsule Commonly known as: DYAZIDE 1 capsule in the morning Orally Once a day   UNABLE TO FIND Specialty bra s/p breast cancer surgery   Vitamin D  (Ergocalciferol ) 1.25 MG (50000 UNIT) Caps capsule Commonly known as: DRISDOL  TAKE ONE CAPSULE ONCE WEEKLY        Allergies:  Allergies  Allergen Reactions   Zofran  [Ondansetron  Hcl] Other (See Comments)    Severe headache and abdominal pain   Cymbalta  [Duloxetine  Hcl] Other (See Comments)    Profuse sweating   Farxiga  [Dapagliflozin ] Other (See Comments)    Dizziness   Januvia  [Sitagliptin ] Swelling   Meloxicam  Nausea And Vomiting   Shellfish Allergy Other (See Comments)    Gout   Trulicity  [Dulaglutide ] Nausea Only   Celecoxib Other (See Comments)   Diclofenac  Diarrhea   Lipitor [Atorvastatin] Other (See Comments)    Muscle aches   Livalo [Pitavastatin] Other (See Comments)        Sulfa Antibiotics Rash   Tape Other (See Comments)    Causes skin irritation   Zetia [Ezetimibe] Other (See Comments)    Muscle aches   Zocor [Simvastatin] Other (See Comments)    Muscle aches     Past Medical History, Surgical history, Social  history, and Family History were reviewed and updated.  Review of Systems: Review of Systems  Constitutional: Negative.   HENT:  Positive for sore throat.   Eyes: Negative.   Respiratory: Negative.    Cardiovascular: Negative.   Gastrointestinal: Negative.   Genitourinary: Negative.   Musculoskeletal: Negative.   Skin: Negative.   Neurological: Negative.   Endo/Heme/Allergies: Negative.   Psychiatric/Behavioral: Negative.       Physical Exam:  Vital signs show temperature of 98.4.  Pulse 71.  Blood pressure 151/62.  Weight is 178 pounds.    Wt Readings from Last 3 Encounters:  12/22/23 178 lb (80.7 kg)  12/02/23 177 lb (80.3 kg)  08/20/23 178 lb (80.7 kg)  Physical Exam Vitals reviewed.  HENT:     Head: Normocephalic and atraumatic.  Eyes:     Pupils: Pupils are equal, round, and reactive to light.  Cardiovascular:     Rate and Rhythm: Normal rate and regular rhythm.     Heart sounds: Normal heart sounds.  Pulmonary:     Effort: Pulmonary effort is normal.     Breath sounds: Normal breath sounds.  Abdominal:     General: Bowel sounds are normal.     Palpations: Abdomen is soft.  Musculoskeletal:        General: No tenderness or deformity. Normal range of motion.     Cervical back: Normal range of motion.  Lymphadenopathy:     Cervical: No cervical adenopathy.  Skin:    General: Skin is warm and dry.     Findings: No erythema or rash.  Neurological:     Mental Status: She is alert and oriented to person, place, and time.  Psychiatric:        Behavior: Behavior normal.        Thought Content: Thought content normal.        Judgment: Judgment normal.      Lab Results  Component Value Date   WBC 7.1 12/02/2023   HGB 14.0 12/02/2023   HCT 41.9 12/02/2023   MCV 96 12/02/2023   PLT 120 (L) 12/02/2023   Lab Results  Component Value Date   FERRITIN 131 08/20/2023   IRON 110 08/20/2023   TIBC 291 08/20/2023   UIBC 181 08/20/2023   IRONPCTSAT 38 (H)  08/20/2023   Lab Results  Component Value Date   RETICCTPCT 1.3 08/20/2023   RBC 4.36 12/02/2023   No results found for: KPAFRELGTCHN, LAMBDASER, KAPLAMBRATIO No results found for: KIMBERLY LE, IGMSERUM No results found for: STEPHANY CARLOTA BENSON MARKEL EARLA JOANNIE DOC VICK, SPEI   Chemistry      Component Value Date/Time   NA 141 12/02/2023 1333   NA 147 (H) 01/22/2017 1451   NA 140 12/31/2015 1052   K 4.4 12/02/2023 1333   K 4.9 (H) 01/22/2017 1451   K 4.2 12/31/2015 1052   CL 104 12/02/2023 1333   CL 106 01/22/2017 1451   CO2 21 12/02/2023 1333   CO2 29 01/22/2017 1451   CO2 23 12/31/2015 1052   BUN 18 12/02/2023 1333   BUN 23 (H) 01/22/2017 1451   BUN 21.4 12/31/2015 1052   CREATININE 1.20 (H) 12/02/2023 1333   CREATININE 1.30 (H) 08/20/2023 1307   CREATININE 1.6 (H) 01/22/2017 1451   CREATININE 1.4 (H) 12/31/2015 1052      Component Value Date/Time   CALCIUM  9.7 12/02/2023 1333   CALCIUM  10.3 01/22/2017 1451   CALCIUM  9.7 12/31/2015 1052   ALKPHOS 96 12/02/2023 1333   ALKPHOS 65 01/22/2017 1451   ALKPHOS 87 12/31/2015 1052   AST 22 12/02/2023 1333   AST 17 08/20/2023 1307   AST 31 12/31/2015 1052   ALT 17 12/02/2023 1333   ALT 14 08/20/2023 1307   ALT 25 01/22/2017 1451   ALT 36 12/31/2015 1052   BILITOT 0.6 12/02/2023 1333   BILITOT 0.4 08/20/2023 1307   BILITOT 0.42 12/31/2015 1052       Impression and Plan: Ms. Barnwell is a very pleasant 83 yo caucasian female with stage IIA infiltrating ductal carcinoma of the left breast, triple negative.   She completed her adjuvant chemotherapy with A/C in April 2023.  So far, I have seen no  evidence of breast cancer being a problem for her.  Hopefully, this will not be an issue.  I know she has triple negative breast cancer.  He has now been a year and a half that she completed her adjuvant therapy..  We are watching the hemochromatosis.  We will see what her iron  studies look like.  We will plan to get her through the Holiday season now.  I will plan to get her back probably in the Winter 2026.   10/1/20251:59 PM

## 2023-12-23 ENCOUNTER — Ambulatory Visit: Payer: Self-pay | Admitting: Hematology & Oncology

## 2023-12-23 LAB — CANCER ANTIGEN 27.29: CA 27.29: 54.3 U/mL — ABNORMAL HIGH (ref 0.0–38.6)

## 2023-12-24 ENCOUNTER — Telehealth: Payer: Self-pay

## 2023-12-24 NOTE — Telephone Encounter (Signed)
 Received phone call from patient inquiring about her CA27.29 result. Dr. Timmy stated that he was aware of the result and at this time working on a plan of action for result.  Pt educated that she does need to keep her mammogram scheduled on 01/03/2024 with no changes. Pt stated that she is glad Dr. Timmy is aware and will await a plan. Pt agreeable to plan and has no further concerns. Pt aware of next appointment.

## 2023-12-29 ENCOUNTER — Ambulatory Visit (HOSPITAL_BASED_OUTPATIENT_CLINIC_OR_DEPARTMENT_OTHER)
Admission: RE | Admit: 2023-12-29 | Discharge: 2023-12-29 | Disposition: A | Discharge status: Home / Self Care | Source: Ambulatory Visit | Attending: Hematology & Oncology | Admitting: Hematology & Oncology

## 2023-12-29 ENCOUNTER — Inpatient Hospital Stay

## 2023-12-29 DIAGNOSIS — C50912 Malignant neoplasm of unspecified site of left female breast: Secondary | ICD-10-CM | POA: Diagnosis not present

## 2023-12-29 DIAGNOSIS — K5730 Diverticulosis of large intestine without perforation or abscess without bleeding: Secondary | ICD-10-CM | POA: Diagnosis not present

## 2023-12-29 DIAGNOSIS — K8020 Calculus of gallbladder without cholecystitis without obstruction: Secondary | ICD-10-CM | POA: Diagnosis not present

## 2023-12-29 DIAGNOSIS — Z853 Personal history of malignant neoplasm of breast: Secondary | ICD-10-CM | POA: Diagnosis not present

## 2023-12-29 DIAGNOSIS — J8410 Pulmonary fibrosis, unspecified: Secondary | ICD-10-CM | POA: Diagnosis not present

## 2023-12-29 MED ORDER — IOHEXOL 300 MG/ML  SOLN
100.0000 mL | Freq: Once | INTRAMUSCULAR | Status: AC | PRN
  Administered 2023-12-29: 100 mL via INTRAVENOUS

## 2023-12-29 NOTE — Patient Instructions (Signed)

## 2023-12-29 NOTE — Addendum Note (Signed)
 Addended by: TIMMY COY R on: 12/29/2023 02:20 PM   Modules accepted: Orders

## 2023-12-29 NOTE — Progress Notes (Signed)
 Carmen [name] presents today for phlebotomy per MD orders. Phlebotomy procedure started at 1415  and ended at 1455. 500 grams removed. Patient observed for 30 minutes after procedure without any incident. Patient tolerated procedure well. IV needle removed intact.

## 2024-01-01 ENCOUNTER — Ambulatory Visit: Payer: Self-pay | Admitting: Hematology & Oncology

## 2024-01-03 DIAGNOSIS — Z1231 Encounter for screening mammogram for malignant neoplasm of breast: Secondary | ICD-10-CM | POA: Diagnosis not present

## 2024-01-03 LAB — HM MAMMOGRAPHY

## 2024-01-07 ENCOUNTER — Encounter: Payer: Self-pay | Admitting: Hematology & Oncology

## 2024-01-11 NOTE — Progress Notes (Signed)
 Carmen [name]                                          MRN: [id number]   01/11/2024   The [facility] Quality Team Specialist reviewed this patient medical record for the purposes of chart review for care gap closure. The following were reviewed: chart review for care gap closure-kidney health evaluation for diabetes:eGFR  and uACR.    [facility] Quality Team

## 2024-01-13 DIAGNOSIS — L820 Inflamed seborrheic keratosis: Secondary | ICD-10-CM | POA: Diagnosis not present

## 2024-01-13 DIAGNOSIS — L814 Other melanin hyperpigmentation: Secondary | ICD-10-CM | POA: Diagnosis not present

## 2024-01-13 DIAGNOSIS — L821 Other seborrheic keratosis: Secondary | ICD-10-CM | POA: Diagnosis not present

## 2024-01-13 DIAGNOSIS — D2371 Other benign neoplasm of skin of right lower limb, including hip: Secondary | ICD-10-CM | POA: Diagnosis not present

## 2024-01-13 DIAGNOSIS — Z85820 Personal history of malignant melanoma of skin: Secondary | ICD-10-CM | POA: Diagnosis not present

## 2024-01-13 DIAGNOSIS — D225 Melanocytic nevi of trunk: Secondary | ICD-10-CM | POA: Diagnosis not present

## 2024-01-13 DIAGNOSIS — Z85828 Personal history of other malignant neoplasm of skin: Secondary | ICD-10-CM | POA: Diagnosis not present

## 2024-01-13 DIAGNOSIS — D2271 Melanocytic nevi of right lower limb, including hip: Secondary | ICD-10-CM | POA: Diagnosis not present

## 2024-01-13 DIAGNOSIS — L570 Actinic keratosis: Secondary | ICD-10-CM | POA: Diagnosis not present

## 2024-01-17 LAB — MICROALBUMIN / CREATININE URINE RATIO

## 2024-02-09 ENCOUNTER — Ambulatory Visit: Admitting: Internal Medicine

## 2024-02-09 ENCOUNTER — Encounter: Payer: Self-pay | Admitting: Internal Medicine

## 2024-02-09 VITALS — BP 120/78 | Ht 63.0 in | Wt 177.0 lb

## 2024-02-09 DIAGNOSIS — N1832 Chronic kidney disease, stage 3b: Secondary | ICD-10-CM

## 2024-02-09 DIAGNOSIS — E1142 Type 2 diabetes mellitus with diabetic polyneuropathy: Secondary | ICD-10-CM

## 2024-02-09 DIAGNOSIS — Z7984 Long term (current) use of oral hypoglycemic drugs: Secondary | ICD-10-CM

## 2024-02-09 DIAGNOSIS — E1122 Type 2 diabetes mellitus with diabetic chronic kidney disease: Secondary | ICD-10-CM

## 2024-02-09 LAB — POCT GLYCOSYLATED HEMOGLOBIN (HGB A1C): Hemoglobin A1C: 6 % — AB (ref 4.0–5.6)

## 2024-02-09 MED ORDER — RYBELSUS 14 MG PO TABS: 14.0000 mg | ORAL_TABLET | Freq: Every day | ORAL | 3 refills | Status: AC

## 2024-02-09 MED ORDER — GLIPIZIDE 5 MG PO TABS
ORAL_TABLET | ORAL | 3 refills | Status: AC
Start: 2024-02-09 — End: ?

## 2024-02-09 MED ORDER — GLIPIZIDE 5 MG PO TABS: 5.0000 mg | ORAL_TABLET | Freq: Two times a day (BID) | ORAL | 3 refills | Status: DC

## 2024-02-09 NOTE — Progress Notes (Signed)
 Name: Carmen [name]  Age/ Sex: 83 y.o., female   MRN/ DOB: [id number], Aug 13, 1940     PCP: Zollie Lowers, MD   Reason for Endocrinology Evaluation: Type 2 Diabetes Mellitus  Initial Endocrine Consultative Visit: 09/14/2018    Carmen [name] is a 83 y.o. female with a past medical history of HTN, PVC, neuromuscular disorder, hemochromatosis and CAD (S/P CABG) . The Carmen has followed with Endocrinology clinic since 09/14/2018 for consultative assistance with management of her diabetes.  DIABETIC HISTORY:  Carmen [name] was diagnosed with T2DM in 2016. Has tried oral glycemic agents in the past,Januvia - swelling , Trulicity  - nausea , metformin - elevated LFT's . Her hemoglobin A1c has ranged from 6.6% in 2016, peaking at 8.6% in 2020.  Farxiga  stopped 10/2018 due to vertigo   Rybelsus  started 2/21  SUBJECTIVE:   During the last visit (08/13/2023): A1c was 6.1%.     Today (02/09/2024): Carmen [name] is here for a follow up on diabetes management. She checks glucose twice weekly, due to painful neuropathy    She continues to follow-up with oncology for Hx  left breast cancer(  sx 12/2021), completed chemo 06/2021 and completed breast radiation July, 2023 She also follows with oncology for hemochromatosis, last phlebotomy 12/2023 She had a follow-up with her PCP for polyneuropathy.  She had new onset in her hands postchemotherapy.  Lyrica  reduce the burning in her feet.  She was also evaluated by unsteady gait which has been attributed to neuropathy.  She was offered a referral to neurology but was deemed unnecessary at that time.   She follows with cardiology for HTN, CAD and dyslipidemia No nausea or vomiting  Has noted worsening constipation   HOME DIABETES REGIMEN:  Glipizide  5 mg, 2 tabs before breakfast and 1 tab before supper Rybelsus  14 mg daily     METER DOWNLOAD SUMMARY: did not bring    DIABETIC COMPLICATIONS: Microvascular  complications:  CKD III, neuropathy  Denies: retinopathy  Last eye exam: Completed 12/2023   Macrovascular complications:  CAD (S/P CABG) Denies: PVD, CVA     HISTORY:  Past Medical History:  Past Medical History:  Diagnosis Date   Arthritis    Asymptomatic PVCs    Bilateral shoulder pain    CAD (coronary artery disease)    Cancer (HCC) 11/2015   [id number]  right upper arm   Chronic renal insufficiency, stage 3 (moderate)    Diabetes mellitus without complication (HCC)    Diverticulosis    Endometrial polyp    External hemorrhoids    Goals of care, counseling/discussion 02/07/2021   Gout    Hemochromatosis, hereditary 11/24/2012   History of anemia    History of duodenal ulcer 1990   History of radiation therapy    Left breast- 08/06/21-09/04/21- Dr. Lynwood Nasuti   Hyperlipidemia    Hypertension    Internal hemorrhoids    Jaundice    age 31 or 62   Myocardial infarction (HCC) 2007   Neuromuscular disorder (HCC)    peripheral neuropathy   PMB (postmenopausal bleeding)    PONV (postoperative nausea and vomiting)    Prolapse of female pelvic organs    uses pessary   Seizures (HCC)    had one at Dr. Jessy office after getting blood drawn   Stage I breast cancer, left (HCC) 12/16/2020   Tick bite 08/12/2017   had 3 bites   Type 2 diabetes mellitus with hyperglycemia, without long-term current use of insulin  (HCC)  01/27/2019   Vitamin D  deficiency    Past Surgical History:  Past Surgical History:  Procedure Laterality Date   BREAST LUMPECTOMY WITH RADIOACTIVE SEED AND SENTINEL LYMPH NODE BIOPSY Left 01/14/2021   Procedure: LEFT BREAST LUMPECTOMY WITH RADIOACTIVE SEED AND SENTINEL LYMPH NODE BIOPSY;  Surgeon: Curvin Deward MOULD, MD;  Location: [facility];  Service: General;  Laterality: Left;   BREAST SURGERY     left breast lump--benign   CARDIAC CATHETERIZATION  06/03/2005   COLONOSCOPY  11/28/2001   CORONARY ARTERY BYPASS GRAFT  2007   x2  Dr. Kerrin, LIMA to LAD, SVG to PDA   DILATATION & CURETTAGE/HYSTEROSCOPY WITH MYOSURE N/A 09/28/2017   Procedure: DILATATION & CURETTAGE/HYSTEROSCOPY WITH MYOSURE;  Surgeon: Johnnye Ade, MD;  Location[facility];  Service: Gynecology;  Laterality: N/A;   LIPOMA EXCISION     back   LIPOMA EXCISION Left 01/14/2021   Procedure: EXCISION LEFT CHEST WALL LIPOMA;  Surgeon: Curvin Deward MOULD, MD;  Location: [facility];  Service: General;  Laterality: Left;   PORTACATH PLACEMENT Right 01/14/2021   Procedure: INSERTION PORT-A-CATH;  Surgeon: Curvin Deward MOULD, MD;  Location: [facility];  Service: General;  Laterality: Right;   UPPER GI ENDOSCOPY  01/20/1989   Social History:  reports that she has never smoked. She has never used smokeless tobacco. She reports current alcohol use. She reports that she does not use drugs. Family History:  Family History  Problem Relation Age of Onset   Stroke Mother    Hypertension Mother    Neuropathy Mother    Stroke Father    Hypertension Father    Diabetes Father    Cancer Sister 40       sarcoma   Arthritis Sister    Arthritis Sister    Lupus Sister    Hemachromatosis Sister    Arthritis Sister    Hemachromatosis Sister    Hemachromatosis Sister    Diverticulitis Sister    Diabetes Brother    Diabetes Brother    Diabetes Brother    Hemachromatosis Brother    Lung cancer Brother    Liver cancer Maternal Grandmother 23   Melanoma Daughter 40     HOME MEDICATIONS: Allergies as of 02/09/2024       Reactions   Zofran  [ondansetron  Hcl] Other (See Comments)   Severe headache and abdominal pain   Cymbalta  [duloxetine  Hcl] Other (See Comments)   Profuse sweating   Farxiga  [dapagliflozin ] Other (See Comments)   Dizziness   Januvia  [sitagliptin ] Swelling   Meloxicam  Nausea And Vomiting   Shellfish Allergy Other (See Comments)   Gout   Trulicity  [dulaglutide ] Nausea Only   Celecoxib Other (See  Comments)   Diclofenac  Diarrhea   Lipitor [atorvastatin] Other (See Comments)   Muscle aches   Livalo [pitavastatin] Other (See Comments)      Sulfa Antibiotics Rash   Tape Other (See Comments)   Causes skin irritation   Zetia [ezetimibe] Other (See Comments)   Muscle aches   Zocor [simvastatin] Other (See Comments)   Muscle aches         Medication List        Accurate as of February 09, 2024 12:00 PM. If you have any questions, ask your nurse or doctor.          Accu-Chek [employer] Kit Use to check Blood Sugars   allopurinol  100 MG tablet Commonly known as: ZYLOPRIM  TAKE ONE TABLET TWICE DAILY  aspirin 81 MG tablet Take 81 mg by mouth daily.   colchicine  0.6 MG tablet TAKE (1) TABLET DAILY AS NEEDED.   diclofenac  Sodium 1 % Gel Commonly known as: VOLTAREN  APPLY FOUR grams TO affected AREAS FOUR TIMES DAILY AS NEEDED   fluticasone  50 MCG/ACT nasal spray Commonly known as: FLONASE  Place 2 sprays into both nostrils daily.   furosemide  20 MG tablet Commonly known as: LASIX  TAKE ONE TABLET ONCE DAILY   glipiZIDE  5 MG tablet Commonly known as: GLUCOTROL  Take 2 tablets (10 mg total) by mouth daily before breakfast AND 1 tablet (5 mg total) daily before supper.   glucose blood test strip Commonly known as: ONE TOUCH ULTRA TEST 1 each by Other route daily in the afternoon. CHECK BLOOD SUGAR 2 TIMES A DAY   icosapent  Ethyl 1 g capsule Commonly known as: Vascepa  Take 2 capsules (2 g total) by mouth 2 (two) times daily.   lidocaine -prilocaine  cream Commonly known as: EMLA  Apply 1 Application topically as needed.   Lovaza  1 g capsule Generic drug: omega-3 acid ethyl esters 2 capsules Orally Twice a day   meclizine  12.5 MG tablet Commonly known as: ANTIVERT  Take 1 tablet (12.5 mg total) by mouth 3 (three) times daily as needed for dizziness.   metoprolol  tartrate 25 MG tablet Commonly known as: LOPRESSOR  TAKE ONE TABLET TWICE DAILY    mometasone  0.1 % cream Commonly known as: ELOCON  Apply topically daily.   mupirocin  ointment 2 % Commonly known as: BACTROBAN  SMARTSIG:sparingly Topical Daily   olmesartan  20 MG tablet Commonly known as: BENICAR  TAKE ONE TABLET ONCE DAILY   onetouch ultrasoft lancets 1 each by Other route daily in the afternoon. Use as instructed   Praluent  150 MG/ML Soaj Generic drug: Alirocumab  Inject 1 Dose into the skin every 14 (fourteen) days.   pregabalin  75 MG capsule Commonly known as: LYRICA  TAKE ONE CAPSULE THREE TIMES DAILY   pyridoxine  500 MG tablet Commonly known as: B-6 Take 1 tablet (500 mg total) by mouth daily.   Rybelsus  14 MG Tabs Generic drug: Semaglutide  Take 1 tablet (14 mg total) by mouth daily.   triamterene -hydrochlorothiazide 37.5-25 MG capsule Commonly known as: DYAZIDE 1 capsule in the morning Orally Once a day   UNABLE TO FIND Specialty bra s/p breast cancer surgery   Vitamin D  (Ergocalciferol ) 1.25 MG (50000 UNIT) Caps capsule Commonly known as: DRISDOL  TAKE ONE CAPSULE ONCE WEEKLY         OBJECTIVE:   Vital Signs: There were no vitals taken for this visit.  Wt Readings from Last 3 Encounters:  12/22/23 178 lb (80.7 kg)  12/02/23 177 lb (80.3 kg)  08/20/23 178 lb (80.7 kg)     Exam: General: Pt appears well and is in NAD  Lungs: Clear with good BS bilat   Heart: RRR   Extremities: No  pretibial edema.  Neuro: MS is good with appropriate affect, pt is alert and Ox3     DM foot exam: 03/02/2023 per podiatry      DATA REVIEWED:  Lab Results  Component Value Date   HGBA1C 6.3 (H) 12/02/2023   HGBA1C 6.1 (A) 08/13/2023   HGBA1C 6.6 (H) 05/25/2023    Latest Reference Range & Units 12/22/23 13:47  Sodium 135 - 145 mmol/L 139  Potassium 3.5 - 5.1 mmol/L 4.2  Chloride 98 - 111 mmol/L 101  CO2 22 - 32 mmol/L 28  Glucose 70 - 99 mg/dL 841 (H)  BUN 8 - 23 mg/dL 21  Creatinine 0.44 - 1.00 mg/dL 8.85 (H)  Calcium  8.9 - 10.3 mg/dL  9.5  Anion gap 5 - 15  11  Alkaline Phosphatase 38 - 126 U/L 91  Albumin 3.5 - 5.0 g/dL 4.2  AST 15 - 41 U/L 25  ALT 0 - 44 U/L 17  Total Protein 6.5 - 8.1 g/dL 6.8  Total Bilirubin 0.0 - 1.2 mg/dL 0.5  GFR, Est Non African American >60 mL/min 48 (L)       ASSESSMENT / PLAN / RECOMMENDATIONS:   1) Type 2 Diabetes Mellitus, Optimally  controlled, With neuropathic and CKD III complications - Most recent A1c of 6.0 %. Goal A1c < 7.5 %.    - A1c is skewed due to phlebotomies for hemochromatosis -Carmen's BG range between 140-180s mg/DL - Freestyle libre not covered by insurance - No change   MEDICATIONS: - Continue  Glipizide  5 mg , 2 tab before  Breakfast and 1 tablet before  Supper  - Continue  Rybelsus  14 mg daily with breakfast    EDUCATION / INSTRUCTIONS: BG monitoring instructions: Carmen is instructed to check her blood sugars 3 times a week fasting . Call [facility] if: BG persistently < 70  I reviewed the Rule of 15 for the treatment of hypoglycemia in detail with the Carmen. Literature supplied.    F/U in 6 months     Signed electronically by: Stefano Redgie Butts, MD  [facility] Endocrinology  [facility] [address] [place], [place] [address] Phone: [phone] FAX: [phone]   CC: Zollie Lowers, MD [address] [place] [address] Phone: [phone]  Fax: [phone]  Return to Endocrinology clinic as below: Future Appointments  Date Time Provider Department Center  02/09/2024  1:20 PM Jorel Gravlin, Donell Redgie, MD [facility] None  02/11/2024  2:30 PM [facility] INJ NURSE [facility] None  02/16/2024  3:20 PM Hilty, Vinie BROCKS, MD [facility] H&V  03/24/2024  2:30 PM [facility] LAB [facility] None  03/24/2024  2:45 PM [facility] INJ NURSE [facility] None  03/24/2024  3:00 PM Timmy Maude SAUNDERS, MD [facility] None  04/27/2024  2:30 PM [facility] WELLNESS VISIT [facility] [phone]  06/01/2024  1:25 PM Zollie Lowers,  MD [facility] [phone]

## 2024-02-09 NOTE — Patient Instructions (Addendum)
-   Continue  Glipizide  5 mg, 2 tablets before breakfast and 1 tablet before supper   - Continue  Rybelsus  14 mg daily with Breakfast      - HOW TO TREAT LOW BLOOD SUGARS (Blood sugar LESS THAN 70 MG/DL) Please follow the RULE OF 15 for the treatment of hypoglycemia treatment (when your (blood sugars are less than 70 mg/dL)   STEP 1: Take 15 grams of carbohydrates when your blood sugar is low, which includes:  3-4 GLUCOSE TABS  OR 3-4 OZ OF JUICE OR REGULAR SODA OR ONE TUBE OF GLUCOSE GEL    STEP 2: RECHECK blood sugar in 15 MINUTES STEP 3: If your blood sugar is still low at the 15 minute recheck --> then, go back to STEP 1 and treat AGAIN with another 15 grams of carbohydrates.

## 2024-02-11 ENCOUNTER — Inpatient Hospital Stay: Attending: Hematology & Oncology

## 2024-02-11 DIAGNOSIS — Z853 Personal history of malignant neoplasm of breast: Secondary | ICD-10-CM | POA: Insufficient documentation

## 2024-02-11 NOTE — Patient Instructions (Signed)

## 2024-02-15 ENCOUNTER — Ambulatory Visit: Payer: Self-pay | Admitting: Family Medicine

## 2024-02-15 NOTE — Progress Notes (Signed)
Hello Carmen [name],  Your lab result is normal and/or stable.Some minor variations that are not significant are commonly marked abnormal, but do not represent any medical problem for you.  Best regards, Claretta Fraise, M.D.

## 2024-02-16 ENCOUNTER — Other Ambulatory Visit: Payer: Self-pay | Admitting: Family Medicine

## 2024-02-16 ENCOUNTER — Ambulatory Visit: Attending: Internal Medicine | Admitting: Internal Medicine

## 2024-02-16 ENCOUNTER — Encounter: Payer: Self-pay | Admitting: Internal Medicine

## 2024-02-16 VITALS — BP 116/74 | HR 71 | Ht 62.5 in | Wt 178.6 lb

## 2024-02-16 DIAGNOSIS — R0609 Other forms of dyspnea: Secondary | ICD-10-CM

## 2024-02-16 DIAGNOSIS — E1159 Type 2 diabetes mellitus with other circulatory complications: Secondary | ICD-10-CM

## 2024-02-16 DIAGNOSIS — Z951 Presence of aortocoronary bypass graft: Secondary | ICD-10-CM

## 2024-02-16 DIAGNOSIS — I152 Hypertension secondary to endocrine disorders: Secondary | ICD-10-CM

## 2024-02-16 DIAGNOSIS — M791 Myalgia, unspecified site: Secondary | ICD-10-CM | POA: Diagnosis not present

## 2024-02-16 DIAGNOSIS — I25810 Atherosclerosis of coronary artery bypass graft(s) without angina pectoris: Secondary | ICD-10-CM

## 2024-02-16 DIAGNOSIS — T466X5D Adverse effect of antihyperlipidemic and antiarteriosclerotic drugs, subsequent encounter: Secondary | ICD-10-CM

## 2024-02-16 DIAGNOSIS — R600 Localized edema: Secondary | ICD-10-CM

## 2024-02-16 MED ORDER — FUROSEMIDE 20 MG PO TABS: 40.0000 mg | ORAL_TABLET | Freq: Every day | ORAL | 1 refills | Status: DC

## 2024-02-16 NOTE — Patient Instructions (Addendum)
 Medication Instructions:  INCREASE furosemide  to 40mg  daily  *If you need a refill on your cardiac medications before your next appointment, please call your pharmacy*  Lab Work: Non-Fasting lab work next week - BMET and BNP   If you have labs (blood work) drawn today and your tests are completely normal, you will receive your results only by: MyChart Message (if you have MyChart) OR A paper copy in the mail If you have any lab test that is abnormal or we need to change your treatment, we will call you to review the results.  Testing/Procedures:  Your physician has requested that you have an echocardiogram. Echocardiography is a painless test that uses sound waves to create images of your heart. It provides your doctor with information about the size and shape of your heart and how well your heart's chambers and valves are working. This procedure takes approximately one hour. There are no restrictions for this procedure. Please do NOT wear cologne, perfume, aftershave, or lotions (deodorant is allowed). Please arrive 15 minutes prior to your appointment time.  Please note: We ask at that you not bring children with you during ultrasound (echo/ vascular) testing. Due to room size and safety concerns, children are not allowed in the ultrasound rooms during exams. Our front office staff cannot provide observation of children in our lobby area while testing is being conducted. An adult accompanying a patient to their appointment will only be allowed in the ultrasound room at the discretion of the ultrasound technician under special circumstances. We apologize for any inconvenience.   Follow-Up: At [facility], you and your health needs are our priority.  As part of our continuing mission to provide you with exceptional heart care, our providers are all part of one team.  This team includes your primary Cardiologist (physician) and Advanced Practice Providers or APPs (Physician  Assistants and Nurse Practitioners) who all work together to provide you with the care you need, when you need it.  Your next appointment:    2-3 months -- after echo testing -- with Dr. Mona or PA/NP  We recommend signing up for the patient portal called MyChart.  Sign up information is provided on this After Visit Summary.  MyChart is used to connect with patients for Virtual Visits (Telemedicine).  Patients are able to view lab/test results, encounter notes, upcoming appointments, etc.  Non-urgent messages can be sent to your provider as well.   To learn more about what you can do with MyChart, go to [email or web address].   Other Instructions

## 2024-02-16 NOTE — Progress Notes (Signed)
 OFFICE NOTE  Chief Complaint:  Follow-up, edema  Primary Care Physician: Zollie Lowers, MD  HPI:  Carmen [name] is a 83 year-old female comes in for followup of coronary disease. She had bypass surgery in 2007 and at that time had a 40% ejection fraction. She had a nuclear study in May of 2012 that was a low-risk study, but we could not calculate an ejection fraction because of ectopy. She has had no episodes of angina, no unusual shortness of breath unless she walks up a hill or a flight of steps, and then she becomes mildly breathless. She does not have PND or orthopnea. She does not have a productive cough. She has had no awareness of any arrhythmias, no dizziness, no syncope. She does have mild left ankle edema that is dependent in nature. She wears support hose infrequently for this. She has recurrent problems with gout, usually in her great toe. She has made some significant dietary changes, and the gout episodes seem to be less.  She has been diabetic for around 2 years. She has a hemoglobin A1c of 5.8, has had no significant episodes of hypoglycemia. She has noted some proximal lower extremity weakness. She gave an example working in her garden, when she tried to get up her legs were so weak that she had to get up on all fours, and one time her husband had to get her up. She has gone through physical therapy recently because of right hip bursitis, and it may very well be that she needs additional physical therapy for proximal muscle weakness and core strength building. No significant problems from the pollen. She has had some problems with constipation. Otherwise, no new complaints today.  Of note she's had a markedly abnormal lipid profile with particle numbers greater than 3000. She has been intolerant to all statins and has failed Crestor , Zetia, Zocor, Lipitor and Livalo in the past. I feel that she remains at very high risk of cardiac events and there is a great likelihood that she  may develop premature graft failure with her high cholesterol numbers. We did discuss the new PCSK9 inhibitors, for which she may be a great candidate if she is willing to give herself an injection every 2-4 weeks..  I saw Carmen [name] back in the office today. She is doing really well and denies any chest pain or worsening shortness of breath. She was referred for a trial of a study drug for PCS K9 inhibition. Unfortunately this was never arranged however subsequently to [facility] SK 9 inhibitors came out to the market. She was subsequently started on Praluent  by her primary care provider and is been using that for several months. Hopefully this will get her cholesterol down to goal.  11/12/2015  Carmen [name] returns today for follow-up. Over the past year she's done fairly well. She occasionally gets some swelling in her legs. She denies any chest pain or shortness of breath. We had started her on Praluent  at her last office visit due to statin intolerance. I again discussed the importance of using at least low-dose statin with the Praluent  foremost benefit, however she has had a significant reduction in her cholesterol with LDL that was greater than 819 now down to 82. Although she has not had any coronary event since her bypass, it is now been 10 years since her bypass surgery. She last had a stress test in 2012 which was negative for ischemia.  10/29/2016  Carmen [name] was seen today in follow-up. Unfortunately  she lost her husband at the beginning of this year to an acute hemorrhagic stroke. This is her second husband but they've been married for 10 years and she said that he was the love of her life. Overall she is doing well from a coronary standpoint. She denies any chest pain or worsening shortness of breath. Show stress test in November of last year which was negative for ischemia. She's had marked improvement in her cholesterol on Praluent , but her triglycerides remain elevated. Most of this is likely  related to elevated blood sugars but that is also being worked on. She is currently on Januvia .  11/09/2017  Carmen [name] returns for follow-up.  Recently she saw [facility] care wife for preoperative risk assessment for GYN surgery.  She had a uterine fibroid which was removed uneventfully.  She has marked dyslipidemia on Praluent  and has been intolerant to statins.  Recent lipid profile showed total cholesterol 188, triglycerides 358 (on Lovaza ), HDL 42 and LDL 74.  Overall reasonable lipid profile except for elevated triglycerides which percent residual risk.  We discussed the possibility of switching her over to Vascepa  when it obtains FDA approval in October.  Blood pressure is well controlled today.  She denies any chest pain or worsening shortness of breath.  Unfortunately her blood sugars been very poorly controlled, probably contributing to her elevated triglycerides.  04/27/2018  Carmen [name] is seen today in follow-up.  She has successfully been using Praluent  with some improvement in her lipid profile however most recently her total cholesterol is 187, triglycerides 466, HDL 37 and a direct LDL was not measured.  Presumably her LDL has come down further.  She continues to have persistently elevated triglycerides however which as previously mentioned may lead to persistently elevated cardiovascular risk.  Her only other complaint today is leg swelling.  She noted that she has had some persistently elevated edema despite being on triamterene  HCTZ.  She is wondering if there is something stronger for her.  06/10/2020  Carmen [name] is seen today in follow-up.  She continues to do well with Praluent  and Vascepa .  She has not had her lipids done since last summer however her LDL was low at 56.  Triglycerides remained elevated.  Recently she has been established with endocrine.  Her A1c is come down from 7.3-6.4.  She denies chest pain or shortness of breath.  She pointed out small mass about the size of a  walnut over the left lower rib to me today.  This did feel somewhat firm and most likely is a lipoma.  She says she has a history of that.  I have encouraged her to follow-up with her primary care provider regarding that.  03/01/2023  Ms. Huberty is seen today in follow-up.  She seems to be doing well.  She had recent repeat lipids showing total cholesterol 147, triglycerides 199, HDL 44 and LDL 70.  Hemoglobin A1c 6.1%.  She is primarily been undergoing therapy and follow-up with Dr. Timmy at the cancer center.  She is on low-dose aspirin.  She continues on Vascepa  and Praluent  75 mg every 2 weeks.  02/16/2024  Ms. Greening returns today for follow-up.  Recently she notes that she been having some worsening lower extremity edema.  She also has been short of breath.  Her shortness of breath is worse when walking up a few stairs.  She denies any chest pain.  EKG shows poor anterior lateral R wave progression suggestive anterior infarct and inferior infarct.  She does report 2 prior MIs.  She denies any recent chest pain symptoms.  Lipids recently showed total cholesterol 136, triglycerides 172, HDL 38 and LDL 64.  PMHx:  Past Medical History:  Diagnosis Date   Arthritis    Asymptomatic PVCs    Bilateral shoulder pain    CAD (coronary artery disease)    Cancer (HCC) 11/2015   melanomax4  right upper arm   Chronic renal insufficiency, stage 3 (moderate)    Diabetes mellitus without complication (HCC)    Diverticulosis    Endometrial polyp    External hemorrhoids    Goals of care, counseling/discussion 02/07/2021   Gout    Hemochromatosis, hereditary 11/24/2012   History of anemia    History of duodenal ulcer 1990   History of radiation therapy    Left breast- 08/06/21-09/04/21- Dr. Lynwood Nasuti   Hyperlipidemia    Hypertension    Internal hemorrhoids    Jaundice    age 40 or 50   Myocardial infarction (HCC) 2007   Neuromuscular disorder (HCC)    peripheral neuropathy   PMB  (postmenopausal bleeding)    PONV (postoperative nausea and vomiting)    Prolapse of female pelvic organs    uses pessary   Seizures (HCC)    had one at Dr. Jessy office after getting blood drawn   Stage I breast cancer, left (HCC) 12/16/2020   Tick bite 08/12/2017   had 3 bites   Type 2 diabetes mellitus with hyperglycemia, without long-term current use of insulin  (HCC) 01/27/2019   Vitamin D  deficiency     Past Surgical History:  Procedure Laterality Date   BREAST LUMPECTOMY WITH RADIOACTIVE SEED AND SENTINEL LYMPH NODE BIOPSY Left 01/14/2021   Procedure: LEFT BREAST LUMPECTOMY WITH RADIOACTIVE SEED AND SENTINEL LYMPH NODE BIOPSY;  Surgeon: Curvin Deward MOULD, MD;  Location: [facility];  Service: General;  Laterality: Left;   BREAST SURGERY     left breast lump--benign   CARDIAC CATHETERIZATION  06/03/2005   COLONOSCOPY  11/28/2001   CORONARY ARTERY BYPASS GRAFT  2007   x2 Dr. Kerrin, LIMA to LAD, SVG to PDA   DILATATION & CURETTAGE/HYSTEROSCOPY WITH MYOSURE N/A 09/28/2017   Procedure: DILATATION & CURETTAGE/HYSTEROSCOPY WITH MYOSURE;  Surgeon: Johnnye Ade, MD;  Location: [facility];  Service: Gynecology;  Laterality: N/A;   LIPOMA EXCISION     back   LIPOMA EXCISION Left 01/14/2021   Procedure: EXCISION LEFT CHEST WALL LIPOMA;  Surgeon: Curvin Deward MOULD, MD;  Location: [facility];  Service: General;  Laterality: Left;   PORTACATH PLACEMENT Right 01/14/2021   Procedure: INSERTION PORT-A-CATH;  Surgeon: Curvin Deward MOULD, MD;  Location: [facility];  Service: General;  Laterality: Right;   UPPER GI ENDOSCOPY  01/20/1989    FAMHx:  Family History  Problem Relation Age of Onset   Stroke Mother    Hypertension Mother    Neuropathy Mother    Stroke Father    Hypertension Father    Diabetes Father    Cancer Sister 48       sarcoma   Arthritis Sister    Arthritis Sister    Lupus Sister    Hemachromatosis Sister     Arthritis Sister    Hemachromatosis Sister    Hemachromatosis Sister    Diverticulitis Sister    Diabetes Brother    Diabetes Brother    Diabetes Brother    Hemachromatosis Brother    Lung cancer Brother  Liver cancer Maternal Grandmother 3   Melanoma Daughter 85    SOCHx:   reports that she has never smoked. She has never used smokeless tobacco. She reports current alcohol use. She reports that she does not use drugs.  ALLERGIES:  Allergies  Allergen Reactions   Zofran  [Ondansetron  Hcl] Other (See Comments)    Severe headache and abdominal pain   Cymbalta  [Duloxetine  Hcl] Other (See Comments)    Profuse sweating   Farxiga  [Dapagliflozin ] Other (See Comments)    Dizziness   Januvia  [Sitagliptin ] Swelling   Meloxicam  Nausea And Vomiting   Shellfish Allergy Other (See Comments)    Gout   Trulicity  [Dulaglutide ] Nausea Only   Celecoxib Other (See Comments)   Diclofenac  Diarrhea   Lipitor [Atorvastatin] Other (See Comments)    Muscle aches   Livalo [Pitavastatin] Other (See Comments)        Sulfa Antibiotics Rash   Tape Other (See Comments)    Causes skin irritation   Zetia [Ezetimibe] Other (See Comments)    Muscle aches   Zocor [Simvastatin] Other (See Comments)    Muscle aches     ROS: Pertinent items noted in HPI and remainder of comprehensive ROS otherwise negative.  HOME MEDS: Current Outpatient Medications  Medication Sig Dispense Refill   Alirocumab  (PRALUENT ) 150 MG/ML SOAJ Inject 1 Dose into the skin every 14 (fourteen) days. 2 mL 7   allopurinol  (ZYLOPRIM ) 100 MG tablet TAKE ONE TABLET TWICE DAILY 180 tablet 1   aspirin 81 MG tablet Take 81 mg by mouth daily.     colchicine  0.6 MG tablet TAKE (1) TABLET DAILY AS NEEDED. 30 tablet 1   diclofenac  Sodium (VOLTAREN ) 1 % GEL APPLY FOUR grams TO affected AREAS FOUR TIMES DAILY AS NEEDED 350 g 5   fluticasone  (FLONASE ) 50 MCG/ACT nasal spray Place 2 sprays into both nostrils daily. 16 g 6   furosemide   (LASIX ) 20 MG tablet TAKE ONE TABLET ONCE DAILY 90 tablet 0   glipiZIDE  (GLUCOTROL ) 5 MG tablet Take 2 tablets (10 mg total) by mouth daily before breakfast AND 1 tablet (5 mg total) daily before supper. 270 tablet 3   glucose blood (ONE TOUCH ULTRA TEST) test strip 1 each by Other route daily in the afternoon. CHECK BLOOD SUGAR 2 TIMES A DAY 100 each 3   icosapent  Ethyl (VASCEPA ) 1 g capsule Take 2 capsules (2 g total) by mouth 2 (two) times daily. 360 capsule 3   Lancets (ONETOUCH ULTRASOFT) lancets 1 each by Other route daily in the afternoon. Use as instructed 100 each 3   Lancets Misc. (ACCU-CHEK FASTCLIX LANCET) KIT Use to check Blood Sugars 1 kit 0   lidocaine -prilocaine  (EMLA ) cream Apply 1 Application topically as needed. 30 g 0   meclizine  (ANTIVERT ) 12.5 MG tablet Take 1 tablet (12.5 mg total) by mouth 3 (three) times daily as needed for dizziness. 30 tablet 5   metoprolol  tartrate (LOPRESSOR ) 25 MG tablet TAKE ONE TABLET TWICE DAILY 180 tablet 1   mometasone  (ELOCON ) 0.1 % cream Apply topically daily. 15 g 1   mupirocin  ointment (BACTROBAN ) 2 % SMARTSIG:sparingly Topical Daily     olmesartan  (BENICAR ) 20 MG tablet TAKE ONE TABLET ONCE DAILY 90 tablet 1   omega-3 acid ethyl esters (LOVAZA ) 1 g capsule 2 capsules Orally Twice a day     pregabalin  (LYRICA ) 75 MG capsule TAKE ONE CAPSULE THREE TIMES DAILY 270 capsule 1   pyridoxine  (B-6) 500 MG tablet Take 1 tablet (500 mg  total) by mouth daily. 30 tablet 3   Semaglutide  (RYBELSUS ) 14 MG TABS Take 1 tablet (14 mg total) by mouth daily. 90 tablet 3   UNABLE TO FIND Specialty bra s/p breast cancer surgery 1 Units 0   Vitamin D , Ergocalciferol , (DRISDOL ) 1.25 MG (50000 UNIT) CAPS capsule TAKE ONE CAPSULE ONCE WEEKLY 13 capsule 0   No current facility-administered medications for this visit.    LABS/IMAGING: No results found. However, due to the size of the patient record, not all encounters were searched. Please check Results Review for a  complete set of results. No results found.  VITALS: BP 116/74   Pulse 71   Ht 5' 2.5 (1.588 m)   Wt 178 lb 9.6 oz (81 kg)   SpO2 96%   BMI 32.15 kg/m   EXAM: General appearance: alert, no distress, and mildly obese Neck: no carotid bruit and no JVD Lungs: clear to auscultation bilaterally Heart: regular rate and rhythm, S1, S2 normal, no murmur, click, rub or gallop Abdomen: soft, non-tender; bowel sounds normal; no masses,  no organomegaly Extremities: edema 1-2+ bilateral and venous stasis dermatitis noted Pulses: 2+ and symmetric Skin: Skin color, texture, turgor normal. No rashes or lesions Neurologic: Grossly normal Psych: Pleasant  EKG: EKG Interpretation Date/Time:  Wednesday February 16 2024 15:31:20 EST Ventricular Rate:  71 PR Interval:  158 QRS Duration:  78 QT Interval:  420 QTC Calculation: 456 R Axis:   -85  Text Interpretation: Normal sinus rhythm Left axis deviation Low voltage QRS Inferior-posterior infarct (cited on or before 16-Feb-2024) Possible Anterolateral infarct , age undetermined When compared with ECG of 01-Mar-2023 14:02, Possible Anterior infarct is now Present Anterolateral infarct is now Present QT has lengthened Confirmed by Mona Kent [id number]) on 02/16/2024 3:43:43 PM    ASSESSMENT: Lower extremity edema Coronary artery bypass grafting x2 vessels with LIMA to LAD and SVG to PDA in 2007 - negative myoview  in 01/2016 Asymptomatic PVCs Hypertension-controlled Dyslipidemia - intolerant to all statins and zetia, markedly improved on Praluent  Diabetes Gout  Possible lipoma Breast cancer Peripheral neuropathy  PLAN: 1.   Ms. Honeycutt recently had a CT scan which showed some mild cardiomegaly.  Her EKG is abnormal showing inferior and anterolateral infarct patterns.  Previous echo in 2022 showed normal LVEF but an apical aneurysm or scar was noted.  She has had some worsening lower extremity edema and shortness of breath when walking up  stairs.  This might suggest some degree of cardiomyopathy.  She is on Lasix  20 mg daily.  I advise increasing that to 40 mg daily.  Will check a metabolic profile and BNP next week.  Plan also repeat echocardiogram.  Her swelling also could be related to neuropathy.  She denies any orthopnea but again has some dyspnea with exertion.  Plan follow-up after studies in about 2 to 3 months.  Kent KYM Mona, MD, [facility], FNLA, FACP  [place]  [facility]  Medical Director of the [facility]  Card[facility]ovascular Risk Reduction [facility] Diplomate of the American Board of Clinical Lipidology Attending Cardiologist  Direct Dial : [phone]  Fax: [phone]  Website:  [email or web address] Kent JAYSON Mona 02/16/2024, 3:43 PM

## 2024-02-23 ENCOUNTER — Other Ambulatory Visit: Payer: Self-pay | Admitting: Internal Medicine

## 2024-02-23 DIAGNOSIS — E1159 Type 2 diabetes mellitus with other circulatory complications: Secondary | ICD-10-CM

## 2024-02-23 DIAGNOSIS — R0609 Other forms of dyspnea: Secondary | ICD-10-CM

## 2024-02-23 DIAGNOSIS — I25810 Atherosclerosis of coronary artery bypass graft(s) without angina pectoris: Secondary | ICD-10-CM

## 2024-02-23 DIAGNOSIS — R600 Localized edema: Secondary | ICD-10-CM

## 2024-02-23 DIAGNOSIS — Z951 Presence of aortocoronary bypass graft: Secondary | ICD-10-CM

## 2024-02-23 DIAGNOSIS — M791 Myalgia, unspecified site: Secondary | ICD-10-CM

## 2024-03-01 ENCOUNTER — Ambulatory Visit: Payer: Self-pay | Admitting: Internal Medicine

## 2024-03-01 DIAGNOSIS — I152 Hypertension secondary to endocrine disorders: Secondary | ICD-10-CM

## 2024-03-01 DIAGNOSIS — Z951 Presence of aortocoronary bypass graft: Secondary | ICD-10-CM

## 2024-03-01 DIAGNOSIS — Z79899 Other long term (current) drug therapy: Secondary | ICD-10-CM

## 2024-03-01 LAB — BASIC METABOLIC PANEL WITH GFR
BUN/Creatinine Ratio: 22 (ref 12–28)
BUN: 46 mg/dL — ABNORMAL HIGH (ref 8–27)
CO2: 21 mmol/L (ref 20–29)
Calcium: 9.9 mg/dL (ref 8.7–10.3)
Chloride: 96 mmol/L (ref 96–106)
Creatinine, Ser: 2.1 mg/dL — ABNORMAL HIGH (ref 0.57–1.00)
Glucose: 190 mg/dL — ABNORMAL HIGH (ref 70–99)
Potassium: 4.4 mmol/L (ref 3.5–5.2)
Sodium: 135 mmol/L (ref 134–144)
eGFR: 23 mL/min/1.73 — ABNORMAL LOW (ref >59)

## 2024-03-01 LAB — BRAIN NATRIURETIC PEPTIDE: BNP: 33.3 pg/mL (ref 0.0–100.0)

## 2024-03-04 ENCOUNTER — Encounter: Payer: Self-pay | Admitting: Family

## 2024-03-06 ENCOUNTER — Telehealth: Payer: Self-pay | Admitting: Internal Medicine

## 2024-03-06 ENCOUNTER — Encounter: Payer: Self-pay | Admitting: Family

## 2024-03-06 NOTE — Telephone Encounter (Signed)
 PT received a letter from her insurance company stating she has been approved for  3d rendering w interp post processing supervision. PT wants to know what that means. Please advise.

## 2024-03-08 ENCOUNTER — Ambulatory Visit (HOSPITAL_COMMUNITY): Admission: RE | Admit: 2024-03-08 | Discharge: 2024-03-08 | Attending: Internal Medicine | Admitting: Internal Medicine

## 2024-03-08 DIAGNOSIS — I083 Combined rheumatic disorders of mitral, aortic and tricuspid valves: Secondary | ICD-10-CM | POA: Diagnosis not present

## 2024-03-08 DIAGNOSIS — E119 Type 2 diabetes mellitus without complications: Secondary | ICD-10-CM | POA: Insufficient documentation

## 2024-03-08 DIAGNOSIS — I10 Essential (primary) hypertension: Secondary | ICD-10-CM | POA: Diagnosis not present

## 2024-03-08 DIAGNOSIS — I2510 Atherosclerotic heart disease of native coronary artery without angina pectoris: Secondary | ICD-10-CM | POA: Insufficient documentation

## 2024-03-08 DIAGNOSIS — Z951 Presence of aortocoronary bypass graft: Secondary | ICD-10-CM | POA: Insufficient documentation

## 2024-03-08 DIAGNOSIS — I25810 Atherosclerosis of coronary artery bypass graft(s) without angina pectoris: Secondary | ICD-10-CM

## 2024-03-08 DIAGNOSIS — R0609 Other forms of dyspnea: Secondary | ICD-10-CM | POA: Diagnosis not present

## 2024-03-08 DIAGNOSIS — R600 Localized edema: Secondary | ICD-10-CM | POA: Insufficient documentation

## 2024-03-08 LAB — BASIC METABOLIC PANEL WITH GFR
BUN/Creatinine Ratio: 14 (ref 12–28)
BUN: 16 mg/dL (ref 8–27)
CO2: 21 mmol/L (ref 20–29)
Calcium: 10.1 mg/dL (ref 8.7–10.3)
Chloride: 103 mmol/L (ref 96–106)
Creatinine, Ser: 1.18 mg/dL — ABNORMAL HIGH (ref 0.57–1.00)
Glucose: 84 mg/dL (ref 70–99)
Potassium: 4.7 mmol/L (ref 3.5–5.2)
Sodium: 141 mmol/L (ref 134–144)
eGFR: 46 mL/min/1.73 — ABNORMAL LOW (ref >59)

## 2024-03-08 MED ORDER — PERFLUTREN LIPID MICROSPHERE
1.0000 mL | INTRAVENOUS | Status: AC | PRN
  Administered 2024-03-08: 11:00:00 4 mL via INTRAVENOUS

## 2024-03-09 LAB — ECHOCARDIOGRAM COMPLETE
Area-P 1/2: 3.65 cm2
S' Lateral: 3.3 cm

## 2024-03-13 ENCOUNTER — Ambulatory Visit: Payer: Self-pay | Admitting: Internal Medicine

## 2024-03-13 MED ORDER — FUROSEMIDE 20 MG PO TABS: 20.0000 mg | ORAL_TABLET | Freq: Every day | ORAL | Status: AC | PRN

## 2024-03-13 NOTE — Addendum Note (Signed)
 Addended by: LORING ANDRIETTE HERO on: 03/13/2024 03:18 PM   Modules accepted: Orders

## 2024-03-14 ENCOUNTER — Other Ambulatory Visit (HOSPITAL_COMMUNITY)

## 2024-03-14 ENCOUNTER — Ambulatory Visit (HOSPITAL_BASED_OUTPATIENT_CLINIC_OR_DEPARTMENT_OTHER)

## 2024-03-20 ENCOUNTER — Encounter: Payer: Self-pay | Admitting: Internal Medicine

## 2024-03-20 NOTE — Telephone Encounter (Signed)
 Spoke with patient regarding ECHO follow-up and medication adjustments, if needed. Pt already had appointment with Katlyn West, NP and additional appointment made for later with Dr. Mona (per pt request). Appointments sent to pt MyChart (also per pt request) with date and time for reminders. Pt verbalizes understanding of plan and all questions met at this time.

## 2024-03-24 ENCOUNTER — Inpatient Hospital Stay (HOSPITAL_BASED_OUTPATIENT_CLINIC_OR_DEPARTMENT_OTHER): Admitting: Hematology & Oncology

## 2024-03-24 ENCOUNTER — Inpatient Hospital Stay

## 2024-03-24 ENCOUNTER — Encounter: Payer: Self-pay | Admitting: Hematology & Oncology

## 2024-03-24 ENCOUNTER — Inpatient Hospital Stay: Attending: Hematology & Oncology

## 2024-03-24 VITALS — BP 138/52 | HR 71 | Temp 98.1°F | Resp 20 | Ht 63.0 in | Wt 179.0 lb

## 2024-03-24 DIAGNOSIS — C50912 Malignant neoplasm of unspecified site of left female breast: Secondary | ICD-10-CM

## 2024-03-24 DIAGNOSIS — Z853 Personal history of malignant neoplasm of breast: Secondary | ICD-10-CM | POA: Diagnosis not present

## 2024-03-24 DIAGNOSIS — Z9221 Personal history of antineoplastic chemotherapy: Secondary | ICD-10-CM | POA: Diagnosis not present

## 2024-03-24 LAB — CBC WITH DIFFERENTIAL (CANCER CENTER ONLY)
Abs Immature Granulocytes: 0.03 K/uL (ref 0.00–0.07)
Basophils Absolute: 0 K/uL (ref 0.0–0.1)
Basophils Relative: 0 %
Eosinophils Absolute: 0.3 K/uL (ref 0.0–0.5)
Eosinophils Relative: 3 %
HCT: 39.2 % (ref 36.0–46.0)
Hemoglobin: 13.5 g/dL (ref 12.0–15.0)
Immature Granulocytes: 0 %
Lymphocytes Relative: 29 %
Lymphs Abs: 2.3 K/uL (ref 0.7–4.0)
MCH: 31.9 pg (ref 26.0–34.0)
MCHC: 34.4 g/dL (ref 30.0–36.0)
MCV: 92.7 fL (ref 80.0–100.0)
Monocytes Absolute: 0.5 K/uL (ref 0.1–1.0)
Monocytes Relative: 6 %
Neutro Abs: 4.7 K/uL (ref 1.7–7.7)
Neutrophils Relative %: 62 %
Platelet Count: 135 K/uL — ABNORMAL LOW (ref 150–400)
RBC: 4.23 MIL/uL (ref 3.87–5.11)
RDW: 14 % (ref 11.5–15.5)
WBC Count: 7.8 K/uL (ref 4.0–10.5)
nRBC: 0 % (ref 0.0–0.2)

## 2024-03-24 LAB — CMP (CANCER CENTER ONLY)
ALT: 18 U/L (ref 0–44)
AST: 28 U/L (ref 15–41)
Albumin: 4.3 g/dL (ref 3.5–5.0)
Alkaline Phosphatase: 91 U/L (ref 38–126)
Anion gap: 14 (ref 5–15)
BUN: 26 mg/dL — ABNORMAL HIGH (ref 8–23)
CO2: 26 mmol/L (ref 22–32)
Calcium: 9.7 mg/dL (ref 8.9–10.3)
Chloride: 102 mmol/L (ref 98–111)
Creatinine: 1.25 mg/dL — ABNORMAL HIGH (ref 0.44–1.00)
GFR, Estimated: 43 mL/min — ABNORMAL LOW
Glucose, Bld: 170 mg/dL — ABNORMAL HIGH (ref 70–99)
Potassium: 3.9 mmol/L (ref 3.5–5.1)
Sodium: 142 mmol/L (ref 135–145)
Total Bilirubin: 0.4 mg/dL (ref 0.0–1.2)
Total Protein: 7 g/dL (ref 6.5–8.1)

## 2024-03-24 LAB — IRON AND IRON BINDING CAPACITY (CC-WL,HP ONLY)
Iron: 71 ug/dL (ref 28–170)
Saturation Ratios: 21 % (ref 10.4–31.8)
TIBC: 333 ug/dL (ref 250–450)
UIBC: 262 ug/dL

## 2024-03-24 LAB — FERRITIN: Ferritin: 60 ng/mL (ref 11–307)

## 2024-03-24 NOTE — Progress Notes (Signed)
 " Hematology and Oncology Follow Up Visit  Carmen [name] [id number] 1940-12-19 84 y.o. 03/24/2024   Principle Diagnosis:  Invasive ductal carcinoma of the left breast-stage IIA (T2N0M0) -- TRIPLE NEGATIVE -- s/p LEFT lobectomy on 01/14/2021 Hemochromatosis (double heterozygote for C282Y and S65C mutations).   Current Therapy:  Phlebotomy to maintain ferritin less than 100 Adriamycin /Cytoxan  - started adjuvant therapy on 02/27/2021, s/p cycle #6/6 -completed on 06/30/2021 XRT to the LEFT breast -- started on 08/06/2021 -- completed 09/23/2021   Interim History:  Carmen [name] is here today for follow-up.  Overall, everything is going okay for Carmen [name].  The only issue is that Carmen [name] has a decrease in Carmen [name] cardiac function.  Carmen [name] had echocardiogram that was done on 03/08/2024.  This showed that the left ventricle was down to 45-50%.  Carmen [name] is going to go back to the cardiologist to talk about this and see if Carmen [name] needs to be put on any type of inotropic agent.  Otherwise, Carmen [name] seems to be managing pretty well.  Carmen [name] had a wonderful Holiday season.  There is a big family get together in December.  Carmen [name] has had no problems with the hemochromatosis.  When we last saw Carmen [name], Carmen [name] ferritin was 72 with an iron saturation of 38%.  There has been no issues with respect to breast cancer.  There has been no evidence of any type of recurrent disease..  Carmen [name] did have a CT scan that was done.  This was done on 12/29/2023.  The CT scan did not show any evidence of recurrent/metastatic disease.  Of note, we did do the CT scan because Carmen [name] CA 27.29 was elevated at 54.  Overall, I would have to say that Carmen [name] performance status is probably ECOG 1.   Medications:  Allergies as of 03/24/2024       Reactions   Zofran  [ondansetron  Hcl] Other (See Comments)   Severe headache and abdominal pain   Cymbalta  [duloxetine  Hcl] Other (See Comments)   Profuse sweating   Farxiga  [dapagliflozin ] Other (See Comments)   Dizziness    Januvia  [sitagliptin ] Swelling   Meloxicam  Nausea And Vomiting   Shellfish Allergy Other (See Comments)   Gout   Trulicity  [dulaglutide ] Nausea Only   Celecoxib Other (See Comments)   Diclofenac  Diarrhea   Lipitor [atorvastatin] Other (See Comments)   Muscle aches   Livalo [pitavastatin] Other (See Comments)      Sulfa Antibiotics Rash   Tape Other (See Comments)   Causes skin irritation   Zetia [ezetimibe] Other (See Comments)   Muscle aches   Zocor [simvastatin] Other (See Comments)   Muscle aches         Medication List        Accurate as of March 24, 2024  3:22 PM. If you have any questions, ask your nurse or doctor.          Accu-Chek [employer] Kit Use to check Blood Sugars   allopurinol  100 MG tablet Commonly known as: ZYLOPRIM  TAKE ONE TABLET TWICE DAILY   aspirin 81 MG tablet Take 81 mg by mouth daily.   colchicine  0.6 MG tablet TAKE (1) TABLET DAILY AS NEEDED.   diclofenac  Sodium 1 % Gel Commonly known as: VOLTAREN  APPLY FOUR grams TO affected AREAS FOUR TIMES DAILY AS NEEDED   fluticasone  50 MCG/ACT nasal spray Commonly known as: FLONASE  Place 2 sprays into both nostrils daily.   furosemide  20 MG tablet Commonly known as: LASIX  Take 1 tablet (20 mg total) by mouth  daily as needed.   glipiZIDE  5 MG tablet Commonly known as: GLUCOTROL  Take 2 tablets (10 mg total) by mouth daily before breakfast AND 1 tablet (5 mg total) daily before supper.   glucose blood test strip Commonly known as: ONE TOUCH ULTRA TEST 1 each by Other route daily in the afternoon. CHECK BLOOD SUGAR 2 TIMES A DAY   icosapent  Ethyl 1 g capsule Commonly known as: Vascepa  Take 2 capsules (2 g total) by mouth 2 (two) times daily.   lidocaine -prilocaine  cream Commonly known as: EMLA  Apply 1 Application topically as needed.   Lovaza  1 g capsule Generic drug: omega-3 acid ethyl esters 2 capsules Orally Twice a day   meclizine  12.5 MG tablet Commonly known as:  ANTIVERT  Take 1 tablet (12.5 mg total) by mouth 3 (three) times daily as needed for dizziness.   metoprolol  tartrate 25 MG tablet Commonly known as: LOPRESSOR  TAKE ONE TABLET TWICE DAILY   mometasone  0.1 % cream Commonly known as: ELOCON  Apply topically daily.   mupirocin  ointment 2 % Commonly known as: BACTROBAN  SMARTSIG:sparingly Topical Daily   olmesartan  20 MG tablet Commonly known as: BENICAR  TAKE ONE TABLET ONCE DAILY   onetouch ultrasoft lancets 1 each by Other route daily in the afternoon. Use as instructed   Praluent  150 MG/ML Soaj Generic drug: Alirocumab  Inject 1 Dose into the skin every 14 (fourteen) days.   pregabalin  75 MG capsule Commonly known as: LYRICA  TAKE ONE CAPSULE THREE TIMES DAILY   pyridoxine  500 MG tablet Commonly known as: B-6 Take 1 tablet (500 mg total) by mouth daily.   Rybelsus  14 MG Tabs Generic drug: Semaglutide  Take 1 tablet (14 mg total) by mouth daily.   UNABLE TO FIND Specialty bra s/p breast cancer surgery   Vitamin D  (Ergocalciferol ) 1.25 MG (50000 UNIT) Caps capsule Commonly known as: DRISDOL  TAKE ONE CAPSULE ONCE WEEKLY        Allergies:  Allergies  Allergen Reactions   Zofran  [Ondansetron  Hcl] Other (See Comments)    Severe headache and abdominal pain   Cymbalta  [Duloxetine  Hcl] Other (See Comments)    Profuse sweating   Farxiga  [Dapagliflozin ] Other (See Comments)    Dizziness   Januvia  [Sitagliptin ] Swelling   Meloxicam  Nausea And Vomiting   Shellfish Allergy Other (See Comments)    Gout   Trulicity  [Dulaglutide ] Nausea Only   Celecoxib Other (See Comments)   Diclofenac  Diarrhea   Lipitor [Atorvastatin] Other (See Comments)    Muscle aches   Livalo [Pitavastatin] Other (See Comments)        Sulfa Antibiotics Rash   Tape Other (See Comments)    Causes skin irritation   Zetia [Ezetimibe] Other (See Comments)    Muscle aches   Zocor [Simvastatin] Other (See Comments)    Muscle aches     Past  Medical History, Surgical history, Social history, and Family History were reviewed and updated.  Review of Systems: Review of Systems  Constitutional: Negative.   HENT:  Positive for sore throat.   Eyes: Negative.   Respiratory: Negative.    Cardiovascular: Negative.   Gastrointestinal: Negative.   Genitourinary: Negative.   Musculoskeletal: Negative.   Skin: Negative.   Neurological: Negative.   Endo/Heme/Allergies: Negative.   Psychiatric/Behavioral: Negative.       Physical Exam:  Vital signs show temperature of 98.1.  Pulse 71.  Blood pressure 130/52.  Weight is 179 pounds.   Wt Readings from Last 3 Encounters:  03/24/24 179 lb (81.2 kg)  02/16/24 178 lb  9.6 oz (81 kg)  02/09/24 177 lb (80.3 kg)    Physical Exam Vitals reviewed.  HENT:     Head: Normocephalic and atraumatic.  Eyes:     Pupils: Pupils are equal, round, and reactive to light.  Cardiovascular:     Rate and Rhythm: Normal rate and regular rhythm.     Heart sounds: Normal heart sounds.  Pulmonary:     Effort: Pulmonary effort is normal.     Breath sounds: Normal breath sounds.  Abdominal:     General: Bowel sounds are normal.     Palpations: Abdomen is soft.  Musculoskeletal:        General: No tenderness or deformity. Normal range of motion.     Cervical back: Normal range of motion.  Lymphadenopathy:     Cervical: No cervical adenopathy.  Skin:    General: Skin is warm and dry.     Findings: No erythema or rash.  Neurological:     Mental Status: Carmen [name] is alert and oriented to person, place, and time.  Psychiatric:        Behavior: Behavior normal.        Thought Content: Thought content normal.        Judgment: Judgment normal.      Lab Results  Component Value Date   WBC 7.8 03/24/2024   HGB 13.5 03/24/2024   HCT 39.2 03/24/2024   MCV 92.7 03/24/2024   PLT 135 (L) 03/24/2024   Lab Results  Component Value Date   FERRITIN 72 12/22/2023   IRON 128 12/22/2023   TIBC 335  12/22/2023   UIBC 207 12/22/2023   IRONPCTSAT 38 (H) 12/22/2023   Lab Results  Component Value Date   RETICCTPCT 1.3 08/20/2023   RBC 4.23 03/24/2024   No results found for: KPAFRELGTCHN, LAMBDASER, KAPLAMBRATIO No results found for: KIMBERLY LE, IGMSERUM No results found for: STEPHANY CARLOTA BENSON MARKEL EARLA JOANNIE DOC VICK, SPEI   Chemistry      Component Value Date/Time   NA 141 03/08/2024 1130   NA 147 (H) 01/22/2017 1451   NA 140 12/31/2015 1052   K 4.7 03/08/2024 1130   K 4.9 (H) 01/22/2017 1451   K 4.2 12/31/2015 1052   CL 103 03/08/2024 1130   CL 106 01/22/2017 1451   CO2 21 03/08/2024 1130   CO2 29 01/22/2017 1451   CO2 23 12/31/2015 1052   BUN 16 03/08/2024 1130   BUN 23 (H) 01/22/2017 1451   BUN 21.4 12/31/2015 1052   CREATININE 1.18 (H) 03/08/2024 1130   CREATININE 1.14 (H) 12/22/2023 1347   CREATININE 1.6 (H) 01/22/2017 1451   CREATININE 1.4 (H) 12/31/2015 1052      Component Value Date/Time   CALCIUM  10.1 03/08/2024 1130   CALCIUM  10.3 01/22/2017 1451   CALCIUM  9.7 12/31/2015 1052   ALKPHOS 91 12/22/2023 1347   ALKPHOS 65 01/22/2017 1451   ALKPHOS 87 12/31/2015 1052   AST 25 12/22/2023 1347   AST 31 12/31/2015 1052   ALT 17 12/22/2023 1347   ALT 25 01/22/2017 1451   ALT 36 12/31/2015 1052   BILITOT 0.5 12/22/2023 1347   BILITOT 0.42 12/31/2015 1052       Impression and Plan: Carmen [name] is a very pleasant 84 yo caucasian female with stage IIA infiltrating ductal carcinoma of the left breast, triple negative.   Carmen [name] completed Carmen [name] adjuvant chemotherapy with A/C in April 2023.  I hate that Carmen [name] has this cardiac dysfunction.  I had  to say that I suspect this probably is from all of Carmen [name] past treatments.  I do not think this is anything related to Carmen [name] having hemochromatosis since this is under good control.  It will be interesting to see what Carmen [name] has put on to try to help with Carmen [name] cardiac  function.  Again, I do not see any evidence of recurrent breast cancer.  We will have to see what the CA 27.29 is.  I would like to see Carmen [name] back probably in about 4 months or so.  1/2/20263:22 PM  "

## 2024-03-24 NOTE — Patient Instructions (Signed)

## 2024-03-29 ENCOUNTER — Ambulatory Visit (HOSPITAL_COMMUNITY)

## 2024-03-29 ENCOUNTER — Telehealth: Payer: Self-pay | Admitting: Family Medicine

## 2024-03-29 DIAGNOSIS — I152 Hypertension secondary to endocrine disorders: Secondary | ICD-10-CM

## 2024-03-29 DIAGNOSIS — Z0120 Encounter for dental examination and cleaning without abnormal findings: Secondary | ICD-10-CM

## 2024-03-29 DIAGNOSIS — C50912 Malignant neoplasm of unspecified site of left female breast: Secondary | ICD-10-CM

## 2024-03-29 DIAGNOSIS — C50512 Malignant neoplasm of lower-outer quadrant of left female breast: Secondary | ICD-10-CM

## 2024-03-29 DIAGNOSIS — E119 Type 2 diabetes mellitus without complications: Secondary | ICD-10-CM

## 2024-03-29 DIAGNOSIS — E1140 Type 2 diabetes mellitus with diabetic neuropathy, unspecified: Secondary | ICD-10-CM

## 2024-03-29 DIAGNOSIS — N1832 Chronic kidney disease, stage 3b: Secondary | ICD-10-CM

## 2024-03-29 NOTE — Telephone Encounter (Signed)
 Pt has [facility] Medicare and needs a referral to the following doctors that she sees regularly she called [facility] Medicare and was told that the referral can be made yearly.  1. [facility] ph# [phone] Dr. Maude Halt  2. [facility] Dr. Vinie Maxcy she has appt on 04/06/2024 with Katlyn West ph# [phone]  3. Dr. Donell Butts Endocrinology [phone]  4. Dr. Velma Boehringer foot doctor [phone]  5. Dr. Rolan Molt Dermatology [phone]  6. [facility] for mammogram ph# [phone]   7. [facility] [place] [phone] Dr. Vicci   8. [facility] for glasses [phone]  9. [facility] for Eye Exam [phone]  10. [facility] [phone]. Dr. Deward Null Surgeon [phone]

## 2024-03-30 NOTE — Telephone Encounter (Signed)
 Will pcp order new referrals? Thank you.

## 2024-03-30 NOTE — Telephone Encounter (Signed)
 Yes that is fine to go ahead and place these referrals, I do not have a diagnosis for each of these but you may have to look in Dr. Rosina chart to get the diagnoses.

## 2024-03-30 NOTE — Telephone Encounter (Signed)
"  Referrals submitted   "

## 2024-03-30 NOTE — Addendum Note (Signed)
 Addended by: MILAS CONGRESS D on: 03/30/2024 01:59 PM   Modules accepted: Orders

## 2024-03-30 NOTE — Telephone Encounter (Signed)
 PCP is out of the office until 04/10/2024.  Covering provider, is it okay to order these referrals for patient? Its for the new rule with [facility] that patients now have to have referrals for every office they see.

## 2024-03-31 NOTE — Addendum Note (Signed)
 Addended by: MILAS CONGRESS D on: 03/31/2024 12:22 PM   Modules accepted: Orders

## 2024-04-03 ENCOUNTER — Other Ambulatory Visit: Payer: Self-pay

## 2024-04-03 ENCOUNTER — Telehealth: Payer: Self-pay | Admitting: Family Medicine

## 2024-04-03 DIAGNOSIS — Z1283 Encounter for screening for malignant neoplasm of skin: Secondary | ICD-10-CM

## 2024-04-03 NOTE — Telephone Encounter (Signed)
 Copied from CRM [id number]. Topic: Clinical - Medication Prior Auth >> Apr 03, 2024 10:55 AM Alfonso ORN wrote: Reason for CRM: Alisa with  [facility] [phone] )  Patient need a prior authorization for the referral and need the correct provider name put in  the wrong one is  Dr. Bard Molt the correct provider name is   Dr. Toribio Molt and his npi [phone]

## 2024-04-03 NOTE — Telephone Encounter (Signed)
 Referral completed

## 2024-04-05 NOTE — Progress Notes (Signed)
 "  Cardiology Office Note    Date:  04/06/2024  ID:  REMY DIA, DOB 1940/08/04, MRN [id number] PCP:  Zollie Lowers, MD  Cardiologist:  Vinie JAYSON Maxcy, MD  Electrophysiologist:  None   Chief Complaint: Follow up for HF with mildly reduced EF   History of Present Illness: .    JOLIENE SALVADOR is a 84 y.o. female with visit-pertinent history of CAD s/p CABG in 2007, type II DM, hyperlipidemia with history of statin intolerance, PVCs, hypertension.  In 2007 patient underwent bypass surgery, at that time had a 40% ejection fraction.  Nuclear study in May 2012 was low restudy, was unable to calculate ejection fraction as a result of ectopy.  MPI in 2017 was low risk.  Echocardiogram in 2022 indicated LVEF 55%, apex akinetic with pattern of apical ballooning, G1 DD, RV systolic function size is normal, no evidence of mitral regurgitation, no evidence of stenosis, aortic valve regurgitation not visualized, no stenosis was present.  Patient was last seen in clinic on 02/16/2024 by Dr. Maxcy.  Patient noted worsening lower extremity edema as well as shortness of breath.  She denied any chest pain.  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  Echocardiogram was ordered for further evaluation.  Echocardiogram 112/17/25 indicated LVEF 45 to 50%, wall motion abnormalities are present, G1 DD, RV systolic function and size was normal, mild mitral regurgitation, no evidence of stenosis, aortic valve regurgitation not visualized, sclerosis present without evidence of stenosis.  Today she presents for follow-up with her daughter.  She reports that she has been doing very well overall.  She denies any chest pain, increased shortness of breath, orthopnea or PND.  She notes that she has stable bilateral ankle edema, notes that she will take her Lasix  at home with improvement.  She denies any significant changes.   She denies any palpitations, presyncope or syncope.  Patient reports that overall she has been feeling very well. ROS: .   Today she denies chest pain, shortness of breath, palpitations, melena, hematuria, hemoptysis, diaphoresis, weakness, presyncope, syncope, orthopnea, and PND.  All other systems are reviewed and otherwise negative. Studies Reviewed: SABRA   EKG:  EKG is not ordered today.  CV Studies: Cardiac studies reviewed are outlined and summarized above. Otherwise please see EMR for full report. Cardiac Studies & Procedures   ______________________________________________________________________________________________   STRESS TESTS  MYOCARDIAL PERFUSION IMAGING 02/05/2016  Interpretation Summary  Nuclear stress EF: 67%.  The left ventricular ejection fraction is hyperdynamic (>65%).  There was no ST segment deviation noted during stress.  The study is normal.  This is a low risk study.   ECHOCARDIOGRAM  ECHOCARDIOGRAM COMPLETE 03/08/2024  Narrative ECHOCARDIOGRAM REPORT    Patient Name:   TKAI LARGE Date of Exam: 03/08/2024 Medical Rec #:  [id number]          Height:       62.5 in Accession #:    [id number]         Weight:       178.6 lb Date of Birth:  Dec 25, 1940          BSA:          1.833 m Patient Age:    83 years           BP:           148/78 mmHg Patient Gender: F  HR:           65 bpm. Exam Location:  Outpatient  Procedure: 2D Echo, Cardiac Doppler, Color Doppler and Intracardiac Opacification Agent (Both Spectral and Color Flow Doppler were utilized during procedure).  Indications:    Bilateral lower extremity edema  History:        Patient has prior history of Echocardiogram examinations. CAD, Prior CABG; Risk Factors:Hypertension and Diabetes.  Sonographer:    Merlynn Argyle Referring Phys: [phone] KENNETH C HILTY  IMPRESSIONS   1. Left ventricular ejection fraction, by estimation, is 45 to 50%. The left ventricle has  mildly decreased function. The left ventricle demonstrates regional wall motion abnormalities (see scoring diagram/findings for description). Left ventricular diastolic parameters are consistent with Grade I diastolic dysfunction (impaired relaxation). 2. Right ventricular systolic function is normal. The right ventricular size is normal. There is normal pulmonary artery systolic pressure. 3. The mitral valve is degenerative. Mild mitral valve regurgitation. No evidence of mitral stenosis. 4. The tricuspid valve is degenerative. 5. The aortic valve is tricuspid. Aortic valve regurgitation is not visualized. Aortic valve sclerosis is present, with no evidence of aortic valve stenosis. 6. The inferior vena cava is normal in size with greater than 50% respiratory variability, suggesting right atrial pressure of 3 mmHg.  Comparison(s): A prior study was performed on 02/17/2021. LVEF 55%, apex akinetic, Grade I diastolic dysfunction, mild LAE.  Conclusion(s)/Recommendation(s): No left ventricular mural or apical thrombus/thrombi.  FINDINGS Left Ventricle: Left ventricular ejection fraction, by estimation, is 45 to 50%. The left ventricle has mildly decreased function. The left ventricle demonstrates regional wall motion abnormalities. Definity  contrast agent was given IV to delineate the left ventricular endocardial borders. The left ventricular internal cavity size was normal in size. There is no left ventricular hypertrophy. Left ventricular diastolic parameters are consistent with Grade I diastolic dysfunction (impaired relaxation).   LV Wall Scoring: The apex is dyskinetic. The entire septum is hypokinetic.  Right Ventricle: The right ventricular size is normal. No increase in right ventricular wall thickness. Right ventricular systolic function is normal. There is normal pulmonary artery systolic pressure. The tricuspid regurgitant velocity is 2.64 m/s, and with an assumed right atrial pressure  of 3 mmHg, the estimated right ventricular systolic pressure is 30.9 mmHg.  Left Atrium: Left atrial size was normal in size.  Right Atrium: Right atrial size was normal in size.  Pericardium: There is no evidence of pericardial effusion.  Mitral Valve: The mitral valve is degenerative in appearance. Mild mitral valve regurgitation. No evidence of mitral valve stenosis.  Tricuspid Valve: The tricuspid valve is degenerative in appearance. Tricuspid valve regurgitation is mild . No evidence of tricuspid stenosis.  Aortic Valve: The aortic valve is tricuspid. Aortic valve regurgitation is not visualized. Aortic valve sclerosis is present, with no evidence of aortic valve stenosis.  Pulmonic Valve: The pulmonic valve was grossly normal. Pulmonic valve regurgitation is trivial. No evidence of pulmonic stenosis.  Aorta: The aortic root and ascending aorta are structurally normal, with no evidence of dilitation.  Venous: The inferior vena cava is normal in size with greater than 50% respiratory variability, suggesting right atrial pressure of 3 mmHg.  IAS/Shunts: The atrial septum is grossly normal.   LEFT VENTRICLE PLAX 2D LVIDd:         4.50 cm   Diastology LVIDs:         3.30 cm   LV e' medial:    8.92 cm/s LV PW:  0.90 cm   LV E/e' medial:  12.1 LV IVS:        0.90 cm   LV e' lateral:   8.92 cm/s LVOT diam:     1.80 cm   LV E/e' lateral: 12.1 LV SV:         72 LV SV Index:   39 LVOT Area:     2.54 cm LV IVRT:       70 msec   RIGHT VENTRICLE            IVC RV Basal diam:  2.90 cm    IVC diam: 1.30 cm RV S prime:     9.14 cm/s TAPSE (M-mode): 1.6 cm     PULMONARY VEINS Diastolic Velocity: 48.80 cm/s S/D Velocity:       1.40 Systolic Velocity:  69.40 cm/s  LEFT ATRIUM             Index        RIGHT ATRIUM           Index LA diam:        3.70 cm 2.02 cm/m   RA Area:     13.40 cm LA Vol (A2C):   41.8 ml 22.81 ml/m  RA Volume:   30.85 ml  16.83 ml/m LA Vol (A4C):    32.9 ml 17.95 ml/m LA Biplane Vol: 38.9 ml 21.22 ml/m AORTIC VALVE LVOT Vmax:   120.00 cm/s LVOT Vmean:  83.300 cm/s LVOT VTI:    0.283 m  AORTA Ao Root diam: 2.60 cm Ao Asc diam:  3.70 cm  MITRAL VALVE                TRICUSPID VALVE MV Area (PHT): 3.65 cm     TR Peak grad:   27.9 mmHg MV Decel Time: 208 msec     TR Vmax:        264.00 cm/s MV E velocity: 108.00 cm/s MV A velocity: 131.00 cm/s  SHUNTS MV E/A ratio:  0.82         Systemic VTI:  0.28 m Systemic Diam: 1.80 cm  Sunit Tolia Electronically signed by Madonna Large Signature Date/Time: 03/09/2024/12:06:47 PM    Final          ______________________________________________________________________________________________       Current Reported Medications:.    Active Medications[1]  Physical Exam:    VS:  BP (!) 118/58   Pulse 64   Ht 5' 3 (1.6 m)   Wt 175 lb (79.4 kg)   SpO2 95%   BMI 31.00 kg/m    Wt Readings from Last 3 Encounters:  04/06/24 175 lb (79.4 kg)  03/24/24 179 lb (81.2 kg)  02/16/24 178 lb 9.6 oz (81 kg)    GEN: Well nourished, well developed in no acute distress NECK: No JVD; No carotid bruits CARDIAC: RRR, no murmurs, rubs, gallops RESPIRATORY:  Clear to auscultation without rales, wheezing or rhonchi  ABDOMEN: Soft, non-tender, non-distended EXTREMITIES:  No edema; No acute deformity     Asessement and Plan:.    HF mildly reduced EF: EF at time of bypass surgery in 2007 was 40%.  Echo in 2022 indicated EF 55%, apex akinetic with pattern of apical ballooning.  Most recent echocardiogram in 02/2024 in setting of increase lower extremity edema and shortness of breath indicated LVEF 45 to 50%. Today she denies increased shortness of breath, orthopnea or PND.  She notes some mild bilateral ankle edema, denies any significant changes.  She reports  that this has been present for quite a few months, does not feel it has been worsening.  On exam she has mild bilateral ankle edema,  otherwise appears euvolemic and well compensated.  Reviewed patient's echocardiogram results, she denies any chest pain or shortness of breath, she questions if decline in EF is related to recent chemotherapy and radiation.  Discussed escalating GDMT, patient agreeable.  She will stop olmesartan  and start Entresto  24-26 mg twice daily.  Recent lab work stable, check basic metabolic profile in 2 weeks.  Of note patient previously on Farxiga  resulting in significant dizziness.  Continue metoprolol  to tartrate 25 mg twice daily.  CAD: S/p CABG in 2007. Stable with no anginal symptoms. No indication for ischemic evaluation.  Heart healthy diet and regular cardiovascular exercise encouraged.  Reviewed ED precautions.  Continue aspirin 81 mg daily, Praluent , Lasix , metoprolol .  Hypertension: Blood pressure today 118/58.  Patient to discontinue olmesartan  and start Entresto  as noted above.  Hyperlipidemia: Last lipid profile indicated LDL 69.  Continue Praluent .    Disposition: F/u with Dr. Mona in 05/2024 as scheduled.   Signed, Thelonious Kauffmann D Netra Postlethwait, NP       [1]  Current Meds  Medication Sig   Alirocumab  (PRALUENT ) 150 MG/ML SOAJ Inject 1 Dose into the skin every 14 (fourteen) days.   allopurinol  (ZYLOPRIM ) 100 MG tablet TAKE ONE TABLET TWICE DAILY   aspirin 81 MG tablet Take 81 mg by mouth daily.   colchicine  0.6 MG tablet TAKE (1) TABLET DAILY AS NEEDED.   diclofenac  Sodium (VOLTAREN ) 1 % GEL APPLY FOUR grams TO affected AREAS FOUR TIMES DAILY AS NEEDED   fluticasone  (FLONASE ) 50 MCG/ACT nasal spray Place 2 sprays into both nostrils daily.   furosemide  (LASIX ) 20 MG tablet Take 1 tablet (20 mg total) by mouth daily as needed.   glipiZIDE  (GLUCOTROL ) 5 MG tablet Take 2 tablets (10 mg total) by mouth daily before breakfast AND 1 tablet (5 mg total) daily before supper.   glucose blood (ONE TOUCH ULTRA TEST) test strip 1 each by Other route daily in the afternoon. CHECK BLOOD SUGAR 2 TIMES A DAY    icosapent  Ethyl (VASCEPA ) 1 g capsule Take 2 capsules (2 g total) by mouth 2 (two) times daily.   Lancets (ONETOUCH ULTRASOFT) lancets 1 each by Other route daily in the afternoon. Use as instructed   Lancets Misc. (ACCU-CHEK FASTCLIX LANCET) KIT Use to check Blood Sugars   lidocaine -prilocaine  (EMLA ) cream Apply 1 Application topically as needed.   meclizine  (ANTIVERT ) 12.5 MG tablet Take 1 tablet (12.5 mg total) by mouth 3 (three) times daily as needed for dizziness.   metoprolol  tartrate (LOPRESSOR ) 25 MG tablet TAKE ONE TABLET TWICE DAILY   mometasone  (ELOCON ) 0.1 % cream Apply topically daily.   mupirocin  ointment (BACTROBAN ) 2 % SMARTSIG:sparingly Topical Daily   omega-3 acid ethyl esters (LOVAZA ) 1 g capsule 2 capsules Orally Twice a day   pregabalin  (LYRICA ) 75 MG capsule TAKE ONE CAPSULE THREE TIMES DAILY   pyridoxine  (B-6) 500 MG tablet Take 1 tablet (500 mg total) by mouth daily.   sacubitril -valsartan  (ENTRESTO ) 24-26 MG Take 1 tablet by mouth 2 (two) times daily.   Semaglutide  (RYBELSUS ) 14 MG TABS Take 1 tablet (14 mg total) by mouth daily.   UNABLE TO FIND Specialty bra s/p breast cancer surgery   Vitamin D , Ergocalciferol , (DRISDOL ) 1.25 MG (50000 UNIT) CAPS capsule TAKE ONE CAPSULE ONCE WEEKLY   [DISCONTINUED] olmesartan  (BENICAR ) 20 MG tablet TAKE ONE TABLET  ONCE DAILY   "

## 2024-04-06 ENCOUNTER — Encounter: Payer: Self-pay | Admitting: Cardiology

## 2024-04-06 ENCOUNTER — Ambulatory Visit: Attending: Cardiology | Admitting: Cardiology

## 2024-04-06 VITALS — BP 118/58 | HR 64 | Ht 63.0 in | Wt 175.0 lb

## 2024-04-06 DIAGNOSIS — I152 Hypertension secondary to endocrine disorders: Secondary | ICD-10-CM | POA: Diagnosis not present

## 2024-04-06 DIAGNOSIS — Z951 Presence of aortocoronary bypass graft: Secondary | ICD-10-CM | POA: Diagnosis not present

## 2024-04-06 DIAGNOSIS — T466X5D Adverse effect of antihyperlipidemic and antiarteriosclerotic drugs, subsequent encounter: Secondary | ICD-10-CM

## 2024-04-06 DIAGNOSIS — I5020 Unspecified systolic (congestive) heart failure: Secondary | ICD-10-CM

## 2024-04-06 DIAGNOSIS — E1159 Type 2 diabetes mellitus with other circulatory complications: Secondary | ICD-10-CM | POA: Diagnosis not present

## 2024-04-06 DIAGNOSIS — T466X5A Adverse effect of antihyperlipidemic and antiarteriosclerotic drugs, initial encounter: Secondary | ICD-10-CM

## 2024-04-06 DIAGNOSIS — I25810 Atherosclerosis of coronary artery bypass graft(s) without angina pectoris: Secondary | ICD-10-CM | POA: Diagnosis not present

## 2024-04-06 DIAGNOSIS — M791 Myalgia, unspecified site: Secondary | ICD-10-CM

## 2024-04-06 MED ORDER — SACUBITRIL-VALSARTAN 24-26 MG PO TABS: 1.0000 | ORAL_TABLET | Freq: Two times a day (BID) | ORAL | 6 refills | Status: AC

## 2024-04-06 NOTE — Patient Instructions (Signed)
 Medication Instructions:   START TAKING:   ENTRESTO   24-26   TWICE   A DAY    STOP TAKING AND REMOVE THIS MEDICATION FROM YOUR MEDICATION LIST:  STOP   OLMESARTAN    *If you need a refill on your cardiac medications before your next appointment, please call your pharmacy*   Lab Work:    PLEASE GO DOWN STAIRS  [facility]  FIRST FLOOR   ( GET OFF ELEVATORS WALK TOWARDS WAITING AREA LAB LOCATED BY PHARMACY):  BMET TODAY         If you have labs (blood work) drawn today and your tests are completely normal, you will receive your results only by: MyChart Message (if you have MyChart) OR A paper copy in the mail If you have any lab test that is abnormal or we need to change your treatment, we will call you to review the results.  Testing/Procedures: NONE ORDERED  TODAY     Follow-Up: At [facility], you and your health needs are our priority.  As part of our continuing mission to provide you with exceptional heart care, our providers are all part of one team.  This team includes your primary Cardiologist (physician) and Advanced Practice Providers or APPs (Physician Assistants and Nurse Practitioners) who all work together to provide you with the care you need, when you need it.  Your next appointment:   AS SCHEDULED   We recommend signing up for the patient portal called MyChart.  Sign up information is provided on this After Visit Summary.  MyChart is used to connect with patients for Virtual Visits (Telemedicine).  Patients are able to view lab/test results, encounter notes, upcoming appointments, etc.  Non-urgent messages can be sent to your provider as well.   To learn more about what you can do with MyChart, go to [email or web address].   Other Instructions

## 2024-04-21 LAB — BASIC METABOLIC PANEL WITH GFR
BUN/Creatinine Ratio: 21 (ref 12–28)
BUN: 30 mg/dL — ABNORMAL HIGH (ref 8–27)
CO2: 24 mmol/L (ref 20–29)
Calcium: 9.9 mg/dL (ref 8.7–10.3)
Chloride: 102 mmol/L (ref 96–106)
Creatinine, Ser: 1.4 mg/dL — ABNORMAL HIGH (ref 0.57–1.00)
Glucose: 129 mg/dL — ABNORMAL HIGH (ref 70–99)
Potassium: 4.2 mmol/L (ref 3.5–5.2)
Sodium: 142 mmol/L (ref 134–144)
eGFR: 37 mL/min/{1.73_m2} — ABNORMAL LOW

## 2024-04-24 ENCOUNTER — Ambulatory Visit: Payer: Self-pay | Admitting: Cardiology

## 2024-04-24 DIAGNOSIS — Z79899 Other long term (current) drug therapy: Secondary | ICD-10-CM

## 2024-04-27 ENCOUNTER — Ambulatory Visit: Payer: Medicare Other

## 2024-06-01 ENCOUNTER — Ambulatory Visit: Payer: Self-pay | Admitting: Family Medicine

## 2024-06-09 ENCOUNTER — Inpatient Hospital Stay

## 2024-06-15 ENCOUNTER — Ambulatory Visit: Admitting: Internal Medicine

## 2024-07-21 ENCOUNTER — Inpatient Hospital Stay: Admitting: Hematology & Oncology

## 2024-07-21 ENCOUNTER — Inpatient Hospital Stay

## 2024-08-08 ENCOUNTER — Ambulatory Visit: Admitting: Internal Medicine
